# Patient Record
Sex: Male | Born: 1945
Health system: Southern US, Community
[De-identification: ages and names within clinical notes are randomized; demographics above are authoritative.]

## PROBLEM LIST (undated history)

## (undated) DIAGNOSIS — F329 Major depressive disorder, single episode, unspecified: Secondary | ICD-10-CM

## (undated) DIAGNOSIS — K635 Polyp of colon: Secondary | ICD-10-CM

## (undated) DIAGNOSIS — G4730 Sleep apnea, unspecified: Secondary | ICD-10-CM

## (undated) DIAGNOSIS — K5790 Diverticulosis of intestine, part unspecified, without perforation or abscess without bleeding: Secondary | ICD-10-CM

## (undated) DIAGNOSIS — I872 Venous insufficiency (chronic) (peripheral): Secondary | ICD-10-CM

## (undated) DIAGNOSIS — F32A Depression, unspecified: Secondary | ICD-10-CM

## (undated) DIAGNOSIS — I10 Essential (primary) hypertension: Secondary | ICD-10-CM

## (undated) DIAGNOSIS — I890 Lymphedema, not elsewhere classified: Secondary | ICD-10-CM

## (undated) DIAGNOSIS — I8390 Asymptomatic varicose veins of unspecified lower extremity: Secondary | ICD-10-CM

## (undated) DIAGNOSIS — D649 Anemia, unspecified: Secondary | ICD-10-CM

## (undated) DIAGNOSIS — K529 Noninfective gastroenteritis and colitis, unspecified: Secondary | ICD-10-CM

## (undated) DIAGNOSIS — K2970 Gastritis, unspecified, without bleeding: Secondary | ICD-10-CM

## (undated) DIAGNOSIS — K219 Gastro-esophageal reflux disease without esophagitis: Secondary | ICD-10-CM

## (undated) DIAGNOSIS — M1990 Unspecified osteoarthritis, unspecified site: Secondary | ICD-10-CM

## (undated) DIAGNOSIS — F250 Schizoaffective disorder, bipolar type: Secondary | ICD-10-CM

## (undated) DIAGNOSIS — M879 Osteonecrosis, unspecified: Secondary | ICD-10-CM

## (undated) DIAGNOSIS — M545 Low back pain, unspecified: Secondary | ICD-10-CM

## (undated) DIAGNOSIS — Z8719 Personal history of other diseases of the digestive system: Secondary | ICD-10-CM

## (undated) DIAGNOSIS — E611 Iron deficiency: Secondary | ICD-10-CM

## (undated) DIAGNOSIS — I82419 Acute embolism and thrombosis of unspecified femoral vein: Secondary | ICD-10-CM

## (undated) DIAGNOSIS — C801 Malignant (primary) neoplasm, unspecified: Secondary | ICD-10-CM

## (undated) DIAGNOSIS — E785 Hyperlipidemia, unspecified: Secondary | ICD-10-CM

## (undated) HISTORY — DX: Noninfective gastroenteritis and colitis, unspecified: K52.9

## (undated) HISTORY — DX: Personal history of other diseases of the digestive system: Z87.19

## (undated) HISTORY — DX: Sleep apnea, unspecified: G47.30

## (undated) HISTORY — DX: Major depressive disorder, single episode, unspecified: F32.9

## (undated) HISTORY — PX: TOTAL KNEE ARTHROPLASTY: SHX125

## (undated) HISTORY — DX: Low back pain, unspecified: M54.50

## (undated) HISTORY — DX: Gastro-esophageal reflux disease without esophagitis: K21.9

## (undated) HISTORY — DX: Depression, unspecified: F32.A

## (undated) HISTORY — DX: Unspecified osteoarthritis, unspecified site: M19.90

## (undated) HISTORY — DX: Anemia, unspecified: D64.9

## (undated) HISTORY — DX: Diverticulosis of intestine, part unspecified, without perforation or abscess without bleeding: K57.90

## (undated) HISTORY — DX: Osteonecrosis, unspecified: M87.9

## (undated) HISTORY — DX: Iron deficiency: E61.1

## (undated) HISTORY — PX: BLADDER SURGERY: SHX569

## (undated) HISTORY — DX: Acute embolism and thrombosis of unspecified femoral vein: I82.419

## (undated) HISTORY — DX: Polyp of colon: K63.5

## (undated) HISTORY — DX: Low back pain: M54.5

## (undated) HISTORY — DX: Schizoaffective disorder, bipolar type: F25.0

## (undated) HISTORY — DX: Gastritis, unspecified, without bleeding: K29.70

## (undated) HISTORY — DX: Hyperlipidemia, unspecified: E78.5

## (undated) HISTORY — DX: Essential (primary) hypertension: I10

## (undated) HISTORY — PX: VARICOSE VEIN SURGERY: SHX832

---

## 1998-06-02 ENCOUNTER — Encounter: Payer: Self-pay | Admitting: Urology

## 1998-06-05 ENCOUNTER — Ambulatory Visit (HOSPITAL_COMMUNITY): Admission: RE | Admit: 1998-06-05 | Discharge: 1998-06-05 | Payer: Self-pay | Admitting: Urology

## 1999-02-02 ENCOUNTER — Ambulatory Visit (HOSPITAL_COMMUNITY): Admission: RE | Admit: 1999-02-02 | Discharge: 1999-02-02 | Payer: Self-pay | Admitting: Urology

## 2000-05-08 ENCOUNTER — Ambulatory Visit (HOSPITAL_BASED_OUTPATIENT_CLINIC_OR_DEPARTMENT_OTHER): Admission: RE | Admit: 2000-05-08 | Discharge: 2000-05-08 | Payer: Self-pay | Admitting: Family Medicine

## 2000-08-22 ENCOUNTER — Encounter: Payer: Self-pay | Admitting: Urology

## 2000-08-25 ENCOUNTER — Ambulatory Visit (HOSPITAL_COMMUNITY): Admission: RE | Admit: 2000-08-25 | Discharge: 2000-08-25 | Payer: Self-pay | Admitting: Urology

## 2000-08-25 ENCOUNTER — Encounter (INDEPENDENT_AMBULATORY_CARE_PROVIDER_SITE_OTHER): Payer: Self-pay

## 2001-11-25 ENCOUNTER — Ambulatory Visit (HOSPITAL_BASED_OUTPATIENT_CLINIC_OR_DEPARTMENT_OTHER): Admission: RE | Admit: 2001-11-25 | Discharge: 2001-11-25 | Payer: Self-pay | Admitting: Family Medicine

## 2002-01-04 ENCOUNTER — Encounter: Payer: Self-pay | Admitting: Urology

## 2002-01-08 ENCOUNTER — Observation Stay (HOSPITAL_COMMUNITY): Admission: RE | Admit: 2002-01-08 | Discharge: 2002-01-09 | Payer: Self-pay | Admitting: Urology

## 2002-01-08 ENCOUNTER — Encounter (INDEPENDENT_AMBULATORY_CARE_PROVIDER_SITE_OTHER): Payer: Self-pay | Admitting: *Deleted

## 2002-02-12 ENCOUNTER — Ambulatory Visit (HOSPITAL_COMMUNITY): Admission: RE | Admit: 2002-02-12 | Discharge: 2002-02-12 | Payer: Self-pay | Admitting: *Deleted

## 2002-05-19 ENCOUNTER — Emergency Department (HOSPITAL_COMMUNITY): Admission: EM | Admit: 2002-05-19 | Discharge: 2002-05-19 | Payer: Self-pay | Admitting: *Deleted

## 2002-05-19 ENCOUNTER — Ambulatory Visit (HOSPITAL_COMMUNITY): Admission: RE | Admit: 2002-05-19 | Discharge: 2002-05-19 | Payer: Self-pay | Admitting: *Deleted

## 2002-05-20 ENCOUNTER — Ambulatory Visit (HOSPITAL_COMMUNITY): Admission: RE | Admit: 2002-05-20 | Discharge: 2002-05-20 | Payer: Self-pay | Admitting: Family Medicine

## 2002-05-28 ENCOUNTER — Ambulatory Visit (HOSPITAL_COMMUNITY): Admission: RE | Admit: 2002-05-28 | Discharge: 2002-05-28 | Payer: Self-pay | Admitting: Cardiology

## 2002-06-08 ENCOUNTER — Ambulatory Visit (HOSPITAL_COMMUNITY): Admission: RE | Admit: 2002-06-08 | Discharge: 2002-06-08 | Payer: Self-pay | Admitting: Cardiology

## 2002-10-04 ENCOUNTER — Inpatient Hospital Stay (HOSPITAL_COMMUNITY): Admission: RE | Admit: 2002-10-04 | Discharge: 2002-10-11 | Payer: Self-pay | Admitting: Orthopedic Surgery

## 2002-11-04 ENCOUNTER — Encounter: Payer: Self-pay | Admitting: Urology

## 2002-11-04 ENCOUNTER — Encounter: Admission: RE | Admit: 2002-11-04 | Discharge: 2002-11-04 | Payer: Self-pay | Admitting: Urology

## 2002-11-16 ENCOUNTER — Ambulatory Visit (HOSPITAL_COMMUNITY): Admission: RE | Admit: 2002-11-16 | Discharge: 2002-11-16 | Payer: Self-pay | Admitting: Gastroenterology

## 2002-11-16 ENCOUNTER — Encounter (INDEPENDENT_AMBULATORY_CARE_PROVIDER_SITE_OTHER): Payer: Self-pay | Admitting: Specialist

## 2002-12-03 ENCOUNTER — Ambulatory Visit (HOSPITAL_COMMUNITY): Admission: RE | Admit: 2002-12-03 | Discharge: 2002-12-03 | Payer: Self-pay | Admitting: Urology

## 2002-12-03 ENCOUNTER — Encounter: Payer: Self-pay | Admitting: Urology

## 2002-12-21 ENCOUNTER — Encounter: Admission: RE | Admit: 2002-12-21 | Discharge: 2002-12-21 | Payer: Self-pay | Admitting: Infectious Diseases

## 2003-01-04 ENCOUNTER — Encounter: Payer: Self-pay | Admitting: Infectious Diseases

## 2003-01-04 ENCOUNTER — Ambulatory Visit (HOSPITAL_COMMUNITY): Admission: RE | Admit: 2003-01-04 | Discharge: 2003-01-04 | Payer: Self-pay | Admitting: Infectious Diseases

## 2003-01-11 ENCOUNTER — Encounter: Admission: RE | Admit: 2003-01-11 | Discharge: 2003-01-11 | Payer: Self-pay | Admitting: Infectious Diseases

## 2003-03-14 ENCOUNTER — Encounter: Admission: RE | Admit: 2003-03-14 | Discharge: 2003-03-14 | Payer: Self-pay | Admitting: Infectious Diseases

## 2003-04-05 ENCOUNTER — Ambulatory Visit (HOSPITAL_COMMUNITY): Admission: RE | Admit: 2003-04-05 | Discharge: 2003-04-05 | Payer: Self-pay | Admitting: Infectious Diseases

## 2003-04-05 ENCOUNTER — Encounter: Payer: Self-pay | Admitting: Infectious Diseases

## 2003-04-20 ENCOUNTER — Encounter: Admission: RE | Admit: 2003-04-20 | Discharge: 2003-04-20 | Payer: Self-pay | Admitting: Infectious Diseases

## 2003-05-16 ENCOUNTER — Encounter: Admission: RE | Admit: 2003-05-16 | Discharge: 2003-05-16 | Payer: Self-pay | Admitting: Infectious Diseases

## 2003-06-06 ENCOUNTER — Ambulatory Visit (HOSPITAL_COMMUNITY): Admission: RE | Admit: 2003-06-06 | Discharge: 2003-06-06 | Payer: Self-pay | Admitting: Neurosurgery

## 2003-06-20 ENCOUNTER — Encounter: Admission: RE | Admit: 2003-06-20 | Discharge: 2003-06-20 | Payer: Self-pay | Admitting: Infectious Diseases

## 2003-08-03 ENCOUNTER — Ambulatory Visit (HOSPITAL_COMMUNITY): Admission: RE | Admit: 2003-08-03 | Discharge: 2003-08-03 | Payer: Self-pay | Admitting: Neurosurgery

## 2003-09-26 ENCOUNTER — Encounter: Admission: RE | Admit: 2003-09-26 | Discharge: 2003-09-26 | Payer: Self-pay | Admitting: Infectious Diseases

## 2003-12-05 ENCOUNTER — Ambulatory Visit (HOSPITAL_COMMUNITY): Admission: RE | Admit: 2003-12-05 | Discharge: 2003-12-05 | Payer: Self-pay | Admitting: Neurosurgery

## 2003-12-12 ENCOUNTER — Encounter (INDEPENDENT_AMBULATORY_CARE_PROVIDER_SITE_OTHER): Payer: Self-pay | Admitting: Specialist

## 2003-12-12 ENCOUNTER — Ambulatory Visit (HOSPITAL_COMMUNITY): Admission: RE | Admit: 2003-12-12 | Discharge: 2003-12-12 | Payer: Self-pay | Admitting: Urology

## 2003-12-26 ENCOUNTER — Encounter: Admission: RE | Admit: 2003-12-26 | Discharge: 2003-12-26 | Payer: Self-pay | Admitting: Infectious Diseases

## 2004-05-31 ENCOUNTER — Ambulatory Visit (HOSPITAL_COMMUNITY): Admission: RE | Admit: 2004-05-31 | Discharge: 2004-05-31 | Payer: Self-pay | Admitting: Neurosurgery

## 2006-11-27 ENCOUNTER — Emergency Department (HOSPITAL_COMMUNITY): Admission: EM | Admit: 2006-11-27 | Discharge: 2006-11-27 | Payer: Self-pay | Admitting: Emergency Medicine

## 2006-12-24 ENCOUNTER — Ambulatory Visit (HOSPITAL_COMMUNITY): Admission: RE | Admit: 2006-12-24 | Discharge: 2006-12-24 | Payer: Self-pay | Admitting: Gastroenterology

## 2009-03-24 ENCOUNTER — Encounter: Payer: Self-pay | Admitting: Urology

## 2009-03-24 ENCOUNTER — Ambulatory Visit (HOSPITAL_COMMUNITY): Admission: RE | Admit: 2009-03-24 | Discharge: 2009-03-24 | Payer: Self-pay | Admitting: Urology

## 2009-03-29 ENCOUNTER — Emergency Department (HOSPITAL_COMMUNITY): Admission: EM | Admit: 2009-03-29 | Discharge: 2009-03-29 | Payer: Self-pay | Admitting: Emergency Medicine

## 2009-03-30 ENCOUNTER — Inpatient Hospital Stay (HOSPITAL_COMMUNITY): Admission: AD | Admit: 2009-03-30 | Discharge: 2009-04-02 | Payer: Self-pay | Admitting: Urology

## 2009-07-05 ENCOUNTER — Ambulatory Visit (HOSPITAL_COMMUNITY): Admission: RE | Admit: 2009-07-05 | Discharge: 2009-07-05 | Payer: Self-pay | Admitting: Urology

## 2009-11-14 ENCOUNTER — Encounter: Admission: RE | Admit: 2009-11-14 | Discharge: 2010-02-12 | Payer: Self-pay | Admitting: Internal Medicine

## 2010-03-21 ENCOUNTER — Encounter: Admission: RE | Admit: 2010-03-21 | Discharge: 2010-06-04 | Payer: Self-pay | Admitting: Internal Medicine

## 2010-10-23 LAB — CBC
MCHC: 32.4 g/dL (ref 30.0–36.0)
MCV: 84.4 fL (ref 78.0–100.0)
Platelets: 216 10*3/uL (ref 150–400)
WBC: 6.7 10*3/uL (ref 4.0–10.5)

## 2010-10-26 LAB — BASIC METABOLIC PANEL
Chloride: 99 mEq/L (ref 96–112)
Creatinine, Ser: 0.72 mg/dL (ref 0.4–1.5)
GFR calc Af Amer: >60 mL/min (ref >60)
Potassium: 4 mEq/L (ref 3.5–5.1)
Sodium: 132 mEq/L — ABNORMAL LOW (ref 135–145)

## 2010-10-26 LAB — URINE MICROSCOPIC-ADD ON

## 2010-10-26 LAB — CBC
HCT: 23 % — ABNORMAL LOW (ref 39.0–52.0)
HCT: 26.3 % — ABNORMAL LOW (ref 39.0–52.0)
HCT: 28.8 % — ABNORMAL LOW (ref 39.0–52.0)
Hemoglobin: 8.8 g/dL — ABNORMAL LOW (ref 13.0–17.0)
MCHC: 33.2 g/dL (ref 30.0–36.0)
MCV: 85.1 fL (ref 78.0–100.0)
MCV: 85.9 fL (ref 78.0–100.0)
MCV: 86.8 fL (ref 78.0–100.0)
Platelets: 198 10*3/uL (ref 150–400)
Platelets: 220 10*3/uL (ref 150–400)
RBC: 3.09 MIL/uL — ABNORMAL LOW (ref 4.22–5.81)
RBC: 3.31 MIL/uL — ABNORMAL LOW (ref 4.22–5.81)
WBC: 6.1 10*3/uL (ref 4.0–10.5)
WBC: 7 10*3/uL (ref 4.0–10.5)
WBC: 9.3 10*3/uL (ref 4.0–10.5)

## 2010-10-26 LAB — URINALYSIS, ROUTINE W REFLEX MICROSCOPIC
Ketones, ur: 40 mg/dL — AB
Nitrite: POSITIVE — AB
Protein, ur: >300 mg/dL — AB

## 2010-10-26 LAB — URINE CULTURE

## 2010-10-26 LAB — CROSSMATCH

## 2010-10-27 LAB — BASIC METABOLIC PANEL
BUN: 23 mg/dL (ref 6–23)
Chloride: 104 mEq/L (ref 96–112)
Glucose, Bld: 84 mg/dL (ref 70–99)
Potassium: 4 mEq/L (ref 3.5–5.1)

## 2010-12-07 NOTE — Discharge Summary (Signed)
NAME:  Shaun Yoder, Shaun Yoder NO.:  [id number]   MEDICAL RECORD NO.:  [id number]                   PATIENT TYPE:  INP   LOCATION:  5029                                 FACILITY:  [facility]   PHYSICIAN:  Mila Homer. Sherlean Foot, M.D.              DATE OF BIRTH:  Jun 30, 1946   DATE OF ADMISSION:  10/04/2002  DATE OF DISCHARGE:  10/11/2002                                 DISCHARGE SUMMARY   ADMISSION DIAGNOSES:  1. End-stage osteoarthritis, medial compartment of left knee.  2. Hypertension.  3. Paranoid schizophrenia, under good control.  4. Sleep apnea and treated with continuous positive airway pressure.  5. Chronic peripheral edema.  6. Urinary frequency and urgency.  7. Obesity.  8. History of tobacco abuse.   DISCHARGE DIAGNOSES:  1. End-stage osteoarthritis, medial compartment, left knee, status post left     total knee arthroplasty.  2. Acute blood loss anemia secondary to surgery.  3. Postoperative anemia due to medications, now resolved.  4. Mild leukocytosis.  5. Hypertension.  6. Paranoid schizophrenia, under good control.  7. Sleep apnea, treated with continuous positive airway pressure.  8. Chronic peripheral vascular disease with chronic edema.  9. Urinary frequency and urgency.  10.      Obesity.  11.      History of tobacco abuse.   SURGICAL PROCEDURE:  On 3/115/2004, the patient underwent a left total knee  arthroplasty by Dr. Mila Homer. Lucey, assisted by Legrand Pitts Duffy, PA-C.  He  had a Nex Gen complete knee solution, tibial half-wedge Precoat size 5, 26  degrees and 17-mm height placed with a Nex Gen complete knee solution stem  extension, straight, 15-mm diameter, 30-mm length, then a Nex Gen all-  polyethylene patella, standard size, 35-mm diameter, 9-mm thickness, with a  Nex Gen complete knee solution stem tibial component, Precoat, size 5, and  then a Legacy knee posterior stabilized femoral component, size F-left, and  a Legacy knee  posterior stabilized articular surface site, green EF, 10-mm  height.   COMPLICATIONS:  None.   CONSULTS:  1. Case Management, Physical Therapy, and Rehab Medicine consults,     10/05/2002.  2. Occupational Therapy consult, 10/06/2002.   HISTORY OF PRESENT ILLNESS:  This 65 year old white male patient presented  to Dr. Sherlean Foot with a one-year history of gradual-onset, progressive left knee  pain.  No known injury to the knee.  The knee pain is constant, stabbing or  throbbing over the joint line without radiation.  It increased with extremes  of range of motion and if he stands for a prolonged period of time.  Medications provide minimal relief of his pain.  He complains of stiffness,  popping and swelling.  His ambulation has been markedly limited due to the  pain in his knee and because of failure of conservative treatment, he is  presenting for a left total knee arthroplasty.   HOSPITAL COURSE:  The  patient tolerated his surgical procedure well without  immediate postoperative complications.  He was transferred to 5000.  On  postop day 1, T-max was 100.7 and vitals were stable.  He had some  difficulty with nausea and medications were adjusted.  He was started on  therapy per protocol.   Postop day 2, T-max 100, vitals were stable.  The leg was neurovascularly  intact.  Hemoglobin was 9.1, hematocrit 26.  He was continued with the Jobst  stockings for his edema and started on oral medications for pain.   On postop day 3, hemoglobin dropped to 7.7 with an hematocrit of 22.1.  T-  max was 97.3 and BP was 109/20.  He was transfused with two units of packed  red blood cells.  He tolerated that well.  He was continued on therapy.   Over the next several days, he continued to do well.  His hemoglobin  remained low, but he was basically asymptomatic with that.  It was felt on  10/11/2002, that he was ready for discharge home.  Pain was well-controlled  at that time, he was ambulating  well, hemoglobin was 8.8, hematocrit 25.2  and white count 11.1.  He was discharged home later that day.   DISCHARGE INSTRUCTIONS:   DIET:  He can resume his regular pre-hospitalization diet.   MEDICATIONS:  He may resume his pre-hospitalization medications except for  the aspirin; these medications include:  1. Norvasc 10 mg p.o. daily.  2. Accupril 10 mg p.o. daily.  3. Risperdal 3 mg -- two tablets p.o. q.h.s.  4. Depakote-NA 500 mg two tabs p.o. q.h.s.  5. Eskalith-CR 450 mg one tab p.o. q.a.m. and one and a half tabs p.o.     q.h.s.  6. Lorazepam 1 mg p.o. q.a.m.  7. Wellbutrin-SR 100 mcg two tablets p.o. q.a.m. and one tab p.o. q.h.s.  8. Oxytrol transdermal patch - one patch every four days, on Tuesdays and     Saturdays.  9. Flomax 0.4 mg p.o. q.p.m.  10.      Percocet 7.5/325 mg one tablet p.o. q.4-6h. p.r.n. for pain.   Additional medications include:  1. Lovenox 40 mg subcu daily, with the last dose to be 10/18/2002.  2. Percocet 5/325 mg one to two p.o. q.4h. p.r.n. for pain, 50 with no     refill, needs to take either these or his normal Percocet for pain but     not both at the same time.   ACTIVITY:  He is to be out of bed weightbearing as tolerated on the left leg  with the use of a walker.  He is to arrange for home health PT per [place].  He is also arranged for home health R.N. to draw blood work - a CBC - on  Wednesday and the results are to be faxed to our office.   WOUND CARE:  All wound care, activity and special instructions are provided  to the patient via the preprinted blue total knee discharge sheet.  He is to  notify Dr. Sherlean Foot of temperature greater than 101.5, chills, pain unrelieved  by pain medications or foul-smelling drainage from the wound.  He can shower  after no drainage from the wound for two days.   FOLLOWUP:  He is to follow up with Dr. Sherlean Foot in our office in approximately seven days and is to call [phone] for that appointment.    LABORATORY DATA:  On 10/05/2002, hemoglobin 11.3, hematocrit 32.2.  On the  17th, hemoglobin 9.1, hematocrit 26.  On the 18th, hemoglobin 7.5,  hematocrit 21.5.  On 10/08/2002, hemoglobin 9.2, hematocrit 26.5.  On the  21st, hemoglobin 8.2, hematocrit 23.4 and on 10/11/2002, white count 11.1,  hemoglobin 8.8, hematocrit 25.2 and platelets 317,000.   On 10/05/2002, glucose 151 and on the 17th, it was 126.  All other laboratory  studies were within normal limits.     Legrand Pitts Duffy, P.A.                      Mila Homer. Sherlean Foot, M.D.    KED/MEDQ  D:  11/25/2002  T:  11/26/2002  Job:  [id number]

## 2010-12-07 NOTE — Op Note (Signed)
[facility]  Patient:    Shaun Yoder, Shaun Yoder Visit Number: [id number] MRN: [id number]          Service Type: SUR Location: 3W 0343 02 Attending Physician:  Liborio Nixon Dictated by:   Bertram Millard. Dahlstedt, M.D. Proc. Date: 01/08/02 Admit Date:  01/08/2002 Discharge Date: 01/09/2002   CC:         Caryn Bee L. Little, M.D.   Operative Report  PREOPERATIVE DIAGNOSIS:  1 cm bladder tumor on dome of bladder.  POSTOPERATIVE DIAGNOSIS:  1 cm bladder tumor on dome of bladder.  PRINCIPAL PROCEDURE:  Transurethral resection of 10 mm bladder tumor.  SURGEON:  Bertram Millard. Dahlstedt, M.D.  ANESTHESIA:  General endotracheal.  COMPLICATIONS:  None.  BRIEF HISTORY:  This 65 year old male has a history of recurrent bladder tumors. Despite his recurrences and the need for repeat TURBTs, he has not stopped smoking at my request. He has recently been diagnosed with a recurrent tumor. He presents at this time for TURBT.  DESCRIPTION OF PROCEDURE:  The patient was administered a general endotracheal anesthetic after unsuccessful attempts at a spinal. He was placed in the dorsal lithotomy position. Genitalia and perineum were prepped and draped. His urethral meatus was calibrated at a 22 [place] with [employer]. The 25 French panendoscope was advanced into his bladder. His bladder was normal except for a 10 mm tumor at the right dome. This was grasped three separate times with cold cut biopsy forceps and removed totally. Tissue underlying the tumor area was also biopsied. In addition, a separate biopsy labeled "random bladder biopsy" from the posterior trigone area was also sent. These biopsy sites were cauterized. There was no bleeding after this. I did not leave the catheter in. The scope was removed after the bladder was drained.  The patient tolerated the procedure well. He was awakened, extubated, and taken the PACU in stable condition. He will  be monitored in the hospital overnight due to his sleep apnea. Dictated by:   Bertram Millard. Dahlstedt, M.D. Attending Physician:  Liborio Nixon DD:  01/08/02 TD:  01/10/02 Job: [id number]

## 2010-12-07 NOTE — Op Note (Signed)
NAME:  Shaun Yoder, Shaun Yoder NO.:  [id number]   MEDICAL RECORD NO.:  [id number]                   PATIENT TYPE:  AMB   LOCATION:  DAY                                  FACILITY:  [facility]   PHYSICIAN:  Bertram Millard. Dahlstedt, M.D.          DATE OF BIRTH:  05/30/1946   DATE OF PROCEDURE:  12/12/2003  DATE OF DISCHARGE:                                 OPERATIVE REPORT   PREOPERATIVE DIAGNOSIS:  History of recurrent transitional cell carcinoma of  the bladder, with recent positive cytology.   POSTOPERATIVE DIAGNOSIS:  History of recurrent transitional cell carcinoma  of the bladder, with recent positive cytology; without evident recurrence.   SURGEON:  Bertram Millard. Dahlstedt, M.D.   OPERATIVE PROCEDURE:  1. Cystoscopy.  2. Bladdery biopsy.  3. Bilateral retrograde ureteral pyelogram.  4. Interpretive fluoroscopy.   ANESTHESIA:  General.   COMPLICATIONS:  None.   BRIEF HISTORY:  This 65 year old male has a history of transitional cell  carcinoma of the bladder.  His last resection was approximately two years  ago.  He was recently seen in the office, where he had two minimally  erythematous lesions at his bladder neck on the right.  NMP-22 test returned  positive, although he did not have any discrete papillary lesions.   At this point, I recommended cystoscopy, bladder biopsy and bilateral  retrograde pyelograms with possible ureteroscopy.  The patient has been  counseled on the risks and complications of the procedure.  He understands  these and desires to proceed.   DESCRIPTION OF PROCEDURE:  The patient was administered a general  anesthetic.  He was placed in the dorsal lithotomy position.  Genitalia and  perineum were prepped and draped.  A 22-French panendoscope was passed  through his urethra, which was normal.  Prostate was minimally obstructed.  There were no lesions within the urethra.  The bladder was inspected  circumferentially.  There were two  small erythematous areas at the bladder  neck on the right, which actually did not look that foreboding.  These were  biopsied.  Additionally, one other biopsy was taken in the mid bladder  posteriorly, where some telangiectatic vessels appeared.  These biopsy sites  were coagulated with the Bugbee electrode.   Bilateral retrograde ureteral pyelograms were performed here.  This revealed  normal findings of the ureters and the renal calyces, and the pelves.  I did  not sent cytologies from the ureters.  At this point, the bladder was  drained and the procedure terminated.   The patient tolerated the procedure well.  He was awakened and taken to the  PACU in stable condition.                                               Bertram Millard. Dahlstedt, M.D.  SMD/MEDQ  D:  12/12/2003  T:  12/12/2003  Job:  [id number]

## 2010-12-07 NOTE — Op Note (Signed)
   NAME:  Shaun Yoder, Shaun Yoder                      ACCOUNT NO.:  [id number]   MEDICAL RECORD NO.:  [id number]                   PATIENT TYPE:  AMB   LOCATION:  ENDO                                 FACILITY:  [facility]   PHYSICIAN:  John C. Madilyn Fireman, M.D.                 DATE OF BIRTH:  [birth year]   DATE OF PROCEDURE:  11/16/2002  DATE OF DISCHARGE:                                 OPERATIVE REPORT   PROCEDURE:  Esophagogastroduodenoscopy with biopsy.   INDICATION FOR PROCEDURE:  Abdominal pain, diarrhea, and heme-positive  stool.   DESCRIPTION OF PROCEDURE:  The patient was placed in the left lateral  decubitus position and placed on the pulse monitor with continuous low-flow  oxygen delivered by nasal cannula.  He was sedated with 2 mg IV Versed in  addition to medicines given for the previous EGD.  The Olympus video  endoscope was advanced under direct vision into the oropharynx and  esophagus.  The esophagus was straight and of normal caliber with the  squamocolumnar line at 38 cm.  There was no visible hiatal hernia, ring,  stricture, or other abnormality of the GE junction.  The stomach was entered  and a small amount of liquid secretions was suctioned from the fundus.  Retroflexed view of the cardia was unremarkable.  The fundus, body, antrum,  and pylorus all appeared normal.  The duodenum was entered and both the bulb  and second portion were well-inspected and appeared to be within normal  limits.  The scope was advanced as far as possible down the duodenum and  biopsies were obtained to rule out celiac disease.  The scope was then  withdrawn and the patient returned to the recovery room in stable condition.  He tolerated the procedure well, and there were no immediate complications.   IMPRESSION:  Normal endoscopy.   PLAN:  Await biopsies from duodenum and from colon as well.                                               John C. Madilyn Fireman, M.D.    JCH/MEDQ  D:  11/16/2002  T:   11/17/2002  Job:  [id number]   cc:   Caryn Bee L. Little, M.D.  [address]  [place]  [place] [address]  Fax: [phone]

## 2010-12-07 NOTE — Op Note (Signed)
NAME:  Shaun Yoder, Shaun Yoder                      ACCOUNT NO.:  [id number]   MEDICAL RECORD NO.:  [id number]                   PATIENT TYPE:  INP   LOCATION:  2550                                 FACILITY:  [facility]   PHYSICIAN:  Mila Homer. Sherlean Foot, M.D.              DATE OF BIRTH:  Shaun Yoder, Shaun Yoder   DATE OF PROCEDURE:  10/04/2002  DATE OF DISCHARGE:                                 OPERATIVE REPORT   PREOPERATIVE DIAGNOSIS:  Severe left osteoarthritis of the knee.   POSTOPERATIVE DIAGNOSIS:  Severe left osteoarthritis of the knee.   PROCEDURE:  Left total knee arthroplasty (complicated).   SURGEON:  Mila Homer. Sherlean Foot, M.D.   ASSISTANT:  Legrand Pitts. Duffy, P.A.   ANESTHESIA:  General.   INDICATION FOR PROCEDURE:  The patient is an elderly white male with failure  of conservative measures for osteoarthritis of the knee.  Preoperative  medical clearance was obtained as well as informed consent.   DESCRIPTION OF PROCEDURE:  The patient was laid supine and administered  general anesthesia.  The left lower extremity was prepped and draped in the  usual sterile fashion.  A #10 blade was used to make a median skin of  midline incision approximately 10 inches in length.  A fresh blade was used  to make a median parapatellar arthrotomy and perform a synovectomy.  I then  everted the patella, measured it at 25 mm, and then used the 32 mm  reamer  to ream down to 16 mm.  I removed all excess bone and drilled three lug  holes.  With the prosthetic trial in place, it also measured 25 mm.  I then  developed a subperiosteal sleeve of tissue off the medial crest of the  tibia.  There was extraordinary bone loss on the tibial side with at least a  25 degree varus deformity, so I performed an extensive varus release on the  medial side on the tibia.  I then went into flexion and made an  intramedullary hole into the tibial canal, used a canal finder and a size 10  reamer.  I then set up the intramedullary  guide to remove a 26 degree angled  wedge to accommodate for the bone loss and then a perpendicular portion,  removing approximately 2 mm of bone off the lateral tibial plateau.  Once  this was done, I then removed the guides and turned attention to the femur.  I made an intramedullary hole in the femur in the femoral canal.  I then  placed an intramedullary guide set on 6 degree valgus cut, at this point  pinned our distal femoral cutting block in place, and made our distal  femoral cut.  I sized to a size F, placed the F four-in-one cutting block in  place set at 3 degrees of external rotation, and I then made the anterior,  posterior, and chamfer cuts.  I then placed a laminar spreader  in the knee  and then removed the medial and lateral menisci, posterior condylar  osteophytes, ACL, and PCL.  With a 10 mm spacer block in place and  accommodating for the wedge, we had excellent flexion-extension gap balance.  I therefore finished the tibia with a size 5 tibial tray, finished the femur  with a size F femoral guide, cutting the notch and drilling the lug holes.  I then used a size 10 poly insert and had excellent flexion-extension gap  balance, excellent flexion and extension, and at this point I removed all  the components, copiously irrigated, and then cemented in a size 5 tibia  with a 26 mm wedge and a stubby stem attachment.  I also cemented in a  size D femoral component.  I put in the real 10 mm polyethylene insert,  located the knee, put it in extension, and then placed a 32 mm patella and  cemented it on with the patella clamp.  Once the cement was hard, I then let  the tourniquet down, cauterized all bleeding vessels, and  closed wit interrupted #1 Vicryl, interrupted 0 Vicryl, running 2-0 Vicryl,  and skin staples.  I dressed with Adaptic, 4 x 4's, sterile Webril, and an  Ace wrap.  Complications:  None.  EBL:  500 mL.  Drains:  One Hemovac.  Tourniquet time:  One hour 13  minutes.                                               Mila Homer. Sherlean Foot, M.D.    SDL/MEDQ  D:  10/04/2002  T:  10/04/2002  Job:  [id number]

## 2010-12-07 NOTE — Op Note (Signed)
   NAME:  Shaun Yoder, Shaun Yoder                      ACCOUNT NO.:  [id number]   MEDICAL RECORD NO.:  [id number]                   PATIENT TYPE:  AMB   LOCATION:  ENDO                                 FACILITY:  [facility]   PHYSICIAN:  John C. Madilyn Fireman, M.D.                 DATE OF BIRTH:  11/26/1945   DATE OF PROCEDURE:  11/16/2002  DATE OF DISCHARGE:                                 OPERATIVE REPORT   PROCEDURE:  Colonoscopy.   INDICATION FOR PROCEDURE:  Abdominal pain, diarrhea, and anemia in a 65-year-  old patient with no recent colon screening.   DESCRIPTION OF PROCEDURE:  The patient was placed in the left lateral  decubitus position and placed on the pulse monitor with continuous low-flow  oxygen delivered by nasal cannula.  He was sedated with 75 mcg IV fentanyl  and 7 mg IV Versed.  The Olympus video colonoscope was inserted into the  rectum and advanced to the cecum, confirmed by transillumination at  McBurney's point and visualization of the ileocecal valve and appendiceal  orifice.  The prep was excellent.  The cecum, ascending, and transverse  colon all appeared normal with no masses, polyps, diverticula, or other  mucosal abnormalities.  Within the descending and sigmoid colon there were  seen several diverticula and no other abnormalities.  The rectum appeared  normal, and retroflexed view of the anus revealed no other abnormalities.  The scope was then withdrawn and the patient returned to the recovery room  in stable condition.  He tolerated the procedure well, and there were no  immediate complications.   IMPRESSION:  Diverticulosis, otherwise normal study.   PLAN:  Await biopsies to rule out collagenous colitis, and will proceed with  EGD for further evaluation of his abdominal pain and diarrhea and anemia.                                               John C. Madilyn Fireman, M.D.    JCH/MEDQ  D:  11/16/2002  T:  11/17/2002  Job:  [id number]   cc:   Caryn Bee L. Little, M.D.  [address]  [place]  [place] [address]  Fax: [phone]

## 2010-12-07 NOTE — H&P (Signed)
NAME:  Shaun Yoder, Shaun Yoder                      ACCOUNT NO.:  [id number]   MEDICAL RECORD NO.:  [id number]                   PATIENT TYPE:  INP   LOCATION:  NA                                   FACILITY:  [facility]   PHYSICIAN:  Mila Homer. Sherlean Foot, M.D.              DATE OF BIRTH:  11-10-1945   DATE OF ADMISSION:  10/04/2002  DATE OF DISCHARGE:                                HISTORY & PHYSICAL   CHIEF COMPLAINT:  Left knee pain for the last year.   HISTORY OF PRESENT ILLNESS:  This 65 year old white male patient presented  to Dr. Sherlean Foot as a referral from Dr. Wyline Mood with a one year history of  gradual onset of progressively worsening left knee pain.  He has had no  known injury or prior surgery to his knee.  At this point, the pain in his  knee is a constant stabbing or throbbing sensation located along the joint  line without radiation up or down his leg.  The pain increases with extremes  of extension, flexion, and if he stands for a prolonged period of time, and  it does decrease if he sits and elevates his leg.  He is also taking half of  a Percocet 7.5 mg tablet three times a day, and that provides some relief of  his pain.  He complains of stiffness in his knee and occasional popping and  some swelling in the knee, but no catching, grinding, locking, giving way,  or night pain.  Over the last three weeks, his ambulation has become quite  minimal, and he ambulates only a few feet with the use of a walker and  otherwise is pretty much wheelchair bound at this time.  He does have  problems with swelling in that left leg due to peripheral vascular disease,  and that is being treated right now with compressive stockings.  He does  have problems with swelling in his knee and a Baker's cyst.  He has had  problems with falling recently due to his knee pain and lack of balance.  He  has tried Synvisc injections and cortisone injections in the left knee in  the past, and that provided minimal  relief.   ALLERGIES:  No known drug allergies.   CURRENT MEDICATIONS:  1. Norvasc 10 mg one tablet p.o. daily.  2. Accupril 10 mg one tablet p.o. daily.  3. Risperdal 3 mg two tablets p.o. q.h.s.  4. Depakote NA 500 mg two tablets p.o. q.h.s.  5. Eskalith CR 450 mg one tablet p.o. q.a.m. and one-and-a-half tablets p.o.     q.h.s.  6. Lorazepam 1 mg one tablet p.o. q.a.m.  7. Wellbutrin SR 100 mg two tablets p.o. q.a.m. and one tablet p.o. q.h.s.  8. Aspirin 325 mg one tablet p.o. daily with the last dose 09/27/02.  9. Oxytrol transdermal patch, apply one patch every four days, and this is  applied on Tuesdays and Saturdays.  10.      Flomax 0.4 mg one tablet p.o. q.p.m.  11.      Percocet 7.5/325 mg one tablet p.o. q.4-6h. p.r.n. pain.   PAST MEDICAL HISTORY:  He has had hypertension for the last 20 years.  He  does have paranoid schizophrenia, and he has had that since the [place] War,  but that has been well under control.  His psychiatrist is Dr. Milagros Evener, and her phone number is [phone].  He does have bad peripheral  vascular disease which has been treated by Dr. Cari Caraway, and he has  also been evaluated from a cardiac standpoint by Dr. Armanda Magic.   He denies any history of diabetes mellitus, thyroid disease, hiatal hernia,  peptic ulcer disease, heart disease, asthma, or any other chronic medical  condition, other than noted previously.   PAST SURGICAL HISTORY:  1. Removal of bladder cyst, Dr. Retta Diones, 5/03.  2. Removal of bladder cyst, Dr. Retta Diones, 8/02.  3. Removal of bladder cyst, Dr. Retta Diones, 2001.  4. Excision of bladder cyst, Dr. Retta Diones, twice in the year 2000.  5. Excision of bladder cyst, Dr. Retta Diones, 1999.  6. Varicose vein stripping, 1972, in [place], [place].  (He denies complications from the above procedures.   SOCIAL HISTORY:  He does have a probably 80-90 pack year history of  cigarette smoking, which he quit a week ago.   For the last two years, he has  probably smoked 1-1/2 packs a day.  Prior to that, he did smoke 2-1/2 packs  a day.  He does not drink any alcohol nor use any drugs.  He is married and  has two children.  He, his wife, and his daughter, live in a 2-story house  with four steps into the main entrance.  The bedroom is on the second floor.  He has been retired the last two years from the [employer].  His  medical doctor is Dr. Catha Gosselin at [phone].  His cardiologist is Dr.  Armanda Magic.  His cardiovascular surgeon is Dr. Cari Caraway.  His  psychiatrist is Dr. Milagros Evener.  Her phone number is [phone].   FAMILY HISTORY:  His mother died at the age of 15 with peripheral vascular  disease and hypertension.  Father died at the age of 64 with a heart attack,  history of stroke, and hypertension.  He has one brother alive at age 74  with no major medical problems.   His children are 21 and 18, and they are adopted.   REVIEW OF SYSTEMS:  He does have problems with nasal polyps.  He wears  dentures on both the upper and lower jaw line.  He does use reading glasses.  He complains of an occasional green halo around an image when he is tired,  and also occasional diplopia.  He has dyspnea on exertion which is mild at  this time.  He does complain of occasional diarrhea.  He has sleep apnea,  and treats that with a CPAP machine.  He has multiple problems with his  urinary tract due to the multiple surgeries, and he does have urinary  frequency, urgency, nocturia, and he is thirsty quite a bit.  He has lost  about 10 pounds in the last month and a half.  He has a Living Will, and his  Power of Gerrit Friends is Williom Cedar, his wife.  All other systems are  negative and noncontributory.  PHYSICAL EXAMINATION:  GENERAL:  Well-developed, well-nourished, overweight  white male in no acute distress.  He comes into the exam room in a wheelchair accompanied by his wife.  Mood and affect  are appropriate.  He  talks easily with the examiner.  Height 5'8, weight 244 pounds.  EMI is 36.  VITAL SIGNS:  Temperature 97 degrees Fahrenheit, pulse 72, respirations 12,  and blood pressure 112/56.  HEENT:  Normocephalic and atraumatic without frontal or maxillary sinus  tenderness to palpation.  Conjunctivae are pink.  Sclerae are anicteric.  Pupils equal, round and reactive to light and accommodation.  Extraocular  muscles intact.  No visible external ear deformities.  Moderate cerumen in  the ear canals.  Tympanic membranes pearly gray bilaterally with good light  reflex.  Nose and nasal septum midline.  Nasal mucosa pink and moist without  exudates or polyps noted.  Buccal mucosa pink and moist.  Good dentition.  Pharynx without erythema or exudate.  Tongue and uvula midline.  Tongue  without fasciculations, and uvula rises equally with phonation.  NECK:  No visible masses or lesions noted.  Trachea midline.  No palpable  lymphadenopathy.  No thyromegaly.  Carotid pulses are +1 bilaterally.  Unable to auscultate if there are any bruits.  Full range of motion of the  cervical spine, and nontender to palpation along the cervical spine.  CARDIOVASCULAR:  Heart rate and rhythm regular.  S1 and S2 present without  rubs, clicks, or murmurs noted.  RESPIRATORY:  Respirations even and unlabored.  Breath sounds clear to  auscultation bilaterally without rales or wheezes noted.  ABDOMEN:  Rounded abdominal contour.  Bowel sounds present x4 quadrants.  Soft.  Nontender to palpation without hepatosplenomegaly.  There is CVA  tenderness.  Nontender to palpation along the entire length of the vertebral  column.  BREASTS/GU/RECTAL:  These exams deferred at this time.  MUSCULOSKELETAL:  He does have a tattoo noted on his right biceps.  No other  deformity bilateral upper extremities, other than severe dry skin on the  fingertips of both hands, left worse than right.  His left thumbnail is very   thickened and slightly malformed.  Again, extremely thick skin on the  fingers of his left hand more than his right.  Otherwise full range of  motion of bilateral upper extremities.  Normal muscle tone and bulk.  Radial  pulses +2 bilaterally.  Good range of motion of his hips, ankles, and toes  bilaterally.  DP pulses are +1 bilaterally.  Unable to palpate PT pulses.  He does have significant peripheral vascular discoloration noted on his  right lower extremity with an old healed area on the medial distal tibia.  He has full extension of his right knee with flexion to 120 degrees.  Minimal crepitus with range of motion of the right knee.  No pain with  palpation along the joint line.  No effusion.  Stable to varus and valgus  stress.  Significant brownish discoloration again on that right lower leg.  Leg leg is in a mid-thigh brown Jobst stocking.  Unable to visualize the skin at this time.  He has full extension and flexion only to 100 degrees  with a moderate amount of crepitus.  He has painless palpation on both the  medial and lateral joint line.  The knee does appear to be in about a 10-15  degree varus position.  Minimal effusion in the knee.  There was +1 to 2  pitting edema  bilateral lower extremities.  Again, DP pulse +1, and PT  unable to be palpated.  NEUROLOGIC:  Alert and oriented x3.  Cranial nerves II-XII are grossly  intact.  Strength 5/5 bilateral upper and lower extremities.  Deep tendon  reflexes 2+ bilateral upper extremities.  Lower extremities not assessed at  this time.   RADIOLOGIST FINDINGS:  X-rays brought with him from Dr. Lily Lovings office  in 12/03 show an extreme deformity of the left knee with degeneration of the  medial tibial plateau and approximately 15 degrees of varus.   IMPRESSION:  1. End-stage osteoarthritis medial compartment, left knee.  2. Hypertension.  3. Paranoid schizophrenia under good control with medications.  4. Sleep apnea treated  with CPAP machine.  5. Urinary frequency and urgency due to multiple surgeries due to bladder     cyst.  6. History of tobacco abuse, now quit.  7. Obesity.   PLAN:  Mr. Engram will be admitted to [facility] on 10/04/02 where  he will undergo a left total knee arthroplasty by Dr. Mila Homer. Lucey.  He  will undergo all the routine preoperative laboratory tests and studies prior  to this procedure.  If we have any medical issues while he is hospitalized,  we will consult Dr. Fredirick Maudlin group, or Dr. Gloris Manchester Turner's group.     Legrand Pitts Duffy, P.A.                      Mila Homer. Sherlean Foot, M.D.    KED/MEDQ  D:  09/27/2002  T:  09/27/2002  Job:  [id number]

## 2010-12-07 NOTE — Cardiovascular Report (Signed)
   NAME:  Shaun Yoder, Shaun Yoder NO.:  [id number]   MEDICAL RECORD NO.:  [id number]                   PATIENT TYPE:  OIB   LOCATION:  2899                                 FACILITY:  [facility]   PHYSICIAN:  Lesleigh Noe, M.D.            DATE OF BIRTH:  Jan 19, 1946   DATE OF PROCEDURE:  DATE OF DISCHARGE:                              CARDIAC CATHETERIZATION   INDICATIONS FOR PROCEDURE:  Abnormal Cardiolite suggesting inferobasal and  anteroapical fixed defect with peridefect ischemia.   PROCEDURE PERFORMED:  1. Left heart catheterization.  2. Coronary angiogram.  3. Left ventriculography.   DESCRIPTION OF PROCEDURE:  After informed consent, a 6 French sheath was  placed in the right femoral artery using a modified Seldinger technique. A 6  French A2 multipurpose catheter was used for hemodynamic recordings, left  ventriculography, and selective left and right coronary angiography.  The  patient tolerated the procedure without complications.   RESULTS:  I:  HEMODYNAMIC DATA:  a.  Aortic pressure 114/62.  b.  Left ventricular pressure 123/12 mmHg.   II:  LEFT VENTRICULOGRAPHY:  The left ventricle is normal in size and  demonstrates normal overall contractility, EF approximately 55%.   III:  CORONARY ANGIOGRAPHY:  a.  Left main coronary artery:  Normal.  b.  Left anterior descending coronary:  The LAD is large, gives origin to  four diagonal branches.  No significant obstruction was noted.  Distal  luminal irregularities are seen.  c.  Circumflex artery:  Circumflex artery is large.  It gives origin to  three obtuse marginal branches and the PDA.  The circumflex system is  normal.  d.  Right coronary:  The right coronary artery is nondominant.  It arises  anteriorly from the right sinus of Valsalva.  No obstruction was noted.   CONCLUSION:  1. Normal coronary arteries.  2. 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  3.     Normal left ventricular function.  4. False-positive Cardiolite.   RECOMMENDATIONS:  No further cardiac evaluation.                                                  Lesleigh Noe, M.D.    HWS/MEDQ  D:  06/08/2002  T:  06/08/2002  Job:  [id number]   cc:   Armanda Magic, M.D.  [address]  [place], [place] [address]  Fax: [phone]   Anna Genre. Little, M.D.  [address]  [place]  [place] [address]  Fax: [phone]

## 2010-12-07 NOTE — Op Note (Signed)
[facility]  Patient:    Shaun Yoder, Shaun Yoder                   MRN: [id number] Proc. Date: 08/25/00 Adm. Date:  [id number] Attending:  Liborio Nixon                           Operative Report  PREOPERATIVE DIAGNOSIS:  History of transitional cell carcinoma of the bladder with recurrent bladder tumors.  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  OPERATION:  Transurethral resection of bladder tumor, random bladder biopsies.  SURGEON:  Bertram Millard. Dahlstedt, M.D.  ANESTHESIA:  Subarachnoid block.  COMPLICATIONS:  None.  BRIEF HISTORY:  A 65 year old male with multiple recurrent bladder tumors.  He continues to smoke despite his recurrences and instructions on ceasing his smoking.  He was recently found, on followup, to have recurrent bladder tumor.  He is scheduled at this point for TURBT.  Risks and complications of the procedure are discussed with the patient and are well understood.  DESCRIPTION OF PROCEDURE:  The patient was administered a subarachnoid block and placed in the dorsolithotomy position.  Genitalia and perineum were prepped and draped.  Cystoscope was directed into the bladder.  There was one 1 cm recurrent tumor on the left bladder wall towards the dome.  No other bladder tumors were seen.  There were multiple telangiectatic vessels seen but no evidence of patches consistent with CIS.  The cold cup biopsy forceps were used to remove the bladder tumor on the sidewall.  Two to three bites were needed to remove this tumor.  It was taken down to the muscular layer. Several other areas of the bladder were biopsied in random fashion; specifically, one area of erythematous mucosa was present near the right ureteral orifice which was biopsied.  All other biopsies were sent as random bladder biopsies.  Bugbee electrode was used to carefully cauterize all  biopsy sites, using special care to cauterize around the right ureteral orifice.  No bleeding was seen at this point.  The two separate specimens were sent as bladder tumor and random bladder biopsies.  The bladder was drained and cystoscope removed.  The patient tolerated the procedure well and was returned to the PACU in stable condition. DD:  08/25/00 TD:  08/26/00 Job: [id number]

## 2011-06-09 ENCOUNTER — Encounter: Payer: Self-pay | Admitting: *Deleted

## 2011-06-09 ENCOUNTER — Emergency Department (HOSPITAL_BASED_OUTPATIENT_CLINIC_OR_DEPARTMENT_OTHER)
Admission: EM | Admit: 2011-06-09 | Discharge: 2011-06-09 | Disposition: A | Discharge status: Home / Self Care | Payer: Medicare Other | Attending: Emergency Medicine | Admitting: Emergency Medicine

## 2011-06-09 DIAGNOSIS — Z8739 Personal history of other diseases of the musculoskeletal system and connective tissue: Secondary | ICD-10-CM | POA: Insufficient documentation

## 2011-06-09 DIAGNOSIS — I83899 Varicose veins of unspecified lower extremities with other complications: Secondary | ICD-10-CM

## 2011-06-09 DIAGNOSIS — Z79899 Other long term (current) drug therapy: Secondary | ICD-10-CM | POA: Insufficient documentation

## 2011-06-09 DIAGNOSIS — I83893 Varicose veins of bilateral lower extremities with other complications: Secondary | ICD-10-CM | POA: Insufficient documentation

## 2011-06-09 DIAGNOSIS — M79609 Pain in unspecified limb: Secondary | ICD-10-CM | POA: Insufficient documentation

## 2011-06-09 HISTORY — DX: Malignant (primary) neoplasm, unspecified: C80.1

## 2011-06-09 HISTORY — DX: Asymptomatic varicose veins of unspecified lower extremity: I83.90

## 2011-06-09 NOTE — ED Notes (Signed)
Pt states that he had a scab that ?got knocked off somehow vs a bleeding varicose vein. Family put pressure dressing on right ankle "spurting". Bleeding controlled at present.

## 2011-06-09 NOTE — ED Provider Notes (Signed)
History     CSN: [id number] Arrival date & time: 06/09/2011  7:19 PM   First MD Initiated Contact with Patient 06/09/11 1933      Chief Complaint  Patient presents with  . Leg Injury    (Consider location/radiation/quality/duration/timing/severity/associated sxs/prior treatment) HPI Comments: Pt state that in the last hour he was in the shower and he noticed that he was having bleeding spurting from his right outer ankle:pt denies any injury:pt states that he couldn't get the bleeding to stop:pt states that he thinks that a scab came off  The history is provided by the patient. No language interpreter was used.    Past Medical History  Diagnosis Date  . Arthritis   . Cancer   . Nerves   . Varicose vein     Past Surgical History  Procedure Date  . Total knee arthroplasty   . Bladder surgery   . Varicose vein surgery     History reviewed. No pertinent family history.  History  Substance Use Topics  . Smoking status: Never Smoker   . Smokeless tobacco: Not on file  . Alcohol Use: No      Review of Systems  HENT: Negative.   Eyes: Negative.   Respiratory: Negative.   Cardiovascular: Negative.   Skin:       Bleeding from the ankle that pt was unable to stop    Allergies  Morphine and related  Home Medications   Current Outpatient Rx  Name Route Sig Dispense Refill  . ARIPIPRAZOLE 30 MG PO TABS Oral Take 30 mg by mouth at bedtime.      . ASPIRIN 81 MG PO TABS Oral Take 81 mg by mouth daily.      [name] BACITRACIN ZINC 500 UNIT/GM EX OINT Topical Apply 1 application topically 2 (two) times daily. For leg wound     . BUPROPION HCL ER (SR) 100 MG PO TB12 Oral Take 100 mg by mouth 2 (two) times daily.      [name] CALCIUM CARBONATE 600 MG PO TABS Oral Take 1,200 mg by mouth daily.      [name] VITAMIN D 1000 UNITS PO TABS Oral Take 1,000 Units by mouth daily.      [name] DIVALPROEX SODIUM 500 MG PO TB24 Oral Take 200 mg by mouth at bedtime.      [name] FOLIC ACID 1 MG PO TABS Oral  Take 1 mg by mouth daily.      [name] LORAZEPAM 0.5 MG PO TABS Oral Take 0.5 mg by mouth 3 (three) times daily.      [name] METHOTREXATE 2.5 MG PO TABS Oral Take 25 mg by mouth once a week. Caution:Chemotherapy. Protect from light.     Carma Leaven M PLUS PO TABS Oral Take 1 tablet by mouth daily.      [name] OMEPRAZOLE 20 MG PO CPDR Oral Take 40 mg by mouth every morning.      . OXYBUTYNIN CHLORIDE ER 15 MG PO TB24 Oral Take 15 mg by mouth 2 (two) times daily.      [name] PREDNISONE 5 MG PO TABS Oral Take 10 mg by mouth daily.      [name] PRESCRIPTION MEDICATION Ophthalmic Apply 1 drop to eye 4 (four) times daily. Eye drop to help with watery eyes     . ZIPRASIDONE HCL 60 MG PO CAPS Oral Take 120 mg by mouth at bedtime.        BP 112/66  Pulse 60  Temp(Src) 98.2 F (36.8  C) (Oral)  Resp 20  Ht 5\' 3"  (1.6 m)  Wt 183 lb (83.008 kg)  BMI 32.42 kg/m2  SpO2 97%  Physical Exam  Nursing note and vitals reviewed. Constitutional: He appears well-developed and well-nourished.  Cardiovascular: Normal rate and regular rhythm.   Pulmonary/Chest: Effort normal and breath sounds normal.  Skin:       Pt has a bleeding varicose vein to the right lateral ankle    ED Course  Procedures (including critical care time)     1. Bleeding from varicose veins of lower extremity       MDM  Unna boot placed to the area and bleeding appears to have stopped:pt told to have removed in 1 week    Medical screening examination/treatment/procedure(s) were performed by non-physician practitioner and as supervising physician I was immediately available for consultation/collaboration. Osvaldo Human, M.D.    Teressa Lower, NP 06/09/11 2011  Carleene Cooper III, MD 06/10/11 1340

## 2011-08-19 DIAGNOSIS — L608 Other nail disorders: Secondary | ICD-10-CM | POA: Diagnosis not present

## 2011-08-19 DIAGNOSIS — I739 Peripheral vascular disease, unspecified: Secondary | ICD-10-CM | POA: Diagnosis not present

## 2011-08-30 DIAGNOSIS — R233 Spontaneous ecchymoses: Secondary | ICD-10-CM | POA: Diagnosis not present

## 2011-09-02 DIAGNOSIS — Z79899 Other long term (current) drug therapy: Secondary | ICD-10-CM | POA: Diagnosis not present

## 2011-09-02 DIAGNOSIS — I998 Other disorder of circulatory system: Secondary | ICD-10-CM | POA: Diagnosis not present

## 2011-09-02 DIAGNOSIS — I10 Essential (primary) hypertension: Secondary | ICD-10-CM | POA: Diagnosis not present

## 2011-09-06 DIAGNOSIS — M25519 Pain in unspecified shoulder: Secondary | ICD-10-CM | POA: Diagnosis not present

## 2011-09-23 DIAGNOSIS — M659 Synovitis and tenosynovitis, unspecified: Secondary | ICD-10-CM | POA: Diagnosis not present

## 2011-09-23 DIAGNOSIS — M65979 Unspecified synovitis and tenosynovitis, unspecified ankle and foot: Secondary | ICD-10-CM | POA: Diagnosis not present

## 2011-09-23 DIAGNOSIS — M12279 Villonodular synovitis (pigmented), unspecified ankle and foot: Secondary | ICD-10-CM | POA: Diagnosis not present

## 2011-09-23 DIAGNOSIS — M79609 Pain in unspecified limb: Secondary | ICD-10-CM | POA: Diagnosis not present

## 2011-09-23 DIAGNOSIS — M7150 Other bursitis, not elsewhere classified, unspecified site: Secondary | ICD-10-CM | POA: Diagnosis not present

## 2011-09-27 DIAGNOSIS — M19019 Primary osteoarthritis, unspecified shoulder: Secondary | ICD-10-CM | POA: Diagnosis not present

## 2011-10-02 DIAGNOSIS — M5030 Other cervical disc degeneration, unspecified cervical region: Secondary | ICD-10-CM | POA: Diagnosis not present

## 2011-10-07 DIAGNOSIS — M19019 Primary osteoarthritis, unspecified shoulder: Secondary | ICD-10-CM | POA: Diagnosis not present

## 2011-10-28 DIAGNOSIS — I739 Peripheral vascular disease, unspecified: Secondary | ICD-10-CM | POA: Diagnosis not present

## 2011-10-28 DIAGNOSIS — L608 Other nail disorders: Secondary | ICD-10-CM | POA: Diagnosis not present

## 2011-10-28 DIAGNOSIS — L84 Corns and callosities: Secondary | ICD-10-CM | POA: Diagnosis not present

## 2011-11-06 DIAGNOSIS — M19019 Primary osteoarthritis, unspecified shoulder: Secondary | ICD-10-CM | POA: Diagnosis not present

## 2011-11-27 DIAGNOSIS — R3915 Urgency of urination: Secondary | ICD-10-CM | POA: Diagnosis not present

## 2011-11-27 DIAGNOSIS — C679 Malignant neoplasm of bladder, unspecified: Secondary | ICD-10-CM | POA: Diagnosis not present

## 2012-01-06 DIAGNOSIS — L608 Other nail disorders: Secondary | ICD-10-CM | POA: Diagnosis not present

## 2012-01-06 DIAGNOSIS — L84 Corns and callosities: Secondary | ICD-10-CM | POA: Diagnosis not present

## 2012-01-06 DIAGNOSIS — I739 Peripheral vascular disease, unspecified: Secondary | ICD-10-CM | POA: Diagnosis not present

## 2012-03-16 DIAGNOSIS — L608 Other nail disorders: Secondary | ICD-10-CM | POA: Diagnosis not present

## 2012-03-16 DIAGNOSIS — I739 Peripheral vascular disease, unspecified: Secondary | ICD-10-CM | POA: Diagnosis not present

## 2012-03-16 DIAGNOSIS — L84 Corns and callosities: Secondary | ICD-10-CM | POA: Diagnosis not present

## 2012-03-17 DIAGNOSIS — L97909 Non-pressure chronic ulcer of unspecified part of unspecified lower leg with unspecified severity: Secondary | ICD-10-CM | POA: Diagnosis not present

## 2012-03-17 DIAGNOSIS — L02419 Cutaneous abscess of limb, unspecified: Secondary | ICD-10-CM | POA: Diagnosis not present

## 2012-03-24 DIAGNOSIS — L03119 Cellulitis of unspecified part of limb: Secondary | ICD-10-CM | POA: Diagnosis not present

## 2012-03-24 DIAGNOSIS — L97509 Non-pressure chronic ulcer of other part of unspecified foot with unspecified severity: Secondary | ICD-10-CM | POA: Diagnosis not present

## 2012-03-24 DIAGNOSIS — L02419 Cutaneous abscess of limb, unspecified: Secondary | ICD-10-CM | POA: Diagnosis not present

## 2012-03-31 DIAGNOSIS — I890 Lymphedema, not elsewhere classified: Secondary | ICD-10-CM | POA: Diagnosis not present

## 2012-04-07 DIAGNOSIS — L97909 Non-pressure chronic ulcer of unspecified part of unspecified lower leg with unspecified severity: Secondary | ICD-10-CM | POA: Diagnosis not present

## 2012-04-13 DIAGNOSIS — I83219 Varicose veins of right lower extremity with both ulcer of unspecified site and inflammation: Secondary | ICD-10-CM | POA: Diagnosis not present

## 2012-04-20 DIAGNOSIS — L97929 Non-pressure chronic ulcer of unspecified part of left lower leg with unspecified severity: Secondary | ICD-10-CM | POA: Diagnosis not present

## 2012-04-20 DIAGNOSIS — I83219 Varicose veins of right lower extremity with both ulcer of unspecified site and inflammation: Secondary | ICD-10-CM | POA: Diagnosis not present

## 2012-04-28 DIAGNOSIS — L97929 Non-pressure chronic ulcer of unspecified part of left lower leg with unspecified severity: Secondary | ICD-10-CM | POA: Diagnosis not present

## 2012-04-28 DIAGNOSIS — I83219 Varicose veins of right lower extremity with both ulcer of unspecified site and inflammation: Secondary | ICD-10-CM | POA: Diagnosis not present

## 2012-04-28 DIAGNOSIS — M79609 Pain in unspecified limb: Secondary | ICD-10-CM | POA: Diagnosis not present

## 2012-05-04 ENCOUNTER — Other Ambulatory Visit: Payer: Self-pay | Admitting: Family Medicine

## 2012-05-04 DIAGNOSIS — Z1331 Encounter for screening for depression: Secondary | ICD-10-CM | POA: Diagnosis not present

## 2012-05-04 DIAGNOSIS — Z23 Encounter for immunization: Secondary | ICD-10-CM | POA: Diagnosis not present

## 2012-05-04 DIAGNOSIS — Z136 Encounter for screening for cardiovascular disorders: Secondary | ICD-10-CM | POA: Diagnosis not present

## 2012-05-04 DIAGNOSIS — R030 Elevated blood-pressure reading, without diagnosis of hypertension: Secondary | ICD-10-CM | POA: Diagnosis not present

## 2012-05-12 ENCOUNTER — Ambulatory Visit
Admission: RE | Admit: 2012-05-12 | Discharge: 2012-05-12 | Disposition: A | Discharge status: Home / Self Care | Payer: Medicare Other | Source: Ambulatory Visit | Attending: Family Medicine | Admitting: Family Medicine

## 2012-05-12 DIAGNOSIS — Z136 Encounter for screening for cardiovascular disorders: Secondary | ICD-10-CM | POA: Diagnosis not present

## 2012-06-01 DIAGNOSIS — C679 Malignant neoplasm of bladder, unspecified: Secondary | ICD-10-CM | POA: Diagnosis not present

## 2012-06-01 DIAGNOSIS — Z8551 Personal history of malignant neoplasm of bladder: Secondary | ICD-10-CM | POA: Diagnosis not present

## 2012-06-01 DIAGNOSIS — R3915 Urgency of urination: Secondary | ICD-10-CM | POA: Diagnosis not present

## 2012-08-26 DIAGNOSIS — L02419 Cutaneous abscess of limb, unspecified: Secondary | ICD-10-CM | POA: Diagnosis not present

## 2012-08-26 DIAGNOSIS — R609 Edema, unspecified: Secondary | ICD-10-CM | POA: Diagnosis not present

## 2012-08-26 DIAGNOSIS — L03119 Cellulitis of unspecified part of limb: Secondary | ICD-10-CM | POA: Diagnosis not present

## 2012-09-01 ENCOUNTER — Other Ambulatory Visit: Payer: Self-pay | Admitting: Family Medicine

## 2012-09-01 ENCOUNTER — Ambulatory Visit
Admission: RE | Admit: 2012-09-01 | Discharge: 2012-09-01 | Disposition: A | Discharge status: Home / Self Care | Payer: Medicare Other | Source: Ambulatory Visit | Attending: Family Medicine | Admitting: Family Medicine

## 2012-09-01 DIAGNOSIS — R609 Edema, unspecified: Secondary | ICD-10-CM

## 2012-09-01 DIAGNOSIS — M79609 Pain in unspecified limb: Secondary | ICD-10-CM | POA: Diagnosis not present

## 2012-09-01 DIAGNOSIS — R52 Pain, unspecified: Secondary | ICD-10-CM

## 2012-09-01 DIAGNOSIS — M7989 Other specified soft tissue disorders: Secondary | ICD-10-CM | POA: Diagnosis not present

## 2012-09-29 DIAGNOSIS — I10 Essential (primary) hypertension: Secondary | ICD-10-CM | POA: Diagnosis not present

## 2012-09-29 DIAGNOSIS — Z9181 History of falling: Secondary | ICD-10-CM | POA: Diagnosis not present

## 2012-09-29 DIAGNOSIS — R262 Difficulty in walking, not elsewhere classified: Secondary | ICD-10-CM | POA: Diagnosis not present

## 2012-10-02 DIAGNOSIS — F259 Schizoaffective disorder, unspecified: Secondary | ICD-10-CM | POA: Diagnosis not present

## 2012-10-02 DIAGNOSIS — C679 Malignant neoplasm of bladder, unspecified: Secondary | ICD-10-CM | POA: Diagnosis not present

## 2012-10-06 DIAGNOSIS — R262 Difficulty in walking, not elsewhere classified: Secondary | ICD-10-CM | POA: Diagnosis not present

## 2012-10-06 DIAGNOSIS — M1990 Unspecified osteoarthritis, unspecified site: Secondary | ICD-10-CM | POA: Diagnosis not present

## 2012-10-09 DIAGNOSIS — C679 Malignant neoplasm of bladder, unspecified: Secondary | ICD-10-CM | POA: Diagnosis not present

## 2012-10-09 DIAGNOSIS — M1990 Unspecified osteoarthritis, unspecified site: Secondary | ICD-10-CM | POA: Diagnosis not present

## 2012-10-09 DIAGNOSIS — IMO0001 Reserved for inherently not codable concepts without codable children: Secondary | ICD-10-CM | POA: Diagnosis not present

## 2012-10-09 DIAGNOSIS — F259 Schizoaffective disorder, unspecified: Secondary | ICD-10-CM | POA: Diagnosis not present

## 2012-10-09 DIAGNOSIS — I10 Essential (primary) hypertension: Secondary | ICD-10-CM | POA: Diagnosis not present

## 2012-10-09 DIAGNOSIS — R262 Difficulty in walking, not elsewhere classified: Secondary | ICD-10-CM | POA: Diagnosis not present

## 2012-10-11 DIAGNOSIS — L02419 Cutaneous abscess of limb, unspecified: Secondary | ICD-10-CM | POA: Diagnosis not present

## 2012-10-11 DIAGNOSIS — L03119 Cellulitis of unspecified part of limb: Secondary | ICD-10-CM | POA: Diagnosis not present

## 2012-10-12 DIAGNOSIS — G5600 Carpal tunnel syndrome, unspecified upper limb: Secondary | ICD-10-CM | POA: Diagnosis not present

## 2012-10-12 DIAGNOSIS — G603 Idiopathic progressive neuropathy: Secondary | ICD-10-CM | POA: Diagnosis not present

## 2012-10-12 DIAGNOSIS — R262 Difficulty in walking, not elsewhere classified: Secondary | ICD-10-CM | POA: Diagnosis not present

## 2012-10-12 DIAGNOSIS — M79609 Pain in unspecified limb: Secondary | ICD-10-CM | POA: Diagnosis not present

## 2012-10-12 DIAGNOSIS — R5381 Other malaise: Secondary | ICD-10-CM | POA: Diagnosis not present

## 2012-10-14 ENCOUNTER — Encounter (HOSPITAL_BASED_OUTPATIENT_CLINIC_OR_DEPARTMENT_OTHER): Payer: Medicare Other | Attending: General Surgery

## 2012-10-14 DIAGNOSIS — Z8551 Personal history of malignant neoplasm of bladder: Secondary | ICD-10-CM | POA: Insufficient documentation

## 2012-10-14 DIAGNOSIS — L03119 Cellulitis of unspecified part of limb: Secondary | ICD-10-CM | POA: Insufficient documentation

## 2012-10-14 DIAGNOSIS — I872 Venous insufficiency (chronic) (peripheral): Secondary | ICD-10-CM | POA: Insufficient documentation

## 2012-10-14 DIAGNOSIS — F209 Schizophrenia, unspecified: Secondary | ICD-10-CM | POA: Diagnosis not present

## 2012-10-14 DIAGNOSIS — M069 Rheumatoid arthritis, unspecified: Secondary | ICD-10-CM | POA: Insufficient documentation

## 2012-10-14 DIAGNOSIS — L02419 Cutaneous abscess of limb, unspecified: Secondary | ICD-10-CM | POA: Insufficient documentation

## 2012-10-14 DIAGNOSIS — L97809 Non-pressure chronic ulcer of other part of unspecified lower leg with unspecified severity: Secondary | ICD-10-CM | POA: Insufficient documentation

## 2012-10-14 DIAGNOSIS — Z96659 Presence of unspecified artificial knee joint: Secondary | ICD-10-CM | POA: Insufficient documentation

## 2012-10-14 DIAGNOSIS — Z79899 Other long term (current) drug therapy: Secondary | ICD-10-CM | POA: Diagnosis not present

## 2012-10-15 NOTE — Progress Notes (Signed)
[facility]  NAME:  [name], [name] NO.:  [id number]  MEDICAL RECORD NO.:  [id number]      DATE OF BIRTH:  Jul 03, 1946  PHYSICIAN:  Ardath Sax, M.D.           VISIT DATE:                                  OFFICE VISIT   Mr. Shaun Yoder is a 67 year old man who was sent to [place] because of venous stasis ulcers on his left leg.  These are multiple small ulcers with obvious surrounding cellulitis.  He has a history of many problems. The most problematic is that he is a schizophrenic and he takes many medicines for his schizophrenia.  He also has a history of bladder cancer and rheumatoid arthritis.  He has had bilateral knee replacements, and on his left leg it is very red with obvious cellulitis with some venous stasis ulcers.  I am going to treat him with Santyl and an Unna boot, since his ABI is adequate, and we will see how this does. He is taking many medicines which do not help wound healing such as methotrexate, prednisone, Enbrel.  He is also on amitriptyline and he is on Bactrim already which should help with his cellulitis.  His vital signs were relatively normal.  Blood pressure 131/72, respirations 16, temperature 97.5, pulse 64, he weighs 184 pounds, and I will see him back in a week.     Ardath Sax, M.D.     PP/MEDQ  D:  10/14/2012  T:  10/15/2012  Job:  [id number]

## 2012-10-16 DIAGNOSIS — M1990 Unspecified osteoarthritis, unspecified site: Secondary | ICD-10-CM | POA: Diagnosis not present

## 2012-10-16 DIAGNOSIS — F259 Schizoaffective disorder, unspecified: Secondary | ICD-10-CM | POA: Diagnosis not present

## 2012-10-16 DIAGNOSIS — R262 Difficulty in walking, not elsewhere classified: Secondary | ICD-10-CM | POA: Diagnosis not present

## 2012-10-16 DIAGNOSIS — C679 Malignant neoplasm of bladder, unspecified: Secondary | ICD-10-CM | POA: Diagnosis not present

## 2012-10-16 DIAGNOSIS — I10 Essential (primary) hypertension: Secondary | ICD-10-CM | POA: Diagnosis not present

## 2012-10-16 DIAGNOSIS — IMO0001 Reserved for inherently not codable concepts without codable children: Secondary | ICD-10-CM | POA: Diagnosis not present

## 2012-10-20 DIAGNOSIS — IMO0001 Reserved for inherently not codable concepts without codable children: Secondary | ICD-10-CM | POA: Diagnosis not present

## 2012-10-20 DIAGNOSIS — M1990 Unspecified osteoarthritis, unspecified site: Secondary | ICD-10-CM | POA: Diagnosis not present

## 2012-10-20 DIAGNOSIS — C679 Malignant neoplasm of bladder, unspecified: Secondary | ICD-10-CM | POA: Diagnosis not present

## 2012-10-20 DIAGNOSIS — F259 Schizoaffective disorder, unspecified: Secondary | ICD-10-CM | POA: Diagnosis not present

## 2012-10-20 DIAGNOSIS — R262 Difficulty in walking, not elsewhere classified: Secondary | ICD-10-CM | POA: Diagnosis not present

## 2012-10-20 DIAGNOSIS — I10 Essential (primary) hypertension: Secondary | ICD-10-CM | POA: Diagnosis not present

## 2012-10-21 ENCOUNTER — Encounter (HOSPITAL_BASED_OUTPATIENT_CLINIC_OR_DEPARTMENT_OTHER): Payer: Medicare Other | Attending: General Surgery

## 2012-10-21 DIAGNOSIS — I87319 Chronic venous hypertension (idiopathic) with ulcer of unspecified lower extremity: Secondary | ICD-10-CM | POA: Insufficient documentation

## 2012-10-21 DIAGNOSIS — L97909 Non-pressure chronic ulcer of unspecified part of unspecified lower leg with unspecified severity: Secondary | ICD-10-CM | POA: Insufficient documentation

## 2012-10-23 DIAGNOSIS — R262 Difficulty in walking, not elsewhere classified: Secondary | ICD-10-CM | POA: Diagnosis not present

## 2012-10-23 DIAGNOSIS — IMO0001 Reserved for inherently not codable concepts without codable children: Secondary | ICD-10-CM | POA: Diagnosis not present

## 2012-10-23 DIAGNOSIS — I10 Essential (primary) hypertension: Secondary | ICD-10-CM | POA: Diagnosis not present

## 2012-10-23 DIAGNOSIS — M1990 Unspecified osteoarthritis, unspecified site: Secondary | ICD-10-CM | POA: Diagnosis not present

## 2012-10-23 DIAGNOSIS — C679 Malignant neoplasm of bladder, unspecified: Secondary | ICD-10-CM | POA: Diagnosis not present

## 2012-10-23 DIAGNOSIS — F259 Schizoaffective disorder, unspecified: Secondary | ICD-10-CM | POA: Diagnosis not present

## 2012-10-27 DIAGNOSIS — I10 Essential (primary) hypertension: Secondary | ICD-10-CM | POA: Diagnosis not present

## 2012-10-27 DIAGNOSIS — IMO0001 Reserved for inherently not codable concepts without codable children: Secondary | ICD-10-CM | POA: Diagnosis not present

## 2012-10-27 DIAGNOSIS — R262 Difficulty in walking, not elsewhere classified: Secondary | ICD-10-CM | POA: Diagnosis not present

## 2012-10-27 DIAGNOSIS — M1990 Unspecified osteoarthritis, unspecified site: Secondary | ICD-10-CM | POA: Diagnosis not present

## 2012-10-27 DIAGNOSIS — F259 Schizoaffective disorder, unspecified: Secondary | ICD-10-CM | POA: Diagnosis not present

## 2012-10-27 DIAGNOSIS — C679 Malignant neoplasm of bladder, unspecified: Secondary | ICD-10-CM | POA: Diagnosis not present

## 2012-10-28 DIAGNOSIS — I87319 Chronic venous hypertension (idiopathic) with ulcer of unspecified lower extremity: Secondary | ICD-10-CM | POA: Diagnosis not present

## 2012-10-30 DIAGNOSIS — M1990 Unspecified osteoarthritis, unspecified site: Secondary | ICD-10-CM | POA: Diagnosis not present

## 2012-10-30 DIAGNOSIS — IMO0001 Reserved for inherently not codable concepts without codable children: Secondary | ICD-10-CM | POA: Diagnosis not present

## 2012-10-30 DIAGNOSIS — I10 Essential (primary) hypertension: Secondary | ICD-10-CM | POA: Diagnosis not present

## 2012-10-30 DIAGNOSIS — C679 Malignant neoplasm of bladder, unspecified: Secondary | ICD-10-CM | POA: Diagnosis not present

## 2012-10-30 DIAGNOSIS — R262 Difficulty in walking, not elsewhere classified: Secondary | ICD-10-CM | POA: Diagnosis not present

## 2012-10-30 DIAGNOSIS — F259 Schizoaffective disorder, unspecified: Secondary | ICD-10-CM | POA: Diagnosis not present

## 2012-11-04 DIAGNOSIS — L97909 Non-pressure chronic ulcer of unspecified part of unspecified lower leg with unspecified severity: Secondary | ICD-10-CM | POA: Diagnosis not present

## 2012-11-10 DIAGNOSIS — M1990 Unspecified osteoarthritis, unspecified site: Secondary | ICD-10-CM | POA: Diagnosis not present

## 2012-11-10 DIAGNOSIS — M069 Rheumatoid arthritis, unspecified: Secondary | ICD-10-CM | POA: Diagnosis not present

## 2012-11-10 DIAGNOSIS — M2550 Pain in unspecified joint: Secondary | ICD-10-CM | POA: Diagnosis not present

## 2012-11-10 DIAGNOSIS — L02419 Cutaneous abscess of limb, unspecified: Secondary | ICD-10-CM | POA: Diagnosis not present

## 2012-11-10 DIAGNOSIS — G5620 Lesion of ulnar nerve, unspecified upper limb: Secondary | ICD-10-CM | POA: Diagnosis not present

## 2012-11-10 DIAGNOSIS — L03119 Cellulitis of unspecified part of limb: Secondary | ICD-10-CM | POA: Diagnosis not present

## 2012-11-10 DIAGNOSIS — Z79899 Other long term (current) drug therapy: Secondary | ICD-10-CM | POA: Diagnosis not present

## 2012-11-10 LAB — BASIC METABOLIC PANEL
BUN: 21 mg/dL (ref 4–21)
CREATININE: 0.7 mg/dL (ref <=1.3)
Glucose: 72 mg/dL
Potassium: 4.7 mmol/L (ref 3.4–5.3)
SODIUM: 138 mmol/L (ref 137–147)

## 2012-11-10 LAB — HEPATIC FUNCTION PANEL
ALK PHOS: 67 U/L (ref 25–125)
ALT: 17 U/L (ref 10–40)
AST: 24 U/L (ref 14–40)
Bilirubin, Total: 0.5 mg/dL

## 2012-11-11 DIAGNOSIS — L97909 Non-pressure chronic ulcer of unspecified part of unspecified lower leg with unspecified severity: Secondary | ICD-10-CM | POA: Diagnosis not present

## 2012-11-12 DIAGNOSIS — R262 Difficulty in walking, not elsewhere classified: Secondary | ICD-10-CM | POA: Diagnosis not present

## 2012-11-12 DIAGNOSIS — G608 Other hereditary and idiopathic neuropathies: Secondary | ICD-10-CM | POA: Diagnosis not present

## 2012-11-12 DIAGNOSIS — R5381 Other malaise: Secondary | ICD-10-CM | POA: Diagnosis not present

## 2012-11-16 DIAGNOSIS — R262 Difficulty in walking, not elsewhere classified: Secondary | ICD-10-CM | POA: Diagnosis not present

## 2012-11-16 DIAGNOSIS — G603 Idiopathic progressive neuropathy: Secondary | ICD-10-CM | POA: Diagnosis not present

## 2012-11-16 DIAGNOSIS — G5690 Unspecified mononeuropathy of unspecified upper limb: Secondary | ICD-10-CM | POA: Diagnosis not present

## 2012-11-16 DIAGNOSIS — M79609 Pain in unspecified limb: Secondary | ICD-10-CM | POA: Diagnosis not present

## 2012-11-23 DIAGNOSIS — G5600 Carpal tunnel syndrome, unspecified upper limb: Secondary | ICD-10-CM | POA: Diagnosis not present

## 2012-11-30 DIAGNOSIS — R3915 Urgency of urination: Secondary | ICD-10-CM | POA: Diagnosis not present

## 2012-11-30 DIAGNOSIS — D6949 Other primary thrombocytopenia: Secondary | ICD-10-CM | POA: Diagnosis not present

## 2012-11-30 DIAGNOSIS — N3941 Urge incontinence: Secondary | ICD-10-CM | POA: Diagnosis not present

## 2012-11-30 DIAGNOSIS — C679 Malignant neoplasm of bladder, unspecified: Secondary | ICD-10-CM | POA: Diagnosis not present

## 2012-11-30 LAB — CBC AND DIFFERENTIAL
HEMATOCRIT: 41 % (ref 41–53)
Hemoglobin: 13.8 g/dL (ref 13.5–17.5)
PLATELETS: 175 10*3/uL (ref 150–399)
WBC: 11.2 10^3/mL

## 2012-12-09 ENCOUNTER — Encounter (HOSPITAL_BASED_OUTPATIENT_CLINIC_OR_DEPARTMENT_OTHER): Payer: Medicare Other | Attending: General Surgery

## 2012-12-09 DIAGNOSIS — I87319 Chronic venous hypertension (idiopathic) with ulcer of unspecified lower extremity: Secondary | ICD-10-CM | POA: Diagnosis not present

## 2012-12-09 DIAGNOSIS — L97909 Non-pressure chronic ulcer of unspecified part of unspecified lower leg with unspecified severity: Secondary | ICD-10-CM | POA: Diagnosis not present

## 2012-12-11 DIAGNOSIS — R262 Difficulty in walking, not elsewhere classified: Secondary | ICD-10-CM | POA: Diagnosis not present

## 2012-12-11 DIAGNOSIS — G5600 Carpal tunnel syndrome, unspecified upper limb: Secondary | ICD-10-CM | POA: Diagnosis not present

## 2012-12-11 DIAGNOSIS — G603 Idiopathic progressive neuropathy: Secondary | ICD-10-CM | POA: Diagnosis not present

## 2012-12-16 DIAGNOSIS — L97909 Non-pressure chronic ulcer of unspecified part of unspecified lower leg with unspecified severity: Secondary | ICD-10-CM | POA: Diagnosis not present

## 2012-12-23 ENCOUNTER — Encounter (HOSPITAL_BASED_OUTPATIENT_CLINIC_OR_DEPARTMENT_OTHER): Payer: Medicare Other | Attending: General Surgery

## 2012-12-23 DIAGNOSIS — I87319 Chronic venous hypertension (idiopathic) with ulcer of unspecified lower extremity: Secondary | ICD-10-CM | POA: Diagnosis not present

## 2012-12-23 DIAGNOSIS — L97909 Non-pressure chronic ulcer of unspecified part of unspecified lower leg with unspecified severity: Secondary | ICD-10-CM | POA: Insufficient documentation

## 2012-12-30 DIAGNOSIS — I87319 Chronic venous hypertension (idiopathic) with ulcer of unspecified lower extremity: Secondary | ICD-10-CM | POA: Diagnosis not present

## 2012-12-31 DIAGNOSIS — G5600 Carpal tunnel syndrome, unspecified upper limb: Secondary | ICD-10-CM | POA: Diagnosis not present

## 2013-01-13 DIAGNOSIS — F329 Major depressive disorder, single episode, unspecified: Secondary | ICD-10-CM | POA: Insufficient documentation

## 2013-01-13 DIAGNOSIS — D099 Carcinoma in situ, unspecified: Secondary | ICD-10-CM | POA: Insufficient documentation

## 2013-01-13 DIAGNOSIS — G5600 Carpal tunnel syndrome, unspecified upper limb: Secondary | ICD-10-CM | POA: Diagnosis not present

## 2013-01-13 DIAGNOSIS — G603 Idiopathic progressive neuropathy: Secondary | ICD-10-CM | POA: Diagnosis not present

## 2013-01-13 DIAGNOSIS — R413 Other amnesia: Secondary | ICD-10-CM | POA: Insufficient documentation

## 2013-01-13 DIAGNOSIS — M19019 Primary osteoarthritis, unspecified shoulder: Secondary | ICD-10-CM | POA: Diagnosis not present

## 2013-01-13 DIAGNOSIS — R262 Difficulty in walking, not elsewhere classified: Secondary | ICD-10-CM | POA: Diagnosis not present

## 2013-02-11 DIAGNOSIS — M069 Rheumatoid arthritis, unspecified: Secondary | ICD-10-CM | POA: Diagnosis not present

## 2013-02-11 DIAGNOSIS — M2550 Pain in unspecified joint: Secondary | ICD-10-CM | POA: Diagnosis not present

## 2013-02-11 DIAGNOSIS — Z79899 Other long term (current) drug therapy: Secondary | ICD-10-CM | POA: Diagnosis not present

## 2013-04-14 DIAGNOSIS — M2550 Pain in unspecified joint: Secondary | ICD-10-CM | POA: Diagnosis not present

## 2013-04-14 DIAGNOSIS — Z79899 Other long term (current) drug therapy: Secondary | ICD-10-CM | POA: Diagnosis not present

## 2013-04-14 DIAGNOSIS — M069 Rheumatoid arthritis, unspecified: Secondary | ICD-10-CM | POA: Diagnosis not present

## 2013-05-20 DIAGNOSIS — G5600 Carpal tunnel syndrome, unspecified upper limb: Secondary | ICD-10-CM | POA: Diagnosis not present

## 2013-05-20 DIAGNOSIS — G5690 Unspecified mononeuropathy of unspecified upper limb: Secondary | ICD-10-CM | POA: Insufficient documentation

## 2013-05-20 DIAGNOSIS — M79609 Pain in unspecified limb: Secondary | ICD-10-CM | POA: Insufficient documentation

## 2013-05-24 DIAGNOSIS — Z79899 Other long term (current) drug therapy: Secondary | ICD-10-CM | POA: Diagnosis not present

## 2013-05-26 DIAGNOSIS — R109 Unspecified abdominal pain: Secondary | ICD-10-CM | POA: Diagnosis not present

## 2013-07-13 DIAGNOSIS — M069 Rheumatoid arthritis, unspecified: Secondary | ICD-10-CM | POA: Diagnosis not present

## 2013-07-13 DIAGNOSIS — Z79899 Other long term (current) drug therapy: Secondary | ICD-10-CM | POA: Diagnosis not present

## 2013-07-13 DIAGNOSIS — M2550 Pain in unspecified joint: Secondary | ICD-10-CM | POA: Diagnosis not present

## 2013-08-12 DIAGNOSIS — H6120 Impacted cerumen, unspecified ear: Secondary | ICD-10-CM | POA: Diagnosis not present

## 2013-08-12 DIAGNOSIS — R030 Elevated blood-pressure reading, without diagnosis of hypertension: Secondary | ICD-10-CM | POA: Diagnosis not present

## 2013-08-26 DIAGNOSIS — Z79899 Other long term (current) drug therapy: Secondary | ICD-10-CM | POA: Diagnosis not present

## 2013-10-13 DIAGNOSIS — Z79899 Other long term (current) drug therapy: Secondary | ICD-10-CM | POA: Diagnosis not present

## 2013-10-13 DIAGNOSIS — R011 Cardiac murmur, unspecified: Secondary | ICD-10-CM | POA: Diagnosis not present

## 2013-10-13 DIAGNOSIS — M2550 Pain in unspecified joint: Secondary | ICD-10-CM | POA: Diagnosis not present

## 2013-10-13 DIAGNOSIS — M069 Rheumatoid arthritis, unspecified: Secondary | ICD-10-CM | POA: Diagnosis not present

## 2013-11-05 ENCOUNTER — Encounter (HOSPITAL_BASED_OUTPATIENT_CLINIC_OR_DEPARTMENT_OTHER): Payer: Medicare Other | Attending: General Surgery

## 2014-01-14 DIAGNOSIS — N138 Other obstructive and reflux uropathy: Secondary | ICD-10-CM | POA: Diagnosis not present

## 2014-01-14 DIAGNOSIS — N3941 Urge incontinence: Secondary | ICD-10-CM | POA: Diagnosis not present

## 2014-01-14 DIAGNOSIS — N401 Enlarged prostate with lower urinary tract symptoms: Secondary | ICD-10-CM | POA: Diagnosis not present

## 2014-01-14 DIAGNOSIS — C679 Malignant neoplasm of bladder, unspecified: Secondary | ICD-10-CM | POA: Diagnosis not present

## 2014-02-16 DIAGNOSIS — R5383 Other fatigue: Secondary | ICD-10-CM | POA: Diagnosis not present

## 2014-02-16 DIAGNOSIS — Z79899 Other long term (current) drug therapy: Secondary | ICD-10-CM | POA: Diagnosis not present

## 2014-02-16 DIAGNOSIS — R5381 Other malaise: Secondary | ICD-10-CM | POA: Diagnosis not present

## 2014-02-16 DIAGNOSIS — R011 Cardiac murmur, unspecified: Secondary | ICD-10-CM | POA: Diagnosis not present

## 2014-02-16 DIAGNOSIS — M2550 Pain in unspecified joint: Secondary | ICD-10-CM | POA: Diagnosis not present

## 2014-02-16 DIAGNOSIS — M069 Rheumatoid arthritis, unspecified: Secondary | ICD-10-CM | POA: Diagnosis not present

## 2014-02-18 DIAGNOSIS — R0602 Shortness of breath: Secondary | ICD-10-CM | POA: Diagnosis not present

## 2014-02-18 LAB — TSH: TSH: 2.38 u[IU]/mL (ref <=5.90)

## 2014-03-08 ENCOUNTER — Encounter: Payer: Self-pay | Admitting: Cardiology

## 2014-03-08 DIAGNOSIS — I452 Bifascicular block: Secondary | ICD-10-CM | POA: Diagnosis not present

## 2014-03-08 DIAGNOSIS — R0602 Shortness of breath: Secondary | ICD-10-CM | POA: Diagnosis not present

## 2014-03-08 DIAGNOSIS — R011 Cardiac murmur, unspecified: Secondary | ICD-10-CM | POA: Diagnosis not present

## 2014-03-08 DIAGNOSIS — I359 Nonrheumatic aortic valve disorder, unspecified: Secondary | ICD-10-CM | POA: Diagnosis not present

## 2014-03-08 DIAGNOSIS — E785 Hyperlipidemia, unspecified: Secondary | ICD-10-CM | POA: Diagnosis not present

## 2014-03-08 DIAGNOSIS — G4733 Obstructive sleep apnea (adult) (pediatric): Secondary | ICD-10-CM | POA: Diagnosis not present

## 2014-03-08 DIAGNOSIS — M4000 Postural kyphosis, site unspecified: Secondary | ICD-10-CM | POA: Diagnosis not present

## 2014-03-08 DIAGNOSIS — I10 Essential (primary) hypertension: Secondary | ICD-10-CM | POA: Diagnosis not present

## 2014-03-08 DIAGNOSIS — M4120 Other idiopathic scoliosis, site unspecified: Secondary | ICD-10-CM | POA: Diagnosis not present

## 2014-03-08 DIAGNOSIS — M4716 Other spondylosis with myelopathy, lumbar region: Secondary | ICD-10-CM | POA: Diagnosis not present

## 2014-04-19 DIAGNOSIS — M069 Rheumatoid arthritis, unspecified: Secondary | ICD-10-CM | POA: Diagnosis not present

## 2014-04-19 DIAGNOSIS — G4733 Obstructive sleep apnea (adult) (pediatric): Secondary | ICD-10-CM | POA: Diagnosis not present

## 2014-04-19 DIAGNOSIS — I10 Essential (primary) hypertension: Secondary | ICD-10-CM | POA: Diagnosis not present

## 2014-04-19 DIAGNOSIS — I359 Nonrheumatic aortic valve disorder, unspecified: Secondary | ICD-10-CM | POA: Diagnosis not present

## 2014-04-19 DIAGNOSIS — E785 Hyperlipidemia, unspecified: Secondary | ICD-10-CM | POA: Diagnosis not present

## 2014-04-19 DIAGNOSIS — I452 Bifascicular block: Secondary | ICD-10-CM | POA: Diagnosis not present

## 2014-04-19 DIAGNOSIS — R0602 Shortness of breath: Secondary | ICD-10-CM | POA: Diagnosis not present

## 2014-05-09 DIAGNOSIS — R0602 Shortness of breath: Secondary | ICD-10-CM | POA: Diagnosis not present

## 2014-05-24 DIAGNOSIS — Z23 Encounter for immunization: Secondary | ICD-10-CM | POA: Diagnosis not present

## 2014-05-24 DIAGNOSIS — M0609 Rheumatoid arthritis without rheumatoid factor, multiple sites: Secondary | ICD-10-CM | POA: Diagnosis not present

## 2014-05-24 DIAGNOSIS — M2550 Pain in unspecified joint: Secondary | ICD-10-CM | POA: Diagnosis not present

## 2014-05-24 DIAGNOSIS — Z79899 Other long term (current) drug therapy: Secondary | ICD-10-CM | POA: Diagnosis not present

## 2014-07-12 ENCOUNTER — Encounter: Payer: Self-pay | Admitting: Cardiology

## 2014-07-12 DIAGNOSIS — I350 Nonrheumatic aortic (valve) stenosis: Secondary | ICD-10-CM

## 2014-07-12 DIAGNOSIS — K219 Gastro-esophageal reflux disease without esophagitis: Secondary | ICD-10-CM

## 2014-07-12 DIAGNOSIS — M069 Rheumatoid arthritis, unspecified: Secondary | ICD-10-CM

## 2014-07-12 DIAGNOSIS — F250 Schizoaffective disorder, bipolar type: Secondary | ICD-10-CM

## 2014-07-12 DIAGNOSIS — I452 Bifascicular block: Secondary | ICD-10-CM

## 2014-07-12 DIAGNOSIS — R0602 Shortness of breath: Secondary | ICD-10-CM | POA: Diagnosis not present

## 2014-07-12 DIAGNOSIS — G4730 Sleep apnea, unspecified: Secondary | ICD-10-CM

## 2014-07-12 DIAGNOSIS — E785 Hyperlipidemia, unspecified: Secondary | ICD-10-CM

## 2014-08-01 DIAGNOSIS — N3941 Urge incontinence: Secondary | ICD-10-CM | POA: Diagnosis not present

## 2014-08-01 DIAGNOSIS — C679 Malignant neoplasm of bladder, unspecified: Secondary | ICD-10-CM | POA: Diagnosis not present

## 2014-08-01 DIAGNOSIS — N5201 Erectile dysfunction due to arterial insufficiency: Secondary | ICD-10-CM | POA: Diagnosis not present

## 2014-08-01 DIAGNOSIS — N401 Enlarged prostate with lower urinary tract symptoms: Secondary | ICD-10-CM | POA: Diagnosis not present

## 2014-08-24 DIAGNOSIS — M0609 Rheumatoid arthritis without rheumatoid factor, multiple sites: Secondary | ICD-10-CM | POA: Diagnosis not present

## 2014-08-24 DIAGNOSIS — Z23 Encounter for immunization: Secondary | ICD-10-CM | POA: Diagnosis not present

## 2014-08-24 DIAGNOSIS — M2550 Pain in unspecified joint: Secondary | ICD-10-CM | POA: Diagnosis not present

## 2014-08-24 DIAGNOSIS — Z79899 Other long term (current) drug therapy: Secondary | ICD-10-CM | POA: Diagnosis not present

## 2014-11-01 DIAGNOSIS — R195 Other fecal abnormalities: Secondary | ICD-10-CM | POA: Diagnosis not present

## 2014-11-01 DIAGNOSIS — I350 Nonrheumatic aortic (valve) stenosis: Secondary | ICD-10-CM | POA: Diagnosis not present

## 2014-11-01 DIAGNOSIS — K5790 Diverticulosis of intestine, part unspecified, without perforation or abscess without bleeding: Secondary | ICD-10-CM | POA: Diagnosis not present

## 2014-11-07 DIAGNOSIS — C679 Malignant neoplasm of bladder, unspecified: Secondary | ICD-10-CM | POA: Diagnosis not present

## 2014-11-10 DIAGNOSIS — K5790 Diverticulosis of intestine, part unspecified, without perforation or abscess without bleeding: Secondary | ICD-10-CM | POA: Diagnosis not present

## 2014-11-10 DIAGNOSIS — D123 Benign neoplasm of transverse colon: Secondary | ICD-10-CM | POA: Diagnosis not present

## 2014-11-10 DIAGNOSIS — R195 Other fecal abnormalities: Secondary | ICD-10-CM | POA: Diagnosis not present

## 2014-11-10 DIAGNOSIS — R918 Other nonspecific abnormal finding of lung field: Secondary | ICD-10-CM | POA: Diagnosis not present

## 2014-11-10 LAB — HM COLONOSCOPY

## 2014-11-14 ENCOUNTER — Encounter: Payer: Self-pay | Admitting: Cardiology

## 2014-11-14 DIAGNOSIS — K219 Gastro-esophageal reflux disease without esophagitis: Secondary | ICD-10-CM | POA: Insufficient documentation

## 2014-11-14 DIAGNOSIS — G4730 Sleep apnea, unspecified: Secondary | ICD-10-CM | POA: Insufficient documentation

## 2014-11-14 DIAGNOSIS — E785 Hyperlipidemia, unspecified: Secondary | ICD-10-CM | POA: Insufficient documentation

## 2014-11-14 DIAGNOSIS — M069 Rheumatoid arthritis, unspecified: Secondary | ICD-10-CM | POA: Insufficient documentation

## 2014-11-14 DIAGNOSIS — I452 Bifascicular block: Secondary | ICD-10-CM | POA: Insufficient documentation

## 2014-11-14 DIAGNOSIS — I350 Nonrheumatic aortic (valve) stenosis: Secondary | ICD-10-CM | POA: Insufficient documentation

## 2014-11-14 DIAGNOSIS — I10 Essential (primary) hypertension: Secondary | ICD-10-CM | POA: Diagnosis not present

## 2014-11-14 DIAGNOSIS — R0602 Shortness of breath: Secondary | ICD-10-CM | POA: Diagnosis not present

## 2014-11-14 DIAGNOSIS — F250 Schizoaffective disorder, bipolar type: Secondary | ICD-10-CM | POA: Insufficient documentation

## 2014-11-14 NOTE — Progress Notes (Unsigned)
Patient ID: Shaun Yoder, male   DOB: [birth year], 69 y.o.   MRN: [id number]   Jamire, Shabazz  Date of visit:  07/12/2014 DOB:  26-Jan-1946    Age:  69 yrs. Medical record number:  [id number]     Account number:  [id number] Primary Care Provider: Hulan Fess ____________________________ CURRENT DIAGNOSES  1. Nonrheumatic aortic (valve) stenosis  2. Shortness of breath  3. Essential (primary) hypertension  4. Hyperlipidemia, unspecified  5. Obstructive sleep apnea (adult) (pediatric)  6. Morbid (severe) obesity due to excess calories  7. Bifascicular block  8. Rheumatoid arthritis with rheumatoid factor of multiple sites without organ or systems involvement  9. Gastro-esophageal reflux disease without esophagitis  10. Other specified anemias ____________________________ ALLERGIES  Leflunomide, Intolerance-diarrhea  Morphine, Nausea ____________________________ MEDICATIONS  1. divalproex 500 mg tablet,delayed release, 4 tabs qhs  2. Abilify 30 mg tablet, QHS  3. omeprazole 20 mg tablet,delayed release, 2 p.o. daily  4. Calcarb 600 With Vitamin D 600 mg (1,500)-200 unit tablet, 1 p.o. daily  5. aspirin 81 mg chewable tablet, 1 p.o. daily  6. folic acid 1 mg tablet, 1 p.o. daily  7. multivitamin tablet, 1 p.o. daily  8. Fish Oil 300 mg-1,000 mg capsule, 1 p.o. daily  9. Myrbetriq 50 mg tablet,extended release, 1 p.o. daily  10. gabapentin 300 mg capsule, BID  11. meloxicam 15 mg tablet, 1 p.o. daily  12. ziprasidone 40 mg capsule, 1qam  1qpm  2 qhs  13. metoprolol tartrate 25 mg tablet, 1/2 tab b.i.d.  14. sulfasalazine 500 mg tablet, 2 bid  15. furosemide 20 mg tablet, 1 p.o. daily  16. Orencia 125 mg/mL subcutaneous syringe, weekly  17. prednisone 5 mg tablet, Take as directed  18. methotrexate sodium 2.5 mg tablet, 8 qd ____________________________ CHIEF COMPLAINTS  Followup of Nonrheumatic aortic (valve) stenosis ____________________________ HISTORY OF PRESENT  ILLNESS Patient seen for cardiac followup. Since he was previously here he has been doing relatively well. He continues to have a marked difficulty with his gait and has to walk with a walker. He does have some dyspnea with overactivity but has no angina. He has cut back his fluid consumption and his edema has improved. He is only taking furosemide sporadically. He has had no syncope or angina. He denies PND orthopnea. Continues to be majorly limited with arthritis and low back pain. ____________________________ PAST HISTORY  Past Medical Illnesses:  hypertension, hyperlipidemia, rheumatoid arthritis, bladder cancer treated with surgery/BCG, schizoaffective disorder, peripheral neuropathy, morbid obesity, anemia, lumbar disc disease;  Cardiovascular Illnesses:  aortic stenosis;  Surgical Procedures:  knee replacement-bil, TUR Bladder;  Cardiology Procedures-Invasive:  no history of prior cardiac procedures;  Cardiology Procedures-Noninvasive:  echocardiogram May 2015, echocardiogram October 2015;  LVEF of 50% documented via echocardiogram on 05/09/2014,   ____________________________ CARDIO-PULMONARY TEST DATES EKG Date:  03/08/2014;  Echocardiography Date: 05/09/2014;   ____________________________ SOCIAL HISTORY Alcohol Use:  beer and 1 month;  Smoking:  used to smoke but quit 2001;  Diet:  regular diet;  Lifestyle:  married;  Exercise:  exercise is limited due to physical disability;  Occupation:  retired and Tour manager;  Residence:  lives with wife;   ____________________________ REVIEW OF SYSTEMS General:  no change in weight  Integumentary:no rashes or new skin lesions. Respiratory: dyspnea with exertion Cardiovascular:  please review HPI  Genitourinary-Male: frequency, urgency  Musculoskeletal:  chronic low back pain, generalized arthritis Neurological:  denies headaches, stroke, or TIA Psychiatric:  schizo affective disroder  ____________________________ PHYSICAL EXAMINATION  VITAL SIGNS   Blood Pressure:  138/70 Sitting, Right arm, regular cuff  , 130/72 Standing, Right arm and regular cuff   Pulse:  60/min. Weight:  214.00 lbs. Height:  65"BMI: 35  Constitutional:  pleasant white male in no acute distress, mildly obese Skin:  changes of chronic venous insufficency, darkened forearms Head:  normocephalic, balding male hair pattern Eyes:  EOMS Intact, PERRLA, C and S clear, Funduscopic exam not done. Chest:  normal symmetry, clear to auscultation. Cardiac:  regular rhythm, normal S1 and S2, no murmur, grade 3/6 systolic murmur at aortic area radiating to neck Abdomen:  abdomen soft, no hepatosplenomegaly, severely obese Peripheral Pulses:  the femoral,dorsalis pedis, and posterior tibial pulses are full and equal bilaterally with no bruits auscultated. Extremities & Back:  1+ edema, bilateral venous insufficiency changes present, scar from knee surgery bilaterally Neurological:  gait slow, unsteady gait ____________________________ IMPRESSIONS/PLAN  1. Moderate to severe stenosis on echo in October but with minimal symptoms 2. Peripheral edema-multifactorial in origin 3. Morbid obesity 4. Hypertension  Recommendations:  Still difficult to assess but is not a good surgical candidate for repair of aortic stenosis. His symptoms appear to be under reasonable control. I encouraged him to use diuretics for edema and I will see him in 6 months. He is to call if he develops worsening clinical symptoms. May consider another echo again in the fall. ____________________________ TODAYS ORDERS  1. Return Visit: 6 months                       ____________________________ Cardiology Physician:  Kerry Hough MD [facility]

## 2014-11-14 NOTE — Progress Notes (Unsigned)
Patient ID: Shaun Yoder, male   DOB: 26-Feb-1946, 69 y.o.   MRN: [id number]   Amron, Guerrette  Date of visit:  11/14/2014 DOB:  January 25, 1946    Age:  37 yrs. Medical record number:  [id number]     Account number:  [id number] Primary Care Provider: Hulan Fess ____________________________ CURRENT DIAGNOSES  1. Nonrheumatic aortic (valve) stenosis  2. Shortness of breath  3. Essential (primary) hypertension  4. Hyperlipidemia, unspecified  5. Obstructive sleep apnea (adult) (pediatric)  6. Morbid (severe) obesity due to excess calories  7. Bifascicular block  8. Rheumatoid arthritis with rheumatoid factor of multiple sites without organ or systems involvement  9. Gastro-esophageal reflux disease without esophagitis  10. Other specified anemias  11. Dyspnea ____________________________ ALLERGIES  Leflunomide, Intolerance-diarrhea  Morphine, Nausea ____________________________ MEDICATIONS  1. divalproex 500 mg tablet,delayed release, 4 tabs qhs  2. Abilify 30 mg tablet, QHS  3. omeprazole 20 mg tablet,delayed release, 2 p.o. daily  4. Calcarb 600 With Vitamin D 600 mg (1,500)-200 unit tablet, 1 p.o. daily  5. aspirin 81 mg chewable tablet, 1 p.o. daily  6. folic acid 1 mg tablet, 1 p.o. daily  7. multivitamin tablet, 1 p.o. daily  8. Fish Oil 300 mg-1,000 mg capsule, 1 p.o. daily  9. Myrbetriq 50 mg tablet,extended release, 1 p.o. daily  10. gabapentin 300 mg capsule, BID  11. meloxicam 15 mg tablet, 1 p.o. daily  12. ziprasidone 40 mg capsule, 1qam  1qpm  2 qhs  13. metoprolol tartrate 25 mg tablet, 1/2 tab b.i.d.  14. sulfasalazine 500 mg tablet, 2 bid  15. Orencia 125 mg/mL subcutaneous syringe, weekly  16. methotrexate sodium 2.5 mg tablet, 8 qd ____________________________ CHIEF COMPLAINTS  Followup of Essential (primary) hypertension  Followup of Nonrheumatic aortic (valve) stenosis ____________________________ HISTORY OF PRESENT ILLNESS The patient returns early  for cardiac evaluation. He comes in today because of hypertension that has been elevated when he has been to different physicians offices he however when he checks his blood pressure at home and notes that he has been normotensive. He continues to have severe arthritis. He has a moderate amount of dyspnea with exertion but states he has had chronic dyspnea ever since he has stopped smoking a few years ago. He walks with a walker and is moderately limited. He quit taking his diuretics because it made him urinate a lot and he was not happy about that. He does not have PND orthopnea and does not have claudication. ____________________________ PAST HISTORY  Past Medical Illnesses:  hypertension, hyperlipidemia, rheumatoid arthritis, bladder cancer treated with surgery/BCG, schizoaffective disorder, peripheral neuropathy, morbid obesity, anemia, lumbar disc disease;  Cardiovascular Illnesses:  aortic stenosis;  Surgical Procedures:  knee replacement-bil, TUR Bladder;  NYHA Classification:  II;  Canadian Angina Classification:  Class 0: Asymptomatic;  Cardiology Procedures-Invasive:  no history of prior cardiac procedures;  Cardiology Procedures-Noninvasive:  echocardiogram May 2015, echocardiogram October 2015;  LVEF of 50% documented via echocardiogram on 05/09/2014,   ____________________________ CARDIO-PULMONARY TEST DATES EKG Date:  03/08/2014;  Echocardiography Date: 05/09/2014;   ____________________________ FAMILY HISTORY Brother -- Brother alive and well Father -- Father dead, Heart Attack, CVA Mother -- Mother dead, Death of unknown cause ____________________________ SOCIAL HISTORY Alcohol Use:  beer and 1 month;  Smoking:  used to smoke but quit 2001;  Diet:  regular diet;  Lifestyle:  married;  Exercise:  exercise is limited due to physical disability;  Occupation:  retired and Tour manager;  Residence:  lives with  wife;   ____________________________ REVIEW OF SYSTEMS General:  no change in  weight  Integumentary:no rashes or new skin lesions. Respiratory: dyspnea with exertion Cardiovascular:  please review HPI  Genitourinary-Male: frequency, urgency  Musculoskeletal:  chronic low back pain, generalized arthritis Neurological:  denies headaches, stroke, or TIA Psychiatric:  schizo affective disroder  ____________________________ PHYSICAL EXAMINATION VITAL SIGNS  Blood Pressure:  126/70 Sitting, Left arm, regular cuff  , 128/74 Standing, Left arm and regular cuff   Pulse:  78/min. Weight:  216.00 lbs. Height:  65"BMI: 36  Constitutional:  pleasant white male in no acute distress, mildly obese Skin:  changes of chronic venous insufficency, darkened forearms Head:  normocephalic, balding male hair pattern Eyes:  EOMS Intact, PERRLA, C and S clear, Funduscopic exam not done. Chest:  normal symmetry, clear to auscultation. Cardiac:  regular rhythm, normal S1 and S2, no murmur, grade 3/6 systolic murmur at aortic area radiating to neck Peripheral Pulses:  the femoral,dorsalis pedis, and posterior tibial pulses are full and equal bilaterally with no bruits auscultated. Extremities & Back:  1+ edema, bilateral venous insufficiency changes present, scar from knee surgery bilaterally Neurological:  gait slow, unsteady gait ____________________________ IMPRESSIONS/PLAN  1. Moderate severe aortic stenosis with peak gradient of 60 mm and mean gradient of 33 mm 2. Dyspnea with exertion multifactorial difficult to tell the etiology of this 3. Severe rheumatoid arthritis 4. Hypertensive heart disease-normotensive today and brings in a record of blood pressures showing him be normotensive at home  Recommendations:  I would like him to go back to taking furosemide 20 mg daily to see if he will tolerate this. He continues to have dyspnea with exertion and has moderately severe aortic stenosis. The peak gradient was 60 mm in my office recently. He is limited because of severe back pain as well as  severe arthritis of the think would be a poor surgical candidate. He remains difficult to assess and I would suggest that he talk to Dr. Sherren Mocha about the potential for TAVR although the gradient is moderately severe he continues to have dyspnea with exertion and is difficult to assess. Followup in June for his regular appointment and will await Dr. Antionette Char opinion. ____________________________ TODAYS ORDERS  1. Cardiology Consult: Dr. Burt Knack aortic stenosis  2. Return Visit: keep appt                       ____________________________ Cardiology Physician:  Kerry Hough MD [facility]

## 2014-11-14 NOTE — Progress Notes (Signed)
Patient ID: Shaun Yoder, male   DOB: [birth year], 69 y.o.   MRN: [id number]   Shaun Yoder  Date of visit:  03/08/2014 DOB:  16-Jun-1946    Age:  69 yrs. Medical record number:  [id number]     Account number:  [id number] Primary Care Provider: Hulan Fess ____________________________ CURRENT DIAGNOSES  1. Aortic Valve-Stenosis  2. Murmur  3. Dyspnea  4. Hyperlipidemia  5. Hypertension,Essential (Benign)  6. Sleep Apnea  7. Obesity, morbid (BMI>40)  8. Conduction Disorder-RBBB w/LAFB  9. Chronic Rheumatic Arthritis  10. GERD  11. Anemia,other specified  12. Dyspnea ____________________________ ALLERGIES  Leflunomide, Intolerance-diarrhea  Morphine, Nausea ____________________________ MEDICATIONS  1. divalproex 500 mg tablet,delayed release, 4 tabs qhs  2. Abilify 30 mg tablet, QHS  3. omeprazole 20 mg tablet,delayed release, 2 p.o. daily  4. Calcarb 600 With Vitamin D 600 mg (1,500)-200 unit tablet, 1 p.o. daily  5. aspirin 81 mg chewable tablet, 1 p.o. daily  6. methotrexate sodium 2.5 mg tablet, 9 tabs q week  7. folic acid 1 mg tablet, 1 p.o. daily  8. multivitamin tablet, 1 p.o. daily  9. Enbrel 50 mg/mL (0.98 mL) subcutaneous syringe, Take as directed  10. Fish Oil 300 mg-1,000 mg capsule, 1 p.o. daily  11. Myrbetriq 50 mg tablet,extended release, 1 p.o. daily  12. gabapentin 300 mg capsule, BID  13. meloxicam 15 mg tablet, 1 p.o. daily  14. ziprasidone 40 mg capsule, 1qam  1qpm  2 qhs  15. metoprolol tartrate 25 mg tablet, 1/2 tab b.i.d.  16. sulfasalazine 500 mg tablet, 2 bid  17. furosemide 20 mg tablet, 1 p.o. daily ____________________________ CHIEF COMPLAINTS  Murmur ____________________________ HISTORY OF PRESENT ILLNESS This 69 year old male is seen at the request of Dr. Rex Kras for evaluation of aortic stenosis and dyspnea. The patient has a prior history of rheumatoid arthritis that is severe as well as significant morbid obesity. He has a history  of hypertension and hyperlipidemia and also significant problems with peripheral neuropathy. He has a significant schizoaffective disorder following a tour in [place] and is on complete disability from the [place] and is followed there periodically. He has severe rheumatoid arthritis and is currently taking Embrel as well as methotrexate as well as periodic pulse prednisone. In May he was seen in the [place] with some chest pain and placed on metoprolol and had an echocardiogram that showed moderate aortic valve stenosis with a peak gradient of 47 mm and a mean gradient of 27 mm. He has gained 16 pounds over the past2 months weeks and was noted to have worsening shortness of breath. He was seen at his primary physician's office and placed on furosemide 20 mg twice a day and notes that his dyspnea has improved but he quit taking it after about 4 days because of severe urinary frequency. He thinks that his breathing has improved although his wife says that he still becomes dyspneic with most any level of activity. He has severe lumbar disc disease and finds it very difficult to walk because of severe back pain as well as dyspnea. He is not currently having any chest discomfort suggestive of angina. He denies PND, orthopnea. He has significant chronic venous disease and has had wounds on his lower legs and has to wear support wraps. He has no claudication although he has severe peripheral neuropathy. ____________________________ PAST HISTORY  Past Medical Illnesses:  hypertension, hyperlipidemia, rheumatoid arthritis, bladder cancer treated with surgery/BCG, schizoaffective disorder, peripheral neuropathy, morbid obesity, anemia, lumbar disc  disease;  Cardiovascular Illnesses:  aortic stenosis;  Surgical Procedures:  knee replacement-bil, TUR Bladder;  Cardiology Procedures-Invasive:  no history of prior cardiac procedures;  Cardiology Procedures-Noninvasive:  echocardiogram May 2015;  LVEF of 55% documented via  echocardiogram on 12/03/2013,   ____________________________ CARDIO-PULMONARY TEST DATES EKG Date:  03/08/2014;   ____________________________ FAMILY HISTORY Brother -- Brother alive and well Father -- Father dead, Heart Attack, CVA Mother -- Mother dead, Death of unknown cause ____________________________ SOCIAL HISTORY Alcohol Use:  beer and 1 month;  Smoking:  used to smoke but quit 2001;  Diet:  regular diet;  Lifestyle:  married;  Exercise:  no regular exercise;  Occupation:  retired and Tour manager;  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 Eyes: denies diplopia, history of glaucoma or visual problems. Ears, Nose, Throat, Mouth:  denies any hearing loss, epistaxis, hoarseness or difficulty speaking. Respiratory: dyspnea with exertion Cardiovascular:  please review HPI Abdominal: denies dyspepsia, GI bleeding, constipation, or diarrhea Genitourinary-Male: frequency, urgency  Musculoskeletal:  chronic low back pain, generalized arthritis Neurological:  denies headaches, stroke, or TIA Psychiatric:  schizo affective disroder  ____________________________ PHYSICAL EXAMINATION VITAL SIGNS  Blood Pressure:  140/80 Sitting, Left arm, large cuff  , 146/72 Standing, Left arm and large cuff   Pulse:  82/min. Weight:  217.00 lbs. Height:  61"BMI: 41  Constitutional:  pleasant white male in no acute distress, severely obese walks with walker Skin:  changes of chronic venous insufficency, darkened forearms Head:  normocephalic, balding male hair pattern Eyes:  EOMS Intact, PERRLA, C and S clear, Funduscopic exam not done. ENT:  ears, nose and throat reveal no gross abnormalities.  Dentition good. Neck:  supple, without massess. No JVD, thyromegaly or carotid bruits. Carotid upstroke normal. Chest:  normal symmetry, clear to auscultation. Cardiac:  regular rhythm,  normal S1 and S2, no murmur, grade 3/6 systolic murmur at aortic area radiating to neck Abdomen:  abdomen soft, no hepatosplenomegaly, severely obese Peripheral Pulses:  the femoral,dorsalis pedis, and posterior tibial pulses are full and equal bilaterally with no bruits auscultated. Extremities & Back:  1+ edema, bilateral venous insufficiency changes present, scar from knee surgery bilaterally Neurological:  gait slow, unsteady gait ____________________________ IMPRESSIONS/PLAN  1. Significant aortic stenosis murmur on examination with moderate stenosis on echo in May 2. Dyspnea on exertion likely multifactorial there may be an element of diastolic dysfunction, worsening aortic stenosis that could have been underappreciated 3. probable diastolic dysfunction with diastolic heart failure 4. Morbid obesity 5. Severe rheumatoid arthritis 6. Severe chronic back pain  7. Hyperlipidemia  Recommendations:  He is quite difficult to assess. The aortic stenosis murmur sounds significant to me but he had a recent echo showing moderate stenosis. I would recommend that he go back on furosemide 20 mg daily and have a followup BNP level as well as chemistry panel. I would like him to have a repeat echo in a couple of months to reassess the aortic valve and look carefully at the gradient on it. Followup here in 6 weeks. EKG shows sinus rhythm with bifasciular block. ____________________________ TODAYS ORDERS  1. 12 Lead EKG: Today  2. Comprehensive Metabolic Panel: Today  3. BNP: Today  4. 2D, color flow, doppler: 2 months  5. Return Visit: 6 weeks  ____________________________ Cardiology Physician:  Kerry Hough MD [facility]

## 2014-11-22 DIAGNOSIS — M2550 Pain in unspecified joint: Secondary | ICD-10-CM | POA: Diagnosis not present

## 2014-11-22 DIAGNOSIS — M0609 Rheumatoid arthritis without rheumatoid factor, multiple sites: Secondary | ICD-10-CM | POA: Diagnosis not present

## 2014-11-22 DIAGNOSIS — Z79899 Other long term (current) drug therapy: Secondary | ICD-10-CM | POA: Diagnosis not present

## 2014-12-01 DIAGNOSIS — D692 Other nonthrombocytopenic purpura: Secondary | ICD-10-CM | POA: Diagnosis not present

## 2014-12-01 DIAGNOSIS — L249 Irritant contact dermatitis, unspecified cause: Secondary | ICD-10-CM | POA: Diagnosis not present

## 2014-12-09 ENCOUNTER — Encounter (INDEPENDENT_AMBULATORY_CARE_PROVIDER_SITE_OTHER): Payer: Self-pay

## 2014-12-09 ENCOUNTER — Ambulatory Visit (INDEPENDENT_AMBULATORY_CARE_PROVIDER_SITE_OTHER): Payer: Medicare Other | Admitting: Pulmonary Disease

## 2014-12-09 ENCOUNTER — Encounter: Payer: Self-pay | Admitting: Pulmonary Disease

## 2014-12-09 VITALS — BP 146/74 | HR 48 | Temp 97.8°F | Ht 61.0 in | Wt 215.6 lb

## 2014-12-09 DIAGNOSIS — R918 Other nonspecific abnormal finding of lung field: Secondary | ICD-10-CM | POA: Insufficient documentation

## 2014-12-09 NOTE — Progress Notes (Signed)
   Subjective:    Patient ID: Shaun Yoder, male    DOB: 12/28/1945, 69 y.o.   MRN: [id number]  HPI The patient is a 69 year old male who I've been asked to see for an abnormal imaging of the chest. He recently underwent virtual colonoscopy, and was found on his lower lung cuts to have an 8 mm subpleural nodule in the left lower lobe, and a 5 mm right lower lobe nodule as well. He was also felt to have nodular opacities in the left base that may be inflammatory. The patient has not had a full CT chest. He has a very extensive history of smoking 2-1/2 packs per day for 43 years, and also has a history of bladder cancer that is felt to be low-grade and controlled per the patient. The patient also has a history of rheumatoid arthritis for which she is on methotrexate. He denies ever living in the [place] or [place], and has no history of tuberculosis or TB exposure. He has had a negative PPD in the past. He feels that he is eating well, and denies any weight loss. He has not had any hemoptysis. The patient does have very significant aortic stenosis, and is scheduled to see cardiology next week regarding nonsurgical management.   Review of Systems  Constitutional: Negative for fever and unexpected weight change.  HENT: Negative for congestion, dental problem, ear pain, nosebleeds, postnasal drip, rhinorrhea, sinus pressure, sneezing, sore throat and trouble swallowing.   Eyes: Negative for redness and itching.  Respiratory: Positive for shortness of breath. Negative for cough, chest tightness and wheezing.   Cardiovascular: Positive for leg swelling. Negative for palpitations.  Gastrointestinal: Negative for nausea and vomiting.  Genitourinary: Negative for dysuria.  Musculoskeletal: Negative for joint swelling.  Skin: Negative for rash.  Neurological: Negative for headaches.  Hematological: Does not bruise/bleed easily.  Psychiatric/Behavioral: Negative for dysphoric mood. The patient  is not nervous/anxious.        Objective:   Physical Exam Constitutional:  Obese male, no acute distress.  Very debilitated.   HENT:  Nares patent without discharge  Oropharynx without exudate, palate and uvula are normal  Eyes:  Perrla, eomi, no scleral icterus  Neck:  No JVD, no TMG  Cardiovascular:  Normal rate, regular rhythm, no rubs or gallops.  4/6 sem        Decreased pulses.   Pulmonary :  Normal breath sounds, no stridor or respiratory distress   No rales, rhonchi, or wheezing  Abdominal:  Soft, nondistended, bowel sounds present.  No tenderness noted.   Musculoskeletal:  2+ lower extremity edema noted, +venous stasis changes.   Lymph Nodes:  No cervical lymphadenopathy noted  Skin:  No cyanosis noted  Neurologic:  Alert, appropriate, moves all 4 extremities without obvious deficit.         Assessment & Plan:

## 2014-12-09 NOTE — Assessment & Plan Note (Signed)
The patient has very small pulmonary nodules noted on his recent virtual colonoscopy in both bases of the lungs, but has not had a full CT chest. He is considered to be in a high risk Fleischner group, and will need a full CT scan of his chest given his extensive smoking history and history of bladder cancer. Based on these results, we can then arrange further surveillance based on Fleischner guidelines. I have explained to him that these are likely either inflammatory or simply intrapulmonary lymph nodes. Given his history of rheumatoid arthritis, they may simply represent rheumatoid nodules. The patient is agreeable to this approach.

## 2014-12-09 NOTE — Patient Instructions (Signed)
Will schedule for a full scan of your chest next Tues at Dr. Antionette Char office just before his apptm. Will call once results are available, and we can discuss plan going forward.

## 2014-12-13 ENCOUNTER — Encounter: Payer: Self-pay | Admitting: Cardiovascular Disease

## 2014-12-13 ENCOUNTER — Ambulatory Visit (INDEPENDENT_AMBULATORY_CARE_PROVIDER_SITE_OTHER): Payer: Medicare Other | Admitting: Cardiovascular Disease

## 2014-12-13 ENCOUNTER — Ambulatory Visit (INDEPENDENT_AMBULATORY_CARE_PROVIDER_SITE_OTHER)
Admission: RE | Admit: 2014-12-13 | Discharge: 2014-12-13 | Disposition: A | Discharge status: Home / Self Care | Payer: Medicare Other | Source: Ambulatory Visit | Attending: Pulmonary Disease | Admitting: Pulmonary Disease

## 2014-12-13 VITALS — BP 132/84 | HR 49 | Ht 61.0 in | Wt 212.1 lb

## 2014-12-13 DIAGNOSIS — R918 Other nonspecific abnormal finding of lung field: Secondary | ICD-10-CM | POA: Diagnosis not present

## 2014-12-13 DIAGNOSIS — I350 Nonrheumatic aortic (valve) stenosis: Secondary | ICD-10-CM | POA: Diagnosis not present

## 2014-12-13 DIAGNOSIS — I2510 Atherosclerotic heart disease of native coronary artery without angina pectoris: Secondary | ICD-10-CM | POA: Diagnosis not present

## 2014-12-13 DIAGNOSIS — Z8551 Personal history of malignant neoplasm of bladder: Secondary | ICD-10-CM | POA: Diagnosis not present

## 2014-12-13 NOTE — Patient Instructions (Signed)
Medication Instructions:  Your physician recommends that you continue on your current medications as directed. Please refer to the Current Medication list given to you today.  Labwork: No new orders.  Testing/Procedures: Your physician has requested that you have an echocardiogram. Echocardiography is a painless test that uses sound waves to create images of your heart. It provides your doctor with information about the size and shape of your heart and how well your heart's chambers and valves are working. This procedure takes approximately one hour. There are no restrictions for this procedure.  Follow-Up: We will arrange further follow-up after your echocardiogram.   Any Other Special Instructions Will Be Listed Below (If Applicable).

## 2014-12-13 NOTE — Progress Notes (Signed)
Cardiology Office Note   Date:  12/15/2014   ID:  Shaun Yoder, DOB 03-01-1946, MRN [id number]  PCP:  Gennette Pac, MD  Cardiologist:  Sherren Mocha, MD    Chief Complaint  Patient presents with  . Shortness of Breath    History of Present Illness: Shaun Yoder is a 69 y.o. male who presents for evaluation of aortic stenosis. He's been followed by [facility] and was initially referred to Dr Wynonia Lawman.   The patient smoked for over 40 years - he quit 15 years ago. He states 'I've been short of breath my entire life.' However, his exertional dyspnea has progressed over the past year. No orthopnea or PND. Denies chest pain or pressure. No lightheadedness or syncope. He is short of breath with low-level activities and describes NYHA functional class 3 symptoms.   The patient is married and is here with his wife today. 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 The patient has had multiple medical problems including bladder cancer, rheumatoid arthritis, peripheral neuropathy, and schizoaffective disorder. He ambulates with a walker, even inside his home. He has a lot of problems with his balance. Also has had bilateral knee replacements.   Past Medical History  Diagnosis Date  . Arthritis   . Cancer     bladder  . Nerves   . Varicose vein   . OA (osteoarthritis)   . Hyperlipidemia   . GERD (gastroesophageal reflux disease)   . Schizoaffective disorder, bipolar type   . Anemia   . Joint pain   . Iron deficiency   . Hypertension   . Low back pain   . Colitis   . Gastritis   . Depression   . Sleep apnea   . Colon polyps   . Diverticulosis   . History of rectal polyps     Past Surgical History  Procedure Laterality Date  . Total knee arthroplasty    . Bladder surgery    . Varicose vein surgery      Current Outpatient Prescriptions  Medication Sig Dispense Refill  . Abatacept 125 MG/ML  SOSY Inject into the skin once a week.    . ARIPiprazole (ABILIFY) 30 MG tablet Take 30 mg by mouth at bedtime.      [name] aspirin 81 MG tablet Take 81 mg by mouth daily.      . calcium carbonate (OS-CAL) 600 MG TABS Take 1,200 mg by mouth daily.      . divalproex (DEPAKOTE ER) 500 MG 24 hr tablet 4 tabs at bedtime    . Fish Oil OIL Take 1 capsule by mouth daily.     . folic acid (FOLVITE) 1 MG tablet Take 1 mg by mouth daily.      [name] gabapentin (NEURONTIN) 300 MG capsule Take 300 mg by mouth 2 (two) times daily.     . meloxicam (MOBIC) 15 MG tablet Take 15 mg by mouth daily.    . methotrexate (RHEUMATREX) 2.5 MG tablet Take 25 mg by mouth once a week. Caution:Chemotherapy. Protect from light.     . metoprolol succinate (TOPROL-XL) 25 MG 24 hr tablet Take 12.5 mg by mouth daily.    . mirabegron ER (MYRBETRIQ) 50 MG TB24 tablet Take 50 mg by mouth daily.    . Multiple Vitamins-Minerals (MULTIVITAMINS THER. W/MINERALS) TABS Take 1 tablet by mouth daily.      [name] omeprazole (PRILOSEC) 20 MG capsule Take  40 mg by mouth every morning.      . ziprasidone (GEODON) 60 MG capsule 1 tablet every AM and PM and 2 at bedtime     No current facility-administered medications for this visit.    Allergies:   Leflunomide and Morphine and related   Social History:  The patient  reports that he quit smoking about 16 years ago. His smoking use included Cigarettes. He has a 107.5 pack-year smoking history. He does not have any smokeless tobacco history on file. He reports that he does not drink alcohol or use illicit drugs.   Family History:  The patient's family history includes Heart attack in his father and mother.    ROS:  Please see the history of present illness.  Otherwise, review of systems is positive for leg swelling, DOE, easy bruising, balance problems.  All other systems are reviewed and negative.    PHYSICAL EXAM: VS:  BP 132/84 mmHg  Pulse 49  Ht 5\' 1"  (1.549 m)  Wt 212 lb 1.9 oz (96.217 kg)  BMI  40.10 kg/m2  SpO2 89% , BMI Body mass index is 40.1 kg/(m^2). GEN: Pleasant obese male, in no acute distress HEENT: normal Neck: no JVD, no masses. Bilateral carotid bruits Cardiac: RRR with grade 3/6 harsh late peaking systolic murmur at the RUSB, diminished A2              Respiratory:  clear to auscultation bilaterally, normal work of breathing GI: soft, nontender, nondistended, + BS MS: rheumatoid changes noted Ext: 1+ pretibial edema Skin: warm and dry, severe venous stasis dermatitis bilaterally Neuro:  Strength and sensation are intact, gait slow/unsteady Psych: euthymic mood, full affect  EKG:  EKG is not ordered today.  Recent Labs: No results found for requested labs within last 365 days.   Lipid Panel  No results found for: CHOL, TRIG, HDL, CHOLHDL, VLDL, LDLCALC, LDLDIRECT    Wt Readings from Last 3 Encounters:  12/13/14 212 lb 1.9 oz (96.217 kg)  12/09/14 215 lb 9.6 oz (97.796 kg)  06/09/11 183 lb (83.008 kg)     Cardiac Studies Reviewed: 2D Echo personally reviewed  RISK SCORES About the STS Risk Calculator Procedure: AV Replacement Risk of Mortality: 2.204% Morbidity or Mortality: 14.566% Long Length of Stay: 6.059% Short Length of Stay: 48.409% Permanent Stroke: 0.815% Prolonged Ventilation: 7.345% DSW Infection: 0.294% Renal Failure: 3.428% Reoperation: 6.441%  ASSESSMENT AND PLAN: 68 year-old gentleman with multiple medical problems presents for evaluation of moderately severe aortic stenosis. His clinical exam and progressive dyspnea suggest progressive and now symptomatic aortic stenosis. The patient is here with his wife today. I have reviewed the natural history of aortic stenosis with them. We have discussed the limitations of medical therapy and the poor prognosis associated with symptomatic aortic stenosis. We have also reviewed potential treatment options, including palliative medical therapy, conventional surgical aortic valve replacement, and  transcatheter aortic valve replacement. We discussed treatment options in the context of this patient's specific comorbid medical conditions. The STS Risk Calculator does not capture his risk of conventional AVR because his primary risk of morbidity relates to his markedly diminished functional capacity and severe rheumatoid arthritis.  I personally reviewed his echo images from 04/2014 and this shows normal LV systolic function with moderate calcification and restricted leaflet motion of the aortic valve. I do not appreciate any other significant valvular disease. As his symptoms have progressed I recommend that we repeat a 2D Echo to reassess. I've also recommended right and left  heart catheterization to better characterize the patient's aortic stenosis and to evaluate for the presence of concomitant CAD. I have reviewed the risks, indications, and alternatives to cardiac catheterization with the patient. Risks include but are not limited to bleeding, infection, vascular injury, stroke, myocardial infection, arrhythmia, kidney injury, radiation-related injury in the case of prolonged fluoroscopy use, emergency cardiac surgery, and death. The patient understands the risks of serious complication is low (<3%).   The patient prefers to wait on scheduling cath and would like to see echo results first. I will contact him once his echo is completed and will discuss further evaluation at that time. I suspect he will require aortic valve replacement within the next year if not in the near future. I do not think he will be a candidate for conventional surgical AVR based on severe arthritis on chronic immunosuppressive therapy. However, he will require formal cardiac surgical evaluation as we move forward with his workup.  Time spent conducting this evaluation exceeds 75 minutes, greater than half of that time spent in discussion with the patient and his wife as outlined above.  Current medicines are reviewed with  the patient today.  The patient does not have concerns regarding medicines.  Labs/ tests ordered today include:   Orders Placed This Encounter  Procedures  . Echocardiogram   Disposition:   FU pending echo results  Signed, Sherren Mocha, MD  12/15/2014 11:30 PM    [address], [place], [place]  [address] Phone: [phone]; Fax: [phone]

## 2014-12-20 ENCOUNTER — Telehealth: Payer: Self-pay | Admitting: Pulmonary Disease

## 2014-12-20 DIAGNOSIS — H9390 Unspecified disorder of ear, unspecified ear: Secondary | ICD-10-CM | POA: Diagnosis not present

## 2014-12-20 DIAGNOSIS — H6121 Impacted cerumen, right ear: Secondary | ICD-10-CM | POA: Diagnosis not present

## 2014-12-21 ENCOUNTER — Other Ambulatory Visit: Payer: Self-pay

## 2014-12-21 ENCOUNTER — Ambulatory Visit (HOSPITAL_COMMUNITY): Payer: Medicare Other | Attending: Cardiovascular Disease

## 2014-12-21 DIAGNOSIS — I059 Rheumatic mitral valve disease, unspecified: Secondary | ICD-10-CM | POA: Diagnosis not present

## 2014-12-21 DIAGNOSIS — I517 Cardiomegaly: Secondary | ICD-10-CM | POA: Diagnosis not present

## 2014-12-21 DIAGNOSIS — I350 Nonrheumatic aortic (valve) stenosis: Secondary | ICD-10-CM

## 2014-12-21 NOTE — Telephone Encounter (Signed)
Spoke with pt's wife.  Pt had CT Chest done last wk.  Requesting results and would like to know how to move forward based upon these results.  [place], please advise.  Thank you.

## 2014-12-22 ENCOUNTER — Other Ambulatory Visit: Payer: Self-pay | Admitting: Pulmonary Disease

## 2014-12-22 DIAGNOSIS — Z79899 Other long term (current) drug therapy: Secondary | ICD-10-CM | POA: Diagnosis not present

## 2014-12-22 DIAGNOSIS — H6122 Impacted cerumen, left ear: Secondary | ICD-10-CM | POA: Diagnosis not present

## 2014-12-22 DIAGNOSIS — H6503 Acute serous otitis media, bilateral: Secondary | ICD-10-CM | POA: Diagnosis not present

## 2014-12-22 DIAGNOSIS — M2550 Pain in unspecified joint: Secondary | ICD-10-CM | POA: Diagnosis not present

## 2014-12-22 DIAGNOSIS — H6063 Unspecified chronic otitis externa, bilateral: Secondary | ICD-10-CM | POA: Diagnosis not present

## 2014-12-22 DIAGNOSIS — J040 Acute laryngitis: Secondary | ICD-10-CM | POA: Diagnosis not present

## 2014-12-22 DIAGNOSIS — R918 Other nonspecific abnormal finding of lung field: Secondary | ICD-10-CM

## 2014-12-22 DIAGNOSIS — J320 Chronic maxillary sinusitis: Secondary | ICD-10-CM | POA: Diagnosis not present

## 2014-12-22 DIAGNOSIS — J322 Chronic ethmoidal sinusitis: Secondary | ICD-10-CM | POA: Diagnosis not present

## 2014-12-22 MED ORDER — LEVOFLOXACIN 750 MG PO TABS: 750.0000 mg | ORAL_TABLET | Freq: Every day | ORAL | Status: DC

## 2014-12-22 NOTE — Telephone Encounter (Signed)
I have already spoken with pt regarding results.

## 2014-12-23 DIAGNOSIS — M19212 Secondary osteoarthritis, left shoulder: Secondary | ICD-10-CM | POA: Diagnosis not present

## 2014-12-23 DIAGNOSIS — M19211 Secondary osteoarthritis, right shoulder: Secondary | ICD-10-CM | POA: Diagnosis not present

## 2014-12-27 ENCOUNTER — Telehealth: Payer: Self-pay | Admitting: Cardiovascular Disease

## 2014-12-27 NOTE — Telephone Encounter (Signed)
Spoke with pt and informed him that his echo had not been resulted yet. Pt states that is fine, he was just calling to check. Informed him that I would route this information to Dr. Antionette Char nurse and pt was agreeable. Pt states that he is not in a hurry, just call when ready.

## 2014-12-27 NOTE — Telephone Encounter (Signed)
New message ° ° ° °Patient calling for echo test results °

## 2014-12-28 NOTE — Telephone Encounter (Signed)
Left message on machine for pt to contact the office for echocardiogram results.

## 2014-12-28 NOTE — Telephone Encounter (Signed)
Pt and pt's wife aware of results by phone. At this time the pt is adamant that he does not want to proceed with any further evaluation of aortic stenosis. I spoke with the pt about worsening SOB, developing chest pain and syncope related to aortic stenosis. Again the pt said he was not ready to have anything done further but would continue to think about his options. The pt asked that a copy of his Echo be mailed to his home so that he can take this report to the [place]. Echo report mailed.

## 2015-01-12 DIAGNOSIS — J410 Simple chronic bronchitis: Secondary | ICD-10-CM | POA: Diagnosis not present

## 2015-01-12 DIAGNOSIS — H6063 Unspecified chronic otitis externa, bilateral: Secondary | ICD-10-CM | POA: Diagnosis not present

## 2015-01-12 DIAGNOSIS — H903 Sensorineural hearing loss, bilateral: Secondary | ICD-10-CM | POA: Diagnosis not present

## 2015-01-12 DIAGNOSIS — J322 Chronic ethmoidal sinusitis: Secondary | ICD-10-CM | POA: Diagnosis not present

## 2015-01-12 DIAGNOSIS — J320 Chronic maxillary sinusitis: Secondary | ICD-10-CM | POA: Diagnosis not present

## 2015-01-12 DIAGNOSIS — H6122 Impacted cerumen, left ear: Secondary | ICD-10-CM | POA: Diagnosis not present

## 2015-01-12 DIAGNOSIS — R05 Cough: Secondary | ICD-10-CM | POA: Diagnosis not present

## 2015-01-12 DIAGNOSIS — J040 Acute laryngitis: Secondary | ICD-10-CM | POA: Diagnosis not present

## 2015-01-26 ENCOUNTER — Telehealth: Payer: Self-pay | Admitting: Internal Medicine

## 2015-01-26 NOTE — Telephone Encounter (Signed)
Pt in office with his wife who is an existing pt of Dr. Birdie Riddle. He is asking if possible for you to accept him as a new pt. They live in [place] and we are closer than [facility] Dr. Rex Kras, current PCP. Would you be willing to accept him as a new pt?

## 2015-01-27 NOTE — Telephone Encounter (Signed)
Pt scheduled for 03/22/15

## 2015-01-27 NOTE — Telephone Encounter (Signed)
Please advise 

## 2015-01-27 NOTE — Telephone Encounter (Signed)
That is ok, thx  

## 2015-01-27 NOTE — Telephone Encounter (Signed)
Okay to schedule new Pt appt at [facility]. Please inform Pt to bring with him to appt any pertinent medical records or he can have [facility] release the records and sent to [place] before hand. Thank you.

## 2015-02-10 ENCOUNTER — Other Ambulatory Visit: Payer: Self-pay | Admitting: Pulmonary Disease

## 2015-02-10 DIAGNOSIS — R918 Other nonspecific abnormal finding of lung field: Secondary | ICD-10-CM

## 2015-02-23 DIAGNOSIS — M2550 Pain in unspecified joint: Secondary | ICD-10-CM | POA: Diagnosis not present

## 2015-02-23 DIAGNOSIS — M0589 Other rheumatoid arthritis with rheumatoid factor of multiple sites: Secondary | ICD-10-CM | POA: Diagnosis not present

## 2015-02-23 DIAGNOSIS — Z79899 Other long term (current) drug therapy: Secondary | ICD-10-CM | POA: Diagnosis not present

## 2015-02-23 DIAGNOSIS — M150 Primary generalized (osteo)arthritis: Secondary | ICD-10-CM | POA: Diagnosis not present

## 2015-02-23 DIAGNOSIS — D696 Thrombocytopenia, unspecified: Secondary | ICD-10-CM | POA: Diagnosis not present

## 2015-03-16 ENCOUNTER — Ambulatory Visit (INDEPENDENT_AMBULATORY_CARE_PROVIDER_SITE_OTHER)
Admission: RE | Admit: 2015-03-16 | Discharge: 2015-03-16 | Disposition: A | Discharge status: Home / Self Care | Payer: Medicare Other | Source: Ambulatory Visit | Attending: Pulmonary Disease | Admitting: Pulmonary Disease

## 2015-03-16 ENCOUNTER — Telehealth: Payer: Self-pay | Admitting: Pulmonary Disease

## 2015-03-16 ENCOUNTER — Other Ambulatory Visit: Payer: Medicare Other

## 2015-03-16 DIAGNOSIS — R918 Other nonspecific abnormal finding of lung field: Secondary | ICD-10-CM | POA: Diagnosis not present

## 2015-03-16 NOTE — Telephone Encounter (Signed)
Result Note     Please contact the patient and set up an appointment in the office to discuss his CT scan results. Thanks  ---  Called pt and appt scheduled for Monday at 2pm. Nothing further needed

## 2015-03-20 ENCOUNTER — Ambulatory Visit (INDEPENDENT_AMBULATORY_CARE_PROVIDER_SITE_OTHER): Payer: Medicare Other | Admitting: Pulmonary Disease

## 2015-03-20 ENCOUNTER — Telehealth: Payer: Self-pay | Admitting: Pulmonary Disease

## 2015-03-20 ENCOUNTER — Encounter: Payer: Self-pay | Admitting: Pulmonary Disease

## 2015-03-20 VITALS — BP 126/84 | HR 59 | Ht 61.0 in | Wt 208.6 lb

## 2015-03-20 DIAGNOSIS — G4733 Obstructive sleep apnea (adult) (pediatric): Secondary | ICD-10-CM | POA: Diagnosis not present

## 2015-03-20 DIAGNOSIS — R05 Cough: Secondary | ICD-10-CM

## 2015-03-20 DIAGNOSIS — K219 Gastro-esophageal reflux disease without esophagitis: Secondary | ICD-10-CM

## 2015-03-20 DIAGNOSIS — R059 Cough, unspecified: Secondary | ICD-10-CM

## 2015-03-20 DIAGNOSIS — M069 Rheumatoid arthritis, unspecified: Secondary | ICD-10-CM

## 2015-03-20 DIAGNOSIS — R918 Other nonspecific abnormal finding of lung field: Secondary | ICD-10-CM

## 2015-03-20 MED ORDER — RANITIDINE HCL 150 MG PO TABS: 150.0000 mg | ORAL_TABLET | Freq: Every day | ORAL | Status: DC

## 2015-03-20 NOTE — Telephone Encounter (Signed)
IMAGING CT CHEST W/O 03/16/15 (personally reviewed by me): Patchy groundglass nodules within the left upper lobe. Groundglass nodularity also noted within right middle lobe & lingula. There is some consolidation within the opacification of the medial segment of the left lower lobe. No pleural effusion or thickening. Patent esophagus with dilation to the level of the cervical spine. No pathologic mediastinal adenopathy. No pericardial effusion. Radiology noted an 8 mm nodule within the right middle lobe as well. Compared with prior imaging there is no clear improvement or worsening.  CARDIAC TTE (12/21/14): LV normal in size with moderate concentric LVH. EF 65-70%. No regional wall motion abnormalities. Grade 1 diastolic dysfunction. RV poorly visualized but cavity size appeared normal with normal systolic function. Pulmonary artery systolic pressure 52 mmHg. LA mildly dilated. RA normal in size. Severe aortic stenosis. No mitral stenosis or regurgitation. Pulmonic valve poorly visualized. Mild tricuspid regurgitation.

## 2015-03-20 NOTE — Progress Notes (Signed)
Subjective:    Patient ID: Shaun Yoder, male    DOB: 05-Jul-1946, 69 y.o.   MRN: [id number]  [facility]  Follow-up for lung nodules. OSA, GERD, & rheumatoid arthritis.  HPI Lung Nodules:  He reports dyspnea on exertion. He does have an intermittent cough. He reports he produces a "yellow" phlegm, mostly in the morning. He does feel his cough is becoming more frequent. Previously was treated with a course of antibiotics without a significant improvement. He produces 1/4 cup daily. Denies any hemoptysis.   OSA:  He reports he tried to use CPAP remotely but had problems tolerate due to claustrophobia. He was diagnosed remotely. He sleeps 5-6 hours a night. He sleeps on his back due to shoulder pain. Wife denies any witnessed apneas. She reports heavy breathing. He denies any morning headaches. He naps on a daily basis. He does doze off easily.  GERD:  Reports he is compliant with Prilosec. He denies any dysphagia or odynophagia. He denies any reflux or dyspepsia recently. Denies any morning brash water taste.   Rheumatoid Arthritis:  Follows with Dr. Trudie Reed of rheumatology. He is currently on Mtx for treatment. He continues to have pain in his shoulders and wrists bilaterally. Continues to have stiffness in his hands lasting for hours in the morning. He has swelling in his wrists as well as PIP joints bilaterally but denies any erythema. Denies any rheumatoid nodules.   Review of Systems He denies any chest pain or pressure. No fever or chills. He reports rare sweats. No nausea or emesis. Occasional diarrhea.   Allergies  Allergen Reactions  . Leflunomide Other (See Comments)    diarrhea  . Morphine And Related Nausea And Vomiting   Current Outpatient Prescriptions on File Prior to Visit  Medication Sig Dispense Refill  . Abatacept 125 MG/ML SOSY Inject into the skin once a week.    . ARIPiprazole (ABILIFY) 30 MG tablet Take 30 mg by mouth at bedtime.      [name] aspirin 81 MG tablet Take 81  mg by mouth daily.      . calcium carbonate (OS-CAL) 600 MG TABS Take 1,200 mg by mouth daily.      . divalproex (DEPAKOTE ER) 500 MG 24 hr tablet 4 tabs at bedtime    . Fish Oil OIL Take 1 capsule by mouth daily.     . folic acid (FOLVITE) 1 MG tablet Take 1 mg by mouth daily.      [name] gabapentin (NEURONTIN) 300 MG capsule Take 300 mg by mouth 2 (two) times daily.     . meloxicam (MOBIC) 15 MG tablet Take 15 mg by mouth daily.    . methotrexate (RHEUMATREX) 2.5 MG tablet Take 25 mg by mouth once a week. Caution:Chemotherapy. Protect from light.     . metoprolol succinate (TOPROL-XL) 25 MG 24 hr tablet Take 12.5 mg by mouth daily.    . mirabegron ER (MYRBETRIQ) 50 MG TB24 tablet Take 50 mg by mouth daily.    . Multiple Vitamins-Minerals (MULTIVITAMINS THER. W/MINERALS) TABS Take 1 tablet by mouth daily.      [name] omeprazole (PRILOSEC) 20 MG capsule Take 40 mg by mouth every morning.      . ziprasidone (GEODON) 60 MG capsule 1 tablet every AM and PM and 2 at bedtime    . levofloxacin (LEVAQUIN) 750 MG tablet Take 1 tablet (750 mg total) by mouth daily. (Patient not taking: Reported on 03/20/2015) 7 tablet 0   No current facility-administered  medications on file prior to visit.   Past Medical History  Diagnosis Date  . Arthritis   . Cancer     bladder  . Nerves   . Varicose vein   . OA (osteoarthritis)   . Hyperlipidemia   . GERD (gastroesophageal reflux disease)   . Schizoaffective disorder, bipolar type   . Anemia   . Joint pain   . Iron deficiency   . Hypertension   . Low back pain   . Colitis   . Gastritis   . Depression   . Sleep apnea   . Colon polyps   . Diverticulosis   . History of rectal polyps    Past Surgical History  Procedure Laterality Date  . Total knee arthroplasty    . Bladder surgery    . Varicose vein surgery     Family History  Problem Relation Age of Onset  . Heart attack Mother   . Heart attack Father   . Stroke Father   . Rheumatologic disease Neg  Hx   . Cancer Neg Hx   . Lung disease Neg Hx    Social History   Social History  . Marital Status: Married    Spouse Name: N/A  . Number of Children: 2  . Years of Education: N/A   Occupational History  . retired    Social History Main Topics  . Smoking status: Former Smoker -- 2.50 packs/day for 43 years    Types: Cigarettes    Quit date: 07/22/1998  . Smokeless tobacco: None  . Alcohol Use: No  . Drug Use: No  . Sexual Activity: Not Asked   Other Topics Concern  . None   Social History Narrative   Originally from [place]. Moved to [facility] in July 22, 1985. 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 No known asbestos exposure. 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 No international travel. Has a dog currently. Remote exposure to Cockatiel in a different home. No mold exposure. No hot tub exposure.       Objective:   Physical Exam Blood pressure 126/84, pulse 59, height 5\' 1"  (1.549 m), weight 208 lb 9.6 oz (94.62 kg), SpO2 93 %. General:  Awake. Alert. No acute distress. Obese Caucasian male.  Integument:  Warm & dry. No rash on exposed skin. No bruising. Venous stasis changes bilateral lower extremities. Lymphatics:  No appreciated cervical or supraclavicular lymphadenoapthy. HEENT:  Tacky mucus membranes. No oral ulcers. No scleral injection or icterus. Mild bilateral nasal turbinate swelling. PERRL. Cardiovascular:  Regular rate. Bilateral lower extremity edema. No appreciable JVD given positioning. Varicose veins noted bilateral lower extremities. Pulmonary:  Good aeration & clear to auscultation bilaterally. Symmetric chest wall expansion. No accessory muscle use on room air. Abdomen: Soft. Normal bowel sounds. Protuberant. Grossly nontender. Musculoskeletal:  Normal bulk and tone. Synovial thickening bilateral PIP  joints. No joint deformity or effusion appreciated. Very small rheumatoid nodule noted adjacent to right elbow. Neurological:  CN 2-12 grossly in tact. No meningismus. Moving all 4 extremities equally. Symmetric patellar deep tendon reflexes. Psychiatric:  Mood and affect congruent. Speech normal rhythm, rate & tone.   IMAGING CT CHEST W/O 03/16/15 (personally reviewed by me): Patchy groundglass nodules within the left upper lobe. Groundglass nodularity also noted within right middle lobe & lingula. There is some consolidation within the opacification  of the medial segment of the left lower lobe. No pleural effusion or thickening. Patent esophagus with dilation to the level of the cervical spine. No pathologic mediastinal adenopathy. No pericardial effusion. Radiology noted an 8 mm nodule within the right middle lobe as well. Compared with prior imaging there is no clear improvement or worsening.  CARDIAC TTE (12/21/14): LV normal in size with moderate concentric LVH. EF 65-70%. No regional wall motion abnormalities. Grade 1 diastolic dysfunction. RV poorly visualized but cavity size appeared normal with normal systolic function. Pulmonary artery systolic pressure 52 mmHg. LA mildly dilated. RA normal in size. Severe aortic stenosis. No mitral stenosis or regurgitation. Pulmonic valve poorly visualized. Mild tricuspid regurgitation.    Assessment & Plan:  Patient is a 69 year old male with a long history of rheumatoid arthritis followed by Dr. Trudie Reed. Currently he is on methotrexate. He reports his joint pain does not seem to be severe or out of the normal. I reviewed his chest CT scan with both he and his wife who was present today. There certainly appears to be some element of consolidation with air bronchograms in the medial aspect of the left lower lobe. These changes could represent lung nodules and possibly early interstitial lung disease being caused by the patient's underlying rheumatoid arthritis.  Given the potential for infectious symptoms I do not feel that his immunosuppression needs to be increased at this time. Given the increased sputum production and character changes and atypical infectious process is certainly possible. Given the changes in his esophagus on CT imaging I do question whether or not he may be experiencing some silent laryngo-esophageal reflux that could possibly be contributing to an atypical infection. It does seem as though he is having some symptoms from his underlying OSA but given the remote testing that was done and prior problems with claustrophobia I'm going to hold off on repeat testing at this time. I instructed the patient and his wife to contact me if they had any further questions or concerns before his next appointment.  1. Cough: Checking sputum culture for AFB, fungus, and bacteria. 2. Multiple lung nodules on CT scan: Secondary to RA versus atypical infection. Checking sputum culture. Consider repeat CT imaging in 6 months. Patient continuing on methotrexate for treatment of rheumatoid arthritis. 3. GERD with esophageal dilation: Checking barium swallow. Patient counseled to avoid eating within 3 hours of bedtime. Starting Zantac 150 milligrams by mouth daily at bedtime in addition to daily Prilosec. 4. OSA: Patient is having some symptoms of daytime drowsiness. Consider repeat polysomnogram after next appointment. 5. Rheumatoid arthritis: Follows with Dr. Trudie Reed. Currently on methotrexate. Continues to have joint pain and stiffness but per patient's report appears stable. 6. Follow-up: Patient to return to clinic in 4-6 weeks or sooner if needed.

## 2015-03-20 NOTE — Patient Instructions (Signed)
1. Avoid eating or drinking within 3 hours of bedtime. 2. Continue taking your omeprazole. We are starting you on Zantac before bedtime to help with your cough. 3. I'm checking a swallowing study to determine if you are having any reflux or problem with your esophagus. 4. We are checking a culture of her sputum to see if there is an organism that could be contributing to your cough. Please remember to drop off the specimen within 4 hours of bringing it up and do not refrigerate. 5. Please contact my office if her cough gets worse; he develop any fever, chills, or sweats; or you develop any bloody mucus. 6. I will see you back in 4-6 weeks but please call if you have any further questions or concerns.

## 2015-03-21 ENCOUNTER — Telehealth: Payer: Self-pay | Admitting: Pulmonary Disease

## 2015-03-21 ENCOUNTER — Other Ambulatory Visit: Payer: Medicare Other

## 2015-03-21 DIAGNOSIS — R05 Cough: Secondary | ICD-10-CM

## 2015-03-21 DIAGNOSIS — R059 Cough, unspecified: Secondary | ICD-10-CM

## 2015-03-21 DIAGNOSIS — R918 Other nonspecific abnormal finding of lung field: Secondary | ICD-10-CM

## 2015-03-21 NOTE — Telephone Encounter (Signed)
This has been taken care of.

## 2015-03-21 NOTE — Addendum Note (Signed)
Addended by: Inge Rise on: 03/21/2015 02:06 PM   Modules accepted: Orders

## 2015-03-22 ENCOUNTER — Encounter: Payer: Self-pay | Admitting: Internal Medicine

## 2015-03-22 ENCOUNTER — Ambulatory Visit (INDEPENDENT_AMBULATORY_CARE_PROVIDER_SITE_OTHER): Payer: Medicare Other | Admitting: Internal Medicine

## 2015-03-22 VITALS — BP 122/74 | HR 50 | Temp 97.6°F | Ht 63.0 in | Wt 208.0 lb

## 2015-03-22 DIAGNOSIS — I350 Nonrheumatic aortic (valve) stenosis: Secondary | ICD-10-CM

## 2015-03-22 DIAGNOSIS — Z09 Encounter for follow-up examination after completed treatment for conditions other than malignant neoplasm: Secondary | ICD-10-CM

## 2015-03-22 DIAGNOSIS — H6121 Impacted cerumen, right ear: Secondary | ICD-10-CM

## 2015-03-22 DIAGNOSIS — I83009 Varicose veins of unspecified lower extremity with ulcer of unspecified site: Secondary | ICD-10-CM | POA: Diagnosis not present

## 2015-03-22 DIAGNOSIS — I10 Essential (primary) hypertension: Secondary | ICD-10-CM | POA: Diagnosis not present

## 2015-03-22 DIAGNOSIS — F250 Schizoaffective disorder, bipolar type: Secondary | ICD-10-CM

## 2015-03-22 DIAGNOSIS — L97909 Non-pressure chronic ulcer of unspecified part of unspecified lower leg with unspecified severity: Principal | ICD-10-CM

## 2015-03-22 MED ORDER — COLLAGENASE 250 UNIT/GM EX OINT: 1.0000 "application " | TOPICAL_OINTMENT | Freq: Every day | CUTANEOUS | Status: DC

## 2015-03-22 MED ORDER — CEPHALEXIN 500 MG PO CAPS: 500.0000 mg | ORAL_CAPSULE | Freq: Four times a day (QID) | ORAL | Status: DC

## 2015-03-22 NOTE — Progress Notes (Signed)
Subjective:    Patient ID: Shaun Yoder, male    DOB: 01-30-1946, 69 y.o.   MRN: [id number]  DOS:  03/22/2015 Type of visit - description : New patient, here to establish,transferring from [place] here with his wife. Interval history: Hypertension: Good compliance of medication, BP today is very good Bipolar disorder: Good compliance of medication, follow-up at the [place] History of varicose veins, has a history of lower extremity ulcers, having a ulcer  on the right leg for the last 3 weeks. Edema is about at baseline. Likes his ear checked, he has frequent wax accumulation, denies any pain or ear discharge History of aortic stenosis, saw Dr. Burt Knack 11-2014. Note reviewed   Review of Systems More than 50 pages of  records from previous PCP reviewed, only the most relevant ones will be scan.  Past Medical History  Diagnosis Date  . Cancer     bladder  . Varicose vein   . OA (osteoarthritis)   . Hyperlipidemia   . GERD (gastroesophageal reflux disease)   . Schizoaffective disorder, bipolar type   . Anemia   . Iron deficiency   . Hypertension   . Low back pain   . Colitis   . Gastritis   . Depression   . Sleep apnea   . Colon polyps   . Diverticulosis   . History of rectal polyps     Past Surgical History  Procedure Laterality Date  . Total knee arthroplasty Bilateral   . Bladder surgery    . Varicose vein surgery      Social History   Social History  . Marital Status: Married    Spouse Name: N/A  . Number of Children: 2  . Years of Education: N/A   Occupational History  . retired    Social History Main Topics  . Smoking status: Former Smoker -- 2.50 packs/day for 43 years    Types: Cigarettes    Quit date: 07/22/1998  . Smokeless tobacco: Not on file  . Alcohol Use: No  . Drug Use: No  . Sexual Activity: Not on file   Other Topics Concern  . Not on file   Social History Narrative   Originally from [place]. Moved to [name] in July 22, 1985. 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 No known asbestos exposure. 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 No international travel. Has a dog currently. Remote exposure to Cockatiel in a different home. No mold exposure. No hot tub exposure.       Two adopted children     Family History  Problem Relation Age of Onset  . Heart attack Mother   . Heart attack Father   . Stroke Father   . Rheumatologic disease Neg Hx   . Colon cancer Neg Hx   . Lung disease Neg Hx   . Prostate cancer Neg Hx        Medication List       This list is accurate as of: 03/22/15  4:09 PM.  Always use your most recent med list.               Abatacept 125 MG/ML Sosy  Inject into the skin once a week.     ARIPiprazole 30 MG tablet  Commonly known as:  ABILIFY  Take 30 mg by mouth at  bedtime.     aspirin 81 MG tablet  Take 81 mg by mouth daily.     calcium carbonate 600 MG Tabs tablet  Commonly known as:  OS-CAL  Take 1,200 mg by mouth daily.     divalproex 500 MG 24 hr tablet  Commonly known as:  DEPAKOTE ER  4 tabs at bedtime     Fish Oil Oil  Take 1 capsule by mouth daily.     folic acid 1 MG tablet  Commonly known as:  FOLVITE  Take 1 mg by mouth daily.     gabapentin 300 MG capsule  Commonly known as:  NEURONTIN  Take 300 mg by mouth 2 (two) times daily.     meloxicam 15 MG tablet  Commonly known as:  MOBIC  Take 15 mg by mouth daily.     methotrexate 2.5 MG tablet  Commonly known as:  RHEUMATREX  Take 25 mg by mouth once a week. Caution:Chemotherapy. Protect from light.     metoprolol succinate 25 MG 24 hr tablet  Commonly known as:  TOPROL-XL  Take 12.5 mg by mouth daily.     mirabegron ER 50 MG Tb24 tablet  Commonly known as:  MYRBETRIQ  Take 50 mg by mouth daily.     multivitamins ther. w/minerals Tabs tablet  Take  1 tablet by mouth daily.     omeprazole 20 MG capsule  Commonly known as:  PRILOSEC  Take 40 mg by mouth every morning.     ranitidine 150 MG tablet  Commonly known as:  ZANTAC  Take 1 tablet (150 mg total) by mouth at bedtime.     ziprasidone 60 MG capsule  Commonly known as:  GEODON  1 tablet every AM and PM and 2 at bedtime           Objective:   Physical Exam BP 122/74 mmHg  Pulse 50  Temp(Src) 97.6 F (36.4 C) (Oral)  Ht 5\' 3"  (1.6 m)  Wt 208 lb (94.348 kg)  BMI 36.85 kg/m2  SpO2 95% General:   Well developed, well nourished . NAD.  HEENT:  Normocephalic . Face symmetric, atraumatic Lungs:  CTA B Normal respiratory effort, no intercostal retractions, no accessory muscle use. Heart: Regular, significant systolic murmur.  Lower extremities: Has significant varicose veins, no phlebitis Skin from the knees down is thin and  hyperpigmented. At the external aspect  of the right ankle has a 1.5 cm round superficial ulcer with a wet scab. Minimal redness around it. No purulent d/c +/+++ Pretibial pitting edema Neurologic:  alert & oriented X3.  Speech normal, gait quite difficult, assisted with a cane  Psych--  Cognition and judgment appear intact.  Cooperative with normal attention span and concentration.  Behavior appropriate. No anxious or depressed appearing.      Assessment & Plan:   Problem list >  Hypertension Hyperlipidemia Ao stenosis, severe: Dr Burt Knack MS[facility]: Seronegative Rheumatoid arthritis DJD low back pain d/t DJD Sees Dr. [name], Dr Tamera Punt (ortho) ---- Schizophrenia, bipolar, depression: Follow-up at the [facility] in [facility] Bladder cancer-transitional cell, Dr. Shona Needles History of iron deficiency anemia GI:   GERD, history of colitis, history of gastritis, colon polyps, diverticulosis Pulmomany: Sleep apnea , CT nodules : Dr Ashok Cordia, intolerant to CPAP Varicose veins: Status post remote surgery   Assessment and plan  Hypertension:  Well controlled with metoprolol Aortic stenosis: Chart reviewed, saw Dr. Burt Knack 11/2014, declined a cardiac catheterization or further evaluation. Schizophrenia: Seems to be stable Varicose  veins, lower extremity ulcer: Suspect ulcer related to varicose veins, given aortic stenosis  we need to be very cautious with diuretics. Recommend leg elevation, low-salt diet, antibiotics x few days, if not improving soon will need to see the [facility]. Cerumen impaction, partially  clean the R ear with a spoon by me Primary Care: had a Tdap Boostrix 10/23/2010 More than 35 minutes with the patient, get records Return to clinic in 3 months

## 2015-03-22 NOTE — Patient Instructions (Signed)
Leg elevation Low-salt diet Santyl  daily Use the compression stockings daily Keflex for one week If not improving in the next 2 weeks, if the area gets worse: Please call the office, will need a wound care center referral    Please sign a release of information and get the records from Dr. Rex Kras   Next visit  for a   routine checkup in 2 months.    Please schedule an appointment at the front desk   No need to come back fasting

## 2015-03-22 NOTE — Progress Notes (Signed)
Pre visit review using our clinic review tool, if applicable. No additional management support is needed unless otherwise documented below in the visit note. 

## 2015-03-23 ENCOUNTER — Telehealth: Payer: Self-pay | Admitting: Internal Medicine

## 2015-03-23 NOTE — Telephone Encounter (Signed)
Spoke with Pt's wife, Janae Bridgeman, informed her that Dr. Larose Kells doesn't know of any other generic prescription ointment that would help, but can recommend OTC antibiotic ointment such as Neosporin, and I also informed we could do a Wound Clinic referral for treat of ulcers. Arlene declined referral at this time but stated that they will try the Neosporin, compression stockings, antibiotics and elevating his legs over the weekend. Arlene stated if she doesn't see improvement by Tuesday, September 6, she would call for a referral.

## 2015-03-23 NOTE — Telephone Encounter (Signed)
Please advise 

## 2015-03-23 NOTE — Telephone Encounter (Signed)
I don't know of a generic similar medication.

## 2015-03-23 NOTE — Telephone Encounter (Signed)
Pt went to pick up collagenase (SANTYL) ointment at [facility] on [address] and it was $93. They did not take it. Is there something similar that would be less expensive? Please notify pt

## 2015-03-24 ENCOUNTER — Ambulatory Visit (HOSPITAL_COMMUNITY)
Admission: RE | Admit: 2015-03-24 | Discharge: 2015-03-24 | Disposition: A | Discharge status: Home / Self Care | Payer: Medicare Other | Source: Ambulatory Visit | Attending: Pulmonary Disease | Admitting: Pulmonary Disease

## 2015-03-24 DIAGNOSIS — K219 Gastro-esophageal reflux disease without esophagitis: Secondary | ICD-10-CM | POA: Insufficient documentation

## 2015-03-24 DIAGNOSIS — K224 Dyskinesia of esophagus: Secondary | ICD-10-CM | POA: Diagnosis not present

## 2015-03-24 DIAGNOSIS — M069 Rheumatoid arthritis, unspecified: Secondary | ICD-10-CM | POA: Diagnosis not present

## 2015-03-24 LAB — RESPIRATORY CULTURE OR RESPIRATORY AND SPUTUM CULTURE
Culture: NORMAL
Organism ID, Bacteria: NORMAL

## 2015-03-28 DIAGNOSIS — I10 Essential (primary) hypertension: Secondary | ICD-10-CM | POA: Insufficient documentation

## 2015-03-28 DIAGNOSIS — Z09 Encounter for follow-up examination after completed treatment for conditions other than malignant neoplasm: Secondary | ICD-10-CM | POA: Insufficient documentation

## 2015-03-28 NOTE — Assessment & Plan Note (Signed)
Hypertension: Well controlled with metoprolol Aortic stenosis: Chart reviewed, saw Dr. Burt Knack 11/2014, declined a cardiac catheterization or further evaluation. Schizophrenia: Seems to be stable Varicose veins, lower extremity ulcer: Suspect ulcer related to varicose veins, given aortic stenosis  we need to be very cautious with diuretics. Recommend leg elevation, low-salt diet, antibiotics x few days, if not improving soon will need to see the [facility]. Cerumen impaction, partially  clean the R ear with a spoon by me Primary Care: had a Tdap Boostrix 10/23/2010 More than 35 minutes with the patient, get records Return to clinic in 3 months

## 2015-03-31 DIAGNOSIS — D224 Melanocytic nevi of scalp and neck: Secondary | ICD-10-CM | POA: Diagnosis not present

## 2015-04-03 ENCOUNTER — Telehealth: Payer: Self-pay | Admitting: Pulmonary Disease

## 2015-04-03 NOTE — Telephone Encounter (Signed)
Go ahead and relay the results of his swallowing evaluation. Simply put it is consistent with chronic esophageal reflux. His sputum cultures haven't shown any growth of an organism that would be causing his symptoms yet but they are still being incubated. Thanks.

## 2015-04-03 NOTE — Telephone Encounter (Signed)
Called pt. He is requesting his sputum results and his swallowing test results. Please advise Dr. Ashok Cordia thanks

## 2015-04-04 NOTE — Telephone Encounter (Signed)
I spoke with patient about results and he verbalized understanding and had no questions 

## 2015-04-06 ENCOUNTER — Telehealth: Payer: Self-pay | Admitting: Internal Medicine

## 2015-04-06 DIAGNOSIS — I83019 Varicose veins of right lower extremity with ulcer of unspecified site: Secondary | ICD-10-CM

## 2015-04-06 DIAGNOSIS — L97919 Non-pressure chronic ulcer of unspecified part of right lower leg with unspecified severity: Principal | ICD-10-CM

## 2015-04-06 NOTE — Telephone Encounter (Signed)
Shaun Yoder, wife Ph# [phone]  Wounds on pt legs are not improving. He has completed course of abx and has been using neosporin as recommended by Shaun Yoder. Shaun Yoder states that Shaun Yoder told them if it was not improving he would refer to wound center. Pt has been to [place] before to Dr. Jerline Yoder (if he is still there).

## 2015-04-07 NOTE — Telephone Encounter (Signed)
Referral placed.

## 2015-04-07 NOTE — Telephone Encounter (Signed)
Yes, dx varicose ulcer at leg

## 2015-04-07 NOTE — Telephone Encounter (Signed)
Okay to place referral to Wound Clinic?

## 2015-04-21 LAB — FUNGUS CULTURE W SMEAR: SMEAR RESULT: NONE SEEN

## 2015-04-24 ENCOUNTER — Encounter (HOSPITAL_BASED_OUTPATIENT_CLINIC_OR_DEPARTMENT_OTHER): Payer: Medicare Other | Attending: Plastic Surgery

## 2015-04-24 DIAGNOSIS — G629 Polyneuropathy, unspecified: Secondary | ICD-10-CM | POA: Insufficient documentation

## 2015-04-24 DIAGNOSIS — I83013 Varicose veins of right lower extremity with ulcer of ankle: Secondary | ICD-10-CM | POA: Insufficient documentation

## 2015-04-24 DIAGNOSIS — L97811 Non-pressure chronic ulcer of other part of right lower leg limited to breakdown of skin: Secondary | ICD-10-CM | POA: Diagnosis not present

## 2015-04-24 DIAGNOSIS — I739 Peripheral vascular disease, unspecified: Secondary | ICD-10-CM | POA: Insufficient documentation

## 2015-04-24 DIAGNOSIS — L97311 Non-pressure chronic ulcer of right ankle limited to breakdown of skin: Secondary | ICD-10-CM | POA: Insufficient documentation

## 2015-04-24 DIAGNOSIS — Z87891 Personal history of nicotine dependence: Secondary | ICD-10-CM | POA: Diagnosis not present

## 2015-04-24 DIAGNOSIS — M069 Rheumatoid arthritis, unspecified: Secondary | ICD-10-CM | POA: Insufficient documentation

## 2015-04-24 DIAGNOSIS — I83023 Varicose veins of left lower extremity with ulcer of ankle: Secondary | ICD-10-CM | POA: Diagnosis present

## 2015-04-24 DIAGNOSIS — I10 Essential (primary) hypertension: Secondary | ICD-10-CM | POA: Diagnosis not present

## 2015-04-24 DIAGNOSIS — I83018 Varicose veins of right lower extremity with ulcer other part of lower leg: Secondary | ICD-10-CM | POA: Diagnosis not present

## 2015-04-24 DIAGNOSIS — G4730 Sleep apnea, unspecified: Secondary | ICD-10-CM | POA: Insufficient documentation

## 2015-04-24 DIAGNOSIS — I350 Nonrheumatic aortic (valve) stenosis: Secondary | ICD-10-CM | POA: Diagnosis not present

## 2015-04-26 ENCOUNTER — Ambulatory Visit: Payer: Federal, State, Local not specified - PPO | Admitting: Pulmonary Disease

## 2015-04-27 DIAGNOSIS — L97111 Non-pressure chronic ulcer of right thigh limited to breakdown of skin: Secondary | ICD-10-CM | POA: Diagnosis not present

## 2015-04-27 DIAGNOSIS — M0569 Rheumatoid arthritis of multiple sites with involvement of other organs and systems: Secondary | ICD-10-CM | POA: Diagnosis not present

## 2015-04-27 DIAGNOSIS — I83013 Varicose veins of right lower extremity with ulcer of ankle: Secondary | ICD-10-CM | POA: Diagnosis not present

## 2015-04-28 ENCOUNTER — Ambulatory Visit (HOSPITAL_COMMUNITY)
Admission: RE | Admit: 2015-04-28 | Discharge: 2015-04-28 | Disposition: A | Discharge status: Home / Self Care | Payer: Medicare Other | Source: Ambulatory Visit | Attending: Vascular Surgery | Admitting: Vascular Surgery

## 2015-04-28 ENCOUNTER — Other Ambulatory Visit (HOSPITAL_COMMUNITY): Payer: Self-pay | Admitting: Plastic Surgery

## 2015-04-28 ENCOUNTER — Ambulatory Visit (INDEPENDENT_AMBULATORY_CARE_PROVIDER_SITE_OTHER)
Admission: RE | Admit: 2015-04-28 | Discharge: 2015-04-28 | Disposition: A | Discharge status: Home / Self Care | Payer: Medicare Other | Source: Ambulatory Visit | Attending: Vascular Surgery | Admitting: Vascular Surgery

## 2015-04-28 DIAGNOSIS — L97319 Non-pressure chronic ulcer of right ankle with unspecified severity: Secondary | ICD-10-CM

## 2015-04-28 DIAGNOSIS — I10 Essential (primary) hypertension: Secondary | ICD-10-CM | POA: Insufficient documentation

## 2015-04-28 DIAGNOSIS — I8001 Phlebitis and thrombophlebitis of superficial vessels of right lower extremity: Secondary | ICD-10-CM | POA: Diagnosis not present

## 2015-04-28 DIAGNOSIS — I83891 Varicose veins of right lower extremities with other complications: Secondary | ICD-10-CM | POA: Insufficient documentation

## 2015-04-28 DIAGNOSIS — Z87891 Personal history of nicotine dependence: Secondary | ICD-10-CM | POA: Insufficient documentation

## 2015-05-01 DIAGNOSIS — I83018 Varicose veins of right lower extremity with ulcer other part of lower leg: Secondary | ICD-10-CM | POA: Diagnosis not present

## 2015-05-01 DIAGNOSIS — Z87891 Personal history of nicotine dependence: Secondary | ICD-10-CM | POA: Diagnosis not present

## 2015-05-01 DIAGNOSIS — G4730 Sleep apnea, unspecified: Secondary | ICD-10-CM | POA: Diagnosis not present

## 2015-05-01 DIAGNOSIS — L97311 Non-pressure chronic ulcer of right ankle limited to breakdown of skin: Secondary | ICD-10-CM | POA: Diagnosis not present

## 2015-05-01 DIAGNOSIS — L97811 Non-pressure chronic ulcer of other part of right lower leg limited to breakdown of skin: Secondary | ICD-10-CM | POA: Diagnosis not present

## 2015-05-01 DIAGNOSIS — I83013 Varicose veins of right lower extremity with ulcer of ankle: Secondary | ICD-10-CM | POA: Diagnosis not present

## 2015-05-03 LAB — AFB CULTURE WITH SMEAR (NOT AT ARMC): ACID FAST SMEAR: NONE SEEN

## 2015-05-08 DIAGNOSIS — L97811 Non-pressure chronic ulcer of other part of right lower leg limited to breakdown of skin: Secondary | ICD-10-CM | POA: Diagnosis not present

## 2015-05-08 DIAGNOSIS — G4730 Sleep apnea, unspecified: Secondary | ICD-10-CM | POA: Diagnosis not present

## 2015-05-08 DIAGNOSIS — L97311 Non-pressure chronic ulcer of right ankle limited to breakdown of skin: Secondary | ICD-10-CM | POA: Diagnosis not present

## 2015-05-08 DIAGNOSIS — I83018 Varicose veins of right lower extremity with ulcer other part of lower leg: Secondary | ICD-10-CM | POA: Diagnosis not present

## 2015-05-08 DIAGNOSIS — I83013 Varicose veins of right lower extremity with ulcer of ankle: Secondary | ICD-10-CM | POA: Diagnosis not present

## 2015-05-08 DIAGNOSIS — Z87891 Personal history of nicotine dependence: Secondary | ICD-10-CM | POA: Diagnosis not present

## 2015-05-11 ENCOUNTER — Encounter: Payer: Self-pay | Admitting: Pulmonary Disease

## 2015-05-11 ENCOUNTER — Ambulatory Visit (INDEPENDENT_AMBULATORY_CARE_PROVIDER_SITE_OTHER)
Admission: RE | Admit: 2015-05-11 | Discharge: 2015-05-11 | Disposition: A | Discharge status: Home / Self Care | Payer: Medicare Other | Source: Ambulatory Visit | Attending: Pulmonary Disease | Admitting: Pulmonary Disease

## 2015-05-11 ENCOUNTER — Ambulatory Visit (INDEPENDENT_AMBULATORY_CARE_PROVIDER_SITE_OTHER): Payer: Medicare Other | Admitting: Pulmonary Disease

## 2015-05-11 VITALS — BP 118/70 | HR 53 | Wt 210.4 lb

## 2015-05-11 DIAGNOSIS — K219 Gastro-esophageal reflux disease without esophagitis: Secondary | ICD-10-CM

## 2015-05-11 DIAGNOSIS — R059 Cough, unspecified: Secondary | ICD-10-CM

## 2015-05-11 DIAGNOSIS — R918 Other nonspecific abnormal finding of lung field: Secondary | ICD-10-CM

## 2015-05-11 DIAGNOSIS — G4733 Obstructive sleep apnea (adult) (pediatric): Secondary | ICD-10-CM

## 2015-05-11 DIAGNOSIS — R05 Cough: Secondary | ICD-10-CM

## 2015-05-11 MED ORDER — RANITIDINE HCL 150 MG PO TABS: 150.0000 mg | ORAL_TABLET | Freq: Every day | ORAL | Status: DC

## 2015-05-11 NOTE — Patient Instructions (Signed)
1. I will contact you with your new sputum culture grows out any bugs that we need to treat with antibiotics. 2. We will check breathing test at her next appointment. 3. Continue taking the omeprazole & Zantac as prescribed. 4. Please call my office if your cough or breathing get worse. 5. I will see back in 1-2 months.

## 2015-05-11 NOTE — Addendum Note (Signed)
Addended by: Inge Rise on: 05/11/2015 03:45 PM   Modules accepted: Orders

## 2015-05-11 NOTE — Progress Notes (Signed)
Subjective:    Patient ID: Shaun Yoder, male    DOB: July 05, 1946, 69 y.o.   MRN: [id number]  [facility].:  Follow-up for Cough, Lung Nodules. OSA, GERD, & Rheumatoid Arthritis.  HPI  Cough: Sputum culture showed no evidence for fungal or AFB infection. He feels his cough is worsening over the last 2-3 weeks. His cough produces a "yellow" mucus all day long. He reports some mild wheezing. He reports his baseline dyspnea. Denies any hemoptysis. Denies any sore throat. He reports producing 1-2 tablespoons daily.  Lung Nodules:  Likely secondary to underlying rheumatoid arthritis. Sputum culture had no organisms cultured.  OSA:  Not currently on CPAP therapy. Diagnosed remotely. Unable to use CPAP due to claustrophobia.  GERD:  Barium swallow limited by mobility. Esophageal dysmotility noted consistent with chronic reflux. Started on Zantac at last appointment. Reports he is compliant with Zantac. He is still taking Omeprazole. No reflux, dyspepsia, or morning brash water taste.  Rheumatoid Arthritis:  Follows with Dr. Trudie Reed of rheumatology. He is currently on methotrexate for treatment.   Review of Systems No fever, chills, or sweats. No chest pain or pressure. He denies any sinus pressure or pain. He does have chronic clear sinus drainage that seems to be more.   Allergies  Allergen Reactions  . Leflunomide Other (See Comments)    diarrhea  . Morphine And Related Nausea And Vomiting   Current Outpatient Prescriptions on File Prior to Visit  Medication Sig Dispense Refill  . Abatacept 125 MG/ML SOSY Inject into the skin once a week.    . ARIPiprazole (ABILIFY) 30 MG tablet Take 30 mg by mouth at bedtime.      [name] aspirin 81 MG tablet Take 81 mg by mouth daily.      . calcium carbonate (OS-CAL) 600 MG TABS Take 1,200 mg by mouth daily.      . collagenase (SANTYL) ointment Apply 1 application topically daily. 30 g 0  . divalproex (DEPAKOTE ER) 500 MG 24 hr tablet 4 tabs at bedtime    .  Fish Oil OIL Take 1 capsule by mouth daily.     . folic acid (FOLVITE) 1 MG tablet Take 1 mg by mouth daily.      [name] gabapentin (NEURONTIN) 300 MG capsule Take 300 mg by mouth 2 (two) times daily.     . meloxicam (MOBIC) 15 MG tablet Take 15 mg by mouth daily.    . methotrexate (RHEUMATREX) 2.5 MG tablet Take 25 mg by mouth once a week. Caution:Chemotherapy. Protect from light.     . metoprolol succinate (TOPROL-XL) 25 MG 24 hr tablet Take 12.5 mg by mouth daily.    . mirabegron ER (MYRBETRIQ) 50 MG TB24 tablet Take 50 mg by mouth daily.    . Multiple Vitamins-Minerals (MULTIVITAMINS THER. W/MINERALS) TABS Take 1 tablet by mouth daily.      [name] omeprazole (PRILOSEC) 20 MG capsule Take 40 mg by mouth every morning.      . ranitidine (ZANTAC) 150 MG tablet Take 1 tablet (150 mg total) by mouth at bedtime. 30 tablet 3  . ziprasidone (GEODON) 60 MG capsule 1 tablet every AM and PM and 2 at bedtime     No current facility-administered medications on file prior to visit.   Past Medical History  Diagnosis Date  . Cancer ([place])     bladder  . Varicose vein   . OA (osteoarthritis)   . Hyperlipidemia   . GERD (gastroesophageal reflux disease)   .  Schizoaffective disorder, bipolar type ([place])   . Anemia   . Iron deficiency   . Hypertension   . Low back pain   . Colitis   . Gastritis   . Depression   . Sleep apnea   . Colon polyps   . Diverticulosis   . History of rectal polyps    Past Surgical History  Procedure Laterality Date  . Total knee arthroplasty Bilateral   . Bladder surgery    . Varicose vein surgery     Family History  Problem Relation Age of Onset  . Heart attack Mother   . Heart attack Father   . Stroke Father   . Rheumatologic disease Neg Hx   . Colon cancer Neg Hx   . Lung disease Neg Hx   . Prostate cancer Neg Hx    Social History   Social History  . Marital Status: Married    Spouse Name: N/A  . Number of Children: 2  . Years of Education: N/A    Occupational History  . retired    Social History Main Topics  . Smoking status: Former Smoker -- 2.50 packs/day for 43 years    Types: Cigarettes    Quit date: 07/22/1998  . Smokeless tobacco: None  . Alcohol Use: No  . Drug Use: No  . Sexual Activity: Not Asked   Other Topics Concern  . None   Social History Narrative   Originally from [place]. Moved to [facility] in July 22, 1985. 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 No known asbestos exposure. 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 No international travel. Has a dog currently. Remote exposure to Cockatiel in a different home. No mold exposure. No hot tub exposure.       Two adopted children      Objective:   Physical Exam Blood pressure 118/70, pulse 53, weight 210 lb 6.4 oz (95.437 kg), SpO2 95 %. General:  Awake. Alert. No acute distress. Obese Caucasian male.  Integument:  Warm & dry. No rash on exposed skin. No bruising. Venous stasis changes bilateral lower extremities. HEENT:  Tacky mucus membranes. No oral ulcers. No scleral injection or icterus. Mild bilateral nasal turbinate swelling. PERRL. Cardiovascular:  Regular rate. Bilateral lower extremity edema. No appreciable JVD given positioning. Varicose veins noted bilateral lower extremities. Pulmonary:  Good aeration & clear to auscultation bilaterally. Symmetric chest wall expansion. No accessory muscle use on room air. Abdomen: Soft. Normal bowel sounds. Protuberant. Grossly nontender. Musculoskeletal:  Normal bulk and tone. Synovial thickening bilateral PIP joints. No joint deformity or effusion appreciated. Very small rheumatoid nodule noted adjacent to right elbow.  IMAGING BARIUM SWALLOW (03/24/15): No aspiration or laryngeal penetration. Moderate esophageal dysmotility suggestive of chronic reflux. No hiatal  hernia. No reflux elicited. No mass or stricture detected. Reflux evaluation was somewhat limited due to patient's immobility. Barium tablet became lodged in the vallecula.  CT CHEST W/O 03/16/15 (previously reviewed by me): Patchy groundglass nodules within the left upper lobe. Groundglass nodularity also noted within right middle lobe & lingula. There is some consolidation within the opacification of the medial segment of the left lower lobe. No pleural effusion or thickening. Patent esophagus with dilation to the level of the cervical spine. No pathologic mediastinal adenopathy. No pericardial effusion. Radiology noted an 8 mm nodule within the right middle lobe  as well. Compared with prior imaging there is no clear improvement or worsening.  CARDIAC TTE (12/21/14): LV normal in size with moderate concentric LVH. EF 65-70%. No regional wall motion abnormalities. Grade 1 diastolic dysfunction. RV poorly visualized but cavity size appeared normal with normal systolic function. Pulmonary artery systolic pressure 52 mmHg. LA mildly dilated. RA normal in size. Severe aortic stenosis. No mitral stenosis or regurgitation. Pulmonic valve poorly visualized. Mild tricuspid regurgitation.  MICROBIOLOGY SPUTUM (03/21/15):  Oral Flora / AFB negative / Fungus negative    Assessment & Plan:  Patient is a 69 year old male with a long history of rheumatoid arthritis followed by Dr. Trudie Reed currently treated with methotrexate. Cough seems to worsen mildly over the last couple weeks. Patient has no other infectious symptoms at this time. I do question whether or not he may have some underlying COPD given his prior history of tobacco use. Symptomatically his reflux seems to be well-controlled and given the findings on his grams swallow I would favor continuing dual agent therapy. I'm holding off on initiating any antibiotic or inhaler therapies at this time pending further test results. I instructed the patient contact my  office if he had any clinical worsening before his next appointment.  1. Cough: Checking sputum culture for AFB, fungus, and bacteria. Also checking chest x-ray PA/LAT today. Checking full pulmonary function testing at her next appointment. 2. Bilateral lung nodules: Suspect secondary to underlying rheumatoid arthritis. Checking sputum culture. Checking chest x-ray PA/LAT. Last CT scan August 2016. 3. OSA: Patient previously intolerant of CPAP therapy due to claustrophobia. Defer repeat testing at this time. 4. GERD with esophageal dilation: Continuing patient on omeprazole & Zantac. No changes. 5. Health maintenance: Patient plans to get the influenza vaccine at his follow-up appointment with Dr. Trudie Reed. 6. Follow-up: Patient to return to clinic in 1-2 months or sooner if needed.

## 2015-05-15 DIAGNOSIS — G4730 Sleep apnea, unspecified: Secondary | ICD-10-CM | POA: Diagnosis not present

## 2015-05-15 DIAGNOSIS — I83018 Varicose veins of right lower extremity with ulcer other part of lower leg: Secondary | ICD-10-CM | POA: Diagnosis not present

## 2015-05-15 DIAGNOSIS — Z87891 Personal history of nicotine dependence: Secondary | ICD-10-CM | POA: Diagnosis not present

## 2015-05-15 DIAGNOSIS — I83013 Varicose veins of right lower extremity with ulcer of ankle: Secondary | ICD-10-CM | POA: Diagnosis not present

## 2015-05-15 DIAGNOSIS — L97811 Non-pressure chronic ulcer of other part of right lower leg limited to breakdown of skin: Secondary | ICD-10-CM | POA: Diagnosis not present

## 2015-05-15 DIAGNOSIS — L97311 Non-pressure chronic ulcer of right ankle limited to breakdown of skin: Secondary | ICD-10-CM | POA: Diagnosis not present

## 2015-05-15 NOTE — Progress Notes (Signed)
Quick Note:  lmtcb for pt. ______ 

## 2015-05-22 DIAGNOSIS — I83013 Varicose veins of right lower extremity with ulcer of ankle: Secondary | ICD-10-CM | POA: Diagnosis not present

## 2015-05-22 DIAGNOSIS — L97311 Non-pressure chronic ulcer of right ankle limited to breakdown of skin: Secondary | ICD-10-CM | POA: Diagnosis not present

## 2015-05-22 DIAGNOSIS — I83018 Varicose veins of right lower extremity with ulcer other part of lower leg: Secondary | ICD-10-CM | POA: Diagnosis not present

## 2015-05-22 DIAGNOSIS — Z87891 Personal history of nicotine dependence: Secondary | ICD-10-CM | POA: Diagnosis not present

## 2015-05-22 DIAGNOSIS — G4730 Sleep apnea, unspecified: Secondary | ICD-10-CM | POA: Diagnosis not present

## 2015-05-22 DIAGNOSIS — L97811 Non-pressure chronic ulcer of other part of right lower leg limited to breakdown of skin: Secondary | ICD-10-CM | POA: Diagnosis not present

## 2015-05-23 ENCOUNTER — Encounter: Payer: Self-pay | Admitting: Internal Medicine

## 2015-05-23 ENCOUNTER — Ambulatory Visit (INDEPENDENT_AMBULATORY_CARE_PROVIDER_SITE_OTHER): Payer: Medicare Other | Admitting: Internal Medicine

## 2015-05-23 VITALS — BP 126/82 | HR 51 | Temp 97.8°F | Ht 63.0 in | Wt 211.5 lb

## 2015-05-23 DIAGNOSIS — I83019 Varicose veins of right lower extremity with ulcer of unspecified site: Secondary | ICD-10-CM | POA: Diagnosis not present

## 2015-05-23 DIAGNOSIS — E785 Hyperlipidemia, unspecified: Secondary | ICD-10-CM | POA: Diagnosis not present

## 2015-05-23 DIAGNOSIS — Z0000 Encounter for general adult medical examination without abnormal findings: Secondary | ICD-10-CM

## 2015-05-23 DIAGNOSIS — L97919 Non-pressure chronic ulcer of unspecified part of right lower leg with unspecified severity: Secondary | ICD-10-CM

## 2015-05-23 DIAGNOSIS — Z1159 Encounter for screening for other viral diseases: Secondary | ICD-10-CM

## 2015-05-23 DIAGNOSIS — Z09 Encounter for follow-up examination after completed treatment for conditions other than malignant neoplasm: Secondary | ICD-10-CM

## 2015-05-23 DIAGNOSIS — Z23 Encounter for immunization: Secondary | ICD-10-CM

## 2015-05-23 NOTE — Patient Instructions (Signed)
Get your blood work before you leave      Next visit  for a routine checkup in 4-5 months     (30  minutes) Please schedule an appointment at the front desk Please come back fasting

## 2015-05-23 NOTE — Progress Notes (Signed)
Subjective:    Patient ID: Shaun Yoder, male    DOB: 1946/02/15, 69 y.o.   MRN: [id number]  DOS:  05/23/2015 Type of visit - description : Follow-up Interval history:  Lower extremity ulcers, now under the care of the [facility]., has  weekly visit 4. Improving. Medication list reviewed, most days is prescribed by other practitioners.    Review of Systems Denies fever chills History of lower extremity edema, improving after he started using compression stockings. No nausea, vomiting, diarrhea.   Past Medical History  Diagnosis Date  . Cancer ([place])     bladder  . Varicose vein   . OA (osteoarthritis)   . Hyperlipidemia   . GERD (gastroesophageal reflux disease)   . Schizoaffective disorder, bipolar type ([place])   . Anemia   . Iron deficiency   . Hypertension   . Low back pain   . Colitis   . Gastritis   . Depression   . Sleep apnea   . Colon polyps   . Diverticulosis   . History of rectal polyps     Past Surgical History  Procedure Laterality Date  . Total knee arthroplasty Bilateral   . Bladder surgery    . Varicose vein surgery      Social History   Social History  . Marital Status: Married    Spouse Name: N/A  . Number of Children: 2  . Years of Education: N/A   Occupational History  . retired    Social History Main Topics  . Smoking status: Former Smoker -- 2.50 packs/day for 43 years    Types: Cigarettes    Quit date: 07/22/1998  . Smokeless tobacco: Not on file  . Alcohol Use: No  . Drug Use: No  . Sexual Activity: Not on file   Other Topics Concern  . Not on file   Social History Narrative   Originally from [place]. Moved to [facility] in July 22, 1985. 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 No known asbestos exposure. 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 No international  travel. Has a dog currently. Remote exposure to Cockatiel in a different home. No mold exposure. No hot tub exposure.       Two adopted children        Medication List       This list is accurate as of: 05/23/15 11:59 PM.  Always use your most recent med list.               Abatacept 125 MG/ML Sosy  Inject into the skin once a week.     ARIPiprazole 30 MG tablet  Commonly known as:  ABILIFY  Take 30 mg by mouth at bedtime.     aspirin 81 MG tablet  Take 81 mg by mouth daily.     calcium carbonate 600 MG Tabs tablet  Commonly known as:  OS-CAL  Take 1,200 mg by mouth daily.     collagenase ointment  Commonly known as:  SANTYL  Apply 1 application topically daily.     divalproex 500 MG 24 hr tablet  Commonly known as:  DEPAKOTE ER  4 tabs at bedtime     Fish Oil Oil  Take 1 capsule by mouth daily.     folic acid 1 MG tablet  Commonly known as:  [employer]  Take 1 mg by mouth daily.     gabapentin 300 MG capsule  Commonly known as:  NEURONTIN  Take 300 mg by mouth 2 (two) times daily.     meloxicam 15 MG tablet  Commonly known as:  MOBIC  Take 15 mg by mouth daily.     methotrexate 2.5 MG tablet  Commonly known as:  RHEUMATREX  Take 25 mg by mouth once a week. Caution:Chemotherapy. Protect from light.     metoprolol succinate 25 MG 24 hr tablet  Commonly known as:  TOPROL-XL  Take 12.5 mg by mouth daily.     mirabegron ER 50 MG Tb24 tablet  Commonly known as:  MYRBETRIQ  Take 50 mg by mouth daily.     multivitamins ther. w/minerals Tabs tablet  Take 1 tablet by mouth daily.     omeprazole 20 MG capsule  Commonly known as:  PRILOSEC  Take 40 mg by mouth every morning.     ranitidine 150 MG tablet  Commonly known as:  ZANTAC  Take 1 tablet (150 mg total) by mouth at bedtime.     ziprasidone 60 MG capsule  Commonly known as:  GEODON  1 tablet every AM and PM and 2 at bedtime           Objective:   Physical Exam BP 126/82 mmHg  Pulse 51   Temp(Src) 97.8 F (36.6 C) (Oral)  Ht 5\' 3"  (1.6 m)  Wt 211 lb 8 oz (95.936 kg)  BMI 37.48 kg/m2  SpO2 99% General:   Well developed, well nourished . NAD .  HEENT:  Normocephalic . Face symmetric, atraumatic Lungs:  CTA B Normal respiratory effort, no intercostal retractions, no accessory muscle use. Heart: RRR, significant systolic murmur.  Extremities: They are covered, right one with a wrap placed  by the [place] [place]. I did not uncovered them (goes to the [facility] weekly)   Neurologic:  alert & oriented X3.  Speech normal, gait limited by DJD. Psych--  Cognition and judgment appear intact.  Cooperative with normal attention span and concentration.  Behavior appropriate. No anxious or depressed appearing.      Assessment & Plan:   Problem list > Hypertension Hyperlipidemia Ao stenosis, severe: Dr Burt Knack [facility]: --Seronegative Rheumatoid arthritis --DJD --low back pain d/t DJD --Sees Dr. [name], Dr Tamera Punt (ortho) Schizophrenia, bipolar, depression: Follow-up at the [facility] in [facility] Bladder cancer-transitional cell, Dr. Shona Needles History of iron deficiency anemia GI:   GERD, history of colitis, history of gastritis, colon polyps, diverticulosis Pulmomany: Dr Ashok Cordia --Sleep apnea-- Cpap intolerant --CT nodules f/u 2 pulmonary Varicose veins: Status post remote surgery   Assessment and plan Hyperlipidemia: Recommend to come back fasting, will check labs. Varicose veins, lower extremity ulcers: Under the care off the [facility], reportedly improving, I did not unwrap his legs. Primary care: Request a hepatitis C screening. Will do. He gets some primary care at the [place] but these unclear what they do. Does not recall having recent immunizations. Will provide flu shot and pneumonia shot. Also, he gets labs at rheumatology and the [place]. RTC 4-5 months, fasting for a checkup.

## 2015-05-23 NOTE — Progress Notes (Signed)
Pre visit review using our clinic review tool, if applicable. No additional management support is needed unless otherwise documented below in the visit note. 

## 2015-05-24 LAB — HEPATITIS C ANTIBODY: HCV AB: NEGATIVE

## 2015-05-24 NOTE — Assessment & Plan Note (Signed)
Hyperlipidemia: Recommend to come back fasting, will check labs. Varicose veins, lower extremity ulcers: Under the care off the [facility], reportedly improving, I did not unwrap his legs. Primary care: Request a hepatitis C screening. Will do. He gets some primary care at the [place] but these unclear what they do. Does not recall having recent immunizations. Will provide flu shot and pneumonia shot. Also, he gets labs at rheumatology and the [place]. RTC 4-5 months, fasting for a checkup.

## 2015-05-25 DIAGNOSIS — M150 Primary generalized (osteo)arthritis: Secondary | ICD-10-CM | POA: Diagnosis not present

## 2015-05-25 DIAGNOSIS — M0589 Other rheumatoid arthritis with rheumatoid factor of multiple sites: Secondary | ICD-10-CM | POA: Diagnosis not present

## 2015-05-25 DIAGNOSIS — Z79899 Other long term (current) drug therapy: Secondary | ICD-10-CM | POA: Diagnosis not present

## 2015-05-25 DIAGNOSIS — M2550 Pain in unspecified joint: Secondary | ICD-10-CM | POA: Diagnosis not present

## 2015-05-25 DIAGNOSIS — I878 Other specified disorders of veins: Secondary | ICD-10-CM | POA: Diagnosis not present

## 2015-05-25 DIAGNOSIS — R609 Edema, unspecified: Secondary | ICD-10-CM | POA: Diagnosis not present

## 2015-05-25 DIAGNOSIS — I83009 Varicose veins of unspecified lower extremity with ulcer of unspecified site: Secondary | ICD-10-CM | POA: Diagnosis not present

## 2015-05-25 DIAGNOSIS — D696 Thrombocytopenia, unspecified: Secondary | ICD-10-CM | POA: Diagnosis not present

## 2015-05-29 ENCOUNTER — Encounter (HOSPITAL_BASED_OUTPATIENT_CLINIC_OR_DEPARTMENT_OTHER): Payer: Medicare Other | Attending: Plastic Surgery

## 2015-05-29 DIAGNOSIS — I83013 Varicose veins of right lower extremity with ulcer of ankle: Secondary | ICD-10-CM | POA: Insufficient documentation

## 2015-05-29 DIAGNOSIS — M069 Rheumatoid arthritis, unspecified: Secondary | ICD-10-CM | POA: Diagnosis not present

## 2015-05-29 DIAGNOSIS — I872 Venous insufficiency (chronic) (peripheral): Secondary | ICD-10-CM | POA: Insufficient documentation

## 2015-05-29 DIAGNOSIS — L97311 Non-pressure chronic ulcer of right ankle limited to breakdown of skin: Secondary | ICD-10-CM | POA: Insufficient documentation

## 2015-06-05 ENCOUNTER — Encounter: Payer: Self-pay | Admitting: Vascular Surgery

## 2015-06-05 DIAGNOSIS — I872 Venous insufficiency (chronic) (peripheral): Secondary | ICD-10-CM | POA: Diagnosis not present

## 2015-06-05 DIAGNOSIS — I83013 Varicose veins of right lower extremity with ulcer of ankle: Secondary | ICD-10-CM | POA: Diagnosis not present

## 2015-06-05 DIAGNOSIS — M069 Rheumatoid arthritis, unspecified: Secondary | ICD-10-CM | POA: Diagnosis not present

## 2015-06-05 DIAGNOSIS — L97311 Non-pressure chronic ulcer of right ankle limited to breakdown of skin: Secondary | ICD-10-CM | POA: Diagnosis not present

## 2015-06-07 ENCOUNTER — Encounter: Payer: Self-pay | Admitting: Vascular Surgery

## 2015-06-07 ENCOUNTER — Ambulatory Visit (INDEPENDENT_AMBULATORY_CARE_PROVIDER_SITE_OTHER): Payer: Medicare Other | Admitting: Vascular Surgery

## 2015-06-07 VITALS — BP 127/66 | HR 45 | Ht 63.0 in | Wt 213.0 lb

## 2015-06-07 DIAGNOSIS — I872 Venous insufficiency (chronic) (peripheral): Secondary | ICD-10-CM

## 2015-06-07 NOTE — Progress Notes (Signed)
[facility]  Patient name: Shaun Yoder MRN: [id number] DOB: [birth year] Sex: male  REASON FOR CONSULT: Chronic venous insufficiency. Referred from the wound care center.  HPI: Shaun Yoder is a 69 y.o. male, who is referred from the wound care center with venous ulcers of the right lower extremity. Previous study showed evidence of infrainguinal arterial occlusive disease on the right and also deep and superficial vein reflux in the right lower extremity. He states that he has had the wounds on his right leg for 5 weeks and these are being treated with compression dressings. He is unaware of any previous history of DVT. He does describe aching pain associated with standing and relieved somewhat with elevation.  He reports a history of aortic stenosis and was being considered for heart cath and possible intervention however he decided he did not want to do this. His cardiologist is Dr. Sherren Mocha.  Past Medical History  Diagnosis Date  . Cancer ([place])     bladder  . Varicose vein   . OA (osteoarthritis)   . Hyperlipidemia   . GERD (gastroesophageal reflux disease)   . Schizoaffective disorder, bipolar type ([place])   . Anemia   . Iron deficiency   . Hypertension   . Low back pain   . Colitis   . Gastritis   . Depression   . Sleep apnea   . Colon polyps   . Diverticulosis   . History of rectal polyps   He notes a history of aortic stenosis.  Family History  Problem Relation Age of Onset  . Heart attack Mother   . Heart attack Father   . Stroke Father   . Rheumatologic disease Neg Hx   . Colon cancer Neg Hx   . Lung disease Neg Hx   . Prostate cancer Neg Hx     SOCIAL HISTORY: Social History   Social History  . Marital Status: Married    Spouse Name: N/A  . Number of Children: 2  . Years of Education: N/A   Occupational History  . retired    Social History Main Topics  . Smoking status: Former Smoker -- 2.50 packs/day  for 43 years    Types: Cigarettes    Quit date: 07/22/1998  . Smokeless tobacco: Not on file  . Alcohol Use: No  . Drug Use: No  . Sexual Activity: Not on file   Other Topics Concern  . Not on file   Social History Narrative   Originally from [place]. Moved to [facility] in July 22, 1985. 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 No known asbestos exposure. 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 No international travel. Has a dog currently. Remote exposure to Cockatiel in a different home. No mold exposure. No hot tub exposure.       Two adopted children    Allergies  Allergen Reactions  . Leflunomide Other (See Comments)    diarrhea  . Morphine And Related Nausea And Vomiting    Current Outpatient Prescriptions  Medication Sig Dispense Refill  . ARIPiprazole (ABILIFY) 30 MG tablet Take 30 mg by mouth at bedtime.      [name] aspirin 81 MG tablet Take 81 mg by mouth daily.      . calcium carbonate (OS-CAL) 600 MG TABS Take 1,200 mg by  mouth daily.      . divalproex (DEPAKOTE ER) 500 MG 24 hr tablet 4 tabs at bedtime    . Fish Oil OIL Take 1 capsule by mouth daily.     . folic acid (FOLVITE) 1 MG tablet Take 1 mg by mouth daily.      [name] gabapentin (NEURONTIN) 300 MG capsule Take 300 mg by mouth 2 (two) times daily.     . meloxicam (MOBIC) 15 MG tablet Take 15 mg by mouth daily.    . methotrexate (RHEUMATREX) 2.5 MG tablet Take 25 mg by mouth once a week. Caution:Chemotherapy. Protect from light.     . metoprolol succinate (TOPROL-XL) 25 MG 24 hr tablet Take 12.5 mg by mouth daily.    . mirabegron ER (MYRBETRIQ) 50 MG TB24 tablet Take 50 mg by mouth daily.    . Multiple Vitamins-Minerals (MULTIVITAMINS THER. W/MINERALS) TABS Take 1 tablet by mouth daily.      [name] omeprazole (PRILOSEC) 20 MG capsule Take 40 mg by mouth every morning.      .  ranitidine (ZANTAC) 150 MG tablet Take 1 tablet (150 mg total) by mouth at bedtime. 90 tablet 1  . ziprasidone (GEODON) 60 MG capsule 1 tablet every AM and PM and 2 at bedtime    . Abatacept 125 MG/ML SOSY Inject into the skin once a week.    . collagenase (SANTYL) ointment Apply 1 application topically daily. (Patient not taking: Reported on 05/23/2015) 30 g 0   No current facility-administered medications for this visit.    REVIEW OF SYSTEMS:  [X]  denotes positive finding, [ ]  denotes negative finding Cardiac  Comments:  Chest pain or chest pressure:    Shortness of breath upon exertion: X   Short of breath when lying flat:    Irregular heart rhythm:        Vascular    Pain in calf, thigh, or hip brought on by ambulation:    Pain in feet at night that wakes you up from your sleep:     Blood clot in your veins:    Leg swelling:  X       Pulmonary    Oxygen at home:    Productive cough:     Wheezing:         Neurologic    Sudden weakness in arms or legs:     Sudden numbness in arms or legs:   History of neuropathy.  Sudden onset of difficulty speaking or slurred speech:    Temporary loss of vision in one eye:     Problems with dizziness:         Gastrointestinal    Blood in stool:     Vomited blood:         Genitourinary    Burning when urinating:     Blood in urine:        Psychiatric    Major depression:         Hematologic    Bleeding problems:    Problems with blood clotting too easily:        Skin    Rashes or ulcers:        Constitutional    Fever or chills:      PHYSICAL EXAM: Filed Vitals:   06/07/15 1356  BP: 127/66  Pulse: 45  Height: 5\' 3"  (1.6 m)  Weight: 213 lb (96.616 kg)  SpO2: 95%    GENERAL: The patient is a well-nourished male, in no acute  distress. The vital signs are documented above. CARDIAC: There is a regular rate and rhythm. He has a significant systolic ejection murmur. VASCULAR: The patient has a right carotid bruit versus  transmitted murmur from his aortic stenosis. He has palpable femoral pulses. I cannot palpate popliteal or pedal pulses. By my exam he has monophasic Doppler signals in the dorsalis pedis and posterior tibial positions bilaterally. PULMONARY: There is good air exchange bilaterally without wheezing or rales. ABDOMEN: Soft and non-tender with normal pitched bowel sounds.  MUSCULOSKELETAL: There are no major deformities or cyanosis. NEUROLOGIC: No focal weakness or paresthesias are detected. SKIN: He has hyperpigmentation bilaterally consistent with chronic venous insufficiency. He has an open ulcer on the distal lateral aspect of his right leg which is 2 cm in diameter. He has a larger blister which is ruptured on the anterior lateral aspect of his right leg. PSYCHIATRIC: The patient has a normal affect.  DATA:   LOWER EXTREMITY VENOUS DUPLEX: I have reviewed the lower extremity venous duplex scan which was done on 04/28/2015.  On the right side, there is no evidence of DVT. There is clot in the right great saphenous vein. This is noted in the calf and appears to be chronic nonocclusive clot. There is also noted to be deep vein reflux in the right common femoral vein and popliteal vein. In addition, there is reflux in the right great saphenous vein including the saphenofemoral junction. Diameters in the great saphenous vein range from 0.63-0.70 cm.  ARTERIAL DOPPLER STUDY: I reviewed the arterial Doppler study dated 04/28/2015. ABIs could not be obtained as the patient had noncompressible vessels. The right great toe pressure was 74 mmHg. The left great toe pressure was normal.  MEDICAL ISSUES:  COMBINED CHRONIC VENOUS INSUFFICIENCY AND INFRAINGUINAL ARTERIAL OCCLUSIVE DISEASE: Given that the treatment for his venous ulcer would be elevation and compression, I think it would be helpful to know the severity of his infrainguinal arterial occlusive disease. For this reason I would recommend obtaining  an arteriogram. However he does have significant aortic stenosis and I have explained that I would want to have Dr. Burt Knack see him to evaluate his risk from a cardiac standpoint before proceeding with an aggressive approach for his venous ulcers. Currently the patient does not wish to proceed with an arteriogram. I have explained that the standard treatment for his venous ulcer is elevation and compression, although given his infrainguinal arterial occlusive disease he may not be able to elevate his leg too high because he could develop rest pain. In addition his compression dressing should not be too tight because of his arterial disease. In addition, he could ultimately be considered for laser ablation of the right great saphenous vein as this could be contributing to his venous ulceration. If he changes his mind about proceeding with the arteriogram certainly we can arrange this. He does appear to have normal renal function based on his most recent labs.   Deitra Mayo [facility] [place]: [phone]

## 2015-06-12 DIAGNOSIS — M069 Rheumatoid arthritis, unspecified: Secondary | ICD-10-CM | POA: Diagnosis not present

## 2015-06-12 DIAGNOSIS — I872 Venous insufficiency (chronic) (peripheral): Secondary | ICD-10-CM | POA: Diagnosis not present

## 2015-06-12 DIAGNOSIS — I83013 Varicose veins of right lower extremity with ulcer of ankle: Secondary | ICD-10-CM | POA: Diagnosis not present

## 2015-06-12 DIAGNOSIS — L97311 Non-pressure chronic ulcer of right ankle limited to breakdown of skin: Secondary | ICD-10-CM | POA: Diagnosis not present

## 2015-06-20 DIAGNOSIS — I83013 Varicose veins of right lower extremity with ulcer of ankle: Secondary | ICD-10-CM | POA: Diagnosis not present

## 2015-06-20 DIAGNOSIS — I872 Venous insufficiency (chronic) (peripheral): Secondary | ICD-10-CM | POA: Diagnosis not present

## 2015-06-20 DIAGNOSIS — L97311 Non-pressure chronic ulcer of right ankle limited to breakdown of skin: Secondary | ICD-10-CM | POA: Diagnosis not present

## 2015-06-20 DIAGNOSIS — M069 Rheumatoid arthritis, unspecified: Secondary | ICD-10-CM | POA: Diagnosis not present

## 2015-06-26 ENCOUNTER — Encounter (HOSPITAL_BASED_OUTPATIENT_CLINIC_OR_DEPARTMENT_OTHER): Payer: Medicare Other | Attending: Plastic Surgery

## 2015-06-26 DIAGNOSIS — G4730 Sleep apnea, unspecified: Secondary | ICD-10-CM | POA: Diagnosis not present

## 2015-06-26 DIAGNOSIS — M069 Rheumatoid arthritis, unspecified: Secondary | ICD-10-CM | POA: Insufficient documentation

## 2015-06-26 DIAGNOSIS — I872 Venous insufficiency (chronic) (peripheral): Secondary | ICD-10-CM | POA: Diagnosis not present

## 2015-06-26 DIAGNOSIS — L97311 Non-pressure chronic ulcer of right ankle limited to breakdown of skin: Secondary | ICD-10-CM | POA: Diagnosis present

## 2015-06-26 DIAGNOSIS — G629 Polyneuropathy, unspecified: Secondary | ICD-10-CM | POA: Diagnosis not present

## 2015-06-26 DIAGNOSIS — I83013 Varicose veins of right lower extremity with ulcer of ankle: Secondary | ICD-10-CM | POA: Insufficient documentation

## 2015-06-26 DIAGNOSIS — L97811 Non-pressure chronic ulcer of other part of right lower leg limited to breakdown of skin: Secondary | ICD-10-CM | POA: Insufficient documentation

## 2015-06-26 DIAGNOSIS — I10 Essential (primary) hypertension: Secondary | ICD-10-CM | POA: Insufficient documentation

## 2015-07-03 DIAGNOSIS — M069 Rheumatoid arthritis, unspecified: Secondary | ICD-10-CM | POA: Diagnosis not present

## 2015-07-03 DIAGNOSIS — I10 Essential (primary) hypertension: Secondary | ICD-10-CM | POA: Diagnosis not present

## 2015-07-03 DIAGNOSIS — G4730 Sleep apnea, unspecified: Secondary | ICD-10-CM | POA: Diagnosis not present

## 2015-07-03 DIAGNOSIS — I872 Venous insufficiency (chronic) (peripheral): Secondary | ICD-10-CM | POA: Diagnosis not present

## 2015-07-03 DIAGNOSIS — L97811 Non-pressure chronic ulcer of other part of right lower leg limited to breakdown of skin: Secondary | ICD-10-CM | POA: Diagnosis not present

## 2015-07-03 DIAGNOSIS — L97311 Non-pressure chronic ulcer of right ankle limited to breakdown of skin: Secondary | ICD-10-CM | POA: Diagnosis not present

## 2015-07-05 ENCOUNTER — Ambulatory Visit: Payer: Medicare Other | Admitting: Pulmonary Disease

## 2015-07-10 DIAGNOSIS — G4730 Sleep apnea, unspecified: Secondary | ICD-10-CM | POA: Diagnosis not present

## 2015-07-10 DIAGNOSIS — L97811 Non-pressure chronic ulcer of other part of right lower leg limited to breakdown of skin: Secondary | ICD-10-CM | POA: Diagnosis not present

## 2015-07-10 DIAGNOSIS — L97311 Non-pressure chronic ulcer of right ankle limited to breakdown of skin: Secondary | ICD-10-CM | POA: Diagnosis not present

## 2015-07-10 DIAGNOSIS — I10 Essential (primary) hypertension: Secondary | ICD-10-CM | POA: Diagnosis not present

## 2015-07-10 DIAGNOSIS — I872 Venous insufficiency (chronic) (peripheral): Secondary | ICD-10-CM | POA: Diagnosis not present

## 2015-07-10 DIAGNOSIS — M069 Rheumatoid arthritis, unspecified: Secondary | ICD-10-CM | POA: Diagnosis not present

## 2015-07-26 ENCOUNTER — Encounter (HOSPITAL_BASED_OUTPATIENT_CLINIC_OR_DEPARTMENT_OTHER): Payer: Medicare Other | Attending: Surgery

## 2015-07-26 DIAGNOSIS — G629 Polyneuropathy, unspecified: Secondary | ICD-10-CM | POA: Insufficient documentation

## 2015-07-26 DIAGNOSIS — I872 Venous insufficiency (chronic) (peripheral): Secondary | ICD-10-CM | POA: Insufficient documentation

## 2015-07-26 DIAGNOSIS — M069 Rheumatoid arthritis, unspecified: Secondary | ICD-10-CM | POA: Insufficient documentation

## 2015-07-26 DIAGNOSIS — I83013 Varicose veins of right lower extremity with ulcer of ankle: Secondary | ICD-10-CM | POA: Insufficient documentation

## 2015-07-26 DIAGNOSIS — G4730 Sleep apnea, unspecified: Secondary | ICD-10-CM | POA: Insufficient documentation

## 2015-07-26 DIAGNOSIS — L97311 Non-pressure chronic ulcer of right ankle limited to breakdown of skin: Secondary | ICD-10-CM | POA: Insufficient documentation

## 2015-07-26 DIAGNOSIS — I10 Essential (primary) hypertension: Secondary | ICD-10-CM | POA: Insufficient documentation

## 2015-08-01 ENCOUNTER — Other Ambulatory Visit: Payer: Medicare Other

## 2015-08-01 DIAGNOSIS — R05 Cough: Secondary | ICD-10-CM

## 2015-08-01 DIAGNOSIS — I10 Essential (primary) hypertension: Secondary | ICD-10-CM | POA: Diagnosis not present

## 2015-08-01 DIAGNOSIS — G4730 Sleep apnea, unspecified: Secondary | ICD-10-CM | POA: Diagnosis not present

## 2015-08-01 DIAGNOSIS — M069 Rheumatoid arthritis, unspecified: Secondary | ICD-10-CM | POA: Diagnosis not present

## 2015-08-01 DIAGNOSIS — R059 Cough, unspecified: Secondary | ICD-10-CM

## 2015-08-01 DIAGNOSIS — L97311 Non-pressure chronic ulcer of right ankle limited to breakdown of skin: Secondary | ICD-10-CM | POA: Diagnosis not present

## 2015-08-01 DIAGNOSIS — G629 Polyneuropathy, unspecified: Secondary | ICD-10-CM | POA: Diagnosis not present

## 2015-08-01 DIAGNOSIS — I872 Venous insufficiency (chronic) (peripheral): Secondary | ICD-10-CM | POA: Diagnosis not present

## 2015-08-01 DIAGNOSIS — I83013 Varicose veins of right lower extremity with ulcer of ankle: Secondary | ICD-10-CM | POA: Diagnosis not present

## 2015-08-04 ENCOUNTER — Ambulatory Visit (INDEPENDENT_AMBULATORY_CARE_PROVIDER_SITE_OTHER): Payer: Medicare Other | Admitting: Pulmonary Disease

## 2015-08-04 ENCOUNTER — Encounter: Payer: Self-pay | Admitting: Pulmonary Disease

## 2015-08-04 VITALS — BP 142/92 | HR 51 | Temp 97.7°F | Ht 61.0 in | Wt 208.0 lb

## 2015-08-04 DIAGNOSIS — K219 Gastro-esophageal reflux disease without esophagitis: Secondary | ICD-10-CM | POA: Diagnosis not present

## 2015-08-04 DIAGNOSIS — R05 Cough: Secondary | ICD-10-CM | POA: Diagnosis not present

## 2015-08-04 DIAGNOSIS — R059 Cough, unspecified: Secondary | ICD-10-CM

## 2015-08-04 DIAGNOSIS — G4733 Obstructive sleep apnea (adult) (pediatric): Secondary | ICD-10-CM | POA: Diagnosis not present

## 2015-08-04 DIAGNOSIS — R918 Other nonspecific abnormal finding of lung field: Secondary | ICD-10-CM

## 2015-08-04 LAB — PULMONARY FUNCTION TEST
DL/VA % PRED: 103 %
DL/VA: 4.03 ml/min/mmHg/L
DLCO UNC % PRED: 71 %
DLCO unc: 14.36 ml/min/mmHg
FEF 25-75 POST: 0.67 L/s
FEF 25-75 PRE: 1.44 L/s
FEF2575-%Change-Post: -53 %
FEF2575-%PRED-POST: 38 %
FEF2575-%Pred-Pre: 83 %
FEV1-%Change-Post: -12 %
FEV1-%PRED-PRE: 70 %
FEV1-%Pred-Post: 62 %
FEV1-POST: 1.38 L
FEV1-Pre: 1.57 L
FEV1FVC-%Change-Post: 7 %
FEV1FVC-%PRED-PRE: 106 %
FEV6-%Change-Post: -17 %
FEV6-%PRED-POST: 57 %
FEV6-%PRED-PRE: 70 %
FEV6-POST: 1.63 L
FEV6-Pre: 1.99 L
FEV6FVC-%PRED-POST: 107 %
FEV6FVC-%Pred-Pre: 107 %
FVC-%CHANGE-POST: -17 %
FVC-%PRED-POST: 53 %
FVC-%Pred-Pre: 65 %
FVC-POST: 1.63 L
FVC-Pre: 1.99 L
POST FEV6/FVC RATIO: 100 %
PRE FEV1/FVC RATIO: 79 %
Post FEV1/FVC ratio: 84 %
Pre FEV6/FVC Ratio: 100 %

## 2015-08-04 LAB — RESPIRATORY CULTURE OR RESPIRATORY AND SPUTUM CULTURE
Culture: NORMAL
Organism ID, Bacteria: NORMAL

## 2015-08-04 MED ORDER — RANITIDINE HCL 150 MG PO TABS: 150.0000 mg | ORAL_TABLET | Freq: Every day | ORAL | Status: DC

## 2015-08-04 NOTE — Progress Notes (Signed)
PFT done today. 

## 2015-08-04 NOTE — Progress Notes (Signed)
Subjective:    Patient ID: Shaun Yoder, male    DOB: 11-17-1945, 70 y.o.   MRN: [id number]  [facility].:  Follow-up for Cough, Lung Nodules. OSA, GERD, & Rheumatoid Arthritis.  HPI  Cough: Patient able to produce another sputum specimen on 1/10. Culture negative to date. Reports continued & unchanged cough minimally productive. He reports he coughs with certain types of food.  No nocturnal awakening with coughing.   Lung Nodules:  Likely secondary to underlying rheumatoid arthritis. Cultures have remained negative to date. No adenopathy in his neck, groin, or axilla. Nodules not obvious on last CXR in October 2016. Seen on CT scan August 2016 initially.  OSA:  Not currently on CPAP therapy. Diagnosed remotely. Unable to use CPAP due to claustrophobia.  GERD:  Barium swallow limited by mobility. Previously prescribed Zantac in addition to Prilosec.  He denies any significant reflux or dyspepsia. He has noticed more eructation lately. Reports compliance with his medication. No morning brash water taste.   Rheumatoid Arthritis:  Follows with Dr. Trudie Reed of rheumatology. Methotrexate & medication currently on hold.  Reports he is having more joint pain from his RA. He just started taking Orencia today.   Review of Systems No fever, chills or sweats. No chest pain or pressure. No new rashes or bruising.  Allergies  Allergen Reactions  . Leflunomide Other (See Comments)    diarrhea  . Morphine And Related Nausea And Vomiting   Current Outpatient Prescriptions on File Prior to Visit  Medication Sig Dispense Refill  . Abatacept 125 MG/ML SOSY Inject into the skin once a week.    . ARIPiprazole (ABILIFY) 30 MG tablet Take 30 mg by mouth at bedtime.      [name] aspirin 81 MG tablet Take 81 mg by mouth daily.      . calcium carbonate (OS-CAL) 600 MG TABS Take 1,200 mg by mouth daily.      . collagenase (SANTYL) ointment Apply 1 application topically daily. 30 g 0  . divalproex (DEPAKOTE ER) 500 MG  24 hr tablet 4 tabs at bedtime    . Fish Oil OIL Take 1 capsule by mouth daily.     . folic acid (FOLVITE) 1 MG tablet Take 1 mg by mouth daily.      [name] gabapentin (NEURONTIN) 300 MG capsule Take 300 mg by mouth 2 (two) times daily.     . meloxicam (MOBIC) 15 MG tablet Take 15 mg by mouth daily.    . metoprolol succinate (TOPROL-XL) 25 MG 24 hr tablet Take 12.5 mg by mouth daily.    . mirabegron ER (MYRBETRIQ) 50 MG TB24 tablet Take 50 mg by mouth daily.    . Multiple Vitamins-Minerals (MULTIVITAMINS THER. W/MINERALS) TABS Take 1 tablet by mouth daily.      [name] omeprazole (PRILOSEC) 20 MG capsule Take 40 mg by mouth every morning.      . ranitidine (ZANTAC) 150 MG tablet Take 1 tablet (150 mg total) by mouth at bedtime. 90 tablet 1  . ziprasidone (GEODON) 60 MG capsule 1 tablet every AM and PM and 2 at bedtime    . methotrexate (RHEUMATREX) 2.5 MG tablet Take 25 mg by mouth once a week. Reported on 08/04/2015     No current facility-administered medications on file prior to visit.   Past Medical History  Diagnosis Date  . Cancer ([place])     bladder  . Varicose vein   . OA (osteoarthritis)   . Hyperlipidemia   .  GERD (gastroesophageal reflux disease)   . Schizoaffective disorder, bipolar type ([place])   . Anemia   . Iron deficiency   . Hypertension   . Low back pain   . Colitis   . Gastritis   . Depression   . Sleep apnea   . Colon polyps   . Diverticulosis   . History of rectal polyps    Past Surgical History  Procedure Laterality Date  . Total knee arthroplasty Bilateral   . Bladder surgery    . Varicose vein surgery     Family History  Problem Relation Age of Onset  . Heart attack Mother   . Heart attack Father   . Stroke Father   . Rheumatologic disease Neg Hx   . Colon cancer Neg Hx   . Lung disease Neg Hx   . Prostate cancer Neg Hx    Social History   Social History  . Marital Status: Married    Spouse Name: N/A  . Number of Children: 2  . Years of Education: N/A    Occupational History  . retired    Social History Main Topics  . Smoking status: Former Smoker -- 2.50 packs/day for 43 years    Types: Cigarettes    Quit date: 07/22/1998  . Smokeless tobacco: None  . Alcohol Use: No  . Drug Use: No  . Sexual Activity: Not Asked   Other Topics Concern  . None   Social History Narrative   Originally from [place]. Moved to [facility] in July 22, 1985. 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 No known asbestos exposure. 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 No international travel. Has a dog currently. Remote exposure to Cockatiel in a different home. No mold exposure. No hot tub exposure.       Two adopted children      Objective:   Physical Exam BP 142/92 mmHg  Pulse 51  Temp(Src) 97.7 F (36.5 C) (Oral)  Ht 5\' 1"  (1.549 m)  Wt 208 lb (94.348 kg)  BMI 39.32 kg/m2  SpO2 93% General:  Awake. Alert. Obese Caucasian male.  Integument:  Warm & dry. No rash on exposed skin.  HEENT:  Tacky mucus membranes. No oral ulcers. No scleral injection. No nasal turbinate swelling.  Cardiovascular:  Regular rate. Bilateral lower extremity edema unchanged. No appreciable JVD given positioning.  Pulmonary:  Clear bilaterally to auscultation. Symmetric chest wall expansion. Normal work of breathing on room air. Abdomen: Soft. Normal bowel sounds. Protuberant. Grossly nontender. Lymphatics: No appreciated cervical or supraclavicular lymphadenopathy.  PFT 08/04/15: FVC 1.99 L (65%) FEV1 1.57 L (70%) FEV1/FVC 0.79 FEF 25-75 1.44 L (83%) no bronchodilator response DLCO uncorrected 71%  IMAGING  CXR PA/LAT 05/11/15 (personally reviewed by me):  No opacity appreciated. Low lung volumes. I don't appreciate any nodules or opacities. Heart normal in size. No pleural effusion.  BARIUM SWALLOW (03/24/15): No aspiration  or laryngeal penetration. Moderate esophageal dysmotility suggestive of chronic reflux. No hiatal hernia. No reflux elicited. No mass or stricture detected. Reflux evaluation was somewhat limited due to patient's immobility. Barium tablet became lodged in the vallecula.  CT CHEST W/O 03/16/15 (previously reviewed by me): Patchy groundglass nodules within the left upper lobe. Groundglass nodularity also noted within right middle lobe & lingula. There is some consolidation within the opacification of the medial segment  of the left lower lobe. No pleural effusion or thickening. Patent esophagus with dilation to the level of the cervical spine. No pathologic mediastinal adenopathy. No pericardial effusion. Radiology noted an 8 mm nodule within the right middle lobe as well. Compared with prior imaging there is no clear improvement or worsening.  CARDIAC TTE (12/21/14): LV normal in size with moderate concentric LVH. EF 65-70%. No regional wall motion abnormalities. Grade 1 diastolic dysfunction. RV poorly visualized but cavity size appeared normal with normal systolic function. Pulmonary artery systolic pressure 52 mmHg. LA mildly dilated. RA normal in size. Severe aortic stenosis. No mitral stenosis or regurgitation. Pulmonic valve poorly visualized. Mild tricuspid regurgitation.  MICROBIOLOGY SPUTUM (08/01/15):  Oral Flora / AFB Pending / Fungus negative SPUTUM (03/21/15):  Oral Flora / AFB negative / Fungus negative    Assessment & Plan:  Patient is a 70 year old male with a long history of rheumatoid arthritis followed by Dr. Trudie Reed currently off treatment for a healing ulcer. Cough seems to be stable. I reviewed the patient's prior chest x-ray with him today which shows no evidence of developing mass or effusion. I remain highly suspicious that his lung nodules are secondary to his underlying rheumatoid arthritis. I will contact the patient if his sputum culture grows out any atypical mycobacterial or fungal  infection. We again discussed the need for dual agent therapy for his underlying GERD with esophageal dysmotility. Reviewed the patient's spirometry today which suggests restriction without concomitant obstruction with a normal carbon monoxide diffusion capacity. I instructed the patient contact my office if he had any new breathing problems before his next appointment.  1. Cough: Continuing to follow cultures to completion. Possibly secondary to underlying rheumatoid arthritis versus reflux. 2. Bilateral lung nodules: Suspect secondary to underlying rheumatoid arthritis. Repeat CT scan of the chest without contrast July 2017. Checking full pulmonary function testing at next appointment. 3. OSA: Patient previously intolerant of CPAP therapy due to claustrophobia. Defer repeat testing at this time. 4. GERD with esophageal dysmotility: Continuing Prilosec & Zantac. No changes. 5. Health maintenance: Managed by rheumatology. 6. Follow-up: Return to clinic in 6 months or sooner if needed.

## 2015-08-04 NOTE — Patient Instructions (Signed)
1. We will repeat your CT scan in July 20 17th to follow your lung nodules. 2. He will have pulmonary function testing at her next appointment to monitor your lung function. 3. Please call my office if you have any new breathing problems before your next appointment. 4. I will see you back in 6 months or sooner if needed.  TESTS ORDERED: 1. Full Pulmonary Function Testing at follow-up appointment 2. Chest CT w/o contrast in July 2017

## 2015-08-07 DIAGNOSIS — G629 Polyneuropathy, unspecified: Secondary | ICD-10-CM | POA: Diagnosis not present

## 2015-08-07 DIAGNOSIS — M069 Rheumatoid arthritis, unspecified: Secondary | ICD-10-CM | POA: Diagnosis not present

## 2015-08-07 DIAGNOSIS — I10 Essential (primary) hypertension: Secondary | ICD-10-CM | POA: Diagnosis not present

## 2015-08-07 DIAGNOSIS — L97311 Non-pressure chronic ulcer of right ankle limited to breakdown of skin: Secondary | ICD-10-CM | POA: Diagnosis not present

## 2015-08-07 DIAGNOSIS — G4730 Sleep apnea, unspecified: Secondary | ICD-10-CM | POA: Diagnosis not present

## 2015-08-07 DIAGNOSIS — I83013 Varicose veins of right lower extremity with ulcer of ankle: Secondary | ICD-10-CM | POA: Diagnosis not present

## 2015-08-14 DIAGNOSIS — G4730 Sleep apnea, unspecified: Secondary | ICD-10-CM | POA: Diagnosis not present

## 2015-08-14 DIAGNOSIS — I83013 Varicose veins of right lower extremity with ulcer of ankle: Secondary | ICD-10-CM | POA: Diagnosis not present

## 2015-08-14 DIAGNOSIS — L97311 Non-pressure chronic ulcer of right ankle limited to breakdown of skin: Secondary | ICD-10-CM | POA: Diagnosis not present

## 2015-08-14 DIAGNOSIS — I10 Essential (primary) hypertension: Secondary | ICD-10-CM | POA: Diagnosis not present

## 2015-08-14 DIAGNOSIS — M069 Rheumatoid arthritis, unspecified: Secondary | ICD-10-CM | POA: Diagnosis not present

## 2015-08-14 DIAGNOSIS — G629 Polyneuropathy, unspecified: Secondary | ICD-10-CM | POA: Diagnosis not present

## 2015-08-21 DIAGNOSIS — G4730 Sleep apnea, unspecified: Secondary | ICD-10-CM | POA: Diagnosis not present

## 2015-08-21 DIAGNOSIS — G629 Polyneuropathy, unspecified: Secondary | ICD-10-CM | POA: Diagnosis not present

## 2015-08-21 DIAGNOSIS — I10 Essential (primary) hypertension: Secondary | ICD-10-CM | POA: Diagnosis not present

## 2015-08-21 DIAGNOSIS — M069 Rheumatoid arthritis, unspecified: Secondary | ICD-10-CM | POA: Diagnosis not present

## 2015-08-21 DIAGNOSIS — L97311 Non-pressure chronic ulcer of right ankle limited to breakdown of skin: Secondary | ICD-10-CM | POA: Diagnosis not present

## 2015-08-21 DIAGNOSIS — I83013 Varicose veins of right lower extremity with ulcer of ankle: Secondary | ICD-10-CM | POA: Diagnosis not present

## 2015-08-25 ENCOUNTER — Telehealth: Payer: Self-pay | Admitting: Cardiovascular Disease

## 2015-08-25 NOTE — Telephone Encounter (Signed)
I spoke with the pt's wife and the pt would like to discuss TAVR evaluation with Dr Burt Knack.  Dr Burt Knack will see the pt on 08/30/15 at 3:00.

## 2015-08-25 NOTE — Telephone Encounter (Signed)
New problem   Pt's wife need to speak to nurse concerning pt having a procedure done per their conversation at the last visit with Dr Burt Knack. Please call pt's wife

## 2015-08-28 ENCOUNTER — Encounter (HOSPITAL_BASED_OUTPATIENT_CLINIC_OR_DEPARTMENT_OTHER): Payer: Medicare Other | Attending: Internal Medicine

## 2015-08-28 DIAGNOSIS — G4730 Sleep apnea, unspecified: Secondary | ICD-10-CM | POA: Diagnosis not present

## 2015-08-28 DIAGNOSIS — I872 Venous insufficiency (chronic) (peripheral): Secondary | ICD-10-CM | POA: Insufficient documentation

## 2015-08-28 DIAGNOSIS — G629 Polyneuropathy, unspecified: Secondary | ICD-10-CM | POA: Diagnosis not present

## 2015-08-28 DIAGNOSIS — L97311 Non-pressure chronic ulcer of right ankle limited to breakdown of skin: Secondary | ICD-10-CM | POA: Diagnosis not present

## 2015-08-28 DIAGNOSIS — I878 Other specified disorders of veins: Secondary | ICD-10-CM | POA: Diagnosis not present

## 2015-08-28 DIAGNOSIS — I10 Essential (primary) hypertension: Secondary | ICD-10-CM | POA: Insufficient documentation

## 2015-08-28 DIAGNOSIS — M069 Rheumatoid arthritis, unspecified: Secondary | ICD-10-CM | POA: Diagnosis not present

## 2015-08-28 DIAGNOSIS — I83013 Varicose veins of right lower extremity with ulcer of ankle: Secondary | ICD-10-CM | POA: Diagnosis not present

## 2015-08-28 DIAGNOSIS — M0589 Other rheumatoid arthritis with rheumatoid factor of multiple sites: Secondary | ICD-10-CM | POA: Diagnosis not present

## 2015-08-28 DIAGNOSIS — Z79899 Other long term (current) drug therapy: Secondary | ICD-10-CM | POA: Diagnosis not present

## 2015-08-28 DIAGNOSIS — M2550 Pain in unspecified joint: Secondary | ICD-10-CM | POA: Diagnosis not present

## 2015-08-28 DIAGNOSIS — M150 Primary generalized (osteo)arthritis: Secondary | ICD-10-CM | POA: Diagnosis not present

## 2015-08-28 LAB — FUNGUS CULTURE W SMEAR: Smear Result: NONE SEEN

## 2015-08-30 ENCOUNTER — Ambulatory Visit: Payer: Medicare Other | Admitting: Internal Medicine

## 2015-08-30 ENCOUNTER — Ambulatory Visit (INDEPENDENT_AMBULATORY_CARE_PROVIDER_SITE_OTHER): Payer: Medicare Other | Admitting: Cardiovascular Disease

## 2015-08-30 VITALS — BP 124/60 | HR 54 | Ht 61.0 in | Wt 210.0 lb

## 2015-08-30 DIAGNOSIS — I359 Nonrheumatic aortic valve disorder, unspecified: Secondary | ICD-10-CM | POA: Diagnosis not present

## 2015-08-30 DIAGNOSIS — I350 Nonrheumatic aortic (valve) stenosis: Secondary | ICD-10-CM

## 2015-08-30 LAB — BASIC METABOLIC PANEL
BUN: 30 mg/dL — ABNORMAL HIGH (ref 7–25)
CALCIUM: 9.1 mg/dL (ref 8.6–10.3)
CHLORIDE: 105 mmol/L (ref 98–110)
CO2: 25 mmol/L (ref 20–31)
Creat: 0.97 mg/dL (ref 0.70–1.25)
GLUCOSE: 90 mg/dL (ref 65–99)
POTASSIUM: 4.3 mmol/L (ref 3.5–5.3)
SODIUM: 141 mmol/L (ref 135–146)

## 2015-08-30 LAB — CBC
HEMATOCRIT: 44.4 % (ref 39.0–52.0)
HEMOGLOBIN: 14.8 g/dL (ref 13.0–17.0)
MCH: 31.4 pg (ref 26.0–34.0)
MCHC: 33.3 g/dL (ref 30.0–36.0)
MCV: 94.1 fL (ref 78.0–100.0)
MPV: 9.2 fL (ref 8.6–12.4)
Platelets: 116 10*3/uL — ABNORMAL LOW (ref 150–400)
RBC: 4.72 MIL/uL (ref 4.22–5.81)
RDW: 13.7 % (ref 11.5–15.5)
WBC: 6.1 10*3/uL (ref 4.0–10.5)

## 2015-08-30 NOTE — Patient Instructions (Signed)
Medication Instructions:  Your physician recommends that you continue on your current medications as directed. Please refer to the Current Medication list given to you today.  Labwork: Your physician recommends that you have lab work today: BMP, CBC and PT/INR  Testing/Procedures: Your physician has requested that you have a cardiac catheterization. Cardiac catheterization is used to diagnose and/or treat various heart conditions. Doctors may recommend this procedure for a number of different reasons. The most common reason is to evaluate chest pain. Chest pain can be a symptom of coronary artery disease (CAD), and cardiac catheterization can show whether plaque is narrowing or blocking your heart's arteries. This procedure is also used to evaluate the valves, as well as measure the blood flow and oxygen levels in different parts of your heart. For further information please visit [facility]ugeFiesta[facility][email or web address]. Please follow instruction sheet, as given.  Non-Cardiac CT Angiography (CTA), is a special type of CT scan that uses a computer to produce multi-dimensional views of major blood vessels throughout the body. In CT angiography, a contrast material is injected through an IV to help visualize the blood vessels  Your physician has requested that you have cardiac CT. Cardiac computed tomography (CT) is a painless test that uses an x-ray machine to take clear, detailed pictures of your heart. For further information please visit HugeFiesta[facility][email or web address]. Please follow instruction sheet as given.   Follow-Up: We will arrange further TAVR evaluation after testing.  Levonne Spiller RN will contact you with appointment to meet with the cardiac surgeons.   Any Other Special Instructions Will Be Listed Below (If Applicable).     If you need a refill on your cardiac medications before your next appointment, please call your pharmacy.

## 2015-08-30 NOTE — Progress Notes (Signed)
Cardiology Office Note Date:  09/01/2015   ID:  Shaun Yoder, DOB 1946/05/11, MRN [id number]  PCP:  Kathlene November, MD  Cardiologist:  Tollie Eth, MD  Chief Complaint  Patient presents with  . Shortness of Breath     History of Present Illness: Shaun Yoder is a 70 y.o. male who presents for follow-up evaluation of severe aortic stenosis. He was initially seen in May 2016 when referred by Dr Wynonia Lawman. He underwent echo confirming severe aortic stenosis, but he did not want to go through further testing for TAVR at that time. He has multiple comorbid conditions with longstanding rheumatoid arthritis, peripheral neuropathy, bladder cancer, and schizoaffective disorder. He's been on long-term immunosuppressive therapy for rheumatoid arthritis with methotrexate. This is currently on hold because of nonhealing venous stasis ulcerations.   He reports progressive dyspnea with exertion since I've seen him last. Now with exertional dyspnea even with walking around his house. He ambulates with a walker. He admits to poor balance, spine problems, and severe shoulder arthritis. He denies chest pain, chest pressure, or syncope. He does have occasional lightheadedness. The patient is here with his wife today and he now would like to move forward with evaluation for definitive treatment of his aortic stenosis.   Past Medical History  Diagnosis Date  . Cancer ([place])     bladder  . Varicose vein   . OA (osteoarthritis)   . Hyperlipidemia   . GERD (gastroesophageal reflux disease)   . Schizoaffective disorder, bipolar type ([place])   . Anemia   . Iron deficiency   . Hypertension   . Low back pain   . Colitis   . Gastritis   . Depression   . Sleep apnea   . Colon polyps   . Diverticulosis   . History of rectal polyps     Past Surgical History  Procedure Laterality Date  . Total knee arthroplasty Bilateral   . Bladder surgery    . Varicose vein surgery      Current Outpatient  Prescriptions  Medication Sig Dispense Refill  . ARIPiprazole (ABILIFY) 30 MG tablet Take 30 mg by mouth at bedtime.      [name] aspirin 81 MG tablet Take 81 mg by mouth daily.      . calcium carbonate (OS-CAL) 600 MG TABS Take 1,200 mg by mouth daily.      . collagenase (SANTYL) ointment Apply 1 application topically daily. 30 g 0  . divalproex (DEPAKOTE ER) 500 MG 24 hr tablet 4 tabs at bedtime    . Fish Oil OIL Take 1 capsule by mouth daily.     . folic acid (FOLVITE) 1 MG tablet Take 1 mg by mouth daily.      [name] gabapentin (NEURONTIN) 300 MG capsule Take 300 mg by mouth 2 (two) times daily.     . hydroxychloroquine (PLAQUENIL) 200 MG tablet Take 200 mg by mouth 2 (two) times daily.     . meloxicam (MOBIC) 15 MG tablet Take 15 mg by mouth daily.    . metoprolol succinate (TOPROL-XL) 25 MG 24 hr tablet Take 12.5 mg by mouth daily.    . mirabegron ER (MYRBETRIQ) 50 MG TB24 tablet Take 50 mg by mouth daily.    . Multiple Vitamins-Minerals (MULTIVITAMINS THER. W/MINERALS) TABS Take 1 tablet by mouth daily.      . Multiple Vitamins-Minerals (ZINC PO) Take by mouth daily.    [name] omeprazole (PRILOSEC) 20 MG capsule Take 40 mg by mouth every  morning.      . ranitidine (ZANTAC) 150 MG tablet Take 1 tablet (150 mg total) by mouth at bedtime. 90 tablet 3  . vitamin C (ASCORBIC ACID) 500 MG tablet Take 500 mg by mouth daily.    . ziprasidone (GEODON) 60 MG capsule 1 tablet every AM and PM and 2 at bedtime    . Abatacept 125 MG/ML SOSY Inject into the skin once a week. Reported on 08/30/2015    . methotrexate (RHEUMATREX) 2.5 MG tablet Take 25 mg by mouth once a week. Reported on 08/30/2015     No current facility-administered medications for this visit.    Allergies:   Leflunomide and Morphine and related   Social History:  The patient  reports that he quit smoking about 17 years ago. His smoking use included Cigarettes. He has a 107.5 pack-year smoking history. He does not have any smokeless tobacco history  on file. He reports that he does not drink alcohol or use illicit drugs.   Family History:  The patient's  family history includes Heart attack in his father and mother; Stroke in his father. There is no history of Rheumatologic disease, Colon cancer, Lung disease, or Prostate cancer.    ROS:  Please see the history of present illness.  Otherwise, review of systems is positive for back pain, gait difficulty.  All other systems are reviewed and negative.    PHYSICAL EXAM: VS:  BP 124/60 mmHg  Pulse 54  Ht 5\' 1"  (1.549 m)  Wt 95.255 kg (210 lb)  BMI 39.70 kg/m2 , BMI Body mass index is 39.7 kg/(m^2). GEN: Pleasant male with severe kyphoscoliosis, in no acute distress HEENT: normal Neck: no JVD, no masses. No carotid bruits Cardiac: RRR with 3/6 harsh late peaking systolic murmur at the right upper sternal border with diminished aortic closure             Respiratory:  clear to auscultation bilaterally, normal work of breathing GI: soft, nontender, nondistended, + BS MS: no deformity or atrophy Ext: 1+ bilateral pretibial edema, pedal pulses 2+= bilaterally Skin: Chronic stasis dermatitis Neuro:  Strength and sensation are intact Psych: euthymic mood, full affect  EKG:  EKG is ordered today. The ekg ordered today shows sinus bradycardia 54 bpm, right bundle branch block, LVH with QRS widening  Recent Labs: 08/30/2015: BUN 30*; Creat 0.97; Hemoglobin 14.8; Platelets 116*; Potassium 4.3; Sodium 141   Lipid Panel  No results found for: CHOL, TRIG, HDL, CHOLHDL, VLDL, LDLCALC, LDLDIRECT    Wt Readings from Last 3 Encounters:  08/30/15 95.255 kg (210 lb)  08/04/15 94.348 kg (208 lb)  06/07/15 96.616 kg (213 lb)     Cardiac Studies Reviewed: 2D Echo 12/21/2014: Left ventricle: The cavity size was normal. There was moderate concentric hypertrophy. Systolic function was vigorous. The estimated ejection fraction was in the range of 65% to 70%. Wall motion was normal; there were no  regional wall motion abnormalities. Doppler parameters are consistent with abnormal left ventricular relaxation (grade 1 diastolic dysfunction). Doppler parameters are consistent with elevated mean left atrial filling pressure.  ------------------------------------------------------------------- Aortic valve:  Trileaflet; severely thickened, moderately calcified leaflets. Doppler:  There was severe stenosis.  There was no significant regurgitation.  VTI ratio of LVOT to aortic valve: 0.24. Valve area (VTI): 0.99 cm^2. Indexed valve area (VTI): 0.48 cm^2/m^2. Peak velocity ratio of LVOT to aortic valve: 0.24. Valve area (Vmax): 1.01 cm^2. Indexed valve area (Vmax): 0.49 cm^2/m^2. Mean velocity ratio of LVOT to aortic  valve: 0.22. Valve area (Vmean): 0.91 cm^2. Indexed valve area (Vmean): 0.43 cm^2/m^2.  Mean gradient (S): 44 mm Hg. Peak gradient (S): 80 mm Hg.  ------------------------------------------------------------------- Aorta: Aortic root: The aortic root was normal in size. Ascending aorta: The ascending aorta was normal in size.  ------------------------------------------------------------------- Mitral valve:  Calcified annulus. Leaflet separation was normal. Doppler: Transvalvular velocity was within the normal range. There was no evidence for stenosis. There was no regurgitation.  Peak gradient (D): 3 mm Hg.  ------------------------------------------------------------------- Left atrium: The atrium was mildly dilated.  ------------------------------------------------------------------- Right ventricle: Poorly visualized. The cavity size was normal. Wall thickness was normal. Systolic function was normal.  ------------------------------------------------------------------- Pulmonic valve:  Poorly visualized.  ------------------------------------------------------------------- Tricuspid valve: Poorly visualized. Doppler: There was  mild regurgitation.  ------------------------------------------------------------------- Pulmonary artery:  Systolic pressure was moderately increased.  ------------------------------------------------------------------- Right atrium: The atrium was normal in size.                          STS Risk Calculator Procedure: AV Replacement  Risk of Mortality: 2.029%  Morbidity or Mortality: 14.477%  Long Length of Stay: 5.5%  Short Length of Stay: 49.973%  Permanent Stroke: 0.916%  Prolonged Ventilation: 6.831%  DSW Infection: 0.242%  Renal Failure: 3.628%  Reoperation: 6.409%    ASSESSMENT AND PLAN: 1.  Stage D, Severe symptomatic aortic stenosis: The patient has progressive symptoms since I have last seen him. He now wishes to move forward with definitive therapy which is certainly indicated. I have reviewed the natural history of aortic stenosis with the patient and his wife who is present today. We have discussed the limitations of medical therapy and the poor prognosis associated with symptomatic aortic stenosis. We have reviewed potential treatment options, including palliative medical therapy, conventional surgical aortic valve replacement, and transcatheter aortic valve replacement. We discussed treatment options in the context of this patient's specific comorbid medical conditions. I have reviewed his echo images which show severe aortic valve leaflet calcification and restriction and hemodynamics consistent with severe aortic stenosis.   The patient has advanced rheumatoid arthritis on long-term immunosuppressant therapy. He has marked physical limitation from severe kyphoscoliosis. I think he would have a very difficult time recovering from conventional aortic valve surgery. TAVR may be a reasonable treatment alternative. We discussed next steps with evaluation. This includes cardiac catheterization and CT angiography of the heart and pelvic vasculature. The studies will be arranged.  I have reviewed the risks, indications, and alternatives to cardiac catheterization with the patient. Risks include but are not limited to bleeding, infection, vascular injury, stroke, myocardial infection, arrhythmia, kidney injury, radiation-related injury in the case of prolonged fluoroscopy use, emergency cardiac surgery, and death. The patient understands the risks of serious complication is 1-2 in [id number] with diagnostic cardiac cath and 1-2% or less with angioplasty/stenting.   Anticipate sending him for formal cardiac surgical evaluation after cardiac catheterization for continued multidisciplinary evaluation of his aortic valve disease.   Current medicines are reviewed with the patient today.  The patient does not have concerns regarding medicines.  Labs/ tests ordered today include:   Orders Placed This Encounter  Procedures  . CT Angio Chest W/Cm &/Or Wo Cm  . CT Angio Abd/Pel w/ and/or w/o  . CT Heart Morp W/Cta Cor W/Score W/Ca W/Cm &/Or Wo/Cm  . Basic Metabolic Panel (BMET)  . CBC  . INR/PT  . EKG 12-Lead    Disposition:   As above  Signed, Sherren Mocha, MD  09/01/2015 6:24 PM    [address], [place], [place]  [address] Phone: [phone]; Fax: [phone]

## 2015-09-01 ENCOUNTER — Encounter: Payer: Self-pay | Admitting: Cardiovascular Disease

## 2015-09-01 LAB — PROTIME-INR
INR: 1.13 (ref <1.50)
Prothrombin Time: 14.6 seconds (ref 11.6–15.2)

## 2015-09-04 ENCOUNTER — Telehealth: Payer: Self-pay | Admitting: Cardiovascular Disease

## 2015-09-04 NOTE — Telephone Encounter (Signed)
I spoke with the pt and he has decided that he does not want to proceed with cardiac catheterization or further TAVR evaluation. The pt requested that I cancel his cardiac catheterization.  I spoke with [name] in the cath lab and cancelled procedure.

## 2015-09-04 NOTE — Telephone Encounter (Signed)
New message      Pt has questions about an upcoming procedure.

## 2015-09-06 ENCOUNTER — Ambulatory Visit (INDEPENDENT_AMBULATORY_CARE_PROVIDER_SITE_OTHER): Payer: Medicare Other | Admitting: Internal Medicine

## 2015-09-06 ENCOUNTER — Encounter: Payer: Self-pay | Admitting: Internal Medicine

## 2015-09-06 VITALS — BP 116/74 | HR 52 | Temp 97.8°F | Ht 61.0 in | Wt 213.0 lb

## 2015-09-06 DIAGNOSIS — R05 Cough: Secondary | ICD-10-CM | POA: Diagnosis not present

## 2015-09-06 DIAGNOSIS — Z09 Encounter for follow-up examination after completed treatment for conditions other than malignant neoplasm: Secondary | ICD-10-CM

## 2015-09-06 DIAGNOSIS — I83009 Varicose veins of unspecified lower extremity with ulcer of unspecified site: Secondary | ICD-10-CM | POA: Diagnosis not present

## 2015-09-06 DIAGNOSIS — E785 Hyperlipidemia, unspecified: Secondary | ICD-10-CM

## 2015-09-06 DIAGNOSIS — L97909 Non-pressure chronic ulcer of unspecified part of unspecified lower leg with unspecified severity: Secondary | ICD-10-CM

## 2015-09-06 DIAGNOSIS — I350 Nonrheumatic aortic (valve) stenosis: Secondary | ICD-10-CM | POA: Diagnosis not present

## 2015-09-06 DIAGNOSIS — R059 Cough, unspecified: Secondary | ICD-10-CM

## 2015-09-06 NOTE — Progress Notes (Signed)
Pre visit review using our clinic review tool, if applicable. No additional management support is needed unless otherwise documented below in the visit note. 

## 2015-09-06 NOTE — Progress Notes (Signed)
Subjective:    Patient ID: Shaun Yoder, male    DOB: Aug 03, 1945, 70 y.o.   MRN: [id number]  DOS:  09/06/2015 Type of visit - description : Routine visit Interval history: He updated me on his chronic medical illnesses. Complains of mild cold for the last 10 days with some sputum production    Review of Systems  Denies fever or chills. Some runny nose and sore throat. No chest pain. Dyspnea on exertion baseline. No nausea, vomiting, diarrhea    Past Medical History  Diagnosis Date  . Cancer ([place])     bladder  . Varicose vein   . OA (osteoarthritis)   . Hyperlipidemia   . GERD (gastroesophageal reflux disease)   . Schizoaffective disorder, bipolar type ([place])   . Anemia   . Iron deficiency   . Hypertension   . Low back pain   . Colitis   . Gastritis   . Depression   . Sleep apnea   . Colon polyps   . Diverticulosis   . History of rectal polyps     Past Surgical History  Procedure Laterality Date  . Total knee arthroplasty Bilateral   . Bladder surgery    . Varicose vein surgery      Social History   Social History  . Marital Status: Married    Spouse Name: N/A  . Number of Children: 2  . Years of Education: N/A   Occupational History  . retired    Social History Main Topics  . Smoking status: Former Smoker -- 2.50 packs/day for 43 years    Types: Cigarettes    Quit date: 07/22/1998  . Smokeless tobacco: Not on file  . Alcohol Use: No  . Drug Use: No  . Sexual Activity: Not on file   Other Topics Concern  . Not on file   Social History Narrative   Originally from [place]. Moved to [facility] in July 22, 1985. 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 No known asbestos exposure. 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 No international travel. Has a dog currently. Remote exposure to Cockatiel in a  different home. No mold exposure. No hot tub exposure.       Two adopted children        Medication List       This list is accurate as of: 09/06/15 11:59 PM.  Always use your most recent med list.               Abatacept 125 MG/ML Sosy  Inject into the skin once a week. Reported on 09/06/2015     ARIPiprazole 30 MG tablet  Commonly known as:  ABILIFY  Take 30 mg by mouth at bedtime.     aspirin 81 MG tablet  Take 81 mg by mouth daily.     calcium carbonate 600 MG Tabs tablet  Commonly known as:  OS-CAL  Take 1,200 mg by mouth daily.     collagenase ointment  Commonly known as:  SANTYL  Apply 1 application topically daily.     divalproex 500 MG 24 hr tablet  Commonly known as:  DEPAKOTE ER  4 tabs at bedtime     Fish Oil Oil  Take 1 capsule by mouth daily.     folic acid 1 MG tablet  Commonly known as:  [employer]  Take 1 mg by mouth daily.     gabapentin 300 MG capsule  Commonly known as:  NEURONTIN  Take 300 mg by mouth 2 (two) times daily.     meloxicam 15 MG tablet  Commonly known as:  MOBIC  Take 15 mg by mouth daily.     methotrexate 2.5 MG tablet  Commonly known as:  RHEUMATREX  Take 25 mg by mouth once a week. Reported on 09/06/2015     metoprolol succinate 25 MG 24 hr tablet  Commonly known as:  TOPROL-XL  Take 12.5 mg by mouth daily.     mirabegron ER 50 MG Tb24 tablet  Commonly known as:  MYRBETRIQ  Take 50 mg by mouth daily.     multivitamins ther. w/minerals Tabs tablet  Take 1 tablet by mouth daily.     omeprazole 20 MG capsule  Commonly known as:  PRILOSEC  Take 40 mg by mouth every morning.     ranitidine 150 MG tablet  Commonly known as:  ZANTAC  Take 1 tablet (150 mg total) by mouth at bedtime.     vitamin C 500 MG tablet  Commonly known as:  ASCORBIC ACID  Take 500 mg by mouth daily.     ZINC PO  Take by mouth daily.     ziprasidone 60 MG capsule  Commonly known as:  GEODON  1 tablet every AM and PM and 2 at bedtime             Objective:   Physical Exam BP 116/74 mmHg  Pulse 52  Temp(Src) 97.8 F (36.6 C) (Oral)  Ht 5\' 1"  (1.549 m)  Wt 213 lb (96.616 kg)  BMI 40.27 kg/m2  SpO2 97% General:   Well developed, well nourished . NAD.  HEENT:  Normocephalic . Face symmetric, atraumatic Lungs:  CTA B Normal respiratory effort, no intercostal retractions, no accessory muscle use. Heart: + + syst  murmur.  Skin: Face with use erythema, lips normal. He has also a rash on the upper chest and back. Neurologic:  alert & oriented X3.  Speech normal, gait  assisted by a walker and limited by DJD and kyphosis Psych--  Cognition and judgment appear intact.  Cooperative with normal attention span and concentration.  Behavior appropriate. No anxious or depressed appearing.      Assessment & Plan:   Assessment > Hypertension Hyperlipidemia Ao stenosis, severe: Dr Burt Knack [facility]: --Seronegative Rheumatoid arthritis --DJD --low back pain d/t DJD --Sees Dr. [name], Dr Tamera Punt (ortho) Schizophrenia, bipolar, depression: Follow-up at the [facility] in [facility] Bladder cancer-transitional cell, Dr. Shona Needles GI:   GERD, history of colitis, history of gastritis, colon polyps, diverticulosis Pulmomany: Dr Ashok Cordia --Sleep apnea-- Cpap intolerant --CT nodules f/u 2 pulmonary Varicose veins: s/p remote surgery, has LE wounds  H/o  iron deficiency anemia  Plan Ao stenosis: Was recommended a cardiac catheterization and possibly further intervention, patient declined. I told the patient what I know about the procedure, pros and cons, eventually advised to contact cardiology if he changes his mind. Hyperlipidemia:  Not on  medication, will check FLP when he comes back RA: Recently started Plaquenil, developed a rash yesterday, already contacted his rheumatologist, they stop Plaquenil and he will start Benadryl.  Varicose veins with lower extremity wounds: Has seen at the [facility] 18 times per patient, still not  completely well, has decided to take care of the wounds himself. We talk about possibly referral to [facility], he declined but will let me  know if he changes his mind  Cough: Exam is benign, mild bronchitis? See instructions. RTC 8 months   Today, I spent more than  25  min with the patient: >50% of the time counseling regards  Aortic stenosis,a possible wound care  referral, listening to all his concerns and  giving him my opinions.

## 2015-09-06 NOTE — Patient Instructions (Signed)
For cough:  Take Mucinex DM twice a day as needed until better  If  nasal congestion: Use OTC Nasocort or Flonase : 2 nasal sprays on each side of the nose in the morning until you feel better    Call if not gradually better over the next  10 days  Call anytime if the symptoms are severe   Next visit 6-8 months

## 2015-09-07 DIAGNOSIS — L97909 Non-pressure chronic ulcer of unspecified part of unspecified lower leg with unspecified severity: Secondary | ICD-10-CM

## 2015-09-07 DIAGNOSIS — I83009 Varicose veins of unspecified lower extremity with ulcer of unspecified site: Secondary | ICD-10-CM | POA: Insufficient documentation

## 2015-09-07 NOTE — Assessment & Plan Note (Signed)
Ao stenosis: Was recommended a cardiac catheterization and possibly further intervention, patient declined. I told the patient what I know about the procedure, pros and cons, eventually advised to contact cardiology if he changes his mind. Hyperlipidemia:  Not on  medication, will check FLP when he comes back RA: Recently started Plaquenil, developed a rash yesterday, already contacted his rheumatologist, they stop Plaquenil and he will start Benadryl.  Varicose veins with lower extremity wounds: Has seen at the [facility] 18 times per patient, still not completely well, has decided to take care of the wounds himself. We talk about possibly referral to [facility], he declined but will let me know if he changes his mind  Cough: Exam is benign, mild bronchitis? See instructions. RTC 8 months

## 2015-09-08 ENCOUNTER — Encounter (HOSPITAL_COMMUNITY): Admission: RE | Payer: Self-pay | Source: Ambulatory Visit

## 2015-09-08 ENCOUNTER — Ambulatory Visit (HOSPITAL_COMMUNITY): Admission: RE | Admit: 2015-09-08 | Payer: Medicare Other | Source: Ambulatory Visit | Admitting: Cardiovascular Disease

## 2015-09-08 SURGERY — RIGHT/LEFT HEART CATH AND CORONARY ANGIOGRAPHY
Anesthesia: LOCAL

## 2015-09-13 DIAGNOSIS — D485 Neoplasm of uncertain behavior of skin: Secondary | ICD-10-CM | POA: Diagnosis not present

## 2015-09-13 DIAGNOSIS — L2084 Intrinsic (allergic) eczema: Secondary | ICD-10-CM | POA: Diagnosis not present

## 2015-09-13 DIAGNOSIS — I8311 Varicose veins of right lower extremity with inflammation: Secondary | ICD-10-CM | POA: Diagnosis not present

## 2015-09-13 LAB — AFB CULTURE WITH SMEAR (NOT AT ARMC): ACID FAST SMEAR: NONE SEEN

## 2015-09-19 DIAGNOSIS — L97312 Non-pressure chronic ulcer of right ankle with fat layer exposed: Secondary | ICD-10-CM | POA: Diagnosis not present

## 2015-09-19 DIAGNOSIS — I872 Venous insufficiency (chronic) (peripheral): Secondary | ICD-10-CM | POA: Diagnosis not present

## 2015-09-19 DIAGNOSIS — L089 Local infection of the skin and subcutaneous tissue, unspecified: Secondary | ICD-10-CM | POA: Diagnosis not present

## 2015-09-19 DIAGNOSIS — Z87891 Personal history of nicotine dependence: Secondary | ICD-10-CM | POA: Diagnosis not present

## 2015-09-26 DIAGNOSIS — I872 Venous insufficiency (chronic) (peripheral): Secondary | ICD-10-CM | POA: Diagnosis not present

## 2015-09-26 DIAGNOSIS — G629 Polyneuropathy, unspecified: Secondary | ICD-10-CM | POA: Diagnosis not present

## 2015-09-26 DIAGNOSIS — Z87891 Personal history of nicotine dependence: Secondary | ICD-10-CM | POA: Diagnosis not present

## 2015-09-26 DIAGNOSIS — L97312 Non-pressure chronic ulcer of right ankle with fat layer exposed: Secondary | ICD-10-CM | POA: Diagnosis not present

## 2015-09-28 DIAGNOSIS — D224 Melanocytic nevi of scalp and neck: Secondary | ICD-10-CM | POA: Diagnosis not present

## 2015-10-04 DIAGNOSIS — L97312 Non-pressure chronic ulcer of right ankle with fat layer exposed: Secondary | ICD-10-CM | POA: Diagnosis not present

## 2015-10-04 DIAGNOSIS — I872 Venous insufficiency (chronic) (peripheral): Secondary | ICD-10-CM | POA: Diagnosis not present

## 2015-10-04 DIAGNOSIS — G629 Polyneuropathy, unspecified: Secondary | ICD-10-CM | POA: Diagnosis not present

## 2015-10-04 DIAGNOSIS — Z87891 Personal history of nicotine dependence: Secondary | ICD-10-CM | POA: Diagnosis not present

## 2015-10-04 DIAGNOSIS — R413 Other amnesia: Secondary | ICD-10-CM | POA: Diagnosis not present

## 2015-10-10 DIAGNOSIS — I872 Venous insufficiency (chronic) (peripheral): Secondary | ICD-10-CM | POA: Diagnosis not present

## 2015-10-10 DIAGNOSIS — Z87891 Personal history of nicotine dependence: Secondary | ICD-10-CM | POA: Diagnosis not present

## 2015-10-10 DIAGNOSIS — L97312 Non-pressure chronic ulcer of right ankle with fat layer exposed: Secondary | ICD-10-CM | POA: Diagnosis not present

## 2015-10-10 DIAGNOSIS — G629 Polyneuropathy, unspecified: Secondary | ICD-10-CM | POA: Diagnosis not present

## 2015-10-17 DIAGNOSIS — I872 Venous insufficiency (chronic) (peripheral): Secondary | ICD-10-CM | POA: Diagnosis not present

## 2015-10-17 DIAGNOSIS — Z87891 Personal history of nicotine dependence: Secondary | ICD-10-CM | POA: Diagnosis not present

## 2015-10-17 DIAGNOSIS — G629 Polyneuropathy, unspecified: Secondary | ICD-10-CM | POA: Diagnosis not present

## 2015-10-17 DIAGNOSIS — L97312 Non-pressure chronic ulcer of right ankle with fat layer exposed: Secondary | ICD-10-CM | POA: Diagnosis not present

## 2015-10-17 DIAGNOSIS — D090 Carcinoma in situ of bladder: Secondary | ICD-10-CM | POA: Diagnosis not present

## 2015-10-17 DIAGNOSIS — F319 Bipolar disorder, unspecified: Secondary | ICD-10-CM | POA: Diagnosis not present

## 2015-10-19 LAB — TSH: TSH: 1.19 u[IU]/mL (ref <=5.90)

## 2015-10-19 LAB — LIPID PANEL
Cholesterol: 143 mg/dL (ref 0–200)
HDL: 38 mg/dL (ref 35–70)
LDL CALC: 77 mg/dL
Triglycerides: 138 mg/dL (ref 40–160)

## 2015-10-19 LAB — HEPATIC FUNCTION PANEL
ALT: 28 U/L (ref 10–40)
AST: 32 U/L (ref 14–40)
Alkaline Phosphatase: 84 U/L (ref 25–125)
BILIRUBIN DIRECT: 0.2 mg/dL
BILIRUBIN, TOTAL: 0.5 mg/dL

## 2015-10-19 LAB — PSA: PSA: 0.505

## 2015-10-19 LAB — CBC AND DIFFERENTIAL
HEMATOCRIT: 45 % (ref 41–53)
HEMOGLOBIN: 15.3 g/dL (ref 13.5–17.5)
Neutrophils Absolute: 3 /uL
Platelets: 121 10*3/uL — AB (ref 150–399)
WBC: 5.9 10*3/mL

## 2015-10-19 LAB — HEMOGLOBIN A1C: HEMOGLOBIN A1C: 5.3

## 2015-10-24 ENCOUNTER — Ambulatory Visit (INDEPENDENT_AMBULATORY_CARE_PROVIDER_SITE_OTHER): Payer: Medicare Other

## 2015-10-24 VITALS — BP 130/76 | HR 51 | Ht 61.0 in | Wt 205.2 lb

## 2015-10-24 DIAGNOSIS — D090 Carcinoma in situ of bladder: Secondary | ICD-10-CM | POA: Diagnosis not present

## 2015-10-24 DIAGNOSIS — Z0000 Encounter for general adult medical examination without abnormal findings: Secondary | ICD-10-CM | POA: Diagnosis not present

## 2015-10-24 DIAGNOSIS — F319 Bipolar disorder, unspecified: Secondary | ICD-10-CM | POA: Diagnosis not present

## 2015-10-24 DIAGNOSIS — L97312 Non-pressure chronic ulcer of right ankle with fat layer exposed: Secondary | ICD-10-CM | POA: Diagnosis not present

## 2015-10-24 DIAGNOSIS — I872 Venous insufficiency (chronic) (peripheral): Secondary | ICD-10-CM | POA: Diagnosis not present

## 2015-10-24 DIAGNOSIS — G629 Polyneuropathy, unspecified: Secondary | ICD-10-CM | POA: Diagnosis not present

## 2015-10-24 DIAGNOSIS — Z87891 Personal history of nicotine dependence: Secondary | ICD-10-CM | POA: Diagnosis not present

## 2015-10-24 NOTE — Progress Notes (Signed)
Pre visit review using our clinic review tool, if applicable. No additional management support is needed unless otherwise documented below in the visit note. 

## 2015-10-24 NOTE — Patient Instructions (Addendum)
Continue doing brain stimulating activities (puzzles, reading, adult coloring books, staying active) to keep memory sharp.   Eat heart healthy diet (full of fruits, vegetables, whole grains, lean protein, water--limit salt, fat, and sugar intake) and stay active as possible.    Follow up with Dr. Larose Kells as scheduled.  Bring in copy of Advanced Directive at next office visit.    Bring copy of lab results from [place] to next visit so we can put them in your chart.      Fall Prevention in the Home  Falls can cause injuries. They can happen to people of all ages. There are many things you can do to make your home safe and to help prevent falls.  WHAT CAN I DO ON THE OUTSIDE OF MY HOME?  Regularly fix the edges of walkways and driveways and fix any cracks.  Remove anything that might make you trip as you walk through a door, such as a raised step or threshold.  Trim any bushes or trees on the path to your home.  Use bright outdoor lighting.  Clear any walking paths of anything that might make someone trip, such as rocks or tools.  Regularly check to see if handrails are loose or broken. Make sure that both sides of any steps have handrails.  Any raised decks and porches should have guardrails on the edges.  Have any leaves, snow, or ice cleared regularly.  Use sand or salt on walking paths during winter.  Clean up any spills in your garage right away. This includes oil or grease spills. WHAT CAN I DO IN THE BATHROOM?   Use night lights.  Install grab bars by the toilet and in the tub and shower. Do not use towel bars as grab bars.  Use non-skid mats or decals in the tub or shower.  If you need to sit down in the shower, use a plastic, non-slip stool.  Keep the floor dry. Clean up any water that spills on the floor as soon as it happens.  Remove soap buildup in the tub or shower regularly.  Attach bath mats securely with double-sided non-slip rug tape.  Do not have throw rugs and  other things on the floor that can make you trip. WHAT CAN I DO IN THE BEDROOM?  Use night lights.  Make sure that you have a light by your bed that is easy to reach.  Do not use any sheets or blankets that are too big for your bed. They should not hang down onto the floor.  Have a firm chair that has side arms. You can use this for support while you get dressed.  Do not have throw rugs and other things on the floor that can make you trip. WHAT CAN I DO IN THE KITCHEN?  Clean up any spills right away.  Avoid walking on wet floors.  Keep items that you use a lot in easy-to-reach places.  If you need to reach something above you, use a strong step stool that has a grab bar.  Keep electrical cords out of the way.  Do not use floor polish or wax that makes floors slippery. If you must use wax, use non-skid floor wax.  Do not have throw rugs and other things on the floor that can make you trip. WHAT CAN I DO WITH MY STAIRS?  Do not leave any items on the stairs.  Make sure that there are handrails on both sides of the stairs and use them.  Fix handrails that are broken or loose. Make sure that handrails are as long as the stairways.  Check any carpeting to make sure that it is firmly attached to the stairs. Fix any carpet that is loose or worn.  Avoid having throw rugs at the top or bottom of the stairs. If you do have throw rugs, attach them to the floor with carpet tape.  Make sure that you have a light switch at the top of the stairs and the bottom of the stairs. If you do not have them, ask someone to add them for you. WHAT ELSE CAN I DO TO HELP PREVENT FALLS?  Wear shoes that:  Do not have high heels.  Have rubber bottoms.  Are comfortable and fit you well.  Are closed at the toe. Do not wear sandals.  If you use a stepladder:  Make sure that it is fully opened. Do not climb a closed stepladder.  Make sure that both sides of the stepladder are locked into  place.  Ask someone to hold it for you, if possible.  Clearly mark and make sure that you can see:  Any grab bars or handrails.  First and last steps.  Where the edge of each step is.  Use tools that help you move around (mobility aids) if they are needed. These include:  Canes.  Walkers.  Scooters.  Crutches.  Turn on the lights when you go into a dark area. Replace any light bulbs as soon as they burn out.  Set up your furniture so you have a clear path. Avoid moving your furniture around.  If any of your floors are uneven, fix them.  If there are any pets around you, be aware of where they are.  Review your medicines with your doctor. Some medicines can make you feel dizzy. This can increase your chance of falling. Ask your doctor what other things that you can do to help prevent falls.   This information is not intended to replace advice given to you by your health care provider. Make sure you discuss any questions you have with your health care provider.   Document Released: 05/04/2009 Document Revised: 11/22/2014 Document Reviewed: 08/12/2014 Elsevier Interactive Patient Education 2016 [place] Maintenance, Male A healthy lifestyle and preventative care can promote health and wellness.  Maintain regular health, dental, and eye exams.  Eat a healthy diet. Foods like vegetables, fruits, whole grains, low-fat dairy products, and lean protein foods contain the nutrients you need and are low in calories. Decrease your intake of foods high in solid fats, added sugars, and salt. Get information about a proper diet from your health care provider, if necessary.  Regular physical exercise is one of the most important things you can do for your health. Most adults should get at least 150 minutes of moderate-intensity exercise (any activity that increases your heart rate and causes you to sweat) each week. In addition, most adults need muscle-strengthening  exercises on 2 or more days a week.   Maintain a healthy weight. The body mass index (BMI) is a screening tool to identify possible weight problems. It provides an estimate of body fat based on height and weight. Your health care provider can find your BMI and can help you achieve or maintain a healthy weight. For males 20 years and older:  A BMI below 18.5 is considered underweight.  A BMI of 18.5 to 24.9 is normal.  A BMI of 25 to 29.9 is considered  overweight.  A BMI of 30 and above is considered obese.  Maintain normal blood lipids and cholesterol by exercising and minimizing your intake of saturated fat. Eat a balanced diet with plenty of fruits and vegetables. Blood tests for lipids and cholesterol should begin at age 32 and be repeated every 5 years. If your lipid or cholesterol levels are high, you are over age 66, or you are at high risk for heart disease, you may need your cholesterol levels checked more frequently.Ongoing high lipid and cholesterol levels should be treated with medicines if diet and exercise are not working.  If you smoke, find out from your health care provider how to quit. If you do not use tobacco, do not start.  Lung cancer screening is recommended for adults aged 55-80 years who are at high risk for developing lung cancer because of a history of smoking. A yearly low-dose CT scan of the lungs is recommended for people who have at least a 30-pack-year history of smoking and are current smokers or have quit within the past 15 years. A pack year of smoking is smoking an average of 1 pack of cigarettes a day for 1 year (for example, a 30-pack-year history of smoking could mean smoking 1 pack a day for 30 years or 2 packs a day for 15 years). Yearly screening should continue until the smoker has stopped smoking for at least 15 years. Yearly screening should be stopped for people who develop a health problem that would prevent them from having lung cancer treatment.  If  you choose to drink alcohol, do not have more than 2 drinks per day. One drink is considered to be 12 oz (360 mL) of beer, 5 oz (150 mL) of wine, or 1.5 oz (45 mL) of liquor.  Avoid the use of street drugs. Do not share needles with anyone. Ask for help if you need support or instructions about stopping the use of drugs.  High blood pressure causes heart disease and increases the risk of stroke. High blood pressure is more likely to develop in:  People who have blood pressure in the end of the normal range (100-139/85-89 mm Hg).  People who are overweight or obese.  People who are African American.  If you are 49-39 years of age, have your blood pressure checked every 3-5 years. If you are 80 years of age or older, have your blood pressure checked every year. You should have your blood pressure measured twice--once when you are at a hospital or clinic, and once when you are not at a hospital or clinic. Record the average of the two measurements. To check your blood pressure when you are not at a hospital or clinic, you can use:  An automated blood pressure machine at a pharmacy.  A home blood pressure monitor.  If you are 76-46 years old, ask your health care provider if you should take aspirin to prevent heart disease.  Diabetes screening involves taking a blood sample to check your fasting blood sugar level. This should be done once every 3 years after age 69 if you are at a normal weight and without risk factors for diabetes. Testing should be considered at a younger age or be carried out more frequently if you are overweight and have at least 1 risk factor for diabetes.  Colorectal cancer can be detected and often prevented. Most routine colorectal cancer screening begins at the age of 61 and continues through age 67. However, your health care provider  may recommend screening at an earlier age if you have risk factors for colon cancer. On a yearly basis, your health care provider may  provide home test kits to check for hidden blood in the stool. A small camera at the end of a tube may be used to directly examine the colon (sigmoidoscopy or colonoscopy) to detect the earliest forms of colorectal cancer. Talk to your health care provider about this at age 64 when routine screening begins. A direct exam of the colon should be repeated every 5-10 years through age 57, unless early forms of precancerous polyps or small growths are found.  People who are at an increased risk for hepatitis B should be screened for this virus. You are considered at high risk for hepatitis B if:  You were born in a country where hepatitis B occurs often. Talk with your health care provider about which countries are considered high risk.  Your parents were born in a high-risk country and you have not received a shot to protect against hepatitis B (hepatitis B vaccine).  You have HIV or AIDS.  You use needles to inject street drugs.  You live with, or have sex with, someone who has hepatitis B.  You are a man who has sex with other men (MSM).  You get hemodialysis treatment.  You take certain medicines for conditions like cancer, organ transplantation, and autoimmune conditions.  Hepatitis C blood testing is recommended for all people born from [birth year] through 1965 and any individual with known risk factors for hepatitis C.  Healthy men should no longer receive prostate-specific antigen (PSA) blood tests as part of routine cancer screening. Talk to your health care provider about prostate cancer screening.  Testicular cancer screening is not recommended for adolescents or adult males who have no symptoms. Screening includes self-exam, a health care provider exam, and other screening tests. Consult with your health care provider about any symptoms you have or any concerns you have about testicular cancer.  Practice safe sex. Use condoms and avoid high-risk sexual practices to reduce the spread of  sexually transmitted infections (STIs).  You should be screened for STIs, including gonorrhea and chlamydia if:  You are sexually active and are younger than 24 years.  You are older than 24 years, and your health care provider tells you that you are at risk for this type of infection.  Your sexual activity has changed since you were last screened, and you are at an increased risk for chlamydia or gonorrhea. Ask your health care provider if you are at risk.  If you are at risk of being infected with HIV, it is recommended that you take a prescription medicine daily to prevent HIV infection. This is called pre-exposure prophylaxis (PrEP). You are considered at risk if:  You are a man who has sex with other men (MSM).  You are a heterosexual man who is sexually active with multiple partners.  You take drugs by injection.  You are sexually active with a partner who has HIV.  Talk with your health care provider about whether you are at high risk of being infected with HIV. If you choose to begin PrEP, you should first be tested for HIV. You should then be tested every 3 months for as long as you are taking PrEP.  Use sunscreen. Apply sunscreen liberally and repeatedly throughout the day. You should seek shade when your shadow is shorter than you. Protect yourself by wearing long sleeves, pants, a wide-brimmed hat, and  sunglasses year round whenever you are outdoors.  Tell your health care provider of new moles or changes in moles, especially if there is a change in shape or color. Also, tell your health care provider if a mole is larger than the size of a pencil eraser.  A one-time screening for abdominal aortic aneurysm (AAA) and surgical repair of large AAAs by ultrasound is recommended for men aged 27-75 years who are current or former smokers.  Stay current with your vaccines (immunizations).   This information is not intended to replace advice given to you by your health care provider.  Make sure you discuss any questions you have with your health care provider.   Document Released: 01/04/2008 Document Revised: 07/29/2014 Document Reviewed: 12/03/2010 Elsevier Interactive Patient Education [employer].

## 2015-10-24 NOTE — Progress Notes (Addendum)
Subjective:   Shaun Yoder is a 70 y.o. male who presents for an Initial Medicare Annual Wellness Visit.  He is accompanied by his wife today.    Review of Systems: No ROS Cardiac Risk Factors include: advanced age (>37men, >13 women);male gender;obesity (BMI >30kg/m2);sedentary lifestyle; Hyperlipidemia Sleep patterns:  Sleeps at least 3-4 hours per night/naps some during the day Home Safety/Smoke Alarms:  Feels safe at home.  Lives with wife in one level home.  Smoke alarms present.  Alarm system and dog present.   Firearm Safety:  No firearms.   Seat Belt Safety/Bike Helmet:  Always wears seat belt.     Counseling:   Eye Exam- Last exam: 10/10/15 Dental-  Upper and lower dentures.  Next appt next month.   Male:  CCS- completed CT colonoscopy - Normal per pt---[facility]--called office to request a copy of report; message left for call back----report received       CT Colonoscopy Impression:  6 mm polyp, mild diverticulosis, left pulmonary infiltrate, bilateral pulmonary nodules  PSA- 03/17/09- 1.10 normal    Objective:    Today's Vitals   10/24/15 1025  BP: 130/76  Pulse: 51  Height: 5\' 1"  (1.549 m)  Weight: 205 lb 3.2 oz (93.078 kg)  SpO2: 93%  PainSc: 0-No pain   Body mass index is 38.79 kg/(m^2).  Current Medications (verified) Outpatient Encounter Prescriptions as of 10/24/2015  Medication Sig  . ARIPiprazole (ABILIFY) 30 MG tablet Take 30 mg by mouth at bedtime.    [name] aspirin 81 MG tablet Take 81 mg by mouth daily.    . calcium carbonate (OS-CAL) 600 MG TABS Take 1,200 mg by mouth daily.    . calcium-vitamin D (CALCIUM 500/D) 500-200 MG-UNIT tablet Take by mouth.  . collagenase (SANTYL) ointment Apply 1 application topically daily.  . divalproex (DEPAKOTE ER) 500 MG 24 hr tablet 4 tabs at bedtime  . folic acid (FOLVITE) 1 MG tablet Take 1 mg by mouth daily.    [name] gabapentin (NEURONTIN) 300 MG capsule Take 300 mg by mouth 2 (two) times daily.   .  meloxicam (MOBIC) 15 MG tablet Take 15 mg by mouth daily.  . metoprolol succinate (TOPROL-XL) 25 MG 24 hr tablet Take 12.5 mg by mouth daily.  . mirabegron ER (MYRBETRIQ) 50 MG TB24 tablet Take 50 mg by mouth daily.  . Multiple Vitamins-Minerals (MULTIVITAMINS THER. W/MINERALS) TABS Take 1 tablet by mouth daily.    . Multiple Vitamins-Minerals (ZINC PO) Take by mouth daily.  . Omega-3 1000 MG CAPS Take by mouth.  [name] omeprazole (PRILOSEC) 20 MG capsule Take 40 mg by mouth every morning.    . ranitidine (ZANTAC) 150 MG tablet Take 1 tablet (150 mg total) by mouth at bedtime.  . vitamin C (ASCORBIC ACID) 500 MG tablet Take 500 mg by mouth daily.  . ziprasidone (GEODON) 60 MG capsule 1 tablet every AM and PM and 2 at bedtime  . Abatacept 125 MG/ML SOSY Inject into the skin once a week. Reported on 10/24/2015  . methotrexate (RHEUMATREX) 2.5 MG tablet Take 25 mg by mouth once a week. Reported on 10/24/2015  . [DISCONTINUED] Fish Oil OIL Take 1 capsule by mouth daily. Reported on 10/24/2015  . [DISCONTINUED] Multiple Vitamin (MULTIVITAMIN) capsule Take by mouth. Reported on 10/24/2015   No facility-administered encounter medications on file as of 10/24/2015.    Allergies (verified) Leflunomide; Morphine and related; and Plaquenil   History: Past Medical History  Diagnosis Date  .  Cancer ([place])     bladder  . Varicose vein   . OA (osteoarthritis)   . Hyperlipidemia   . GERD (gastroesophageal reflux disease)   . Schizoaffective disorder, bipolar type ([place])   . Anemia   . Iron deficiency   . Hypertension   . Low back pain   . Colitis   . Gastritis   . Depression   . Sleep apnea   . Colon polyps   . Diverticulosis   . History of rectal polyps    Past Surgical History  Procedure Laterality Date  . Total knee arthroplasty Bilateral   . Bladder surgery    . Varicose vein surgery     Family History  Problem Relation Age of Onset  . Heart attack Mother   . Heart attack Father   . Stroke  Father   . Rheumatologic disease Neg Hx   . Colon cancer Neg Hx   . Lung disease Neg Hx   . Prostate cancer Neg Hx    Social History   Occupational History  . retired    Social History Main Topics  . Smoking status: Former Smoker -- 2.50 packs/day for 43 years    Types: Cigarettes    Quit date: 07/22/1998  . Smokeless tobacco: Not on file  . Alcohol Use: 0.0 oz/week    0 Standard drinks or equivalent per week     Comment: Occasional glass of wine  . Drug Use: No  . Sexual Activity: Not on file   Tobacco Counseling Counseling given: No   Activities of Daily Living In your present state of health, do you have any difficulty performing the following activities: 10/24/2015 03/22/2015  Hearing? N N  Vision? N N  Difficulty concentrating or making decisions? Y N  Walking or climbing stairs? Y Y  Dressing or bathing? N N  Doing errands, shopping? Shaun Yoder  Preparing Food and eating ? N -  Using the Toilet? N -  In the past six months, have you accidently leaked urine? Y -  Do you have problems with loss of bowel control? N -  Managing your Medications? Y -  Managing your Finances? Y -  Housekeeping or managing your Housekeeping? N -    Immunizations and Health Maintenance Immunization History  Administered Date(s) Administered  . Influenza, High Dose Seasonal PF 05/23/2015  . Influenza-Unspecified 04/21/2014  . Pneumococcal Conjugate-13 04/21/2014  . Pneumococcal Polysaccharide-23 05/23/2015  . Tdap 11/19/2010   There are no preventive care reminders to display for this patient.  Patient Care Team: Colon Branch, MD as PCP - General (Internal Medicine) [name], MD as Consulting Physician (Rheumatology) Sherren Mocha, MD as Consulting Physician (Cardiology) Javier Glazier, MD as Consulting Physician (Pulmonary Disease) Rolley Sims, OD as Consulting Physician (Orthopedic Surgery)  Eulogio Bear, [place] [place]  Dr. Denyse Amass [place]-orthopedic Dr.  D[facility] Salvia- [facility] doctor at [facility] any recent Medical Services you may have received from other than [facility] providers in the past year (date may be approximate).    Assessment:   This is a routine wellness examination for Shaun Yoder.   Hearing/Vision screen Hearing Screening Comments: No problems with hearing.  Able to hear conversational tones.    Vision Screening Comments: [facility]  [facility]Dr. Julien [facility]irt  Dietary issues and exercise activities discussed: Current Exercise Habits: The patient does not participate in regular exercise at present, Exercise limited by: orthopedic condition(s)---has RA and OA.    Diet:  Regular diet. Eats 2-3 meals per day.  Mostly home cooked meals.     Goals    None    --no goal set during this visit.    Depression Screen PHQ 2/9 Scores 10/24/2015 03/22/2015  PHQ - 2 Score 0 0    Fall Risk Fall Risk  10/24/2015 03/22/2015  Falls in the past year? No No  Risk for fall due to : Impaired mobility Impaired mobility;Impaired balance/gait  Risk for fall due to (comments): Walks with walker; electric wheelchair for long distance; ramps present.  -    Cognitive Function: MMSE - Mini Mental State Exam 10/24/2015  Orientation to time 5  Orientation to Place 5  Registration 3  Attention/ Calculation 5  Recall 2  Language- name 2 objects 2  Language- repeat 1  Language- follow 3 step command 3  Language- read & follow direction 1  Write a sentence 1  Copy design 1  Total score 29    Screening Tests Health Maintenance  Topic Date Due  . ZOSTAVAX  11/06/2015 (Originally 06/04/2006)  . INFLUENZA VACCINE  02/20/2016  . TETANUS/TDAP  11/18/2020  . COLONOSCOPY  11/09/2024  . Hepatitis C Screening  Completed  . PNA vac Low Risk Adult  Completed        Plan:  Continue doing brain stimulating activities (puzzles, reading, adult coloring books, staying active) to keep memory sharp.   Eat heart healthy diet (full of  fruits, vegetables, whole grains, lean protein, water--limit salt, fat, and sugar intake) and stay active as possible.    Follow up with Dr. Larose Kells as scheduled.  Bring in copy of Advanced Directive at next office visit.    Bring copy of lab results from [place] to next visit so we can put them in your chart.        During the course of the visit [place] was educated and counseled about the following appropriate screening and preventive services:   Vaccines to include Pneumoccal, Influenza, Hepatitis B, Td, Zostavax, HCV  Electrocardiogram  Colorectal cancer screening  Cardiovascular disease screening  Diabetes screening  Glaucoma screening  Nutrition counseling  Prostate cancer screening  Smoking cessation counseling  Patient Instructions (the written plan) were given to the patient.   Rudene Anda, RN   10/25/2015   Agree, French Ana MD

## 2015-10-25 DIAGNOSIS — L97312 Non-pressure chronic ulcer of right ankle with fat layer exposed: Secondary | ICD-10-CM | POA: Insufficient documentation

## 2015-10-31 DIAGNOSIS — Z87891 Personal history of nicotine dependence: Secondary | ICD-10-CM | POA: Diagnosis not present

## 2015-10-31 DIAGNOSIS — F259 Schizoaffective disorder, unspecified: Secondary | ICD-10-CM | POA: Diagnosis not present

## 2015-10-31 DIAGNOSIS — D090 Carcinoma in situ of bladder: Secondary | ICD-10-CM | POA: Diagnosis not present

## 2015-10-31 DIAGNOSIS — G629 Polyneuropathy, unspecified: Secondary | ICD-10-CM | POA: Diagnosis not present

## 2015-10-31 DIAGNOSIS — F319 Bipolar disorder, unspecified: Secondary | ICD-10-CM | POA: Diagnosis not present

## 2015-10-31 DIAGNOSIS — L97312 Non-pressure chronic ulcer of right ankle with fat layer exposed: Secondary | ICD-10-CM | POA: Diagnosis not present

## 2015-10-31 DIAGNOSIS — M1990 Unspecified osteoarthritis, unspecified site: Secondary | ICD-10-CM | POA: Diagnosis not present

## 2015-10-31 DIAGNOSIS — R413 Other amnesia: Secondary | ICD-10-CM | POA: Diagnosis not present

## 2015-11-01 ENCOUNTER — Telehealth: Payer: Self-pay | Admitting: Internal Medicine

## 2015-11-01 NOTE — Telephone Encounter (Signed)
Receive results from a virtual colonoscopy 11/10/2014: 6 mm transverse colon polyp,  pulmonary infiltrates  (follow-up by pulmonary) Please ask patient if he was ever seen by GI regards the colon polyps. If he has not, we can discuss that when he returns to the office.

## 2015-11-02 NOTE — Telephone Encounter (Signed)
MyChart message sent. Results abstracted and sent for scanning.

## 2015-11-06 NOTE — Telephone Encounter (Signed)
Letter printed and mailed to Pt.  

## 2015-11-07 DIAGNOSIS — F319 Bipolar disorder, unspecified: Secondary | ICD-10-CM | POA: Diagnosis not present

## 2015-11-07 DIAGNOSIS — I890 Lymphedema, not elsewhere classified: Secondary | ICD-10-CM | POA: Diagnosis not present

## 2015-11-07 DIAGNOSIS — R413 Other amnesia: Secondary | ICD-10-CM | POA: Diagnosis not present

## 2015-11-07 DIAGNOSIS — I872 Venous insufficiency (chronic) (peripheral): Secondary | ICD-10-CM | POA: Diagnosis not present

## 2015-11-07 DIAGNOSIS — M1990 Unspecified osteoarthritis, unspecified site: Secondary | ICD-10-CM | POA: Diagnosis not present

## 2015-11-07 DIAGNOSIS — F209 Schizophrenia, unspecified: Secondary | ICD-10-CM | POA: Diagnosis not present

## 2015-11-07 DIAGNOSIS — L97312 Non-pressure chronic ulcer of right ankle with fat layer exposed: Secondary | ICD-10-CM | POA: Diagnosis not present

## 2015-11-07 DIAGNOSIS — G629 Polyneuropathy, unspecified: Secondary | ICD-10-CM | POA: Diagnosis not present

## 2015-11-07 DIAGNOSIS — Z87891 Personal history of nicotine dependence: Secondary | ICD-10-CM | POA: Diagnosis not present

## 2015-11-14 DIAGNOSIS — I890 Lymphedema, not elsewhere classified: Secondary | ICD-10-CM | POA: Diagnosis not present

## 2015-11-14 DIAGNOSIS — L97312 Non-pressure chronic ulcer of right ankle with fat layer exposed: Secondary | ICD-10-CM | POA: Diagnosis not present

## 2015-11-14 DIAGNOSIS — Z87891 Personal history of nicotine dependence: Secondary | ICD-10-CM | POA: Diagnosis not present

## 2015-11-14 DIAGNOSIS — D090 Carcinoma in situ of bladder: Secondary | ICD-10-CM | POA: Diagnosis not present

## 2015-11-14 DIAGNOSIS — I872 Venous insufficiency (chronic) (peripheral): Secondary | ICD-10-CM | POA: Diagnosis not present

## 2015-11-14 DIAGNOSIS — F209 Schizophrenia, unspecified: Secondary | ICD-10-CM | POA: Diagnosis not present

## 2015-11-14 DIAGNOSIS — F319 Bipolar disorder, unspecified: Secondary | ICD-10-CM | POA: Diagnosis not present

## 2015-11-22 DIAGNOSIS — D090 Carcinoma in situ of bladder: Secondary | ICD-10-CM | POA: Diagnosis not present

## 2015-11-22 DIAGNOSIS — L97312 Non-pressure chronic ulcer of right ankle with fat layer exposed: Secondary | ICD-10-CM | POA: Diagnosis not present

## 2015-11-22 DIAGNOSIS — F209 Schizophrenia, unspecified: Secondary | ICD-10-CM | POA: Diagnosis not present

## 2015-11-22 DIAGNOSIS — I890 Lymphedema, not elsewhere classified: Secondary | ICD-10-CM | POA: Diagnosis not present

## 2015-11-22 DIAGNOSIS — G629 Polyneuropathy, unspecified: Secondary | ICD-10-CM | POA: Diagnosis not present

## 2015-11-22 DIAGNOSIS — I872 Venous insufficiency (chronic) (peripheral): Secondary | ICD-10-CM | POA: Diagnosis not present

## 2015-11-22 DIAGNOSIS — M1990 Unspecified osteoarthritis, unspecified site: Secondary | ICD-10-CM | POA: Diagnosis not present

## 2015-11-22 DIAGNOSIS — Z87891 Personal history of nicotine dependence: Secondary | ICD-10-CM | POA: Diagnosis not present

## 2015-11-22 DIAGNOSIS — F319 Bipolar disorder, unspecified: Secondary | ICD-10-CM | POA: Diagnosis not present

## 2015-11-28 DIAGNOSIS — F209 Schizophrenia, unspecified: Secondary | ICD-10-CM | POA: Diagnosis not present

## 2015-11-28 DIAGNOSIS — M1990 Unspecified osteoarthritis, unspecified site: Secondary | ICD-10-CM | POA: Diagnosis not present

## 2015-11-28 DIAGNOSIS — Z87891 Personal history of nicotine dependence: Secondary | ICD-10-CM | POA: Diagnosis not present

## 2015-11-28 DIAGNOSIS — G629 Polyneuropathy, unspecified: Secondary | ICD-10-CM | POA: Diagnosis not present

## 2015-11-28 DIAGNOSIS — L97312 Non-pressure chronic ulcer of right ankle with fat layer exposed: Secondary | ICD-10-CM | POA: Diagnosis not present

## 2015-11-28 DIAGNOSIS — I890 Lymphedema, not elsewhere classified: Secondary | ICD-10-CM | POA: Diagnosis not present

## 2015-11-28 DIAGNOSIS — D090 Carcinoma in situ of bladder: Secondary | ICD-10-CM | POA: Diagnosis not present

## 2015-11-28 DIAGNOSIS — I872 Venous insufficiency (chronic) (peripheral): Secondary | ICD-10-CM | POA: Diagnosis not present

## 2015-11-28 DIAGNOSIS — F319 Bipolar disorder, unspecified: Secondary | ICD-10-CM | POA: Diagnosis not present

## 2015-11-30 LAB — FECAL OCCULT BLOOD, GUAIAC: FECAL OCCULT BLD: NEGATIVE

## 2015-12-05 DIAGNOSIS — L97312 Non-pressure chronic ulcer of right ankle with fat layer exposed: Secondary | ICD-10-CM | POA: Diagnosis not present

## 2015-12-05 DIAGNOSIS — G629 Polyneuropathy, unspecified: Secondary | ICD-10-CM | POA: Diagnosis not present

## 2015-12-05 DIAGNOSIS — I872 Venous insufficiency (chronic) (peripheral): Secondary | ICD-10-CM | POA: Diagnosis not present

## 2015-12-05 DIAGNOSIS — D090 Carcinoma in situ of bladder: Secondary | ICD-10-CM | POA: Diagnosis not present

## 2015-12-05 DIAGNOSIS — Z87891 Personal history of nicotine dependence: Secondary | ICD-10-CM | POA: Diagnosis not present

## 2015-12-05 DIAGNOSIS — F209 Schizophrenia, unspecified: Secondary | ICD-10-CM | POA: Diagnosis not present

## 2015-12-05 DIAGNOSIS — F319 Bipolar disorder, unspecified: Secondary | ICD-10-CM | POA: Diagnosis not present

## 2015-12-05 DIAGNOSIS — I890 Lymphedema, not elsewhere classified: Secondary | ICD-10-CM | POA: Diagnosis not present

## 2015-12-05 DIAGNOSIS — M1990 Unspecified osteoarthritis, unspecified site: Secondary | ICD-10-CM | POA: Diagnosis not present

## 2015-12-06 ENCOUNTER — Encounter: Payer: Self-pay | Admitting: Internal Medicine

## 2015-12-06 ENCOUNTER — Ambulatory Visit (HOSPITAL_BASED_OUTPATIENT_CLINIC_OR_DEPARTMENT_OTHER)
Admission: RE | Admit: 2015-12-06 | Discharge: 2015-12-06 | Disposition: A | Discharge status: Home / Self Care | Payer: Medicare Other | Source: Ambulatory Visit | Attending: Internal Medicine | Admitting: Internal Medicine

## 2015-12-06 ENCOUNTER — Encounter (HOSPITAL_BASED_OUTPATIENT_CLINIC_OR_DEPARTMENT_OTHER): Payer: Self-pay

## 2015-12-06 ENCOUNTER — Ambulatory Visit (INDEPENDENT_AMBULATORY_CARE_PROVIDER_SITE_OTHER): Payer: Medicare Other | Admitting: Internal Medicine

## 2015-12-06 VITALS — BP 124/72 | HR 79 | Temp 98.4°F | Ht 61.0 in | Wt 208.4 lb

## 2015-12-06 DIAGNOSIS — I83009 Varicose veins of unspecified lower extremity with ulcer of unspecified site: Secondary | ICD-10-CM

## 2015-12-06 DIAGNOSIS — R059 Cough, unspecified: Secondary | ICD-10-CM

## 2015-12-06 DIAGNOSIS — I517 Cardiomegaly: Secondary | ICD-10-CM | POA: Insufficient documentation

## 2015-12-06 DIAGNOSIS — R918 Other nonspecific abnormal finding of lung field: Secondary | ICD-10-CM | POA: Insufficient documentation

## 2015-12-06 DIAGNOSIS — R05 Cough: Secondary | ICD-10-CM | POA: Diagnosis not present

## 2015-12-06 DIAGNOSIS — L97909 Non-pressure chronic ulcer of unspecified part of unspecified lower leg with unspecified severity: Secondary | ICD-10-CM

## 2015-12-06 MED ORDER — DOXYCYCLINE HYCLATE 100 MG PO TABS: 100.0000 mg | ORAL_TABLET | Freq: Two times a day (BID) | ORAL | Status: DC

## 2015-12-06 NOTE — Patient Instructions (Signed)
Get your chest x-ray downstairs  Rest, fluids , tylenol  For cough:  Take Mucinex DM twice a day as needed until better  For nasal congestion: Use OTC Nasocort or Flonase : 2 nasal sprays on each side of the nose in the morning until you feel better   Avoid decongestants such as  Pseudoephedrine or phenylephrine     Take the antibiotic as prescribed  (doxycycline)  Call if not gradually better over the next  10 days  Call anytime if the symptoms are severe, you have high fever, more short of breath, chest pain

## 2015-12-06 NOTE — Progress Notes (Signed)
Pre visit review using our clinic review tool, if applicable. No additional management support is needed unless otherwise documented below in the visit note. 

## 2015-12-06 NOTE — Assessment & Plan Note (Signed)
Cough: Likely bronchitis however the patient is at risk for complications thus I like to get a chest x-ray to be sure nothing else is going on. Otherwise treat with doxycycline. See instructions. R.A.-- current currently holding MTX and injectables due to varicose vein ulcers Varicose veins wounds: Currently f/u by [facility] to start prednisone pack today which is okay with me.

## 2015-12-06 NOTE — Progress Notes (Signed)
Subjective:    Patient ID: Shaun Yoder, male    DOB: 09/19/1945, 70 y.o.   MRN: [id number]  DOS:  12/06/2015 Type of visit - description : Acute, here with his wife Interval history:  2 weeks history of chest congestion, cough which is worse at night. No other family members affected, not taking any specific medication for the sx We also talk about his lower extremity ulcers.  Review of Systems  Denies fever chills + Runny nose Shortness of breath is slightly above baseline. No nausea, vomiting, diarrhea No hemoptysis.  Past Medical History  Diagnosis Date  . Cancer ([place])     bladder  . Varicose vein   . OA (osteoarthritis)   . Hyperlipidemia   . GERD (gastroesophageal reflux disease)   . Schizoaffective disorder, bipolar type ([place])   . Anemia   . Iron deficiency   . Hypertension   . Low back pain   . Colitis   . Gastritis   . Depression   . Sleep apnea   . Colon polyps   . Diverticulosis   . History of rectal polyps     Past Surgical History  Procedure Laterality Date  . Total knee arthroplasty Bilateral   . Bladder surgery    . Varicose vein surgery      Social History   Social History  . Marital Status: Married    Spouse Name: N/A  . Number of Children: 2  . Years of Education: N/A   Occupational History  . retired    Social History Main Topics  . Smoking status: Former Smoker -- 2.50 packs/day for 43 years    Types: Cigarettes    Quit date: 07/22/1998  . Smokeless tobacco: Not on file  . Alcohol Use: 0.0 oz/week    0 Standard drinks or equivalent per week     Comment: Occasional glass of wine  . Drug Use: No  . Sexual Activity: Not on file   Other Topics Concern  . Not on file   Social History Narrative   Originally from [place]. Moved to [facility] in July 22, 1985. 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 No known asbestos exposure. 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 No international travel. Has a dog currently. Remote exposure to Cockatiel in a different home. No mold exposure. No hot tub exposure.       Two adopted children        Medication List       This list is accurate as of: 12/06/15  2:46 PM.  Always use your most recent med list.               Abatacept 125 MG/ML Sosy  Inject into the skin once a week. Reported on 10/24/2015     ARIPiprazole 30 MG tablet  Commonly known as:  ABILIFY  Take 30 mg by mouth at bedtime.     aspirin 81 MG tablet  Take 81 mg by mouth daily.     CALCIUM 500/D 500-200 MG-UNIT tablet  Generic drug:  calcium-vitamin D  Take by mouth.     calcium carbonate 600 MG Tabs tablet  Commonly known as:  OS-CAL  Take 1,200 mg by mouth daily.     collagenase ointment  Commonly known as:  SANTYL  Apply 1 application topically daily.  divalproex 500 MG 24 hr tablet  Commonly known as:  DEPAKOTE ER  4 tabs at bedtime     doxycycline 100 MG tablet  Commonly known as:  VIBRA-TABS  Take 1 tablet (100 mg total) by mouth 2 (two) times daily.     folic acid 1 MG tablet  Commonly known as:  FOLVITE  Take 1 mg by mouth daily.     gabapentin 300 MG capsule  Commonly known as:  NEURONTIN  Take 300 mg by mouth 2 (two) times daily.     meloxicam 15 MG tablet  Commonly known as:  MOBIC  Take 15 mg by mouth daily.     methotrexate 2.5 MG tablet  Commonly known as:  RHEUMATREX  Take 25 mg by mouth once a week. Reported on 12/06/2015     methylPREDNISolone 4 MG Tbpk tablet  Commonly known as:  MEDROL DOSEPAK  Tapering     metoprolol succinate 25 MG 24 hr tablet  Commonly known as:  TOPROL-XL  Take 12.5 mg by mouth daily.     mirabegron ER 50 MG Tb24 tablet  Commonly known as:  MYRBETRIQ  Take 50 mg by mouth daily.     multivitamins ther. w/minerals Tabs tablet  Take 1 tablet by mouth daily.     Omega-3 1000  MG Caps  Take by mouth.     omeprazole 20 MG capsule  Commonly known as:  PRILOSEC  Take 40 mg by mouth every morning.     ranitidine 150 MG tablet  Commonly known as:  ZANTAC  Take 1 tablet (150 mg total) by mouth at bedtime.     vitamin C 500 MG tablet  Commonly known as:  ASCORBIC ACID  Take 500 mg by mouth daily.     ZINC PO  Take by mouth daily.     ziprasidone 60 MG capsule  Commonly known as:  GEODON  1 tablet every AM and PM and 2 at bedtime           Objective:   Physical Exam BP 124/72 mmHg  Pulse 79  Temp(Src) 98.4 F (36.9 C) (Oral)  Ht 5\' 1"  (1.549 m)  Wt 208 lb 6 oz (94.518 kg)  BMI 39.39 kg/m2  SpO2 92% General:   Well developed, well nourished . NAD.  HEENT:  Normocephalic . Face symmetric, atraumatic. TMs normal, nose with minimal congestion, sinuses no TTP. Throat symmetric, no red. Lungs:  Mild large airway congestion. No crackles Normal respiratory effort, no intercostal retractions, no accessory muscle use. Heart: RRR,  + noticeable systolic murmur.  Skin: Not pale. Not jaundice Neurologic:  alert & oriented X3.  Speech normal, gait  assisted Psych--  Cognition and judgment appear intact.  Cooperative with normal attention span and concentration.  Behavior appropriate. No anxious or depressed appearing.      Assessment & Plan:   Assessment > Hypertension Hyperlipidemia Ao stenosis, severe: Dr Burt Knack MS[facility]: --Seronegative Rheumatoid arthritis --DJD --low back pain d/t DJD --Sees Dr. [name], Dr Tamera Punt (ortho) Schizophrenia, bipolar, depression: Follow-up at the [facility] in [facility] Bladder cancer-transitional cell, Dr. Shona Needles GI:   GERD, history of colitis, history of gastritis, colon polyps, diverticulosis Pulmomany: Dr Ashok Cordia --Sleep apnea-- Cpap intolerant --CT nodules f/u 2 pulmonary Varicose veins: s/p remote surgery, has LE wounds  H/o  iron deficiency anemia  Plan Cough: Likely bronchitis however the patient is at  risk for complications thus I like to get a chest x-ray to be sure nothing else is  going on. Otherwise treat with doxycycline. See instructions. R.A.-- current currently holding MTX and injectables due to varicose vein ulcers Varicose veins wounds: Currently f/u by [facility] to start prednisone pack today which is okay with me.

## 2015-12-12 DIAGNOSIS — M1990 Unspecified osteoarthritis, unspecified site: Secondary | ICD-10-CM | POA: Diagnosis not present

## 2015-12-12 DIAGNOSIS — I890 Lymphedema, not elsewhere classified: Secondary | ICD-10-CM | POA: Diagnosis not present

## 2015-12-12 DIAGNOSIS — Z87891 Personal history of nicotine dependence: Secondary | ICD-10-CM | POA: Diagnosis not present

## 2015-12-12 DIAGNOSIS — L97312 Non-pressure chronic ulcer of right ankle with fat layer exposed: Secondary | ICD-10-CM | POA: Diagnosis not present

## 2015-12-12 DIAGNOSIS — F319 Bipolar disorder, unspecified: Secondary | ICD-10-CM | POA: Diagnosis not present

## 2015-12-12 DIAGNOSIS — D090 Carcinoma in situ of bladder: Secondary | ICD-10-CM | POA: Diagnosis not present

## 2015-12-12 DIAGNOSIS — G629 Polyneuropathy, unspecified: Secondary | ICD-10-CM | POA: Diagnosis not present

## 2015-12-12 DIAGNOSIS — I872 Venous insufficiency (chronic) (peripheral): Secondary | ICD-10-CM | POA: Diagnosis not present

## 2015-12-12 DIAGNOSIS — R413 Other amnesia: Secondary | ICD-10-CM | POA: Diagnosis not present

## 2015-12-12 DIAGNOSIS — F209 Schizophrenia, unspecified: Secondary | ICD-10-CM | POA: Diagnosis not present

## 2015-12-19 DIAGNOSIS — G629 Polyneuropathy, unspecified: Secondary | ICD-10-CM | POA: Diagnosis not present

## 2015-12-19 DIAGNOSIS — F209 Schizophrenia, unspecified: Secondary | ICD-10-CM | POA: Diagnosis not present

## 2015-12-19 DIAGNOSIS — M1990 Unspecified osteoarthritis, unspecified site: Secondary | ICD-10-CM | POA: Diagnosis not present

## 2015-12-19 DIAGNOSIS — I890 Lymphedema, not elsewhere classified: Secondary | ICD-10-CM | POA: Diagnosis not present

## 2015-12-19 DIAGNOSIS — R413 Other amnesia: Secondary | ICD-10-CM | POA: Diagnosis not present

## 2015-12-19 DIAGNOSIS — F319 Bipolar disorder, unspecified: Secondary | ICD-10-CM | POA: Diagnosis not present

## 2015-12-19 DIAGNOSIS — Z87891 Personal history of nicotine dependence: Secondary | ICD-10-CM | POA: Diagnosis not present

## 2015-12-19 DIAGNOSIS — I872 Venous insufficiency (chronic) (peripheral): Secondary | ICD-10-CM | POA: Diagnosis not present

## 2015-12-19 DIAGNOSIS — L97312 Non-pressure chronic ulcer of right ankle with fat layer exposed: Secondary | ICD-10-CM | POA: Diagnosis not present

## 2015-12-19 DIAGNOSIS — D090 Carcinoma in situ of bladder: Secondary | ICD-10-CM | POA: Diagnosis not present

## 2015-12-21 ENCOUNTER — Telehealth: Payer: Self-pay | Admitting: Internal Medicine

## 2015-12-21 DIAGNOSIS — H6120 Impacted cerumen, unspecified ear: Secondary | ICD-10-CM

## 2015-12-21 NOTE — Telephone Encounter (Signed)
Please advise 

## 2015-12-21 NOTE — Telephone Encounter (Signed)
If so desired, schedule a nurse visit for a ear lavage. Otherwise okay to refer to ENT for cerumen impaction

## 2015-12-21 NOTE — Telephone Encounter (Signed)
Caller name: Arlene Relation to pt: spouse Call back number: [phone] or cell [phone] Pharmacy:  Reason for call: Spouse came in office stating pt was seen for ear clogged up (12-06-15) and was recommended to use peroxide for his ears and it did not help, pt would like to have a referral for an ear doctor in [facility] if possible. Spouse also left at the front desk documents of his last visit at [facility] to have scanned in his chart with information of his visit. Please advise.

## 2015-12-21 NOTE — Telephone Encounter (Signed)
Referral placed to [facility]NT

## 2015-12-22 ENCOUNTER — Encounter: Payer: Self-pay | Admitting: Internal Medicine

## 2015-12-22 NOTE — Telephone Encounter (Signed)
Labs from the [facility] 10/19/2015 Cholesterol 143, triglycerides 1:30, HDL 38, LDL 77 PSA 0.5 A1c 5.3 CBC unremarkable except for platelets of 121 LFTs normal TSH 1.1

## 2015-12-26 DIAGNOSIS — Z87891 Personal history of nicotine dependence: Secondary | ICD-10-CM | POA: Diagnosis not present

## 2015-12-26 DIAGNOSIS — G629 Polyneuropathy, unspecified: Secondary | ICD-10-CM | POA: Diagnosis not present

## 2015-12-26 DIAGNOSIS — D090 Carcinoma in situ of bladder: Secondary | ICD-10-CM | POA: Diagnosis not present

## 2015-12-26 DIAGNOSIS — L97312 Non-pressure chronic ulcer of right ankle with fat layer exposed: Secondary | ICD-10-CM | POA: Diagnosis not present

## 2015-12-26 DIAGNOSIS — I890 Lymphedema, not elsewhere classified: Secondary | ICD-10-CM | POA: Diagnosis not present

## 2015-12-26 DIAGNOSIS — R413 Other amnesia: Secondary | ICD-10-CM | POA: Diagnosis not present

## 2015-12-26 DIAGNOSIS — F209 Schizophrenia, unspecified: Secondary | ICD-10-CM | POA: Diagnosis not present

## 2015-12-26 DIAGNOSIS — F319 Bipolar disorder, unspecified: Secondary | ICD-10-CM | POA: Diagnosis not present

## 2015-12-26 DIAGNOSIS — I872 Venous insufficiency (chronic) (peripheral): Secondary | ICD-10-CM | POA: Diagnosis not present

## 2015-12-29 DIAGNOSIS — H6120 Impacted cerumen, unspecified ear: Secondary | ICD-10-CM | POA: Diagnosis not present

## 2015-12-29 DIAGNOSIS — H6982 Other specified disorders of Eustachian tube, left ear: Secondary | ICD-10-CM | POA: Diagnosis not present

## 2016-01-02 DIAGNOSIS — I890 Lymphedema, not elsewhere classified: Secondary | ICD-10-CM | POA: Diagnosis not present

## 2016-01-02 DIAGNOSIS — D090 Carcinoma in situ of bladder: Secondary | ICD-10-CM | POA: Diagnosis not present

## 2016-01-02 DIAGNOSIS — R413 Other amnesia: Secondary | ICD-10-CM | POA: Diagnosis not present

## 2016-01-02 DIAGNOSIS — G629 Polyneuropathy, unspecified: Secondary | ICD-10-CM | POA: Diagnosis not present

## 2016-01-02 DIAGNOSIS — F209 Schizophrenia, unspecified: Secondary | ICD-10-CM | POA: Diagnosis not present

## 2016-01-02 DIAGNOSIS — I872 Venous insufficiency (chronic) (peripheral): Secondary | ICD-10-CM | POA: Diagnosis not present

## 2016-01-02 DIAGNOSIS — F319 Bipolar disorder, unspecified: Secondary | ICD-10-CM | POA: Diagnosis not present

## 2016-01-02 DIAGNOSIS — L97312 Non-pressure chronic ulcer of right ankle with fat layer exposed: Secondary | ICD-10-CM | POA: Diagnosis not present

## 2016-01-09 DIAGNOSIS — I890 Lymphedema, not elsewhere classified: Secondary | ICD-10-CM | POA: Diagnosis not present

## 2016-01-09 DIAGNOSIS — L97312 Non-pressure chronic ulcer of right ankle with fat layer exposed: Secondary | ICD-10-CM | POA: Diagnosis not present

## 2016-01-09 DIAGNOSIS — I872 Venous insufficiency (chronic) (peripheral): Secondary | ICD-10-CM | POA: Diagnosis not present

## 2016-01-16 DIAGNOSIS — Z79899 Other long term (current) drug therapy: Secondary | ICD-10-CM | POA: Diagnosis not present

## 2016-01-16 DIAGNOSIS — I872 Venous insufficiency (chronic) (peripheral): Secondary | ICD-10-CM | POA: Diagnosis not present

## 2016-01-16 DIAGNOSIS — L97312 Non-pressure chronic ulcer of right ankle with fat layer exposed: Secondary | ICD-10-CM | POA: Diagnosis not present

## 2016-01-16 DIAGNOSIS — I890 Lymphedema, not elsewhere classified: Secondary | ICD-10-CM | POA: Diagnosis not present

## 2016-01-22 ENCOUNTER — Ambulatory Visit (INDEPENDENT_AMBULATORY_CARE_PROVIDER_SITE_OTHER)
Admission: RE | Admit: 2016-01-22 | Discharge: 2016-01-22 | Disposition: A | Discharge status: Home / Self Care | Payer: Medicare Other | Source: Ambulatory Visit | Attending: Pulmonary Disease | Admitting: Pulmonary Disease

## 2016-01-22 DIAGNOSIS — R918 Other nonspecific abnormal finding of lung field: Secondary | ICD-10-CM

## 2016-01-22 DIAGNOSIS — R911 Solitary pulmonary nodule: Secondary | ICD-10-CM | POA: Diagnosis not present

## 2016-01-30 ENCOUNTER — Telehealth: Payer: Self-pay | Admitting: Pulmonary Disease

## 2016-01-30 DIAGNOSIS — D090 Carcinoma in situ of bladder: Secondary | ICD-10-CM | POA: Diagnosis not present

## 2016-01-30 DIAGNOSIS — F209 Schizophrenia, unspecified: Secondary | ICD-10-CM | POA: Diagnosis not present

## 2016-01-30 DIAGNOSIS — F319 Bipolar disorder, unspecified: Secondary | ICD-10-CM | POA: Diagnosis not present

## 2016-01-30 DIAGNOSIS — I872 Venous insufficiency (chronic) (peripheral): Secondary | ICD-10-CM | POA: Diagnosis not present

## 2016-01-30 DIAGNOSIS — G629 Polyneuropathy, unspecified: Secondary | ICD-10-CM | POA: Diagnosis not present

## 2016-01-30 DIAGNOSIS — M1990 Unspecified osteoarthritis, unspecified site: Secondary | ICD-10-CM | POA: Diagnosis not present

## 2016-01-30 DIAGNOSIS — I890 Lymphedema, not elsewhere classified: Secondary | ICD-10-CM | POA: Diagnosis not present

## 2016-01-30 DIAGNOSIS — L97312 Non-pressure chronic ulcer of right ankle with fat layer exposed: Secondary | ICD-10-CM | POA: Diagnosis not present

## 2016-01-30 DIAGNOSIS — Z87891 Personal history of nicotine dependence: Secondary | ICD-10-CM | POA: Diagnosis not present

## 2016-01-30 NOTE — Telephone Encounter (Signed)
Results have been explained to patient, pt expressed understanding. Nothing further needed.  Notes Recorded by Javier Glazier, MD on 01/28/2016 at 5:02 PM Please let the patient know I reviewed his CT scan. We will go over the results at his follow-up but this is nothing new to be concerned about. Thanks.

## 2016-02-15 ENCOUNTER — Encounter (INDEPENDENT_AMBULATORY_CARE_PROVIDER_SITE_OTHER): Payer: Medicare Other | Admitting: Pulmonary Disease

## 2016-02-15 ENCOUNTER — Ambulatory Visit (INDEPENDENT_AMBULATORY_CARE_PROVIDER_SITE_OTHER): Payer: Medicare Other | Admitting: Pulmonary Disease

## 2016-02-15 ENCOUNTER — Encounter: Payer: Self-pay | Admitting: Pulmonary Disease

## 2016-02-15 VITALS — BP 110/66 | HR 55 | Ht 61.0 in | Wt 207.4 lb

## 2016-02-15 DIAGNOSIS — K219 Gastro-esophageal reflux disease without esophagitis: Secondary | ICD-10-CM | POA: Diagnosis not present

## 2016-02-15 DIAGNOSIS — R05 Cough: Secondary | ICD-10-CM | POA: Diagnosis not present

## 2016-02-15 DIAGNOSIS — R059 Cough, unspecified: Secondary | ICD-10-CM

## 2016-02-15 DIAGNOSIS — G4733 Obstructive sleep apnea (adult) (pediatric): Secondary | ICD-10-CM | POA: Diagnosis not present

## 2016-02-15 DIAGNOSIS — R918 Other nonspecific abnormal finding of lung field: Secondary | ICD-10-CM

## 2016-02-15 LAB — PULMONARY FUNCTION TEST
DL/VA % PRED: 94 %
DL/VA: 3.69 ml/min/mmHg/L
DLCO COR % PRED: 68 %
DLCO COR: 13.76 ml/min/mmHg
DLCO UNC % PRED: 70 %
DLCO unc: 14.27 ml/min/mmHg
FEF 25-75 PRE: 1.4 L/s
FEF 25-75 Post: 3.2 L/sec
FEF2575-%CHANGE-POST: 129 %
FEF2575-%Pred-Post: 188 %
FEF2575-%Pred-Pre: 82 %
FEV1-%CHANGE-POST: 30 %
FEV1-%PRED-PRE: 51 %
FEV1-%Pred-Post: 67 %
FEV1-Post: 1.47 L
FEV1-Pre: 1.13 L
FEV1FVC-%Change-Post: -5 %
FEV1FVC-%Pred-Pre: 117 %
FEV6-%Change-Post: 38 %
FEV6-%PRED-POST: 63 %
FEV6-%PRED-PRE: 46 %
FEV6-POST: 1.8 L
FEV6-PRE: 1.3 L
FEV6FVC-%PRED-POST: 107 %
FEV6FVC-%PRED-PRE: 107 %
FVC-%Change-Post: 38 %
FVC-%Pred-Post: 59 %
FVC-%Pred-Pre: 42 %
FVC-Post: 1.8 L
FVC-Pre: 1.3 L
POST FEV6/FVC RATIO: 100 %
PRE FEV6/FVC RATIO: 100 %
Post FEV1/FVC ratio: 82 %
Pre FEV1/FVC ratio: 87 %

## 2016-02-15 MED ORDER — BUDESONIDE-FORMOTEROL FUMARATE 80-4.5 MCG/ACT IN AERO: 2.0000 | INHALATION_SPRAY | Freq: Two times a day (BID) | RESPIRATORY_TRACT | 1 refills | Status: DC

## 2016-02-15 NOTE — Progress Notes (Signed)
Subjective:    Patient ID: Shaun Yoder, male    DOB: 11/16/1945, 70 y.o.   MRN: [id number]  [facility].:  Follow-up for Cough, Bilateral Lung Nodules. OSA, GERD, & Rheumatoid Arthritis.  HPI  Cough: He reports his cough seems to be stable. He reports it is only intermittent and feels it is to help him clear his throat. Denies any change in his baseline dyspnea on exertion.   Bilateral Lung Nodules:  Likely secondary to underlying rheumatoid arthritis. Nodules not obvious on last CXR in October 2016. Seen on CT scan August 2016 initially. Continues to have lower lobe centrilobular nodules.  OSA:  Not currently on CPAP therapy. Unable to use CPAP due to claustrophobia.  GERD:  On Prilosec & Zantac. Compliant with medications. No reflux or dyspepsia. No morning brash water taste.  Rheumatoid Arthritis:  Follows with Dr. Trudie Reed of rheumatology. Remains off of DMARD.  Review of Systems No chest pain or pressure. No fever, chills, or sweats. Has routine bruising easily. No new rashes.  Allergies  Allergen Reactions  . Leflunomide Other (See Comments)    diarrhea  . Morphine And Related Nausea And Vomiting  . Plaquenil [Hydroxychloroquine Sulfate]     rash   Current Outpatient Prescriptions on File Prior to Visit  Medication Sig Dispense Refill  . ARIPiprazole (ABILIFY) 30 MG tablet Take 30 mg by mouth at bedtime.      [name] aspirin 81 MG tablet Take 81 mg by mouth daily.      . calcium carbonate (OS-CAL) 600 MG TABS Take 1,200 mg by mouth daily.      . calcium-vitamin D (CALCIUM 500/D) 500-200 MG-UNIT tablet Take by mouth.    . collagenase (SANTYL) ointment Apply 1 application topically daily. 30 g 0  . divalproex (DEPAKOTE ER) 500 MG 24 hr tablet 4 tabs at bedtime    . folic acid (FOLVITE) 1 MG tablet Take 1 mg by mouth daily.      [name] gabapentin (NEURONTIN) 300 MG capsule Take 300 mg by mouth 2 (two) times daily.     . meloxicam (MOBIC) 15 MG tablet Take 15 mg by mouth daily.    .  metoprolol succinate (TOPROL-XL) 25 MG 24 hr tablet Take 12.5 mg by mouth 2 (two) times daily.     . mirabegron ER (MYRBETRIQ) 50 MG TB24 tablet Take 50 mg by mouth daily.    . Multiple Vitamins-Minerals (ZINC PO) Take by mouth daily.    . Omega-3 1000 MG CAPS Take by mouth.    [name] omeprazole (PRILOSEC) 20 MG capsule Take 20 mg by mouth every morning.     . ranitidine (ZANTAC) 150 MG tablet Take 1 tablet (150 mg total) by mouth at bedtime. 90 tablet 3  . vitamin C (ASCORBIC ACID) 500 MG tablet Take 500 mg by mouth daily.    . ziprasidone (GEODON) 60 MG capsule 1 tablet every AM and PM and 2 at bedtime    . Abatacept 125 MG/ML SOSY Inject into the skin once a week. Reported on 10/24/2015    . methotrexate (RHEUMATREX) 2.5 MG tablet Take 25 mg by mouth once a week. Reported on 12/06/2015     No current facility-administered medications on file prior to visit.    Past Medical History:  Diagnosis Date  . Anemia   . Cancer ([place])    bladder  . Colitis   . Colon polyps   . Depression   . Diverticulosis   . Gastritis   .  GERD (gastroesophageal reflux disease)   . History of rectal polyps   . Hyperlipidemia   . Hypertension   . Iron deficiency   . Low back pain   . OA (osteoarthritis)   . Schizoaffective disorder, bipolar type ([place])   . Sleep apnea   . Varicose vein    Past Surgical History:  Procedure Laterality Date  . BLADDER SURGERY    . TOTAL KNEE ARTHROPLASTY Bilateral   . VARICOSE VEIN SURGERY     Family History  Problem Relation Age of Onset  . Heart attack Mother   . Heart attack Father   . Stroke Father   . Rheumatologic disease Neg Hx   . Colon cancer Neg Hx   . Lung disease Neg Hx   . Prostate cancer Neg Hx    Social History   Social History  . Marital status: Married    Spouse name: N/A  . Number of children: 2  . Years of education: N/A   Occupational History  . retired    Social History Main Topics  . Smoking status: Former Smoker    Packs/day: 2.50     Years: 43.00    Types: Cigarettes    Quit date: 07/22/1998  . Smokeless tobacco: None  . Alcohol use 0.0 oz/week     Comment: Occasional glass of wine  . Drug use: No  . Sexual activity: Not Asked   Other Topics Concern  . None   Social History Narrative   Originally from [place]. Moved to [facility] in July 22, 1985. 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 No known asbestos exposure. 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 No international travel. Has a dog currently. Remote exposure to Cockatiel in a different home. No mold exposure. No hot tub exposure.       Two adopted children      Objective:   Physical Exam BP 110/66 (BP Location: Left Arm, Cuff Size: Normal)   Pulse (!) 55   Ht 5\' 1"  (1.549 m)   Wt 185 lb (83.9 kg)   SpO2 92%   BMI 34.96 kg/m  General:  Awake. No distress. Accompanied by wife today.  Integument:  Warm & dry. No rash on exposed skin.  HEENT:  Tacky mucus membranes. No oral ulcers. No scleral icterus.  Cardiovascular:  Lower extremity edema persists. Systolic ejection murmur unchanged. Regular rate.  Pulmonary: Slightly diminished breath sounds in bases with minimal crackles. Overall good aeration. Normal work of breathing on room air. Abdomen: Soft. Normal bowel sounds. Protuberant. Nontender. Lymphatics: No appreciated cervical or supraclavicular lymphadenopathy.  PFT 02/15/16: FVC 1.30 L (42%) FEV1 1.13 L (51%) FEV1/FVC 0.7 FEF 2575 1.40 L (82%) positive bronchodilator response   DLCO corrected 68% (hemoglobin 16.0 & unable to perform lung volumes) 08/04/15: FVC 1.99 L (65%) FEV1 1.57 L (70%) FEV1/FVC 0.79 FEF 25-75 1.44 L (83%) negative bronchodilator response DLCO uncorrected 71%  IMAGING  CT CHEST W/O 01/22/16 (personally reviewed by me): 4 cm descending thoracic aortic aneurysm. No pathologic mediastinal  adenopathy. No pleural effusion or thickening. No pericardial effusion. Patchy groundglass micronodular 30 in the mid and lower lung zones bilaterally.  CXR PA/LAT 05/11/15 (previously reviewed by me):  No opacity appreciated. Low lung volumes. I don't appreciate any nodules or opacities. Heart normal in size. No pleural effusion.  BARIUM SWALLOW (  03/24/15): No aspiration or laryngeal penetration. Moderate esophageal dysmotility suggestive of chronic reflux. No hiatal hernia. No reflux elicited. No mass or stricture detected. Reflux evaluation was somewhat limited due to patient's immobility. Barium tablet became lodged in the vallecula.  CT CHEST W/O 03/16/15 (previously reviewed by me): Patchy groundglass nodules within the left upper lobe. Groundglass nodularity also noted within right middle lobe & lingula. There is some consolidation within the opacification of the medial segment of the left lower lobe. No pleural effusion or thickening. Patent esophagus with dilation to the level of the cervical spine. No pathologic mediastinal adenopathy. No pericardial effusion. Radiology noted an 8 mm nodule within the right middle lobe as well. Compared with prior imaging there is no clear improvement or worsening.  CARDIAC TTE (12/21/14): LV normal in size with moderate concentric LVH. EF 65-70%. No regional wall motion abnormalities. Grade 1 diastolic dysfunction. RV poorly visualized but cavity size appeared normal with normal systolic function. Pulmonary artery systolic pressure 52 mmHg. LA mildly dilated. RA normal in size. Severe aortic stenosis. No mitral stenosis or regurgitation. Pulmonic valve poorly visualized. Mild tricuspid regurgitation.  MICROBIOLOGY SPUTUM (08/01/15):  Oral Flora / AFB negative / Fungus negative SPUTUM (03/21/15):  Oral Flora / AFB negative / Fungus negative    Assessment & Plan:  70 year old male with a long history of rheumatoid arthritis followed by Dr. Trudie Reed currently off  treatment.The nodules seen on his CT imaging remains subcentimeter I feel are likely secondary to his underlying rheumatoid arthritis. His reflux is well-controlled at this time. I reviewed his CT imaging with he and his wife today. I also reviewed his primary function testing which showed a significant bronchodilator response with regards to his spirometry. I feel this is likely secondary to an underlying mixed type COPD and he would likely benefit from long-acting bronchodilator therapy as well as inhaled steroid therapy for treatment of his likely rheumatologic lung disease. There appears to be no signs of progression of his underlying lung disease therefore I do not feel that further investigation with surgical lung biopsy or bronchoscopy is necessary at this time. Additionally, I do not feel that reinitiating systemic immunosuppression for his probable underlying rheumatologic lung disease is necessary either. I will defer further treatment of his rheumatoid arthritis to his rheumatologist. I instructed the patient and his wife to contact my office if he had any further questions or concerns before his next appointment.  1. Cough: Probable underlying COPD mixed type. Starting Symbicort 80/4.5 with a spacer. Instructed on proper oral hygiene. Repeat spirometry with bronchodilator challenge at next appointment to ensure maximal bronchodilatation. Patient will contact my office for a more permanent prescription of Symbicort if he is able to tolerate. 2. Bilateral Lung Nodules: Likely secondary to underlying rheumatoid arthritis. Patient remains off immunosuppression. Initiating inhaled steroid therapy. 3. OSA: Patient previously intolerant of CPAP therapy due to claustrophobia. Defer repeat testing at this time. 4. GERD w/ Esophageal Dysmotility: Remains asymptomatic on Prilosec & Zantac. No changes at this time. 5. Health maintenance: Managed by rheumatology. 6. Follow-up: Return to clinic in 4-6 months or  sooner if needed.  Sonia Baller Ashok Cordia, M.D. [facility] Pulmonary & Critical Care Pager:  [phone] After 3pm or if no response, call [phone] 2:13 PM 02/15/16

## 2016-02-15 NOTE — Addendum Note (Signed)
Addended by: Inge Rise on: 02/15/2016 02:53 PM   Modules accepted: Orders

## 2016-02-15 NOTE — Patient Instructions (Signed)
   Remember to do your Symbicort inhaler before you put in your dentures in the morning and after you take them out at night to avoid getting thrush. Also make sure you rinse, gargle, and spit after using it.  Call our office for a 3 month prescription for Symbicort to take to the [place].  Call me if you have any new breathing problems or worsening in your cough before your next appointment.  TESTS ORDERED: 1. Spirometry with bronchodilator challenge at next appointment

## 2016-02-28 DIAGNOSIS — H6982 Other specified disorders of Eustachian tube, left ear: Secondary | ICD-10-CM | POA: Diagnosis not present

## 2016-03-01 ENCOUNTER — Other Ambulatory Visit: Payer: Self-pay | Admitting: Internal Medicine

## 2016-03-01 ENCOUNTER — Ambulatory Visit (HOSPITAL_BASED_OUTPATIENT_CLINIC_OR_DEPARTMENT_OTHER)
Admission: RE | Admit: 2016-03-01 | Discharge: 2016-03-01 | Disposition: A | Discharge status: Home / Self Care | Payer: Medicare Other | Source: Ambulatory Visit | Attending: Internal Medicine | Admitting: Internal Medicine

## 2016-03-01 ENCOUNTER — Encounter: Payer: Self-pay | Admitting: Internal Medicine

## 2016-03-01 ENCOUNTER — Ambulatory Visit (INDEPENDENT_AMBULATORY_CARE_PROVIDER_SITE_OTHER): Payer: Medicare Other | Admitting: Internal Medicine

## 2016-03-01 VITALS — BP 122/72 | HR 81 | Temp 98.0°F | Resp 14 | Ht 61.0 in | Wt 214.4 lb

## 2016-03-01 DIAGNOSIS — R269 Unspecified abnormalities of gait and mobility: Secondary | ICD-10-CM

## 2016-03-01 DIAGNOSIS — I7090 Unspecified atherosclerosis: Secondary | ICD-10-CM | POA: Insufficient documentation

## 2016-03-01 DIAGNOSIS — M549 Dorsalgia, unspecified: Secondary | ICD-10-CM

## 2016-03-01 DIAGNOSIS — M5136 Other intervertebral disc degeneration, lumbar region: Secondary | ICD-10-CM | POA: Insufficient documentation

## 2016-03-01 DIAGNOSIS — K635 Polyp of colon: Secondary | ICD-10-CM

## 2016-03-01 DIAGNOSIS — R1032 Left lower quadrant pain: Secondary | ICD-10-CM | POA: Diagnosis not present

## 2016-03-01 DIAGNOSIS — R296 Repeated falls: Secondary | ICD-10-CM | POA: Diagnosis not present

## 2016-03-01 MED ORDER — PREDNISONE 10 MG PO TABS: ORAL_TABLET | ORAL | 0 refills | Status: DC

## 2016-03-01 NOTE — Progress Notes (Signed)
Pre visit review using our clinic review tool, if applicable. No additional management support is needed unless otherwise documented below in the visit note. 

## 2016-03-01 NOTE — Progress Notes (Signed)
Subjective:    Patient ID: Shaun Yoder, male    DOB: [birth year], 70 y.o.   MRN: [id number]  DOS:  03/01/2016 Type of visit - description : Here with his wife, several issues Interval history: Like to discuss the labs done at the [place] a few months ago 2 weeks history of left groin pain, no mass, no rash. Hernia? 2 weeks history of right buttock pain without radiation to the right leg, occasionally sharp, sometimes feels tight in the leg and toes. He has some baseline numbness, not particularly worse. Had a virtual  Colonoscopy 10-2014, likes to discuss results Has frequent falls, concerned about it. Has a excoriation, left pretibial area, to see the wound care center next week   Review of Systems  Denies nausea, vomiting. + Bladder incontinence at baseline. No stool incontinence  Past Medical History:  Diagnosis Date  . Anemia   . Cancer ([place])    bladder  . Colitis   . Colon polyps   . Depression   . Diverticulosis   . Gastritis   . GERD (gastroesophageal reflux disease)   . History of rectal polyps   . Hyperlipidemia   . Hypertension   . Iron deficiency   . Low back pain   . OA (osteoarthritis)   . Schizoaffective disorder, bipolar type ([place])   . Sleep apnea   . Varicose vein     Past Surgical History:  Procedure Laterality Date  . BLADDER SURGERY    . TOTAL KNEE ARTHROPLASTY Bilateral   . VARICOSE VEIN SURGERY      Social History   Social History  . Marital status: Married    Spouse name: N/A  . Number of children: 2  . Years of education: N/A   Occupational History  . retired    Social History Main Topics  . Smoking status: Former Smoker    Packs/day: 2.50    Years: 43.00    Types: Cigarettes    Quit date: 07/22/1998  . Smokeless tobacco: Never Used  . Alcohol use 0.0 oz/week     Comment: Occasional glass of wine  . Drug use: No  . Sexual activity: Not on file   Other Topics Concern  . Not on file   Social History Narrative   Originally from [place]. Moved to [facility] in July 22, 1985. 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 No known asbestos exposure. 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 No international travel. Has a dog currently. Remote exposure to Cockatiel in a different home. No mold exposure. No hot tub exposure.       Two adopted children        Medication List       Accurate as of 03/01/16 11:59 PM. Always use your most recent med list.          Abatacept 125 MG/ML Sosy Inject into the skin once a week. Reported on 10/24/2015   ARIPiprazole 30 MG tablet Commonly known as:  ABILIFY Take 30 mg by mouth at bedtime.   aspirin 81 MG tablet Take 81 mg by mouth daily.   budesonide-formoterol 80-4.5 MCG/ACT inhaler Commonly known as:  SYMBICORT Inhale 2 puffs into the lungs 2 (two) times daily.   CALCIUM 500/D 500-200 MG-UNIT tablet Generic drug:  calcium-vitamin D Take by mouth.   calcium carbonate 600 MG  Tabs tablet Commonly known as:  OS-CAL Take 1,200 mg by mouth daily.   divalproex 500 MG 24 hr tablet Commonly known as:  DEPAKOTE ER 4 tabs at bedtime   folic acid 1 MG tablet Commonly known as:  FOLVITE Take 1 mg by mouth daily.   gabapentin 300 MG capsule Commonly known as:  NEURONTIN Take 300 mg by mouth 2 (two) times daily.   meloxicam 15 MG tablet Commonly known as:  MOBIC Take 15 mg by mouth daily.   methotrexate 2.5 MG tablet Commonly known as:  RHEUMATREX Take 25 mg by mouth once a week. Reported on 12/06/2015   metoprolol succinate 25 MG 24 hr tablet Commonly known as:  TOPROL-XL Take 12.5 mg by mouth 2 (two) times daily.   mirabegron ER 50 MG Tb24 tablet Commonly known as:  MYRBETRIQ Take 50 mg by mouth daily.   Omega-3 1000 MG Caps Take by mouth.   omeprazole 20 MG capsule Commonly known as:   PRILOSEC Take 20 mg by mouth every morning.   predniSONE 10 MG tablet Commonly known as:  DELTASONE 4 tablets x 2 days, 3 tabs x 2 days, 2 tabs x 2 days, 1 tab x 2 days   ranitidine 150 MG tablet Commonly known as:  ZANTAC Take 1 tablet (150 mg total) by mouth at bedtime.   vitamin C 500 MG tablet Commonly known as:  ASCORBIC ACID Take 500 mg by mouth daily.   ZINC PO Take by mouth daily.   ziprasidone 60 MG capsule Commonly known as:  GEODON 1 tablet every AM and PM and 2 at bedtime          Objective:   Physical Exam BP 122/72 (BP Location: Right Arm, Patient Position: Sitting, Cuff Size: Normal)   Pulse 81   Temp 98 F (36.7 C) (Oral)   Resp 14   Ht 5\' 1"  (1.549 m)   Wt 214 lb 6 oz (97.2 kg)   SpO2 94%   BMI 40.51 kg/m  General:   Well developed, chronically ill appearing, in no acute distress.[name]  HEENT:  Normocephalic . Face symmetric, atraumatic Lungs:  CTA B Normal respiratory effort, no intercostal retractions, no accessory muscle use. Heart: RRR,  significant systolic murmur.  Abdomen:  Unable to get on the table, exam done on the patient is standing, abdomen is soft, no tender; groin w/o mass, no hernia.  Skin:  Has 2 excoriations on the left pretibial area, superficial, no red, discharge, no odor. Neurologic:  alert & oriented X3.  Speech normal, gait extremely limited by MSK issues. He seems to be at baseline. Psych--  Cognition and judgment appear intact.  Cooperative with normal attention span and concentration.  Behavior appropriate. No anxious or depressed appearing.    Assessment & Plan:    Assessment   (new pt, 02-2015) Hypertension Hyperlipidemia Ao stenosis, severe: Dr Burt Knack [facility]: --Seronegative Rheumatoid arthritis --DJD --low back pain d/t DJD, Dr Sherlyn Lick  --Sees Dr. [name], Dr Tamera Punt (ortho) Schizophrenia, bipolar, depression: Follow-up at the [facility] in [facility] Bladder cancer-transitional cell, Dr. Shona Needles GI:   GERD,  history of colitis, history of gastritis,  colon polyps- had a virtual cscope 2016, + polyps, declined further eval Pulmomany: Dr Ashok Cordia --Sleep apnea-- Cpap intolerant --CT nodules f/u 2 pulmonary Varicose veins: s/p remote surgery, has LE wounds  H/o  iron deficiency anemia  PLAN  All labs done at the [place] a few months ago discussed with the patient. Colon polyps: s/p virtual  colonoscopy April 2016, + for polyp, did not have a f/u cscope, pros-cons of cscope, risk of cancer d/w pt and wife. He understands he is very high risk for any procedure and declined further eval. I agree. Gait d/o and frequent falls: He is definitely high risk, his options are physical therapy, modify his house, consistent use of a walker. He is doing all of the above. Next step would be to get 24-hour supervision.  Wound care: Left pretibial area without evidence of infection. Will be seen @ the [facility]  soon. Right buttock pain: Symptoms concerning for a radiculopathy, no obvious bladder or bowel incontinence. He also has a history of falls. We agreed to do a round of steroids and get a x-ray to be sure no acute FX. Groin pain: No objective evidence of hernia, rx observation, definitely seek help if he has a lump, nausea, vomiting.   Today, I spent more than 30   min with the patient: >50% of the time counseling regards every question he had for me, reviewed the chart.

## 2016-03-01 NOTE — Patient Instructions (Signed)
  GO TO THE FRONT DESK Schedule your next appointment for a  checkup in 6-8 months   STOP BY THE FIRST FLOOR:  get the XR    Take prednisone as prescribed for a few days only  If you have  severe pain on the right side, you are not improving gradually, you have fever chills or rash: call us Immediately

## 2016-03-03 NOTE — Assessment & Plan Note (Signed)
All labs done at the [place] a few months ago discussed with the patient. Colon polyps: s/p virtual colonoscopy April 2016, + for polyp, did not have a f/u cscope, pros-cons of cscope, risk of cancer d/w pt and wife. He understands he is very high risk for any procedure and declined further eval. I agree. Gait d/o and frequent falls: He is definitely high risk, his options are physical therapy, modify his house, consistent use of a walker. He is doing all of the above. Next step would be to get 24-hour supervision.  Wound care: Left pretibial area without evidence of infection. Will be seen @ the [facility]  soon. Right buttock pain: Symptoms concerning for a radiculopathy, no obvious bladder or bowel incontinence. He also has a history of falls. We agreed to do a round of steroids and get a x-ray to be sure no acute FX. Groin pain: No objective evidence of hernia, rx observation, definitely seek help if he has a lump, nausea, vomiting.

## 2016-03-04 DIAGNOSIS — M0589 Other rheumatoid arthritis with rheumatoid factor of multiple sites: Secondary | ICD-10-CM | POA: Diagnosis not present

## 2016-03-04 DIAGNOSIS — Z79899 Other long term (current) drug therapy: Secondary | ICD-10-CM | POA: Diagnosis not present

## 2016-03-04 DIAGNOSIS — M150 Primary generalized (osteo)arthritis: Secondary | ICD-10-CM | POA: Diagnosis not present

## 2016-03-04 DIAGNOSIS — M2550 Pain in unspecified joint: Secondary | ICD-10-CM | POA: Diagnosis not present

## 2016-03-04 DIAGNOSIS — I878 Other specified disorders of veins: Secondary | ICD-10-CM | POA: Diagnosis not present

## 2016-03-05 ENCOUNTER — Ambulatory Visit: Payer: Medicare Other | Admitting: Internal Medicine

## 2016-03-05 DIAGNOSIS — L97221 Non-pressure chronic ulcer of left calf limited to breakdown of skin: Secondary | ICD-10-CM | POA: Diagnosis not present

## 2016-03-05 DIAGNOSIS — R3915 Urgency of urination: Secondary | ICD-10-CM | POA: Diagnosis not present

## 2016-03-05 DIAGNOSIS — Z87891 Personal history of nicotine dependence: Secondary | ICD-10-CM | POA: Diagnosis not present

## 2016-03-05 DIAGNOSIS — L97821 Non-pressure chronic ulcer of other part of left lower leg limited to breakdown of skin: Secondary | ICD-10-CM | POA: Diagnosis not present

## 2016-03-05 DIAGNOSIS — I890 Lymphedema, not elsewhere classified: Secondary | ICD-10-CM | POA: Diagnosis not present

## 2016-03-05 DIAGNOSIS — F209 Schizophrenia, unspecified: Secondary | ICD-10-CM | POA: Diagnosis not present

## 2016-03-05 DIAGNOSIS — C670 Malignant neoplasm of trigone of bladder: Secondary | ICD-10-CM | POA: Diagnosis not present

## 2016-03-05 DIAGNOSIS — M1990 Unspecified osteoarthritis, unspecified site: Secondary | ICD-10-CM | POA: Diagnosis not present

## 2016-03-05 DIAGNOSIS — R413 Other amnesia: Secondary | ICD-10-CM | POA: Diagnosis not present

## 2016-03-05 DIAGNOSIS — Z8551 Personal history of malignant neoplasm of bladder: Secondary | ICD-10-CM | POA: Diagnosis not present

## 2016-03-05 DIAGNOSIS — I872 Venous insufficiency (chronic) (peripheral): Secondary | ICD-10-CM | POA: Diagnosis not present

## 2016-03-05 DIAGNOSIS — F319 Bipolar disorder, unspecified: Secondary | ICD-10-CM | POA: Diagnosis not present

## 2016-03-12 DIAGNOSIS — F319 Bipolar disorder, unspecified: Secondary | ICD-10-CM | POA: Diagnosis not present

## 2016-03-12 DIAGNOSIS — I890 Lymphedema, not elsewhere classified: Secondary | ICD-10-CM | POA: Diagnosis not present

## 2016-03-12 DIAGNOSIS — R413 Other amnesia: Secondary | ICD-10-CM | POA: Diagnosis not present

## 2016-03-12 DIAGNOSIS — L97221 Non-pressure chronic ulcer of left calf limited to breakdown of skin: Secondary | ICD-10-CM | POA: Diagnosis not present

## 2016-03-12 DIAGNOSIS — L97821 Non-pressure chronic ulcer of other part of left lower leg limited to breakdown of skin: Secondary | ICD-10-CM | POA: Diagnosis not present

## 2016-03-12 DIAGNOSIS — Z87891 Personal history of nicotine dependence: Secondary | ICD-10-CM | POA: Diagnosis not present

## 2016-03-12 DIAGNOSIS — L089 Local infection of the skin and subcutaneous tissue, unspecified: Secondary | ICD-10-CM | POA: Diagnosis not present

## 2016-03-12 DIAGNOSIS — G629 Polyneuropathy, unspecified: Secondary | ICD-10-CM | POA: Diagnosis not present

## 2016-03-12 DIAGNOSIS — M1990 Unspecified osteoarthritis, unspecified site: Secondary | ICD-10-CM | POA: Diagnosis not present

## 2016-03-12 DIAGNOSIS — I872 Venous insufficiency (chronic) (peripheral): Secondary | ICD-10-CM | POA: Diagnosis not present

## 2016-03-19 DIAGNOSIS — Z87891 Personal history of nicotine dependence: Secondary | ICD-10-CM | POA: Diagnosis not present

## 2016-03-19 DIAGNOSIS — F209 Schizophrenia, unspecified: Secondary | ICD-10-CM | POA: Diagnosis not present

## 2016-03-19 DIAGNOSIS — L089 Local infection of the skin and subcutaneous tissue, unspecified: Secondary | ICD-10-CM | POA: Diagnosis not present

## 2016-03-19 DIAGNOSIS — M1990 Unspecified osteoarthritis, unspecified site: Secondary | ICD-10-CM | POA: Diagnosis not present

## 2016-03-19 DIAGNOSIS — L97221 Non-pressure chronic ulcer of left calf limited to breakdown of skin: Secondary | ICD-10-CM | POA: Diagnosis not present

## 2016-03-19 DIAGNOSIS — R413 Other amnesia: Secondary | ICD-10-CM | POA: Diagnosis not present

## 2016-03-19 DIAGNOSIS — F319 Bipolar disorder, unspecified: Secondary | ICD-10-CM | POA: Diagnosis not present

## 2016-03-19 DIAGNOSIS — I872 Venous insufficiency (chronic) (peripheral): Secondary | ICD-10-CM | POA: Diagnosis not present

## 2016-03-19 DIAGNOSIS — I890 Lymphedema, not elsewhere classified: Secondary | ICD-10-CM | POA: Diagnosis not present

## 2016-03-19 DIAGNOSIS — D090 Carcinoma in situ of bladder: Secondary | ICD-10-CM | POA: Diagnosis not present

## 2016-03-19 DIAGNOSIS — L97821 Non-pressure chronic ulcer of other part of left lower leg limited to breakdown of skin: Secondary | ICD-10-CM | POA: Diagnosis not present

## 2016-03-20 ENCOUNTER — Ambulatory Visit (INDEPENDENT_AMBULATORY_CARE_PROVIDER_SITE_OTHER): Payer: Medicare Other | Admitting: Family Medicine

## 2016-03-20 ENCOUNTER — Telehealth: Payer: Self-pay | Admitting: Family Medicine

## 2016-03-20 ENCOUNTER — Ambulatory Visit (HOSPITAL_BASED_OUTPATIENT_CLINIC_OR_DEPARTMENT_OTHER)
Admission: RE | Admit: 2016-03-20 | Discharge: 2016-03-20 | Disposition: A | Discharge status: Home / Self Care | Payer: Medicare Other | Source: Ambulatory Visit | Attending: Family Medicine | Admitting: Family Medicine

## 2016-03-20 ENCOUNTER — Encounter: Payer: Self-pay | Admitting: Family Medicine

## 2016-03-20 ENCOUNTER — Other Ambulatory Visit: Payer: Self-pay | Admitting: Family Medicine

## 2016-03-20 VITALS — BP 118/78 | HR 57 | Temp 98.2°F | Wt 214.8 lb

## 2016-03-20 DIAGNOSIS — I82402 Acute embolism and thrombosis of unspecified deep veins of left lower extremity: Secondary | ICD-10-CM

## 2016-03-20 DIAGNOSIS — I82412 Acute embolism and thrombosis of left femoral vein: Secondary | ICD-10-CM | POA: Insufficient documentation

## 2016-03-20 DIAGNOSIS — M7989 Other specified soft tissue disorders: Secondary | ICD-10-CM

## 2016-03-20 DIAGNOSIS — M79662 Pain in left lower leg: Secondary | ICD-10-CM

## 2016-03-20 MED ORDER — RIVAROXABAN (XARELTO) VTE STARTER PACK (15 & 20 MG): ORAL_TABLET | ORAL | 0 refills | Status: DC

## 2016-03-20 NOTE — Progress Notes (Signed)
Chief Complaint  Patient presents with  . Leg Swelling    left leg pain and swelling, from knee donw to the foot. Right leg swollen does not hurt as bad,    Subjective: Patient is a 70 y.o. male here for LE swelling and pain.  LLE is painful and swollen while the R side is just swollen. This has been going on for 3 days. Not worsening. He uses a compression stocking system routinely. He does have a hx of peripheral vascular disease. Denies recent trauma, surgery, prolonged period of bed-rest, travel, new medication, or history of clots. No family history of bleeding disorder. No CP or SOB.   ROS: Heart: Denies chest pain or palpitations Lungs: Denies SOB or cough MSK: +Lower extremity swelling and L leg pain  Family History  Problem Relation Age of Onset  . Heart attack Mother   . Heart attack Father   . Stroke Father   . Rheumatologic disease Neg Hx   . Colon cancer Neg Hx   . Lung disease Neg Hx   . Prostate cancer Neg Hx     Past Medical History:  Diagnosis Date  . Anemia   . Cancer ([place])    bladder  . Colitis   . Colon polyps   . Depression   . Diverticulosis   . Gastritis   . GERD (gastroesophageal reflux disease)   . History of rectal polyps   . Hyperlipidemia   . Hypertension   . Iron deficiency   . Low back pain   . OA (osteoarthritis)   . Schizoaffective disorder, bipolar type ([place])   . Sleep apnea   . Varicose vein    Allergies  Allergen Reactions  . Leflunomide Other (See Comments)    diarrhea  . Morphine And Related Nausea And Vomiting  . Plaquenil [Hydroxychloroquine Sulfate]     rash    Current Outpatient Prescriptions:  .  Abatacept 125 MG/ML SOSY, Inject into the skin once a week. Reported on 10/24/2015, Disp: , Rfl:  .  ARIPiprazole (ABILIFY) 30 MG tablet, Take 30 mg by mouth at bedtime.  , Disp: , Rfl:  .  aspirin 81 MG tablet, Take 81 mg by mouth daily.  , Disp: , Rfl:  .  budesonide-formoterol (SYMBICORT) 80-4.5 MCG/ACT inhaler, Inhale 2  puffs into the lungs 2 (two) times daily., Disp: 1 Inhaler, Rfl: 1 .  calcium carbonate (OS-CAL) 600 MG TABS, Take 1,200 mg by mouth daily.  , Disp: , Rfl:  .  calcium-vitamin D (CALCIUM 500/D) 500-200 MG-UNIT tablet, Take by mouth., Disp: , Rfl:  .  divalproex (DEPAKOTE ER) 500 MG 24 hr tablet, 4 tabs at bedtime, Disp: , Rfl:  .  DOCOSAHEXAENOIC ACID PO, Take 1 tablet by mouth daily., Disp: , Rfl:  .  doxycycline (VIBRA-TABS) 100 MG tablet, Take 100 mg by mouth 2 (two) times daily. for 10 days, Disp: , Rfl: 0 .  folic acid (FOLVITE) 1 MG tablet, Take 1 mg by mouth daily.  , Disp: , Rfl:  .  gabapentin (NEURONTIN) 300 MG capsule, Take 300 mg by mouth 2 (two) times daily. , Disp: , Rfl:  .  meloxicam (MOBIC) 15 MG tablet, Take 15 mg by mouth daily., Disp: , Rfl:  .  methotrexate (RHEUMATREX) 2.5 MG tablet, Take 25 mg by mouth once a week. Reported on 12/06/2015, Disp: , Rfl:  .  metoprolol succinate (TOPROL-XL) 25 MG 24 hr tablet, Take 12.5 mg by mouth 2 (two) times daily. ,  Disp: , Rfl:  .  mirabegron ER (MYRBETRIQ) 50 MG TB24 tablet, Take 50 mg by mouth daily., Disp: , Rfl:  .  Multiple Vitamins-Minerals (ZINC PO), Take by mouth daily., Disp: , Rfl:  .  Omega-3 1000 MG CAPS, Take by mouth., Disp: , Rfl:  .  omeprazole (PRILOSEC) 20 MG capsule, Take 20 mg by mouth every morning. , Disp: , Rfl:  .  oxybutynin (DITROPAN) 5 MG tablet, Take 5 mg by mouth 2 (two) times daily., Disp: , Rfl:  .  predniSONE (DELTASONE) 10 MG tablet, 4 tablets x 2 days, 3 tabs x 2 days, 2 tabs x 2 days, 1 tab x 2 days, Disp: 20 tablet, Rfl: 0 .  ranitidine (ZANTAC) 150 MG tablet, Take 1 tablet (150 mg total) by mouth at bedtime., Disp: 90 tablet, Rfl: 3 .  vitamin C (ASCORBIC ACID) 500 MG tablet, Take 500 mg by mouth daily., Disp: , Rfl:  .  ziprasidone (GEODON) 60 MG capsule, 1 tablet every AM and PM and 2 at bedtime, Disp: , Rfl:   Objective: BP 118/78 (BP Location: Left Arm, Patient Position: Sitting, Cuff Size:  Normal)   Pulse (!) 57   Temp 98.2 F (36.8 C) (Oral)   Wt 214 lb 12.8 oz (97.4 kg)   SpO2 90%   BMI 40.59 kg/m  General: Awake, appears stated age Heart: RRR, 3/6 systolic murmur heard loudest at the aortic listening post. 2+ pitting edema b/l LE's up to knee. +radiation to carotids, no bruits (sound decreased with distance from heart). Lungs: CTAB, no rales, wheezes or rhonchi. Normal effort MSK: Walks with aid of walker. +L calf pain, negative Homan's sign.  Skin: +Hyperpigmentation of LE's b/l representing venous stasis dermatitis. There is a dressing over a wound on the L- the wound is clean and dry, there is no foul odor, drainage, or erythema. Psych: Age appropriate judgment and insight, normal affect and mood  Assessment and Plan: Calf pain, left - Plan: US Venous Img Lower Unilateral Left  Swelling of lower extremity - Plan: US Venous Img Lower Unilateral Left  Orders as above. Greatest concern is for LE DVT. Hold and call doppler. Xarelto if clot present. Elevation and continued compression if negative. F/u in 1 week if not improved. The patient voiced understanding and agreement to the plan.  Crosby Oyster [place]

## 2016-03-20 NOTE — Progress Notes (Signed)
+  [facility] F/u in 2-3 weeks to see how he is doing on medication. Will recheck renal and hepatic function as well.   AB- could we make sure this appt is scheduled please? He is free to follow up with either me or Dr. Larose Kells. Thank you.

## 2016-03-20 NOTE — Telephone Encounter (Signed)
Also please make sure he stops taking his Mobic (meloxicam) as it can increase his risk of bleeding. Thank you.

## 2016-03-20 NOTE — Progress Notes (Signed)
Pre visit review using our clinic review tool, if applicable. No additional management support is needed unless otherwise documented below in the visit note. 

## 2016-03-20 NOTE — Patient Instructions (Addendum)
Go downstairs and take a L off of the elevated and go to [address] of radiology. They are waiting for you. They will be contacting me regarding your results.

## 2016-03-20 NOTE — Telephone Encounter (Signed)
Spoke with the pt's wife and informed her of the note below.  She verbalized understanding and agreed.//AB/CMA

## 2016-03-20 NOTE — Telephone Encounter (Signed)
Called and [facility] @ 2:40pm @ [phone]) asking the pt's wife to RTC regarding note below.//AB/CMA

## 2016-03-21 ENCOUNTER — Encounter: Payer: Self-pay | Admitting: Family Medicine

## 2016-03-27 DIAGNOSIS — Z79899 Other long term (current) drug therapy: Secondary | ICD-10-CM | POA: Diagnosis not present

## 2016-03-27 DIAGNOSIS — I872 Venous insufficiency (chronic) (peripheral): Secondary | ICD-10-CM | POA: Diagnosis not present

## 2016-03-27 DIAGNOSIS — L97821 Non-pressure chronic ulcer of other part of left lower leg limited to breakdown of skin: Secondary | ICD-10-CM | POA: Diagnosis not present

## 2016-03-27 DIAGNOSIS — M0589 Other rheumatoid arthritis with rheumatoid factor of multiple sites: Secondary | ICD-10-CM | POA: Diagnosis not present

## 2016-03-27 DIAGNOSIS — I890 Lymphedema, not elsewhere classified: Secondary | ICD-10-CM | POA: Diagnosis not present

## 2016-03-28 ENCOUNTER — Encounter: Payer: Self-pay | Admitting: Internal Medicine

## 2016-03-29 ENCOUNTER — Other Ambulatory Visit: Payer: Self-pay | Admitting: Family Medicine

## 2016-04-01 DIAGNOSIS — Z87891 Personal history of nicotine dependence: Secondary | ICD-10-CM | POA: Diagnosis not present

## 2016-04-01 DIAGNOSIS — F319 Bipolar disorder, unspecified: Secondary | ICD-10-CM | POA: Diagnosis not present

## 2016-04-01 DIAGNOSIS — I872 Venous insufficiency (chronic) (peripheral): Secondary | ICD-10-CM | POA: Diagnosis not present

## 2016-04-01 DIAGNOSIS — F209 Schizophrenia, unspecified: Secondary | ICD-10-CM | POA: Diagnosis not present

## 2016-04-01 DIAGNOSIS — I890 Lymphedema, not elsewhere classified: Secondary | ICD-10-CM | POA: Diagnosis not present

## 2016-04-01 DIAGNOSIS — G603 Idiopathic progressive neuropathy: Secondary | ICD-10-CM | POA: Diagnosis not present

## 2016-04-01 DIAGNOSIS — D090 Carcinoma in situ of bladder: Secondary | ICD-10-CM | POA: Diagnosis not present

## 2016-04-01 DIAGNOSIS — L97821 Non-pressure chronic ulcer of other part of left lower leg limited to breakdown of skin: Secondary | ICD-10-CM | POA: Diagnosis not present

## 2016-04-01 DIAGNOSIS — R413 Other amnesia: Secondary | ICD-10-CM | POA: Diagnosis not present

## 2016-04-03 ENCOUNTER — Ambulatory Visit: Payer: Medicare Other | Admitting: Family Medicine

## 2016-04-03 ENCOUNTER — Ambulatory Visit (INDEPENDENT_AMBULATORY_CARE_PROVIDER_SITE_OTHER): Payer: Medicare Other | Admitting: Internal Medicine

## 2016-04-03 ENCOUNTER — Encounter: Payer: Self-pay | Admitting: Internal Medicine

## 2016-04-03 VITALS — BP 130/58 | HR 80 | Temp 98.0°F | Resp 16 | Ht 61.0 in | Wt 210.5 lb

## 2016-04-03 DIAGNOSIS — Z23 Encounter for immunization: Secondary | ICD-10-CM | POA: Diagnosis not present

## 2016-04-03 DIAGNOSIS — I82412 Acute embolism and thrombosis of left femoral vein: Secondary | ICD-10-CM

## 2016-04-03 DIAGNOSIS — R399 Unspecified symptoms and signs involving the genitourinary system: Secondary | ICD-10-CM

## 2016-04-03 DIAGNOSIS — N390 Urinary tract infection, site not specified: Secondary | ICD-10-CM | POA: Diagnosis not present

## 2016-04-03 DIAGNOSIS — R82998 Other abnormal findings in urine: Secondary | ICD-10-CM

## 2016-04-03 DIAGNOSIS — R319 Hematuria, unspecified: Secondary | ICD-10-CM

## 2016-04-03 LAB — POC URINALSYSI DIPSTICK (AUTOMATED)
BILIRUBIN UA: NEGATIVE
Glucose, UA: NEGATIVE
Ketones, UA: NEGATIVE
NITRITE UA: NEGATIVE
PH UA: 5
Spec Grav, UA: >=1.03
UROBILINOGEN UA: 1

## 2016-04-03 MED ORDER — RIVAROXABAN 20 MG PO TABS: 20.0000 mg | ORAL_TABLET | Freq: Every day | ORAL | 1 refills | Status: DC

## 2016-04-03 NOTE — Progress Notes (Signed)
Pre visit review using our clinic review tool, if applicable. No additional management support is needed unless otherwise documented below in the visit note. 

## 2016-04-03 NOTE — Progress Notes (Signed)
Subjective:    Patient ID: Shaun Yoder, male    DOB: March 14, 1946, 70 y.o.   MRN: [id number]  DOS:  04/03/2016 Type of visit - description : f/u Interval history:  Was seen in this office 03/20/2016 with left calf swelling and pain at the left tight-groin. Ultrasound showed a DVT, he is now anticoagulated.  1. Partially occlusive DVT, mid and distal left femoral vein. These results will be called to the ordering clinician or representative by the Radiologist Assistant, and communication documented in the PACS or zVision Dashboard.  Review of Systems Swelling has decrease, pain is about the same. No chest pain, DOE at baseline, able to walk 30 feet without having to stop. No cough No blood in the stools, urine is dark but nonbloody.   Past Medical History:  Diagnosis Date  . Anemia   . Cancer ([place])    bladder  . Colitis   . Colon polyps   . Depression   . Diverticulosis   . Gastritis   . GERD (gastroesophageal reflux disease)   . History of rectal polyps   . Hyperlipidemia   . Hypertension   . Iron deficiency   . Low back pain   . OA (osteoarthritis)   . Schizoaffective disorder, bipolar type ([place])   . Sleep apnea   . Varicose vein     Past Surgical History:  Procedure Laterality Date  . BLADDER SURGERY    . TOTAL KNEE ARTHROPLASTY Bilateral   . VARICOSE VEIN SURGERY      Social History   Social History  . Marital status: Married    Spouse name: N/A  . Number of children: 2  . Years of education: N/A   Occupational History  . retired    Social History Main Topics  . Smoking status: Former Smoker    Packs/day: 2.50    Years: 43.00    Types: Cigarettes    Quit date: 07/22/1998  . Smokeless tobacco: Never Used  . Alcohol use 0.0 oz/week     Comment: Occasional glass of wine  . Drug use: No  . Sexual activity: Not on file   Other Topics Concern  . Not on file   Social History Narrative   Originally from [place]. Moved to [facility] in July 22, 1985.  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 No known asbestos exposure. 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 No international travel. Has a dog currently. Remote exposure to Cockatiel in a different home. No mold exposure. No hot tub exposure.       Two adopted children        Medication List       Accurate as of 04/03/16  2:16 PM. Always use your most recent med list.          Abatacept 125 MG/ML Sosy Inject into the skin once a week. Reported on 10/24/2015   ARIPiprazole 30 MG tablet Commonly known as:  ABILIFY Take 30 mg by mouth at bedtime.   aspirin 81 MG tablet Take 81 mg by mouth daily.   budesonide-formoterol 80-4.5 MCG/ACT inhaler Commonly known as:  SYMBICORT Inhale 2 puffs into the lungs 2 (two) times daily.   CALCIUM 500/D 500-200 MG-UNIT tablet Generic drug:  calcium-vitamin D Take by mouth.   calcium carbonate 600 MG Tabs tablet Commonly known as:  OS-CAL Take 1,200 mg by mouth daily.   divalproex 500 MG 24 hr tablet Commonly known as:  DEPAKOTE ER 4 tabs at bedtime   DOCOSAHEXAENOIC ACID PO Take 1 tablet by mouth daily.   doxycycline 100 MG tablet Commonly known as:  VIBRA-TABS Take 100 mg by mouth 2 (two) times daily. for 10 days   folic acid 1 MG tablet Commonly known as:  FOLVITE Take 1 mg by mouth daily.   gabapentin 300 MG capsule Commonly known as:  NEURONTIN Take 300 mg by mouth 2 (two) times daily.   meloxicam 15 MG tablet Commonly known as:  MOBIC Take 15 mg by mouth daily.   methotrexate 2.5 MG tablet Commonly known as:  RHEUMATREX Take 25 mg by mouth once a week. Reported on 12/06/2015   metoprolol succinate 25 MG 24 hr tablet Commonly known as:  TOPROL-XL Take 12.5 mg by mouth 2 (two) times daily.   mirabegron ER 50 MG Tb24 tablet Commonly known as:  MYRBETRIQ Take 50  mg by mouth daily.   Omega-3 1000 MG Caps Take by mouth.   omeprazole 20 MG capsule Commonly known as:  PRILOSEC Take 20 mg by mouth every morning.   oxybutynin 5 MG tablet Commonly known as:  DITROPAN Take 5 mg by mouth 2 (two) times daily.   predniSONE 10 MG tablet Commonly known as:  DELTASONE 4 tablets x 2 days, 3 tabs x 2 days, 2 tabs x 2 days, 1 tab x 2 days   ranitidine 150 MG tablet Commonly known as:  ZANTAC Take 1 tablet (150 mg total) by mouth at bedtime.   Rivaroxaban 15 & 20 MG Tbpk Take as directed on package: Start with one 15mg  tablet by mouth twice a day w food. On Day 22, switch to one 20mg  tablet once a day w food.   vitamin C 500 MG tablet Commonly known as:  ASCORBIC ACID Take 500 mg by mouth daily.   ZINC PO Take by mouth daily.   ziprasidone 60 MG capsule Commonly known as:  GEODON 1 tablet every AM and PM and 2 at bedtime          Objective:   Physical Exam BP (!) 130/58 (BP Location: Right Arm, Patient Position: Sitting, Cuff Size: Normal)   Pulse 80   Temp 98 F (36.7 C) (Oral)   Resp 16   Ht 5\' 1"  (1.549 m)   Wt 210 lb 8 oz (95.5 kg)   SpO2 (!) 89%   BMI 39.77 kg/m   General:   Well developed, chronically ill appearing, in no acute distress.[name]  HEENT:  Normocephalic . Face symmetric, atraumatic Lungs:  CTA B Normal respiratory effort, no intercostal retractions, no accessory muscle use. Heart: RRR,  significant systolic murmur.  Lower extremities: Covered from the knees to the ankles. Thighs  Symmetric, no TTP. + Varicose veins L thigh , unchanged for years according to the patient, no phlebitis Neurologic:  alert & oriented X3.  Speech normal, gait extremely limited by MSK issues. He seems to be at baseline. Psych--  Cognition and judgment appear intact.  Cooperative with normal attention span and concentration.  Behavior appropriate. No anxious or depressed appearing.    Assessment & Plan:   Assessment   (new pt,  02-2015) Hypertension Hyperlipidemia Ao stenosis, severe: Dr Burt Knack MSK: --Seronegative Rheumatoid arthritis-- Dr Lenna Gilford  --DJD-- DR Tamera Punt  --low back pain d/t DJD, Dr Sherlyn Lick  Schizophrenia, bipolar, depression: Follow-up at the [place] in [facility] GU:  Bladder cancer-transitional cell, Dr. Shona Needles GI:   GERD, history of colitis, history of gastritis,  colon polyps- had a virtual cscope 2016, + polyps, declined further eval Pulmomany: Dr Ashok Cordia --Sleep apnea-- Cpap intolerant --CT nodules f/u 2 pulmonary L LEG DVT 02-2016 Varicose veins: s/p remote surgery, has LE wounds  H/o  iron deficiency anemia  PLAN  DVT, L leg: No triggering event although he is very sedentary Currently anticoagulating, aspirin and meloxicam were stopped. Will continue with Xarelto and reassess in 2  Months (OV, recheck [place]). To call if chest pain, DOE worsens, increased swelling. Watch for bleeding. Check a UA as the urine is getting dark. Gait disorder and frequent falls:re- emphasized prevention. He knows is now anticoagulated and higher risk of injuries. O2 sat --when he arrived to the office 89, I rechecked it several times and it ranged from 92-95%, at baseline. Needs a letter stating he is unable to travel. Provided. RTC 2 months.  Today, I spent more than   28 min with the patient: >50% of the time counseling regards the new diagnosis of DVT, reviewing the chart and results ordered by other providers, discussing the importance of fall prevention and coordinating his care including writing a letter for the patient.

## 2016-04-03 NOTE — Patient Instructions (Signed)
GO TO THE LAB : provide a  urine sample   GO TO THE FRONT DESK Schedule your next appointment for a  checkup in 2 months   Take Xarelto as prescribed  Watch for bleeding. If you have any change in the color of stools less know. Also if you have a headache, stomach pain.  Anterior with leg elevation

## 2016-04-04 DIAGNOSIS — I82402 Acute embolism and thrombosis of unspecified deep veins of left lower extremity: Secondary | ICD-10-CM | POA: Insufficient documentation

## 2016-04-04 LAB — URINALYSIS, ROUTINE W REFLEX MICROSCOPIC
BILIRUBIN URINE: NEGATIVE
Leukocytes, UA: NEGATIVE
NITRITE: NEGATIVE
PH: 5.5 (ref 5.0–8.0)
SPECIFIC GRAVITY, URINE: 1.025 (ref 1.000–1.030)
Total Protein, Urine: 100 — AB
UROBILINOGEN UA: 2 — AB (ref 0.0–1.0)
Urine Glucose: NEGATIVE

## 2016-04-04 NOTE — Assessment & Plan Note (Signed)
DVT, L leg: No triggering event although he is very sedentary Currently anticoagulating, aspirin and meloxicam were stopped. Will continue with Xarelto and reassess in 2  Months (OV, recheck [place]). To call if chest pain, DOE worsens, increased swelling. Watch for bleeding. Check a UA as the urine is getting dark. Gait disorder and frequent falls:re- emphasized prevention. He knows is now anticoagulated and higher risk of injuries. O2 sat --when he arrived to the office 89, I rechecked it several times and it ranged from 92-95%, at baseline. Needs a letter stating he is unable to travel. Provided. RTC 2 months.

## 2016-04-05 ENCOUNTER — Telehealth: Payer: Self-pay | Admitting: Internal Medicine

## 2016-04-05 DIAGNOSIS — R319 Hematuria, unspecified: Secondary | ICD-10-CM

## 2016-04-05 DIAGNOSIS — I82409 Acute embolism and thrombosis of unspecified deep veins of unspecified lower extremity: Secondary | ICD-10-CM

## 2016-04-05 LAB — URINE CULTURE

## 2016-04-05 MED ORDER — RIVAROXABAN 20 MG PO TABS: 20.0000 mg | ORAL_TABLET | Freq: Every day | ORAL | 1 refills | Status: DC

## 2016-04-05 NOTE — Telephone Encounter (Signed)
Relation to [id number] Call back number:[phone] [id number] [place], [place] [phone] (H)  Reason for call:  As per patient pharmacy never received  rivaroxaban (XARELTO) 20 MG TABS tablet. Patient iquiring about urine results patient states he's worried because there was blood in hes urine. Please advise

## 2016-04-05 NOTE — Telephone Encounter (Signed)
Advise patient: I am also concerned about the hematuria, I'm waiting for the urine culture to see if he needs antibiotics. Please go ahead and refer to urology, DX hematuria. Call anytime if he has fever, chills, difficulty urinating, pain on the sides or the lower abdomen.

## 2016-04-05 NOTE — Telephone Encounter (Signed)
Xarelto sent to pharmacy. Please advise regarding urine results.

## 2016-04-05 NOTE — Telephone Encounter (Signed)
Spoke w/ Shaun Yoder, informed him that [facility]. Informed him that we are still waiting for culture on urine but PCP recommended to see Urology regarding hematuria. Shaun Yoder informed me that he see's Dr. Diona Fanti at [facility]. Instructed Shaun Yoder we would place referral and he will receive call and that once we receive urine culture results we would let him know. Shaun Yoder verbalized understanding.

## 2016-04-07 ENCOUNTER — Other Ambulatory Visit: Payer: Self-pay | Admitting: Internal Medicine

## 2016-04-07 MED ORDER — CIPROFLOXACIN HCL 500 MG PO TABS: 500.0000 mg | ORAL_TABLET | Freq: Two times a day (BID) | ORAL | 0 refills | Status: DC

## 2016-04-08 NOTE — Telephone Encounter (Signed)
UCX was +, on Cipro. See report

## 2016-04-15 DIAGNOSIS — R413 Other amnesia: Secondary | ICD-10-CM | POA: Diagnosis not present

## 2016-04-15 DIAGNOSIS — F209 Schizophrenia, unspecified: Secondary | ICD-10-CM | POA: Diagnosis not present

## 2016-04-15 DIAGNOSIS — M1990 Unspecified osteoarthritis, unspecified site: Secondary | ICD-10-CM | POA: Diagnosis not present

## 2016-04-15 DIAGNOSIS — Z87891 Personal history of nicotine dependence: Secondary | ICD-10-CM | POA: Diagnosis not present

## 2016-04-15 DIAGNOSIS — I872 Venous insufficiency (chronic) (peripheral): Secondary | ICD-10-CM | POA: Diagnosis not present

## 2016-04-15 DIAGNOSIS — I890 Lymphedema, not elsewhere classified: Secondary | ICD-10-CM | POA: Diagnosis not present

## 2016-04-15 DIAGNOSIS — F319 Bipolar disorder, unspecified: Secondary | ICD-10-CM | POA: Diagnosis not present

## 2016-04-15 DIAGNOSIS — L97821 Non-pressure chronic ulcer of other part of left lower leg limited to breakdown of skin: Secondary | ICD-10-CM | POA: Diagnosis not present

## 2016-04-17 DIAGNOSIS — R310 Gross hematuria: Secondary | ICD-10-CM | POA: Diagnosis not present

## 2016-04-29 DIAGNOSIS — Z8551 Personal history of malignant neoplasm of bladder: Secondary | ICD-10-CM | POA: Diagnosis not present

## 2016-04-29 DIAGNOSIS — R310 Gross hematuria: Secondary | ICD-10-CM | POA: Diagnosis not present

## 2016-06-03 ENCOUNTER — Ambulatory Visit (INDEPENDENT_AMBULATORY_CARE_PROVIDER_SITE_OTHER): Payer: Medicare Other | Admitting: Internal Medicine

## 2016-06-03 ENCOUNTER — Encounter: Payer: Self-pay | Admitting: Internal Medicine

## 2016-06-03 ENCOUNTER — Ambulatory Visit (HOSPITAL_BASED_OUTPATIENT_CLINIC_OR_DEPARTMENT_OTHER)
Admission: RE | Admit: 2016-06-03 | Discharge: 2016-06-03 | Disposition: A | Discharge status: Home / Self Care | Payer: Medicare Other | Source: Ambulatory Visit | Attending: Internal Medicine | Admitting: Internal Medicine

## 2016-06-03 VITALS — BP 126/78 | HR 44 | Temp 98.5°F | Resp 18 | Ht 61.0 in | Wt 213.5 lb

## 2016-06-03 DIAGNOSIS — I83009 Varicose veins of unspecified lower extremity with ulcer of unspecified site: Secondary | ICD-10-CM | POA: Diagnosis not present

## 2016-06-03 DIAGNOSIS — I10 Essential (primary) hypertension: Secondary | ICD-10-CM | POA: Diagnosis not present

## 2016-06-03 DIAGNOSIS — I82412 Acute embolism and thrombosis of left femoral vein: Secondary | ICD-10-CM | POA: Diagnosis not present

## 2016-06-03 DIAGNOSIS — R269 Unspecified abnormalities of gait and mobility: Secondary | ICD-10-CM | POA: Insufficient documentation

## 2016-06-03 DIAGNOSIS — M25552 Pain in left hip: Secondary | ICD-10-CM | POA: Insufficient documentation

## 2016-06-03 DIAGNOSIS — M1612 Unilateral primary osteoarthritis, left hip: Secondary | ICD-10-CM | POA: Diagnosis not present

## 2016-06-03 DIAGNOSIS — M160 Bilateral primary osteoarthritis of hip: Secondary | ICD-10-CM | POA: Diagnosis not present

## 2016-06-03 DIAGNOSIS — L97909 Non-pressure chronic ulcer of unspecified part of unspecified lower leg with unspecified severity: Secondary | ICD-10-CM

## 2016-06-03 NOTE — Patient Instructions (Addendum)
Get your x-rays downstairs  We are scheduling a ultrasound  Continue taking the same medications except metoprolol  Tylenol as needed for pain  Next visit in 4 months    Fall Prevention in the [place] can cause injuries and can affect people from all age groups. There are many simple things that you can do to make your home safe and to help prevent falls. WHAT CAN I DO ON THE OUTSIDE OF MY HOME?  Regularly repair the edges of walkways and driveways and fix any cracks.  Remove high doorway thresholds.  Trim any shrubbery on the main path into your home.  Use bright outdoor lighting.  Clear walkways of debris and clutter, including tools and rocks.  Regularly check that handrails are securely fastened and in good repair. Both sides of any steps should have handrails.  Install guardrails along the edges of any raised decks or porches.  Have leaves, snow, and ice cleared regularly.  Use sand or salt on walkways during winter months.  In the garage, clean up any spills right away, including grease or oil spills. WHAT CAN I DO IN THE BATHROOM?  Use night lights.  Install grab bars by the toilet and in the tub and shower. Do not use towel bars as grab bars.  Use non-skid mats or decals on the floor of the tub or shower.  If you need to sit down while you are in the shower, use a plastic, non-slip stool.[name]  Keep the floor dry. Immediately clean up any water that spills on the floor.  Remove soap buildup in the tub or shower on a regular basis.  Attach bath mats securely with double-sided non-slip rug tape.  Remove throw rugs and other tripping hazards from the floor. WHAT CAN I DO IN THE BEDROOM?  Use night lights.  Make sure that a bedside light is easy to reach.  Do not use oversized bedding that drapes onto the floor.  Have a firm chair that has side arms to use for getting dressed.  Remove throw rugs and other tripping hazards from the floor. WHAT CAN I  DO IN THE KITCHEN?   Clean up any spills right away.  Avoid walking on wet floors.  Place frequently used items in easy-to-reach places.  If you need to reach for something above you, use a sturdy step stool that has a grab bar.  Keep electrical cables out of the way.  Do not use floor polish or wax that makes floors slippery. If you have to use wax, make sure that it is non-skid floor wax.  Remove throw rugs and other tripping hazards from the floor. WHAT CAN I DO IN THE STAIRWAYS?  Do not leave any items on the stairs.  Make sure that there are handrails on both sides of the stairs. Fix handrails that are broken or loose. Make sure that handrails are as long as the stairways.  Check any carpeting to make sure that it is firmly attached to the stairs. Fix any carpet that is loose or worn.  Avoid having throw rugs at the top or bottom of stairways, or secure the rugs with carpet tape to prevent them from moving.  Make sure that you have a light switch at the top of the stairs and the bottom of the stairs. If you do not have them, have them installed. WHAT ARE SOME OTHER FALL PREVENTION TIPS?  Wear closed-toe shoes that fit well and support your feet. Wear shoes that  have rubber soles or low heels.  When you use a stepladder, make sure that it is completely opened and that the sides are firmly locked. Have someone hold the ladder while you are using it. Do not climb a closed stepladder.  Add color or contrast paint or tape to grab bars and handrails in your home. Place contrasting color strips on the first and last steps.  Use mobility aids as needed, such as canes, walkers, scooters, and crutches.  Turn on lights if it is dark. Replace any light bulbs that burn out.  Set up furniture so that there are clear paths. Keep the furniture in the same spot.  Fix any uneven floor surfaces.  Choose a carpet design that does not hide the edge of steps of a stairway.  Be aware of any  and all pets.  Review your medicines with your healthcare provider. Some medicines can cause dizziness or changes in blood pressure, which increase your risk of falling. Talk with your health care provider about other ways that you can decrease your risk of falls. This may include working with a physical therapist or trainer to improve your strength, balance, and endurance.   This information is not intended to replace advice given to you by your health care provider. Make sure you discuss any questions you have with your health care provider.   Document Released: 06/28/2002 Document Revised: 11/22/2014 Document Reviewed: 08/12/2014 Elsevier Interactive Patient Education [employer].

## 2016-06-03 NOTE — Progress Notes (Signed)
Pre visit review using our clinic review tool, if applicable. No additional management support is needed unless otherwise documented below in the visit note. 

## 2016-06-03 NOTE — Progress Notes (Signed)
Subjective:    Patient ID: Shaun Yoder, male    DOB: 04/20/1946, 70 y.o.   MRN: [id number]  DOS:  06/03/2016 Type of visit - description : Follow-up, here with his wife Interval history: Here for follow-up regarding his left leg DVT. Good compliance with anticoagulation. His main concern today is pain at the left groin, it is constant, more when he walks or try to put weight on the left leg. Also, he had open sores in the lower extremities, they finally close after multiple treatments by the [facility]   Review of Systems  When asked, admits to around 3 falls in the last couple of months, usually soft landings. Has pain at different places including the left hip but there is not clear relationship between acute pain follow up falls. No headaches. Denies chest pain; difficulty breathing and DOE at baseline.  Past Medical History:  Diagnosis Date  . Anemia   . Cancer ([place])    bladder  . Colitis   . Colon polyps   . Depression   . Diverticulosis   . Gastritis   . GERD (gastroesophageal reflux disease)   . History of rectal polyps   . Hyperlipidemia   . Hypertension   . Iron deficiency   . Low back pain   . OA (osteoarthritis)   . Schizoaffective disorder, bipolar type ([place])   . Sleep apnea   . Varicose vein     Past Surgical History:  Procedure Laterality Date  . BLADDER SURGERY    . TOTAL KNEE ARTHROPLASTY Bilateral   . VARICOSE VEIN SURGERY      Social History   Social History  . Marital status: Married    Spouse name: N/A  . Number of children: 2  . Years of education: N/A   Occupational History  . retired    Social History Main Topics  . Smoking status: Former Smoker    Packs/day: 2.50    Years: 43.00    Types: Cigarettes    Quit date: 07/22/1998  . Smokeless tobacco: Never Used  . Alcohol use 0.0 oz/week     Comment: Occasional glass of wine  . Drug use: No  . Sexual activity: Not on file   Other Topics Concern  . Not on file   Social History  Narrative   Originally from [place]. Moved to [place] in July 22, 1985. 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 No known asbestos exposure. 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 No international travel. Has a dog currently. Remote exposure to Cockatiel in a different home. No mold exposure. No hot tub exposure.       Two adopted children        Medication List       Accurate as of 06/03/16  2:10 PM. Always use your most recent med list.          Abatacept 125 MG/ML Sosy Inject into the skin once a week. Reported on 10/24/2015   ARIPiprazole 30 MG tablet Commonly known as:  ABILIFY Take 30 mg by mouth at bedtime.   budesonide-formoterol 80-4.5 MCG/ACT inhaler Commonly known as:  SYMBICORT Inhale 2 puffs into the lungs 2 (two) times daily.   CALCIUM 500/D 500-200 MG-UNIT tablet Generic drug:  calcium-vitamin D Take by mouth.   calcium carbonate 600 MG Tabs tablet Commonly known  as:  OS-CAL Take 1,200 mg by mouth daily.   ciprofloxacin 500 MG tablet Commonly known as:  CIPRO Take 1 tablet (500 mg total) by mouth 2 (two) times daily.   divalproex 500 MG 24 hr tablet Commonly known as:  DEPAKOTE ER 4 tabs at bedtime   DOCOSAHEXAENOIC ACID PO Take 1 tablet by mouth daily.   folic acid 1 MG tablet Commonly known as:  FOLVITE Take 1 mg by mouth daily.   gabapentin 300 MG capsule Commonly known as:  NEURONTIN Take 300 mg by mouth 2 (two) times daily.   methotrexate 2.5 MG tablet Commonly known as:  RHEUMATREX Take 25 mg by mouth once a week. Reported on 12/06/2015   metoprolol succinate 25 MG 24 hr tablet Commonly known as:  TOPROL-XL Take 12.5 mg by mouth 2 (two) times daily.   mirabegron ER 50 MG Tb24 tablet Commonly known as:  MYRBETRIQ Take 50 mg by mouth daily.   Omega-3 1000 MG Caps Take by mouth.     omeprazole 20 MG capsule Commonly known as:  PRILOSEC Take 20 mg by mouth every morning.   oxybutynin 5 MG tablet Commonly known as:  DITROPAN Take 5 mg by mouth 2 (two) times daily.   ranitidine 150 MG tablet Commonly known as:  ZANTAC Take 1 tablet (150 mg total) by mouth at bedtime.   rivaroxaban 20 MG Tabs tablet Commonly known as:  XARELTO Take 1 tablet (20 mg total) by mouth daily with supper.   vitamin C 500 MG tablet Commonly known as:  ASCORBIC ACID Take 500 mg by mouth daily.   ZINC PO Take by mouth daily.   ziprasidone 60 MG capsule Commonly known as:  GEODON 1 tablet every AM and PM and 2 at bedtime          Objective:   Physical Exam BP 126/78 (BP Location: Left Arm, Patient Position: Sitting, Cuff Size: Normal)   Pulse (!) 44   Temp 98.5 F (36.9 C) (Oral)   Resp 18   Ht 5\' 1"  (1.549 m)   Wt 213 lb 8 oz (96.8 kg)   SpO2 92%   BMI 40.34 kg/m  General:   Well developed, well nourished   HEENT:  Normocephalic . Face symmetric, atraumatic Lungs:  Decreased breath sounds at bases Normal respiratory effort, no intercostal retractions, no accessory muscle use. Heart: Bradycardic, significant systolic murmur.  No pretibial edema bilaterally  Lower extremity: Wrapped on a compression stocking, they seem symmetric and soft. MSK:  Gait severely limited by spine problems, unable to stand up straight d/t sever kyphosis . Unable to get on the table for exam. The patient was sitting down, his passive R hip motility mobility was normal. Left hip mobility quite limited. Passive rotation triggered severe pain. Neurologic:  alert & oriented X3.  Speech normal  Psych--  Cognition and judgment appear intact.  Cooperative with normal attention span and concentration.  Behavior appropriate. No anxious or depressed appearing.      Assessment & Plan:   Assessment   (new pt, transfer from [facility],02-2015) Hypertension Hyperlipidemia Ao stenosis, severe: Dr  Burt Knack MSK: --Seronegative Rheumatoid arthritis-- Dr Lenna Gilford  --DJD-- DR Tamera Punt  --low back pain d/t DJD, Dr Sherlyn Lick  Schizophrenia, bipolar, depression: Follow-up at the [place] in [facility] GU: Bladder cancer-transitional cell, Dr. Shona Needles GI:   GERD, history of colitis, history of gastritis,  colon polyps- had a virtual cscope 2016, + polyps, declined further eval Pulmomany: Dr Ashok Cordia --Sleep apnea-- Cpap intolerant --  CT nodules f/u 2 pulmonary L LEG DVT 02-2016 Varicose veins: s/p remote surgery, has LE wounds  H/o  iron deficiency anemia  PLAN  DVT, left leg: Continue anticoagulation for now, get ultrasound. Further advise with results Gait disorder, frequent falls: Prevention discussed again Varicose veins, lower extremity wounds: s/p  extensive eval at the [facility] status post ABX, I did not unwrap his leg but he states these sores are gone. HTN: BP is okay but heart rate very low, recommend to decrease metoprolol 12.5 mg XL to half tablet once daily only. Monitor BPs, will call me if BP increases. Left groin pain: Pain since he was diagnosed with a left leg DVT,  sx likely due to hip DJD. He has frequent falls consequently we x-ray to rule out a fracture. Further advise with results understanding that his poor candidate for many of the available interventions. RTC 4 months.

## 2016-06-03 NOTE — Assessment & Plan Note (Signed)
DVT, left leg: Continue anticoagulation for now, get ultrasound. Further advise with results Gait disorder, frequent falls: Prevention discussed again Varicose veins, lower extremity wounds: s/p  extensive eval at the [facility] status post ABX, I did not unwrap his leg but he states these sores are gone. HTN: BP is okay but heart rate very low, recommend to decrease metoprolol 12.5 mg XL to half tablet once daily only. Monitor BPs, will call me if BP increases. Left groin pain: Pain since he was diagnosed with a left leg DVT,  sx likely due to hip DJD. He has frequent falls consequently we x-ray to rule out a fracture. Further advise with results understanding that his poor candidate for many of the available interventions. RTC 4 months.

## 2016-06-04 DIAGNOSIS — B351 Tinea unguium: Secondary | ICD-10-CM | POA: Diagnosis not present

## 2016-06-04 DIAGNOSIS — L03031 Cellulitis of right toe: Secondary | ICD-10-CM | POA: Diagnosis not present

## 2016-06-07 ENCOUNTER — Ambulatory Visit (HOSPITAL_BASED_OUTPATIENT_CLINIC_OR_DEPARTMENT_OTHER)
Admission: RE | Admit: 2016-06-07 | Discharge: 2016-06-07 | Disposition: A | Discharge status: Home / Self Care | Payer: Medicare Other | Source: Ambulatory Visit | Attending: Internal Medicine | Admitting: Internal Medicine

## 2016-06-07 DIAGNOSIS — I82412 Acute embolism and thrombosis of left femoral vein: Secondary | ICD-10-CM | POA: Insufficient documentation

## 2016-06-07 DIAGNOSIS — R609 Edema, unspecified: Secondary | ICD-10-CM | POA: Insufficient documentation

## 2016-06-12 NOTE — Addendum Note (Signed)
Addended byDamita Dunnings D on: 06/12/2016 02:24 PM   Modules accepted: Orders

## 2016-06-18 DIAGNOSIS — M79671 Pain in right foot: Secondary | ICD-10-CM | POA: Diagnosis not present

## 2016-06-18 DIAGNOSIS — B351 Tinea unguium: Secondary | ICD-10-CM | POA: Diagnosis not present

## 2016-06-18 DIAGNOSIS — M79672 Pain in left foot: Secondary | ICD-10-CM | POA: Diagnosis not present

## 2016-06-18 DIAGNOSIS — Z5181 Encounter for therapeutic drug level monitoring: Secondary | ICD-10-CM | POA: Diagnosis not present

## 2016-06-20 ENCOUNTER — Ambulatory Visit: Payer: Medicare Other

## 2016-06-20 ENCOUNTER — Ambulatory Visit (HOSPITAL_BASED_OUTPATIENT_CLINIC_OR_DEPARTMENT_OTHER): Payer: Medicare Other | Admitting: Hematology & Oncology

## 2016-06-20 ENCOUNTER — Other Ambulatory Visit: Payer: Medicare Other

## 2016-06-20 ENCOUNTER — Other Ambulatory Visit (HOSPITAL_BASED_OUTPATIENT_CLINIC_OR_DEPARTMENT_OTHER): Payer: Medicare Other

## 2016-06-20 VITALS — BP 117/75 | HR 63 | Temp 98.4°F | Resp 20 | Wt 207.8 lb

## 2016-06-20 DIAGNOSIS — I82422 Acute embolism and thrombosis of left iliac vein: Secondary | ICD-10-CM

## 2016-06-20 DIAGNOSIS — I82412 Acute embolism and thrombosis of left femoral vein: Secondary | ICD-10-CM

## 2016-06-20 DIAGNOSIS — M069 Rheumatoid arthritis, unspecified: Secondary | ICD-10-CM | POA: Diagnosis not present

## 2016-06-20 DIAGNOSIS — I350 Nonrheumatic aortic (valve) stenosis: Secondary | ICD-10-CM

## 2016-06-20 LAB — CBC WITH DIFFERENTIAL (CANCER CENTER ONLY)
BASO#: 0 10*3/uL (ref 0.0–0.2)
BASO%: 0.1 % (ref 0.0–2.0)
EOS ABS: 0 10*3/uL (ref 0.0–0.5)
EOS%: 0.4 % (ref 0.0–7.0)
HEMATOCRIT: 43.7 % (ref 38.7–49.9)
HEMOGLOBIN: 15.2 g/dL (ref 13.0–17.1)
LYMPH#: 1.8 10*3/uL (ref 0.9–3.3)
LYMPH%: 26.8 % (ref 14.0–48.0)
MCH: 32.5 pg (ref 28.0–33.4)
MCHC: 34.8 g/dL (ref 32.0–35.9)
MCV: 93 fL (ref 82–98)
MONO#: 0.7 10*3/uL (ref 0.1–0.9)
MONO%: 11 % (ref 0.0–13.0)
NEUT%: 61.7 % (ref 40.0–80.0)
NEUTROS ABS: 4.1 10*3/uL (ref 1.5–6.5)
Platelets: 140 10*3/uL — ABNORMAL LOW (ref 145–400)
RBC: 4.68 10*6/uL (ref 4.20–5.70)
RDW: 14 % (ref 11.1–15.7)
WBC: 6.7 10*3/uL (ref 4.0–10.0)

## 2016-06-20 LAB — COMPREHENSIVE METABOLIC PANEL (CC13)
A/G RATIO: 1.1 — AB (ref 1.2–2.2)
ALBUMIN: 3.7 g/dL (ref 3.5–4.8)
ALT: 10 IU/L (ref 0–44)
AST (SGOT): 21 IU/L (ref 0–40)
Alkaline Phosphatase, S: 90 IU/L (ref 39–117)
BILIRUBIN TOTAL: 0.5 mg/dL (ref 0.0–1.2)
BUN / CREAT RATIO: 39 — AB (ref 10–24)
BUN: 24 mg/dL (ref 8–27)
CHLORIDE: 100 mmol/L (ref 96–106)
Calcium, Ser: 9.3 mg/dL (ref 8.6–10.2)
Carbon Dioxide, Total: 31 mmol/L — ABNORMAL HIGH (ref 18–29)
Creatinine, Ser: 0.61 mg/dL — ABNORMAL LOW (ref 0.76–1.27)
GFR calc non Af Amer: 101 mL/min/{1.73_m2} (ref >59)
GFR, EST AFRICAN AMERICAN: 117 mL/min/{1.73_m2} (ref >59)
Globulin, Total: 3.4 g/dL (ref 1.5–4.5)
Glucose: 87 mg/dL (ref 65–99)
POTASSIUM: 4.5 mmol/L (ref 3.5–5.2)
Sodium: 137 mmol/L (ref 134–144)
TOTAL PROTEIN: 7.1 g/dL (ref 6.0–8.5)

## 2016-06-20 NOTE — Progress Notes (Signed)
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 Initial Assessment    "Blood Clots"  : I was told that you could help me feel better.  HPI: Mr. Shaun Yoder is a very nice 70 year old white male. He used to be in [employer]  and he was in [place]. He apparently had a psychological break that prevented him from staying in the [employer].  He has a multitude of problems. He comes in an IT trainer wheelchair. He has rheumatoid arthritis. He has osteoarthritis. He has been stasis omentitis of his legs. He has aortic stenosis.  He is on full military disability.  He apparently developed some pain in the left hip in August. He had a Doppler at that time. This showed a partially occlusive thrombus in the mid and distal left femoral vein.  There is nobody in the family with history of blood clots.  He certainly is not that active. He mostly just has to stay home. Because of his bad arthritis and rheumatism, he really is not able to do much for himself. His wife does a lot of of his daily chores and daily hygiene.  He did have hip x-rays back a couple of weeks ago. This showed advanced degenerative changes in the left hip.  He has bad shoulders. He also has aortic stenosis. However, because of his psychiatric issues, he does not want have any surgery.  He is on Xarelto. He seems be doing pretty well with Xarelto. He's had no bleeding.  He has wrappings on his legs because of stasis dermatitis issues.  I think he may see a wound clinic. He gets blisters on his legs that are treated at the wound clinic.  We are kindly asked to see him to see how we might be able to help.  Currently, his performance status is ECOG 3 at best.   Procedure Laterality Date  . BLADDER SURGERY    . TOTAL KNEE ARTHROPLASTY Bilateral   . VARICOSE VEIN SURGERY    :   Current Outpatient Prescriptions:  .  Abatacept 125 MG/ML SOSY,  Inject into the skin once a week. Reported on 10/24/2015, Disp: , Rfl:  .  ARIPiprazole (ABILIFY) 30 MG tablet, Take 30 mg by mouth at bedtime.  , Disp: , Rfl:  .  calcium carbonate (OS-CAL) 600 MG TABS, Take 1,200 mg by mouth daily.  , Disp: , Rfl:  .  calcium-vitamin D (CALCIUM 500/D) 500-200 MG-UNIT tablet, Take by mouth., Disp: , Rfl:  .  divalproex (DEPAKOTE ER) 500 MG 24 hr tablet, 4 tabs at bedtime, Disp: , Rfl:  .  DOCOSAHEXAENOIC ACID PO, Take 1 tablet by mouth daily., Disp: , Rfl:  .  folic acid (FOLVITE) 1 MG tablet, Take 1 mg by mouth daily.  , Disp: , Rfl:  .  gabapentin (NEURONTIN) 300 MG capsule, Take 300 mg by mouth 2 (two) times daily. , Disp: , Rfl:  .  methotrexate (RHEUMATREX) 2.5 MG tablet, Take 17.5 mg by mouth once a week. Reported on 12/06/2015, Disp: , Rfl:  .  metoprolol succinate (TOPROL-XL) 25 MG 24 hr tablet, Take 12.5 mg by mouth daily., Disp: , Rfl:  .  mirabegron ER (MYRBETRIQ) 50 MG TB24 tablet, Take 50 mg by mouth daily., Disp: , Rfl:  .  Multiple Vitamins-Minerals (ZINC PO), Take by mouth daily., Disp: , Rfl:  .  Omega-3 1000 MG CAPS, Take by  mouth., Disp: , Rfl:  .  omeprazole (PRILOSEC) 20 MG capsule, Take 20 mg by mouth every morning. , Disp: , Rfl:  .  oxybutynin (DITROPAN) 5 MG tablet, Take 5 mg by mouth 2 (two) times daily., Disp: , Rfl:  .  rivaroxaban (XARELTO) 20 MG TABS tablet, Take 1 tablet (20 mg total) by mouth daily with supper., Disp: 90 tablet, Rfl: 1 .  vitamin C (ASCORBIC ACID) 500 MG tablet, Take 500 mg by mouth daily., Disp: , Rfl:  .  ziprasidone (GEODON) 60 MG capsule, Take 60 mg by mouth daily. , Disp: , Rfl: :  :  Allergies  Allergen Reactions  . Leflunomide Other (See Comments)    diarrhea  . Morphine And Related Nausea And Vomiting  . Plaquenil [Hydroxychloroquine Sulfate]     rash  :  Family History  Problem Relation Age of Onset  . Heart attack Mother   . Heart attack Father   . Stroke Father   . Rheumatologic disease Neg  Hx   . Colon cancer Neg Hx   . Lung disease Neg Hx   . Prostate cancer Neg Hx   :  Social History   Social History  . Marital status: Married    Spouse name: N/A  . Number of children: 2  . Years of education: N/A   Occupational History  . retired    Social History Main Topics  . Smoking status: Former Smoker    Packs/day: 2.50    Years: 43.00    Types: Cigarettes    Quit date: 07/22/1998  . Smokeless tobacco: Never Used  . Alcohol use 0.0 oz/week     Comment: Occasional glass of wine  . Drug use: No  . Sexual activity: Not on file   Other Topics Concern  . Not on file   Social History Narrative   Originally from [place]. Moved to [place] [place] in July 22, 1985. 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 No known asbestos exposure. 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 No international travel. Has a dog currently. Remote exposure to Cockatiel in a different home. No mold exposure. No hot tub exposure.       Two adopted children  :  Pertinent items are noted in HPI.  Exam: @IPVITALS @  Chronically ill-appearing white male who is and will chair. He has obvious deformities from arthritis. Vital signs show temperature of 98.4. Pulse 63. Blood pressure 116/75. Weight is 208 pounds. Neck exam shows no ocular or oral lesions. There are no palpable cervical or supraclavicular lymph nodes. His lungs are with some slight decrease at the bases. Cardiac exam regular in rhythm. He has a 3/6 systolic ejection murmur consistent with his aortic stenosis. Abdomen is soft. He is moderately obese. There is no fluid wave. There is no palpable liver or spleen tip. Extremities shows wrappings around both legs. He has obvious venous stasis dermatitis changes in both legs below the knees. He has arthritic deformities of his hands. Skin exam shows the  stasis dermatitis changes in his lower legs. I see no ecchymoses. He has no petechia. Neurological exam shows no obvious neurological deficits.   ent Labs  06/20/16 1449  WBC 6.7  HGB 15.2  HCT 43.7  PLT 140*   No results for input(s): NA, K, CL, CO2, GLUCOSE, BUN, CREATININE, CALCIUM in the  last 72 hours.  Blood smear review:  None   Pathology: None     Assessment and Plan:  Mr. Shaun Yoder is a 70 year old white male with multiple, multiple problems. He is on a lot of medications.  He has a chronic nonocclusive thrombus of the left femoral vein.  He had a Doppler done today. This showed minimal improvement.  Unfortunately, I'm not sure what else can be done for this. I don't think that changing anticoagulation agents would make a difference.  I think that he probably is going to need long-term anticoagulation. I am checking a hypercoagulable panel. I would think that this would be negative.   I just feel bad for him. He has all these health issues. He is being seen by the [place]. I think he is being seen by multiple other physicians.  I think what we could probably do is repeat a Doppler in 3 months. I think that this hopefully will show [place] that there might be some improvement.  I don't see that there is any way that he would tolerate any type of surgical intervention for this issue. Again this is not a obstructive blood clot. There is some blood flow through this clot.  I think in the "big picture" that he has problems much bigger than a thrombus.   He is very nice. His wife is very nice. I gave him a prayer blanket. Hopefully he will have some peace with this prayer blanket knowing that people are praying for him.   We'll see him back in 3 months. I will do a Doppler the same day that we see him.   I spent about 45 minutes with he and his wife. Again, they are both very nice.

## 2016-06-22 LAB — D-DIMER, QUANTITATIVE: D-DIMER: 1.6 mg/L FEU — ABNORMAL HIGH (ref 0.00–0.49)

## 2016-06-23 LAB — BETA-2-GLYCOPROTEIN I ABS, IGG/M/A
Beta-2 Glyco 1 IgA: <9 GPI IgA units (ref 0–25)
Beta-2 Glyco 1 IgM: <9 GPI IgM units (ref 0–32)
Beta-2 Glycoprotein I Ab, IgG: <9 GPI IgG units (ref 0–20)

## 2016-06-23 LAB — LUPUS ANTICOAGULANT PANEL
DRVVT MIX: 108.1 s — AB (ref 0.0–47.0)
HEXAGONAL PHASE PHOSPHOLIPID: 27 s — AB (ref 0–11)
PTT-LA Mix: 56.8 s — ABNORMAL HIGH (ref 0.0–48.9)
PTT-LA: 63 s — ABNORMAL HIGH (ref 0.0–51.9)

## 2016-06-23 LAB — ANTITHROMBIN III: Antithrombin Activity: 112 % (ref 75–135)

## 2016-06-23 LAB — PROTEIN S, TOTAL: Protein S, Total: 92 % (ref 60–150)

## 2016-06-23 LAB — PROTEIN S ACTIVITY: Protein S-Functional: 203 % — ABNORMAL HIGH (ref 63–140)

## 2016-06-23 LAB — PROTEIN C ACTIVITY: Protein C-Functional: 118 % (ref 73–180)

## 2016-06-25 ENCOUNTER — Telehealth: Payer: Self-pay | Admitting: Internal Medicine

## 2016-06-25 LAB — PROTEIN C, TOTAL: Protein C Antigen: 78 % (ref 60–150)

## 2016-06-25 LAB — CARDIOLIPIN ANTIBODIES, IGG, IGM, IGA
ANTICARDIOLIPIN IGG: 11 GPL U/mL (ref 0–14)
Anticardiolipin Ab,IgA,Qn: <9 APL U/mL (ref 0–11)
Anticardiolipin Ab,IgM,Qn: <9 MPL U/mL (ref 0–12)

## 2016-06-25 NOTE — Telephone Encounter (Signed)
Wife called stating that patient is having nose bleeds and has a low BP. Transferred to [facility]. Spoke with Lattie Haw.

## 2016-06-26 ENCOUNTER — Telehealth: Payer: Self-pay

## 2016-06-26 LAB — FACTOR 5 LEIDEN

## 2016-06-26 LAB — PROTHROMBIN GENE MUTATION

## 2016-06-26 NOTE — Telephone Encounter (Signed)
Received telephone record from [facility] today. Called patient to follow up. Wife states patient has not had nose bleed today. States BP was 120/82 when Home Health Nurse made house call today. Patient has not has any Shortness of breath, Chest pain or Nosebleeds today. Advised to take [patient to ED or UC if bleeding occurs again.

## 2016-06-27 ENCOUNTER — Encounter: Payer: Self-pay | Admitting: Hematology & Oncology

## 2016-08-19 DIAGNOSIS — R310 Gross hematuria: Secondary | ICD-10-CM | POA: Diagnosis not present

## 2016-08-20 DIAGNOSIS — B351 Tinea unguium: Secondary | ICD-10-CM | POA: Diagnosis not present

## 2016-08-20 DIAGNOSIS — M79671 Pain in right foot: Secondary | ICD-10-CM | POA: Diagnosis not present

## 2016-08-20 DIAGNOSIS — M79672 Pain in left foot: Secondary | ICD-10-CM | POA: Diagnosis not present

## 2016-08-21 DIAGNOSIS — R310 Gross hematuria: Secondary | ICD-10-CM | POA: Diagnosis not present

## 2016-08-21 DIAGNOSIS — C670 Malignant neoplasm of trigone of bladder: Secondary | ICD-10-CM | POA: Diagnosis not present

## 2016-09-12 DIAGNOSIS — Z96653 Presence of artificial knee joint, bilateral: Secondary | ICD-10-CM | POA: Diagnosis not present

## 2016-09-12 DIAGNOSIS — G8929 Other chronic pain: Secondary | ICD-10-CM | POA: Insufficient documentation

## 2016-09-12 DIAGNOSIS — Z471 Aftercare following joint replacement surgery: Secondary | ICD-10-CM | POA: Diagnosis not present

## 2016-09-12 DIAGNOSIS — M25552 Pain in left hip: Secondary | ICD-10-CM

## 2016-09-12 DIAGNOSIS — M8589 Other specified disorders of bone density and structure, multiple sites: Secondary | ICD-10-CM | POA: Diagnosis not present

## 2016-09-12 DIAGNOSIS — M1612 Unilateral primary osteoarthritis, left hip: Secondary | ICD-10-CM | POA: Diagnosis not present

## 2016-09-23 ENCOUNTER — Other Ambulatory Visit (HOSPITAL_BASED_OUTPATIENT_CLINIC_OR_DEPARTMENT_OTHER): Payer: Medicare Other

## 2016-09-23 ENCOUNTER — Ambulatory Visit (HOSPITAL_BASED_OUTPATIENT_CLINIC_OR_DEPARTMENT_OTHER): Payer: Medicare Other | Admitting: Hematology & Oncology

## 2016-09-23 ENCOUNTER — Ambulatory Visit (HOSPITAL_BASED_OUTPATIENT_CLINIC_OR_DEPARTMENT_OTHER)
Admission: RE | Admit: 2016-09-23 | Discharge: 2016-09-23 | Disposition: A | Discharge status: Home / Self Care | Payer: Medicare Other | Source: Ambulatory Visit | Attending: Hematology & Oncology | Admitting: Hematology & Oncology

## 2016-09-23 VITALS — BP 124/83 | HR 69 | Temp 97.8°F | Resp 18

## 2016-09-23 DIAGNOSIS — I739 Peripheral vascular disease, unspecified: Secondary | ICD-10-CM

## 2016-09-23 DIAGNOSIS — I82422 Acute embolism and thrombosis of left iliac vein: Secondary | ICD-10-CM

## 2016-09-23 DIAGNOSIS — I82402 Acute embolism and thrombosis of unspecified deep veins of left lower extremity: Secondary | ICD-10-CM | POA: Diagnosis not present

## 2016-09-23 DIAGNOSIS — I82412 Acute embolism and thrombosis of left femoral vein: Secondary | ICD-10-CM

## 2016-09-23 LAB — CMP (CANCER CENTER ONLY)
ALBUMIN: 2.9 g/dL — AB (ref 3.3–5.5)
ALK PHOS: 86 U/L — AB (ref 26–84)
ALT: 17 U/L (ref 10–47)
AST: 25 U/L (ref 11–38)
BILIRUBIN TOTAL: 0.6 mg/dL (ref 0.20–1.60)
BUN, Bld: 22 mg/dL (ref 7–22)
CALCIUM: 9.2 mg/dL (ref 8.0–10.3)
CO2: 29 mEq/L (ref 18–33)
CREATININE: 0.7 mg/dL (ref 0.6–1.2)
Chloride: 101 mEq/L (ref 98–108)
Glucose, Bld: 81 mg/dL (ref 73–118)
Potassium: 3.7 mEq/L (ref 3.3–4.7)
SODIUM: 140 meq/L (ref 128–145)
TOTAL PROTEIN: 7.3 g/dL (ref 6.4–8.1)

## 2016-09-23 LAB — CBC WITH DIFFERENTIAL (CANCER CENTER ONLY)
BASO#: 0 10*3/uL (ref 0.0–0.2)
BASO%: 0.2 % (ref 0.0–2.0)
EOS ABS: 0.1 10*3/uL (ref 0.0–0.5)
EOS%: 1 % (ref 0.0–7.0)
HEMATOCRIT: 39.4 % (ref 38.7–49.9)
HEMOGLOBIN: 13.3 g/dL (ref 13.0–17.1)
LYMPH#: 1.7 10*3/uL (ref 0.9–3.3)
LYMPH%: 29.6 % (ref 14.0–48.0)
MCH: 31.8 pg (ref 28.0–33.4)
MCHC: 33.8 g/dL (ref 32.0–35.9)
MCV: 94 fL (ref 82–98)
MONO#: 0.8 10*3/uL (ref 0.1–0.9)
MONO%: 13 % (ref 0.0–13.0)
NEUT%: 56.2 % (ref 40.0–80.0)
NEUTROS ABS: 3.3 10*3/uL (ref 1.5–6.5)
Platelets: 130 10*3/uL — ABNORMAL LOW (ref 145–400)
RBC: 4.18 10*6/uL — AB (ref 4.20–5.70)
RDW: 13.8 % (ref 11.1–15.7)
WBC: 5.9 10*3/uL (ref 4.0–10.0)

## 2016-09-23 LAB — TECHNOLOGIST REVIEW CHCC SATELLITE

## 2016-09-23 NOTE — Progress Notes (Signed)
Hematology and Oncology Follow Up Visit  JACAREE GULLEDGE [id number] 1946-06-16 71 y.o. 09/23/2016   Principle Diagnosis:   DVT of the left femoral vein  Lupus anticoagulant positive  Current Therapy:    Xarelto 20 mg by mouth daily-lifelong     Interim History:  Mr. Woolfork is back for follow-up. I first saw him back in November. At that time, he really had a Doppler done which showed a residual thrombus.  He has multiple, multiple health issues. He has very bad degenerative arthritis area did he is going to have a hip injection into the left hip this Thursday.  I told him to stop the Xarelto after Tuesday and then restart on Friday.  We did do a Doppler today. There is no thrombus noted in his femoral vein.  We'll last saw him, we did do hypercoagulable studies. Everything came back normal outside of a lupus anticoagulant. I am not sure if this truly is positive. We will have to follow up on this.  Again, I think thromboembolic disease is probably the least of his worries right now.  He comes in a wheelchair. He really is not able to walk because of severe arthritic issues.  His appetite is doing okay. He's had no nausea or vomiting.  Overall, I was advised performance status is ECOG 3.  Medications:  Current Outpatient Prescriptions:  .  Abatacept 125 MG/ML SOSY, Inject into the skin once a week. Reported on 10/24/2015, Disp: , Rfl:  .  ARIPiprazole (ABILIFY) 30 MG tablet, Take 30 mg by mouth at bedtime.  , Disp: , Rfl:  .  calcium carbonate (OS-CAL) 600 MG TABS, Take 1,200 mg by mouth daily.  , Disp: , Rfl:  .  calcium-vitamin D (CALCIUM 500/D) 500-200 MG-UNIT tablet, Take by mouth., Disp: , Rfl:  .  divalproex (DEPAKOTE ER) 500 MG 24 hr tablet, 4 tabs at bedtime, Disp: , Rfl:  .  DOCOSAHEXAENOIC ACID PO, Take 1 tablet by mouth daily., Disp: , Rfl:  .  folic acid (FOLVITE) 1 MG tablet, Take 1 mg by mouth daily.  , Disp: , Rfl:  .  gabapentin (NEURONTIN) 300 MG  capsule, Take 300 mg by mouth 2 (two) times daily. , Disp: , Rfl:  .  methotrexate (RHEUMATREX) 2.5 MG tablet, Take 17.5 mg by mouth once a week. Reported on 12/06/2015, Disp: , Rfl:  .  metoprolol succinate (TOPROL-XL) 25 MG 24 hr tablet, Take 12.5 mg by mouth daily., Disp: , Rfl:  .  mirabegron ER (MYRBETRIQ) 50 MG TB24 tablet, Take 50 mg by mouth daily., Disp: , Rfl:  .  Multiple Vitamins-Minerals (ZINC PO), Take by mouth daily., Disp: , Rfl:  .  Omega-3 1000 MG CAPS, Take by mouth., Disp: , Rfl:  .  omeprazole (PRILOSEC) 20 MG capsule, Take 20 mg by mouth every morning. , Disp: , Rfl:  .  oxybutynin (DITROPAN) 5 MG tablet, Take 5 mg by mouth 2 (two) times daily., Disp: , Rfl:  .  rivaroxaban (XARELTO) 20 MG TABS tablet, Take 1 tablet (20 mg total) by mouth daily with supper., Disp: 90 tablet, Rfl: 1 .  vitamin C (ASCORBIC ACID) 500 MG tablet, Take 500 mg by mouth daily., Disp: , Rfl:  .  ziprasidone (GEODON) 60 MG capsule, Take 60 mg by mouth daily. , Disp: , Rfl:   Allergies:  Allergies  Allergen Reactions  . Leflunomide Other (See Comments)    diarrhea  . Morphine And Related Nausea And Vomiting  .  Plaquenil [Hydroxychloroquine Sulfate]     rash    Past Medical History, Surgical history, Social history, and Family History were reviewed and updated.  Review of Systems: As above  Physical Exam:  oral temperature is 97.8 F (36.6 C). His blood pressure is 124/83 and his pulse is 69. His respiration is 18 and oxygen saturation is 99%.   Wt Readings from Last 3 Encounters:  06/20/16 207 lb 12.8 oz (94.3 kg)  06/03/16 213 lb 8 oz (96.8 kg)  04/03/16 210 lb 8 oz (95.5 kg)     Head and neck exam shows no ocular or oral lesions. There are no palpable cervical or supraclavicular lymph nodes. His lungs are with some slight decrease at the bases. Cardiac exam regular in rhythm. He has a 4/6 systolic ejection murmur consistent with his aortic stenosis. Abdomen is soft. He is moderately  obese. There is no fluid wave. There is no palpable liver or spleen tip. Extremities shows wrappings around both legs. He has obvious venous stasis dermatitis changes in both legs below the knees. He has arthritic deformities of his hands. Skin exam shows the stasis dermatitis changes in his lower legs. I see no ecchymoses. He has no petechia. Neurological exam shows no obvious neurological deficits.  Lab Results  Component Value Date   WBC 5.9 09/23/2016   HGB 13.3 09/23/2016   HCT 39.4 09/23/2016   MCV 94 09/23/2016   PLT 130 (L) 09/23/2016     Chemistry      Component Value Date/Time   NA 140 09/23/2016 1114   K 3.7 09/23/2016 1114   CL 101 09/23/2016 1114   CO2 29 09/23/2016 1114   BUN 22 09/23/2016 1114   CREATININE 0.7 09/23/2016 1114   GLU 72 11/10/2012      Component Value Date/Time   CALCIUM 9.2 09/23/2016 1114   ALKPHOS 86 (H) 09/23/2016 1114   AST 25 09/23/2016 1114   ALT 17 09/23/2016 1114   BILITOT 0.60 09/23/2016 1114         Impression and Plan: Mr. Quesnel is A 71 year old white male. He has multiple, multiple health issues. He has a history of thromboembolic disease. Again, I don't think this is really going to be a big issue for him.  We may want to consider is maintenance therapy for the Xarelto. Possibly, getting him on 10 mg day would not be a bad idea in the future.  If he truly has a lupus anticoagulant, we may have to consider low-dose aspirin.  I'll plan to see him back in 3 months. I don't think we have to do another Doppler of his left leg.  As always, I think him for his service to her country. He was in the [employer].   [name], MD 3/5/20181:41 PM

## 2016-09-24 LAB — D-DIMER, QUANTITATIVE: D-DIMER: 2.99 mg/L FEU — ABNORMAL HIGH (ref 0.00–0.49)

## 2016-09-26 DIAGNOSIS — M1612 Unilateral primary osteoarthritis, left hip: Secondary | ICD-10-CM | POA: Diagnosis not present

## 2016-09-26 DIAGNOSIS — M25552 Pain in left hip: Secondary | ICD-10-CM | POA: Diagnosis not present

## 2016-10-01 ENCOUNTER — Encounter: Payer: Self-pay | Admitting: Internal Medicine

## 2016-10-01 ENCOUNTER — Ambulatory Visit (INDEPENDENT_AMBULATORY_CARE_PROVIDER_SITE_OTHER): Payer: Medicare Other | Admitting: Internal Medicine

## 2016-10-01 VITALS — BP 126/74 | HR 59 | Temp 97.9°F | Resp 14 | Ht 61.0 in | Wt 214.0 lb

## 2016-10-01 DIAGNOSIS — I350 Nonrheumatic aortic (valve) stenosis: Secondary | ICD-10-CM

## 2016-10-01 DIAGNOSIS — M25552 Pain in left hip: Secondary | ICD-10-CM

## 2016-10-01 DIAGNOSIS — I82412 Acute embolism and thrombosis of left femoral vein: Secondary | ICD-10-CM | POA: Diagnosis not present

## 2016-10-01 DIAGNOSIS — I10 Essential (primary) hypertension: Secondary | ICD-10-CM | POA: Diagnosis not present

## 2016-10-01 NOTE — Progress Notes (Signed)
Subjective:    Patient ID: Shaun Yoder, male    DOB: 10/22/1945, 71 y.o.   MRN: [id number]  DOS:  10/01/2016 Type of visit - description : rov, Here with his wife Interval history: Since the last office visit, he is stable. Saw hematology, note reviewed. saw orthopedic surgery due to knee and hip pain, doing better. Good med compliance Chronic wound due to varicose veins essentially resolved, does not see the wound care center anymore.   Review of Systems Denies chest pain or difficulty breathing No nausea, vomiting, diarrhea  Past Medical History:  Diagnosis Date  . Anemia   . Cancer ([place])    bladder  . Colitis   . Colon polyps   . Depression   . Diverticulosis   . Gastritis   . GERD (gastroesophageal reflux disease)   . History of rectal polyps   . Hyperlipidemia   . Hypertension   . Iron deficiency   . Low back pain   . OA (osteoarthritis)   . Schizoaffective disorder, bipolar type ([place])   . Sleep apnea   . Varicose vein     Past Surgical History:  Procedure Laterality Date  . BLADDER SURGERY    . TOTAL KNEE ARTHROPLASTY Bilateral   . VARICOSE VEIN SURGERY      Social History   Social History  . Marital status: Married    Spouse name: N/A  . Number of children: 2  . Years of education: N/A   Occupational History  . retired    Social History Main Topics  . Smoking status: Former Smoker    Packs/day: 2.50    Years: 43.00    Types: Cigarettes    Quit date: 07/22/1998  . Smokeless tobacco: Never Used  . Alcohol use 0.0 oz/week     Comment: Occasional glass of wine  . Drug use: No  . Sexual activity: Not on file   Other Topics Concern  . Not on file   Social History Narrative   Originally from [place]. Moved to [facility] in July 22, 1985. 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 No known asbestos exposure. 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 No international travel. Has a dog currently. Remote exposure to Cockatiel in a different home. No mold exposure. No hot tub exposure.       Two adopted children      Allergies as of 10/01/2016      Reactions   Leflunomide Other (See Comments)   diarrhea   Morphine And Related Nausea And Vomiting   Plaquenil [hydroxychloroquine Sulfate]    rash      Medication List       Accurate as of 10/01/16  3:17 PM. Always use your most recent med list.          Abatacept 125 MG/ML Sosy Inject into the skin once a week. Reported on 10/24/2015   ARIPiprazole 30 MG tablet Commonly known as:  ABILIFY Take 30 mg by mouth at bedtime.   CALCIUM 500/D 500-200 MG-UNIT tablet Generic drug:  calcium-vitamin D Take by mouth.   calcium carbonate 600 MG Tabs tablet Commonly known as:  OS-CAL Take 1,200 mg by mouth daily.   divalproex 500 MG 24 hr tablet Commonly known as:  DEPAKOTE ER 4 tabs at bedtime   DOCOSAHEXAENOIC ACID PO Take 1 tablet  by mouth daily.   folic acid 1 MG tablet Commonly known as:  FOLVITE Take 1 mg by mouth daily.   gabapentin 300 MG capsule Commonly known as:  NEURONTIN Take 300 mg by mouth 2 (two) times daily.   methotrexate 2.5 MG tablet Commonly known as:  RHEUMATREX Take 17.5 mg by mouth once a week. Reported on 12/06/2015   metoprolol succinate 25 MG 24 hr tablet Commonly known as:  TOPROL-XL Take 12.5 mg by mouth daily.   mirabegron ER 50 MG Tb24 tablet Commonly known as:  MYRBETRIQ Take 50 mg by mouth daily.   Omega-3 1000 MG Caps Take by mouth.   omeprazole 20 MG capsule Commonly known as:  PRILOSEC Take 20 mg by mouth every morning.   oxybutynin 5 MG tablet Commonly known as:  DITROPAN Take 5 mg by mouth 2 (two) times daily.   rivaroxaban 20 MG Tabs tablet Commonly known as:  XARELTO Take 1 tablet (20 mg total) by mouth daily with supper.   vitamin C 500 MG tablet Commonly known  as:  ASCORBIC ACID Take 500 mg by mouth daily.   ZINC PO Take by mouth daily.   ziprasidone 60 MG capsule Commonly known as:  GEODON Take 120 mg by mouth daily.          Objective:   Physical Exam BP 126/74 (BP Location: Right Arm, Patient Position: Sitting, Cuff Size: Normal)   Pulse (!) 59   Temp 97.9 F (36.6 C) (Oral)   Resp 14   Ht 5\' 1"  (1.549 m)   Wt 214 lb (97.1 kg) Comment: Per Pt, unable to ambulate  SpO2 93%   BMI 40.43 kg/m  General:   Well developed, well nourished, sitting on a wheelchair   HEENT:  Normocephalic . Face symmetric, atraumatic Lungs:  CTA B Normal respiratory effort, no intercostal retractions, no accessory muscle use. Heart: RRR,  noticeable systolic murmur.  Several layers of wrapping noted. Skin: Not pale. Not jaundice Neurologic:  alert & oriented X3.  Speech normal, gait not tested Psych--  Cognition and judgment appear intact.  Cooperative with normal attention span and concentration.  Behavior appropriate. No anxious or depressed appearing.      Assessment & Plan:    Assessment   (new pt, transfer from [facility],02-2015) Hypertension Hyperlipidemia Ao stenosis, severe: Dr Burt Knack MSK: --Seronegative Rheumatoid arthritis-- Dr Lenna Gilford  --DJD-- DR Tamera Punt , dr Ronnie Derby --low back pain d/t DJD, Dr Sherlyn Lick  Schizophrenia, bipolar, depression: Follow-up at the [facility] GU: Bladder cancer-transitional cell, Dr. Shona Needles GI:   GERD, history of colitis, history of gastritis,  colon polyps- had a virtual cscope 2016, + polyps, declined further eval Pulmomany: Dr Ashok Cordia --Sleep apnea-- Cpap intolerant --CT nodules f/u 2 pulmonary L LEG DVT 02-2016 Varicose veins: s/p remote surgery, has LE wounds  H/o  iron deficiency anemia [facility], [facility], psych, Ortho, pain mngmt   PLAN  HTN: Recent BMP satisfactory, continue metoprolol Hyperlipidemia: Last FLP satisfactory Aortic stenosis: pt remains asx I suspect mostly  because he is not active DJD: saw ortho due to left knee and hip pain, told both sx due to hip DJD, s/p local injection in the hip, both sx better DVT: Resolved per [place] few days ago, still on Xarelto per hematology. Varicose veins wounds: Resolved, no longer sees the wound care center. Sees multiple other MDs, recent labs satisfactory: RTC 6 months

## 2016-10-01 NOTE — Assessment & Plan Note (Signed)
HTN: Recent BMP satisfactory, continue metoprolol Hyperlipidemia: Last FLP satisfactory Aortic stenosis: pt remains asx I suspect mostly because he is not active DJD: saw ortho due to left knee and hip pain, told both sx due to hip DJD, s/p local injection in the hip, both sx better DVT: Resolved per [place] few days ago, still on Xarelto per hematology. Varicose veins wounds: Resolved, no longer sees the wound care center. Sees multiple other MDs, recent labs satisfactory: RTC 6 months

## 2016-10-01 NOTE — Patient Instructions (Signed)
  GO TO THE FRONT DESK Schedule your next appointment for a check up in 6 months     

## 2016-10-01 NOTE — Progress Notes (Signed)
Pre visit review using our clinic review tool, if applicable. No additional management support is needed unless otherwise documented below in the visit note. 

## 2016-10-04 ENCOUNTER — Other Ambulatory Visit: Payer: Self-pay | Admitting: Internal Medicine

## 2016-10-04 DIAGNOSIS — I82409 Acute embolism and thrombosis of unspecified deep veins of unspecified lower extremity: Secondary | ICD-10-CM

## 2016-10-04 NOTE — Telephone Encounter (Signed)
Okay 30 and 2 refills or 90 and 0 refills

## 2016-10-04 NOTE — Telephone Encounter (Signed)
Rx sent 

## 2016-10-04 NOTE — Telephone Encounter (Signed)
Okay to refill Xarelto 20mg  for Pt?

## 2016-10-16 DIAGNOSIS — Z79899 Other long term (current) drug therapy: Secondary | ICD-10-CM | POA: Diagnosis not present

## 2016-10-16 DIAGNOSIS — M05831 Other rheumatoid arthritis with rheumatoid factor of right wrist: Secondary | ICD-10-CM | POA: Diagnosis not present

## 2016-10-16 DIAGNOSIS — M05832 Other rheumatoid arthritis with rheumatoid factor of left wrist: Secondary | ICD-10-CM | POA: Diagnosis not present

## 2016-10-16 DIAGNOSIS — M0589 Other rheumatoid arthritis with rheumatoid factor of multiple sites: Secondary | ICD-10-CM | POA: Diagnosis not present

## 2016-10-16 DIAGNOSIS — M05841 Other rheumatoid arthritis with rheumatoid factor of right hand: Secondary | ICD-10-CM | POA: Diagnosis not present

## 2016-10-24 ENCOUNTER — Encounter: Payer: Self-pay | Admitting: *Deleted

## 2016-10-24 ENCOUNTER — Ambulatory Visit (INDEPENDENT_AMBULATORY_CARE_PROVIDER_SITE_OTHER): Payer: Medicare Other | Admitting: *Deleted

## 2016-10-24 VITALS — BP 108/72 | HR 52 | Ht 61.0 in

## 2016-10-24 DIAGNOSIS — Z0000 Encounter for general adult medical examination without abnormal findings: Secondary | ICD-10-CM | POA: Diagnosis not present

## 2016-10-24 DIAGNOSIS — G8929 Other chronic pain: Secondary | ICD-10-CM

## 2016-10-24 NOTE — Progress Notes (Signed)
Pre visit review using our clinic review tool, if applicable. No additional management support is needed unless otherwise documented below in the visit note. 

## 2016-10-24 NOTE — Patient Instructions (Addendum)
  Mr. Hubbert , Thank you for taking time to come for your Medicare Wellness Visit. I appreciate your ongoing commitment to your health goals. Please review the following plan we discussed and let me know if I can assist you in the future.   Bring a copy of your advance directives to your next office visit.  Please follow-up with the [facility] regarding your referral to pain management.  You can call the [employer] at [phone] for assistance changing smoke detector batteries.  These are the goals we discussed: Goals    . Increase physical activity          Start PT through the [facility] [place].       This is a list of the screening recommended for you and due dates:  Health Maintenance  Topic Date Due  . Flu Shot  02/19/2017  . Tetanus Vaccine  11/18/2020  . Colon Cancer Screening  11/09/2024  .  Hepatitis C: One time screening is recommended by Center for Disease Control  (CDC) for  adults born from [birth year] through 1965.   Completed  . Pneumonia vaccines  Completed

## 2016-10-24 NOTE — Progress Notes (Addendum)
Subjective:   Shaun Yoder is a 71 y.o. male who presents for Medicare Annual/Subsequent preventive examination. He is here today w/ his wife.  [facility] [place] [facility] including PCP, ophthalmology, dental, and psych. [facility] was going to [facility] him to pain management for his chronic shoulder, back, and hip pain. That was back in January and they have not heard anything since then. They would like referral from our office.  Review of Systems:  No ROS.  Medicare Wellness Visit.  Cardiac Risk Factors include: advanced age (>51men, >35 women);dyslipidemia;male gender;hypertension;obesity (BMI >30kg/m2);sedentary lifestyle  Sleep patterns: falls asleep easily, has interrupted sleep, has daytime sleepiness and gets up 2-3 times nightly to void. Sleeps sporadically through the night-starts in hospital bed and then usually gets up and sleep in lift recliner at some point. Takes frequent daytime naps. Home Safety/Smoke Alarms: Feels safe in home. Smoke alarms in place.  Living environment; residence and Firearm Safety: Lives w/ wife and daughter and an 30 week old 19. 1-story house/ trailer, ramped, can live on one level, walk-in shower, equipment: Cane, Type: Single Point Cane, Walkers, Type: Conservation officer, nature, [employer], Type: Tub Editor, commissioning, Type: power wheelchair. Grab bars in shower. Night lights throughout the home. Wife and daughter assist w/ ADLs and medications. Seat Belt Safety/Bike Helmet: Wears seat belt.   Counseling:   Eye Exam- Follows w/ the [place]. Dental- [facility]. Dentures upper and lower.  Male:   CCS- last virtual colonoscopy 11/10/14. 74mm polyp, moderate diverticulosis.     PSA-  Lab Results  Component Value Date   PSA 0.505 10/19/2015   PSA 1.10 Test Methodology: Hybritech PSA 03/17/2009         Objective:    Vitals: BP 108/72   Pulse (!) 52   Ht 5\' 1"  (1.549 m)   SpO2 96%   There is no height  or weight on file to calculate BMI.  BP Readings from Last 3 Encounters:  10/24/16 108/72  10/01/16 126/74  09/23/16 124/83    Tobacco History  Smoking Status  . Former Smoker  . Packs/day: 2.50  . Years: 43.00  . Types: Cigarettes  . Quit date: 07/22/1998  Smokeless Tobacco  . Never Used     Counseling given: Not Answered   Past Medical History:  Diagnosis Date  . Anemia   . Cancer ([place])    bladder  . Colitis   . Colon polyps   . Depression   . Diverticulosis   . Gastritis   . GERD (gastroesophageal reflux disease)   . History of rectal polyps   . Hyperlipidemia   . Hypertension   . Iron deficiency   . Low back pain   . OA (osteoarthritis)   . 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 Schizoaffective disorder, bipolar type ([place])   . Sleep apnea   . Varicose vein    Past Surgical History:  Procedure Laterality Date  . BLADDER SURGERY    . TOTAL KNEE ARTHROPLASTY Bilateral   . VARICOSE VEIN SURGERY     Family History  Problem Relation Age of Onset  . Heart attack Mother   . Heart attack Father   . Stroke Father   . Rheumatologic disease Neg Hx   . Colon cancer Neg Hx   . Lung disease Neg Hx   . Prostate cancer Neg Hx    History  Sexual Activity  . Sexual  activity: Not on file    Outpatient Encounter Prescriptions as of 10/24/2016  Medication Sig  . Abatacept 125 MG/ML SOSY Inject into the skin once a week. Reported on 10/24/2015  . ARIPiprazole (ABILIFY) 30 MG tablet Take 30 mg by mouth at bedtime.    . calcium-vitamin D (CALCIUM 500/D) 500-200 MG-UNIT tablet Take by mouth.  . divalproex (DEPAKOTE ER) 500 MG 24 hr tablet 4 tabs at bedtime  . folic acid (FOLVITE) 1 MG tablet Take 1 mg by mouth daily.    [name] gabapentin (NEURONTIN) 300 MG capsule Take 300 mg by mouth 2 (two) times daily.   . methotrexate (RHEUMATREX) 2.5 MG tablet Take 17.5 mg by mouth once a week. Reported on 12/06/2015  . metoprolol succinate (TOPROL-XL) 25 MG 24 hr tablet Take 12.5 mg  by mouth daily.  . mirabegron ER (MYRBETRIQ) 50 MG TB24 tablet Take 50 mg by mouth daily.  . Multiple Vitamins-Minerals (ZINC PO) Take by mouth daily.  . Omega-3 1000 MG CAPS Take by mouth.  [name] omeprazole (PRILOSEC) 20 MG capsule Take 20 mg by mouth every morning.   [name] oxybutynin (DITROPAN) 5 MG tablet Take 5 mg by mouth 2 (two) times daily.  . rivaroxaban (XARELTO) 20 MG TABS tablet Take 1 tablet (20 mg total) by mouth daily with supper.  . vitamin C (ASCORBIC ACID) 500 MG tablet Take 500 mg by mouth daily.  . ziprasidone (GEODON) 60 MG capsule Take 120 mg by mouth daily.   . [DISCONTINUED] calcium carbonate (OS-CAL) 600 MG TABS Take 1,200 mg by mouth daily.    . [DISCONTINUED] DOCOSAHEXAENOIC ACID PO Take 1 tablet by mouth daily.   No facility-administered encounter medications on file as of 10/24/2016.     Activities of Daily Living In your present state of health, do you have any difficulty performing the following activities: 10/24/2016 03/01/2016  Hearing? N N  Vision? N N  Difficulty concentrating or making decisions? [name]  Walking or climbing stairs? Y Y  Dressing or bathing? Y Y  Doing errands, shopping? [name]  Preparing Food and eating ? N -  Using the Toilet? N -  In the past six months, have you accidently leaked urine? N -  Do you have problems with loss of bowel control? N -  Managing your Medications? Y -  Managing your Finances? N -  Housekeeping or managing your Housekeeping? Y -  Some recent data might be hidden    Patient Care Team: Colon Branch, MD as PCP - General (Internal Medicine) [name], MD as Consulting Physician (Rheumatology) Sherren Mocha, MD as Consulting Physician (Cardiology) Javier Glazier, MD as Consulting Physician (Pulmonary Disease) Bea Laura, MD as Consulting Physician (Internal Medicine) Franchot Gallo, MD as Consulting Physician (Urology) Vickey Huger, MD as Consulting Physician (Orthopedic Surgery) Murlean Iba, MD  as Consulting Physician (Orthopedic Surgery) [name], MD as Consulting Physician (Oncology)   Assessment:    Physical assessment deferred to PCP.  Exercise Activities and Dietary recommendations Current Exercise Habits: The patient does not participate in regular exercise at present, Exercise limited by: orthopedic condition(s);cardiac condition(s)  Diet (meal preparation, eat out, water intake, caffeinated beverages, dairy products, fruits and vegetables): in general, a "healthy" diet  , on average, 2 meals per day. Diet is mostly fruits and vegetables, fish, chicken. Regular diet. Drinks lots of water and unsweetened tea.      Goals    . Increase physical activity  Start PT through the [place].      Fall Risk Fall Risk  10/24/2016 09/23/2016 06/20/2016 03/01/2016 10/24/2015  Falls in the past year? Yes Yes Yes Yes No  Number falls in past yr: 2 or more 2 or more 2 or more 2 or more -  Injury with Fall? - Yes Yes Yes -  Risk Factor Category  - High Fall Risk High Fall Risk High Fall Risk -  Risk for fall due to : - - - - Impaired mobility  Risk for fall due to (comments): - - - - Walks with walker; electric wheelchair for long distance; ramps present.   Follow up Education provided Education provided;Falls prevention discussed Falls prevention discussed Falls evaluation completed;Education provided;Falls prevention discussed -   Depression Screen PHQ 2/9 Scores 10/24/2016 03/01/2016 10/24/2015 03/22/2015  PHQ - 2 Score 0 0 0 0    Cognitive Function MMSE - Mini Mental State Exam 10/24/2016 10/24/2015  Orientation to time 5 5  Orientation to Place 5 5  Registration 3 3  Attention/ Calculation 5 5  Recall 1 2  Language- name 2 objects 2 2  Language- repeat 1 1  Language- follow 3 step command 3 3  Language- read & follow direction 1 1  Write a sentence 1 1  Copy design 1 1  Total score 28 29        Immunization History  Administered Date(s) Administered  . Influenza, High  Dose Seasonal PF 05/23/2015, 04/03/2016  . Influenza-Unspecified 04/21/2014  . Pneumococcal Conjugate-13 04/21/2014  . Pneumococcal Polysaccharide-23 05/23/2015  . Tdap 11/19/2010   Screening Tests Health Maintenance  Topic Date Due  . INFLUENZA VACCINE  02/19/2017  . TETANUS/TDAP  11/18/2020  . COLONOSCOPY  11/09/2024  . Hepatitis C Screening  Completed  . PNA vac Low Risk Adult  Completed      Plan:   Follow-up w/ PCP as scheduled.  Bring a copy of your advance directives to your next office visit.  Referral to pain management for chronic pain per PCP.  During the course of the visit the patient was educated and counseled about the following appropriate screening and preventive services:   Vaccines to include Pneumococcal, Influenza, Td, HCV  Cardiovascular Disease  Colorectal cancer screening  Diabetes screening  Prostate Cancer Screening  Glaucoma screening  Nutrition counseling   Patient Instructions (the written plan) was given to the patient.    Dorrene German, RN  10/24/2016  Kathlene November, MD

## 2016-10-29 DIAGNOSIS — D224 Melanocytic nevi of scalp and neck: Secondary | ICD-10-CM | POA: Diagnosis not present

## 2016-10-29 DIAGNOSIS — L218 Other seborrheic dermatitis: Secondary | ICD-10-CM | POA: Diagnosis not present

## 2016-10-30 DIAGNOSIS — M79672 Pain in left foot: Secondary | ICD-10-CM | POA: Diagnosis not present

## 2016-10-30 DIAGNOSIS — M79671 Pain in right foot: Secondary | ICD-10-CM | POA: Diagnosis not present

## 2016-10-30 DIAGNOSIS — B351 Tinea unguium: Secondary | ICD-10-CM | POA: Diagnosis not present

## 2016-11-04 ENCOUNTER — Telehealth: Payer: Self-pay | Admitting: Internal Medicine

## 2016-11-04 DIAGNOSIS — M25552 Pain in left hip: Principal | ICD-10-CM

## 2016-11-04 DIAGNOSIS — G8929 Other chronic pain: Secondary | ICD-10-CM

## 2016-11-04 NOTE — Telephone Encounter (Signed)
Caller name: Arlene Relationship to patient: Wife Can be reached: [phone]  Pharmacy:  Reason for call: Request referral for pain management

## 2016-11-04 NOTE — Telephone Encounter (Signed)
Okay to place referral

## 2016-11-04 NOTE — Telephone Encounter (Signed)
Referral placed.

## 2016-11-04 NOTE — Telephone Encounter (Signed)
Yes, dx hip pain

## 2016-11-15 DIAGNOSIS — M1990 Unspecified osteoarthritis, unspecified site: Secondary | ICD-10-CM | POA: Diagnosis not present

## 2016-11-15 DIAGNOSIS — I890 Lymphedema, not elsewhere classified: Secondary | ICD-10-CM | POA: Diagnosis not present

## 2016-11-15 DIAGNOSIS — L97222 Non-pressure chronic ulcer of left calf with fat layer exposed: Secondary | ICD-10-CM | POA: Diagnosis not present

## 2016-11-15 DIAGNOSIS — I872 Venous insufficiency (chronic) (peripheral): Secondary | ICD-10-CM | POA: Diagnosis not present

## 2016-11-15 DIAGNOSIS — Z87891 Personal history of nicotine dependence: Secondary | ICD-10-CM | POA: Diagnosis not present

## 2016-11-15 DIAGNOSIS — L97822 Non-pressure chronic ulcer of other part of left lower leg with fat layer exposed: Secondary | ICD-10-CM | POA: Diagnosis not present

## 2016-11-15 DIAGNOSIS — I82409 Acute embolism and thrombosis of unspecified deep veins of unspecified lower extremity: Secondary | ICD-10-CM | POA: Diagnosis not present

## 2016-11-15 DIAGNOSIS — L089 Local infection of the skin and subcutaneous tissue, unspecified: Secondary | ICD-10-CM | POA: Diagnosis not present

## 2016-11-22 DIAGNOSIS — L97222 Non-pressure chronic ulcer of left calf with fat layer exposed: Secondary | ICD-10-CM | POA: Diagnosis not present

## 2016-11-22 DIAGNOSIS — I872 Venous insufficiency (chronic) (peripheral): Secondary | ICD-10-CM | POA: Diagnosis not present

## 2016-11-22 DIAGNOSIS — L97822 Non-pressure chronic ulcer of other part of left lower leg with fat layer exposed: Secondary | ICD-10-CM | POA: Diagnosis not present

## 2016-11-22 DIAGNOSIS — Z87891 Personal history of nicotine dependence: Secondary | ICD-10-CM | POA: Diagnosis not present

## 2016-11-22 DIAGNOSIS — M1990 Unspecified osteoarthritis, unspecified site: Secondary | ICD-10-CM | POA: Diagnosis not present

## 2016-11-22 DIAGNOSIS — I890 Lymphedema, not elsewhere classified: Secondary | ICD-10-CM | POA: Diagnosis not present

## 2016-11-22 DIAGNOSIS — I82409 Acute embolism and thrombosis of unspecified deep veins of unspecified lower extremity: Secondary | ICD-10-CM | POA: Diagnosis not present

## 2016-11-22 DIAGNOSIS — D090 Carcinoma in situ of bladder: Secondary | ICD-10-CM | POA: Diagnosis not present

## 2016-11-29 DIAGNOSIS — R413 Other amnesia: Secondary | ICD-10-CM | POA: Diagnosis not present

## 2016-11-29 DIAGNOSIS — Z87891 Personal history of nicotine dependence: Secondary | ICD-10-CM | POA: Diagnosis not present

## 2016-11-29 DIAGNOSIS — L97822 Non-pressure chronic ulcer of other part of left lower leg with fat layer exposed: Secondary | ICD-10-CM | POA: Diagnosis not present

## 2016-11-29 DIAGNOSIS — I872 Venous insufficiency (chronic) (peripheral): Secondary | ICD-10-CM | POA: Diagnosis not present

## 2016-11-29 DIAGNOSIS — I890 Lymphedema, not elsewhere classified: Secondary | ICD-10-CM | POA: Diagnosis not present

## 2016-11-29 DIAGNOSIS — M1990 Unspecified osteoarthritis, unspecified site: Secondary | ICD-10-CM | POA: Diagnosis not present

## 2016-11-29 DIAGNOSIS — G603 Idiopathic progressive neuropathy: Secondary | ICD-10-CM | POA: Diagnosis not present

## 2016-12-04 ENCOUNTER — Other Ambulatory Visit: Payer: Self-pay | Admitting: Internal Medicine

## 2016-12-04 DIAGNOSIS — I82409 Acute embolism and thrombosis of unspecified deep veins of unspecified lower extremity: Secondary | ICD-10-CM

## 2016-12-06 ENCOUNTER — Encounter: Payer: Self-pay | Admitting: Physical Medicine & Rehabilitation

## 2016-12-06 ENCOUNTER — Encounter: Payer: Medicare Other | Attending: Physical Medicine & Rehabilitation

## 2016-12-06 ENCOUNTER — Ambulatory Visit (HOSPITAL_BASED_OUTPATIENT_CLINIC_OR_DEPARTMENT_OTHER): Payer: Medicare Other | Admitting: Physical Medicine & Rehabilitation

## 2016-12-06 VITALS — BP 136/91 | HR 62 | Resp 14

## 2016-12-06 DIAGNOSIS — M1612 Unilateral primary osteoarthritis, left hip: Secondary | ICD-10-CM

## 2016-12-06 DIAGNOSIS — M25512 Pain in left shoulder: Secondary | ICD-10-CM | POA: Diagnosis not present

## 2016-12-06 DIAGNOSIS — Z87891 Personal history of nicotine dependence: Secondary | ICD-10-CM | POA: Insufficient documentation

## 2016-12-06 DIAGNOSIS — I10 Essential (primary) hypertension: Secondary | ICD-10-CM | POA: Diagnosis not present

## 2016-12-06 DIAGNOSIS — M87812 Other osteonecrosis, left shoulder: Secondary | ICD-10-CM | POA: Insufficient documentation

## 2016-12-06 DIAGNOSIS — M25511 Pain in right shoulder: Secondary | ICD-10-CM | POA: Insufficient documentation

## 2016-12-06 DIAGNOSIS — Z859 Personal history of malignant neoplasm, unspecified: Secondary | ICD-10-CM | POA: Diagnosis not present

## 2016-12-06 DIAGNOSIS — M87811 Other osteonecrosis, right shoulder: Secondary | ICD-10-CM | POA: Diagnosis not present

## 2016-12-06 DIAGNOSIS — M05719 Rheumatoid arthritis with rheumatoid factor of unspecified shoulder without organ or systems involvement: Secondary | ICD-10-CM

## 2016-12-06 DIAGNOSIS — M24511 Contracture, right shoulder: Secondary | ICD-10-CM | POA: Insufficient documentation

## 2016-12-06 DIAGNOSIS — G8929 Other chronic pain: Secondary | ICD-10-CM | POA: Diagnosis not present

## 2016-12-06 DIAGNOSIS — K219 Gastro-esophageal reflux disease without esophagitis: Secondary | ICD-10-CM | POA: Insufficient documentation

## 2016-12-06 DIAGNOSIS — M1990 Unspecified osteoarthritis, unspecified site: Secondary | ICD-10-CM | POA: Insufficient documentation

## 2016-12-06 MED ORDER — TRAMADOL HCL 50 MG PO TABS: 50.0000 mg | ORAL_TABLET | Freq: Two times a day (BID) | ORAL | 1 refills | Status: DC | PRN

## 2016-12-06 NOTE — Patient Instructions (Signed)
Planning right suprascapular nerve block with Marcaine  If this provides only a short-term relief, would recommend cryoablation

## 2016-12-06 NOTE — Progress Notes (Signed)
Subjective:    Patient ID: Shaun Yoder, male    DOB: June 29, 1946, 71 y.o.   MRN: [id number]  HPI CC:  Multifactorial pain worst in shoulder  Sees Ortho at [facility] and Dr Tamera Punt has osteonecrosis of both shoulders, according to his wife. Do not have x-rays  Tylenol #3 with codeine has taken ~20 pills in 5 mo. Never tried tramadol  Has appt with Rheumatology  Needs help with bathing and dressing, uses urinal at night Use WC in community and uses electric WC at home , uses cane to transfer 2-3 steps   Had 3 falls last winter, recently finished PT 2x per week worked on transfers to minimize shoulder pain  Patient also underwent left fluoroscopic guided hip injection on 3 occasions with Dr Ron Agee at [facility], averaged 1.5 weeks of relief with each injection.  PMH schizoaffective d/o, Controlled on medication Pain Inventory Average Pain 9 Pain Right Now 10 My pain is intermittent and sharp  In the last 24 hours, has pain interfered with the following? General activity 9 Relation with others 8 Enjoyment of life 10 What TIME of day is your pain at its worst? morning, daytime Sleep (in general) Fair  Pain is worse with: walking, bending, sitting, inactivity, standing and some activites Pain improves with: medication and injections Relief from Meds: 3  Mobility walk with assistance use a cane use a walker how many minutes can you walk? 1-2 ability to climb steps?  no do you drive?  no use a wheelchair needs help with transfers Do you have any goals in this area?  yes  Function retired I need assistance with the following:  dressing, bathing, toileting, meal prep, household duties and shopping  Neuro/Psych bladder control problems weakness numbness tremor trouble walking depression  Prior Studies bone scan x-rays CT/MRI nerve study new visit CLINICAL DATA:  Left groin pain for 3 weeks.  No known injury.  EXAM: DG HIP (WITH OR WITHOUT PELVIS)  2-3V LEFT  COMPARISON:  None.  FINDINGS: Advanced degenerative changes in the left hip with near complete joint space loss and subchondral sclerosis. Mild spurring. Mild degenerative changes in the right hip. SI joints are symmetric and unremarkable. No acute bony abnormality. Specifically, no fracture, subluxation, or dislocation. Soft tissues are intact.  IMPRESSION: Advanced degenerative changes on the left. Mild degenerative changes on the right. No acute findings.   Electronically Signed   By: Rolm Baptise M.D.   On: 06/03/2016 15:36Physicians involved in your care Primary care , Neurologist , Psychiatrist , Rheumatologist . Orthopedist . new visit   Family History  Problem Relation Age of Onset  . Heart attack Mother   . Heart attack Father   . Stroke Father   . Rheumatologic disease Neg Hx   . Colon cancer Neg Hx   . Lung disease Neg Hx   . Prostate cancer Neg Hx    Social History   Social History  . Marital status: Married    Spouse name: N/A  . Number of children: 2  . Years of education: N/A   Occupational History  . retired    Social History Main Topics  . Smoking status: Former Smoker    Packs/day: 2.50    Years: 43.00    Types: Cigarettes    Quit date: 07/22/1998  . Smokeless tobacco: Never Used  . Alcohol use 0.0 oz/week     Comment: Occasional glass of wine  . Drug use: No  . Sexual activity: Not  Asked   Other Topics Concern  . None   Social History Narrative   Originally from [place]. Moved to [facility] in July 22, 1985. 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 No known asbestos exposure. 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 No international travel. Has a dog currently. Remote exposure to Cockatiel in a different home. No mold exposure. No hot tub exposure.       Two adopted  children   Past Surgical History:  Procedure Laterality Date  . BLADDER SURGERY    . TOTAL KNEE ARTHROPLASTY Bilateral   . VARICOSE VEIN SURGERY     Past Medical History:  Diagnosis Date  . Anemia   . Cancer ([place])    bladder  . Colitis   . Colon polyps   . Depression   . Diverticulosis   . DVT of deep femoral vein ([place])   . Gastritis   . GERD (gastroesophageal reflux disease)   . History of rectal polyps   . Hyperlipidemia   . Hypertension   . Iron deficiency   . Low back pain   . OA (osteoarthritis)   . Osteonecrosis of shoulder region ([place])   . Schizoaffective disorder, bipolar type ([place])   . Sleep apnea   . Varicose vein    BP (!) 136/91 (BP Location: Left Arm, Patient Position: Sitting, Cuff Size: Normal)   Pulse 62   Resp 14   SpO2 93%   Opioid Risk Score:   Fall Risk Score:  `1  Depression screen PHQ 2/9  Depression screen [facility] 2/9 10/24/2016 03/01/2016 10/24/2015 03/22/2015  Decreased Interest 0 0 0 0  Down, Depressed, Hopeless 0 0 0 0  PHQ - 2 Score 0 0 0 0    Review of Systems  HENT: Negative.   Eyes: Negative.   Respiratory: Positive for shortness of breath.   Cardiovascular: Negative.   Gastrointestinal: Positive for diarrhea.  Endocrine: Negative.   Genitourinary: Positive for difficulty urinating.  Musculoskeletal: Positive for arthralgias, back pain, gait problem and myalgias.  Skin: Positive for rash.  Allergic/Immunologic: Negative.   Neurological: Positive for tremors, weakness and numbness.  Hematological: Bruises/bleeds easily.  Psychiatric/Behavioral: Positive for dysphoric mood.       Objective:   Physical Exam  Constitutional: He is oriented to person, place, and time. He appears well-developed and well-nourished.  HENT:  Head: Normocephalic and atraumatic.  Eyes: Conjunctivae and EOM are normal. Pupils are equal, round, and reactive to light.  Neck: Normal range of motion.  Cardiovascular: Normal rate and regular rhythm.   Murmur  heard.  Systolic murmur is present with a grade of 3/6  Pulmonary/Chest: Effort normal and breath sounds normal.  Abdominal: Soft. Bowel sounds are normal.  Musculoskeletal:       Right shoulder: He exhibits decreased range of motion, crepitus, pain and decreased strength. He exhibits no effusion.       Left shoulder: He exhibits decreased range of motion, crepitus, pain and decreased strength.       Right elbow: He exhibits decreased range of motion and deformity. Tenderness found. Medial epicondyle and lateral epicondyle tenderness noted.       Left elbow: He exhibits decreased range of motion and deformity. Tenderness found. Medial epicondyle and lateral epicondyle tenderness noted.       Right wrist: He exhibits decreased range of motion  and deformity.       Left wrist: He exhibits decreased range of motion and deformity.       Right hip: He exhibits normal range of motion.       Left hip: He exhibits decreased range of motion.       Right knee: He exhibits decreased range of motion. He exhibits no effusion.       Left knee: He exhibits decreased range of motion. He exhibits no effusion.       Right ankle: He exhibits decreased range of motion. He exhibits no deformity.       Left ankle: He exhibits decreased range of motion. He exhibits no deformity.       Cervical back: He exhibits decreased range of motion. He exhibits no deformity.       Lumbar back: He exhibits decreased range of motion.  Neurological: He is alert and oriented to person, place, and time. He displays atrophy. A sensory deficit is present. He exhibits normal muscle tone. Gait abnormal.  Reflex Scores:      Tricep reflexes are 1+ on the right side and 2+ on the left side.      Bicep reflexes are 1+ on the right side and 2+ on the left side.      Brachioradialis reflexes are 1+ on the right side and 2+ on the left side.      Patellar reflexes are 0 on the right side and 0 on the left side.      Achilles reflexes are 0  on the right side and 0 on the left side. Patient has healed scars from bilateral knee replacements.  Sensation reduced below the knees to pinprick, although he can identify which toe is touched.  There is evidence of foot intrinsic atrophy bilaterally.  Psychiatric: He has a normal mood and affect.  Nursing note and vitals reviewed.         Assessment & Plan:  1. Bilateral shoulder pain. A critical left, history of osteonecrosis with shoulder contracture  Given history of osteonecrosis would avoid further corticosteroid injections. We discussed treatment options including medication management, as well as injections. Would recommend right suprascapular nerve block, if this has good short-term relief, would recommend cryoablation  We may consider this for the left side as well. Depending on results from right suprascapular nerve block.  2. Chronic multifactorial pain, diffuse osteoarthritis, most severe in the left hip. Trial of tramadol 50 mg twice a day, if this is not helpful, consider Tylenol with codeine

## 2016-12-11 ENCOUNTER — Telehealth: Payer: Self-pay | Admitting: Internal Medicine

## 2016-12-11 NOTE — Telephone Encounter (Signed)
Pharmacy called on behalf of patient requesting a 90 day supply, please advise pharmacy   [id number] - [place], [place] [address] [phone] (Phone) [phone] (Fax)

## 2016-12-11 NOTE — Telephone Encounter (Signed)
Medication Detail    Disp Refills Start End   XARELTO 20 MG TABS tablet 90 tablet 0 12/04/2016    Sig - Route: TAKE 1 TABLET (20 MG TOTAL) BY MOUTH DAILY WITH SUPPER. - Oral   E-Prescribing Status: Receipt confirmed by pharmacy (12/04/2016 12:18 PM EDT)

## 2016-12-11 NOTE — Telephone Encounter (Signed)
[facility]/2016 #90 and 0RF.

## 2016-12-11 NOTE — Telephone Encounter (Signed)
°  Relation to GK:KDPT  Call back number:[phone] Pharmacy: [id number] - [place], [place]  Reason for call:  Patient requesting XARELTO 20 MG TABS tablet

## 2016-12-23 ENCOUNTER — Ambulatory Visit (HOSPITAL_BASED_OUTPATIENT_CLINIC_OR_DEPARTMENT_OTHER)
Admission: RE | Admit: 2016-12-23 | Discharge: 2016-12-23 | Disposition: A | Discharge status: Home / Self Care | Payer: Medicare Other | Source: Ambulatory Visit | Attending: Hematology & Oncology | Admitting: Hematology & Oncology

## 2016-12-23 ENCOUNTER — Ambulatory Visit (HOSPITAL_BASED_OUTPATIENT_CLINIC_OR_DEPARTMENT_OTHER): Payer: Medicare Other | Admitting: Hematology & Oncology

## 2016-12-23 ENCOUNTER — Other Ambulatory Visit (HOSPITAL_BASED_OUTPATIENT_CLINIC_OR_DEPARTMENT_OTHER): Payer: Medicare Other

## 2016-12-23 VITALS — BP 113/65 | HR 55 | Temp 98.0°F | Resp 20

## 2016-12-23 DIAGNOSIS — I82512 Chronic embolism and thrombosis of left femoral vein: Secondary | ICD-10-CM

## 2016-12-23 DIAGNOSIS — D6862 Lupus anticoagulant syndrome: Secondary | ICD-10-CM | POA: Diagnosis not present

## 2016-12-23 DIAGNOSIS — I82412 Acute embolism and thrombosis of left femoral vein: Secondary | ICD-10-CM | POA: Diagnosis not present

## 2016-12-23 DIAGNOSIS — B3749 Other urogenital candidiasis: Secondary | ICD-10-CM

## 2016-12-23 DIAGNOSIS — M7989 Other specified soft tissue disorders: Secondary | ICD-10-CM | POA: Diagnosis not present

## 2016-12-23 DIAGNOSIS — Z7901 Long term (current) use of anticoagulants: Secondary | ICD-10-CM | POA: Diagnosis not present

## 2016-12-23 DIAGNOSIS — R609 Edema, unspecified: Secondary | ICD-10-CM | POA: Diagnosis not present

## 2016-12-23 DIAGNOSIS — R1030 Lower abdominal pain, unspecified: Secondary | ICD-10-CM

## 2016-12-23 LAB — CBC WITH DIFFERENTIAL (CANCER CENTER ONLY)
BASO#: 0 10*3/uL (ref 0.0–0.2)
BASO%: 0.2 % (ref 0.0–2.0)
EOS%: 1 % (ref 0.0–7.0)
Eosinophils Absolute: 0.1 10*3/uL (ref 0.0–0.5)
HEMATOCRIT: 39.1 % (ref 38.7–49.9)
HGB: 13.3 g/dL (ref 13.0–17.1)
LYMPH#: 1.5 10*3/uL (ref 0.9–3.3)
LYMPH%: 25.7 % (ref 14.0–48.0)
MCH: 31.8 pg (ref 28.0–33.4)
MCHC: 34 g/dL (ref 32.0–35.9)
MCV: 94 fL (ref 82–98)
MONO#: 0.6 10*3/uL (ref 0.1–0.9)
MONO%: 10.9 % (ref 0.0–13.0)
NEUT#: 3.6 10*3/uL (ref 1.5–6.5)
NEUT%: 62.2 % (ref 40.0–80.0)
Platelets: 148 10*3/uL (ref 145–400)
RBC: 4.18 10*6/uL — ABNORMAL LOW (ref 4.20–5.70)
RDW: 14.2 % (ref 11.1–15.7)
WBC: 5.8 10*3/uL (ref 4.0–10.0)

## 2016-12-23 LAB — CMP (CANCER CENTER ONLY)
ALT: 21 U/L (ref 10–47)
AST: 28 U/L (ref 11–38)
Albumin: 2.8 g/dL — ABNORMAL LOW (ref 3.3–5.5)
Alkaline Phosphatase: 75 U/L (ref 26–84)
BUN: 18 mg/dL (ref 7–22)
CALCIUM: 8.8 mg/dL (ref 8.0–10.3)
CHLORIDE: 103 meq/L (ref 98–108)
CO2: 30 meq/L (ref 18–33)
Creat: 0.7 mg/dl (ref 0.6–1.2)
GLUCOSE: 121 mg/dL — AB (ref 73–118)
POTASSIUM: 4 meq/L (ref 3.3–4.7)
Sodium: 139 mEq/L (ref 128–145)
Total Bilirubin: 0.8 mg/dl (ref 0.20–1.60)
Total Protein: 6.9 g/dL (ref 6.4–8.1)

## 2016-12-23 MED ORDER — FLUCONAZOLE 100 MG PO TABS: 100.0000 mg | ORAL_TABLET | Freq: Every day | ORAL | 0 refills | Status: DC

## 2016-12-23 NOTE — Progress Notes (Signed)
Hematology and Oncology Follow Up Visit  Shaun Yoder [id number] Dec 18, 1945 71 y.o. 12/23/2016   Principle Diagnosis:   DVT of the left femoral vein  Lupus anticoagulant positive  Current Therapy:    Xarelto 20 mg by mouth daily-lifelong  EC ASA 81 mg po q day - start 12/23/2016     Interim History:  Shaun Yoder is back for follow-up. He comes in a wheelchair. His legs are wrapped. He has really bad peripheral edema.  He has horrible arthritis.  He is worried about having a clot in his left leg. He says that he has pain in the left inguinal area. He says this pain happens when he has a thrombus. He has a partial thrombus in the left femoral vein with his last Doppler test.  He is lupus anticoagulant positive. He tested positive with his last visit. We will go ahead and have him also take aspirin at 81 mg a day.  He has terrible arthritis. He gets injections all the time.  Overall, I was advised performance status is ECOG 3.  Medications:  Current Outpatient Prescriptions:  .  Abatacept 125 MG/ML SOSY, Inject into the skin once a week. Reported on 10/24/2015, Disp: , Rfl:  .  ARIPiprazole (ABILIFY) 30 MG tablet, Take 30 mg by mouth at bedtime.  , Disp: , Rfl:  .  calcium-vitamin D (CALCIUM 500/D) 500-200 MG-UNIT tablet, Take by mouth., Disp: , Rfl:  .  divalproex (DEPAKOTE ER) 500 MG 24 hr tablet, 4 tabs at bedtime, Disp: , Rfl:  .  folic acid (FOLVITE) 1 MG tablet, Take 1 mg by mouth daily.  , Disp: , Rfl:  .  gabapentin (NEURONTIN) 300 MG capsule, Take 300 mg by mouth 2 (two) times daily. , Disp: , Rfl:  .  methotrexate (RHEUMATREX) 2.5 MG tablet, Take 17.5 mg by mouth once a week. Reported on 12/06/2015, Disp: , Rfl:  .  metoprolol succinate (TOPROL-XL) 25 MG 24 hr tablet, Take 12.5 mg by mouth daily., Disp: , Rfl:  .  mirabegron ER (MYRBETRIQ) 50 MG TB24 tablet, Take 50 mg by mouth daily., Disp: , Rfl:  .  Multiple Vitamins-Minerals (ZINC PO), Take by mouth daily.,  Disp: , Rfl:  .  Omega-3 1000 MG CAPS, Take by mouth., Disp: , Rfl:  .  omeprazole (PRILOSEC) 20 MG capsule, Take 20 mg by mouth every morning. , Disp: , Rfl:  .  oxybutynin (DITROPAN) 5 MG tablet, Take 5 mg by mouth 2 (two) times daily., Disp: , Rfl:  .  traMADol (ULTRAM) 50 MG tablet, Take 1 tablet (50 mg total) by mouth every 12 (twelve) hours as needed., Disp: 60 tablet, Rfl: 1 .  vitamin C (ASCORBIC ACID) 500 MG tablet, Take 500 mg by mouth daily., Disp: , Rfl:  .  XARELTO 20 MG TABS tablet, TAKE 1 TABLET (20 MG TOTAL) BY MOUTH DAILY WITH SUPPER., Disp: 90 tablet, Rfl: 0 .  ziprasidone (GEODON) 60 MG capsule, Take 120 mg by mouth daily. , Disp: , Rfl:   Allergies:  Allergies  Allergen Reactions  . Leflunomide Other (See Comments)    diarrhea  . Morphine And Related Nausea And Vomiting  . Plaquenil [Hydroxychloroquine Sulfate]     rash    Past Medical History, Surgical history, Social history, and Family History were reviewed and updated.  Review of Systems: As above  Physical Exam:  oral temperature is 98 F (36.7 C). His blood pressure is 113/65 and his pulse is 55 (abnormal).  His respiration is 20 and oxygen saturation is 95%.   Wt Readings from Last 3 Encounters:  10/01/16 214 lb (97.1 kg)  06/20/16 207 lb 12.8 oz (94.3 kg)  06/03/16 213 lb 8 oz (96.8 kg)     Head and neck exam shows no ocular or oral lesions. There are no palpable cervical or supraclavicular lymph nodes. His lungs are with some slight decrease at the bases. Cardiac exam regular in rhythm. He has a 4/6 systolic ejection murmur consistent with his aortic stenosis. Abdomen is soft. He is moderately obese. There is no fluid wave. There is no palpable liver or spleen tip. Extremities shows wrappings around both legs. He has obvious venous stasis dermatitis changes in both legs below the knees. He has arthritic deformities of his hands. Skin exam shows the stasis dermatitis changes in his lower legs. I see no  ecchymoses. He has no petechia. Neurological exam shows no obvious neurological deficits.  Lab Results  Component Value Date   WBC 5.8 12/23/2016   HGB 13.3 12/23/2016   HCT 39.1 12/23/2016   MCV 94 12/23/2016   PLT 148 12/23/2016     Chemistry      Component Value Date/Time   NA 139 12/23/2016 1516   K 4.0 12/23/2016 1516   CL 103 12/23/2016 1516   CO2 30 12/23/2016 1516   BUN 18 12/23/2016 1516   CREATININE 0.7 12/23/2016 1516   GLU 72 11/10/2012      Component Value Date/Time   CALCIUM 8.8 12/23/2016 1516   ALKPHOS 75 12/23/2016 1516   AST 28 12/23/2016 1516   ALT 21 12/23/2016 1516   BILITOT 0.80 12/23/2016 1516         Impression and Plan: Shaun Yoder is a 71 year old white male. He has multiple, multiple health issues. He has a history of thromboembolic disease. Again, I don't think this is really going to be a big issue for him.  We will see what the repeat Doppler shows. Again, I would believe that he has a thrombus in the leg. I would not think that this would be any worse.  We will plan to get him back in about 5 or 6 weeks.  I just feel bad for him given that he is so incapacitated by his arthritis.   [name], MD 6/4/20183:59 PM

## 2016-12-24 ENCOUNTER — Other Ambulatory Visit (HOSPITAL_BASED_OUTPATIENT_CLINIC_OR_DEPARTMENT_OTHER): Payer: Medicare Other

## 2016-12-24 ENCOUNTER — Telehealth: Payer: Self-pay | Admitting: *Deleted

## 2016-12-24 NOTE — Telephone Encounter (Addendum)
Patient is aware of results  ----- Message from [name], MD sent at 12/24/2016  7:44 AM EDT ----- Call - No blood clot in left leg!!  pete

## 2016-12-26 LAB — LUPUS ANTICOAGULANT PANEL
DRVVT MIX: 77 s — AB (ref 0.0–47.0)
DRVVT: 158.4 s — AB (ref 0.0–47.0)
Hexagonal Phase Phospholipid: 12 s — ABNORMAL HIGH (ref 0–11)
PTT-LA Mix: 60.2 s — ABNORMAL HIGH (ref 0.0–48.9)
PTT-LA: 75.2 s — ABNORMAL HIGH (ref 0.0–51.9)
dRVVT Confirm: 2 ratio — ABNORMAL HIGH (ref 0.8–1.2)

## 2017-01-01 ENCOUNTER — Ambulatory Visit (INDEPENDENT_AMBULATORY_CARE_PROVIDER_SITE_OTHER): Payer: Medicare Other | Admitting: Family Medicine

## 2017-01-01 ENCOUNTER — Encounter: Payer: Self-pay | Admitting: Family Medicine

## 2017-01-01 VITALS — BP 110/70 | HR 65 | Temp 99.1°F | Wt 214.0 lb

## 2017-01-01 DIAGNOSIS — H1132 Conjunctival hemorrhage, left eye: Secondary | ICD-10-CM | POA: Diagnosis not present

## 2017-01-01 DIAGNOSIS — N481 Balanitis: Secondary | ICD-10-CM | POA: Diagnosis not present

## 2017-01-01 DIAGNOSIS — H1033 Unspecified acute conjunctivitis, bilateral: Secondary | ICD-10-CM

## 2017-01-01 DIAGNOSIS — L219 Seborrheic dermatitis, unspecified: Secondary | ICD-10-CM | POA: Diagnosis not present

## 2017-01-01 MED ORDER — HYDROCORTISONE 1 % EX CREA: TOPICAL_CREAM | CUTANEOUS | 1 refills | Status: DC

## 2017-01-01 MED ORDER — POLYMYXIN B-TRIMETHOPRIM 10000-0.1 UNIT/ML-% OP SOLN: 1.0000 [drp] | OPHTHALMIC | 0 refills | Status: DC

## 2017-01-01 MED ORDER — CLOTRIMAZOLE 1 % EX CREA: 1.0000 "application " | TOPICAL_CREAM | Freq: Two times a day (BID) | CUTANEOUS | 0 refills | Status: DC

## 2017-01-01 MED ORDER — KETOCONAZOLE 2 % EX SHAM: MEDICATED_SHAMPOO | CUTANEOUS | 0 refills | Status: DC

## 2017-01-01 NOTE — Progress Notes (Signed)
Chief Complaint  Patient presents with  . Rash    on penis and back of neck and ears-x 2-2 1/2 weeks  . Eye Problem    pus, and reddness-x 1-1/2 weeks    Shaun Yoder is here for bilateral eye irritation.  Duration: 10 days  Some redness, b/l drainage in each eye Chemical exposure? No  Recent URI? No  Contact lenses? No  History of allergies? No   Treatment to date: artificial tears  1 mo of a rash on the penis and on neck. [facility]. He had a cream for yeast that did not work. He was given PO diflucan that was not helpful. He has been using a plastic urinal at home and sometimes the plastic will rub against his penis, which he believes started his issue. He is not having any urinary issues.  The patient is also having a scaly and sometimes itchy rash. It was on his face but also shows up behind his ears and on his scalp. No pain or spreading redness. He uses Head and Shoulders to no avail. He was given Hydrocortisone Valerate 0.2% by his dermatologist 2 mo ago, but states it has not worked (this timeline doesn't make sense with the tempo of his hx). He did not schedule a f/u with the specialist.   ROS:  Eyes: As noted above Skin: As noted in HPI  Past Medical History:  Diagnosis Date  . Anemia   . Cancer ([place])    bladder  . Colitis   . Colon polyps   . Depression   . Diverticulosis   . DVT of deep femoral vein ([place])   . Gastritis   . GERD (gastroesophageal reflux disease)   . History of rectal polyps   . Hyperlipidemia   . Hypertension   . Iron deficiency   . Low back pain   . OA (osteoarthritis)   . Osteonecrosis of shoulder region ([place])   . Schizoaffective disorder, bipolar type ([place])   . Sleep apnea   . Varicose vein    Family History  Problem Relation Age of Onset  . Heart attack Mother   . Heart attack Father   . Stroke Father   . Rheumatologic disease Neg Hx   . Colon cancer Neg Hx   . Lung disease Neg Hx   . Prostate cancer Neg Hx      BP 110/70 (BP Location: Left Arm, Patient Position: Sitting, Cuff Size: Normal)   Pulse 65   Temp 99.1 F (37.3 C) (Oral)   Wt 214 lb (97.1 kg) Comment: Per patient  SpO2 91%   BMI 40.43 kg/m  Gen: Awake, alert, appears stated age Eyes: Lids neg, Sclera injected b/l, hemorrhage appreciated on L that spares limbus, b/l discharge appreciated, PERRLA, EOMi Nose: Nares patent without discharge Skin: scaly patches with mildly erythematous bases on scalp and posterior to ears. There is blotchy and erythematous lesion on penis affected head and distal shaft. There is some discharge appreciated. No discharge from penis or open lesions.  Psych: Age appropriate judgment and insight; mood and affect normal  Seborrheic dermatitis - Plan: ketoconazole (NIZORAL) 2 % shampoo, hydrocortisone cream 1 %  Balanitis - Plan: clotrimazole (LOTRIMIN) 1 % cream  Acute conjunctivitis of both eyes, unspecified acute conjunctivitis type - Plan: trimethoprim-polymyxin b (POLYTRIM) ophthalmic solution  Subconjunctival hematoma, left  Orders as above. Clotrimazole on penis for 7 days, if no improvement, stop and use the hydrocortisone on it twice daily  until follow up with derm. He was encouraged to f/u with derm.  Instructed to practice good hand hygiene and try not to touch face. Warm compresses and artificial tears also recommended. F/u if no improvement in 14 days with reg PCP Dr. Larose Kells. Pt and his wife voiced understanding and agreement to the plan.  [place], DO 01/01/17 4:06 PM

## 2017-01-01 NOTE — Patient Instructions (Addendum)
Schedule appt with your dermatologist for follow up in 2-3 weeks. Cancel if you're doing better.  If the skin on your penis does not improve after 1 week of using the clotrimazole, use the hydrocortisone that I am calling in twice daily for 10 days.  Temporarily stop the cream your dermatologist gave you until you follow up.  Use warm compresses and artificial tears.  Cancel appointment with Dr. Larose Kells if your eyes are getting better.

## 2017-01-05 ENCOUNTER — Encounter (HOSPITAL_BASED_OUTPATIENT_CLINIC_OR_DEPARTMENT_OTHER): Payer: Self-pay | Admitting: *Deleted

## 2017-01-05 ENCOUNTER — Emergency Department (HOSPITAL_BASED_OUTPATIENT_CLINIC_OR_DEPARTMENT_OTHER): Payer: Medicare Other

## 2017-01-05 ENCOUNTER — Inpatient Hospital Stay (HOSPITAL_BASED_OUTPATIENT_CLINIC_OR_DEPARTMENT_OTHER)
Admission: EM | Admit: 2017-01-05 | Discharge: 2017-01-08 | DRG: 603 | Disposition: A | Discharge status: Home / Self Care | Payer: Medicare Other | Attending: Internal Medicine | Admitting: Internal Medicine

## 2017-01-05 DIAGNOSIS — S51812A Laceration without foreign body of left forearm, initial encounter: Secondary | ICD-10-CM | POA: Diagnosis present

## 2017-01-05 DIAGNOSIS — Z7901 Long term (current) use of anticoagulants: Secondary | ICD-10-CM

## 2017-01-05 DIAGNOSIS — Z888 Allergy status to other drugs, medicaments and biological substances status: Secondary | ICD-10-CM

## 2017-01-05 DIAGNOSIS — F250 Schizoaffective disorder, bipolar type: Secondary | ICD-10-CM | POA: Diagnosis present

## 2017-01-05 DIAGNOSIS — G4733 Obstructive sleep apnea (adult) (pediatric): Secondary | ICD-10-CM | POA: Diagnosis present

## 2017-01-05 DIAGNOSIS — R609 Edema, unspecified: Secondary | ICD-10-CM

## 2017-01-05 DIAGNOSIS — Z79899 Other long term (current) drug therapy: Secondary | ICD-10-CM

## 2017-01-05 DIAGNOSIS — L03114 Cellulitis of left upper limb: Secondary | ICD-10-CM | POA: Diagnosis not present

## 2017-01-05 DIAGNOSIS — K219 Gastro-esophageal reflux disease without esophagitis: Secondary | ICD-10-CM | POA: Diagnosis present

## 2017-01-05 DIAGNOSIS — R0902 Hypoxemia: Secondary | ICD-10-CM | POA: Diagnosis present

## 2017-01-05 DIAGNOSIS — Z885 Allergy status to narcotic agent status: Secondary | ICD-10-CM

## 2017-01-05 DIAGNOSIS — Z993 Dependence on wheelchair: Secondary | ICD-10-CM

## 2017-01-05 DIAGNOSIS — M7989 Other specified soft tissue disorders: Secondary | ICD-10-CM | POA: Diagnosis not present

## 2017-01-05 DIAGNOSIS — I10 Essential (primary) hypertension: Secondary | ICD-10-CM | POA: Diagnosis present

## 2017-01-05 DIAGNOSIS — M064 Inflammatory polyarthropathy: Secondary | ICD-10-CM | POA: Diagnosis not present

## 2017-01-05 DIAGNOSIS — Z87891 Personal history of nicotine dependence: Secondary | ICD-10-CM

## 2017-01-05 DIAGNOSIS — Z7982 Long term (current) use of aspirin: Secondary | ICD-10-CM

## 2017-01-05 DIAGNOSIS — M069 Rheumatoid arthritis, unspecified: Secondary | ICD-10-CM | POA: Diagnosis present

## 2017-01-05 DIAGNOSIS — R52 Pain, unspecified: Secondary | ICD-10-CM

## 2017-01-05 DIAGNOSIS — I82402 Acute embolism and thrombosis of unspecified deep veins of left lower extremity: Secondary | ICD-10-CM | POA: Diagnosis present

## 2017-01-05 DIAGNOSIS — Z8551 Personal history of malignant neoplasm of bladder: Secondary | ICD-10-CM

## 2017-01-05 DIAGNOSIS — R627 Adult failure to thrive: Secondary | ICD-10-CM | POA: Diagnosis present

## 2017-01-05 DIAGNOSIS — M79622 Pain in left upper arm: Secondary | ICD-10-CM | POA: Diagnosis not present

## 2017-01-05 DIAGNOSIS — Z96653 Presence of artificial knee joint, bilateral: Secondary | ICD-10-CM | POA: Diagnosis present

## 2017-01-05 DIAGNOSIS — L0390 Cellulitis, unspecified: Secondary | ICD-10-CM | POA: Diagnosis present

## 2017-01-05 DIAGNOSIS — Z86718 Personal history of other venous thrombosis and embolism: Secondary | ICD-10-CM | POA: Diagnosis not present

## 2017-01-05 DIAGNOSIS — M1612 Unilateral primary osteoarthritis, left hip: Secondary | ICD-10-CM | POA: Diagnosis present

## 2017-01-05 DIAGNOSIS — I350 Nonrheumatic aortic (valve) stenosis: Secondary | ICD-10-CM | POA: Diagnosis present

## 2017-01-05 LAB — COMPREHENSIVE METABOLIC PANEL
ALT: 12 U/L — ABNORMAL LOW (ref 17–63)
AST: 26 U/L (ref 15–41)
Albumin: 2.8 g/dL — ABNORMAL LOW (ref 3.5–5.0)
Alkaline Phosphatase: 74 U/L (ref 38–126)
Anion gap: 7 (ref 5–15)
BUN: 22 mg/dL — AB (ref 6–20)
CHLORIDE: 100 mmol/L — AB (ref 101–111)
CO2: 31 mmol/L (ref 22–32)
Calcium: 8.7 mg/dL — ABNORMAL LOW (ref 8.9–10.3)
Creatinine, Ser: 0.71 mg/dL (ref 0.61–1.24)
GFR calc Af Amer: >60 mL/min (ref >60)
Glucose, Bld: 92 mg/dL (ref 65–99)
POTASSIUM: 3.9 mmol/L (ref 3.5–5.1)
Sodium: 138 mmol/L (ref 135–145)
Total Bilirubin: 1 mg/dL (ref 0.3–1.2)
Total Protein: 7.1 g/dL (ref 6.5–8.1)

## 2017-01-05 LAB — CBC WITH DIFFERENTIAL/PLATELET
BASOS ABS: 0 10*3/uL (ref 0.0–0.1)
BASOS PCT: 0 %
EOS ABS: 0 10*3/uL (ref 0.0–0.7)
EOS PCT: 1 %
HCT: 39.5 % (ref 39.0–52.0)
Hemoglobin: 13.5 g/dL (ref 13.0–17.0)
LYMPHS ABS: 2 10*3/uL (ref 0.7–4.0)
Lymphocytes Relative: 33 %
MCH: 31.8 pg (ref 26.0–34.0)
MCHC: 34.2 g/dL (ref 30.0–36.0)
MCV: 92.9 fL (ref 78.0–100.0)
Monocytes Absolute: 0.6 10*3/uL (ref 0.1–1.0)
Monocytes Relative: 11 %
Neutro Abs: 3.3 10*3/uL (ref 1.7–7.7)
Neutrophils Relative %: 55 %
PLATELETS: 140 10*3/uL — AB (ref 150–400)
RBC: 4.25 MIL/uL (ref 4.22–5.81)
RDW: 14.6 % (ref 11.5–15.5)
WBC: 6 10*3/uL (ref 4.0–10.5)

## 2017-01-05 LAB — I-STAT CG4 LACTIC ACID, ED: LACTIC ACID, VENOUS: 1.1 mmol/L (ref 0.5–1.9)

## 2017-01-05 MED ORDER — CLINDAMYCIN PHOSPHATE 900 MG/50ML IV SOLN
900.0000 mg | Freq: Once | INTRAVENOUS | Status: AC
  Administered 2017-01-05: 900 mg via INTRAVENOUS
  Filled 2017-01-05: qty 50

## 2017-01-05 NOTE — ED Notes (Signed)
Pt placed on O2@3L  and sats increased to 94%. RN and RRT notified.

## 2017-01-05 NOTE — ED Provider Notes (Signed)
[facility] DEPT [facility] Provider Note   CSN: [id number] Arrival date & time: 01/05/17  1754  By signing my name below, I, Shaun Yoder, attest that this documentation has been prepared under the direction and in the presence of [name], PA-C. Electronically Signed: Hansel Yoder, ED Scribe. 01/05/17. 7:38 PM.    History   Chief Complaint Chief Complaint  Patient presents with  . Arm Swelling    HPI Shaun Yoder is a 71 y.o. male who presents to the Emergency Department complaining of gradually worsening left hand pain, redness and swelling that began 2 days ago. Pt states he woke up with his symptoms, and denies any recent falls. He is unsure if there was a spider bite or other insect bite to the area. He also states that the swelling began with a blister on his upper arm that popped spontaneously.[name] He states his pain is worsened with movement and unrelieved with gabapentin or Tylenol #3. Pt states his hands occasionally swell secondary to RA, but reports that his current symptoms are not similar. No h/o of similar symptoms. Pt reports h/o DVT to the left groin 8 months ago, for which he is taking Xarelto and 81 mg ASA. No h/o gout, DM, osteomyelitis. Pt is eating and drinking without difficulty. Pt is followed by the [facility] in [place]. He denies numbness, SOB, CP, hemoptysis, fever, nausea, vomiting, leg swelling, vision changes, head injury.   The history is provided by the patient. No language interpreter was used.    Past Medical History:  Diagnosis Date  . Anemia   . Cancer ([place])    bladder  . Colitis   . Colon polyps   . Depression   . Diverticulosis   . DVT of deep femoral vein ([place])   . Gastritis   . GERD (gastroesophageal reflux disease)   . History of rectal polyps   . Hyperlipidemia   . Hypertension   . Iron deficiency   . Low back pain   . OA (osteoarthritis)   . 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 Schizoaffective disorder, bipolar type ([place])   .  Sleep apnea   . Varicose vein     Patient Active Problem List   Diagnosis Date Noted  . Cellulitis 01/06/2017  . Primary osteoarthritis of left hip 12/06/2016  . Chronic right shoulder pain 12/06/2016  . Contracture of joint of right shoulder region 12/06/2016  . Chronic left hip pain 09/12/2016  . Status post bilateral knee replacements 09/12/2016  . Gait disorder 06/03/2016  . Left leg DVT ([place]) 04/04/2016  . Non-pressure chronic ulcer of right ankle with fat layer exposed ([place]) 10/25/2015  . Varicose veins of lower extremities with ulcer ([place]) 09/07/2015  . Annual physical exam 05/23/2015  . PCP NOTES >>>>>>>>>>>>>>>>> 03/28/2015  . Hypertension 03/28/2015  . Pulmonary nodules 12/09/2014  . Sleep apnea 11/14/2014  . GERD (gastroesophageal reflux disease) 11/14/2014  . Morbid obesity ([place]) 11/14/2014  . Aortic stenosis, severe   . Schizoaffective disorder, bipolar type ([place])   . Rheumatoid arthritis ([place])   . Hyperlipidemia   . Bifasicular block   . Mononeuritis of upper limb 05/20/2013  . Pain in soft tissues of limb 05/20/2013  . 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 Depression 01/13/2013  . Memory loss 01/13/2013    Past Surgical History:  Procedure Laterality Date  . BLADDER SURGERY    . TOTAL KNEE ARTHROPLASTY Bilateral   . VARICOSE VEIN SURGERY  Home Medications    Prior to Admission medications   Medication Sig Start Date End Date Taking? Authorizing Provider  Abatacept 125 MG/ML SOSY Inject into the skin once a week. Reported on 10/24/2015    [provider]  ARIPiprazole (ABILIFY) 30 MG tablet Take 30 mg by mouth at bedtime.      [provider]  aspirin EC 81 MG tablet Take 81 mg by mouth daily.    [provider]  calcium-vitamin D (CALCIUM 500/D) 500-200 MG-UNIT tablet Take by mouth.    [provider]  clotrimazole (LOTRIMIN) 1 % cream Apply 1 application topically 2 (two) times daily. 01/01/17 01/15/17   [name], DO  divalproex (DEPAKOTE ER) 500 MG 24 hr tablet 4 tabs at bedtime    [provider]  fluconazole (DIFLUCAN) 100 MG tablet Take 1 tablet (100 mg total) by mouth daily. 12/23/16   [name], MD  folic acid (FOLVITE) 1 MG tablet Take 1 mg by mouth daily.      [provider]  gabapentin (NEURONTIN) 300 MG capsule Take 300 mg by mouth 2 (two) times daily.     [provider]  hydrocortisone cream 1 % Apply on affected areas on face and behind ears twice daily for 14 days. 01/01/17   [name], DO  hydrocortisone valerate cream (WESTCORT) 0.2 % Apply 1 application topically 2 (two) times daily.    [provider]  ketoconazole (NIZORAL) 2 % shampoo Lather and massage into scalp 2-3 times per week. Leave for 5 minutes before rinsing. 01/01/17   [name], DO  methotrexate (RHEUMATREX) 2.5 MG tablet Take 17.5 mg by mouth once a week. Reported on 12/06/2015    [name], MD  metoprolol succinate (TOPROL-XL) 25 MG 24 hr tablet Take 12.5 mg by mouth daily.    [provider]  mirabegron ER (MYRBETRIQ) 50 MG TB24 tablet Take 50 mg by mouth daily.    [provider]  Multiple Vitamins-Minerals (ZINC PO) Take by mouth daily.    [provider]  Omega-3 1000 MG CAPS Take by mouth.    [provider]  omeprazole (PRILOSEC) 20 MG capsule Take 20 mg by mouth every morning.     [provider]  oxybutynin (DITROPAN) 5 MG tablet Take 5 mg by mouth 2 (two) times daily.    [provider]  traMADol (ULTRAM) 50 MG tablet Take 1 tablet (50 mg total) by mouth every 12 (twelve) hours as needed. 12/06/16   Kirsteins, [name], MD  trimethoprim-polymyxin b (POLYTRIM) ophthalmic solution Place 1 drop into both eyes every 4 (four) hours. 01/01/17   [name], DO  vitamin C (ASCORBIC ACID) 500 MG tablet Take 500 mg by mouth daily.    [provider]  XARELTO  20 MG TABS tablet TAKE 1 TABLET (20 MG TOTAL) BY MOUTH DAILY WITH SUPPER. 12/04/16   Colon Branch, MD  ziprasidone (GEODON) 60 MG capsule Take 120 mg by mouth daily.     [provider]    Family History Family History  Problem Relation Age of Onset  . Heart attack Mother   . Heart attack Father   . Stroke Father   . Rheumatologic disease Neg Hx   . Colon cancer Neg Hx   . Lung disease Neg Hx   . Prostate cancer Neg Hx     Social History Social History  Substance Use Topics  . Smoking status: Former  Smoker    Packs/day: 2.50    Years: 43.00    Types: Cigarettes    Quit date: 07/22/1998  . Smokeless tobacco: Never Used  . Alcohol use 0.0 oz/week     Comment: Occasional glass of wine     Allergies   Leflunomide; Morphine and related; and Plaquenil [hydroxychloroquine sulfate]   Review of Systems Review of Systems  Constitutional: Negative for fever.  Respiratory: Negative for cough and shortness of breath.   Cardiovascular: Negative for chest pain.  Gastrointestinal: Negative for nausea and vomiting.  Musculoskeletal: Positive for arthralgias and joint swelling.  Skin: Positive for color change.  Neurological: Negative for numbness.  All other systems reviewed and are negative.    Physical Exam Updated Vital Signs BP 138/78   Pulse 67   Temp 98.2 F (36.8 C)   Resp 14   SpO2 97%   Physical Exam  Constitutional: He appears well-developed and well-nourished. No distress.  HENT:  Head: Normocephalic and atraumatic.  Nose: Nose normal.  Eyes: Conjunctivae and EOM are normal. Left eye exhibits no discharge. No scleral icterus.  Neck: Normal range of motion. Neck supple.  Cardiovascular: Normal rate, regular rhythm and intact distal pulses.  Exam reveals no gallop and no friction rub.   Murmur heard. Radial pulses 2+  Pulmonary/Chest: Effort normal and breath sounds normal. No respiratory distress. He has no wheezes. He has no rales.  Abdominal: Soft.  Bowel sounds are normal. He exhibits no distension. There is no tenderness. There is no guarding.  Musculoskeletal: Normal range of motion. He exhibits edema and tenderness.  Tenderness over the MCP joints and wrist of the left upper extremity. Mild warmth to the area. Pain with flexion and extension of the digits.   Neurological: He is alert. He exhibits normal muscle tone. Coordination normal.  Sensation intact to light touch of the left hand and arm.   Skin: Skin is warm and dry. No rash noted. He is not diaphoretic. There is erythema.  Psychiatric: He has a normal mood and affect.  Nursing note and vitals reviewed.      ED Treatments / Results   COORDINATION OF CARE: 7:28 PM Discussed treatment plan with pt at bedside which includes XR, [place] and pt agreed to plan.    Labs (all labs ordered are listed, but only abnormal results are displayed) Labs Reviewed  CBC WITH DIFFERENTIAL/PLATELET - Abnormal; Notable for the following:       Result Value   Platelets 140 (*)    All other components within normal limits  COMPREHENSIVE METABOLIC PANEL - Abnormal; Notable for the following:    Chloride 100 (*)    BUN 22 (*)    Calcium 8.7 (*)    Albumin 2.8 (*)    ALT 12 (*)    All other components within normal limits  CULTURE, BLOOD (ROUTINE X 2)  CULTURE, BLOOD (ROUTINE X 2)  URINALYSIS, ROUTINE W REFLEX MICROSCOPIC  I-STAT CG4 LACTIC ACID, ED    EKG  EKG Interpretation None       Radiology Dg Wrist Complete Left  Result Date: 01/05/2017 CLINICAL DATA:  Acute onset of left hand and wrist pain, swelling and redness for 2 days. History of rheumatoid arthritis. EXAM: LEFT WRIST - COMPLETE 3+ VIEW COMPARISON:  Bilateral hand radiographs 07/05/2010 FINDINGS: Progressive erosions involving the distal radius primarily involving the radial styloid. Erosions involving the distal ulna appear similar to prior exam. Interval remottling of the distal radioulnar joint with loss of  normal  scapholunate interval and settling of the carpals. Multifocal cystic change versus erosions involving the carpal bones, likely progressed from prior, direct comparison difficult due to differences in technique. No evidence of acute fracture. There is diffuse soft tissue edema about the wrist. Vascular calcifications are seen. IMPRESSION: 1. Multifocal erosions involving the distal radius and ulna, progressed from 2011 (most recent comparison exam) with remottling of the distal radioulnar joint. Findings suggest sequela of rheumatoid arthritis, difficult to exclude acute infection given the soft tissue edema. 2. Chronic loss of normal scapholunate interval in settling of the carpals. 3. Multifocal cystic change versus erosions throughout the carpal bones, with mild progression from prior exam. Electronically Signed   By: Jeb Levering M.D.   On: 01/05/2017 22:36   Dg Hand 2 View Left  Result Date: 01/05/2017 CLINICAL DATA:  Acute onset of left hand and wrist pain, swelling and redness for 2 days. History rheumatoid arthritis. EXAM: LEFT HAND - 2 VIEW COMPARISON:  Bilateral hand radiographs 07/05/2010 FINDINGS: Multifocal erosions throughout the left hand and wrist. Progressive erosions and joint space narrowing of the second and third metacarpal phalangeal joints compared with radiographs from 2011. Erosive change involving carpal bones and wrist are better assessed on concurrent wrist radiographs. There is mild osteoarthritis of the thumb interphalangeal joint. Second digit held in flexion at the distal interphalangeal joint limiting assessment. Diffuse soft tissue edema about the hand and wrist. No evidence of soft tissue air. Vascular calcifications are seen. IMPRESSION: Progressive inflammatory arthritis involving the second and third metacarpal phalangeal joints from 2011 exam (most recent comparison). Findings likely represent progression of rheumatoid arthritis, however superimposed infection difficult  to exclude given the diffuse soft tissue edema. Wrist findings better assessed on concurrent wrist series. Electronically Signed   By: Jeb Levering M.D.   On: 01/05/2017 22:32   US Venous Img Upper Uni Left  Result Date: 01/05/2017 CLINICAL DATA:  Left forearm swelling. EXAM: LEFT UPPER EXTREMITY VENOUS DOPPLER ULTRASOUND TECHNIQUE: Gray-scale sonography with graded compression, as well as color Doppler and duplex ultrasound were performed to evaluate the upper extremity deep venous system from the level of the subclavian vein and including the jugular, axillary, basilic, radial, ulnar and upper cephalic vein. Spectral Doppler was utilized to evaluate flow at rest and with distal augmentation maneuvers. COMPARISON:  None. FINDINGS: Contralateral Subclavian Vein: Respiratory phasicity is normal and symmetric with the symptomatic side. No evidence of thrombus. Normal compressibility. Internal Jugular Vein: No evidence of thrombus. Normal compressibility, respiratory phasicity and response to augmentation. Subclavian Vein: No evidence of thrombus. Normal compressibility, respiratory phasicity and response to augmentation. Axillary Vein: No evidence of thrombus. Normal compressibility, respiratory phasicity and response to augmentation. Cephalic Vein: Normal compressibility. The response to augmentation could not be determined due to tenderness. Doppler flow could not be documented. Basilic Vein: No evidence of thrombus. Normal compressibility, respiratory phasicity and response to augmentation. Brachial Veins: No evidence of thrombus. Normal compressibility, respiratory phasicity and response to augmentation. Radial Veins: No evidence of thrombus. Normal compressibility, respiratory phasicity and response to augmentation. Ulnar Veins: No evidence of thrombus. Normal compressibility, respiratory phasicity and response to augmentation. Venous Reflux:  None visualized. Other Findings:  None visualized. IMPRESSION:  Doppler flow could not be documented within the cephalic vein, although it was normally compressible. Response to augmentation could not be assessed due to pain. Otherwise, no evidence of DVT in the left upper extremity. Electronically Signed   By: Fidela Salisbury M.D.   On: 01/05/2017  22:24    Procedures Procedures (including critical care time)  Medications Ordered in ED Medications  clindamycin (CLEOCIN) IVPB 900 mg (0 mg Intravenous Stopped 01/06/17 0022)     Initial Impression / Assessment and Plan / ED Course  I have reviewed the triage vital signs and the nursing notes.  Pertinent labs & imaging results that were available during my care of the patient were reviewed by me and considered in my medical decision making (see chart for details).    Patient presents to ED for complaints of left hand and arm pain, swelling that has gone progressively worse for the past 2 days. Patient has history of DVT of the lower extremity. He is currently on Xarelto and aspirin. He has unknown history of insect bite or trauma to the area. On exam area is swollen, tender to the touch and erythematous. There is mild temperature increase compared to the other arm. DVT study by ultrasound was negative. X-ray negative for fracture, dislocation. There are signs of rheumatoid arthritis which patient has a history of. Unable to rule out infection by x-ray findings. High suspicion for cellulitis. This has rapidly progressed in the past 2 days. No signs of septic joint present. Patient is afebrile with no history of fever. Lactic acid 1.1. WBC count normal at 6.0. Blood cultures were obtained. Lab work otherwise unremarkable. Patient denies chest pain, hemoptysis, shortness of breath. Patient was began on IV clindamycin here in the ED. Will admit to hospitalist for further evaluation and management. Spoke to Dr. Hal Hope, who will admit the patient.  Patient discussed with and seen by Dr. Leonette Monarch,  Final  Clinical Impressions(s) / ED Diagnoses   Final diagnoses:  Swelling    New Prescriptions New Prescriptions   No medications on file    I personally performed the services described in this documentation, which was scribed in my presence. The recorded information has been reviewed and is accurate.    Delia Heady, PA-C 01/06/17 0046    Fatima Blank, MD 01/08/17 [phone]

## 2017-01-05 NOTE — ED Triage Notes (Signed)
States he woke Friday morning with swelling in left hand. Swelling has gotten worse. Pt has ring stuck on left ring finger

## 2017-01-05 NOTE — ED Notes (Signed)
ED Provider at bedside. 

## 2017-01-05 NOTE — ED Notes (Signed)
Patient transported to Ultrasound 

## 2017-01-06 ENCOUNTER — Observation Stay (HOSPITAL_COMMUNITY): Payer: Medicare Other

## 2017-01-06 ENCOUNTER — Inpatient Hospital Stay (HOSPITAL_COMMUNITY): Payer: Medicare Other

## 2017-01-06 ENCOUNTER — Encounter (HOSPITAL_COMMUNITY): Payer: Self-pay | Admitting: Internal Medicine

## 2017-01-06 DIAGNOSIS — L0390 Cellulitis, unspecified: Secondary | ICD-10-CM | POA: Diagnosis present

## 2017-01-06 DIAGNOSIS — M25432 Effusion, left wrist: Secondary | ICD-10-CM | POA: Diagnosis not present

## 2017-01-06 DIAGNOSIS — Z885 Allergy status to narcotic agent status: Secondary | ICD-10-CM | POA: Diagnosis not present

## 2017-01-06 DIAGNOSIS — R627 Adult failure to thrive: Secondary | ICD-10-CM | POA: Diagnosis present

## 2017-01-06 DIAGNOSIS — M7989 Other specified soft tissue disorders: Secondary | ICD-10-CM | POA: Diagnosis present

## 2017-01-06 DIAGNOSIS — F250 Schizoaffective disorder, bipolar type: Secondary | ICD-10-CM | POA: Diagnosis present

## 2017-01-06 DIAGNOSIS — I350 Nonrheumatic aortic (valve) stenosis: Secondary | ICD-10-CM | POA: Diagnosis present

## 2017-01-06 DIAGNOSIS — Z96653 Presence of artificial knee joint, bilateral: Secondary | ICD-10-CM | POA: Diagnosis present

## 2017-01-06 DIAGNOSIS — Z87891 Personal history of nicotine dependence: Secondary | ICD-10-CM | POA: Diagnosis not present

## 2017-01-06 DIAGNOSIS — G4733 Obstructive sleep apnea (adult) (pediatric): Secondary | ICD-10-CM | POA: Diagnosis present

## 2017-01-06 DIAGNOSIS — M1612 Unilateral primary osteoarthritis, left hip: Secondary | ICD-10-CM | POA: Diagnosis present

## 2017-01-06 DIAGNOSIS — Z993 Dependence on wheelchair: Secondary | ICD-10-CM | POA: Diagnosis not present

## 2017-01-06 DIAGNOSIS — Z9225 Personal history of immunosupression therapy: Secondary | ICD-10-CM | POA: Diagnosis not present

## 2017-01-06 DIAGNOSIS — I10 Essential (primary) hypertension: Secondary | ICD-10-CM | POA: Diagnosis present

## 2017-01-06 DIAGNOSIS — Z7901 Long term (current) use of anticoagulants: Secondary | ICD-10-CM | POA: Diagnosis not present

## 2017-01-06 DIAGNOSIS — M25532 Pain in left wrist: Secondary | ICD-10-CM | POA: Diagnosis not present

## 2017-01-06 DIAGNOSIS — R0902 Hypoxemia: Secondary | ICD-10-CM | POA: Diagnosis present

## 2017-01-06 DIAGNOSIS — Z79899 Other long term (current) drug therapy: Secondary | ICD-10-CM | POA: Diagnosis not present

## 2017-01-06 DIAGNOSIS — Z86718 Personal history of other venous thrombosis and embolism: Secondary | ICD-10-CM | POA: Diagnosis not present

## 2017-01-06 DIAGNOSIS — M064 Inflammatory polyarthropathy: Secondary | ICD-10-CM | POA: Diagnosis present

## 2017-01-06 DIAGNOSIS — Z8739 Personal history of other diseases of the musculoskeletal system and connective tissue: Secondary | ICD-10-CM | POA: Diagnosis not present

## 2017-01-06 DIAGNOSIS — Z7982 Long term (current) use of aspirin: Secondary | ICD-10-CM | POA: Diagnosis not present

## 2017-01-06 DIAGNOSIS — Z8551 Personal history of malignant neoplasm of bladder: Secondary | ICD-10-CM | POA: Diagnosis not present

## 2017-01-06 DIAGNOSIS — Z888 Allergy status to other drugs, medicaments and biological substances status: Secondary | ICD-10-CM | POA: Diagnosis not present

## 2017-01-06 DIAGNOSIS — L03114 Cellulitis of left upper limb: Secondary | ICD-10-CM | POA: Diagnosis not present

## 2017-01-06 DIAGNOSIS — K219 Gastro-esophageal reflux disease without esophagitis: Secondary | ICD-10-CM | POA: Diagnosis present

## 2017-01-06 DIAGNOSIS — S51812A Laceration without foreign body of left forearm, initial encounter: Secondary | ICD-10-CM | POA: Diagnosis present

## 2017-01-06 LAB — URINALYSIS, ROUTINE W REFLEX MICROSCOPIC
GLUCOSE, UA: NEGATIVE mg/dL
Hgb urine dipstick: NEGATIVE
KETONES UR: 15 mg/dL — AB
Nitrite: NEGATIVE
PH: 5.5 (ref 5.0–8.0)
PROTEIN: NEGATIVE mg/dL
Specific Gravity, Urine: 1.029 (ref 1.005–1.030)

## 2017-01-06 LAB — HEPARIN LEVEL (UNFRACTIONATED): Heparin Unfractionated: 0.68 IU/mL (ref 0.30–0.70)

## 2017-01-06 LAB — URINALYSIS, MICROSCOPIC (REFLEX)

## 2017-01-06 LAB — CBC
HEMATOCRIT: 36.1 % — AB (ref 39.0–52.0)
Hemoglobin: 12.1 g/dL — ABNORMAL LOW (ref 13.0–17.0)
MCH: 30.5 pg (ref 26.0–34.0)
MCHC: 33.5 g/dL (ref 30.0–36.0)
MCV: 90.9 fL (ref 78.0–100.0)
Platelets: 128 10*3/uL — ABNORMAL LOW (ref 150–400)
RBC: 3.97 MIL/uL — ABNORMAL LOW (ref 4.22–5.81)
RDW: 14.4 % (ref 11.5–15.5)
WBC: 4.9 10*3/uL (ref 4.0–10.5)

## 2017-01-06 LAB — BASIC METABOLIC PANEL
Anion gap: 5 (ref 5–15)
BUN: 20 mg/dL (ref 6–20)
CHLORIDE: 102 mmol/L (ref 101–111)
CO2: 33 mmol/L — AB (ref 22–32)
Calcium: 8.5 mg/dL — ABNORMAL LOW (ref 8.9–10.3)
Creatinine, Ser: 0.57 mg/dL — ABNORMAL LOW (ref 0.61–1.24)
GFR calc Af Amer: >60 mL/min (ref >60)
GFR calc non Af Amer: >60 mL/min (ref >60)
GLUCOSE: 83 mg/dL (ref 65–99)
POTASSIUM: 3.8 mmol/L (ref 3.5–5.1)
Sodium: 140 mmol/L (ref 135–145)

## 2017-01-06 LAB — PROTIME-INR
INR: 1.29
Prothrombin Time: 16.2 seconds — ABNORMAL HIGH (ref 11.4–15.2)

## 2017-01-06 LAB — APTT: aPTT: 52 seconds — ABNORMAL HIGH (ref 24–36)

## 2017-01-06 LAB — MRSA PCR SCREENING: MRSA BY PCR: NEGATIVE

## 2017-01-06 MED ORDER — ONDANSETRON HCL 4 MG/2ML IJ SOLN: 4.0000 mg | Freq: Four times a day (QID) | INTRAMUSCULAR | Status: DC | PRN

## 2017-01-06 MED ORDER — MIRABEGRON ER 25 MG PO TB24
50.0000 mg | ORAL_TABLET | Freq: Every day | ORAL | Status: DC
  Administered 2017-01-06 – 2017-01-08 (×3): 50 mg via ORAL
  Filled 2017-01-06 (×3): qty 2

## 2017-01-06 MED ORDER — FOLIC ACID 1 MG PO TABS
1.0000 mg | ORAL_TABLET | Freq: Every day | ORAL | Status: DC
  Administered 2017-01-06 – 2017-01-08 (×3): 1 mg via ORAL
  Filled 2017-01-06 (×4): qty 1

## 2017-01-06 MED ORDER — VANCOMYCIN HCL IN DEXTROSE 1-5 GM/200ML-% IV SOLN
1000.0000 mg | Freq: Two times a day (BID) | INTRAVENOUS | Status: DC
  Administered 2017-01-06 – 2017-01-08 (×4): 1000 mg via INTRAVENOUS
  Filled 2017-01-06 (×4): qty 200

## 2017-01-06 MED ORDER — VITAMIN C 500 MG PO TABS
500.0000 mg | ORAL_TABLET | Freq: Every day | ORAL | Status: DC
  Administered 2017-01-06 – 2017-01-08 (×3): 500 mg via ORAL
  Filled 2017-01-06 (×3): qty 1

## 2017-01-06 MED ORDER — ASPIRIN EC 81 MG PO TBEC
81.0000 mg | DELAYED_RELEASE_TABLET | Freq: Every day | ORAL | Status: DC
Start: 2017-01-06 — End: 2017-01-08
  Administered 2017-01-06 – 2017-01-08 (×3): 81 mg via ORAL
  Filled 2017-01-06 (×3): qty 1

## 2017-01-06 MED ORDER — DEXTROSE 5 % IV SOLN
1.0000 g | INTRAVENOUS | Status: DC
  Administered 2017-01-06 – 2017-01-07 (×2): 1 g via INTRAVENOUS
  Filled 2017-01-06 (×3): qty 10

## 2017-01-06 MED ORDER — GABAPENTIN 300 MG PO CAPS
300.0000 mg | ORAL_CAPSULE | Freq: Two times a day (BID) | ORAL | Status: DC
  Administered 2017-01-06 – 2017-01-08 (×5): 300 mg via ORAL
  Filled 2017-01-06 (×5): qty 1

## 2017-01-06 MED ORDER — ACETAMINOPHEN 650 MG RE SUPP: 650.0000 mg | Freq: Four times a day (QID) | RECTAL | Status: DC | PRN

## 2017-01-06 MED ORDER — ARIPIPRAZOLE 10 MG PO TABS
30.0000 mg | ORAL_TABLET | Freq: Every day | ORAL | Status: DC
  Administered 2017-01-06 – 2017-01-07 (×2): 30 mg via ORAL
  Filled 2017-01-06 (×2): qty 3

## 2017-01-06 MED ORDER — ENOXAPARIN SODIUM 100 MG/ML ~~LOC~~ SOLN
100.0000 mg | Freq: Two times a day (BID) | SUBCUTANEOUS | Status: DC
  Administered 2017-01-06 – 2017-01-07 (×4): 100 mg via SUBCUTANEOUS
  Filled 2017-01-06 (×5): qty 1

## 2017-01-06 MED ORDER — ZIPRASIDONE HCL 60 MG PO CAPS
60.0000 mg | ORAL_CAPSULE | Freq: Every day | ORAL | Status: DC
  Administered 2017-01-06 – 2017-01-07 (×2): 60 mg via ORAL
  Filled 2017-01-06 (×2): qty 1

## 2017-01-06 MED ORDER — CLOTRIMAZOLE 1 % EX CREA
TOPICAL_CREAM | Freq: Two times a day (BID) | CUTANEOUS | Status: DC
  Administered 2017-01-06: 23:00:00 via TOPICAL
  Administered 2017-01-06: 1 via TOPICAL
  Administered 2017-01-07 – 2017-01-08 (×3): via TOPICAL
  Filled 2017-01-06: qty 15

## 2017-01-06 MED ORDER — CALCIUM CARBONATE-VITAMIN D 500-200 MG-UNIT PO TABS
1.0000 | ORAL_TABLET | Freq: Every day | ORAL | Status: DC
  Administered 2017-01-06 – 2017-01-08 (×3): 1 via ORAL
  Filled 2017-01-06 (×3): qty 1

## 2017-01-06 MED ORDER — POLYMYXIN B-TRIMETHOPRIM 10000-0.1 UNIT/ML-% OP SOLN
1.0000 [drp] | OPHTHALMIC | Status: DC
  Administered 2017-01-06 – 2017-01-08 (×13): 1 [drp] via OPHTHALMIC
  Filled 2017-01-06: qty 10

## 2017-01-06 MED ORDER — ACETAMINOPHEN 325 MG PO TABS: 650.0000 mg | ORAL_TABLET | Freq: Four times a day (QID) | ORAL | Status: DC | PRN

## 2017-01-06 MED ORDER — POLYETHYLENE GLYCOL 3350 17 G PO PACK
17.0000 g | PACK | Freq: Two times a day (BID) | ORAL | Status: DC
  Administered 2017-01-06 – 2017-01-08 (×4): 17 g via ORAL
  Filled 2017-01-06 (×5): qty 1

## 2017-01-06 MED ORDER — PANTOPRAZOLE SODIUM 40 MG PO TBEC
40.0000 mg | DELAYED_RELEASE_TABLET | Freq: Every day | ORAL | Status: DC
  Administered 2017-01-06 – 2017-01-08 (×3): 40 mg via ORAL
  Filled 2017-01-06 (×3): qty 1

## 2017-01-06 MED ORDER — PIPERACILLIN-TAZOBACTAM 3.375 G IVPB
3.3750 g | Freq: Three times a day (TID) | INTRAVENOUS | Status: DC
  Administered 2017-01-06: 3.375 g via INTRAVENOUS
  Filled 2017-01-06 (×2): qty 50

## 2017-01-06 MED ORDER — METOPROLOL SUCCINATE ER 25 MG PO TB24
12.5000 mg | ORAL_TABLET | Freq: Every day | ORAL | Status: DC
  Administered 2017-01-06 – 2017-01-08 (×3): 12.5 mg via ORAL
  Filled 2017-01-06 (×3): qty 1

## 2017-01-06 MED ORDER — ACETAMINOPHEN-CODEINE #3 300-30 MG PO TABS
1.0000 | ORAL_TABLET | Freq: Four times a day (QID) | ORAL | Status: DC | PRN
  Administered 2017-01-07: 1 via ORAL
  Filled 2017-01-06: qty 1

## 2017-01-06 MED ORDER — SENNA 8.6 MG PO TABS
1.0000 | ORAL_TABLET | Freq: Every day | ORAL | Status: DC
  Administered 2017-01-06 – 2017-01-07 (×2): 8.6 mg via ORAL
  Filled 2017-01-06 (×2): qty 1

## 2017-01-06 MED ORDER — ONDANSETRON HCL 4 MG PO TABS: 4.0000 mg | ORAL_TABLET | Freq: Four times a day (QID) | ORAL | Status: DC | PRN

## 2017-01-06 MED ORDER — DIVALPROEX SODIUM ER 500 MG PO TB24
2000.0000 mg | ORAL_TABLET | Freq: Every day | ORAL | Status: DC
  Administered 2017-01-06 – 2017-01-07 (×2): 2000 mg via ORAL
  Filled 2017-01-06 (×2): qty 4

## 2017-01-06 MED ORDER — ZIPRASIDONE HCL 60 MG PO CAPS
60.0000 mg | ORAL_CAPSULE | Freq: Every day | ORAL | Status: DC
  Filled 2017-01-06: qty 1

## 2017-01-06 MED ORDER — OXYBUTYNIN CHLORIDE 5 MG PO TABS
5.0000 mg | ORAL_TABLET | Freq: Two times a day (BID) | ORAL | Status: DC
  Administered 2017-01-06 – 2017-01-08 (×5): 5 mg via ORAL
  Filled 2017-01-06 (×5): qty 1

## 2017-01-06 NOTE — Progress Notes (Signed)
Pt. Arrived on the unit @ 0230 via [facility] staff in no acute distress, accompanied by his wife. He is alert and oriented able to answer questions appropriately. Placed on 3L O2 via [place] vitals obtained. Pt's. left hand and arm have 3+ pitting edema and are warm to touch. Elevated arm and hand. Paged Md. On call of arrival on unit no orders @ this time.

## 2017-01-06 NOTE — Progress Notes (Signed)
Pt back from MRI, unable to complete MRI per tech due to patient unable to complete ROM needed to perform test. MD notified.

## 2017-01-06 NOTE — Care Management Note (Signed)
Case Management Note  Patient Details  Name: Shaun Yoder MRN: [id number] Date of Birth: 1946-06-10  Subjective/Objective:                  71 y.o. male with history of rheumatoid arthritis wheelchair-bound, severe aortic stenosis, history of DVT on xarelto and history of bipolar disorder was brought to the ER the patient was having increasing swelling and pain over the left forearm and wrist over the last 3 days. Patient states he noticed small skin tear on the left forearm on Wednesday 5 days ago. Patient applied a Band-Aid over that. 2 days later patient started noticing swelling and pain which soon spread to his wrist. Denies any fever or chills. Denies any trauma. Does not recall any insect bites.    Action/Plan: Date:  January 06, 2017  Chart reviewed for concurrent status and case management needs.  Will continue to follow patient progress.  Discharge Planning: following for needs  Expected discharge date: [id number]  Velva Harman, BSN, [place], [place]   Expected Discharge Date:                  Expected Discharge Plan:  Home/Self Care  In-House Referral:     Discharge planning Services  CM Consult  Post Acute Care Choice:    Choice offered to:     DME Arranged:    DME Agency:     HH Arranged:    HH Agency:     Status of Service:  In process, will continue to follow  If discussed at Long Length of Stay Meetings, dates discussed:    Additional Comments:  Leeroy Cha, RN 01/06/2017, 9:36 AM

## 2017-01-06 NOTE — H&P (Signed)
History and Physical    Shaun Yoder JJK:[id number] DOB: 12/16/1945 DOA: 01/05/2017  PCP: Colon Branch, MD  Patient coming from: Home.  Chief Complaint: Left forearm and wrist swelling.  HPI: Shaun Yoder is a 71 y.o. male with history of rheumatoid arthritis wheelchair-bound, severe aortic stenosis, history of DVT on xarelto and history of bipolar disorder was brought to the ER the patient was having increasing swelling and pain over the left forearm and wrist over the last 3 days. Patient states he noticed small skin tear on the left forearm on Wednesday 5 days ago. Patient applied a Band-Aid over that. 2 days later patient started noticing swelling and pain which soon spread to his wrist. Denies any fever or chills. Denies any trauma. Does not recall any insect bites.   ED Course: In the ER on exam patient has significant swelling of the left forearm and wrist and unable to make a fist. X-rays do not reveal any bone involvement. Patient will be admitted for IV antibiotics. Had received 1 dose of IV clindamycin in the ER.  Review of Systems: As per HPI, rest all negative.   Past Medical History:  Diagnosis Date  . Anemia   . Cancer ([place])    bladder  . Colitis   . Colon polyps   . Depression   . Diverticulosis   . DVT of deep femoral vein ([place])   . Gastritis   . GERD (gastroesophageal reflux disease)   . History of rectal polyps   . Hyperlipidemia   . Hypertension   . Iron deficiency   . Low back pain   . OA (osteoarthritis)   . 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 Schizoaffective disorder, bipolar type ([place])   . Sleep apnea   . Varicose vein     Past Surgical History:  Procedure Laterality Date  . BLADDER SURGERY    . TOTAL KNEE ARTHROPLASTY Bilateral   . VARICOSE VEIN SURGERY       reports that he quit smoking about 18 years ago. His smoking use included Cigarettes. He has a 107.50 pack-year smoking history. He has never used smokeless tobacco. He  reports that he drinks alcohol. He reports that he does not use drugs.  Allergies  Allergen Reactions  . Leflunomide Other (See Comments)    diarrhea  . Morphine And Related Nausea And Vomiting  . Plaquenil [Hydroxychloroquine Sulfate]     rash    Family History  Problem Relation Age of Onset  . Heart attack Mother   . Heart attack Father   . Stroke Father   . Rheumatologic disease Neg Hx   . Colon cancer Neg Hx   . Lung disease Neg Hx   . Prostate cancer Neg Hx     Prior to Admission medications   Medication Sig Start Date End Date Taking? Authorizing Provider  Abatacept 125 MG/ML SOSY Inject into the skin once a week. Reported on 10/24/2015   Yes [provider]  acetaminophen-codeine (TYLENOL #3) 300-30 MG tablet Take 1 tablet by mouth every 4 (four) hours as needed for moderate pain.   Yes [provider]  ARIPiprazole (ABILIFY) 30 MG tablet Take 30 mg by mouth at bedtime.     Yes [provider]  aspirin EC 81 MG tablet Take 81 mg by mouth daily.   Yes [provider]  calcium-vitamin D (CALCIUM 500/D) 500-200 MG-UNIT tablet Take by mouth.   Yes [provider]  divalproex (  DEPAKOTE ER) 500 MG 24 hr tablet 4 tabs at bedtime   Yes [provider]  folic acid (FOLVITE) 1 MG tablet Take 1 mg by mouth daily.     Yes [provider]  gabapentin (NEURONTIN) 300 MG capsule Take 300 mg by mouth 2 (two) times daily.    Yes [provider]  hydrocortisone cream 1 % Apply on affected areas on face and behind ears twice daily for 14 days. 01/01/17  Yes [name], DO  hydrocortisone valerate cream (WESTCORT) 0.2 % Apply 1 application topically 2 (two) times daily.   Yes [provider]  ketoconazole (NIZORAL) 2 % shampoo Lather and massage into scalp 2-3 times per week. Leave for 5 minutes before rinsing. 01/01/17  Yes [name], DO  methotrexate (RHEUMATREX) 2.5 MG tablet Take 17.5 mg  by mouth once a week. Reported on 12/06/2015   Yes [name], MD  metoprolol succinate (TOPROL-XL) 25 MG 24 hr tablet Take 12.5 mg by mouth daily.   Yes [provider]  mirabegron ER (MYRBETRIQ) 50 MG TB24 tablet Take 50 mg by mouth daily.   Yes [provider]  Omega-3 1000 MG CAPS Take by mouth.   Yes [provider]  omeprazole (PRILOSEC) 20 MG capsule Take 20 mg by mouth every morning.    Yes [provider]  oxybutynin (DITROPAN) 5 MG tablet Take 5 mg by mouth 2 (two) times daily.   Yes [provider]  trimethoprim-polymyxin b (POLYTRIM) ophthalmic solution Place 1 drop into both eyes every 4 (four) hours. 01/01/17  Yes Wendling, Crosby Oyster, DO  vitamin C (ASCORBIC ACID) 500 MG tablet Take 500 mg by mouth daily.   Yes [provider]  XARELTO 20 MG TABS tablet TAKE 1 TABLET (20 MG TOTAL) BY MOUTH DAILY WITH SUPPER. 12/04/16  Yes Paz, Alda Berthold, MD  ziprasidone (GEODON) 60 MG capsule Take 60 mg by mouth daily.    Yes [provider]  clotrimazole (LOTRIMIN) 1 % cream Apply 1 application topically 2 (two) times daily. 01/01/17 01/15/17  [name], DO    Physical Exam: Vitals:   01/05/17 2226 01/05/17 2230 01/05/17 2331 01/06/17 0240  BP: (!) 122/109 138/78  (!) 147/103  Pulse: 69 67  65  Resp: 14   18  Temp:   98.2 F (36.8 C) 98.1 F (36.7 C)  TempSrc:    Oral  SpO2: 98% 97%  99%      Constitutional: Moderately built and nourished. Vitals:   01/05/17 2226 01/05/17 2230 01/05/17 2331 01/06/17 0240  BP: (!) 122/109 138/78  (!) 147/103  Pulse: 69 67  65  Resp: 14   18  Temp:   98.2 F (36.8 C) 98.1 F (36.7 C)  TempSrc:    Oral  SpO2: 98% 97%  99%   Eyes: Anicteric no pallor. ENMT: No discharge from the ears eyes nose and mouth. Neck: No JVD appreciated no mass felt. Respiratory: No rhonchi or crepitations. Cardiovascular: S1 and S2 heard systolic murmur heard. Abdomen: Soft nontender bowel  sounds present. Musculoskeletal: Bilateral lower extremity edema. Left upper extremity forearm swelling extending up to the wrist. Unable to make a fist. Skin: Erythema of the left forearm and wrist. Neurologic: Alert awake oriented to time place and person. Moves all extremities. Psychiatric: Appears normal. Normal affect.   Labs on Admission: I have personally reviewed following labs and imaging studies  CBC:  Recent Labs Lab 01/05/17 2318  WBC 6.0  NEUTROABS 3.3  HGB 13.5  HCT 39.5  MCV 92.9  PLT 280*   Basic Metabolic Panel:  Recent Labs Lab 01/05/17 2318  NA 138  K 3.9  CL 100*  CO2 31  GLUCOSE 92  BUN 22*  CREATININE 0.71  CALCIUM 8.7*   GFR: Estimated Creatinine Clearance: 85.3 mL/min (by C-G formula based on SCr of 0.71 mg/dL). Liver Function Tests:  Recent Labs Lab 01/05/17 2318  AST 26  ALT 12*  ALKPHOS 74  BILITOT 1.0  PROT 7.1  ALBUMIN 2.8*   No results for input(s): LIPASE, AMYLASE in the last 168 hours. No results for input(s): AMMONIA in the last 168 hours. Coagulation Profile: No results for input(s): INR, PROTIME in the last 168 hours. Cardiac Enzymes: No results for input(s): CKTOTAL, CKMB, CKMBINDEX, TROPONINI in the last 168 hours. BNP (last 3 results) No results for input(s): PROBNP in the last 8760 hours. HbA1C: No results for input(s): HGBA1C in the last 72 hours. CBG: No results for input(s): GLUCAP in the last 168 hours. Lipid Profile: No results for input(s): CHOL, HDL, LDLCALC, TRIG, CHOLHDL, LDLDIRECT in the last 72 hours. Thyroid Function Tests: No results for input(s): TSH, T4TOTAL, FREET4, T3FREE, THYROIDAB in the last 72 hours. Anemia Panel: No results for input(s): VITAMINB12, FOLATE, FERRITIN, TIBC, IRON, RETICCTPCT in the last 72 hours. Urine analysis:    Component Value Date/Time   COLORURINE ORANGE (A) 01/06/2017 0019   APPEARANCEUR CLEAR 01/06/2017 0019   LABSPEC 1.029 01/06/2017 0019   PHURINE 5.5  01/06/2017 0019   GLUCOSEU NEGATIVE 01/06/2017 0019   GLUCOSEU NEGATIVE 04/03/2016 1453   HGBUR NEGATIVE 01/06/2017 0019   BILIRUBINUR MODERATE (A) 01/06/2017 0019   BILIRUBINUR neg 04/03/2016 1549   KETONESUR 15 (A) 01/06/2017 0019   PROTEINUR NEGATIVE 01/06/2017 0019   UROBILINOGEN 1.0 04/03/2016 1549   UROBILINOGEN 2.0 (A) 04/03/2016 1453   NITRITE NEGATIVE 01/06/2017 0019   LEUKOCYTESUR SMALL (A) 01/06/2017 0019   Sepsis Labs: @LABRCNTIP (procalcitonin:4,lacticidven:4) )No results found for this or any previous visit (from the past 240 hour(s)).   Radiological Exams on Admission: Dg Wrist Complete Left  Result Date: 01/05/2017 CLINICAL DATA:  Acute onset of left hand and wrist pain, swelling and redness for 2 days. History of rheumatoid arthritis. EXAM: LEFT WRIST - COMPLETE 3+ VIEW COMPARISON:  Bilateral hand radiographs 07/05/2010 FINDINGS: Progressive erosions involving the distal radius primarily involving the radial styloid. Erosions involving the distal ulna appear similar to prior exam. Interval remottling of the distal radioulnar joint with loss of normal scapholunate interval and settling of the carpals. Multifocal cystic change versus erosions involving the carpal bones, likely progressed from prior, direct comparison difficult due to differences in technique. No evidence of acute fracture. There is diffuse soft tissue edema about the wrist. Vascular calcifications are seen. IMPRESSION: 1. Multifocal erosions involving the distal radius and ulna, progressed from 2011 (most recent comparison exam) with remottling of the distal radioulnar joint. Findings suggest sequela of rheumatoid arthritis, difficult to exclude acute infection given the soft tissue edema. 2. Chronic loss of normal scapholunate interval in settling of the carpals. 3. Multifocal cystic change versus erosions throughout the carpal bones, with mild progression from prior exam. Electronically Signed   By: Jeb Levering M.D.   On: 01/05/2017 22:36   Dg Hand 2 View Left  Result Date: 01/05/2017 CLINICAL DATA:  Acute onset of left hand and wrist pain, swelling and redness for 2 days. History rheumatoid arthritis. EXAM: LEFT HAND - 2  VIEW COMPARISON:  Bilateral hand radiographs 07/05/2010 FINDINGS: Multifocal erosions throughout the left hand and wrist. Progressive erosions and joint space narrowing of the second and third metacarpal phalangeal joints compared with radiographs from 2011. Erosive change involving carpal bones and wrist are better assessed on concurrent wrist radiographs. There is mild osteoarthritis of the thumb interphalangeal joint. Second digit held in flexion at the distal interphalangeal joint limiting assessment. Diffuse soft tissue edema about the hand and wrist. No evidence of soft tissue air. Vascular calcifications are seen. IMPRESSION: Progressive inflammatory arthritis involving the second and third metacarpal phalangeal joints from 2011 exam (most recent comparison). Findings likely represent progression of rheumatoid arthritis, however superimposed infection difficult to exclude given the diffuse soft tissue edema. Wrist findings better assessed on concurrent wrist series. Electronically Signed   By: Jeb Levering M.D.   On: 01/05/2017 22:32   US Venous Img Upper Uni Left  Result Date: 01/05/2017 CLINICAL DATA:  Left forearm swelling. EXAM: LEFT UPPER EXTREMITY VENOUS DOPPLER ULTRASOUND TECHNIQUE: Gray-scale sonography with graded compression, as well as color Doppler and duplex ultrasound were performed to evaluate the upper extremity deep venous system from the level of the subclavian vein and including the jugular, axillary, basilic, radial, ulnar and upper cephalic vein. Spectral Doppler was utilized to evaluate flow at rest and with distal augmentation maneuvers. COMPARISON:  None. FINDINGS: Contralateral Subclavian Vein: Respiratory phasicity is normal and symmetric with the  symptomatic side. No evidence of thrombus. Normal compressibility. Internal Jugular Vein: No evidence of thrombus. Normal compressibility, respiratory phasicity and response to augmentation. Subclavian Vein: No evidence of thrombus. Normal compressibility, respiratory phasicity and response to augmentation. Axillary Vein: No evidence of thrombus. Normal compressibility, respiratory phasicity and response to augmentation. Cephalic Vein: Normal compressibility. The response to augmentation could not be determined due to tenderness. Doppler flow could not be documented. Basilic Vein: No evidence of thrombus. Normal compressibility, respiratory phasicity and response to augmentation. Brachial Veins: No evidence of thrombus. Normal compressibility, respiratory phasicity and response to augmentation. Radial Veins: No evidence of thrombus. Normal compressibility, respiratory phasicity and response to augmentation. Ulnar Veins: No evidence of thrombus. Normal compressibility, respiratory phasicity and response to augmentation. Venous Reflux:  None visualized. Other Findings:  None visualized. IMPRESSION: Doppler flow could not be documented within the cephalic vein, although it was normally compressible. Response to augmentation could not be assessed due to pain. Otherwise, no evidence of DVT in the left upper extremity. Electronically Signed   By: Fidela Salisbury M.D.   On: 01/05/2017 22:24     Assessment/Plan Principal Problem:   Cellulitis of left upper extremity Active Problems:   Schizoaffective disorder, bipolar type ([facility])   Rheumatoid arthritis ([facility])   Left leg DVT ([facility])   Cellulitis    1. Cellulitis of the left upper extremity - I have ordered MRI of the left forearm and wrist to check for any deep bone involvement or abscess. I have placed patient on vancomycin and Zosyn for now. Follow cultures. Further recommendation based on MRI results and clinical course. 2. History of DVT of the lower  extremity - in anticipation of procedure would hold off xarelto and place patient on Lovenox. Patient agreeable. 3. Severe aortic stenosis - patient has declined any intervention for this. 4. History of rheumatoid arthritis - hold immunosuppressants in the setting of infection. 5. Depression/bipolar disorder on Geodon and Abilify and Depakote.   DVT prophylaxis: Lovenox. Code Status: Full code.  Family Communication: Patient's wife.  Disposition Plan: Home.  Consults called: None.  Admission status: Observation.    Rise Patience MD [facility] Pager [phone].  If 7PM-7AM, please contact night-coverage [email or web address] Password [email or web address]  01/06/2017, 4:02 AM

## 2017-01-06 NOTE — Progress Notes (Signed)
Pharmacy Antibiotic and Anticoagulation  Shaun Yoder is a 71 y.o. male with worsening left hand pain, redness and swelling admitted on 01/05/2017 with cellulitis.  Pharmacy has been consulted for zosyn, vancomycin and Lovenox dosing  Plan: Zosyn 3.375g IV q8h (4 hour infusion). Vancomycin 1 Gm IV q12h VT=10-15 mg/L Baseline HL/aPtt and PT/INR now Lovenox 100 mg sq q12h    Temp (24hrs), Avg:98.2 F (36.8 C), Min:98.1 F (36.7 C), Max:98.2 F (36.8 C)   Recent Labs Lab 01/05/17 2318 01/05/17 2332  WBC 6.0  --   CREATININE 0.71  --   LATICACIDVEN  --  1.10    Estimated Creatinine Clearance: 85.3 mL/min (by C-G formula based on SCr of 0.71 mg/dL).    Allergies  Allergen Reactions  . Leflunomide Other (See Comments)    diarrhea  . Morphine And Related Nausea And Vomiting  . Plaquenil [Hydroxychloroquine Sulfate]     rash    Antimicrobials this admission: 6/17 clindamycin >> x1 ED 6/18 zosyn >>  6/18 vancomycin >>   Dose adjustments this admission:   Microbiology results:  BCx:   UCx:    Sputum:    MRSA PCR:   Thank you for allowing pharmacy to be a part of this patient's care.  Dorrene German 01/06/2017 4:55 AM

## 2017-01-06 NOTE — Progress Notes (Signed)
PROGRESS NOTE  MCCOY TESTA GMW:[id number] DOB: 11/16/1945 DOA: 01/05/2017 PCP: Colon Branch, MD  HPI/Recap of past 24 hours:  Left forearm edema, tender, no fever Wife at bedside  Assessment/Plan: Principal Problem:   Cellulitis of left upper extremity Active Problems:   Schizoaffective disorder, bipolar type (HCC)   Rheumatoid arthritis ([place])   Left leg DVT ([place])   Cellulitis   Cellulitis of the left upper extremity in an immunosuppressed individual -  MRI of the left forearm and wrist to check for any deep bone involvement or abscess. He is started on vancomycin and Zosyn from admission, zosyn changed to rocephin due to low suspicion of pesuedomona. Follow cultures. Further recommendation based on MRI results and clinical course.  History of DVT of the lower extremity - in anticipation of procedure would hold off xarelto and place patient on Lovenox. Patient agreeable.  Severe aortic stenosis - patient has declined any intervention for this.  History of rheumatoid arthritis - hold immunosuppressants in the setting of infection.  Depression/bipolar disorder on Geodon and Abilify and Depakote.  FTT: wheelchair bound for the last two months   DVT prophylaxis: Lovenox. Code Status: Full code.  Family Communication: Patient's wife at bedside Disposition Plan: Home, may need home health Consults called: None.    Procedures:  none  Antibiotics:  vanc from admission  Zosyn from admission to 6/18  Rocephin from 6/18   Objective: BP 119/67 (BP Location: Right Arm)   Pulse 62   Temp 97.6 F (36.4 C) (Oral)   Resp 20   SpO2 99%   Intake/Output Summary (Last 24 hours) at 01/06/17 1701 Last data filed at 01/06/17 1520  Gross per 24 hour  Intake              370 ml  Output                0 ml  Net              370 ml   There were no vitals filed for this visit.  Exam:   General:  Chronically ill appearing, NAD, pleasant  Cardiovascular:  RRR  Respiratory: CTABL  Abdomen: Soft/ND/NT, positive BS  Musculoskeletal: left forearm and wrist edema, erythema, tender, chronic bilateral lower extremity edema  Neuro: aaox3  Data Reviewed: Basic Metabolic Panel:  Recent Labs Lab 01/05/17 2318 01/06/17 0537  NA 138 140  K 3.9 3.8  CL 100* 102  CO2 31 33*  GLUCOSE 92 83  BUN 22* 20  CREATININE 0.71 0.57*  CALCIUM 8.7* 8.5*   Liver Function Tests:  Recent Labs Lab 01/05/17 2318  AST 26  ALT 12*  ALKPHOS 74  BILITOT 1.0  PROT 7.1  ALBUMIN 2.8*   No results for input(s): LIPASE, AMYLASE in the last 168 hours. No results for input(s): AMMONIA in the last 168 hours. CBC:  Recent Labs Lab 01/05/17 2318 01/06/17 0537  WBC 6.0 4.9  NEUTROABS 3.3  --   HGB 13.5 12.1*  HCT 39.5 36.1*  MCV 92.9 90.9  PLT 140* 128*   Cardiac Enzymes:   No results for input(s): CKTOTAL, CKMB, CKMBINDEX, TROPONINI in the last 168 hours. BNP (last 3 results) No results for input(s): BNP in the last 8760 hours.  ProBNP (last 3 results) No results for input(s): PROBNP in the last 8760 hours.  CBG: No results for input(s): GLUCAP in the last 168 hours.  Recent Results (from the past 240 hour(s))  MRSA PCR Screening  Status: None   Collection Time: 01/06/17 11:15 AM  Result Value Ref Range Status   MRSA by PCR NEGATIVE NEGATIVE Final    Comment:        The GeneXpert MRSA Assay (FDA approved for NASAL specimens only), is one component of a comprehensive MRSA colonization surveillance program. It is not intended to diagnose MRSA infection nor to guide or monitor treatment for MRSA infections.      Studies: Dg Wrist Complete Left  Result Date: 01/05/2017 CLINICAL DATA:  Acute onset of left hand and wrist pain, swelling and redness for 2 days. History of rheumatoid arthritis. EXAM: LEFT WRIST - COMPLETE 3+ VIEW COMPARISON:  Bilateral hand radiographs 07/05/2010 FINDINGS: Progressive erosions involving the distal  radius primarily involving the radial styloid. Erosions involving the distal ulna appear similar to prior exam. Interval remottling of the distal radioulnar joint with loss of normal scapholunate interval and settling of the carpals. Multifocal cystic change versus erosions involving the carpal bones, likely progressed from prior, direct comparison difficult due to differences in technique. No evidence of acute fracture. There is diffuse soft tissue edema about the wrist. Vascular calcifications are seen. IMPRESSION: 1. Multifocal erosions involving the distal radius and ulna, progressed from 2011 (most recent comparison exam) with remottling of the distal radioulnar joint. Findings suggest sequela of rheumatoid arthritis, difficult to exclude acute infection given the soft tissue edema. 2. Chronic loss of normal scapholunate interval in settling of the carpals. 3. Multifocal cystic change versus erosions throughout the carpal bones, with mild progression from prior exam. Electronically Signed   By: Jeb Levering M.D.   On: 01/05/2017 22:36   Dg Hand 2 View Left  Result Date: 01/05/2017 CLINICAL DATA:  Acute onset of left hand and wrist pain, swelling and redness for 2 days. History rheumatoid arthritis. EXAM: LEFT HAND - 2 VIEW COMPARISON:  Bilateral hand radiographs 07/05/2010 FINDINGS: Multifocal erosions throughout the left hand and wrist. Progressive erosions and joint space narrowing of the second and third metacarpal phalangeal joints compared with radiographs from 2011. Erosive change involving carpal bones and wrist are better assessed on concurrent wrist radiographs. There is mild osteoarthritis of the thumb interphalangeal joint. Second digit held in flexion at the distal interphalangeal joint limiting assessment. Diffuse soft tissue edema about the hand and wrist. No evidence of soft tissue air. Vascular calcifications are seen. IMPRESSION: Progressive inflammatory arthritis involving the second  and third metacarpal phalangeal joints from 2011 exam (most recent comparison). Findings likely represent progression of rheumatoid arthritis, however superimposed infection difficult to exclude given the diffuse soft tissue edema. Wrist findings better assessed on concurrent wrist series. Electronically Signed   By: Jeb Levering M.D.   On: 01/05/2017 22:32   US Venous Img Upper Uni Left  Result Date: 01/05/2017 CLINICAL DATA:  Left forearm swelling. EXAM: LEFT UPPER EXTREMITY VENOUS DOPPLER ULTRASOUND TECHNIQUE: Gray-scale sonography with graded compression, as well as color Doppler and duplex ultrasound were performed to evaluate the upper extremity deep venous system from the level of the subclavian vein and including the jugular, axillary, basilic, radial, ulnar and upper cephalic vein. Spectral Doppler was utilized to evaluate flow at rest and with distal augmentation maneuvers. COMPARISON:  None. FINDINGS: Contralateral Subclavian Vein: Respiratory phasicity is normal and symmetric with the symptomatic side. No evidence of thrombus. Normal compressibility. Internal Jugular Vein: No evidence of thrombus. Normal compressibility, respiratory phasicity and response to augmentation. Subclavian Vein: No evidence of thrombus. Normal compressibility, respiratory phasicity and response to augmentation.  Axillary Vein: No evidence of thrombus. Normal compressibility, respiratory phasicity and response to augmentation. Cephalic Vein: Normal compressibility. The response to augmentation could not be determined due to tenderness. Doppler flow could not be documented. Basilic Vein: No evidence of thrombus. Normal compressibility, respiratory phasicity and response to augmentation. Brachial Veins: No evidence of thrombus. Normal compressibility, respiratory phasicity and response to augmentation. Radial Veins: No evidence of thrombus. Normal compressibility, respiratory phasicity and response to augmentation. Ulnar  Veins: No evidence of thrombus. Normal compressibility, respiratory phasicity and response to augmentation. Venous Reflux:  None visualized. Other Findings:  None visualized. IMPRESSION: Doppler flow could not be documented within the cephalic vein, although it was normally compressible. Response to augmentation could not be assessed due to pain. Otherwise, no evidence of DVT in the left upper extremity. Electronically Signed   By: Fidela Salisbury M.D.   On: 01/05/2017 22:24   Dg Chest Port 1 View  Result Date: 01/06/2017 CLINICAL DATA:  Left forearm swelling EXAM: PORTABLE CHEST 1 VIEW COMPARISON:  01/22/2016 FINDINGS: Small area of linear scarring at the left lung base. There is no focal parenchymal opacity. There is no pleural effusion or pneumothorax. The heart and mediastinal contours are unremarkable. Severe chronic osteoarthritis and fragmentation of bilateral shoulders. IMPRESSION: No active disease. Electronically Signed   By: Kathreen Devoid   On: 01/06/2017 13:35    Scheduled Meds: . ARIPiprazole  30 mg Oral QHS  . aspirin EC  81 mg Oral Daily  . calcium-vitamin D  1 tablet Oral Q breakfast  . clotrimazole   Topical BID  . divalproex  2,000 mg Oral QHS  . enoxaparin (LOVENOX) injection  100 mg Subcutaneous Q12H  . folic acid  1 mg Oral Daily  . gabapentin  300 mg Oral BID  . metoprolol succinate  12.5 mg Oral Daily  . mirabegron ER  50 mg Oral Daily  . oxybutynin  5 mg Oral BID  . pantoprazole  40 mg Oral Daily  . polyethylene glycol  17 g Oral BID  . senna  1 tablet Oral QHS  . trimethoprim-polymyxin b  1 drop Both Eyes Q4H  . vitamin C  500 mg Oral Daily  . ziprasidone  60 mg Oral QHS    Continuous Infusions: . cefTRIAXone (ROCEPHIN)  IV Stopped (01/06/17 1617)  . vancomycin Stopped (01/06/17 2637)     Time spent: 52mins from 8:45 to 9:20 am  Shanise Balch MD, PhD  [facility] Pager [phone]. If 7PM-7AM, please contact night-coverage at [email or web address], password  [facility] 01/06/2017, 5:01 PM  LOS: 0 days

## 2017-01-07 LAB — BASIC METABOLIC PANEL
ANION GAP: 7 (ref 5–15)
BUN: 17 mg/dL (ref 6–20)
CHLORIDE: 100 mmol/L — AB (ref 101–111)
CO2: 32 mmol/L (ref 22–32)
Calcium: 8.6 mg/dL — ABNORMAL LOW (ref 8.9–10.3)
Creatinine, Ser: 0.62 mg/dL (ref 0.61–1.24)
GFR calc non Af Amer: >60 mL/min (ref >60)
Glucose, Bld: 112 mg/dL — ABNORMAL HIGH (ref 65–99)
POTASSIUM: 3.8 mmol/L (ref 3.5–5.1)
Sodium: 139 mmol/L (ref 135–145)

## 2017-01-07 LAB — CBC WITH DIFFERENTIAL/PLATELET
BASOS ABS: 0 10*3/uL (ref 0.0–0.1)
BASOS PCT: 0 %
Eosinophils Absolute: 0.1 10*3/uL (ref 0.0–0.7)
Eosinophils Relative: 2 %
HEMATOCRIT: 35.9 % — AB (ref 39.0–52.0)
HEMOGLOBIN: 12.1 g/dL — AB (ref 13.0–17.0)
Lymphocytes Relative: 34 %
Lymphs Abs: 1.7 10*3/uL (ref 0.7–4.0)
MCH: 30.7 pg (ref 26.0–34.0)
MCHC: 33.7 g/dL (ref 30.0–36.0)
MCV: 91.1 fL (ref 78.0–100.0)
MONOS PCT: 10 %
Monocytes Absolute: 0.5 10*3/uL (ref 0.1–1.0)
NEUTROS ABS: 2.8 10*3/uL (ref 1.7–7.7)
NEUTROS PCT: 54 %
Platelets: 136 10*3/uL — ABNORMAL LOW (ref 150–400)
RBC: 3.94 MIL/uL — AB (ref 4.22–5.81)
RDW: 14.5 % (ref 11.5–15.5)
WBC: 5.1 10*3/uL (ref 4.0–10.5)

## 2017-01-07 NOTE — Consult Note (Signed)
Shaun Yoder is an 71 y.o. male.   Chief Complaint: left wrist swelling and pain HPI: 71 yo rhd male states he began having issues with left hand/wrist/forearm 5 days ago.  Has had swelling and pain.  Admitted 3 days ago and started on IV abx.  History of RA treated with methotrexate.  Has noted improvement in the pain, swelling, and ROM.  Describes and achy pain of 4/10 severity.  Alleviated with rest and aggravated somewhat by motion.  No fevers, chills, night sweats.  Does not feel ill.  Dr. Annamaria Boots Xu's note from 01/07/2017 reviewed. Xrays viewed and interpreted by me: ap, lateral, oblique views of left wrist show no fractures or dislocations.  Significant degenerative changes to wrist with collapse of carpus, erosion of distal radius, and cystic changes noted consistent with RA. Labs reviewed: WBC 5.1  Allergies:  Allergies  Allergen Reactions  . Leflunomide Other (See Comments)    diarrhea  . Morphine And Related Nausea And Vomiting  . Plaquenil [Hydroxychloroquine Sulfate]     rash    Past Medical History:  Diagnosis Date  . Anemia   . Cancer ([place])    bladder  . Colitis   . Colon polyps   . Depression   . Diverticulosis   . DVT of deep femoral vein ([place])   . Gastritis   . GERD (gastroesophageal reflux disease)   . History of rectal polyps   . Hyperlipidemia   . Hypertension   . Iron deficiency   . Low back pain   . OA (osteoarthritis)   . Osteonecrosis of shoulder region ([place])   . Schizoaffective disorder, bipolar type ([place])   . Sleep apnea   . Varicose vein     Past Surgical History:  Procedure Laterality Date  . BLADDER SURGERY    . TOTAL KNEE ARTHROPLASTY Bilateral   . VARICOSE VEIN SURGERY      Family History: Family History  Problem Relation Age of Onset  . Heart attack Mother   . Heart attack Father   . Stroke Father   . Rheumatologic disease Neg Hx   . Colon cancer Neg Hx   . Lung disease Neg Hx   . Prostate cancer Neg Hx     Social  History:   reports that he quit smoking about 18 years ago. His smoking use included Cigarettes. He has a 107.50 pack-year smoking history. He has never used smokeless tobacco. He reports that he drinks alcohol. He reports that he does not use drugs.  Medications: Medications Prior to Admission  Medication Sig Dispense Refill  . Abatacept 125 MG/ML SOSY Inject into the skin once a week. Reported on 10/24/2015    . acetaminophen-codeine (TYLENOL #3) 300-30 MG tablet Take 1 tablet by mouth every 4 (four) hours as needed for moderate pain.    . ARIPiprazole (ABILIFY) 30 MG tablet Take 30 mg by mouth at bedtime.      [name] aspirin EC 81 MG tablet Take 81 mg by mouth daily.    . calcium-vitamin D (CALCIUM 500/D) 500-200 MG-UNIT tablet Take by mouth.    . divalproex (DEPAKOTE ER) 500 MG 24 hr tablet 4 tabs at bedtime    . folic acid (FOLVITE) 1 MG tablet Take 1 mg by mouth daily.      [name] gabapentin (NEURONTIN) 300 MG capsule Take 300 mg by mouth 2 (two) times daily.     . hydrocortisone cream 1 % Apply on affected areas on face and behind ears  twice daily for 14 days. 30 g 1  . hydrocortisone valerate cream (WESTCORT) 0.2 % Apply 1 application topically 2 (two) times daily.    [name] ketoconazole (NIZORAL) 2 % shampoo Lather and massage into scalp 2-3 times per week. Leave for 5 minutes before rinsing. 120 mL 0  . methotrexate (RHEUMATREX) 2.5 MG tablet Take 17.5 mg by mouth once a week. Reported on 12/06/2015    . metoprolol succinate (TOPROL-XL) 25 MG 24 hr tablet Take 12.5 mg by mouth daily.    . mirabegron ER (MYRBETRIQ) 50 MG TB24 tablet Take 50 mg by mouth daily.    . Omega-3 1000 MG CAPS Take by mouth.    [name] omeprazole (PRILOSEC) 20 MG capsule Take 20 mg by mouth every morning.     [name] oxybutynin (DITROPAN) 5 MG tablet Take 5 mg by mouth 2 (two) times daily.    [name] trimethoprim-polymyxin b (POLYTRIM) ophthalmic solution Place 1 drop into both eyes every 4 (four) hours. 10 mL 0  . vitamin C (ASCORBIC ACID) 500  MG tablet Take 500 mg by mouth daily.    Alveda Reasons 20 MG TABS tablet TAKE 1 TABLET (20 MG TOTAL) BY MOUTH DAILY WITH SUPPER. 90 tablet 0  . ziprasidone (GEODON) 60 MG capsule Take 60 mg by mouth daily.     . clotrimazole (LOTRIMIN) 1 % cream Apply 1 application topically 2 (two) times daily. 30 g 0    Results for orders placed or performed during the hospital encounter of 01/05/17 (from the past 48 hour(s))  CBC with Differential     Status: Abnormal   Collection Time: 01/05/17 11:18 PM  Result Value Ref Range   WBC 6.0 4.0 - 10.5 K/uL   RBC 4.25 4.22 - 5.81 MIL/uL   Hemoglobin 13.5 13.0 - 17.0 g/dL   HCT 39.5 39.0 - 52.0 %   MCV 92.9 78.0 - 100.0 fL   MCH 31.8 26.0 - 34.0 pg   MCHC 34.2 30.0 - 36.0 g/dL   RDW 14.6 11.5 - 15.5 %   Platelets 140 (L) 150 - 400 K/uL   Neutrophils Relative % 55 %   Neutro Abs 3.3 1.7 - 7.7 K/uL   Lymphocytes Relative 33 %   Lymphs Abs 2.0 0.7 - 4.0 K/uL   Monocytes Relative 11 %   Monocytes Absolute 0.6 0.1 - 1.0 K/uL   Eosinophils Relative 1 %   Eosinophils Absolute 0.0 0.0 - 0.7 K/uL   Basophils Relative 0 %   Basophils Absolute 0.0 0.0 - 0.1 K/uL  Comprehensive metabolic panel     Status: Abnormal   Collection Time: 01/05/17 11:18 PM  Result Value Ref Range   Sodium 138 135 - 145 mmol/L   Potassium 3.9 3.5 - 5.1 mmol/L   Chloride 100 (L) 101 - 111 mmol/L   CO2 31 22 - 32 mmol/L   Glucose, Bld 92 65 - 99 mg/dL   BUN 22 (H) 6 - 20 mg/dL   Creatinine, Ser 0.71 0.61 - 1.24 mg/dL   Calcium 8.7 (L) 8.9 - 10.3 mg/dL   Total Protein 7.1 6.5 - 8.1 g/dL   Albumin 2.8 (L) 3.5 - 5.0 g/dL   AST 26 15 - 41 U/L   ALT 12 (L) 17 - 63 U/L   Alkaline Phosphatase 74 38 - 126 U/L   Total Bilirubin 1.0 0.3 - 1.2 mg/dL   GFR calc non Af Amer >60 >60 mL/min   GFR calc Af Amer >60 >60 mL/min  Comment: (NOTE) The eGFR has been calculated using the CKD EPI equation. This calculation has not been validated in all clinical situations. eGFR's persistently <60  mL/min signify possible Chronic Kidney Disease.    Anion gap 7 5 - 15  Blood culture (routine x 2)     Status: None (Preliminary result)   Collection Time: 01/05/17 11:28 PM  Result Value Ref Range   Specimen Description BLOOD RIGHT ARM    Special Requests      BOTTLES DRAWN AEROBIC AND ANAEROBIC Blood Culture adequate volume   Culture      NO GROWTH 1 DAY Performed at [facility], [address], [place], [place] [address]    Report Status PENDING   Blood culture (routine x 2)     Status: None (Preliminary result)   Collection Time: 01/05/17 11:29 PM  Result Value Ref Range   Specimen Description BLOOD RIGHT HAND    Special Requests      BOTTLES DRAWN AEROBIC AND ANAEROBIC Blood Culture adequate volume   Culture      NO GROWTH 1 DAY Performed at [facility], [address] [place], [place] [address]    Report Status PENDING   I-Stat CG4 Lactic Acid, ED     Status: None   Collection Time: 01/05/17 11:32 PM  Result Value Ref Range   Lactic Acid, Venous 1.10 0.5 - 1.9 mmol/L  Urinalysis, Routine w reflex microscopic     Status: Abnormal   Collection Time: 01/06/17 12:19 AM  Result Value Ref Range   Color, Urine ORANGE (A) YELLOW    Comment: BIOCHEMICALS MAY BE AFFECTED BY COLOR   APPearance CLEAR CLEAR   Specific Gravity, Urine 1.029 1.005 - 1.030   pH 5.5 5.0 - 8.0   Glucose, UA NEGATIVE NEGATIVE mg/dL   Hgb urine dipstick NEGATIVE NEGATIVE   Bilirubin Urine MODERATE (A) NEGATIVE   Ketones, ur 15 (A) NEGATIVE mg/dL   Protein, ur NEGATIVE NEGATIVE mg/dL   Nitrite NEGATIVE NEGATIVE   Leukocytes, UA SMALL (A) NEGATIVE  Urinalysis, Microscopic (reflex)     Status: Abnormal   Collection Time: 01/06/17 12:19 AM  Result Value Ref Range   RBC / HPF 0-5 0 - 5 RBC/hpf   WBC, UA 0-5 0 - 5 WBC/hpf   Bacteria, UA FEW (A) NONE SEEN   Squamous Epithelial / LPF 0-5 (A) NONE SEEN   Mucous PRESENT   Basic metabolic panel     Status: Abnormal   Collection Time:  01/06/17  5:37 AM  Result Value Ref Range   Sodium 140 135 - 145 mmol/L   Potassium 3.8 3.5 - 5.1 mmol/L   Chloride 102 101 - 111 mmol/L   CO2 33 (H) 22 - 32 mmol/L   Glucose, Bld 83 65 - 99 mg/dL   BUN 20 6 - 20 mg/dL   Creatinine, Ser 2.99 (L) 0.61 - 1.24 mg/dL   Calcium 8.5 (L) 8.9 - 10.3 mg/dL   GFR calc non Af Amer >60 >60 mL/min   GFR calc Af Amer >60 >60 mL/min    Comment: (NOTE) The eGFR has been calculated using the CKD EPI equation. This calculation has not been validated in all clinical situations. eGFR's persistently <60 mL/min signify possible Chronic Kidney Disease.    Anion gap 5 5 - 15  CBC     Status: Abnormal   Collection Time: 01/06/17  5:37 AM  Result Value Ref Range   WBC 4.9 4.0 - 10.5  K/uL   RBC 3.97 (L) 4.22 - 5.81 MIL/uL   Hemoglobin 12.1 (L) 13.0 - 17.0 g/dL   HCT 36.1 (L) 39.0 - 52.0 %   MCV 90.9 78.0 - 100.0 fL   MCH 30.5 26.0 - 34.0 pg   MCHC 33.5 30.0 - 36.0 g/dL   RDW 14.4 11.5 - 15.5 %   Platelets 128 (L) 150 - 400 K/uL  Heparin level (unfractionated)     Status: None   Collection Time: 01/06/17  5:37 AM  Result Value Ref Range   Heparin Unfractionated 0.68 0.30 - 0.70 IU/mL    Comment:        IF HEPARIN RESULTS ARE BELOW EXPECTED VALUES, AND PATIENT DOSAGE HAS BEEN CONFIRMED, SUGGEST FOLLOW UP TESTING OF ANTITHROMBIN III LEVELS.   APTT     Status: Abnormal   Collection Time: 01/06/17  5:37 AM  Result Value Ref Range   aPTT 52 (H) 24 - 36 seconds    Comment:        IF BASELINE aPTT IS ELEVATED, SUGGEST PATIENT RISK ASSESSMENT BE USED TO DETERMINE APPROPRIATE ANTICOAGULANT THERAPY.   Protime-INR     Status: Abnormal   Collection Time: 01/06/17  5:37 AM  Result Value Ref Range   Prothrombin Time 16.2 (H) 11.4 - 15.2 seconds   INR 1.29   MRSA PCR Screening     Status: None   Collection Time: 01/06/17 11:15 AM  Result Value Ref Range   MRSA by PCR NEGATIVE NEGATIVE    Comment:        The GeneXpert MRSA Assay (FDA approved for  NASAL specimens only), is one component of a comprehensive MRSA colonization surveillance program. It is not intended to diagnose MRSA infection nor to guide or monitor treatment for MRSA infections.   CBC with Differential/Platelet     Status: Abnormal   Collection Time: 01/07/17  8:45 AM  Result Value Ref Range   WBC 5.1 4.0 - 10.5 K/uL   RBC 3.94 (L) 4.22 - 5.81 MIL/uL   Hemoglobin 12.1 (L) 13.0 - 17.0 g/dL   HCT 35.9 (L) 39.0 - 52.0 %   MCV 91.1 78.0 - 100.0 fL   MCH 30.7 26.0 - 34.0 pg   MCHC 33.7 30.0 - 36.0 g/dL   RDW 14.5 11.5 - 15.5 %   Platelets 136 (L) 150 - 400 K/uL   Neutrophils Relative % 54 %   Neutro Abs 2.8 1.7 - 7.7 K/uL   Lymphocytes Relative 34 %   Lymphs Abs 1.7 0.7 - 4.0 K/uL   Monocytes Relative 10 %   Monocytes Absolute 0.5 0.1 - 1.0 K/uL   Eosinophils Relative 2 %   Eosinophils Absolute 0.1 0.0 - 0.7 K/uL   Basophils Relative 0 %   Basophils Absolute 0.0 0.0 - 0.1 K/uL  Basic metabolic panel     Status: Abnormal   Collection Time: 01/07/17  8:45 AM  Result Value Ref Range   Sodium 139 135 - 145 mmol/L   Potassium 3.8 3.5 - 5.1 mmol/L   Chloride 100 (L) 101 - 111 mmol/L   CO2 32 22 - 32 mmol/L   Glucose, Bld 112 (H) 65 - 99 mg/dL   BUN 17 6 - 20 mg/dL   Creatinine, Ser 0.62 0.61 - 1.24 mg/dL   Calcium 8.6 (L) 8.9 - 10.3 mg/dL   GFR calc non Af Amer >60 >60 mL/min   GFR calc Af Amer >60 >60 mL/min    Comment: (NOTE) The eGFR  has been calculated using the CKD EPI equation. This calculation has not been validated in all clinical situations. eGFR's persistently <60 mL/min signify possible Chronic Kidney Disease.    Anion gap 7 5 - 15    Dg Wrist Complete Left  Result Date: 01/05/2017 CLINICAL DATA:  Acute onset of left hand and wrist pain, swelling and redness for 2 days. History of rheumatoid arthritis. EXAM: LEFT WRIST - COMPLETE 3+ VIEW COMPARISON:  Bilateral hand radiographs 07/05/2010 FINDINGS: Progressive erosions involving the distal  radius primarily involving the radial styloid. Erosions involving the distal ulna appear similar to prior exam. Interval remottling of the distal radioulnar joint with loss of normal scapholunate interval and settling of the carpals. Multifocal cystic change versus erosions involving the carpal bones, likely progressed from prior, direct comparison difficult due to differences in technique. No evidence of acute fracture. There is diffuse soft tissue edema about the wrist. Vascular calcifications are seen. IMPRESSION: 1. Multifocal erosions involving the distal radius and ulna, progressed from 2011 (most recent comparison exam) with remottling of the distal radioulnar joint. Findings suggest sequela of rheumatoid arthritis, difficult to exclude acute infection given the soft tissue edema. 2. Chronic loss of normal scapholunate interval in settling of the carpals. 3. Multifocal cystic change versus erosions throughout the carpal bones, with mild progression from prior exam. Electronically Signed   By: Jeb Levering M.D.   On: 01/05/2017 22:36   Dg Hand 2 View Left  Result Date: 01/05/2017 CLINICAL DATA:  Acute onset of left hand and wrist pain, swelling and redness for 2 days. History rheumatoid arthritis. EXAM: LEFT HAND - 2 VIEW COMPARISON:  Bilateral hand radiographs 07/05/2010 FINDINGS: Multifocal erosions throughout the left hand and wrist. Progressive erosions and joint space narrowing of the second and third metacarpal phalangeal joints compared with radiographs from 2011. Erosive change involving carpal bones and wrist are better assessed on concurrent wrist radiographs. There is mild osteoarthritis of the thumb interphalangeal joint. Second digit held in flexion at the distal interphalangeal joint limiting assessment. Diffuse soft tissue edema about the hand and wrist. No evidence of soft tissue air. Vascular calcifications are seen. IMPRESSION: Progressive inflammatory arthritis involving the second  and third metacarpal phalangeal joints from 2011 exam (most recent comparison). Findings likely represent progression of rheumatoid arthritis, however superimposed infection difficult to exclude given the diffuse soft tissue edema. Wrist findings better assessed on concurrent wrist series. Electronically Signed   By: Jeb Levering M.D.   On: 01/05/2017 22:32   US Venous Img Upper Uni Left  Result Date: 01/05/2017 CLINICAL DATA:  Left forearm swelling. EXAM: LEFT UPPER EXTREMITY VENOUS DOPPLER ULTRASOUND TECHNIQUE: Gray-scale sonography with graded compression, as well as color Doppler and duplex ultrasound were performed to evaluate the upper extremity deep venous system from the level of the subclavian vein and including the jugular, axillary, basilic, radial, ulnar and upper cephalic vein. Spectral Doppler was utilized to evaluate flow at rest and with distal augmentation maneuvers. COMPARISON:  None. FINDINGS: Contralateral Subclavian Vein: Respiratory phasicity is normal and symmetric with the symptomatic side. No evidence of thrombus. Normal compressibility. Internal Jugular Vein: No evidence of thrombus. Normal compressibility, respiratory phasicity and response to augmentation. Subclavian Vein: No evidence of thrombus. Normal compressibility, respiratory phasicity and response to augmentation. Axillary Vein: No evidence of thrombus. Normal compressibility, respiratory phasicity and response to augmentation. Cephalic Vein: Normal compressibility. The response to augmentation could not be determined due to tenderness. Doppler flow could not be documented. Basilic Vein: No  evidence of thrombus. Normal compressibility, respiratory phasicity and response to augmentation. Brachial Veins: No evidence of thrombus. Normal compressibility, respiratory phasicity and response to augmentation. Radial Veins: No evidence of thrombus. Normal compressibility, respiratory phasicity and response to augmentation. Ulnar  Veins: No evidence of thrombus. Normal compressibility, respiratory phasicity and response to augmentation. Venous Reflux:  None visualized. Other Findings:  None visualized. IMPRESSION: Doppler flow could not be documented within the cephalic vein, although it was normally compressible. Response to augmentation could not be assessed due to pain. Otherwise, no evidence of DVT in the left upper extremity. Electronically Signed   By: Fidela Salisbury M.D.   On: 01/05/2017 22:24   Dg Chest Port 1 View  Result Date: 01/06/2017 CLINICAL DATA:  Left forearm swelling EXAM: PORTABLE CHEST 1 VIEW COMPARISON:  01/22/2016 FINDINGS: Small area of linear scarring at the left lung base. There is no focal parenchymal opacity. There is no pleural effusion or pneumothorax. The heart and mediastinal contours are unremarkable. Severe chronic osteoarthritis and fragmentation of bilateral shoulders. IMPRESSION: No active disease. Electronically Signed   By: Kathreen Devoid   On: 01/06/2017 13:35     A comprehensive review of systems was negative except for: Hematologic/lymphatic: positive for bleeding and easy bruising Review of Systems: No fevers, chills, night sweats, chest pain, shortness of breath, nausea, vomiting, diarrhea, constipation, headaches, dizziness, vision changes, fainting.   Blood pressure 121/63, pulse (!) 53, temperature 98.4 F (36.9 C), temperature source Oral, resp. rate 20, SpO2 98 %.  General appearance: alert, cooperative and appears stated age Head: Normocephalic, without obvious abnormality, atraumatic Neck: supple, symmetrical, trachea midline Extremities: Intact sensation and capillary refill all digits.  +epl/fpl/io.  Scabbed wound on dorsum left forearm.  Swelling of hand and wrist.  Able to move digits and wrist without pain.  Limited flexion/extension and supination of wrist.  Compartments soft.  Some bruising in forearm.  No proximal streaking.  No fluctuance.  Pulses: 2+ and  symmetric Skin: Skin color, texture, turgor normal. No rashes or lesions Neurologic: Grossly normal Incision/Wound: As above  Assessment/Plan Left wrist resolving swelling and pain.  I do not see need for incision and drainage at this point.  Unable to position for MRI.  Consider ultrasound if still concern of fluid/abscess collection.  Favor flare of arthritis in wrist as part of process.  WBC normal and consistent with past year of values.  Afebrile and feels well.  Will follow.  Patient may follow up in office after discharge at his discretion.  Tasmin Exantus R 01/07/2017, 4:38 PM

## 2017-01-07 NOTE — Progress Notes (Addendum)
PROGRESS NOTE  NIKODEM LEADBETTER RFX:[id number] DOB: Mar 30, 1946 DOA: 01/05/2017 PCP: Colon Branch, MD  Brief Summary:  Advanced rheumatoid arthritis, immunosuppressed status, Admitted for left forearm cellulitis. Not able to get mri or ct fore arm due to RA limited range of motion Hand surgery consulted, no indication for surgery, AR flare +/-infection Improving on abx, likely able to d/c on 6/20 with abx to finish course and outpatient rheumatology /hand surgery follow up.     HPI/Recap of past 24 hours:  Feeling better, no fever, Left forearm less edema, less tender, with increased range of motion Wife at bedside  Assessment/Plan: Principal Problem:   Cellulitis of left upper extremity Active Problems:   Schizoaffective disorder, bipolar type (HCC)   Rheumatoid arthritis ([place])   Left leg DVT ([place])   Cellulitis   Cellulitis of the left upper extremity (left forearm, wrist, hand) in an immunosuppressed individual -   --X ray of left hand " Progressive inflammatory arthritis involving the second and third metacarpal phalangeal joints from 2011 exam (most recent comparison). Findings likely represent progression of rheumatoid arthritis, however superimposed infection difficult to exclude given the diffuse soft tissue edema." --X ray of left wrist: " 1. Multifocal erosions involving the distal radius and ulna, progressed from 2011 (most recent comparison exam) with remottling of the distal radioulnar joint. Findings suggest sequela of rheumatoid arthritis, difficult to exclude acute infection given the soft tissue edema. 2. Chronic loss of normal scapholunate interval in settling of the carpals. 3. Multifocal cystic change versus erosions throughout the carpal bones, with mild progression from prior exam" --venous US left arm negative for DVT --he could not tolerate mri or Ct for left forearm imaging, due to not able to position for the imaging due to chronic arthritis -- I have  discussed with orthopedics surgery oncall Dr Percell Miller who informed me this patient is Dr Alusio's patient, he will let allusion know about the patient's hospitalization, Dr Percell Miller also recommended hand surgery consult, I called  hand surgery office at [phone] and talked to RN Leanee, awaiting hand surgery call back. --He is started on vancomycin and Zosyn from admission, zosyn changed to rocephin due to low suspicion of pesuedomona. Follow cultures.    6/19 3pm addendum:  Hand surgery office RN called back, request patient to be npo, she is not sure when the surgery want to operate,  Patient RN informed to hold lovenox if procedure planned.  7pm addendum: case discussed with hand surgeon in person, no indication for surgery, can d/c home with abx and outpatient follow up. May repeat US if not improving to r/o abscess.   History of DVT of the lower extremity -  xarelto held since admission,  on therapeutic Lovenox in case of surgery. Patient agreeable. Resume xarelto if no surgery planned.  Severe aortic stenosis - patient has declined any intervention for this.  History of rheumatoid arthritis - hold immunosuppressants in the setting of infection.  Hypoxia: cxr unremarkable, no cough, no dyspnea,  possible OSA. (report remote h/o osa, not able to tolerate cpap in the past) Oxygen supplement at night  Depression/bipolar disorder on Geodon and Abilify and Depakote.  FTT: wheelchair bound for the last two months. Will need PT/OT eval    DVT prophylaxis: Lovenox. (home meds xarelto held ) Code Status: Full code.  Family Communication: Patient's wife at bedside Disposition Plan: Home, may need home health Consults called:  Orthopedics Copy   Procedures:  none  Antibiotics:  vanc from admission  Zosyn from admission to 6/18  Rocephin from 6/18   Objective: BP (!) 103/53 (BP Location: Right Arm)   Pulse (!) 58   Temp 98.4 F (36.9 C) (Oral)   Resp 20    SpO2 98%   Intake/Output Summary (Last 24 hours) at 01/07/17 1323 Last data filed at 01/07/17 0800  Gross per 24 hour  Intake              750 ml  Output              650 ml  Net              100 ml   There were no vitals filed for this visit.  Exam:   General:  Chronically ill appearing, NAD, pleasant  Cardiovascular: RRR  Respiratory: CTABL  Abdomen: Soft/ND/NT, positive BS  Musculoskeletal: left forearm and wrist less edema, still erythematous, less tender, slightly increased range of motion chronic bilateral lower extremity edema  Neuro: aaox3  Data Reviewed: Basic Metabolic Panel:  Recent Labs Lab 01/05/17 2318 01/06/17 0537 01/07/17 0845  NA 138 140 139  K 3.9 3.8 3.8  CL 100* 102 100*  CO2 31 33* 32  GLUCOSE 92 83 112*  BUN 22* 20 17  CREATININE 0.71 0.57* 0.62  CALCIUM 8.7* 8.5* 8.6*   Liver Function Tests:  Recent Labs Lab 01/05/17 2318  AST 26  ALT 12*  ALKPHOS 74  BILITOT 1.0  PROT 7.1  ALBUMIN 2.8*   No results for input(s): LIPASE, AMYLASE in the last 168 hours. No results for input(s): AMMONIA in the last 168 hours. CBC:  Recent Labs Lab 01/05/17 2318 01/06/17 0537 01/07/17 0845  WBC 6.0 4.9 5.1  NEUTROABS 3.3  --  2.8  HGB 13.5 12.1* 12.1*  HCT 39.5 36.1* 35.9*  MCV 92.9 90.9 91.1  PLT 140* 128* 136*   Cardiac Enzymes:   No results for input(s): CKTOTAL, CKMB, CKMBINDEX, TROPONINI in the last 168 hours. BNP (last 3 results) No results for input(s): BNP in the last 8760 hours.  ProBNP (last 3 results) No results for input(s): PROBNP in the last 8760 hours.  CBG: No results for input(s): GLUCAP in the last 168 hours.  Recent Results (from the past 240 hour(s))  MRSA PCR Screening     Status: None   Collection Time: 01/06/17 11:15 AM  Result Value Ref Range Status   MRSA by PCR NEGATIVE NEGATIVE Final    Comment:        The GeneXpert MRSA Assay (FDA approved for NASAL specimens only), is one component of  a comprehensive MRSA colonization surveillance program. It is not intended to diagnose MRSA infection nor to guide or monitor treatment for MRSA infections.      Studies: No results found.  Scheduled Meds: . ARIPiprazole  30 mg Oral QHS  . aspirin EC  81 mg Oral Daily  . calcium-vitamin D  1 tablet Oral Q breakfast  . clotrimazole   Topical BID  . divalproex  2,000 mg Oral QHS  . enoxaparin (LOVENOX) injection  100 mg Subcutaneous Q12H  . folic acid  1 mg Oral Daily  . gabapentin  300 mg Oral BID  . metoprolol succinate  12.5 mg Oral Daily  . mirabegron ER  50 mg Oral Daily  . oxybutynin  5 mg Oral BID  . pantoprazole  40 mg Oral Daily  . polyethylene glycol  17 g Oral BID  . senna  1 tablet Oral QHS  . trimethoprim-polymyxin b  1 drop Both Eyes Q4H  . vitamin C  500 mg Oral Daily  . ziprasidone  60 mg Oral QHS    Continuous Infusions: . cefTRIAXone (ROCEPHIN)  IV Stopped (01/06/17 1617)  . vancomycin 1,000 mg (01/07/17 0500)     Time spent: 73mins   Winifred Balogh MD, PhD  [facility] Pager [phone]. If 7PM-7AM, please contact night-coverage at [email or web address], password [facility] 01/07/2017, 1:23 PM  LOS: 1 day

## 2017-01-08 DIAGNOSIS — L03114 Cellulitis of left upper limb: Principal | ICD-10-CM

## 2017-01-08 LAB — BASIC METABOLIC PANEL WITH GFR
Anion gap: 7 (ref 5–15)
BUN: 13 mg/dL (ref 6–20)
CO2: 31 mmol/L (ref 22–32)
Calcium: 8.5 mg/dL — ABNORMAL LOW (ref 8.9–10.3)
Chloride: 100 mmol/L — ABNORMAL LOW (ref 101–111)
Creatinine, Ser: 0.58 mg/dL — ABNORMAL LOW (ref 0.61–1.24)
GFR calc Af Amer: >60 mL/min
GFR calc non Af Amer: >60 mL/min
Glucose, Bld: 86 mg/dL (ref 65–99)
Potassium: 4.3 mmol/L (ref 3.5–5.1)
Sodium: 138 mmol/L (ref 135–145)

## 2017-01-08 LAB — SEDIMENTATION RATE: Sed Rate: 95 mm/hr — ABNORMAL HIGH (ref 0–16)

## 2017-01-08 LAB — MAGNESIUM: Magnesium: 1.6 mg/dL — ABNORMAL LOW (ref 1.7–2.4)

## 2017-01-08 LAB — C-REACTIVE PROTEIN: CRP: 5.6 mg/dL — ABNORMAL HIGH

## 2017-01-08 MED ORDER — CEPHALEXIN 500 MG PO CAPS: 500.0000 mg | ORAL_CAPSULE | Freq: Four times a day (QID) | ORAL | 0 refills | Status: DC

## 2017-01-08 MED ORDER — CEPHALEXIN 500 MG PO CAPS
500.0000 mg | ORAL_CAPSULE | Freq: Four times a day (QID) | ORAL | Status: DC
  Administered 2017-01-08: 500 mg via ORAL
  Filled 2017-01-08: qty 1

## 2017-01-08 MED ORDER — RIVAROXABAN 20 MG PO TABS
20.0000 mg | ORAL_TABLET | Freq: Every day | ORAL | Status: DC
  Administered 2017-01-08: 20 mg via ORAL
  Filled 2017-01-08: qty 1

## 2017-01-08 NOTE — Discharge Instructions (Signed)

## 2017-01-08 NOTE — Progress Notes (Signed)
Date: January 08, 2017 Chart reviewed for discharge orders: None found for case management. Vernia Buff, [phone]

## 2017-01-08 NOTE — Discharge Summary (Signed)
Physician Discharge Summary  Shaun Yoder UVO:[id number] DOB: 1946-05-04 DOA: 01/05/2017  PCP: Colon Branch, MD  Admit date: 01/05/2017 Discharge date: 01/08/2017  Admitted From: Home  Disposition:  Home  Recommendations for Outpatient Follow-up:  1. Follow up with PCP in 1-2 weeks 2. Please obtain BMP/CBC in one week 3. Follow-up with Dr. Leanora Cover as needed as an outpatient  Home Health: No Equipment/Devices: None Discharge Condition: Stable  CODE STATUS: Full Diet recommendation: Heart Healthy  Brief/Interim Summary: 71 y.o. male with history of rheumatoid arthritis wheelchair-bound, severe aortic stenosis, history of DVT on xarelto and history of bipolar disorder was brought to the ER the patient was having increasing swelling and pain over the left forearm and wrist over the last 3 days. She was admitted with diagnosis of cellulitis and started on intravenous antibiotics. He was evaluated by Dr. Leanora Cover who was of the opinion that patient did not need incision and drainage, he recommended outpatient follow-up in office after discharge. MRI/ CT scan could not be done because patient could not be positioned because of his rheumatoid arthritis. His cellulitis has improved.   Discharge Diagnoses:  Principal Problem:   Cellulitis of left upper extremity Active Problems:   Schizoaffective disorder, bipolar type (HCC)   Rheumatoid arthritis ([place])   Left leg DVT (HCC)   Cellulitis  Cellulitis of the left upper extremity (left forearm, wrist, hand) in an immunosuppressed individual -   --X ray of left hand " Progressive inflammatory arthritis involving the second and third metacarpal phalangeal joints from 2011 exam (most recent comparison). Findings likely represent progression of rheumatoid arthritis, however superimposed infection difficult to exclude given the diffuse soft tissue edema." --X ray of left wrist: " 1. Multifocal erosions involving the distal radius and ulna,  progressed from 2011 (most recent comparison exam) with remottling of the distal radioulnar joint. Findings suggest sequela of rheumatoid arthritis, difficult to exclude acute infection given the soft tissue edema. 2. Chronic loss of normal scapholunate interval in settling of the carpals. 3. Multifocal cystic change versus erosions throughout the carpal bones, with mild progression from prior exam" --venous US left arm negative for DVT --he could not tolerate mri or Ct for left forearm imaging, due to not able to position for the imaging due to chronic arthritis -Patient currently on vancomycin and Rocephin. Cellulitis has much improved. Dr. Leanora Cover evaluated the patient and recommended outpatient follow-up and no need for inpatient incision and drainage - Discharged home on Keflex 500 mg by mouth 4 times a day for 5 days and outpatient follow-up with primary care provider regarding need for more antibiotics.   History of DVT of the lower extremity -   restart home Xarelto Severe aortic stenosis - patient has declined any intervention for this. Outpatient follow-up  History of rheumatoid arthritis - outpatient follow-up.   Hypoxia: Resolved. Outpatient follow-up  Depression/bipolar disorder: Continue Geodon and Abilify and Depakote.    Discharge Instructions  Discharge Instructions    Call MD for:  difficulty breathing, headache or visual disturbances    Complete by:  As directed    Call MD for:  extreme fatigue    Complete by:  As directed    Call MD for:  hives    Complete by:  As directed    Call MD for:  persistant dizziness or light-headedness    Complete by:  As directed    Call MD for:  persistant nausea and vomiting    Complete by:  As directed    Call MD for:  severe uncontrolled pain    Complete by:  As directed    Call MD for:  temperature >100.4    Complete by:  As directed    Diet - low sodium heart healthy    Complete by:  As directed    Increase  activity slowly    Complete by:  As directed      Allergies as of 01/08/2017      Reactions   Leflunomide Other (See Comments)   diarrhea   Morphine And Related Nausea And Vomiting   Plaquenil [hydroxychloroquine Sulfate]    rash      Medication List    TAKE these medications   Abatacept 125 MG/ML Sosy Inject into the skin once a week. Reported on 10/24/2015   acetaminophen-codeine 300-30 MG tablet Commonly known as:  TYLENOL #3 Take 1 tablet by mouth every 4 (four) hours as needed for moderate pain.   ARIPiprazole 30 MG tablet Commonly known as:  ABILIFY Take 30 mg by mouth at bedtime.   aspirin EC 81 MG tablet Take 81 mg by mouth daily.   CALCIUM 500/D 500-200 MG-UNIT tablet Generic drug:  calcium-vitamin D Take by mouth.   cephALEXin 500 MG capsule Commonly known as:  KEFLEX Take 1 capsule (500 mg total) by mouth every 6 (six) hours.   clotrimazole 1 % cream Commonly known as:  LOTRIMIN Apply 1 application topically 2 (two) times daily.   divalproex 500 MG 24 hr tablet Commonly known as:  DEPAKOTE ER 4 tabs at bedtime   folic acid 1 MG tablet Commonly known as:  FOLVITE Take 1 mg by mouth daily.   gabapentin 300 MG capsule Commonly known as:  NEURONTIN Take 300 mg by mouth 2 (two) times daily.   hydrocortisone cream 1 % Apply on affected areas on face and behind ears twice daily for 14 days.   hydrocortisone valerate cream 0.2 % Commonly known as:  WESTCORT Apply 1 application topically 2 (two) times daily.   ketoconazole 2 % shampoo Commonly known as:  NIZORAL Lather and massage into scalp 2-3 times per week. Leave for 5 minutes before rinsing.   methotrexate 2.5 MG tablet Commonly known as:  RHEUMATREX Take 17.5 mg by mouth once a week. Reported on 12/06/2015   metoprolol succinate 25 MG 24 hr tablet Commonly known as:  TOPROL-XL Take 12.5 mg by mouth daily.   mirabegron ER 50 MG Tb24 tablet Commonly known as:  MYRBETRIQ Take 50 mg by mouth  daily.   Omega-3 1000 MG Caps Take by mouth.   omeprazole 20 MG capsule Commonly known as:  PRILOSEC Take 20 mg by mouth every morning.   oxybutynin 5 MG tablet Commonly known as:  DITROPAN Take 5 mg by mouth 2 (two) times daily.   trimethoprim-polymyxin b ophthalmic solution Commonly known as:  POLYTRIM Place 1 drop into both eyes every 4 (four) hours.   vitamin C 500 MG tablet Commonly known as:  ASCORBIC ACID Take 500 mg by mouth daily.   XARELTO 20 MG Tabs tablet Generic drug:  rivaroxaban TAKE 1 TABLET (20 MG TOTAL) BY MOUTH DAILY WITH SUPPER.   ziprasidone 60 MG capsule Commonly known as:  GEODON Take 60 mg by mouth daily.      Follow-up Information    Daryll Brod, MD Follow up.   Specialty:  Orthopedic Surgery Why:  as needed for left hand/wrist  Contact information: [address] [place] [address]  [place], MD Follow up in 1 week(s).   Specialty:  Internal Medicine Contact information: [address] [place] [address] [phone]          Allergies  Allergen Reactions  . Leflunomide Other (See Comments)    diarrhea  . Morphine And Related Nausea And Vomiting  . Plaquenil [Hydroxychloroquine Sulfate]     rash    Consultations:  Orthopedic/Hand surgery   Procedures/Studies: Dg Wrist Complete Left  Result Date: 01/05/2017 CLINICAL DATA:  Acute onset of left hand and wrist pain, swelling and redness for 2 days. History of rheumatoid arthritis. EXAM: LEFT WRIST - COMPLETE 3+ VIEW COMPARISON:  Bilateral hand radiographs 07/05/2010 FINDINGS: Progressive erosions involving the distal radius primarily involving the radial styloid. Erosions involving the distal ulna appear similar to prior exam. Interval remottling of the distal radioulnar joint with loss of normal scapholunate interval and settling of the carpals. Multifocal cystic change versus erosions involving the carpal bones, likely progressed  from prior, direct comparison difficult due to differences in technique. No evidence of acute fracture. There is diffuse soft tissue edema about the wrist. Vascular calcifications are seen. IMPRESSION: 1. Multifocal erosions involving the distal radius and ulna, progressed from 2011 (most recent comparison exam) with remottling of the distal radioulnar joint. Findings suggest sequela of rheumatoid arthritis, difficult to exclude acute infection given the soft tissue edema. 2. Chronic loss of normal scapholunate interval in settling of the carpals. 3. Multifocal cystic change versus erosions throughout the carpal bones, with mild progression from prior exam. Electronically Signed   By: Jeb Levering M.D.   On: 01/05/2017 22:36   Dg Hand 2 View Left  Result Date: 01/05/2017 CLINICAL DATA:  Acute onset of left hand and wrist pain, swelling and redness for 2 days. History rheumatoid arthritis. EXAM: LEFT HAND - 2 VIEW COMPARISON:  Bilateral hand radiographs 07/05/2010 FINDINGS: Multifocal erosions throughout the left hand and wrist. Progressive erosions and joint space narrowing of the second and third metacarpal phalangeal joints compared with radiographs from 2011. Erosive change involving carpal bones and wrist are better assessed on concurrent wrist radiographs. There is mild osteoarthritis of the thumb interphalangeal joint. Second digit held in flexion at the distal interphalangeal joint limiting assessment. Diffuse soft tissue edema about the hand and wrist. No evidence of soft tissue air. Vascular calcifications are seen. IMPRESSION: Progressive inflammatory arthritis involving the second and third metacarpal phalangeal joints from 2011 exam (most recent comparison). Findings likely represent progression of rheumatoid arthritis, however superimposed infection difficult to exclude given the diffuse soft tissue edema. Wrist findings better assessed on concurrent wrist series. Electronically Signed   By:  Jeb Levering M.D.   On: 01/05/2017 22:32   US Venous Img Lower Unilateral Left  Result Date: 12/23/2016 CLINICAL DATA:  Left groin pain. EXAM: LEFT LOWER EXTREMITY VENOUS DOPPLER ULTRASOUND TECHNIQUE: Gray-scale sonography with graded compression, as well as color Doppler and duplex ultrasound were performed to evaluate the lower extremity deep venous systems from the level of the common femoral vein and including the common femoral, femoral, profunda femoral, popliteal and calf veins including the posterior tibial, peroneal and gastrocnemius veins when visible. The superficial great saphenous vein was also interrogated. Spectral Doppler was utilized to evaluate flow at rest and with distal augmentation maneuvers in the common femoral, femoral and popliteal veins. COMPARISON:  None. FINDINGS: Contralateral Common Femoral Vein: Respiratory phasicity is normal and symmetric with  the symptomatic side. No evidence of thrombus. Normal compressibility. Common Femoral Vein: No evidence of thrombus. Normal compressibility, respiratory phasicity and response to augmentation. Saphenofemoral Junction: No evidence of thrombus. Normal compressibility and flow on color Doppler imaging. Profunda Femoral Vein: No evidence of thrombus. Normal compressibility and flow on color Doppler imaging. Femoral Vein: No evidence of thrombus. Normal compressibility, respiratory phasicity and response to augmentation. Popliteal Vein: No evidence of thrombus. Normal compressibility, respiratory phasicity and response to augmentation. Calf Veins: No evidence of thrombus. Peroneal vein was not visualized. Normal compressibility and flow on color Doppler imaging. Superficial Great Saphenous Vein: No evidence of thrombus. Normal compressibility and flow on color Doppler imaging. Venous Reflux:  None. Other Findings:  None. IMPRESSION: No evidence of DVT within the left lower extremity. Electronically Signed   By: Lorriane Shire M.D.   On:  12/23/2016 20:29   US Venous Img Upper Uni Left  Result Date: 01/05/2017 CLINICAL DATA:  Left forearm swelling. EXAM: LEFT UPPER EXTREMITY VENOUS DOPPLER ULTRASOUND TECHNIQUE: Gray-scale sonography with graded compression, as well as color Doppler and duplex ultrasound were performed to evaluate the upper extremity deep venous system from the level of the subclavian vein and including the jugular, axillary, basilic, radial, ulnar and upper cephalic vein. Spectral Doppler was utilized to evaluate flow at rest and with distal augmentation maneuvers. COMPARISON:  None. FINDINGS: Contralateral Subclavian Vein: Respiratory phasicity is normal and symmetric with the symptomatic side. No evidence of thrombus. Normal compressibility. Internal Jugular Vein: No evidence of thrombus. Normal compressibility, respiratory phasicity and response to augmentation. Subclavian Vein: No evidence of thrombus. Normal compressibility, respiratory phasicity and response to augmentation. Axillary Vein: No evidence of thrombus. Normal compressibility, respiratory phasicity and response to augmentation. Cephalic Vein: Normal compressibility. The response to augmentation could not be determined due to tenderness. Doppler flow could not be documented. Basilic Vein: No evidence of thrombus. Normal compressibility, respiratory phasicity and response to augmentation. Brachial Veins: No evidence of thrombus. Normal compressibility, respiratory phasicity and response to augmentation. Radial Veins: No evidence of thrombus. Normal compressibility, respiratory phasicity and response to augmentation. Ulnar Veins: No evidence of thrombus. Normal compressibility, respiratory phasicity and response to augmentation. Venous Reflux:  None visualized. Other Findings:  None visualized. IMPRESSION: Doppler flow could not be documented within the cephalic vein, although it was normally compressible. Response to augmentation could not be assessed due to pain.  Otherwise, no evidence of DVT in the left upper extremity. Electronically Signed   By: Fidela Salisbury M.D.   On: 01/05/2017 22:24   Dg Chest Port 1 View  Result Date: 01/06/2017 CLINICAL DATA:  Left forearm swelling EXAM: PORTABLE CHEST 1 VIEW COMPARISON:  01/22/2016 FINDINGS: Small area of linear scarring at the left lung base. There is no focal parenchymal opacity. There is no pleural effusion or pneumothorax. The heart and mediastinal contours are unremarkable. Severe chronic osteoarthritis and fragmentation of bilateral shoulders. IMPRESSION: No active disease. Electronically Signed   By: Kathreen Devoid   On: 01/06/2017 13:35       Subjective: Patient seen and examined at bedside. He feels much better. His left hand redness is improved. No overnight fever, nausea, vomiting.  Discharge Exam: Vitals:   01/07/17 1900 01/08/17 0551  BP: 129/70 112/69  Pulse:  62  Resp: 20 20  Temp: 98.8 F (37.1 C) 98 F (36.7 C)   Vitals:   01/07/17 0545 01/07/17 1328 01/07/17 1900 01/08/17 0551  BP: (!) 103/53 121/63 129/70 112/69  Pulse: (!) 58 (!)  53  62  Resp: 20  20 20   Temp: 98.4 F (36.9 C) 98.4 F (36.9 C) 98.8 F (37.1 C) 98 F (36.7 C)  TempSrc: Oral Oral Oral Oral  SpO2: 98% 98% 96% 95%    General: Pt is alert, awake, not in acute distress Cardiovascular:S1 and S2 positive, rate controlled Respiratory: Bilateral decreased breath sounds at bases  Abdominal: Soft, NT, ND, bowel sounds + Extremities: Left wrist and forearm is devoid of any erythema. Mild swelling present. no tenderness  The results of significant diagnostics from this hospitalization (including imaging, microbiology, ancillary and laboratory) are listed below for reference.     Microbiology: Recent Results (from the past 240 hour(s))  Blood culture (routine x 2)     Status: None (Preliminary result)   Collection Time: 01/05/17 11:28 PM  Result Value Ref Range Status   Specimen Description BLOOD RIGHT ARM   Final   Special Requests   Final    BOTTLES DRAWN AEROBIC AND ANAEROBIC Blood Culture adequate volume   Culture   Final    NO GROWTH 1 DAY Performed at [facility], [address], [place], [place] [address]    Report Status PENDING  Incomplete  Blood culture (routine x 2)     Status: None (Preliminary result)   Collection Time: 01/05/17 11:29 PM  Result Value Ref Range Status   Specimen Description BLOOD RIGHT HAND  Final   Special Requests   Final    BOTTLES DRAWN AEROBIC AND ANAEROBIC Blood Culture adequate volume   Culture   Final    NO GROWTH 1 DAY Performed at [facility], [place] [address] [place],  [address]    Report Status PENDING  Incomplete  MRSA PCR Screening     Status: None   Collection Time: 01/06/17 11:15 AM  Result Value Ref Range Status   MRSA by PCR NEGATIVE NEGATIVE Final    Comment:        The GeneXpert MRSA Assay (FDA approved for NASAL specimens only), is one component of a comprehensive MRSA colonization surveillance program. It is not intended to diagnose MRSA infection nor to guide or monitor treatment for MRSA infections.      Labs: BNP (last 3 results) No results for input(s): BNP in the last 8760 hours. Basic Metabolic Panel:  Recent Labs Lab 01/05/17 2318 01/06/17 0537 01/07/17 0845  NA 138 140 139  K 3.9 3.8 3.8  CL 100* 102 100*  CO2 31 33* 32  GLUCOSE 92 83 112*  BUN 22* 20 17  CREATININE 0.71 0.57* 0.62  CALCIUM 8.7* 8.5* 8.6*   Liver Function Tests:  Recent Labs Lab 01/05/17 2318  AST 26  ALT 12*  ALKPHOS 74  BILITOT 1.0  PROT 7.1  ALBUMIN 2.8*   No results for input(s): LIPASE, AMYLASE in the last 168 hours. No results for input(s): AMMONIA in the last 168 hours. CBC:  Recent Labs Lab 01/05/17 2318 01/06/17 0537 01/07/17 0845  WBC 6.0 4.9 5.1  NEUTROABS 3.3  --  2.8  HGB 13.5 12.1* 12.1*  HCT 39.5 36.1* 35.9*  MCV 92.9 90.9 91.1  PLT 140* 128* 136*   Cardiac Enzymes: No results  for input(s): CKTOTAL, CKMB, CKMBINDEX, TROPONINI in the last 168 hours. BNP: Invalid input(s): POCBNP CBG: No results for input(s): GLUCAP in the last 168 hours. D-Dimer No results for input(s): DDIMER in the last 72 hours. Hgb A1c No results for input(s): HGBA1C in the last 72  hours. Lipid Profile No results for input(s): CHOL, HDL, LDLCALC, TRIG, CHOLHDL, LDLDIRECT in the last 72 hours. Thyroid function studies No results for input(s): TSH, T4TOTAL, T3FREE, THYROIDAB in the last 72 hours.  Invalid input(s): FREET3 Anemia work up No results for input(s): VITAMINB12, FOLATE, FERRITIN, TIBC, IRON, RETICCTPCT in the last 72 hours. Urinalysis    Component Value Date/Time   COLORURINE ORANGE (A) 01/06/2017 0019   APPEARANCEUR CLEAR 01/06/2017 0019   LABSPEC 1.029 01/06/2017 0019   PHURINE 5.5 01/06/2017 0019   GLUCOSEU NEGATIVE 01/06/2017 0019   GLUCOSEU NEGATIVE 04/03/2016 1453   HGBUR NEGATIVE 01/06/2017 0019   BILIRUBINUR MODERATE (A) 01/06/2017 0019   BILIRUBINUR neg 04/03/2016 1549   KETONESUR 15 (A) 01/06/2017 0019   PROTEINUR NEGATIVE 01/06/2017 0019   UROBILINOGEN 1.0 04/03/2016 1549   UROBILINOGEN 2.0 (A) 04/03/2016 1453   NITRITE NEGATIVE 01/06/2017 0019   LEUKOCYTESUR SMALL (A) 01/06/2017 0019   Sepsis Labs Invalid input(s): PROCALCITONIN,  WBC,  LACTICIDVEN Microbiology Recent Results (from the past 240 hour(s))  Blood culture (routine x 2)     Status: None (Preliminary result)   Collection Time: 01/05/17 11:28 PM  Result Value Ref Range Status   Specimen Description BLOOD RIGHT ARM  Final   Special Requests   Final    BOTTLES DRAWN AEROBIC AND ANAEROBIC Blood Culture adequate volume   Culture   Final    NO GROWTH 1 DAY Performed at [facility], [place] [address] [place], [place] [address]    Report Status PENDING  Incomplete  Blood culture (routine x 2)     Status: None (Preliminary result)   Collection Time: 01/05/17 11:29 PM  Result Value Ref  Range Status   Specimen Description BLOOD RIGHT HAND  Final   Special Requests   Final    BOTTLES DRAWN AEROBIC AND ANAEROBIC Blood Culture adequate volume   Culture   Final    NO GROWTH 1 DAY Performed at [facility], [place] [address] [place], [place] [address]    Report Status PENDING  Incomplete  MRSA PCR Screening     Status: None   Collection Time: 01/06/17 11:15 AM  Result Value Ref Range Status   MRSA by PCR NEGATIVE NEGATIVE Final    Comment:        The GeneXpert MRSA Assay (FDA approved for NASAL specimens only), is one component of a comprehensive MRSA colonization surveillance program. It is not intended to diagnose MRSA infection nor to guide or monitor treatment for MRSA infections.      Time coordinating discharge: 35 minutes  SIGNED:   Aline August, MD  [facility] 01/08/2017, 10:35 AM Pager: [phone]  If 7PM-7AM, please contact night-coverage [email or web address] Password [email or web address]

## 2017-01-09 ENCOUNTER — Telehealth: Payer: Self-pay | Admitting: Behavioral Health

## 2017-01-09 DIAGNOSIS — Z87891 Personal history of nicotine dependence: Secondary | ICD-10-CM | POA: Diagnosis not present

## 2017-01-09 DIAGNOSIS — M1990 Unspecified osteoarthritis, unspecified site: Secondary | ICD-10-CM | POA: Diagnosis not present

## 2017-01-09 DIAGNOSIS — I872 Venous insufficiency (chronic) (peripheral): Secondary | ICD-10-CM | POA: Diagnosis not present

## 2017-01-09 DIAGNOSIS — L97822 Non-pressure chronic ulcer of other part of left lower leg with fat layer exposed: Secondary | ICD-10-CM | POA: Diagnosis not present

## 2017-01-09 DIAGNOSIS — I890 Lymphedema, not elsewhere classified: Secondary | ICD-10-CM | POA: Diagnosis not present

## 2017-01-09 DIAGNOSIS — Z7982 Long term (current) use of aspirin: Secondary | ICD-10-CM | POA: Diagnosis not present

## 2017-01-09 DIAGNOSIS — L97212 Non-pressure chronic ulcer of right calf with fat layer exposed: Secondary | ICD-10-CM | POA: Diagnosis not present

## 2017-01-09 LAB — LYME, WESTERN BLOT, SERUM (REFLEXED)
IGG P18 AB.: ABSENT
IGG P66 AB.: ABSENT
IGM P23 AB.: ABSENT
IgG P23 Ab.: ABSENT
IgG P28 Ab.: ABSENT
IgG P30 Ab.: ABSENT
IgG P39 Ab.: ABSENT
IgG P93 Ab.: ABSENT
IgM P39 Ab.: ABSENT
IgM P41 Ab.: ABSENT
Lyme IgG Wb: NEGATIVE
Lyme IgM Wb: NEGATIVE

## 2017-01-09 LAB — EHRLICHIA ANTIBODY PANEL
E CHAFFEENSIS AB, IGG: NEGATIVE
E CHAFFEENSIS AB, IGM: NEGATIVE
E. CHAFFEENSIS (HME) IGM TITER: NEGATIVE
E.Chaffeensis (HME) IgG: NEGATIVE

## 2017-01-09 LAB — B. BURGDORFI ANTIBODIES: B BURGDORFERI AB IGG+ IGM: 1.11 {ISR} — AB (ref 0.00–0.90)

## 2017-01-09 LAB — ROCKY MTN SPOTTED FVR ABS PNL(IGG+IGM)
RMSF IGG: NEGATIVE
RMSF IgM: 0.28 index (ref 0.00–0.89)

## 2017-01-09 NOTE — Telephone Encounter (Signed)
Attempted to reach patient for TCM/Hospital Follow-up call. Left message for patient to return call when available.    

## 2017-01-10 NOTE — Telephone Encounter (Signed)
Per the patient's wife, her husband currently has a follow-up appointment in place for January 21, 2017 & they would like to just wait & see the provider at that time; [facility] follow-up declined.

## 2017-01-11 LAB — CULTURE, BLOOD (ROUTINE X 2)
CULTURE: NO GROWTH
CULTURE: NO GROWTH
SPECIAL REQUESTS: ADEQUATE
Special Requests: ADEQUATE

## 2017-01-13 ENCOUNTER — Telehealth: Payer: Self-pay | Admitting: Internal Medicine

## 2017-01-13 NOTE — Telephone Encounter (Signed)
Shaun Yoder at [facility] in [place] called for progress notes and script for med recently changed for Toprolol from 25 mg to 12.5. Fax [phone] and ph for laura [phone] ext [phone]. She states she will fax the req here as well.

## 2017-01-13 NOTE — Telephone Encounter (Signed)
Looks like Metoprolol was changed in hosp admission 01/05/2017- and we have not seen him since discharge, also we are unable to print and disperse records from inpatient admissions- Pt will need to call medical records at [facility].

## 2017-01-15 NOTE — Telephone Encounter (Signed)
Received request for Medical records post Hospital admission; informed the same as message below from PCP's CMA; faxed back to [facility] 06/27

## 2017-01-16 DIAGNOSIS — Z7901 Long term (current) use of anticoagulants: Secondary | ICD-10-CM | POA: Diagnosis not present

## 2017-01-16 DIAGNOSIS — M1990 Unspecified osteoarthritis, unspecified site: Secondary | ICD-10-CM | POA: Diagnosis not present

## 2017-01-16 DIAGNOSIS — Z7982 Long term (current) use of aspirin: Secondary | ICD-10-CM | POA: Diagnosis not present

## 2017-01-16 DIAGNOSIS — L97821 Non-pressure chronic ulcer of other part of left lower leg limited to breakdown of skin: Secondary | ICD-10-CM | POA: Diagnosis not present

## 2017-01-16 DIAGNOSIS — D090 Carcinoma in situ of bladder: Secondary | ICD-10-CM | POA: Diagnosis not present

## 2017-01-16 DIAGNOSIS — Z87891 Personal history of nicotine dependence: Secondary | ICD-10-CM | POA: Diagnosis not present

## 2017-01-17 DIAGNOSIS — N476 Balanoposthitis: Secondary | ICD-10-CM | POA: Diagnosis not present

## 2017-01-17 DIAGNOSIS — R310 Gross hematuria: Secondary | ICD-10-CM | POA: Diagnosis not present

## 2017-01-17 DIAGNOSIS — C678 Malignant neoplasm of overlapping sites of bladder: Secondary | ICD-10-CM | POA: Diagnosis not present

## 2017-01-21 ENCOUNTER — Telehealth: Payer: Self-pay

## 2017-01-21 ENCOUNTER — Ambulatory Visit (INDEPENDENT_AMBULATORY_CARE_PROVIDER_SITE_OTHER): Payer: Medicare Other | Admitting: Internal Medicine

## 2017-01-21 ENCOUNTER — Encounter: Payer: Self-pay | Admitting: Internal Medicine

## 2017-01-21 VITALS — BP 126/68 | HR 58 | Temp 98.3°F | Resp 14 | Ht 61.0 in | Wt 199.0 lb

## 2017-01-21 DIAGNOSIS — R0902 Hypoxemia: Secondary | ICD-10-CM | POA: Diagnosis not present

## 2017-01-21 DIAGNOSIS — L03114 Cellulitis of left upper limb: Secondary | ICD-10-CM | POA: Diagnosis not present

## 2017-01-21 DIAGNOSIS — R310 Gross hematuria: Secondary | ICD-10-CM | POA: Diagnosis not present

## 2017-01-21 LAB — BASIC METABOLIC PANEL
BUN: 18 mg/dL (ref 7–25)
CALCIUM: 8.3 mg/dL — AB (ref 8.6–10.3)
CO2: 28 mmol/L (ref 20–31)
Chloride: 103 mmol/L (ref 98–110)
Creat: 0.59 mg/dL — ABNORMAL LOW (ref 0.70–1.18)
GLUCOSE: 82 mg/dL (ref 65–99)
Potassium: 4.2 mmol/L (ref 3.5–5.3)
Sodium: 141 mmol/L (ref 135–146)

## 2017-01-21 LAB — CBC WITH DIFFERENTIAL/PLATELET
BASOS ABS: 0 {cells}/uL (ref 0–200)
Basophils Relative: 0 %
EOS PCT: 2 %
Eosinophils Absolute: 116 cells/uL (ref 15–500)
HCT: 35.6 % — ABNORMAL LOW (ref 38.5–50.0)
HEMOGLOBIN: 11.6 g/dL — AB (ref 13.2–17.1)
LYMPHS ABS: 1508 {cells}/uL (ref 850–3900)
Lymphocytes Relative: 26 %
MCH: 30.5 pg (ref 27.0–33.0)
MCHC: 32.6 g/dL (ref 32.0–36.0)
MCV: 93.7 fL (ref 80.0–100.0)
MPV: 8.7 fL (ref 7.5–12.5)
Monocytes Absolute: 754 cells/uL (ref 200–950)
Monocytes Relative: 13 %
NEUTROS PCT: 59 %
Neutro Abs: 3422 cells/uL (ref 1500–7800)
Platelets: 170 10*3/uL (ref 140–400)
RBC: 3.8 MIL/uL — ABNORMAL LOW (ref 4.20–5.80)
RDW: 15.6 % — ABNORMAL HIGH (ref 11.0–15.0)
WBC: 5.8 10*3/uL (ref 3.8–10.8)

## 2017-01-21 NOTE — Patient Instructions (Addendum)
GO TO THE LAB : Get the blood work     GO TO THE FRONT DESK Schedule your next appointment for a  routine checkup by September 2018

## 2017-01-21 NOTE — Telephone Encounter (Signed)
Overnight oximetry orders faxed to [facility] at [phone].

## 2017-01-21 NOTE — Progress Notes (Signed)
Pre visit review using our clinic review tool, if applicable. No additional management support is needed unless otherwise documented below in the visit note. 

## 2017-01-21 NOTE — Progress Notes (Signed)
Subjective:    Patient ID: Shaun Yoder, male    DOB: [birth year], 71 y.o.   MRN: [id number]  DOS:  01/21/2017 Type of visit - description : TCM 14 Interval history: Was admitted to the hospital and discharged 01/08/2017, was seen with swelling and pain at the left forearm and wrist. dx cellulitis, s/p IV ABX. Orthopedics consulted, no need for I&D. Unable to do MRIs or CTs. Ultrasound was negative for DVT left arm. Discharge on Keflex   Since he left the hospital he is better. Finished  the antibiotics The area looks better.  Also, went to see urology 01/08/2017 due to gross hematuria, urine culture + for infection, was prescribed Levaquin yesterday. Overall urine looks better today. Also developed a yeast infection at the penis, prescribed clotrimazole by urology.  Review of Systems  Denies fever chills No nausea, vomiting, diarrhea.  Past Medical History:  Diagnosis Date  . Anemia   . Cancer ([place])    bladder  . Colitis   . Colon polyps   . Depression   . Diverticulosis   . DVT of deep femoral vein ([place])   . Gastritis   . GERD (gastroesophageal reflux disease)   . History of rectal polyps   . Hyperlipidemia   . Hypertension   . Iron deficiency   . Low back pain   . OA (osteoarthritis)   . Osteonecrosis of shoulder region ([place])   . Schizoaffective disorder, bipolar type ([place])   . Sleep apnea   . Varicose vein     Past Surgical History:  Procedure Laterality Date  . BLADDER SURGERY    . TOTAL KNEE ARTHROPLASTY Bilateral   . VARICOSE VEIN SURGERY      Social History   Social History  . Marital status: Married    Spouse name: N/A  . Number of children: 2  . Years of education: N/A   Occupational History  . retired    Social History Main Topics  . Smoking status: Former Smoker    Packs/day: 2.50    Years: 43.00    Types: Cigarettes    Quit date: 07/22/1998  . Smokeless tobacco: Never Used  . Alcohol use 0.0 oz/week     Comment: Occasional glass of  wine  . Drug use: No  . Sexual activity: Not on file   Other Topics Concern  . Not on file   Social History Narrative   Originally from [place]. Moved to [facility] in July 22, [place]. 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 No known asbestos exposure. 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 No international travel. Has a dog currently. Remote exposure to Cockatiel in a different home. No mold exposure. No hot tub exposure.       Two adopted children      Allergies as of 01/21/2017      Reactions   Leflunomide Other (See Comments)   diarrhea   Morphine And Related Nausea And Vomiting   Plaquenil [hydroxychloroquine Sulfate]    rash      Medication List       Accurate as of 01/21/17 11:59 PM. Always use your most recent med list.          Abatacept 125 MG/ML Sosy Inject into the skin once a week. Reported on 10/24/2015   acetaminophen-codeine 300-30 MG tablet Commonly known  as:  TYLENOL #3 Take 1 tablet by mouth every 4 (four) hours as needed for moderate pain.   ARIPiprazole 30 MG tablet Commonly known as:  ABILIFY Take 30 mg by mouth at bedtime.   aspirin EC 81 MG tablet Take 81 mg by mouth daily.   CALCIUM 500/D 500-200 MG-UNIT tablet Generic drug:  calcium-vitamin D Take by mouth.   divalproex 500 MG 24 hr tablet Commonly known as:  DEPAKOTE ER 4 tabs at bedtime   folic acid 1 MG tablet Commonly known as:  FOLVITE Take 1 mg by mouth daily.   gabapentin 300 MG capsule Commonly known as:  NEURONTIN Take 300 mg by mouth 2 (two) times daily.   hydrocortisone cream 1 % Apply on affected areas on face and behind ears twice daily for 14 days.   hydrocortisone valerate cream 0.2 % Commonly known as:  WESTCORT Apply 1 application topically 2 (two) times daily.   ketoconazole 2 % shampoo Commonly known as:   NIZORAL Lather and massage into scalp 2-3 times per week. Leave for 5 minutes before rinsing.   levofloxacin 500 MG tablet Commonly known as:  LEVAQUIN Take 1 tablet by mouth daily.   methotrexate 2.5 MG tablet Commonly known as:  RHEUMATREX Take 17.5 mg by mouth once a week. Reported on 12/06/2015   metoprolol succinate 25 MG 24 hr tablet Commonly known as:  TOPROL-XL Take 12.5 mg by mouth daily.   mirabegron ER 50 MG Tb24 tablet Commonly known as:  MYRBETRIQ Take 50 mg by mouth daily.   Omega-3 1000 MG Caps Take by mouth.   omeprazole 20 MG capsule Commonly known as:  PRILOSEC Take 20 mg by mouth every morning.   oxybutynin 5 MG tablet Commonly known as:  DITROPAN Take 5 mg by mouth 2 (two) times daily.   trimethoprim-polymyxin b ophthalmic solution Commonly known as:  POLYTRIM Place 1 drop into both eyes every 4 (four) hours.   vitamin C 500 MG tablet Commonly known as:  ASCORBIC ACID Take 500 mg by mouth daily.   XARELTO 20 MG Tabs tablet Generic drug:  rivaroxaban TAKE 1 TABLET (20 MG TOTAL) BY MOUTH DAILY WITH SUPPER.   ziprasidone 60 MG capsule Commonly known as:  GEODON Take 60 mg by mouth daily.          Objective:   Physical Exam BP 126/68 (BP Location: Left Arm, Patient Position: Sitting, Cuff Size: Small)   Pulse (!) 58   Temp 98.3 F (36.8 C) (Oral)   Resp 14   Ht 5\' 1"  (1.549 m)   Wt 199 lb (90.3 kg) Comment: Per Pt  SpO2 97%   BMI 37.60 kg/m   general:   Well developed, well nourished, sitting on a wheelchair   HEENT:  Normocephalic . Face symmetric, atraumatic Lungs:  CTA B Normal respiratory effort, no intercostal retractions, no accessory muscle use. Heart: RRR,  noticeable systolic murmur.  Lower extremities: Several areas are wrapped. Left arm: Skin with no redness, warmness or tenderness. The arm is not swollen. Neurologic:  alert & oriented X3.  Speech normal, gait not tested Psych--  Cognition and judgment appear  intact.  Cooperative with normal attention span and concentration.  Behavior appropriate. No anxious or depressed appearing.       Assessment & Plan:    Assessment   (new pt, transfer from [facility],02-2015) Hypertension Hyperlipidemia Ao stenosis, severe: Dr Burt Knack MSK: --Seronegative Rheumatoid arthritis-- Dr Lenna Gilford  --DJD-- DR Tamera Punt , dr Ronnie [facility] --low back  pain d/t DJD, Dr Sherlyn Lick  Schizophrenia, bipolar, depression: Follow-up at the [place] in [facility] GU: Bladder cancer-transitional cell, Dr. Shona Needles GI:   GERD, history of colitis, history of gastritis,  colon polyps- had a virtual cscope 2016, + polyps, declined further eval Pulmomany: Dr Ashok Cordia --Sleep apnea-- Cpap intolerant --CT nodules f/u 2 pulmonary L LEG DVT 02-2016 Varicose veins: s/p remote surgery, has LE wounds  H[facility]  [facility], [facility], Ortho, pain mngmt   PLAN  Left arm cellulitis: Prior to the infection he noted a couple of openings in the skin, he does not know if he had an insect bite or not. The skin break is likely the etiology of cellulitis. he finished Keflex, clinically doing well. Check a BMP and CBC as recommended by the hospitalist. Gross hematuria: Sxs started last week, already saw urology, UCX +, currently on Levaquin. He also had a yeast infection per patient, treated with clotrimazole are by urology. Hypoxia: During the hospital stay, he was noted to have hypoxia with bedrest. Patient concerned about it. History of OSA, CPAP intolerant. Will order a overnight oximetry, Rx O2 if needed. RTC 03-2017

## 2017-01-22 NOTE — Assessment & Plan Note (Signed)
Left arm cellulitis: Prior to the infection he noted a couple of openings in the skin, he does not know if he had an insect bite or not. The skin break is likely the etiology of cellulitis. he finished Keflex, clinically doing well. Check a BMP and CBC as recommended by the hospitalist. Gross hematuria: Sxs started last week, already saw urology, UCX +, currently on Levaquin. He also had a yeast infection per patient, treated with clotrimazole are by urology. Hypoxia: During the hospital stay, he was noted to have hypoxia with bedrest. Patient concerned about it. History of OSA, CPAP intolerant. Will order a overnight oximetry, Rx O2 if needed. RTC 03-2017

## 2017-01-23 DIAGNOSIS — G629 Polyneuropathy, unspecified: Secondary | ICD-10-CM | POA: Diagnosis not present

## 2017-01-23 DIAGNOSIS — Z7901 Long term (current) use of anticoagulants: Secondary | ICD-10-CM | POA: Diagnosis not present

## 2017-01-23 DIAGNOSIS — L97212 Non-pressure chronic ulcer of right calf with fat layer exposed: Secondary | ICD-10-CM | POA: Diagnosis not present

## 2017-01-23 DIAGNOSIS — D090 Carcinoma in situ of bladder: Secondary | ICD-10-CM | POA: Diagnosis not present

## 2017-01-23 DIAGNOSIS — M1990 Unspecified osteoarthritis, unspecified site: Secondary | ICD-10-CM | POA: Diagnosis not present

## 2017-01-23 DIAGNOSIS — I872 Venous insufficiency (chronic) (peripheral): Secondary | ICD-10-CM | POA: Diagnosis not present

## 2017-01-23 DIAGNOSIS — I890 Lymphedema, not elsewhere classified: Secondary | ICD-10-CM | POA: Diagnosis not present

## 2017-01-23 DIAGNOSIS — Z7982 Long term (current) use of aspirin: Secondary | ICD-10-CM | POA: Diagnosis not present

## 2017-01-23 DIAGNOSIS — Z87891 Personal history of nicotine dependence: Secondary | ICD-10-CM | POA: Diagnosis not present

## 2017-01-24 NOTE — Telephone Encounter (Signed)
Orders re-faxed and order form sent for scanning.

## 2017-01-29 DIAGNOSIS — M79672 Pain in left foot: Secondary | ICD-10-CM | POA: Diagnosis not present

## 2017-01-29 DIAGNOSIS — B351 Tinea unguium: Secondary | ICD-10-CM | POA: Diagnosis not present

## 2017-01-29 DIAGNOSIS — M79671 Pain in right foot: Secondary | ICD-10-CM | POA: Diagnosis not present

## 2017-01-30 DIAGNOSIS — Z7901 Long term (current) use of anticoagulants: Secondary | ICD-10-CM | POA: Diagnosis not present

## 2017-01-30 DIAGNOSIS — Z7982 Long term (current) use of aspirin: Secondary | ICD-10-CM | POA: Diagnosis not present

## 2017-01-30 DIAGNOSIS — Z87891 Personal history of nicotine dependence: Secondary | ICD-10-CM | POA: Diagnosis not present

## 2017-01-30 DIAGNOSIS — G603 Idiopathic progressive neuropathy: Secondary | ICD-10-CM | POA: Diagnosis not present

## 2017-01-30 DIAGNOSIS — L97212 Non-pressure chronic ulcer of right calf with fat layer exposed: Secondary | ICD-10-CM | POA: Diagnosis not present

## 2017-01-30 DIAGNOSIS — I872 Venous insufficiency (chronic) (peripheral): Secondary | ICD-10-CM | POA: Diagnosis not present

## 2017-01-30 DIAGNOSIS — R413 Other amnesia: Secondary | ICD-10-CM | POA: Diagnosis not present

## 2017-01-30 DIAGNOSIS — L03115 Cellulitis of right lower limb: Secondary | ICD-10-CM | POA: Diagnosis not present

## 2017-01-30 DIAGNOSIS — M79671 Pain in right foot: Secondary | ICD-10-CM | POA: Diagnosis not present

## 2017-01-30 DIAGNOSIS — I890 Lymphedema, not elsewhere classified: Secondary | ICD-10-CM | POA: Diagnosis not present

## 2017-01-30 DIAGNOSIS — M1990 Unspecified osteoarthritis, unspecified site: Secondary | ICD-10-CM | POA: Diagnosis not present

## 2017-01-31 DIAGNOSIS — R0902 Hypoxemia: Secondary | ICD-10-CM | POA: Diagnosis not present

## 2017-02-03 ENCOUNTER — Other Ambulatory Visit (HOSPITAL_BASED_OUTPATIENT_CLINIC_OR_DEPARTMENT_OTHER): Payer: Medicare Other

## 2017-02-03 ENCOUNTER — Ambulatory Visit (HOSPITAL_BASED_OUTPATIENT_CLINIC_OR_DEPARTMENT_OTHER): Payer: Medicare Other | Admitting: Hematology & Oncology

## 2017-02-03 VITALS — BP 109/65 | HR 59 | Temp 98.1°F | Resp 17

## 2017-02-03 DIAGNOSIS — I82412 Acute embolism and thrombosis of left femoral vein: Secondary | ICD-10-CM | POA: Diagnosis not present

## 2017-02-03 DIAGNOSIS — I350 Nonrheumatic aortic (valve) stenosis: Secondary | ICD-10-CM

## 2017-02-03 DIAGNOSIS — R609 Edema, unspecified: Secondary | ICD-10-CM

## 2017-02-03 DIAGNOSIS — I82512 Chronic embolism and thrombosis of left femoral vein: Secondary | ICD-10-CM | POA: Diagnosis not present

## 2017-02-03 DIAGNOSIS — B3749 Other urogenital candidiasis: Secondary | ICD-10-CM

## 2017-02-03 DIAGNOSIS — I82722 Chronic embolism and thrombosis of deep veins of left upper extremity: Secondary | ICD-10-CM

## 2017-02-03 LAB — CBC WITH DIFFERENTIAL (CANCER CENTER ONLY)
BASO#: 0 10*3/uL (ref 0.0–0.2)
BASO%: 0.2 % (ref 0.0–2.0)
EOS%: 1.1 % (ref 0.0–7.0)
Eosinophils Absolute: 0.1 10*3/uL (ref 0.0–0.5)
HEMATOCRIT: 39.5 % (ref 38.7–49.9)
HGB: 13.2 g/dL (ref 13.0–17.1)
LYMPH#: 1.5 10*3/uL (ref 0.9–3.3)
LYMPH%: 24.5 % (ref 14.0–48.0)
MCH: 31.6 pg (ref 28.0–33.4)
MCHC: 33.4 g/dL (ref 32.0–35.9)
MCV: 95 fL (ref 82–98)
MONO#: 0.7 10*3/uL (ref 0.1–0.9)
MONO%: 10.8 % (ref 0.0–13.0)
NEUT#: 3.9 10*3/uL (ref 1.5–6.5)
NEUT%: 63.4 % (ref 40.0–80.0)
PLATELETS: 142 10*3/uL — AB (ref 145–400)
RBC: 4.18 10*6/uL — ABNORMAL LOW (ref 4.20–5.70)
RDW: 14.7 % (ref 11.1–15.7)
WBC: 6.1 10*3/uL (ref 4.0–10.0)

## 2017-02-03 LAB — CMP (CANCER CENTER ONLY)
ALK PHOS: 79 U/L (ref 26–84)
ALT: 16 U/L (ref 10–47)
AST: 24 U/L (ref 11–38)
Albumin: 2.9 g/dL — ABNORMAL LOW (ref 3.3–5.5)
BILIRUBIN TOTAL: 0.8 mg/dL (ref 0.20–1.60)
BUN, Bld: 20 mg/dL (ref 7–22)
CO2: 30 meq/L (ref 18–33)
Calcium: 8.7 mg/dL (ref 8.0–10.3)
Chloride: 103 mEq/L (ref 98–108)
Creat: 0.7 mg/dl (ref 0.6–1.2)
GLUCOSE: 99 mg/dL (ref 73–118)
Potassium: 4.1 mEq/L (ref 3.3–4.7)
SODIUM: 142 meq/L (ref 128–145)
Total Protein: 7.1 g/dL (ref 6.4–8.1)

## 2017-02-03 LAB — TECHNOLOGIST REVIEW CHCC SATELLITE

## 2017-02-03 NOTE — Progress Notes (Signed)
Hematology and Oncology Follow Up Visit  Shaun Yoder [id number] [birth year] 71 y.o. 02/03/2017   Principle Diagnosis:   DVT of the left femoral vein  Lupus anticoagulant positive  Current Therapy:    Xarelto 20 mg by mouth daily-lifelong  EC ASA 81 mg po q day - start 12/23/2016     Interim History:  Shaun Yoder is back for follow-up. He comes in a wheelchair. Shaun legs are wrapped. He has really bad peripheral edema.  He has horrible arthritis.  He recently had a bout of cellulitis in Shaun left forearm. It sounds like he may have gotten bit by an insect. He went on a weekend trip that he got back there was a lot of swelling. He was hospitalized for a couple days. He is given IV antibiotics. He was then put on oral antibiotics.  Shaun appetite is doing okay. He does not have any dentures. The [employer] [facility] trying to help him with this.  [facility]. There is shotty help provide her with some care so that she can toe which she needs to do. It has been hard for her since he is pretty much full care.  He's had no obvious bleeding. He is on aspirin and Xarelto. He does have the lupus anticoagulant.   Overall, Shaun performance status is ECOG 3.  Medications:  Current Outpatient Prescriptions:  .  ciprofloxacin (CIPRO) 500 MG tablet, Take 500 mg by mouth., Disp: , Rfl:  .  Abatacept 125 MG/ML SOSY, Inject into the skin once a week. Reported on 10/24/2015, Disp: , Rfl:  .  acetaminophen-codeine (TYLENOL #3) 300-30 MG tablet, Take 1 tablet by mouth every 4 (four) hours as needed for moderate pain., Disp: , Rfl:  .  ARIPiprazole (ABILIFY) 30 MG tablet, Take 30 mg by mouth at bedtime.  , Disp: , Rfl:  .  aspirin EC 81 MG tablet, Take 81 mg by mouth daily., Disp: , Rfl:  .  calcium-vitamin D (CALCIUM 500/D) 500-200 MG-UNIT tablet, Take by mouth., Disp: , Rfl:  .  divalproex (DEPAKOTE ER) 500 MG 24 hr tablet, 4 tabs at bedtime, Disp: , Rfl:  .  folic acid  (FOLVITE) 1 MG tablet, Take 1 mg by mouth daily.  , Disp: , Rfl:  .  gabapentin (NEURONTIN) 300 MG capsule, Take 300 mg by mouth 2 (two) times daily. , Disp: , Rfl:  .  hydrocortisone cream 1 %, Apply on affected areas on face and behind ears twice daily for 14 days., Disp: 30 g, Rfl: 1 .  hydrocortisone valerate cream (WESTCORT) 0.2 %, Apply 1 application topically 2 (two) times daily., Disp: , Rfl:  .  ketoconazole (NIZORAL) 2 % shampoo, Lather and massage into scalp 2-3 times per week. Leave for 5 minutes before rinsing., Disp: 120 mL, Rfl: 0 .  levofloxacin (LEVAQUIN) 500 MG tablet, Take 1 tablet by mouth daily., Disp: , Rfl:  .  methotrexate (RHEUMATREX) 2.5 MG tablet, Take 17.5 mg by mouth once a week. Reported on 12/06/2015, Disp: , Rfl:  .  metoprolol succinate (TOPROL-XL) 25 MG 24 hr tablet, Take 12.5 mg by mouth daily., Disp: , Rfl:  .  metoprolol tartrate (LOPRESSOR) 25 MG tablet, , Disp: , Rfl:  .  mirabegron ER (MYRBETRIQ) 50 MG TB24 tablet, Take 50 mg by mouth daily., Disp: , Rfl:  .  Omega-3 1000 MG CAPS, Take by mouth., Disp: , Rfl:  .  omeprazole (PRILOSEC) 20 MG  capsule, Take 20 mg by mouth every morning. , Disp: , Rfl:  .  oxybutynin (DITROPAN) 5 MG tablet, Take 5 mg by mouth 2 (two) times daily., Disp: , Rfl:  .  trimethoprim-polymyxin b (POLYTRIM) ophthalmic solution, Place 1 drop into both eyes every 4 (four) hours., Disp: 10 mL, Rfl: 0 .  vitamin C (ASCORBIC ACID) 500 MG tablet, Take 500 mg by mouth daily., Disp: , Rfl:  .  XARELTO 20 MG TABS tablet, TAKE 1 TABLET (20 MG TOTAL) BY MOUTH DAILY WITH SUPPER., Disp: 90 tablet, Rfl: 0 .  ziprasidone (GEODON) 60 MG capsule, Take 60 mg by mouth daily. , Disp: , Rfl:   Allergies:  Allergies  Allergen Reactions  . Leflunomide Other (See Comments)    diarrhea  . Morphine And Related Nausea And Vomiting  . Plaquenil [Hydroxychloroquine Sulfate]     rash    Past Medical History, Surgical history, Social history, and Family  History were reviewed and updated.  Review of Systems: As above  Physical Exam:  oral temperature is 98.1 F (36.7 C). Shaun blood pressure is 109/65 and Shaun pulse is 59 (abnormal). Shaun respiration is 17 and oxygen saturation is 97%.   Wt Readings from Last 3 Encounters:  01/21/17 199 lb (90.3 kg)  01/01/17 214 lb (97.1 kg)  10/01/16 214 lb (97.1 kg)     Head and neck exam shows no ocular or oral lesions. There are no palpable cervical or supraclavicular lymph nodes. Shaun lungs are with some slight decrease at the bases. Cardiac exam regular in rhythm. He has a 4/6 systolic ejection murmur consistent with Shaun aortic stenosis. Abdomen is soft. He is moderately obese. There is no fluid wave. There is no palpable liver or spleen tip. Extremities shows wrappings around both legs. He has obvious venous stasis dermatitis changes in both legs below the knees. He has arthritic deformities of Shaun hands. Skin exam shows the stasis dermatitis changes in Shaun lower legs. I see no ecchymoses. He has no petechia. Neurological exam shows no obvious neurological deficits.  Lab Results  Component Value Date   WBC 6.1 02/03/2017   HGB 13.2 02/03/2017   HCT 39.5 02/03/2017   MCV 95 02/03/2017   PLT 142 (L) 02/03/2017     Chemistry      Component Value Date/Time   NA 141 01/21/2017 1453   NA 139 12/23/2016 1516   K 4.2 01/21/2017 1453   K 4.0 12/23/2016 1516   CL 103 01/21/2017 1453   CL 103 12/23/2016 1516   CO2 28 01/21/2017 1453   CO2 30 12/23/2016 1516   BUN 18 01/21/2017 1453   BUN 18 12/23/2016 1516   CREATININE 0.59 (L) 01/21/2017 1453   GLU 72 11/10/2012      Component Value Date/Time   CALCIUM 8.3 (L) 01/21/2017 1453   CALCIUM 8.8 12/23/2016 1516   ALKPHOS 74 01/05/2017 2318   ALKPHOS 75 12/23/2016 1516   AST 26 01/05/2017 2318   AST 28 12/23/2016 1516   ALT 12 (L) 01/05/2017 2318   ALT 21 12/23/2016 1516   BILITOT 1.0 01/05/2017 2318   BILITOT 0.80 12/23/2016 1516          Impression and Plan: Shaun Yoder is a 71 year old white male. He has multiple, multiple health issues. He has a history of thromboembolic disease. Again, I don't think this is really going to be a big issue for him.  Again, he has a lot of non-hematologic or oncologic health issues.  I think these will always be more of a problem for him.  We can probably get him back to see [place] in another 3 months. I really do not see that we had to do anything else for him right now.  I do feel bad for him. Shaun quality of life just is not that great anymore.   [name], MD 7/16/20183:24 PM

## 2017-02-05 ENCOUNTER — Telehealth: Payer: Self-pay | Admitting: Internal Medicine

## 2017-02-05 LAB — CARDIOLIPIN ANTIBODIES, IGG, IGM, IGA
Anticardiolipin Ab,IgA,Qn: <9 APL U/mL (ref 0–11)
Anticardiolipin Ab,IgG,Qn: <9 GPL U/mL (ref 0–14)

## 2017-02-05 NOTE — Telephone Encounter (Signed)
Please call the patient 02/07/2017 and check on him. See below. Need to see how he's doing. Needs to restart Xarelto after 3 days.  ============= Dr. Hardin Negus, a dentist at the  [facility] system called, the patient has a dental infection, the infection is improving but he is bleeding from the site for several days, he has been unable to stop it, recommend to stop Xarelto. The patient has a history of DVTs, latest ultrasound show no DVT. We'll have to stop Xarelto for 3 days then restart. Will call and check on the patient is a couple of days to be sure he is doing okay.

## 2017-02-06 DIAGNOSIS — Z7901 Long term (current) use of anticoagulants: Secondary | ICD-10-CM | POA: Diagnosis not present

## 2017-02-06 DIAGNOSIS — I890 Lymphedema, not elsewhere classified: Secondary | ICD-10-CM | POA: Diagnosis not present

## 2017-02-06 DIAGNOSIS — I872 Venous insufficiency (chronic) (peripheral): Secondary | ICD-10-CM | POA: Diagnosis not present

## 2017-02-06 DIAGNOSIS — L97212 Non-pressure chronic ulcer of right calf with fat layer exposed: Secondary | ICD-10-CM | POA: Diagnosis not present

## 2017-02-06 DIAGNOSIS — M1990 Unspecified osteoarthritis, unspecified site: Secondary | ICD-10-CM | POA: Diagnosis not present

## 2017-02-06 DIAGNOSIS — G629 Polyneuropathy, unspecified: Secondary | ICD-10-CM | POA: Diagnosis not present

## 2017-02-06 DIAGNOSIS — Z87891 Personal history of nicotine dependence: Secondary | ICD-10-CM | POA: Diagnosis not present

## 2017-02-07 DIAGNOSIS — M1612 Unilateral primary osteoarthritis, left hip: Secondary | ICD-10-CM | POA: Diagnosis not present

## 2017-02-07 DIAGNOSIS — M25552 Pain in left hip: Secondary | ICD-10-CM | POA: Diagnosis not present

## 2017-02-07 LAB — LUPUS ANTICOAGULANT PANEL
Hexagonal Phase Phospholipid: 0 s (ref 0–11)
PTT-LA Mix: 59.4 s — ABNORMAL HIGH (ref 0.0–48.9)
PTT-LA: 67 s — AB (ref 0.0–51.9)
dRVVT Confirm: 1.5 ratio — ABNORMAL HIGH (ref 0.8–1.2)
dRVVT Mix: 82.8 s — ABNORMAL HIGH (ref 0.0–47.0)
dRVVT: 135 s — ABNORMAL HIGH (ref 0.0–47.0)

## 2017-02-07 NOTE — Telephone Encounter (Signed)
LMOM requesting call back

## 2017-02-11 ENCOUNTER — Telehealth: Payer: Self-pay | Admitting: Internal Medicine

## 2017-02-11 DIAGNOSIS — G4734 Idiopathic sleep related nonobstructive alveolar hypoventilation: Secondary | ICD-10-CM

## 2017-02-11 NOTE — Telephone Encounter (Signed)
thx

## 2017-02-11 NOTE — Telephone Encounter (Signed)
FYI

## 2017-02-11 NOTE — Telephone Encounter (Signed)
Overnight O2 orders faxed to [facility] at [phone]. Starting at 2L per PCP. Spoke w/ Janae Bridgeman, informed of results and recommendations. Informed to let us know if not contacted by [facility] in next several days. Arlene verbalized understanding. Orders sent for scanning.

## 2017-02-11 NOTE — Telephone Encounter (Signed)
Tammy with [facility] [phone]  She called in to make provider aware that pt does qualify for oxygen but due to his OSA pt have to have a sleep study on C-Pap to show need for oxygen

## 2017-02-11 NOTE — Telephone Encounter (Signed)
Spoke w/ Janae Bridgeman, Pt's wife, Pt is doing well, he saw the dentist yesterday, they are still concerned about one small place on gums/in mouth but believe it should be healed by end of week. They have restarted Xarelto today, no issues w/ bleeding thus far but will call or go to ED if bleeding does occur.

## 2017-02-11 NOTE — Telephone Encounter (Signed)
He is CPAP intolerant, do I  need to write a letter?[name]

## 2017-02-11 NOTE — Telephone Encounter (Signed)
Pulse oximetry performed 01/30/2017: Less than 88% for more than 255 minutes. Please arrange for nocturnal oxygen. DX hypoxemia

## 2017-02-12 NOTE — Telephone Encounter (Signed)
Spoke w/ Tammy at [facility]- due to hx of OSA, Pt must have CPAP titration study needing for O2, however Pt is intolerant to CPAP and does not wear. Will try contacting [facility].

## 2017-02-13 DIAGNOSIS — L97212 Non-pressure chronic ulcer of right calf with fat layer exposed: Secondary | ICD-10-CM | POA: Diagnosis not present

## 2017-02-13 DIAGNOSIS — I890 Lymphedema, not elsewhere classified: Secondary | ICD-10-CM | POA: Diagnosis not present

## 2017-02-13 DIAGNOSIS — S81801A Unspecified open wound, right lower leg, initial encounter: Secondary | ICD-10-CM | POA: Diagnosis not present

## 2017-02-13 DIAGNOSIS — I872 Venous insufficiency (chronic) (peripheral): Secondary | ICD-10-CM | POA: Diagnosis not present

## 2017-02-14 NOTE — Telephone Encounter (Signed)
Overnight oximetry test results have been sent to scanning offsite, waiting for results to be scanned before order can be sent to [facility].

## 2017-02-14 NOTE — Telephone Encounter (Signed)
Overnight O2 order faxed to [facility] at [phone].

## 2017-02-17 ENCOUNTER — Telehealth: Payer: Self-pay | Admitting: Internal Medicine

## 2017-02-17 NOTE — Telephone Encounter (Signed)
Apparently we won'tmbe able to get him O2 for one or another reason. Please refer to pulmonary and let the patient know.

## 2017-02-17 NOTE — Telephone Encounter (Signed)
Spoke w/ Janae Bridgeman, informed her that we have been trying to get O2 therapy for Pt w/o success, she informed that [facility] actually brought out oxygen concentrator on Friday but called today saying insurance isn't in network. Informed her that we would try to reach out to Dr. Ashok Cordia for help. Arlene verbalized understanding.

## 2017-02-17 NOTE — Telephone Encounter (Signed)
Wynetta Emery, [place] C routed conversation to You 4 minutes ago (1:21 PM)    Wynetta Emery, [place] C 17 minutes ago (1:09 PM)      [facility] called in to make provider aware that they are not in network with Medicare (pt's insurance) so they are unable to see pt.

## 2017-02-17 NOTE — Telephone Encounter (Signed)
Pulmonary referral placed to Dr.Nestor, Pt's pulmonologist.

## 2017-02-17 NOTE — Telephone Encounter (Signed)
FYI

## 2017-02-17 NOTE — Telephone Encounter (Signed)
[facility] called in to make provider aware that they are not in network with Medicare (pt's insurance) so they are unable to see pt.

## 2017-02-18 ENCOUNTER — Telehealth: Payer: Self-pay | Admitting: *Deleted

## 2017-02-18 NOTE — Telephone Encounter (Signed)
Received [2] separate faxes from [facility] stating that "they are unable to provide & bill for oxygen d/t patient's Primary Insurance and Zip code; they are not contracted in patient's service area and services are Non-covered through [place]"; forwarded to provider & provider's CMA//SLS 07/31

## 2017-02-20 DIAGNOSIS — Z7901 Long term (current) use of anticoagulants: Secondary | ICD-10-CM | POA: Diagnosis not present

## 2017-02-20 DIAGNOSIS — I890 Lymphedema, not elsewhere classified: Secondary | ICD-10-CM | POA: Diagnosis not present

## 2017-02-20 DIAGNOSIS — G629 Polyneuropathy, unspecified: Secondary | ICD-10-CM | POA: Diagnosis not present

## 2017-02-20 DIAGNOSIS — L97212 Non-pressure chronic ulcer of right calf with fat layer exposed: Secondary | ICD-10-CM | POA: Diagnosis not present

## 2017-02-20 DIAGNOSIS — I872 Venous insufficiency (chronic) (peripheral): Secondary | ICD-10-CM | POA: Diagnosis not present

## 2017-02-20 DIAGNOSIS — Z8551 Personal history of malignant neoplasm of bladder: Secondary | ICD-10-CM | POA: Diagnosis not present

## 2017-02-20 DIAGNOSIS — Z87891 Personal history of nicotine dependence: Secondary | ICD-10-CM | POA: Diagnosis not present

## 2017-02-20 DIAGNOSIS — Z7982 Long term (current) use of aspirin: Secondary | ICD-10-CM | POA: Diagnosis not present

## 2017-02-20 DIAGNOSIS — M1990 Unspecified osteoarthritis, unspecified site: Secondary | ICD-10-CM | POA: Diagnosis not present

## 2017-02-21 NOTE — Telephone Encounter (Signed)
Can you check referral for pulmonary to Dr. Ashok Cordia- was needing to be seen fairly urgently- placed on 02/17/2017. Doesn't look like anyone has done anything w/ referral.

## 2017-02-21 NOTE — Telephone Encounter (Signed)
With patients dx on referral, patient must see a sleep dr, Dr Ashok Cordia does not treat sleep. 1st available is with Dr Elsworth Soho on 05/13/17 @ 9:15. If requesting sooner appt Dr Larose Kells will need to call and speak to dr.

## 2017-02-21 NOTE — Telephone Encounter (Signed)
Home tel [phone] or [phone] cell phone.  Pt's wife Janae Bridgeman called wanting to know if any arrangement was made with insurance about his O2 system since [place] informed from Home care they will be picking up equipment from 8:40 to 10:40 today and wife states the pt has been feeling a lot better since having (only for night time use) and is needing another equipment that can help him breath. Please advise.

## 2017-02-25 NOTE — Telephone Encounter (Signed)
Please advise; I don't know what else can be done to help Pt at this time w/ O2 orders. Per Medicare guidelines, he has to have CPAP titration study but Pt not able to tolerate CPAP? Shaun Yoder unable to see Pt unable October?

## 2017-02-25 NOTE — Telephone Encounter (Signed)
Patient spouse is calling and requesting to speak to nurse regarding O2 orders, call back [phone]

## 2017-02-26 ENCOUNTER — Encounter: Payer: Self-pay | Admitting: Internal Medicine

## 2017-02-26 NOTE — Telephone Encounter (Signed)
Will message Dr. Ashok Cordia and Dr Gracelyn Nurse the issue see if he can be seen soon

## 2017-02-26 NOTE — Telephone Encounter (Signed)
Received answer from Dr. Elsworth Soho, apparently they also have a lot of trouble with situations like this. They will try to see him sooner. Appreciate his help. Please notify the patient.

## 2017-02-27 DIAGNOSIS — L97212 Non-pressure chronic ulcer of right calf with fat layer exposed: Secondary | ICD-10-CM | POA: Diagnosis not present

## 2017-02-27 DIAGNOSIS — Z7982 Long term (current) use of aspirin: Secondary | ICD-10-CM | POA: Diagnosis not present

## 2017-02-27 DIAGNOSIS — G629 Polyneuropathy, unspecified: Secondary | ICD-10-CM | POA: Diagnosis not present

## 2017-02-27 DIAGNOSIS — Z7901 Long term (current) use of anticoagulants: Secondary | ICD-10-CM | POA: Diagnosis not present

## 2017-02-27 DIAGNOSIS — I872 Venous insufficiency (chronic) (peripheral): Secondary | ICD-10-CM | POA: Diagnosis not present

## 2017-02-27 DIAGNOSIS — Z87891 Personal history of nicotine dependence: Secondary | ICD-10-CM | POA: Diagnosis not present

## 2017-02-27 DIAGNOSIS — I890 Lymphedema, not elsewhere classified: Secondary | ICD-10-CM | POA: Diagnosis not present

## 2017-02-27 DIAGNOSIS — M1990 Unspecified osteoarthritis, unspecified site: Secondary | ICD-10-CM | POA: Diagnosis not present

## 2017-02-27 DIAGNOSIS — Z8551 Personal history of malignant neoplasm of bladder: Secondary | ICD-10-CM | POA: Diagnosis not present

## 2017-02-27 NOTE — Telephone Encounter (Signed)
Received MyChart message from Pt's wife last night, updated her on hold up and informed that they should be receiving call to get in sooner w/ Dr. Elsworth Soho.

## 2017-03-05 DIAGNOSIS — M25521 Pain in right elbow: Secondary | ICD-10-CM | POA: Diagnosis not present

## 2017-03-05 DIAGNOSIS — Z79899 Other long term (current) drug therapy: Secondary | ICD-10-CM | POA: Diagnosis not present

## 2017-03-05 DIAGNOSIS — M0589 Other rheumatoid arthritis with rheumatoid factor of multiple sites: Secondary | ICD-10-CM | POA: Diagnosis not present

## 2017-03-05 DIAGNOSIS — M2550 Pain in unspecified joint: Secondary | ICD-10-CM | POA: Diagnosis not present

## 2017-03-05 DIAGNOSIS — Z6841 Body Mass Index (BMI) 40.0 and over, adult: Secondary | ICD-10-CM | POA: Diagnosis not present

## 2017-03-05 DIAGNOSIS — M150 Primary generalized (osteo)arthritis: Secondary | ICD-10-CM | POA: Diagnosis not present

## 2017-03-10 ENCOUNTER — Telehealth: Payer: Self-pay | Admitting: *Deleted

## 2017-03-10 NOTE — Telephone Encounter (Signed)
Received request for Medical Records from [facility], forwarded to [place] for email/scana/SLS 08/20

## 2017-03-13 ENCOUNTER — Telehealth: Payer: Self-pay | Admitting: Internal Medicine

## 2017-03-13 NOTE — Telephone Encounter (Signed)
Caller name: Javan Gonzaga Relationship to patient: wife Can be reached: [phone]  Reason for call: [facility] to Dr. Larose Kells regarding pts health. [facility] sent letter to them that Dr. Larose Kells has not returned paperwork. Pt wife requesting a call back to discuss please.

## 2017-03-13 NOTE — Telephone Encounter (Signed)
Informed Shaun Yoder that we received request medical records request from [place] on 03/10/2017 and record requests go offsite for processing and release. Number to medical records given. Shaun Yoder verbalized understanding and stated she would check status later next week.

## 2017-03-18 ENCOUNTER — Telehealth: Payer: Self-pay | Admitting: Internal Medicine

## 2017-03-18 MED ORDER — RIVAROXABAN 20 MG PO TABS: 20.0000 mg | ORAL_TABLET | Freq: Every day | ORAL | 1 refills | Status: DC

## 2017-03-18 NOTE — Telephone Encounter (Signed)
[phone] Janae Bridgeman    Pt's spouse called in to speak with assistant. She said that pt would like to have a particular medication sent to the [place] because he's a veteran. She said that she would like to have any forms that's needed to take to them

## 2017-03-18 NOTE — Telephone Encounter (Signed)
Spoke w/ Arlene- needing OV note and Rx showing why Pt taking Xarelto- wanting switched to [place] ---> will place at front desk for pick up at their convenience.

## 2017-03-20 DIAGNOSIS — M2550 Pain in unspecified joint: Secondary | ICD-10-CM | POA: Diagnosis not present

## 2017-03-20 DIAGNOSIS — M0589 Other rheumatoid arthritis with rheumatoid factor of multiple sites: Secondary | ICD-10-CM | POA: Diagnosis not present

## 2017-03-20 DIAGNOSIS — Z79899 Other long term (current) drug therapy: Secondary | ICD-10-CM | POA: Diagnosis not present

## 2017-03-20 DIAGNOSIS — M150 Primary generalized (osteo)arthritis: Secondary | ICD-10-CM | POA: Diagnosis not present

## 2017-03-21 ENCOUNTER — Encounter: Payer: Self-pay | Admitting: Internal Medicine

## 2017-03-25 ENCOUNTER — Telehealth: Payer: Self-pay | Admitting: Internal Medicine

## 2017-03-25 MED ORDER — APIXABAN 5 MG PO TABS: 5.0000 mg | ORAL_TABLET | Freq: Two times a day (BID) | ORAL | 1 refills | Status: DC

## 2017-03-25 NOTE — Telephone Encounter (Signed)
Xarelto d/c, Eliquis 5mg  tablets Rx printed, awaiting MD signature. MyChart message sent to Pt and his wife Arlene.

## 2017-03-25 NOTE — Telephone Encounter (Signed)
Note from the [place], they are able to cover ELIQUIS but no Xarelto. If the patient is okay with the change, then send a prescription for ELIQUIS 5 mg one by mouth twice a day #180, 1 refill.

## 2017-03-25 NOTE — Telephone Encounter (Signed)
Eliquis Rx faxed to [facility]- [phone].

## 2017-03-27 LAB — CBC AND DIFFERENTIAL
HCT: 37 — AB (ref 41–53)
Hemoglobin: 12.3 — AB (ref 13.5–17.5)
Neutrophils Absolute: 2
Platelets: 113 — AB (ref 150–399)
WBC: 5

## 2017-03-27 LAB — HEPATIC FUNCTION PANEL
ALT: 14 (ref 10–40)
AST: 19 (ref 14–40)
Alkaline Phosphatase: 87 (ref 25–125)
Bilirubin, Direct: 0.3 (ref 0.01–0.4)
Bilirubin, Total: 0.6

## 2017-03-27 LAB — TSH: TSH: 1 (ref 0.41–5.90)

## 2017-03-27 LAB — LIPID PANEL
Cholesterol: 122 (ref 0–200)
HDL: 30 — AB (ref 35–70)
LDL Cholesterol: 77
Triglycerides: 76 (ref 40–160)

## 2017-03-27 LAB — BASIC METABOLIC PANEL
BUN: 18 (ref 4–21)
Creatinine: 0.5 — AB (ref 0.6–1.3)
GLUCOSE: 80
POTASSIUM: 4 (ref 3.4–5.3)
SODIUM: 140 (ref 137–147)

## 2017-03-27 LAB — PSA: PSA: 0.409

## 2017-03-27 LAB — HEMOGLOBIN A1C: Hemoglobin A1C: 5.1

## 2017-04-02 ENCOUNTER — Ambulatory Visit (INDEPENDENT_AMBULATORY_CARE_PROVIDER_SITE_OTHER): Payer: Medicare Other | Admitting: Internal Medicine

## 2017-04-02 ENCOUNTER — Encounter: Payer: Self-pay | Admitting: Internal Medicine

## 2017-04-02 VITALS — BP 132/78 | HR 58 | Temp 98.2°F | Resp 14 | Ht 61.0 in | Wt 200.0 lb

## 2017-04-02 DIAGNOSIS — I10 Essential (primary) hypertension: Secondary | ICD-10-CM

## 2017-04-02 DIAGNOSIS — G4734 Idiopathic sleep related nonobstructive alveolar hypoventilation: Secondary | ICD-10-CM

## 2017-04-02 MED ORDER — KETOCONAZOLE 2 % EX CREA: 1.0000 "application " | TOPICAL_CREAM | Freq: Every day | CUTANEOUS | 1 refills | Status: DC

## 2017-04-02 NOTE — Progress Notes (Signed)
Subjective:    Patient ID: Shaun Yoder, male    DOB: 01-Apr-1946, 71 y.o.   MRN: [id number]  DOS:  04/02/2017 Type of visit - description : Routine visit Interval history: No major concerns, he seems to be at baseline   Review of Systems Had a UTI, finished antibiotics, currently with no fever, chills or gross hematuria Hip pain: Temporarily better after local injections Diagnosed with seborrheic dermatitis By Dr. Nani Ravens 01-01-17, prescribed Nizoral shampoo and hydrocortisone, not better. Areas are not particularly itchy or painful  Past Medical History:  Diagnosis Date  . Anemia   . Cancer ([place])    bladder  . Colitis   . Colon polyps   . Depression   . Diverticulosis   . DVT of deep femoral vein ([place])   . Gastritis   . GERD (gastroesophageal reflux disease)   . History of rectal polyps   . Hyperlipidemia   . Hypertension   . Iron deficiency   . Low back pain   . OA (osteoarthritis)   . Osteonecrosis of shoulder region ([place])   . Schizoaffective disorder, bipolar type ([place])   . Sleep apnea   . Varicose vein     Past Surgical History:  Procedure Laterality Date  . BLADDER SURGERY    . TOTAL KNEE ARTHROPLASTY Bilateral   . VARICOSE VEIN SURGERY      Social History   Social History  . Marital status: Married    Spouse name: N/A  . Number of children: 2  . Years of education: N/A   Occupational History  . retired    Social History Main Topics  . Smoking status: Former Smoker    Packs/day: 2.50    Years: 43.00    Types: Cigarettes    Quit date: 07/22/1998  . Smokeless tobacco: Never Used  . Alcohol use 0.0 oz/week     Comment: Occasional glass of wine  . Drug use: No  . Sexual activity: Not on file   Other Topics Concern  . Not on file   Social History Narrative   Originally from [place]. Moved to [facility] in July 22, 1985. 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 No known asbestos exposure.  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 No international travel. Has a dog currently. Remote exposure to Cockatiel in a different home. No mold exposure. No hot tub exposure.       Two adopted children      Allergies as of 04/02/2017      Reactions   Leflunomide Other (See Comments)   diarrhea   Morphine And Related Nausea And Vomiting   Plaquenil [hydroxychloroquine Sulfate]    rash      Medication List       Accurate as of 04/02/17 11:59 PM. Always use your most recent med list.          acetaminophen-codeine 300-30 MG tablet Commonly known as:  TYLENOL #3 Take 1 tablet by mouth every 4 (four) hours as needed for moderate pain.   apixaban 5 MG Tabs tablet Commonly known as:  ELIQUIS Take 1 tablet (5 mg total) by mouth 2 (two) times daily.   ARIPiprazole 30 MG tablet Commonly known as:  ABILIFY Take 30 mg by mouth at bedtime.   aspirin EC 81 MG tablet Take 81 mg by mouth daily.  CALCIUM 500/D 500-200 MG-UNIT tablet Generic drug:  calcium-vitamin D Take by mouth.   divalproex 500 MG 24 hr tablet Commonly known as:  DEPAKOTE ER 4 tabs at bedtime   folic acid 1 MG tablet Commonly known as:  FOLVITE Take 1 mg by mouth daily.   gabapentin 300 MG capsule Commonly known as:  NEURONTIN Take 300 mg by mouth 2 (two) times daily.   hydrocortisone cream 1 % Apply on affected areas on face and behind ears twice daily for 14 days.   ketoconazole 2 % cream Commonly known as:  NIZORAL Apply 1 application topically daily.   methotrexate 2.5 MG tablet Commonly known as:  RHEUMATREX Take 17.5 mg by mouth once a week. Reported on 12/06/2015   metoprolol succinate 25 MG 24 hr tablet Commonly known as:  TOPROL-XL Take 12.5 mg by mouth daily.   metoprolol tartrate 25 MG tablet Commonly known as:  LOPRESSOR   mirabegron ER 50 MG Tb24 tablet Commonly known as:   MYRBETRIQ Take 50 mg by mouth daily.   Omega-3 1000 MG Caps Take by mouth.   omeprazole 20 MG capsule Commonly known as:  PRILOSEC Take 20 mg by mouth every morning.   oxybutynin 5 MG tablet Commonly known as:  DITROPAN Take 5 mg by mouth 2 (two) times daily.   trimethoprim-polymyxin b ophthalmic solution Commonly known as:  POLYTRIM Place 1 drop into both eyes every 4 (four) hours.   vitamin C 500 MG tablet Commonly known as:  ASCORBIC ACID Take 500 mg by mouth daily.   XELJANZ XR 11 MG Tb24 Generic drug:  Tofacitinib Citrate Take 1 tablet by mouth daily.   ziprasidone 60 MG capsule Commonly known as:  GEODON Take 60 mg by mouth daily.            Discharge Care Instructions        Start     Ordered   04/02/17 0000  ketoconazole (NIZORAL) 2 % cream  Daily     04/02/17 1336   04/02/17 0000  CBC and differential    Comments:  This external order was created through the Results Console.    04/02/17 1354   04/02/17 6578  Basic metabolic panel    Comments:  This external order was created through the Results Console.    04/02/17 1354   04/02/17 0000  Lipid panel    Comments:  This external order was created through the Results Console.    04/02/17 1354   04/02/17 0000  Hepatic function panel    Comments:  This external order was created through the Results Console.    04/02/17 1354   04/02/17 0000  Hemoglobin A1c    Comments:  This external order was created through the Results Console.    04/02/17 1354   04/02/17 0000  PSA    Comments:  This external order was created through the Results Console.    04/02/17 1354   04/02/17 0000  TSH    Comments:  This external order was created through the Results Console.    04/02/17 1354         Objective:   Physical Exam BP 132/78 (BP Location: Left Arm, Patient Position: Sitting, Cuff Size: Small)   Pulse (!) 58   Temp 98.2 F (36.8 C) (Oral)   Resp 14   Ht 5\' 1"  (1.549 m)   Wt 200 lb (90.7 kg)    SpO2 90%   BMI 37.79 kg/m  General:   Overweight  appearing, sitting in a wheelchair. NAD.  HEENT:  Normocephalic . Face symmetric, atraumatic Lungs:  Decreased breath sounds Normal respiratory effort, no intercostal retractions, no accessory muscle use. Heart: RRR, significant systolic murmur murmur.  Lower extremities wrapped up Skin: At the right side of the naso- facial fold, right cheek and right neck has small patches of erythema, scaliness , no blisters. Neurologic:  alert & oriented X3.  Speech normal Psych--  Cognition and judgment appear intact.  Cooperative with normal attention span and concentration.  Behavior appropriate. No anxious or depressed appearing.      Assessment & Plan:   Assessment   (new pt, transfer from [facility],02-2015) Hypertension Hyperlipidemia Ao stenosis, severe: Dr Burt Knack MSK: --Seronegative Rheumatoid arthritis-- Dr Lenna Gilford  --DJD-- DR Tamera Punt , dr Ronnie Derby --low back pain d/t DJD, Dr Sherlyn Lick  Schizophrenia, bipolar, depression: Follow-up at the [facility] in [facility] GU: Bladder cancer-transitional cell, Dr. Shona Needles GI:   GERD, history of colitis, history of gastritis,  colon polyps- had a virtual cscope 2016, + polyps, declined further eval Pulmomany: Dr Ashok Cordia --Sleep apnea-- Cpap intolerant --CT nodules f/u 2 pulmonary  --hypoxemia, dx 01-2017, unable to get O2 from medicare, will see pulmonary L LEG DVT 02-2016 Varicose veins: s/p remote surgery, has LE wounds  [facility]  [facility] deficiency [facility], Ortho, pain mngmt   PLAN  Labs from 03/27/2017 [facility]):  Hemoglobin 12.3, platelets 113, Potassium 4.0, creatinine 0.5, AST ALT normal. Albumin 2.7 slightly low. TSH 1.0, iron 41, low. TIBC normal, transferring 219 with normal HTN: Currently on metoprolol, no change MSK: Multiple problems including DJD and R.A. under the care of rheumatology, had left hip pain, it was temporarily better with local injections. Gross  hematuria: Resolved Hypoxia: We have not been able to set him up for O2 treatment d/t h/o untreated OSA, to see pulmonary in few weeks. Mild anemia: Found at the [place], was recommended oral iron. Seborrheic dermatitis: Not responding well to topical steroids and Nizoral shampoo. Change to Nizoral cream.  He is overall stable, sees multiple MDs, on multiple medications. RTC 4-5 months

## 2017-04-02 NOTE — Patient Instructions (Addendum)
  GO TO THE FRONT DESK Schedule your next appointment for a  checkup in 4-5 months  Apply the cream called ketoconazole twice a day for 3 weeks to rash on the face and neck  Also okay to use hydrocortisone as needed

## 2017-04-02 NOTE — Progress Notes (Signed)
Pre visit review using our clinic review tool, if applicable. No additional management support is needed unless otherwise documented below in the visit note. 

## 2017-04-03 NOTE — Assessment & Plan Note (Signed)
Labs from 03/27/2017 [facility]):  Hemoglobin 12.3, platelets 113, Potassium 4.0, creatinine 0.5, AST ALT normal. Albumin 2.7 slightly low. TSH 1.0, iron 41, low. TIBC normal, transferring 219 with normal HTN: Currently on metoprolol, no change MSK: Multiple problems including DJD and R.A. under the care of rheumatology, had left hip pain, it was temporarily better with local injections. Gross hematuria: Resolved Hypoxia: We have not been able to set him up for O2 treatment d/t h/o untreated OSA, to see pulmonary in few weeks. Mild anemia: Found at the [place], was recommended oral iron. Seborrheic dermatitis: Not responding well to topical steroids and Nizoral shampoo. Change to Nizoral cream.  He is overall stable, sees multiple MDs, on multiple medications. RTC 4-5 months

## 2017-04-10 ENCOUNTER — Telehealth: Payer: Self-pay | Admitting: Internal Medicine

## 2017-04-10 NOTE — Telephone Encounter (Addendum)
°  Relation to MO:[id number]  Call back number:[phone] Pharmacy:  Reason for call:  Requesting referral to pain managment due to servere right elbow pain due to rheumatoid arthritis, please advise

## 2017-04-10 NOTE — Telephone Encounter (Signed)
Okay to place referral

## 2017-04-11 NOTE — Telephone Encounter (Signed)
Spouse called to follow up, informed of PCP recommendation, spouse voice understanding

## 2017-04-11 NOTE — Telephone Encounter (Signed)
Recommend to discuss  pain first with his [facility] orthopedic doctors, they do have pain management in-house.

## 2017-04-15 DIAGNOSIS — M25521 Pain in right elbow: Secondary | ICD-10-CM | POA: Diagnosis not present

## 2017-04-15 DIAGNOSIS — M069 Rheumatoid arthritis, unspecified: Secondary | ICD-10-CM | POA: Diagnosis not present

## 2017-05-05 ENCOUNTER — Ambulatory Visit (HOSPITAL_BASED_OUTPATIENT_CLINIC_OR_DEPARTMENT_OTHER): Payer: Medicare Other | Admitting: Hematology & Oncology

## 2017-05-05 ENCOUNTER — Other Ambulatory Visit (HOSPITAL_BASED_OUTPATIENT_CLINIC_OR_DEPARTMENT_OTHER): Payer: Medicare Other

## 2017-05-05 VITALS — BP 115/73 | HR 54 | Temp 98.0°F | Resp 20

## 2017-05-05 DIAGNOSIS — I82722 Chronic embolism and thrombosis of deep veins of left upper extremity: Secondary | ICD-10-CM | POA: Diagnosis not present

## 2017-05-05 DIAGNOSIS — D6862 Lupus anticoagulant syndrome: Secondary | ICD-10-CM

## 2017-05-05 DIAGNOSIS — I82412 Acute embolism and thrombosis of left femoral vein: Secondary | ICD-10-CM | POA: Diagnosis not present

## 2017-05-05 DIAGNOSIS — I350 Nonrheumatic aortic (valve) stenosis: Secondary | ICD-10-CM

## 2017-05-05 DIAGNOSIS — D8682 Multiple cranial nerve palsies in sarcoidosis: Secondary | ICD-10-CM | POA: Diagnosis not present

## 2017-05-05 DIAGNOSIS — R609 Edema, unspecified: Secondary | ICD-10-CM

## 2017-05-05 DIAGNOSIS — M069 Rheumatoid arthritis, unspecified: Secondary | ICD-10-CM

## 2017-05-05 DIAGNOSIS — M05719 Rheumatoid arthritis with rheumatoid factor of unspecified shoulder without organ or systems involvement: Secondary | ICD-10-CM

## 2017-05-05 LAB — CBC WITH DIFFERENTIAL (CANCER CENTER ONLY)
BASO#: 0 10*3/uL (ref 0.0–0.2)
BASO%: 0.3 % (ref 0.0–2.0)
EOS ABS: 0.1 10*3/uL (ref 0.0–0.5)
EOS%: 0.9 % (ref 0.0–7.0)
HCT: 41.6 % (ref 38.7–49.9)
HGB: 13.7 g/dL (ref 13.0–17.1)
LYMPH#: 2.2 10*3/uL (ref 0.9–3.3)
LYMPH%: 38.1 % (ref 14.0–48.0)
MCH: 31.7 pg (ref 28.0–33.4)
MCHC: 32.9 g/dL (ref 32.0–35.9)
MCV: 96 fL (ref 82–98)
MONO#: 0.5 10*3/uL (ref 0.1–0.9)
MONO%: 9.1 % (ref 0.0–13.0)
NEUT#: 3 10*3/uL (ref 1.5–6.5)
NEUT%: 51.6 % (ref 40.0–80.0)
PLATELETS: 105 10*3/uL — AB (ref 145–400)
RBC: 4.32 10*6/uL (ref 4.20–5.70)
RDW: 15.6 % (ref 11.1–15.7)
WBC: 5.7 10*3/uL (ref 4.0–10.0)

## 2017-05-05 LAB — CMP (CANCER CENTER ONLY)
ALT(SGPT): 21 U/L (ref 10–47)
AST: 31 U/L (ref 11–38)
Albumin: 3.2 g/dL — ABNORMAL LOW (ref 3.3–5.5)
Alkaline Phosphatase: 66 U/L (ref 26–84)
BUN: 17 mg/dL (ref 7–22)
CHLORIDE: 104 meq/L (ref 98–108)
CO2: 31 meq/L (ref 18–33)
CREATININE: 0.9 mg/dL (ref 0.6–1.2)
Calcium: 9.3 mg/dL (ref 8.0–10.3)
Glucose, Bld: 95 mg/dL (ref 73–118)
Potassium: 4.4 mEq/L (ref 3.3–4.7)
SODIUM: 145 meq/L (ref 128–145)
Total Bilirubin: 0.7 mg/dl (ref 0.20–1.60)
Total Protein: 6.7 g/dL (ref 6.4–8.1)

## 2017-05-05 NOTE — Progress Notes (Signed)
Hematology and Oncology Follow Up Visit  Shaun Yoder [id number] 11-27-1945 71 y.o. 05/05/2017   Principle Diagnosis:   DVT of the left femoral vein  Lupus anticoagulant positive  Current Therapy:    ELIQUIS 5 mg by mouth twice a day  EC ASA 81 mg po q day - start 12/23/2016     Interim History:  Shaun Yoder is back for follow-up. He comes in a wheelchair. His legs are wrapped. He has really bad peripheral edema.  He's doing okay. He had a relatively decent summer. He does go to the [facility] [place] for some of his medical care.  He does have horrible rheumatoid arthritis. He now is being started on an oral agent to try to help. He thinks this might be working a little bit.  He does get injections into his left hip and pelvis. He thought this might be a factor for him having blood clots. I told him that this was not a factor at all.  He's had no obvious bleeding.  He now is on ELIQUIS. The [facility] put him on ELIQUIS. I'm sure this is because they get a better deal on ELIQUIS then with surrounding toe.   His appetite is doing okay. He's had no nausea or vomiting.   Overall, his performance status is ECOG 3.  Medications:  Current Outpatient Prescriptions:  .  acetaminophen-codeine (TYLENOL #3) 300-30 MG tablet, Take 1 tablet by mouth every 4 (four) hours as needed for moderate pain., Disp: , Rfl:  .  apixaban (ELIQUIS) 5 MG TABS tablet, Take 1 tablet (5 mg total) by mouth 2 (two) times daily. (Patient not taking: Reported on 04/02/2017), Disp: 180 tablet, Rfl: 1 .  ARIPiprazole (ABILIFY) 30 MG tablet, Take 30 mg by mouth at bedtime.  , Disp: , Rfl:  .  aspirin EC 81 MG tablet, Take 81 mg by mouth daily., Disp: , Rfl:  .  calcium-vitamin D (CALCIUM 500/D) 500-200 MG-UNIT tablet, Take by mouth., Disp: , Rfl:  .  divalproex (DEPAKOTE ER) 500 MG 24 hr tablet, 4 tabs at bedtime, Disp: , Rfl:  .  folic acid (FOLVITE) 1 MG tablet, Take 1 mg by mouth daily.  , Disp: , Rfl:  .   gabapentin (NEURONTIN) 300 MG capsule, Take 300 mg by mouth 2 (two) times daily. , Disp: , Rfl:  .  hydrocortisone cream 1 %, Apply on affected areas on face and behind ears twice daily for 14 days., Disp: 30 g, Rfl: 1 .  ketoconazole (NIZORAL) 2 % cream, Apply 1 application topically daily., Disp: 60 g, Rfl: 1 .  methotrexate (RHEUMATREX) 2.5 MG tablet, Take 17.5 mg by mouth once a week. Reported on 12/06/2015, Disp: , Rfl:  .  metoprolol succinate (TOPROL-XL) 25 MG 24 hr tablet, Take 12.5 mg by mouth daily., Disp: , Rfl:  .  metoprolol tartrate (LOPRESSOR) 25 MG tablet, , Disp: , Rfl:  .  mirabegron ER (MYRBETRIQ) 50 MG TB24 tablet, Take 50 mg by mouth daily., Disp: , Rfl:  .  Omega-3 1000 MG CAPS, Take by mouth., Disp: , Rfl:  .  omeprazole (PRILOSEC) 20 MG capsule, Take 20 mg by mouth every morning. , Disp: , Rfl:  .  oxybutynin (DITROPAN) 5 MG tablet, Take 5 mg by mouth 2 (two) times daily., Disp: , Rfl:  .  Tofacitinib Citrate (XELJANZ XR) 11 MG TB24, Take 1 tablet by mouth daily., Disp: , Rfl:  .  trimethoprim-polymyxin b (POLYTRIM) ophthalmic solution, Place 1 drop  into both eyes every 4 (four) hours., Disp: 10 mL, Rfl: 0 .  vitamin C (ASCORBIC ACID) 500 MG tablet, Take 500 mg by mouth daily., Disp: , Rfl:  .  ziprasidone (GEODON) 60 MG capsule, Take 60 mg by mouth daily. , Disp: , Rfl:   Allergies:  Allergies  Allergen Reactions  . Leflunomide Other (See Comments)    diarrhea  . Morphine And Related Nausea And Vomiting  . Plaquenil [Hydroxychloroquine Sulfate]     rash    Past Medical History, Surgical history, Social history, and Family History were reviewed and updated.  Review of Systems: As stated in the interim history  Physical Exam:  oral temperature is 98 F (36.7 C). His blood pressure is 115/73 and his pulse is 54 (abnormal). His respiration is 20 and oxygen saturation is 100%.   Wt Readings from Last 3 Encounters:  04/02/17 200 lb (90.7 kg)  01/21/17 199 lb  (90.3 kg)  01/01/17 214 lb (97.1 kg)     Head and neck exam shows no ocular or oral lesions. There are no palpable cervical or supraclavicular lymph nodes. His lungs are with some slight decrease at the bases. Cardiac exam regular in rhythm. He has a 4/6 systolic ejection murmur consistent with his aortic stenosis. Abdomen is soft. He is moderately obese. There is no fluid wave. There is no palpable liver or spleen tip. Extremities shows wrappings around both legs. He has obvious venous stasis dermatitis changes in both legs below the knees. He has arthritic deformities of his hands. Skin exam shows the stasis dermatitis changes in his lower legs. I see no ecchymoses. He has no petechia. Neurological exam shows no obvious neurological deficits.  Lab Results  Component Value Date   WBC 5.7 05/05/2017   HGB 13.7 05/05/2017   HCT 41.6 05/05/2017   MCV 96 05/05/2017   PLT 105 (L) 05/05/2017     Chemistry      Component Value Date/Time   NA 140 03/27/2017   NA 142 02/03/2017 1449   K 4.0 03/27/2017   K 4.1 02/03/2017 1449   CL 103 02/03/2017 1449   CO2 30 02/03/2017 1449   BUN 18 03/27/2017   BUN 20 02/03/2017 1449   CREATININE 0.5 (A) 03/27/2017   CREATININE 0.7 02/03/2017 1449   GLU 80 03/27/2017      Component Value Date/Time   CALCIUM 8.7 02/03/2017 1449   ALKPHOS 87 03/27/2017   ALKPHOS 79 02/03/2017 1449   AST 19 03/27/2017   AST 24 02/03/2017 1449   ALT 14 03/27/2017   ALT 16 02/03/2017 1449   BILITOT 0.80 02/03/2017 1449         Impression and Plan: Shaun Yoder is a 71 year old white male. He has multiple, multiple health issues. He has a history of thromboembolic disease. Again, I don't think this is really going to be a big issue for him.  I'm not sure as to why his blood count is dropping. I looked at his blood under the microscope. I do not see anything that looked suspicious.  I suppose having the rheumatoid arthritis can certainly be a factor.  We will  have to get him back to see [place] in a month. I want to make sure that this platelet count is not keep dropping. One problem is that if he needed a bone marrow test, this would be incredibly difficult given his body habitus and the fact that he really cannot be that flexible.  I talked to  he and his wife for about 25 minutes. I reviewed the lab work with them.   [name], MD 10/15/20182:26 PM

## 2017-05-07 DIAGNOSIS — B351 Tinea unguium: Secondary | ICD-10-CM | POA: Diagnosis not present

## 2017-05-07 DIAGNOSIS — M79671 Pain in right foot: Secondary | ICD-10-CM | POA: Diagnosis not present

## 2017-05-07 DIAGNOSIS — M79672 Pain in left foot: Secondary | ICD-10-CM | POA: Diagnosis not present

## 2017-05-07 LAB — LUPUS ANTICOAGULANT PANEL
DRVVT: 59.6 s — AB (ref 0.0–47.0)
PTT-LA: 39 s (ref 0.0–51.9)
dRVVT Mix: 43.4 s (ref 0.0–47.0)

## 2017-05-12 ENCOUNTER — Ambulatory Visit (INDEPENDENT_AMBULATORY_CARE_PROVIDER_SITE_OTHER): Payer: Medicare Other | Admitting: Pulmonary Disease

## 2017-05-12 ENCOUNTER — Encounter: Payer: Self-pay | Admitting: Pulmonary Disease

## 2017-05-12 VITALS — BP 120/78 | HR 71 | Ht 61.0 in | Wt 206.0 lb

## 2017-05-12 DIAGNOSIS — G4730 Sleep apnea, unspecified: Secondary | ICD-10-CM

## 2017-05-12 NOTE — Patient Instructions (Signed)
Home sleep study Based on this we will decide need for oxygen versus CPAP titration study

## 2017-05-12 NOTE — Progress Notes (Signed)
Subjective:    Patient ID: Shaun Yoder, male    DOB: 01-Feb-1946, 71 y.o.   MRN: [id number]  HPI  71 year old ex smoker, veteran with a long history of rheumatoid arthritis and pulmonary nodules, has been  seen by my partner Dr. Ashok Cordia last 01/2016 referred for evaluation of OSA and nocturnal hypoxia. He is wheelchair bound due to severe RA, generative disc disease and bilateral knee replacements. He also has poor venous circulation with venous stasis. He has severe aortic stenosis and a 4 cm ascending thoracic aortic aneurysm and has refused surgery. He is maintained on anticoagulation due to DVT in 03/2016. He also has schizoaffective disorder and is maintained on Geodon, Abilify He underwent nocturnal oximetry 01/30/17 which showed an O2 saturation less than 88% for more than 255 minutes. Oxygen was prescribed by PCP but DME refused due to diagnosis of OSA hence the referral. He reports a sleep study more than 15 years ago and was told that he has OSA but could not tolerate CPAP mask due to claustrophobia. He does admit to be a mouth breather. He was hospitalized 12/2016 and oxygen saturation was seen to drop in the 60s. He reports increased shortness of breath on movement and wonders if he needs oxygen.   Epworth sleepiness score is tenderness wife reports loud snoring and non-refreshing sleep and excessive somnolence during the day. Bedtime is between 11 PM and midnight, sleep latency is about 30 minutes, he sleeps on his back with one pillow and a hospital bed with his head elevated about 30. He reports 2-3 nocturnal awakenings or nocturia and is out of bed by 6:30 AM feeling tired with occasional dryness of mouth There is no history suggestive of cataplexy, sleep paralysis or parasomnias Is gained about 20 pounds in the last 2-3 years    Significant tests/ events reviewed  PFT 02/15/16: FVC 1.30 L (42%) FEV1 1.13 L (51%) FEV1/FVC 0.7  positive bronchodilator response   DLCO corrected  68% (hemoglobin 16.0 & unable to perform lung volumes   Past Medical History:  Diagnosis Date  . Anemia   . Cancer ([place])    bladder  . Colitis   . Colon polyps   . Depression   . Diverticulosis   . DVT of deep femoral vein ([place])   . Gastritis   . GERD (gastroesophageal reflux disease)   . History of rectal polyps   . Hyperlipidemia   . Hypertension   . Iron deficiency   . Low back pain   . OA (osteoarthritis)   . Osteonecrosis of shoulder region ([place])   . Schizoaffective disorder, bipolar type ([place])   . Sleep apnea   . Varicose vein     Past Surgical History:  Procedure Laterality Date  . BLADDER SURGERY    . TOTAL KNEE ARTHROPLASTY Bilateral   . VARICOSE VEIN SURGERY      Allergies  Allergen Reactions  . Leflunomide Other (See Comments)    diarrhea  . Morphine And Related Nausea And Vomiting  . Plaquenil [Hydroxychloroquine Sulfate]     rash    Social History   Social History  . Marital status: Married    Spouse name: N/A  . Number of children: 2  . Years of education: N/A   Occupational History  . retired    Social History Main Topics  . Smoking status: Former Smoker    Packs/day: 2.50    Years: 43.00    Types: Cigarettes    Quit date: 07/22/1998  .  Smokeless tobacco: Never Used  . Alcohol use 0.0 oz/week     Comment: Occasional glass of wine  . Drug use: No  . Sexual activity: Not on file   Other Topics Concern  . Not on file   Social History Narrative   Originally from [place]. Moved to [facility] in July 22, 1985. 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 No known asbestos exposure. 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 No international travel. Has a dog currently. Remote exposure to Cockatiel in a different home. No mold exposure. No hot tub exposure.       Two adopted children      Family  History  Problem Relation Age of Onset  . Heart attack Mother   . Heart attack Father   . Stroke Father   . Rheumatologic disease Neg Hx   . Colon cancer Neg Hx   . Lung disease Neg Hx   . Prostate cancer Neg Hx     Review of Systems Positive for shortness of breath with activity and joint stiffness  Constitutional: negative for anorexia, fevers and sweats  Eyes: negative for irritation, redness and visual disturbance  Ears, nose, mouth, throat, and face: negative for earaches, epistaxis, nasal congestion and sore throat  Respiratory: negative for cough,  sputum and wheezing  Cardiovascular: negative for chest pain, orthopnea, palpitations and syncope  Gastrointestinal: negative for abdominal pain, constipation, diarrhea, melena, nausea and vomiting  Genitourinary:negative for dysuria, frequency and hematuria  Hematologic/lymphatic: negative for bleeding, easy bruising and lymphadenopathy  Musculoskeletal:negative for muscle weakness  Neurological: negative for coordination problems, gait problems, headaches and weakness  Endocrine: negative for diabetic symptoms including polydipsia, polyuria and weight loss     Objective:   Physical Exam  Gen. Pleasant, obese, in no distress, normal affect, wheelchair bound ENT - no lesions, no post nasal drip, class 2-airway Neck: No JVD, no thyromegaly, no carotid bruits Lungs: no use of accessory muscles, no dullness to percussion, decreased without rales or rhonchi  Cardiovascular: Rhythm regular, heart sounds  normal, no murmurs or gallops, no peripheral edema Abdomen: soft and non-tender, no hepatosplenomegaly, BS normal. Musculoskeletal: No deformities, no cyanosis or clubbing Neuro:  alert, non focal, no tremors        Assessment & Plan:

## 2017-05-12 NOTE — Assessment & Plan Note (Signed)
Very likely that he has underlying OSA. Difficult to differentiate whether his hypersomnolence is related to medications such as Abilify and Geodon that he is on for schizoaffective disorder versus primary OSA. We'll obtain a home sleep study to clarify he is agreeable to retry CPAP machine, since he was claustrophobic last MRI and we may try naso-oral mask since he does seem to be a mouth breather   Given excessive daytime somnolence, narrow pharyngeal exam, witnessed apneas & loud snoring, obstructive sleep apnea is very likely & an overnight polysomnogram will be scheduled as a home study. The pathophysiology of obstructive sleep apnea , it's cardiovascular consequences & modes of treatment including CPAP were discused with the patient in detail & they evidenced understanding.  He would qualify for oxygen if the saturations are not corrected by CPAP HE does not have significant OSA on home sleep study.

## 2017-05-12 NOTE — Progress Notes (Signed)
   Subjective:    Patient ID: Shaun Yoder, male    DOB: 10/10/1945, 71 y.o.   MRN: [id number]  HPI    Review of Systems  Constitutional: Positive for unexpected weight change. Negative for fever.  HENT: Positive for dental problem. Negative for congestion, ear pain, nosebleeds, postnasal drip, rhinorrhea, sinus pressure, sneezing, sore throat and trouble swallowing.   Eyes: Negative for redness and itching.  Respiratory: Positive for shortness of breath. Negative for cough, chest tightness and wheezing.   Cardiovascular: Positive for leg swelling. Negative for palpitations.  Gastrointestinal: Negative for nausea and vomiting.  Genitourinary: Negative for dysuria.  Musculoskeletal: Positive for joint swelling.  Skin: Negative for rash.  Allergic/Immunologic: Negative.  Negative for environmental allergies, food allergies and immunocompromised state.  Neurological: Negative for headaches.  Hematological: Bruises/bleeds easily.  Psychiatric/Behavioral: Negative for dysphoric mood. The patient is nervous/anxious.        Objective:   Physical Exam        Assessment & Plan:

## 2017-05-13 ENCOUNTER — Institutional Professional Consult (permissible substitution): Payer: Medicare Other | Admitting: Pulmonary Disease

## 2017-05-14 DIAGNOSIS — M1612 Unilateral primary osteoarthritis, left hip: Secondary | ICD-10-CM | POA: Diagnosis not present

## 2017-05-21 DIAGNOSIS — C678 Malignant neoplasm of overlapping sites of bladder: Secondary | ICD-10-CM | POA: Diagnosis not present

## 2017-05-21 DIAGNOSIS — R310 Gross hematuria: Secondary | ICD-10-CM | POA: Diagnosis not present

## 2017-05-26 ENCOUNTER — Ambulatory Visit (INDEPENDENT_AMBULATORY_CARE_PROVIDER_SITE_OTHER): Payer: Medicare Other | Admitting: Internal Medicine

## 2017-05-26 ENCOUNTER — Encounter: Payer: Self-pay | Admitting: Internal Medicine

## 2017-05-26 VITALS — BP 128/78 | HR 69 | Resp 14 | Ht 61.0 in | Wt 203.0 lb

## 2017-05-26 DIAGNOSIS — M79605 Pain in left leg: Secondary | ICD-10-CM

## 2017-05-26 DIAGNOSIS — M0589 Other rheumatoid arthritis with rheumatoid factor of multiple sites: Secondary | ICD-10-CM | POA: Diagnosis not present

## 2017-05-26 DIAGNOSIS — M79604 Pain in right leg: Secondary | ICD-10-CM

## 2017-05-26 DIAGNOSIS — E669 Obesity, unspecified: Secondary | ICD-10-CM | POA: Diagnosis not present

## 2017-05-26 DIAGNOSIS — Z6838 Body mass index (BMI) 38.0-38.9, adult: Secondary | ICD-10-CM | POA: Diagnosis not present

## 2017-05-26 DIAGNOSIS — Z79899 Other long term (current) drug therapy: Secondary | ICD-10-CM | POA: Diagnosis not present

## 2017-05-26 DIAGNOSIS — M150 Primary generalized (osteo)arthritis: Secondary | ICD-10-CM | POA: Diagnosis not present

## 2017-05-26 DIAGNOSIS — G629 Polyneuropathy, unspecified: Secondary | ICD-10-CM

## 2017-05-26 DIAGNOSIS — M2550 Pain in unspecified joint: Secondary | ICD-10-CM | POA: Diagnosis not present

## 2017-05-26 NOTE — Progress Notes (Signed)
Subjective:    Patient ID: Shaun Yoder, male    DOB: 05-01-1946, 71 y.o.   MRN: [id number]  DOS:  05/26/2017 Type of visit - description : acute Interval history: Here with his wife, his main concern is pain at the anterior aspect of both thighs. The patient has chronic pain and is very hard for him to separate what is new or old pain. The best I could understand is as follows: This pain is located at the anterior thighs B, not necessarily worse at night, described as a "deep ache", pain make him move his feet and that seems to help a little the pain (resembles RLS). Has talked with his rheumatologist this morning and was told to discuss with me.  He has chronic hip pains, had 3 injections on the left hip with ; anterior approach, last was 2 weeks ago.  Review of Systems  Denies fever chills No rash at the area of pain Scrotum is not swollen, no bulging mass at the groin - like a hernia- that he can tell.  Past Medical History:  Diagnosis Date  . Anemia   . Cancer ([place])    bladder  . Colitis   . Colon polyps   . Depression   . Diverticulosis   . DVT of deep femoral vein ([place])   . Gastritis   . GERD (gastroesophageal reflux disease)   . History of rectal polyps   . Hyperlipidemia   . Hypertension   . Iron deficiency   . Low back pain   . OA (osteoarthritis)   . Osteonecrosis of shoulder region ([place])   . Schizoaffective disorder, bipolar type ([place])   . Sleep apnea   . Varicose vein     Past Surgical History:  Procedure Laterality Date  . BLADDER SURGERY    . TOTAL KNEE ARTHROPLASTY Bilateral   . VARICOSE VEIN SURGERY      Social History   Socioeconomic History  . Marital status: Married    Spouse name: Not on file  . Number of children: 2  . Years of education: Not on file  . Highest education level: Not on file  Social Needs  . Financial resource strain: Not on file  . Food insecurity - worry: Not on file  . Food insecurity - inability: Not on  file  . Transportation needs - medical: Not on file  . Transportation needs - non-medical: Not on file  Occupational History  . Occupation: retired  Tobacco Use  . Smoking status: Former Smoker    Packs/day: 2.50    Years: 43.00    Pack years: 107.50    Types: Cigarettes    Last attempt to quit: 07/22/1998    Years since quitting: 18.8  . Smokeless tobacco: Never Used  Substance and Sexual Activity  . Alcohol use: Yes    Alcohol/week: 0.0 oz    Comment: Occasional glass of wine  . Drug use: No  . Sexual activity: Not on file  Other Topics Concern  . Not on file  Social History Narrative   Originally from [place]. Moved to [facility] in July 22, 1985. 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 No known asbestos exposure. 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 No international travel. Has a dog currently. Remote exposure to Cockatiel in a different home. No mold exposure. No  hot tub exposure.       Two adopted children      Allergies as of 05/26/2017      Reactions   Leflunomide Other (See Comments)   diarrhea   Morphine And Related Nausea And Vomiting   Plaquenil [hydroxychloroquine Sulfate]    rash      Medication List        Accurate as of 05/26/17 11:59 PM. Always use your most recent med list.          acetaminophen-codeine 300-30 MG tablet Commonly known as:  TYLENOL #3 Take 1 tablet by mouth every 4 (four) hours as needed for moderate pain.   apixaban 5 MG Tabs tablet Commonly known as:  ELIQUIS Take 1 tablet (5 mg total) by mouth 2 (two) times daily.   ARIPiprazole 30 MG tablet Commonly known as:  ABILIFY Take 30 mg by mouth at bedtime.   aspirin EC 81 MG tablet Take 81 mg by mouth daily.   CALCIUM 500/D 500-200 MG-UNIT tablet Generic drug:  calcium-vitamin D Take by mouth.   divalproex 500 MG 24 hr  tablet Commonly known as:  DEPAKOTE ER 4 tabs at bedtime   folic acid 1 MG tablet Commonly known as:  FOLVITE Take 1 mg by mouth daily.   gabapentin 300 MG capsule Commonly known as:  NEURONTIN Take 300 mg by mouth 2 (two) times daily.   hydrocortisone cream 1 % Apply on affected areas on face and behind ears twice daily for 14 days.   ketoconazole 2 % cream Commonly known as:  NIZORAL Apply 1 application topically daily.   methotrexate 2.5 MG tablet Commonly known as:  RHEUMATREX Take 17.5 mg by mouth once a week. Reported on 12/06/2015   metoprolol succinate 25 MG 24 hr tablet Commonly known as:  TOPROL-XL Take 12.5 mg by mouth daily.   metoprolol tartrate 25 MG tablet Commonly known as:  LOPRESSOR   mirabegron ER 50 MG Tb24 tablet Commonly known as:  MYRBETRIQ Take 50 mg by mouth daily.   Omega-3 1000 MG Caps Take by mouth.   omeprazole 20 MG capsule Commonly known as:  PRILOSEC Take 20 mg by mouth every morning.   oxybutynin 5 MG tablet Commonly known as:  DITROPAN Take 5 mg by mouth 2 (two) times daily.   trimethoprim-polymyxin b ophthalmic solution Commonly known as:  POLYTRIM Place 1 drop into both eyes every 4 (four) hours.   vitamin C 500 MG tablet Commonly known as:  ASCORBIC ACID Take 500 mg by mouth daily.   XELJANZ XR 11 MG Tb24 Generic drug:  Tofacitinib Citrate Take 1 tablet by mouth daily.   ziprasidone 60 MG capsule Commonly known as:  GEODON Take 60 mg by mouth daily.          Objective:   Physical Exam BP 128/78 (BP Location: Left Arm, Patient Position: Sitting, Cuff Size: Small)   Pulse 69   Resp 14   Ht 5\' 1"  (1.549 m)   Wt 203 lb (92.1 kg)   SpO2 95%   BMI 38.36 kg/m  General:   Well developed, well nourished.[name]  HEENT:  Normocephalic . Face symmetric, atraumatic Skin: thighs  skin looks normal. Groins: No hernias on exam and good pedal and femoral pulses present. MSK: Mobility is extremely limited, needs assistance  moving, transferring. Neurologic:  alert & oriented X3.  Speech normal, gait extremely limited. Motor symmetric Psych--  Cognition and judgment appear intact.  Cooperative with normal attention span and  concentration.  Behavior appropriate. No anxious or depressed appearing.      Assessment & Plan:   Assessment   (new pt, transfer from [facility],02-2015) Hypertension Hyperlipidemia Ao stenosis, severe: Dr Burt Knack MSK: --Seronegative Rheumatoid arthritis-- Dr Lenna Gilford  --DJD-- DR Tamera Punt , dr Ronnie Derby --low back pain d/t DJD, Dr Sherlyn Lick  Schizophrenia, bipolar, depression: Follow-up at the [facility] in [facility] GU: Bladder cancer-transitional cell, Dr. Shona Needles GI:   GERD, history of colitis, history of gastritis,  colon polyps- had a virtual cscope 2016, + polyps, declined further eval Pulmomany: Dr Ashok Cordia --Sleep apnea-- Cpap intolerant --CT nodules f/u 2 pulmonary  --hypoxemia, dx 01-2017, unable to get O2 from medicare, will see pulmonary L LEG DVT 02-2016 Varicose veins: s/p remote surgery, has LE wounds  H/o  iron deficiency anemia At the [facility]: sees a dentist, PCP, psych, Ortho, pain mngmt   PLAN  Pain, thighs: As described above, pain is on the anterior thighs bilaterally described as a "deep ache" seems better when  he moves his feet resembling RLS.  Pain could also be simply related to DJD, neuropathy; location resembles meralgia paresthetica but are hurts, is not numb. Recommend to discuss with Ortho, trial with capsaicin , check Q24 -folic acid

## 2017-05-26 NOTE — Progress Notes (Signed)
Pre visit review using our clinic review tool, if applicable. No additional management support is needed unless otherwise documented below in the visit note. 

## 2017-05-26 NOTE — Patient Instructions (Signed)
Increase gabapentin 300 mg to 1 tablet 3 times a day  Try over-the-counter cream called  Capsaicin   Please discuss with your orthopedic doctor  Consider see your neurologist again

## 2017-05-27 LAB — VITAMIN B12: Vitamin B-12: 669 pg/mL (ref 211–911)

## 2017-05-27 LAB — FOLATE: FOLATE: 17.8 ng/mL (ref >5.9)

## 2017-05-27 NOTE — Assessment & Plan Note (Signed)
Pain, thighs: As described above, pain is on the anterior thighs bilaterally described as a "deep ache" seems better when  he moves his feet resembling RLS.  Pain could also be simply related to DJD, neuropathy; location resembles meralgia paresthetica but are hurts, is not numb. Recommend to discuss with Ortho, trial with capsaicin , check G83 -folic acid

## 2017-05-28 ENCOUNTER — Telehealth: Payer: Self-pay | Admitting: Internal Medicine

## 2017-05-28 NOTE — Telephone Encounter (Signed)
Spoke w/ Pt, informed of results, he is seeing ortho tomorrow for legs? Just wanted to let PCP know.

## 2017-05-28 NOTE — Telephone Encounter (Signed)
thx

## 2017-05-28 NOTE — Telephone Encounter (Signed)
Patient is calling to follow up on visit Monday states that he has schedule an appt oral surgeon on Friday. Patient would like to speak to nurse regarding results of labs.  [phone] home  [phone] cell

## 2017-05-29 DIAGNOSIS — Z471 Aftercare following joint replacement surgery: Secondary | ICD-10-CM | POA: Diagnosis not present

## 2017-05-29 DIAGNOSIS — Z96652 Presence of left artificial knee joint: Secondary | ICD-10-CM | POA: Diagnosis not present

## 2017-06-07 DIAGNOSIS — M25552 Pain in left hip: Secondary | ICD-10-CM | POA: Diagnosis not present

## 2017-06-09 ENCOUNTER — Other Ambulatory Visit: Payer: Self-pay

## 2017-06-09 ENCOUNTER — Other Ambulatory Visit (HOSPITAL_BASED_OUTPATIENT_CLINIC_OR_DEPARTMENT_OTHER): Payer: Medicare Other

## 2017-06-09 ENCOUNTER — Ambulatory Visit (HOSPITAL_BASED_OUTPATIENT_CLINIC_OR_DEPARTMENT_OTHER): Payer: Medicare Other | Admitting: Hematology & Oncology

## 2017-06-09 VITALS — BP 107/52 | HR 56 | Temp 97.7°F | Resp 18

## 2017-06-09 DIAGNOSIS — M069 Rheumatoid arthritis, unspecified: Secondary | ICD-10-CM

## 2017-06-09 DIAGNOSIS — M05719 Rheumatoid arthritis with rheumatoid factor of unspecified shoulder without organ or systems involvement: Secondary | ICD-10-CM

## 2017-06-09 DIAGNOSIS — R609 Edema, unspecified: Secondary | ICD-10-CM | POA: Diagnosis not present

## 2017-06-09 DIAGNOSIS — I82412 Acute embolism and thrombosis of left femoral vein: Secondary | ICD-10-CM

## 2017-06-09 LAB — CBC WITH DIFFERENTIAL (CANCER CENTER ONLY)
BASO#: 0 10*3/uL (ref 0.0–0.2)
BASO%: 0.4 % (ref 0.0–2.0)
EOS%: 0 % (ref 0.0–7.0)
Eosinophils Absolute: 0 10*3/uL (ref 0.0–0.5)
HCT: 38.3 % — ABNORMAL LOW (ref 38.7–49.9)
HGB: 13.2 g/dL (ref 13.0–17.1)
LYMPH#: 1.1 10*3/uL (ref 0.9–3.3)
LYMPH%: 16.3 % (ref 14.0–48.0)
MCH: 32.6 pg (ref 28.0–33.4)
MCHC: 34.5 g/dL (ref 32.0–35.9)
MCV: 95 fL (ref 82–98)
MONO#: 0.3 10*3/uL (ref 0.1–0.9)
MONO%: 4 % (ref 0.0–13.0)
NEUT#: 5.3 10*3/uL (ref 1.5–6.5)
NEUT%: 79.3 % (ref 40.0–80.0)
PLATELETS: 114 10*3/uL — AB (ref 145–400)
RBC: 4.05 10*6/uL — AB (ref 4.20–5.70)
RDW: 15 % (ref 11.1–15.7)
WBC: 6.7 10*3/uL (ref 4.0–10.0)
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
## 2017-06-10 DIAGNOSIS — G4733 Obstructive sleep apnea (adult) (pediatric): Secondary | ICD-10-CM | POA: Diagnosis not present

## 2017-06-11 DIAGNOSIS — G4733 Obstructive sleep apnea (adult) (pediatric): Secondary | ICD-10-CM | POA: Diagnosis not present

## 2017-06-19 ENCOUNTER — Telehealth: Payer: Self-pay | Admitting: Pulmonary Disease

## 2017-06-19 DIAGNOSIS — G4733 Obstructive sleep apnea (adult) (pediatric): Secondary | ICD-10-CM

## 2017-06-19 NOTE — Telephone Encounter (Signed)
Per RA, HST showed 39 events per hour. Recommends CPAP titration study as the next step.

## 2017-06-20 ENCOUNTER — Other Ambulatory Visit: Payer: Self-pay | Admitting: *Deleted

## 2017-06-20 DIAGNOSIS — G4730 Sleep apnea, unspecified: Secondary | ICD-10-CM

## 2017-06-20 NOTE — Telephone Encounter (Signed)
Called pt and advised message from the provider. Pt understood and verbalized understanding. Nothing further is needed.   Cpap titration ordered.  

## 2017-06-20 NOTE — Telephone Encounter (Signed)
Patient calling for sleep study results he can be reached at [phone] or [phone]

## 2017-06-24 DIAGNOSIS — L97812 Non-pressure chronic ulcer of other part of right lower leg with fat layer exposed: Secondary | ICD-10-CM | POA: Diagnosis not present

## 2017-06-24 DIAGNOSIS — L089 Local infection of the skin and subcutaneous tissue, unspecified: Secondary | ICD-10-CM | POA: Diagnosis not present

## 2017-06-24 DIAGNOSIS — L97822 Non-pressure chronic ulcer of other part of left lower leg with fat layer exposed: Secondary | ICD-10-CM | POA: Diagnosis not present

## 2017-06-24 DIAGNOSIS — L97512 Non-pressure chronic ulcer of other part of right foot with fat layer exposed: Secondary | ICD-10-CM | POA: Diagnosis not present

## 2017-06-24 DIAGNOSIS — L97222 Non-pressure chronic ulcer of left calf with fat layer exposed: Secondary | ICD-10-CM | POA: Diagnosis not present

## 2017-06-24 DIAGNOSIS — I890 Lymphedema, not elsewhere classified: Secondary | ICD-10-CM | POA: Diagnosis not present

## 2017-06-24 DIAGNOSIS — I872 Venous insufficiency (chronic) (peripheral): Secondary | ICD-10-CM | POA: Diagnosis not present

## 2017-06-27 DIAGNOSIS — I890 Lymphedema, not elsewhere classified: Secondary | ICD-10-CM | POA: Diagnosis not present

## 2017-06-27 DIAGNOSIS — L97812 Non-pressure chronic ulcer of other part of right lower leg with fat layer exposed: Secondary | ICD-10-CM | POA: Diagnosis not present

## 2017-06-27 DIAGNOSIS — L97222 Non-pressure chronic ulcer of left calf with fat layer exposed: Secondary | ICD-10-CM | POA: Diagnosis not present

## 2017-06-27 DIAGNOSIS — I872 Venous insufficiency (chronic) (peripheral): Secondary | ICD-10-CM | POA: Diagnosis not present

## 2017-07-02 DIAGNOSIS — L97812 Non-pressure chronic ulcer of other part of right lower leg with fat layer exposed: Secondary | ICD-10-CM | POA: Diagnosis not present

## 2017-07-02 DIAGNOSIS — I890 Lymphedema, not elsewhere classified: Secondary | ICD-10-CM | POA: Diagnosis not present

## 2017-07-02 DIAGNOSIS — L97822 Non-pressure chronic ulcer of other part of left lower leg with fat layer exposed: Secondary | ICD-10-CM | POA: Diagnosis not present

## 2017-07-02 DIAGNOSIS — L97222 Non-pressure chronic ulcer of left calf with fat layer exposed: Secondary | ICD-10-CM | POA: Diagnosis not present

## 2017-07-02 DIAGNOSIS — I872 Venous insufficiency (chronic) (peripheral): Secondary | ICD-10-CM | POA: Diagnosis not present

## 2017-07-02 DIAGNOSIS — L97512 Non-pressure chronic ulcer of other part of right foot with fat layer exposed: Secondary | ICD-10-CM | POA: Diagnosis not present

## 2017-07-04 DIAGNOSIS — L97222 Non-pressure chronic ulcer of left calf with fat layer exposed: Secondary | ICD-10-CM | POA: Diagnosis not present

## 2017-07-04 DIAGNOSIS — L089 Local infection of the skin and subcutaneous tissue, unspecified: Secondary | ICD-10-CM | POA: Diagnosis not present

## 2017-07-06 ENCOUNTER — Other Ambulatory Visit: Payer: Self-pay | Admitting: Family Medicine

## 2017-07-06 DIAGNOSIS — L219 Seborrheic dermatitis, unspecified: Secondary | ICD-10-CM

## 2017-07-07 NOTE — Telephone Encounter (Signed)
Rx sent 

## 2017-07-07 NOTE — Telephone Encounter (Signed)
Pt requesting refill on ketoconazole 2 % shampoo. No longer on med list. Please advise.

## 2017-07-07 NOTE — Telephone Encounter (Signed)
Ok RF X1 

## 2017-07-08 DIAGNOSIS — I872 Venous insufficiency (chronic) (peripheral): Secondary | ICD-10-CM | POA: Diagnosis not present

## 2017-07-08 DIAGNOSIS — L97812 Non-pressure chronic ulcer of other part of right lower leg with fat layer exposed: Secondary | ICD-10-CM | POA: Diagnosis not present

## 2017-07-08 DIAGNOSIS — I890 Lymphedema, not elsewhere classified: Secondary | ICD-10-CM | POA: Diagnosis not present

## 2017-07-08 DIAGNOSIS — L97512 Non-pressure chronic ulcer of other part of right foot with fat layer exposed: Secondary | ICD-10-CM | POA: Diagnosis not present

## 2017-07-08 DIAGNOSIS — L97822 Non-pressure chronic ulcer of other part of left lower leg with fat layer exposed: Secondary | ICD-10-CM | POA: Diagnosis not present

## 2017-07-08 DIAGNOSIS — L97222 Non-pressure chronic ulcer of left calf with fat layer exposed: Secondary | ICD-10-CM | POA: Diagnosis not present

## 2017-07-12 ENCOUNTER — Ambulatory Visit (HOSPITAL_BASED_OUTPATIENT_CLINIC_OR_DEPARTMENT_OTHER): Payer: Medicare Other | Attending: Pulmonary Disease | Admitting: Pulmonary Disease

## 2017-07-12 DIAGNOSIS — G4733 Obstructive sleep apnea (adult) (pediatric): Secondary | ICD-10-CM | POA: Diagnosis not present

## 2017-07-12 DIAGNOSIS — R0683 Snoring: Secondary | ICD-10-CM | POA: Diagnosis not present

## 2017-07-16 DIAGNOSIS — I872 Venous insufficiency (chronic) (peripheral): Secondary | ICD-10-CM | POA: Diagnosis not present

## 2017-07-16 DIAGNOSIS — L97512 Non-pressure chronic ulcer of other part of right foot with fat layer exposed: Secondary | ICD-10-CM | POA: Diagnosis not present

## 2017-07-16 DIAGNOSIS — S8992XD Unspecified injury of left lower leg, subsequent encounter: Secondary | ICD-10-CM | POA: Diagnosis not present

## 2017-07-16 DIAGNOSIS — I890 Lymphedema, not elsewhere classified: Secondary | ICD-10-CM | POA: Diagnosis not present

## 2017-07-16 DIAGNOSIS — L97222 Non-pressure chronic ulcer of left calf with fat layer exposed: Secondary | ICD-10-CM | POA: Diagnosis not present

## 2017-07-23 ENCOUNTER — Telehealth: Payer: Self-pay | Admitting: Pulmonary Disease

## 2017-07-23 NOTE — Telephone Encounter (Signed)
Pt is requesting sleep study results.  RA - please advise. Thanks.

## 2017-07-23 NOTE — Telephone Encounter (Signed)
Pt calling back about sleep study results. Cb is [phone] or cell [phone]

## 2017-07-23 NOTE — Telephone Encounter (Signed)
Spoke with patient regarding sleep study results Advised that RA needs to review results, and will not be back in office until 07/25/2017 Pt verbalized understanding. Nothing further needed.

## 2017-07-24 DIAGNOSIS — L97812 Non-pressure chronic ulcer of other part of right lower leg with fat layer exposed: Secondary | ICD-10-CM | POA: Diagnosis not present

## 2017-07-24 DIAGNOSIS — L97512 Non-pressure chronic ulcer of other part of right foot with fat layer exposed: Secondary | ICD-10-CM | POA: Diagnosis not present

## 2017-07-24 DIAGNOSIS — L97822 Non-pressure chronic ulcer of other part of left lower leg with fat layer exposed: Secondary | ICD-10-CM | POA: Diagnosis not present

## 2017-07-24 DIAGNOSIS — I890 Lymphedema, not elsewhere classified: Secondary | ICD-10-CM | POA: Diagnosis not present

## 2017-07-24 DIAGNOSIS — I872 Venous insufficiency (chronic) (peripheral): Secondary | ICD-10-CM | POA: Diagnosis not present

## 2017-07-24 DIAGNOSIS — L97222 Non-pressure chronic ulcer of left calf with fat layer exposed: Secondary | ICD-10-CM | POA: Diagnosis not present

## 2017-07-24 DIAGNOSIS — I87313 Chronic venous hypertension (idiopathic) with ulcer of bilateral lower extremity: Secondary | ICD-10-CM | POA: Diagnosis not present

## 2017-07-24 DIAGNOSIS — R0600 Dyspnea, unspecified: Secondary | ICD-10-CM | POA: Diagnosis not present

## 2017-07-24 NOTE — Procedures (Signed)
Patient Name: Shaun Yoder, Shaun Yoder Date: 07/12/2017 Gender: Male D.O.B: 03-Nov-1945 Age (years): 15 Referring Provider: Kara Mead MD, ABSM Height (inches): 61 Interpreting Physician: Kara Mead MD, ABSM Weight (lbs): 204 RPSGT: Earney Hamburg BMI: 39 MRN: [id number] Neck Size: 18.00 <br> <br>   CLINICAL INFORMATION The patient is referred for a CPAP titration to treat sleep apnea.    Date of HST: 05/2017 ,showed AHI 39/h  SLEEP STUDY TECHNIQUE As per the AASM Manual for the Scoring of Sleep and Associated Events v2.3 (April 2016) with a hypopnea requiring 4% desaturations.  The channels recorded and monitored were frontal, central and occipital EEG, electrooculogram (EOG), submentalis EMG (chin), nasal and oral airflow, thoracic and abdominal wall motion, anterior tibialis EMG, snore microphone, electrocardiogram, and pulse oximetry. Continuous positive airway pressure (CPAP) was initiated at the beginning of the study and titrated to treat sleep-disordered breathing.  MEDICATIONS Medications self-administered by patient taken the night of the study : MELATONIN, DEPAKOTE, ABILIFY, GEODON  RESPIRATORY PARAMETERS Optimal PAP Pressure (cm): 15 AHI at Optimal Pressure (/hr): 2.6 Overall Minimal O2 (%): 0.00 Supine % at Optimal Pressure (%): 100 Minimal O2 at Optimal Pressure (%): 88.0   SLEEP ARCHITECTURE The study was initiated at 11:27:08 PM and ended at 5:28:06 AM.  Sleep onset time was 10.9 minutes and the sleep efficiency was 88.3%. The total sleep time was 318.6 minutes.  The patient spent 1.26% of the night in stage N1 sleep, 89.96% in stage N2 sleep, 0.00% in stage N3 and 8.79% in REM.Stage REM latency was 71.0 minutes  Wake after sleep onset was 31.5. Alpha intrusion was absent. Supine sleep was 100.00%.  CARDIAC DATA The 2 lead EKG demonstrated sinus rhythm. The mean heart rate was 57.66 beats per minute. Other EKG findings include: None.   LEG MOVEMENT  DATA The total Periodic Limb Movements of Sleep (PLMS) were 1. The PLMS index was 0.19. A PLMS index of <15 is considered normal in adults.  IMPRESSIONS - The optimal PAP pressure was 15 cm of water. - Central sleep apnea was not noted during this titration (CAI = 0.0/h). - Severe oxygen desaturations were observed during this titration (min O2 = 0.00%). - The patient snored with soft snoring volume during this titration study. - No cardiac abnormalities were observed during this study. - Clinically significant periodic limb movements were not noted during this study. Arousals associated with PLMs were rare.   DIAGNOSIS - Obstructive Sleep Apnea (327.23 [G47.33 ICD-10])   RECOMMENDATIONS - Trial of CPAP therapy on 15 cm H2O with a Medium size Philips Respironics Full Face Mask Dreamwear mask and heated humidification. - Avoid alcohol, sedatives and other CNS depressants that may worsen sleep apnea and disrupt normal sleep architecture. - Sleep hygiene should be reviewed to assess factors that may improve sleep quality. - Weight management and regular exercise should be initiated or continued. - [facility] to [facility] for [facility]-evaluation after 4 weeks of therapy   Kara Mead MD Board Certified in [place]

## 2017-07-24 NOTE — Telephone Encounter (Signed)
Left message for patient to call back  

## 2017-07-24 NOTE — Telephone Encounter (Signed)
AutoCPAP therapy 10-15 cm H2O with a Medium size Philips Respironics Full Face Mask Dreamwear mask and heated humidification. DL & OV in 4 wks

## 2017-07-25 ENCOUNTER — Telehealth: Payer: Self-pay | Admitting: Pulmonary Disease

## 2017-07-25 DIAGNOSIS — G4733 Obstructive sleep apnea (adult) (pediatric): Secondary | ICD-10-CM

## 2017-07-25 NOTE — Telephone Encounter (Signed)
Shaun Noel, MD    AutoCPAP therapy 10-15 cm H2O with a Medium size Philips Respironics Full Face Mask Dreamwear mask and heated humidification. DL & OV in 4 wks    --------------------------------------------- Spoke with pt and his wife. They are aware that he will need to start CPAP therapy. Order has been placed. ROV has been scheduled for 09/01/17 at 1:45pm. Nothing further was needed at this time.

## 2017-07-30 ENCOUNTER — Ambulatory Visit: Payer: Medicare Other | Admitting: Internal Medicine

## 2017-07-31 DIAGNOSIS — L97222 Non-pressure chronic ulcer of left calf with fat layer exposed: Secondary | ICD-10-CM | POA: Diagnosis not present

## 2017-07-31 DIAGNOSIS — I890 Lymphedema, not elsewhere classified: Secondary | ICD-10-CM | POA: Diagnosis not present

## 2017-07-31 DIAGNOSIS — L97512 Non-pressure chronic ulcer of other part of right foot with fat layer exposed: Secondary | ICD-10-CM | POA: Diagnosis not present

## 2017-07-31 DIAGNOSIS — L97822 Non-pressure chronic ulcer of other part of left lower leg with fat layer exposed: Secondary | ICD-10-CM | POA: Diagnosis not present

## 2017-07-31 DIAGNOSIS — I872 Venous insufficiency (chronic) (peripheral): Secondary | ICD-10-CM | POA: Diagnosis not present

## 2017-07-31 DIAGNOSIS — L97812 Non-pressure chronic ulcer of other part of right lower leg with fat layer exposed: Secondary | ICD-10-CM | POA: Diagnosis not present

## 2017-08-01 ENCOUNTER — Telehealth: Payer: Self-pay | Admitting: Pulmonary Disease

## 2017-08-01 ENCOUNTER — Ambulatory Visit: Payer: Medicare Other | Admitting: Internal Medicine

## 2017-08-01 DIAGNOSIS — L97512 Non-pressure chronic ulcer of other part of right foot with fat layer exposed: Secondary | ICD-10-CM | POA: Diagnosis not present

## 2017-08-01 DIAGNOSIS — S91301A Unspecified open wound, right foot, initial encounter: Secondary | ICD-10-CM | POA: Diagnosis not present

## 2017-08-01 NOTE — Telephone Encounter (Signed)
Left message for Arlene to call back.

## 2017-08-04 NOTE — Telephone Encounter (Signed)
Called [facility] and spoke with Abbe [place] who stated to me that all they are needing is the Rx to be signed by the physician.  Rodena Piety, please advise if you have the Rx that needs to be signed by Dr. Elsworth Soho so [facility] can finalize the request for pt's CPAP.  Thanks!

## 2017-08-04 NOTE — Telephone Encounter (Signed)
Pt calling back about [facility] and the Cpap. Per Pt wife, [facility] has the Rx for the Cpap however it is not signed so they can not process it at this time. Cb [phone] [facility] [phone].

## 2017-08-04 NOTE — Telephone Encounter (Signed)
Spoke with patient's wife Arlene. Advised her that [facility] had been faxing over several requests for a signed RX, without the correct forms. For CPAP machines, we sent in requests electronically, not via fax. Patient verbalized understanding.   Form has been received and faxed back to [place]. Will keep in my folder for a week and call [facility] tomorrow to make sure that they have the form and nothing else is needed.

## 2017-08-04 NOTE — Telephone Encounter (Signed)
Pt is calling back the order was supposed to be signed by the dr and sent back to [place]. [facility] said that we have not signed the the form. Pt was very  agrivated and upset   Pt wants someone to call her back [phone]

## 2017-08-04 NOTE — Telephone Encounter (Signed)
I have retrieved the paper from [facility] and placed in RA folder and advised Cherina. Will await and fax back to [place].

## 2017-08-06 DIAGNOSIS — B351 Tinea unguium: Secondary | ICD-10-CM | POA: Diagnosis not present

## 2017-08-06 DIAGNOSIS — M79671 Pain in right foot: Secondary | ICD-10-CM | POA: Diagnosis not present

## 2017-08-06 DIAGNOSIS — M79672 Pain in left foot: Secondary | ICD-10-CM | POA: Diagnosis not present

## 2017-08-07 DIAGNOSIS — L97822 Non-pressure chronic ulcer of other part of left lower leg with fat layer exposed: Secondary | ICD-10-CM | POA: Diagnosis not present

## 2017-08-07 DIAGNOSIS — I890 Lymphedema, not elsewhere classified: Secondary | ICD-10-CM | POA: Diagnosis not present

## 2017-08-07 DIAGNOSIS — L97512 Non-pressure chronic ulcer of other part of right foot with fat layer exposed: Secondary | ICD-10-CM | POA: Diagnosis not present

## 2017-08-07 DIAGNOSIS — L97222 Non-pressure chronic ulcer of left calf with fat layer exposed: Secondary | ICD-10-CM | POA: Diagnosis not present

## 2017-08-07 DIAGNOSIS — I872 Venous insufficiency (chronic) (peripheral): Secondary | ICD-10-CM | POA: Diagnosis not present

## 2017-08-07 DIAGNOSIS — L97812 Non-pressure chronic ulcer of other part of right lower leg with fat layer exposed: Secondary | ICD-10-CM | POA: Diagnosis not present

## 2017-08-08 ENCOUNTER — Telehealth: Payer: Self-pay | Admitting: Pulmonary Disease

## 2017-08-08 NOTE — Telephone Encounter (Signed)
Pt cancelled order for [facility], requesting that order be sent to [facility] attn: Raven.  Pt and wife spoke with Raven at [facility] approx 1 hour ago, is expecting the order.    PCCs please advise if order from 07/25/17 can be sent to [place].  Thanks.

## 2017-08-08 NOTE — Telephone Encounter (Signed)
Order was sent to [facility]

## 2017-08-11 ENCOUNTER — Ambulatory Visit (INDEPENDENT_AMBULATORY_CARE_PROVIDER_SITE_OTHER): Payer: Medicare Other | Admitting: Internal Medicine

## 2017-08-11 ENCOUNTER — Encounter: Payer: Self-pay | Admitting: Internal Medicine

## 2017-08-11 VITALS — BP 126/80 | HR 59 | Temp 97.6°F | Resp 14 | Ht 61.0 in | Wt 205.0 lb

## 2017-08-11 DIAGNOSIS — F329 Major depressive disorder, single episode, unspecified: Secondary | ICD-10-CM

## 2017-08-11 DIAGNOSIS — E785 Hyperlipidemia, unspecified: Secondary | ICD-10-CM | POA: Diagnosis not present

## 2017-08-11 DIAGNOSIS — F32A Depression, unspecified: Secondary | ICD-10-CM

## 2017-08-11 DIAGNOSIS — I10 Essential (primary) hypertension: Secondary | ICD-10-CM | POA: Diagnosis not present

## 2017-08-11 NOTE — Assessment & Plan Note (Addendum)
HTN: BP today is very good, on metoprolol. Hyperlipidemia: Left FLP satisfactory DJD, rheumatoid arthritis: The patient still have pain however per last rheumatology note the pain improved with a combination of MTX and Xeljanz. Bipolar, depression, schizophrenia: On multiple medications, patient is doing okay emotionally, denies suicidal ideas.  He is counseled to the best of my ability. Sees multiple MDs, no labs necessary today RTC 6 months

## 2017-08-11 NOTE — Patient Instructions (Signed)
   GO TO THE FRONT DESK Schedule your next appointment for a  Check up in 6 months  

## 2017-08-11 NOTE — Progress Notes (Signed)
Subjective:    Patient ID: Shaun Yoder, male    DOB: April 01, 1946, 72 y.o.   MRN: [id number]  DOS:  08/11/2017 Type of visit - description : rov Interval history: Since the last office visit he has been relatively stable. Had knee and hip pain, saw 2 orthopedic doctors, no aggressive intervention is planned. Saw Dr. Marin Olp, note reviewed. Has been seen at the wound care center for blisters @ [facility]. Emotionally doing okay although sometimes he feels discourage about his own health.  He is unable to do much without the help of his wife.  Review of Systems  Denies suicidal ideations  Past Medical History:  Diagnosis Date  . Anemia   . Cancer ([place])    bladder  . Colitis   . Colon polyps   . Depression   . Diverticulosis   . DVT of deep femoral vein ([place])   . Gastritis   . GERD (gastroesophageal reflux disease)   . History of rectal polyps   . Hyperlipidemia   . Hypertension   . Iron deficiency   . Low back pain   . OA (osteoarthritis)   . Osteonecrosis of shoulder region ([place])   . Schizoaffective disorder, bipolar type ([place])   . Sleep apnea   . Varicose vein     Past Surgical History:  Procedure Laterality Date  . BLADDER SURGERY    . TOTAL KNEE ARTHROPLASTY Bilateral   . VARICOSE VEIN SURGERY      Social History   Socioeconomic History  . Marital status: Married    Spouse name: Not on file  . Number of children: 2  . Years of education: Not on file  . Highest education level: Not on file  Social Needs  . Financial resource strain: Not on file  . Food insecurity - worry: Not on file  . Food insecurity - inability: Not on file  . Transportation needs - medical: Not on file  . Transportation needs - non-medical: Not on file  Occupational History  . Occupation: retired  Tobacco Use  . Smoking status: Former Smoker    Packs/day: 2.50    Years: 43.00    Pack years: 107.50    Types: Cigarettes    Last attempt to quit: 07/22/1998    Years since quitting:  19.0  . Smokeless tobacco: Never Used  Substance and Sexual Activity  . Alcohol use: Yes    Alcohol/week: 0.0 oz    Comment: Occasional glass of wine  . Drug use: No  . Sexual activity: Not on file  Other Topics Concern  . Not on file  Social History Narrative   Originally from [place]. Moved to [facility] in July 22, 1985. 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 No known asbestos exposure. 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 No international travel. Has a dog currently. Remote exposure to Cockatiel in a different home. No mold exposure. No hot tub exposure.       Two adopted children      Allergies as of 08/11/2017      Reactions   Leflunomide Other (See Comments)   diarrhea   Morphine And Related Nausea And Vomiting   Plaquenil [hydroxychloroquine Sulfate]    rash      Medication List        Accurate as of 08/11/17  6:10 PM.  Always use your most recent med list.          acetaminophen-codeine 300-30 MG tablet Commonly known as:  TYLENOL #3 Take 1 tablet by mouth every 4 (four) hours as needed for moderate pain.   apixaban 5 MG Tabs tablet Commonly known as:  ELIQUIS Take 1 tablet (5 mg total) by mouth 2 (two) times daily.   ARIPiprazole 30 MG tablet Commonly known as:  ABILIFY Take 30 mg by mouth at bedtime.   aspirin EC 81 MG tablet Take 81 mg by mouth daily.   CALCIUM 500/D 500-200 MG-UNIT tablet Generic drug:  calcium-vitamin D Take by mouth.   divalproex 500 MG 24 hr tablet Commonly known as:  DEPAKOTE ER 3 tabs at bedtime   folic acid 1 MG tablet Commonly known as:  FOLVITE Take 1 mg by mouth daily.   gabapentin 300 MG capsule Commonly known as:  NEURONTIN Take 300 mg by mouth 2 (two) times daily.   hydrocortisone cream 1 % Apply on affected areas on face and behind ears twice daily for 14  days.   ketoconazole 2 % shampoo Commonly known as:  NIZORAL Lather and massage into scalp 2-3 times per week. Leave for 5 minutes before rinsing.   methotrexate 2.5 MG tablet Commonly known as:  RHEUMATREX Take 17.5 mg by mouth once a week. Reported on 12/06/2015   metoprolol succinate 25 MG 24 hr tablet Commonly known as:  TOPROL-XL Take 12.5 mg by mouth daily.   mirabegron ER 50 MG Tb24 tablet Commonly known as:  MYRBETRIQ Take 50 mg by mouth daily.   Omega-3 1000 MG Caps Take by mouth.   omeprazole 20 MG capsule Commonly known as:  PRILOSEC Take 20 mg by mouth every morning.   oxybutynin 5 MG tablet Commonly known as:  DITROPAN Take 5 mg by mouth 2 (two) times daily.   predniSONE 5 MG tablet Commonly known as:  DELTASONE Take 5 mg by mouth daily with breakfast.   trimethoprim-polymyxin b ophthalmic solution Commonly known as:  POLYTRIM Place 1 drop into both eyes every 4 (four) hours.   vitamin C 500 MG tablet Commonly known as:  ASCORBIC ACID Take 500 mg by mouth daily.   XELJANZ XR 11 MG Tb24 Generic drug:  Tofacitinib Citrate Take 1 tablet by mouth daily.   ziprasidone 60 MG capsule Commonly known as:  GEODON Take 80 mg by mouth daily.          Objective:   Physical Exam BP 126/80 (BP Location: Right Arm, Patient Position: Sitting, Cuff Size: Normal)   Pulse (!) 59   Temp 97.6 F (36.4 C) (Oral)   Resp 14   Ht 5\' 1"  (1.549 m)   Wt 205 lb (93 kg)   SpO2 92%   BMI 38.73 kg/m  General:   Well developed, elderly gentleman, appears older than his stated age, sitting in a wheelchair HEENT:  Normocephalic . Face symmetric, atraumatic Lungs:  CTA B Normal respiratory effort, no intercostal retractions, no accessory muscle use. Heart: RRR, significant systolic murmur.  Skin: Not pale. Not jaundice Neurologic:  alert & oriented X3.  Speech normal, gait not tested Psych--  Cognition and judgment appear intact.  Cooperative with normal  attention span and concentration.  Behavior appropriate. No anxious or depressed appearing.      Assessment & Plan:    Assessment   (new pt, transfer from [facility],02-2015) Hypertension Hyperlipidemia Ao stenosis, severe: Dr Burt Knack MSK: --Seronegative Rheumatoid arthritis-- Dr Lenna Gilford  --  DJD-- DR Tamera Punt , dr Ronnie Derby --low back pain d/t DJD, Dr Sherlyn Lick  PSYCH: Schizophrenia, bipolar, depression: Follow-up at the [place] in [facility] GU: Bladder cancer-transitional cell, Dr. Shona Needles GI:   GERD, history of colitis, history of gastritis,  colon polyps- had a virtual cscope 2016, + polyps, declined further eval Pulmomany: Dr Ashok Cordia --Sleep apnea-- Cpap intolerant --CT nodules f/u 2 pulmonary  --hypoxemia, dx 01-2017, unable to get O2 from medicare, will see pulmonary L LEG DVT 02-2016 Varicose veins: s/p remote surgery, has LE wounds  H[facility]  [facility], psych, Ortho, pain mngmt   PLAN  HTN: BP today is very good, on metoprolol. Hyperlipidemia: Left FLP satisfactory DJD, rheumatoid arthritis: The patient still have pain however per last rheumatology note the pain improved with a combination of MTX and Xeljanz. Bipolar, depression, schizophrenia: On multiple medications, patient is doing okay emotionally, denies suicidal ideas.  He is counseled to the best of my ability. Sees multiple MDs, no labs necessary today RTC 6 months

## 2017-08-11 NOTE — Progress Notes (Signed)
Pre visit review using our clinic review tool, if applicable. No additional management support is needed unless otherwise documented below in the visit note. 

## 2017-08-21 DIAGNOSIS — S8992XD Unspecified injury of left lower leg, subsequent encounter: Secondary | ICD-10-CM | POA: Diagnosis not present

## 2017-08-21 DIAGNOSIS — L97822 Non-pressure chronic ulcer of other part of left lower leg with fat layer exposed: Secondary | ICD-10-CM | POA: Diagnosis not present

## 2017-08-21 DIAGNOSIS — L97512 Non-pressure chronic ulcer of other part of right foot with fat layer exposed: Secondary | ICD-10-CM | POA: Diagnosis not present

## 2017-08-21 DIAGNOSIS — L97812 Non-pressure chronic ulcer of other part of right lower leg with fat layer exposed: Secondary | ICD-10-CM | POA: Diagnosis not present

## 2017-08-21 DIAGNOSIS — I890 Lymphedema, not elsewhere classified: Secondary | ICD-10-CM | POA: Diagnosis not present

## 2017-08-21 DIAGNOSIS — I872 Venous insufficiency (chronic) (peripheral): Secondary | ICD-10-CM | POA: Diagnosis not present

## 2017-08-21 DIAGNOSIS — L97222 Non-pressure chronic ulcer of left calf with fat layer exposed: Secondary | ICD-10-CM | POA: Diagnosis not present

## 2017-08-25 DIAGNOSIS — Z6838 Body mass index (BMI) 38.0-38.9, adult: Secondary | ICD-10-CM | POA: Diagnosis not present

## 2017-08-25 DIAGNOSIS — E669 Obesity, unspecified: Secondary | ICD-10-CM | POA: Diagnosis not present

## 2017-08-25 DIAGNOSIS — M150 Primary generalized (osteo)arthritis: Secondary | ICD-10-CM | POA: Diagnosis not present

## 2017-08-25 DIAGNOSIS — M2550 Pain in unspecified joint: Secondary | ICD-10-CM | POA: Diagnosis not present

## 2017-08-25 DIAGNOSIS — M0589 Other rheumatoid arthritis with rheumatoid factor of multiple sites: Secondary | ICD-10-CM | POA: Diagnosis not present

## 2017-08-25 DIAGNOSIS — Z79899 Other long term (current) drug therapy: Secondary | ICD-10-CM | POA: Diagnosis not present

## 2017-08-28 ENCOUNTER — Emergency Department (HOSPITAL_BASED_OUTPATIENT_CLINIC_OR_DEPARTMENT_OTHER): Payer: Medicare Other

## 2017-08-28 ENCOUNTER — Encounter (HOSPITAL_BASED_OUTPATIENT_CLINIC_OR_DEPARTMENT_OTHER): Payer: Self-pay | Admitting: *Deleted

## 2017-08-28 ENCOUNTER — Ambulatory Visit: Payer: Self-pay | Admitting: *Deleted

## 2017-08-28 ENCOUNTER — Inpatient Hospital Stay (HOSPITAL_BASED_OUTPATIENT_CLINIC_OR_DEPARTMENT_OTHER)
Admission: EM | Admit: 2017-08-28 | Discharge: 2017-09-04 | DRG: 603 | Disposition: A | Discharge status: Skilled Nursing Facility | Payer: Medicare Other | Attending: Internal Medicine | Admitting: Internal Medicine

## 2017-08-28 ENCOUNTER — Other Ambulatory Visit: Payer: Self-pay

## 2017-08-28 DIAGNOSIS — R52 Pain, unspecified: Secondary | ICD-10-CM | POA: Diagnosis not present

## 2017-08-28 DIAGNOSIS — Z888 Allergy status to other drugs, medicaments and biological substances status: Secondary | ICD-10-CM

## 2017-08-28 DIAGNOSIS — I82502 Chronic embolism and thrombosis of unspecified deep veins of left lower extremity: Secondary | ICD-10-CM | POA: Diagnosis present

## 2017-08-28 DIAGNOSIS — J181 Lobar pneumonia, unspecified organism: Secondary | ICD-10-CM | POA: Diagnosis not present

## 2017-08-28 DIAGNOSIS — I452 Bifascicular block: Secondary | ICD-10-CM | POA: Diagnosis present

## 2017-08-28 DIAGNOSIS — L03116 Cellulitis of left lower limb: Principal | ICD-10-CM

## 2017-08-28 DIAGNOSIS — M7989 Other specified soft tissue disorders: Secondary | ICD-10-CM | POA: Diagnosis not present

## 2017-08-28 DIAGNOSIS — I10 Essential (primary) hypertension: Secondary | ICD-10-CM | POA: Diagnosis present

## 2017-08-28 DIAGNOSIS — L02416 Cutaneous abscess of left lower limb: Secondary | ICD-10-CM | POA: Diagnosis present

## 2017-08-28 DIAGNOSIS — R7881 Bacteremia: Secondary | ICD-10-CM

## 2017-08-28 DIAGNOSIS — Z79899 Other long term (current) drug therapy: Secondary | ICD-10-CM

## 2017-08-28 DIAGNOSIS — K219 Gastro-esophageal reflux disease without esophagitis: Secondary | ICD-10-CM | POA: Diagnosis present

## 2017-08-28 DIAGNOSIS — I82402 Acute embolism and thrombosis of unspecified deep veins of left lower extremity: Secondary | ICD-10-CM | POA: Diagnosis not present

## 2017-08-28 DIAGNOSIS — I351 Nonrheumatic aortic (valve) insufficiency: Secondary | ICD-10-CM | POA: Diagnosis not present

## 2017-08-28 DIAGNOSIS — L97929 Non-pressure chronic ulcer of unspecified part of left lower leg with unspecified severity: Secondary | ICD-10-CM | POA: Diagnosis present

## 2017-08-28 DIAGNOSIS — R2681 Unsteadiness on feet: Secondary | ICD-10-CM | POA: Diagnosis not present

## 2017-08-28 DIAGNOSIS — I872 Venous insufficiency (chronic) (peripheral): Secondary | ICD-10-CM | POA: Diagnosis present

## 2017-08-28 DIAGNOSIS — Z111 Encounter for screening for respiratory tuberculosis: Secondary | ICD-10-CM | POA: Diagnosis not present

## 2017-08-28 DIAGNOSIS — K5900 Constipation, unspecified: Secondary | ICD-10-CM | POA: Diagnosis not present

## 2017-08-28 DIAGNOSIS — L03119 Cellulitis of unspecified part of limb: Secondary | ICD-10-CM | POA: Diagnosis not present

## 2017-08-28 DIAGNOSIS — Z6841 Body Mass Index (BMI) 40.0 and over, adult: Secondary | ICD-10-CM | POA: Diagnosis not present

## 2017-08-28 DIAGNOSIS — R600 Localized edema: Secondary | ICD-10-CM | POA: Diagnosis not present

## 2017-08-28 DIAGNOSIS — R0600 Dyspnea, unspecified: Secondary | ICD-10-CM | POA: Diagnosis not present

## 2017-08-28 DIAGNOSIS — L03032 Cellulitis of left toe: Secondary | ICD-10-CM | POA: Diagnosis not present

## 2017-08-28 DIAGNOSIS — Z96653 Presence of artificial knee joint, bilateral: Secondary | ICD-10-CM | POA: Diagnosis present

## 2017-08-28 DIAGNOSIS — L0390 Cellulitis, unspecified: Secondary | ICD-10-CM | POA: Diagnosis present

## 2017-08-28 DIAGNOSIS — Z7901 Long term (current) use of anticoagulants: Secondary | ICD-10-CM | POA: Diagnosis not present

## 2017-08-28 DIAGNOSIS — R4182 Altered mental status, unspecified: Secondary | ICD-10-CM | POA: Diagnosis not present

## 2017-08-28 DIAGNOSIS — Z8551 Personal history of malignant neoplasm of bladder: Secondary | ICD-10-CM | POA: Diagnosis not present

## 2017-08-28 DIAGNOSIS — G4730 Sleep apnea, unspecified: Secondary | ICD-10-CM | POA: Diagnosis present

## 2017-08-28 DIAGNOSIS — I4891 Unspecified atrial fibrillation: Secondary | ICD-10-CM | POA: Diagnosis not present

## 2017-08-28 DIAGNOSIS — Z8249 Family history of ischemic heart disease and other diseases of the circulatory system: Secondary | ICD-10-CM

## 2017-08-28 DIAGNOSIS — Z7952 Long term (current) use of systemic steroids: Secondary | ICD-10-CM | POA: Diagnosis not present

## 2017-08-28 DIAGNOSIS — Z7982 Long term (current) use of aspirin: Secondary | ICD-10-CM | POA: Diagnosis not present

## 2017-08-28 DIAGNOSIS — Z87891 Personal history of nicotine dependence: Secondary | ICD-10-CM

## 2017-08-28 DIAGNOSIS — Z882 Allergy status to sulfonamides status: Secondary | ICD-10-CM

## 2017-08-28 DIAGNOSIS — M1990 Unspecified osteoarthritis, unspecified site: Secondary | ICD-10-CM | POA: Diagnosis present

## 2017-08-28 DIAGNOSIS — M069 Rheumatoid arthritis, unspecified: Secondary | ICD-10-CM | POA: Diagnosis present

## 2017-08-28 DIAGNOSIS — B965 Pseudomonas (aeruginosa) (mallei) (pseudomallei) as the cause of diseases classified elsewhere: Secondary | ICD-10-CM | POA: Diagnosis present

## 2017-08-28 DIAGNOSIS — Z885 Allergy status to narcotic agent status: Secondary | ICD-10-CM

## 2017-08-28 DIAGNOSIS — J90 Pleural effusion, not elsewhere classified: Secondary | ICD-10-CM | POA: Diagnosis not present

## 2017-08-28 DIAGNOSIS — F250 Schizoaffective disorder, bipolar type: Secondary | ICD-10-CM | POA: Diagnosis present

## 2017-08-28 DIAGNOSIS — I350 Nonrheumatic aortic (valve) stenosis: Secondary | ICD-10-CM | POA: Diagnosis not present

## 2017-08-28 DIAGNOSIS — E569 Vitamin deficiency, unspecified: Secondary | ICD-10-CM | POA: Diagnosis not present

## 2017-08-28 DIAGNOSIS — R279 Unspecified lack of coordination: Secondary | ICD-10-CM | POA: Diagnosis not present

## 2017-08-28 DIAGNOSIS — N183 Chronic kidney disease, stage 3 (moderate): Secondary | ICD-10-CM | POA: Diagnosis not present

## 2017-08-28 DIAGNOSIS — E785 Hyperlipidemia, unspecified: Secondary | ICD-10-CM | POA: Diagnosis present

## 2017-08-28 DIAGNOSIS — A419 Sepsis, unspecified organism: Secondary | ICD-10-CM | POA: Diagnosis not present

## 2017-08-28 DIAGNOSIS — M6281 Muscle weakness (generalized): Secondary | ICD-10-CM | POA: Diagnosis not present

## 2017-08-28 DIAGNOSIS — R0602 Shortness of breath: Secondary | ICD-10-CM | POA: Diagnosis not present

## 2017-08-28 DIAGNOSIS — N3281 Overactive bladder: Secondary | ICD-10-CM | POA: Diagnosis not present

## 2017-08-28 LAB — URINALYSIS, ROUTINE W REFLEX MICROSCOPIC
BILIRUBIN URINE: NEGATIVE
Glucose, UA: NEGATIVE mg/dL
Ketones, ur: NEGATIVE mg/dL
Leukocytes, UA: NEGATIVE
NITRITE: NEGATIVE
PH: 8.5 — AB (ref 5.0–8.0)
Protein, ur: NEGATIVE mg/dL
SPECIFIC GRAVITY, URINE: 1.015 (ref 1.005–1.030)

## 2017-08-28 LAB — CBC WITH DIFFERENTIAL/PLATELET
Basophils Absolute: 0 10*3/uL (ref 0.0–0.1)
Basophils Relative: 0 %
EOS PCT: 0 %
Eosinophils Absolute: 0 10*3/uL (ref 0.0–0.7)
HCT: 39.3 % (ref 39.0–52.0)
Hemoglobin: 13.2 g/dL (ref 13.0–17.0)
LYMPHS ABS: 0.4 10*3/uL — AB (ref 0.7–4.0)
LYMPHS PCT: 3 %
MCH: 33.6 pg (ref 26.0–34.0)
MCHC: 33.6 g/dL (ref 30.0–36.0)
MCV: 100 fL (ref 78.0–100.0)
MONO ABS: 0.7 10*3/uL (ref 0.1–1.0)
Monocytes Relative: 5 %
Neutro Abs: 12.9 10*3/uL — ABNORMAL HIGH (ref 1.7–7.7)
Neutrophils Relative %: 92 %
PLATELETS: 121 10*3/uL — AB (ref 150–400)
RBC: 3.93 MIL/uL — AB (ref 4.22–5.81)
RDW: 14.3 % (ref 11.5–15.5)
WBC: 14 10*3/uL — ABNORMAL HIGH (ref 4.0–10.5)

## 2017-08-28 LAB — COMPREHENSIVE METABOLIC PANEL
ALT: 23 U/L (ref 17–63)
AST: 34 U/L (ref 15–41)
Albumin: 3.6 g/dL (ref 3.5–5.0)
Alkaline Phosphatase: 62 U/L (ref 38–126)
Anion gap: 7 (ref 5–15)
BUN: 25 mg/dL — AB (ref 6–20)
CO2: 30 mmol/L (ref 22–32)
CREATININE: 0.71 mg/dL (ref 0.61–1.24)
Calcium: 8.7 mg/dL — ABNORMAL LOW (ref 8.9–10.3)
Chloride: 102 mmol/L (ref 101–111)
GFR calc Af Amer: >60 mL/min (ref >60)
GFR calc non Af Amer: >60 mL/min (ref >60)
GLUCOSE: 100 mg/dL — AB (ref 65–99)
Potassium: 4.3 mmol/L (ref 3.5–5.1)
SODIUM: 139 mmol/L (ref 135–145)
Total Bilirubin: 0.8 mg/dL (ref 0.3–1.2)
Total Protein: 6.9 g/dL (ref 6.5–8.1)

## 2017-08-28 LAB — PROTIME-INR
INR: 1.12
Prothrombin Time: 14.3 seconds (ref 11.4–15.2)

## 2017-08-28 LAB — INFLUENZA PANEL BY PCR (TYPE A & B)
INFLBPCR: NEGATIVE
Influenza A By PCR: NEGATIVE

## 2017-08-28 LAB — URINALYSIS, MICROSCOPIC (REFLEX)
BACTERIA UA: NONE SEEN
SQUAMOUS EPITHELIAL / LPF: NONE SEEN
WBC UA: NONE SEEN WBC/hpf (ref 0–5)

## 2017-08-28 LAB — I-STAT CG4 LACTIC ACID, ED
Lactic Acid, Venous: 1.6 mmol/L (ref 0.5–1.9)
Lactic Acid, Venous: 1.36 mmol/L (ref 0.5–1.9)

## 2017-08-28 MED ORDER — ACETAMINOPHEN 325 MG PO TABS
650.0000 mg | ORAL_TABLET | Freq: Once | ORAL | Status: AC
  Administered 2017-08-28: 650 mg via ORAL
  Filled 2017-08-28: qty 2

## 2017-08-28 MED ORDER — IOPAMIDOL (ISOVUE-300) INJECTION 61%: 100.0000 mL | Freq: Once | INTRAVENOUS | Status: DC | PRN

## 2017-08-28 MED ORDER — VANCOMYCIN HCL IN DEXTROSE 1-5 GM/200ML-% IV SOLN
1000.0000 mg | Freq: Once | INTRAVENOUS | Status: AC
  Administered 2017-08-28: 1000 mg via INTRAVENOUS
  Filled 2017-08-28: qty 200

## 2017-08-28 MED ORDER — SODIUM CHLORIDE 0.9 % IV BOLUS (SEPSIS)
500.0000 mL | Freq: Once | INTRAVENOUS | Status: AC
  Administered 2017-08-28: 500 mL via INTRAVENOUS

## 2017-08-28 MED ORDER — ACETAMINOPHEN 325 MG PO TABS
650.0000 mg | ORAL_TABLET | Freq: Once | ORAL | Status: AC
Start: 2017-08-28 — End: 2017-08-28
  Administered 2017-08-28: 650 mg via ORAL
  Filled 2017-08-28: qty 2

## 2017-08-28 NOTE — ED Notes (Signed)
Placed on 2l/m [place] O2, SpO2 90-92% when at rest.

## 2017-08-28 NOTE — ED Provider Notes (Signed)
[facility] EMERGENCY DEPARTMENT Provider Note   CSN: [id number] Arrival date & time: 08/28/17  1325     History   Chief Complaint Chief Complaint  Patient presents with  . Shortness of Breath    HPI Shaun Yoder is a 72 y.o. male.  HPI  patient presents generalized weakness.  Found to have fever of 102.4 here.  Has some mild shortness of breath but states it really just feels bad.  No nausea or vomiting.  No diarrhea.  No dysuria.  Has some chronic swelling in both his legs but states his left lower leg is much more swollen than normal.  It is red and inflamed also.  Previous DVT in that leg.  Is on Xarelto.  No fall.  No sore throat.  Swelling began a couple days ago. Past Medical History:  Diagnosis Date  . Anemia   . Cancer ([place])    bladder  . Colitis   . Colon polyps   . Depression   . Diverticulosis   . DVT of deep femoral vein ([place])   . Gastritis   . GERD (gastroesophageal reflux disease)   . History of rectal polyps   . Hyperlipidemia   . Hypertension   . Iron deficiency   . Low back pain   . OA (osteoarthritis)   . Osteonecrosis of shoulder region ([place])   . Schizoaffective disorder, bipolar type ([place])   . Sleep apnea   . Varicose vein     Patient Active Problem List   Diagnosis Date Noted  . Nocturnal hypoxemia 02/11/2017  . Primary osteoarthritis of left hip 12/06/2016  . Chronic right shoulder pain 12/06/2016  . Contracture of joint of right shoulder region 12/06/2016  . Chronic left hip pain 09/12/2016  . Status post bilateral knee replacements 09/12/2016  . Gait disorder 06/03/2016  . Left leg DVT ([place]) 04/04/2016  . Non-pressure chronic ulcer of right ankle with fat layer exposed ([place]) 10/25/2015  . Varicose veins of lower extremities with ulcer ([place]) 09/07/2015  . Annual physical exam 05/23/2015  . PCP NOTES >>>>>>>>>>>>>>>>> 03/28/2015  . Hypertension 03/28/2015  . Pulmonary nodules 12/09/2014  . Sleep apnea 11/14/2014  .  GERD (gastroesophageal reflux disease) 11/14/2014  . Morbid obesity ([place]) 11/14/2014  . Aortic stenosis, severe   . Schizoaffective disorder, bipolar type ([place])   . Rheumatoid arthritis ([place])   . Hyperlipidemia   . Bifasicular block   . Mononeuritis of upper limb 05/20/2013  . Pain in soft tissues of limb 05/20/2013  . 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 Depression 01/13/2013  . Memory loss 01/13/2013    Past Surgical History:  Procedure Laterality Date  . BLADDER SURGERY    . TOTAL KNEE ARTHROPLASTY Bilateral   . VARICOSE VEIN SURGERY         Home Medications    Prior to Admission medications   Medication Sig Start Date End Date Taking? Authorizing Provider  acetaminophen-codeine (TYLENOL #3) 300-30 MG tablet Take 1 tablet by mouth every 4 (four) hours as needed for moderate pain.    [provider]  apixaban (ELIQUIS) 5 MG TABS tablet Take 1 tablet (5 mg total) by mouth 2 (two) times daily. 03/25/17   Colon Branch, MD  ARIPiprazole (ABILIFY) 30 MG tablet Take 30 mg by mouth at bedtime.      [provider]  aspirin EC 81 MG tablet Take 81 mg by mouth daily.    [provider]  calcium-vitamin D (CALCIUM 500/D)  500-200 MG-UNIT tablet Take by mouth.    [provider]  divalproex (DEPAKOTE ER) 500 MG 24 hr tablet 3 tabs at bedtime    [provider]  folic acid (FOLVITE) 1 MG tablet Take 1 mg by mouth daily.      [provider]  gabapentin (NEURONTIN) 300 MG capsule Take 300 mg by mouth 2 (two) times daily.     [provider]  hydrocortisone cream 1 % Apply on affected areas on face and behind ears twice daily for 14 days. 01/01/17   [name], DO  ketoconazole (NIZORAL) 2 % shampoo Lather and massage into scalp 2-3 times per week. Leave for 5 minutes before rinsing. 07/07/17   Colon Branch, MD  methotrexate (RHEUMATREX) 2.5 MG tablet Take 17.5 mg by mouth once a week. Reported on 12/06/2015    [name], MD  metoprolol succinate (TOPROL-XL) 25 MG 24 hr tablet Take 12.5 mg by mouth daily.    [provider]  mirabegron ER (MYRBETRIQ) 50 MG TB24 tablet Take 50 mg by mouth daily.    [provider]  Omega-3 1000 MG CAPS Take by mouth.    [provider]  omeprazole (PRILOSEC) 20 MG capsule Take 20 mg by mouth every morning.     [provider]  oxybutynin (DITROPAN) 5 MG tablet Take 5 mg by mouth 2 (two) times daily.    [provider]  predniSONE (DELTASONE) 5 MG tablet Take 5 mg by mouth daily with breakfast.    [provider]  Tofacitinib Citrate (XELJANZ XR) 11 MG TB24 Take 1 tablet by mouth daily.    [provider]  trimethoprim-polymyxin b (POLYTRIM) ophthalmic solution Place 1 drop into both eyes every 4 (four) hours. 01/01/17   [name], DO  vitamin C (ASCORBIC ACID) 500 MG tablet Take 500 mg by mouth daily.    [provider]  ziprasidone (GEODON) 60 MG capsule Take 80 mg by mouth daily.     [provider]    Family History Family History  Problem Relation Age of Onset  . Heart attack Mother   . Heart attack Father   . Stroke Father   . Rheumatologic disease Neg Hx   . Colon cancer Neg Hx   . Lung disease Neg Hx   . Prostate cancer Neg Hx     Social History Social History   Tobacco Use  . Smoking status: Former Smoker    Packs/day: 2.50    Years: 43.00    Pack years: 107.50    Types: Cigarettes    Last attempt to quit: 07/22/1998    Years since quitting: 19.1  . Smokeless tobacco: Never Used  Substance Use Topics  . Alcohol use: Yes    Alcohol/week: 0.0 oz    Comment: Occasional glass of wine  . Drug use: No     Allergies   Leflunomide; Morphine and related; and Plaquenil [hydroxychloroquine sulfate]   Review of Systems Review of Systems  Constitutional: Positive for appetite change, fatigue and fever.  HENT: Negative for congestion.   Respiratory:  Positive for shortness of breath.   Cardiovascular: Positive for leg swelling.  Gastrointestinal: Negative for abdominal pain.  Endocrine: Negative for polyuria.  Genitourinary: Negative for flank pain.  Musculoskeletal: Negative for back pain.  Skin: Positive for color change.  Neurological: Negative for headaches.  Hematological: Negative for adenopathy.  Psychiatric/Behavioral: Negative for confusion.     Physical Exam Updated Vital  Signs BP 107/75   Pulse 69   Temp (!) 102.1 F (38.9 C) (Oral)   Resp 19   SpO2 98%   Physical Exam  Constitutional: He appears well-developed.  HENT:  Head: Atraumatic.  Eyes: Pupils are equal, round, and reactive to light.  Cardiovascular: Regular rhythm.  Pulmonary/Chest: Effort normal. He has no wheezes. He has no rhonchi. He has no rales.  Musculoskeletal:  Some bruising of right forearm.  Chronic venous changes with edema of right lower leg.  Left lower leg is much more swollen and erythematous with induration and blisters.         ED Treatments / Results  Labs (all labs ordered are listed, but only abnormal results are displayed) Labs Reviewed  COMPREHENSIVE METABOLIC PANEL - Abnormal; Notable for the following components:      Result Value   Glucose, Bld 100 (*)    BUN 25 (*)    Calcium 8.7 (*)    All other components within normal limits  CBC WITH DIFFERENTIAL/PLATELET - Abnormal; Notable for the following components:   WBC 14.0 (*)    RBC 3.93 (*)    Platelets 121 (*)    Neutro Abs 12.9 (*)    Lymphs Abs 0.4 (*)    All other components within normal limits  URINALYSIS, ROUTINE W REFLEX MICROSCOPIC - Abnormal; Notable for the following components:   pH 8.5 (*)    Hgb urine dipstick TRACE (*)    All other components within normal limits  CULTURE, BLOOD (ROUTINE X 2)  CULTURE, BLOOD (ROUTINE X 2)  PROTIME-INR  INFLUENZA PANEL BY PCR (TYPE A & B)  URINALYSIS, MICROSCOPIC (REFLEX)  I-STAT CG4 LACTIC ACID, ED  I-STAT  CG4 LACTIC ACID, ED    EKG  EKG Interpretation  Date/Time:  Thursday August 28 2017 14:33:15 EST Ventricular Rate:  78 PR Interval:    QRS Duration: 178 QT Interval:  409 QTC Calculation: 466 R Axis:   -93 Text Interpretation:  Sinus rhythm RBBB and LAFB Baseline wander in lead(s) V2 No STEMI.  Confirmed by Nanda Quinton [phone]) on 08/28/2017 2:44:09 PM       Radiology Dg Chest 2 View  Result Date: 08/28/2017 CLINICAL DATA:  Shortness of breath. History of urinary bladder carcinoma EXAM: CHEST  2 VIEW COMPARISON:  January 06, 2017 FINDINGS: There is bibasilar lung scarring. There is no edema or consolidation. The heart size and pulmonary vascular normal. There is aortic atherosclerosis. No adenopathy. There is advanced erosion of both proximal humeri with foci of calcification within each shoulder joint. IMPRESSION: Bibasilar lung scarring. No edema or consolidation. Stable cardiac silhouette. There is aortic atherosclerosis. No adenopathy. Fragmentation and advanced erosion of both proximal humeri noted. Aortic Atherosclerosis (ICD10-I70.0). Electronically Signed   By: Lowella Grip III M.D.   On: 08/28/2017 13:57   US Venous Img Lower Unilateral Left  Result Date: 08/28/2017 CLINICAL DATA:  72 year old male with left lower extremity discoloration and pain EXAM: LEFT LOWER EXTREMITY VENOUS DOPPLER ULTRASOUND TECHNIQUE: Gray-scale sonography with graded compression, as well as color Doppler and duplex ultrasound were performed to evaluate the lower extremity deep venous systems from the level of the common femoral vein and including the common femoral, femoral, profunda femoral, popliteal and calf veins including the posterior tibial, peroneal and gastrocnemius veins when visible. The superficial great saphenous vein was also interrogated. Spectral Doppler was utilized to evaluate flow at rest and with distal augmentation maneuvers in the common femoral, femoral and popliteal veins.  COMPARISON:  None. FINDINGS: Contralateral Common Femoral Vein: Respiratory phasicity is normal and symmetric with the symptomatic side. No evidence of thrombus. Normal compressibility. Common Femoral Vein: No evidence of thrombus. Normal compressibility, respiratory phasicity and response to augmentation. Saphenofemoral Junction: No evidence of thrombus. Normal compressibility and flow on color Doppler imaging. Profunda Femoral Vein: No evidence of thrombus. Normal compressibility and flow on color Doppler imaging. Femoral Vein: No evidence of thrombus. Normal compressibility, respiratory phasicity and response to augmentation. Popliteal Vein: No evidence of thrombus. Normal compressibility, respiratory phasicity and response to augmentation. Calf Veins: No evidence of thrombus within the posterior tibial veins. The peroneal veins are not well seen. Superficial Great Saphenous Vein: No evidence of thrombus. Normal compressibility. Venous Reflux:  None. Other Findings: Superficial subcutaneous edema in the soft tissues of the left lower extremity. IMPRESSION: 1. No evidence of deep venous thrombosis to the level of the knee. Additionally, no evidence of deep venous thrombosis in the posterior tibial vein. The peroneal veins are not well seen secondary to lower extremity edema and patient discomfort. Electronically Signed   By: Jacqulynn Cadet M.D.   On: 08/28/2017 17:08   Ct Extremity Lower Left W Contrast  Result Date: 08/28/2017 CLINICAL DATA:  Severe shortness of breath worsening over past 2 days, LEFT leg swelling increased more than usual, tremors, severe shortness of breath with minimal exertion, sore at knee being followed by wound care, extreme pain, fever of unknown origin EXAM: CT OF THE LOWER LEFT EXTREMITY WITH CONTRAST TECHNIQUE: Multidetector CT imaging of the lower left extremity was performed according to the standard protocol following intravenous contrast administration. Sagittal and coronal  MPR images reconstructed from axial data set. COMPARISON:  None CONTRAST:  100 cc Isovue-300 IV FINDINGS: Bones/Joint/Cartilage Diffuse osseous demineralization. Advanced degenerative changes of LEFT hip joint. Components of a LEFT knee prosthesis are identified with a associated beam hardening artifacts and small joint effusion. Additional mild degenerative changes at the tibiotalar joint. No acute fracture, dislocation or bone destruction. Synovial thickening with probable minimal joint effusion at LEFT hip. Ligaments Suboptimally assessed by CT. Muscles and Tendons Scattered mild muscular atrophy. Marked diffuse atrophy of the semimembranosus. No definite intramuscular edema or focal fluid collection identified. Soft tissues Increased stool in rectum. Extensive atherosclerotic calcifications in the LEFT lower extremity. Deep venous system appears grossly patent. Scattered soft tissue edema throughout the LEFT lower extremity greatest at the lateral aspect of LEFT calf. Normal sized LEFT inguinal lymph nodes. Scattered subcutaneous venous varicosities at the medial LEFT leg above and below knee. No discrete abscess collection or focal fluid collection/hematoma identified. No evidence of soft tissue mass. IMPRESSION: Advanced degenerative changes of the LEFT hip joint with probable minimal joint effusion. Prior LEFT total knee arthroplasty with minimal joint effusion. Mild degenerative changes of the LEFT ankle. Scattered subcutaneous edema throughout the LEFT lower extremity without focal fluid collection to suggest abscess or hematoma. Atrophy of the LEFT semimembranosus muscle. Electronically Signed   By: Lavonia Dana M.D.   On: 08/28/2017 19:33    Procedures Procedures (including critical care time)  Medications Ordered in ED Medications  iopamidol (ISOVUE-300) 61 % injection 100 mL (not administered)  sodium chloride 0.9 % bolus 500 mL (500 mLs Intravenous New Bag/Given 08/28/17 1959)  acetaminophen  (TYLENOL) tablet 650 mg (650 mg Oral Given 08/28/17 1519)  vancomycin (VANCOCIN) IVPB 1000 mg/200 mL premix (0 mg Intravenous Stopped 08/28/17 1647)  sodium chloride 0.9 % bolus 500 mL (0 mLs Intravenous Stopped 08/28/17 2008)  acetaminophen (TYLENOL) tablet 650 mg (650 mg Oral Given 08/28/17 2014)     Initial Impression / Assessment and Plan / ED Course  I have reviewed the triage vital signs and the nursing notes.  Pertinent labs & imaging results that were available during my care of the patient were reviewed by me and considered in my medical decision making (see chart for details).     Patient presents with fever.  Likely from left lower extremity cellulitis.  No DVT although he is on anticoagulation.  Has CT scan that does not show fluid collection or abscess.  Had one hypotensive episode but normal lactic acid x2.  IV vancomycin given.  Will admit to hospitalist.  CRITICAL CARE Performed by: Davonna Belling Total critical care time: 30 minutes Critical care time was exclusive of separately billable procedures and treating other patients. Critical care was necessary to treat or prevent imminent or life-threatening deterioration. Critical care was time spent personally by me on the following activities: development of treatment plan with patient and/or surrogate as well as nursing, discussions with consultants, evaluation of patient's response to treatment, examination of patient, obtaining history from patient or surrogate, ordering and performing treatments and interventions, ordering and review of laboratory studies, ordering and review of radiographic studies, pulse oximetry and re-evaluation of patient's condition.  Final Clinical Impressions(s) / ED Diagnoses   Final diagnoses:  Cellulitis of left lower extremity  Sepsis, due to unspecified organism [facility])    ED Discharge Orders    None      Davonna Belling, MD 08/28/17 2025

## 2017-08-28 NOTE — Telephone Encounter (Signed)
Pts wife reports pt with severe shortness of breath, worsening past 2 days. Left leg "more swollen than usual." Tremors, discoloration of hands, nails "bluish." SOB severe with minimal exertion, transferring from bed to chair; difficulty speaking. Wife states pt has "sore on knee" followed by [facility]. Spoke with Nurse Tech pt has [place]- Fri at home. States worsening symptoms, "big change for him."  Directed pt to ED. Offered to call 911; wife states prefers to drive him; the NT and pt's daughter will get him to the ED now.  Reason for Disposition . [1] MODERATE difficulty breathing (e.g., speaks in phrases, SOB even at rest, pulse 100-120) AND [2] NEW-onset or WORSE than normal  Answer Assessment - Initial Assessment Questions 1. RESPIRATORY STATUS: "Describe your breathing?" (e.g., wheezing, shortness of breath, unable to speak, severe coughing)     Shortness of breath with minimal exertion 2. ONSET: "When did this breathing problem begin?"      Few days ago, worse this AM 3. PATTERN "Does the difficult breathing come and go, or has it been constant since it started?"     Some at rest, worse with minimal exertion 4. SEVERITY: "How bad is your breathing?" (e.g., mild, moderate, severe)    - MILD: No SOB at rest, mild SOB with walking, speaks normally in sentences, can lay down, no retractions, pulse < 100.    - MODERATE: SOB at rest, SOB with minimal exertion and prefers to sit, cannot lie down flat, speaks in phrases, mild retractions, audible wheezing, pulse 100-120.    - SEVERE: Very SOB at rest, speaks in single words, struggling to breathe, sitting hunched forward, retractions, pulse > 120      Severe with minimal exertion, can not speak 5. RECURRENT SYMPTOM: "Have you had difficulty breathing before?" If so, ask: "When was the last time?" and "What happened that time?"       6. CARDIAC HISTORY: "Do you have any history of heart disease?" (e.g., heart attack, angina, bypass  surgery, angioplasty)       7. LUNG HISTORY: "Do you have any history of lung disease?"  (e.g., pulmonary embolus, asthma, emphysema)     9. OTHER SYMPTOMS: "Do you have any other symptoms? (e.g., dizziness, runny nose, cough, chest pain, fever)     Tremors, hands and nails bluish, left leg swollen "More so than usual."  Protocols used: BREATHING DIFFICULTY-A-AH

## 2017-08-28 NOTE — ED Notes (Signed)
Patient bed assignment was changed from Room 1431 to 1503.  Report was given to [employer], RN of [facility] is here to pick up patient.

## 2017-08-28 NOTE — ED Notes (Signed)
Patient transported to CT 

## 2017-08-28 NOTE — ED Notes (Signed)
Patient transported to Ultrasound 

## 2017-08-28 NOTE — ED Triage Notes (Signed)
Sob x 5 days. He is speaking in complete sentences. No resp distress at triage. Fever on arrival. Pt states he was unaware of a fever. Legs are swollen.

## 2017-08-29 ENCOUNTER — Inpatient Hospital Stay (HOSPITAL_COMMUNITY): Payer: Medicare Other

## 2017-08-29 ENCOUNTER — Other Ambulatory Visit: Payer: Self-pay

## 2017-08-29 DIAGNOSIS — M05719 Rheumatoid arthritis with rheumatoid factor of unspecified shoulder without organ or systems involvement: Secondary | ICD-10-CM

## 2017-08-29 DIAGNOSIS — E785 Hyperlipidemia, unspecified: Secondary | ICD-10-CM

## 2017-08-29 DIAGNOSIS — A419 Sepsis, unspecified organism: Secondary | ICD-10-CM

## 2017-08-29 DIAGNOSIS — I351 Nonrheumatic aortic (valve) insufficiency: Secondary | ICD-10-CM

## 2017-08-29 DIAGNOSIS — I82412 Acute embolism and thrombosis of left femoral vein: Secondary | ICD-10-CM

## 2017-08-29 DIAGNOSIS — L03116 Cellulitis of left lower limb: Principal | ICD-10-CM

## 2017-08-29 DIAGNOSIS — L0390 Cellulitis, unspecified: Secondary | ICD-10-CM | POA: Diagnosis present

## 2017-08-29 LAB — BLOOD CULTURE ID PANEL (REFLEXED)
Acinetobacter baumannii: NOT DETECTED
CANDIDA GLABRATA: NOT DETECTED
Candida albicans: NOT DETECTED
Candida krusei: NOT DETECTED
Candida parapsilosis: NOT DETECTED
Candida tropicalis: NOT DETECTED
Carbapenem resistance: NOT DETECTED
ENTEROBACTER CLOACAE COMPLEX: NOT DETECTED
ENTEROBACTERIACEAE SPECIES: NOT DETECTED
ESCHERICHIA COLI: NOT DETECTED
Enterococcus species: NOT DETECTED
HAEMOPHILUS INFLUENZAE: NOT DETECTED
KLEBSIELLA OXYTOCA: NOT DETECTED
Klebsiella pneumoniae: NOT DETECTED
LISTERIA MONOCYTOGENES: NOT DETECTED
METHICILLIN RESISTANCE: NOT DETECTED
Neisseria meningitidis: NOT DETECTED
PSEUDOMONAS AERUGINOSA: DETECTED — AB
Proteus species: NOT DETECTED
STREPTOCOCCUS AGALACTIAE: NOT DETECTED
STREPTOCOCCUS PYOGENES: NOT DETECTED
STREPTOCOCCUS SPECIES: NOT DETECTED
Serratia marcescens: NOT DETECTED
Staphylococcus aureus (BCID): NOT DETECTED
Staphylococcus species: NOT DETECTED
Streptococcus pneumoniae: NOT DETECTED
Vancomycin resistance: NOT DETECTED

## 2017-08-29 LAB — CBC
HCT: 34 % — ABNORMAL LOW (ref 39.0–52.0)
Hemoglobin: 11.5 g/dL — ABNORMAL LOW (ref 13.0–17.0)
MCH: 33.3 pg (ref 26.0–34.0)
MCHC: 33.8 g/dL (ref 30.0–36.0)
MCV: 98.6 fL (ref 78.0–100.0)
PLATELETS: 111 10*3/uL — AB (ref 150–400)
RBC: 3.45 MIL/uL — AB (ref 4.22–5.81)
RDW: 15 % (ref 11.5–15.5)
WBC: 10.4 10*3/uL (ref 4.0–10.5)

## 2017-08-29 LAB — BASIC METABOLIC PANEL
ANION GAP: 4 — AB (ref 5–15)
BUN: 21 mg/dL — ABNORMAL HIGH (ref 6–20)
CALCIUM: 8 mg/dL — AB (ref 8.9–10.3)
CO2: 28 mmol/L (ref 22–32)
CREATININE: 0.58 mg/dL — AB (ref 0.61–1.24)
Chloride: 104 mmol/L (ref 101–111)
GLUCOSE: 99 mg/dL (ref 65–99)
Potassium: 3.8 mmol/L (ref 3.5–5.1)
Sodium: 136 mmol/L (ref 135–145)

## 2017-08-29 LAB — ECHOCARDIOGRAM COMPLETE
HEIGHTINCHES: 61 in
WEIGHTICAEL: 3668.45 [oz_av]

## 2017-08-29 LAB — TROPONIN I
Troponin I: 0.03 ng/mL (ref <0.03)
Troponin I: 0.03 ng/mL (ref <0.03)
Troponin I: <0.03 ng/mL (ref <0.03)

## 2017-08-29 MED ORDER — ZIPRASIDONE HCL 80 MG PO CAPS
80.0000 mg | ORAL_CAPSULE | Freq: Every day | ORAL | Status: DC
  Administered 2017-08-29 – 2017-09-03 (×6): 80 mg via ORAL
  Filled 2017-08-29 (×8): qty 1

## 2017-08-29 MED ORDER — POLYETHYLENE GLYCOL 3350 17 G PO PACK
17.0000 g | PACK | Freq: Every day | ORAL | Status: DC | PRN
  Administered 2017-08-29: 17 g via ORAL
  Filled 2017-08-29: qty 1

## 2017-08-29 MED ORDER — HYDROCODONE-ACETAMINOPHEN 5-325 MG PO TABS
1.0000 | ORAL_TABLET | Freq: Four times a day (QID) | ORAL | Status: DC | PRN
  Administered 2017-09-01: 1 via ORAL
  Filled 2017-08-29: qty 1

## 2017-08-29 MED ORDER — PREDNISONE 5 MG PO TABS
5.0000 mg | ORAL_TABLET | Freq: Every day | ORAL | Status: DC
  Administered 2017-08-29 – 2017-09-04 (×7): 5 mg via ORAL
  Filled 2017-08-29 (×7): qty 1

## 2017-08-29 MED ORDER — APIXABAN 5 MG PO TABS
5.0000 mg | ORAL_TABLET | Freq: Two times a day (BID) | ORAL | Status: DC
  Administered 2017-08-29 – 2017-09-04 (×14): 5 mg via ORAL
  Filled 2017-08-29 (×14): qty 1

## 2017-08-29 MED ORDER — DEXTROSE 5 % IV SOLN
2.0000 g | Freq: Three times a day (TID) | INTRAVENOUS | Status: DC
Start: 2017-08-29 — End: 2017-08-31
  Administered 2017-08-29 – 2017-08-31 (×5): 2 g via INTRAVENOUS
  Filled 2017-08-29 (×7): qty 2

## 2017-08-29 MED ORDER — PIPERACILLIN-TAZOBACTAM 3.375 G IVPB
3.3750 g | Freq: Three times a day (TID) | INTRAVENOUS | Status: DC
  Administered 2017-08-29 (×2): 3.375 g via INTRAVENOUS
  Filled 2017-08-29 (×3): qty 50

## 2017-08-29 MED ORDER — MIRABEGRON ER 25 MG PO TB24
50.0000 mg | ORAL_TABLET | Freq: Every day | ORAL | Status: DC
  Administered 2017-08-29 – 2017-09-04 (×7): 50 mg via ORAL
  Filled 2017-08-29 (×7): qty 2

## 2017-08-29 MED ORDER — SODIUM CHLORIDE 0.9 % IV SOLN
INTRAVENOUS | Status: AC
  Administered 2017-08-29: 03:00:00 via INTRAVENOUS

## 2017-08-29 MED ORDER — DIVALPROEX SODIUM ER 500 MG PO TB24
1500.0000 mg | ORAL_TABLET | Freq: Every day | ORAL | Status: DC
  Administered 2017-08-29 – 2017-09-03 (×6): 1500 mg via ORAL
  Filled 2017-08-29 (×6): qty 3

## 2017-08-29 MED ORDER — METOPROLOL SUCCINATE ER 25 MG PO TB24
12.5000 mg | ORAL_TABLET | Freq: Every day | ORAL | Status: DC
  Administered 2017-08-29 – 2017-09-01 (×3): 12.5 mg via ORAL
  Filled 2017-08-29 (×4): qty 1

## 2017-08-29 MED ORDER — VANCOMYCIN HCL IN DEXTROSE 1-5 GM/200ML-% IV SOLN
1000.0000 mg | INTRAVENOUS | Status: DC
  Administered 2017-08-29: 1000 mg via INTRAVENOUS
  Filled 2017-08-29: qty 200

## 2017-08-29 NOTE — Progress Notes (Addendum)
Pharmacy Antibiotic Note  Shaun Yoder is a 72 y.o. male admitted on 08/28/2017 with cellulitis.  Pharmacy has been consulted for vancomycin and zosyn dosing.  Plan: Vancomycin 1 Gm IV q24h for est AUC=468 Goal AUC =400-500 F/u scr/cultures/levels Zosyn 3.375 Gm IV q8h EI  Height: 5\' 1"  (154.9 cm) Weight: 229 lb 4.5 oz (104 kg) IBW/kg (Calculated) : 52.3  Temp (24hrs), Avg:100.6 F (38.1 C), Min:99.2 F (37.3 C), Max:102.4 F (39.1 C)  Recent Labs  Lab 08/28/17 1511 08/28/17 1525 08/28/17 1924  WBC 14.0*  --   --   CREATININE 0.71  --   --   LATICACIDVEN  --  1.60 1.36    Estimated Creatinine Clearance: 87.4 mL/min (by C-G formula based on SCr of 0.71 mg/dL).    Allergies  Allergen Reactions  . Leflunomide Other (See Comments)    diarrhea  . Morphine And Related Nausea And Vomiting  . Plaquenil [Hydroxychloroquine Sulfate]     rash    Antimicrobials this admission: 2/7 vancomycin >>  2/7 zosyn  >>   Dose adjustments this admission:   Microbiology results:  BCx:   UCx:    Sputum:   MRSA PCR:   Thank you for allowing pharmacy to be a part of this patient's care.  Dorrene German 08/29/2017 2:55 AM

## 2017-08-29 NOTE — Progress Notes (Signed)
  Echocardiogram 2D Echocardiogram has been performed.  Elianny Buxbaum T Corley Maffeo 08/29/2017, 2:07 PM

## 2017-08-29 NOTE — H&P (Addendum)
History and Physical    Shaun Yoder VPX:[id number] DOB: 01-Jan-1946 DOA: 08/28/2017  PCP: Colon Branch, MD   Patient coming from: Home  Chief Complaint: SOB, Leg swelling.  HPI: Shaun Yoder is a 72 y.o. male with medical history significant for schizoaffective, and bipolar disorder, rheumatoid arthritis, HLD, HTN, Left leg DVT, severe aortic stenosis who presented to the [facility] ED with complaints of difficulty breathing, feeling ill and leg swelling.  Patient reports shortness of breath started 5 days ago, present with exertion, no associated cough, no chest pain, endorses stable one pillow use, denies orthopnea.  Patient reports compliance with his Eliquis. Leg swelling started yesterday, worse on left leg, with redness, pain with palpation. Patient and family did not note fever or chills. Patient reports a wound on the medial and lower side of his left knee, sustained when he bruised his leg against a surface while using his motorized wheelchair.  He has pain going to [facility], initially improving without purulence, then yesterday started to weep clear fluids.  Denies other trauma to extremity.  Same extremity involved in DVT.  ED Course: Pressure up to 102.1 in ED, some soft blood pressure systolic 86, improved with 1 L IV fluids to 462 systolic, O2 sats greater than 92% on RA.  WBC elevated 14, lactic acid normal 1.6, left lower venous extremity ultrasound negative for DVT, CT left leg W contrast, no abscess or hematoma or fluid collection.  Started on IV vancomycin in the ED. Hospitalist was called to admit/transfer to [facility] for cellulitis.  Review of Systems: As per HPI otherwise 10 point review of systems negative.   Past Medical History:  Diagnosis Date  . Anemia   . Cancer ([place])    bladder  . Colitis   . Colon polyps   . Depression   . Diverticulosis   . DVT of deep femoral vein ([place])   . Gastritis   . GERD (gastroesophageal reflux disease)   .  History of rectal polyps   . Hyperlipidemia   . Hypertension   . Iron deficiency   . Low back pain   . OA (osteoarthritis)   . Osteonecrosis of shoulder region ([place])   . Schizoaffective disorder, bipolar type ([place])   . Sleep apnea   . Varicose vein     Past Surgical History:  Procedure Laterality Date  . BLADDER SURGERY    . TOTAL KNEE ARTHROPLASTY Bilateral   . VARICOSE VEIN SURGERY       reports that he quit smoking about 19 years ago. His smoking use included cigarettes. He has a 107.50 pack-year smoking history. he has never used smokeless tobacco. He reports that he drinks alcohol. He reports that he does not use drugs.  Allergies  Allergen Reactions  . Leflunomide Other (See Comments)    diarrhea  . Morphine And Related Nausea And Vomiting  . Plaquenil [Hydroxychloroquine Sulfate]     rash    Family History  Problem Relation Age of Onset  . Heart attack Mother   . Heart attack Father   . Stroke Father   . Rheumatologic disease Neg Hx   . Colon cancer Neg Hx   . Lung disease Neg Hx   . Prostate cancer Neg Hx     Prior to Admission medications   Medication Sig Start Date End Date Taking? Authorizing Provider  acetaminophen-codeine (TYLENOL #3) 300-30 MG tablet Take 1 tablet by mouth every 4 (four) hours as needed for  moderate pain.   Yes [provider]  apixaban (ELIQUIS) 5 MG TABS tablet Take 1 tablet (5 mg total) by mouth 2 (two) times daily. 03/25/17  Yes Paz, Alda Berthold, MD  ARIPiprazole (ABILIFY) 30 MG tablet Take 30 mg by mouth at bedtime.     Yes [provider]  aspirin EC 81 MG tablet Take 81 mg by mouth daily.   Yes [provider]  calcium-vitamin D (CALCIUM 500/D) 500-200 MG-UNIT tablet Take 1 tablet by mouth daily with breakfast.    Yes [provider]  divalproex (DEPAKOTE ER) 500 MG 24 hr tablet 3 tabs at bedtime   Yes [provider]  folic acid (FOLVITE) 1 MG tablet Take 1 mg by mouth daily.     Yes  [provider]  hydrocortisone cream 1 % Apply on affected areas on face and behind ears twice daily for 14 days. Patient taking differently: Apply on affected areas on face and behind ears twice daily as needed for rash 01/01/17  Yes Wendling, Crosby Oyster, DO  ketoconazole (NIZORAL) 2 % shampoo Lather and massage into scalp 2-3 times per week. Leave for 5 minutes before rinsing. 07/07/17  Yes Paz, Alda Berthold, MD  methotrexate (RHEUMATREX) 2.5 MG tablet Take 17.5 mg by mouth once a week. Reported on 12/06/2015   Yes [name], MD  metoprolol succinate (TOPROL-XL) 25 MG 24 hr tablet Take 12.5 mg by mouth daily.   Yes [provider]  mirabegron ER (MYRBETRIQ) 50 MG TB24 tablet Take 50 mg by mouth daily.   Yes [provider]  Omega-3 1000 MG CAPS Take 1 capsule by mouth daily.    Yes [provider]  omeprazole (PRILOSEC) 20 MG capsule Take 20 mg by mouth every morning.    Yes [provider]  oxybutynin (DITROPAN) 5 MG tablet Take 5 mg by mouth 2 (two) times daily.   Yes [provider]  predniSONE (DELTASONE) 5 MG tablet Take 5 mg by mouth daily with breakfast.   Yes [provider]  Tofacitinib Citrate (XELJANZ XR) 11 MG TB24 Take 1 tablet by mouth daily.   Yes [provider]  vitamin C (ASCORBIC ACID) 500 MG tablet Take 500 mg by mouth daily.   Yes [provider]  ziprasidone (GEODON) 80 MG capsule Take 80 mg by mouth daily.   Yes [provider]  gabapentin (NEURONTIN) 300 MG capsule Take 300 mg by mouth 2 (two) times daily.     [provider]  trimethoprim-polymyxin b (POLYTRIM) ophthalmic solution Place 1 drop into both eyes every 4 (four) hours. 01/01/17   [name], DO    Physical Exam: Vitals:   08/28/17 2200 08/28/17 2215 08/28/17 2343 08/29/17 0052  BP: 100/67 104/65 124/71   Pulse: 66 (!) 59 64   Resp: (!) 25 (!) 24 20   Temp:   99.3 F (37.4 C) 99.2 F (37.3 C)    TempSrc:   Oral Oral  SpO2: 98% 99% 98%   Weight:   104 kg (229 lb 4.5 oz)   Height:   5\' 1"  (1.549 m)     Constitutional: NAD, calm, comfortable Vitals:   08/28/17 2200 08/28/17 2215 08/28/17 2343 08/29/17 0052  BP: 100/67 104/65 124/71   Pulse: 66 (!) 59 64   Resp: (!) 25 (!) 24 20   Temp:   99.3 F (37.4 C) 99.2 F (37.3 C)  TempSrc:   Oral Oral  SpO2: 98% 99% 98%  Weight:   104 kg (229 lb 4.5 oz)   Height:   5\' 1"  (1.549 m)    Eyes: PERRL, lids and conjunctivae normal ENMT: Mucous membranes are dry.  Posterior pharynx clear of any exudate or lesions.  Neck: normal, supple, no masses, no thyromegaly Respiratory: clear to auscultation bilaterally, no wheezing, no crackles. Normal respiratory effort. No accessory muscle use.  Cardiovascular: Regular rate and rhythm, 3/6 systolic murmur, loudest RU sternal border, 2+ pedal pulses.  Abdomen: no tenderness, no masses palpated. No hepatosplenomegaly. Bowel sounds positive.  Musculoskeletal: no clubbing / cyanosis.  Good ROM, no contractures. Normal muscle tone.  tenderness erythema involving left lower extremity from ankle to below knee, what appears to be small pustules on medial side of left leg, or may be woody induration from chronic stasis changes, chronic stasis changes right lower extremity, Skin: no rashes, lesions, ulcers. No induration Neurologic: CN 2-12 grossly intact. Moving all extremities spontaneously  Psychiatric: Normal judgment and insight. Alert and oriented x 3. Normal mood.         Labs on Admission: I have personally reviewed following labs and imaging studies  CBC: Recent Labs  Lab 08/28/17 1511  WBC 14.0*  NEUTROABS 12.9*  HGB 13.2  HCT 39.3  MCV 100.0  PLT 878*   Basic Metabolic Panel: Recent Labs  Lab 08/28/17 1511  NA 139  K 4.3  CL 102  CO2 30  GLUCOSE 100*  BUN 25*  CREATININE 0.71  CALCIUM 8.7*   Liver Function Tests: Recent Labs  Lab 08/28/17 1511  AST 34  ALT 23   ALKPHOS 62  BILITOT 0.8  PROT 6.9  ALBUMIN 3.6   Coagulation Profile: Recent Labs  Lab 08/28/17 1511  INR 1.12   Urine analysis:    Component Value Date/Time   COLORURINE YELLOW 08/28/2017 [facility] 08/28/2017 1415   LABSPEC 1.015 08/28/2017 1415   PHURINE 8.5 (H) 08/28/2017 1415   GLUCOSEU NEGATIVE 08/28/2017 1415   GLUCOSEU NEGATIVE 04/03/2016 1453   HGBUR TRACE (A) 08/28/2017 1415   BILIRUBINUR NEGATIVE 08/28/2017 1415   BILIRUBINUR neg 04/03/2016 1549   KETONESUR NEGATIVE 08/28/2017 1415   PROTEINUR NEGATIVE 08/28/2017 1415   UROBILINOGEN 1.0 04/03/2016 1549   UROBILINOGEN 2.0 (A) 04/03/2016 1453   NITRITE NEGATIVE 08/28/2017 1415   LEUKOCYTESUR NEGATIVE 08/28/2017 1415    Radiological Exams on Admission: Dg Chest 2 View  Result Date: 08/28/2017 CLINICAL DATA:  Shortness of breath. History of urinary bladder carcinoma EXAM: CHEST  2 VIEW COMPARISON:  January 06, 2017 FINDINGS: There is bibasilar lung scarring. There is no edema or consolidation. The heart size and pulmonary vascular normal. There is aortic atherosclerosis. No adenopathy. There is advanced erosion of both proximal humeri with foci of calcification within each shoulder joint. IMPRESSION: Bibasilar lung scarring. No edema or consolidation. Stable cardiac silhouette. There is aortic atherosclerosis. No adenopathy. Fragmentation and advanced erosion of both proximal humeri noted. Aortic Atherosclerosis (ICD10-I70.0). Electronically Signed   By: Lowella Grip III M.D.   On: 08/28/2017 13:57   US Venous Img Lower Unilateral Left  Result Date: 08/28/2017 CLINICAL DATA:  72 year old male with left lower extremity discoloration and pain EXAM: LEFT LOWER EXTREMITY VENOUS DOPPLER ULTRASOUND TECHNIQUE: Gray-scale sonography with graded compression, as well as color Doppler and duplex ultrasound were performed to evaluate the lower extremity deep venous systems from the level of the common femoral vein  and including the common femoral, femoral, profunda femoral, popliteal and calf veins including  the posterior tibial, peroneal and gastrocnemius veins when visible. The superficial great saphenous vein was also interrogated. Spectral Doppler was utilized to evaluate flow at rest and with distal augmentation maneuvers in the common femoral, femoral and popliteal veins. COMPARISON:  None. FINDINGS: Contralateral Common Femoral Vein: Respiratory phasicity is normal and symmetric with the symptomatic side. No evidence of thrombus. Normal compressibility. Common Femoral Vein: No evidence of thrombus. Normal compressibility, respiratory phasicity and response to augmentation. Saphenofemoral Junction: No evidence of thrombus. Normal compressibility and flow on color Doppler imaging. Profunda Femoral Vein: No evidence of thrombus. Normal compressibility and flow on color Doppler imaging. Femoral Vein: No evidence of thrombus. Normal compressibility, respiratory phasicity and response to augmentation. Popliteal Vein: No evidence of thrombus. Normal compressibility, respiratory phasicity and response to augmentation. Calf Veins: No evidence of thrombus within the posterior tibial veins. The peroneal veins are not well seen. Superficial Great Saphenous Vein: No evidence of thrombus. Normal compressibility. Venous Reflux:  None. Other Findings: Superficial subcutaneous edema in the soft tissues of the left lower extremity. IMPRESSION: 1. No evidence of deep venous thrombosis to the level of the knee. Additionally, no evidence of deep venous thrombosis in the posterior tibial vein. The peroneal veins are not well seen secondary to lower extremity edema and patient discomfort. Electronically Signed   By: Jacqulynn Cadet M.D.   On: 08/28/2017 17:08   Ct Extremity Lower Left W Contrast  Result Date: 08/28/2017 CLINICAL DATA:  Severe shortness of breath worsening over past 2 days, LEFT leg swelling increased more than usual,  tremors, severe shortness of breath with minimal exertion, sore at knee being followed by wound care, extreme pain, fever of unknown origin EXAM: CT OF THE LOWER LEFT EXTREMITY WITH CONTRAST TECHNIQUE: Multidetector CT imaging of the lower left extremity was performed according to the standard protocol following intravenous contrast administration. Sagittal and coronal MPR images reconstructed from axial data set. COMPARISON:  None CONTRAST:  100 cc Isovue-300 IV FINDINGS: Bones/Joint/Cartilage Diffuse osseous demineralization. Advanced degenerative changes of LEFT hip joint. Components of a LEFT knee prosthesis are identified with a associated beam hardening artifacts and small joint effusion. Additional mild degenerative changes at the tibiotalar joint. No acute fracture, dislocation or bone destruction. Synovial thickening with probable minimal joint effusion at LEFT hip. Ligaments Suboptimally assessed by CT. Muscles and Tendons Scattered mild muscular atrophy. Marked diffuse atrophy of the semimembranosus. No definite intramuscular edema or focal fluid collection identified. Soft tissues Increased stool in rectum. Extensive atherosclerotic calcifications in the LEFT lower extremity. Deep venous system appears grossly patent. Scattered soft tissue edema throughout the LEFT lower extremity greatest at the lateral aspect of LEFT calf. Normal sized LEFT inguinal lymph nodes. Scattered subcutaneous venous varicosities at the medial LEFT leg above and below knee. No discrete abscess collection or focal fluid collection/hematoma identified. No evidence of soft tissue mass. IMPRESSION: Advanced degenerative changes of the LEFT hip joint with probable minimal joint effusion. Prior LEFT total knee arthroplasty with minimal joint effusion. Mild degenerative changes of the LEFT ankle. Scattered subcutaneous edema throughout the LEFT lower extremity without focal fluid collection to suggest abscess or hematoma. Atrophy of  the LEFT semimembranosus muscle. Electronically Signed   By: Lavonia Dana M.D.   On: 08/28/2017 19:33    EKG: Independently reviewed. RBBB- old, LAFB and specific T wave changes lateral leads.  Assessment/Plan Principal Problem:   Cellulitis Active Problems:   Aortic stenosis, severe   Sleep apnea   Schizoaffective disorder, bipolar type ([place])  Rheumatoid arthritis ([place])   Hyperlipidemia   Morbid obesity ([facility])   Hypertension   Left leg DVT ([facility])   Sepsis ([facility])   Cellulitis- with sepsis. Left leg swelling, erythema, leukocytosis, fever,.  No lactic acidosis.  IV vancomycin started in the ED. Patient septic and immunocompromised- on methotrexate, tofacitinib and prednisone, hence will broaden spectrum of coverage. ? mutiple Small pustules on side of leg, ?woody induration from stasis changes.  Patient also has a chronic wound, possible source of cellulitis. Ct neg for collection. Left [place] neg for DVT. -Continue IV vancomycin, will start IV Zosyn per pharmacy -Follow-up blood cultures drawn in ED - Gentle hydration Ns 75cc/hr x 12 hrs. - CBc a.m - BMP a.m  Shortness of breath -with exertion, likely related to severe aortic stenosis.  Compliant with Eliquis.  EKG nonspecific T wave changes, lateral leads, and LAFB. Chest xray-no infection. -Echocardiogram -Troponins x3  Severe Aortic stenosis-echo 12/2014- severe AS, at that time, was seen by Dr. Burt Knack, she did not want to move forward with treatment at that time, would call if symptoms worsened, for consideration for T AVR evaluation in the future - Echo  Rheumatoid arthritis-follows with Dr. Berna Bue. Reports being on chronic low-dose steroids 5 mg 2 1/2 months. -Hold home methotrexate and tofacitinib in setting of acute infection - Cont prednisone. Dose low, hence will not start stress dose steroids.   DVT prophylaxis: Eliquis  Code Status: Full  Family Communication: Spouse at  Bedside  Disposition Plan: 2-3 days Consults  called: None  Admission status: Inpt, tele    Bethena Roys MD [facility] Pager [phone]  If 6PM-4AM, please contact night-coverage [email or web address] Password [email or web address]  08/29/2017, 2:35 AM

## 2017-08-29 NOTE — Progress Notes (Addendum)
PHARMACY - PHYSICIAN COMMUNICATION CRITICAL VALUE ALERT - BLOOD CULTURE IDENTIFICATION (BCID)  Shaun Yoder is an 72 y.o. male who presented to [facility] on 08/28/2017 with a chief complaint of leg swelling and shortness of breath  Assessment:  Pseudomonas isolated in both sets of BCx with suspected source from cellulitis.  On immunosuppresssants  Name of physician (or Provider) Contacted: Dr. Nila Nephew  Current antibiotics: vancomycin and zosyn   Changes to prescribed antibiotics recommended:  Recommendations accepted by provider - change to cefepime 2gm q8h  Results for orders placed or performed during the hospital encounter of 08/28/17  Blood Culture ID Panel (Reflexed) (Collected: 08/28/2017  3:40 PM)  Result Value Ref Range   Enterococcus species NOT DETECTED NOT DETECTED   Vancomycin resistance NOT DETECTED NOT DETECTED   Listeria monocytogenes NOT DETECTED NOT DETECTED   Staphylococcus species NOT DETECTED NOT DETECTED   Staphylococcus aureus NOT DETECTED NOT DETECTED   Methicillin resistance NOT DETECTED NOT DETECTED   Streptococcus species NOT DETECTED NOT DETECTED   Streptococcus agalactiae NOT DETECTED NOT DETECTED   Streptococcus pneumoniae NOT DETECTED NOT DETECTED   Streptococcus pyogenes NOT DETECTED NOT DETECTED   Acinetobacter baumannii NOT DETECTED NOT DETECTED   Enterobacteriaceae species NOT DETECTED NOT DETECTED   Enterobacter cloacae complex NOT DETECTED NOT DETECTED   Escherichia coli NOT DETECTED NOT DETECTED   Klebsiella oxytoca NOT DETECTED NOT DETECTED   Klebsiella pneumoniae NOT DETECTED NOT DETECTED   Proteus species NOT DETECTED NOT DETECTED   Serratia marcescens NOT DETECTED NOT DETECTED   Carbapenem resistance NOT DETECTED NOT DETECTED   Haemophilus influenzae NOT DETECTED NOT DETECTED   Neisseria meningitidis NOT DETECTED NOT DETECTED   Pseudomonas aeruginosa DETECTED (A) NOT DETECTED   Candida albicans NOT DETECTED NOT DETECTED   Candida  glabrata NOT DETECTED NOT DETECTED   Candida krusei NOT DETECTED NOT DETECTED   Candida parapsilosis NOT DETECTED NOT DETECTED   Candida tropicalis NOT DETECTED NOT DETECTED   Doreene Eland, PharmD, BCPS.   Pager: [phone] 08/29/2017 5:18 PM

## 2017-08-29 NOTE — Progress Notes (Addendum)
PROGRESS NOTE    Patient: Shaun Yoder     PCP: Colon Branch, MD                    DOB: [birth year]            DOA: 08/28/2017 NWG:[id number]             DOS: 08/29/2017, 2:13 PM   LOS: 1 day   Date of Service: The patient was seen and examined on 08/29/2017  Subjective:   Patient was seen and examined, stable in bed, mildly somnolent, easily arousable, afebrile, Mildly hypotensive, mild tachycardia,. Per nursing staff has been stable ----------------------------------------------------------------------------------------------------------------------  Brief Narrative:  Shaun Yoder is a 72 y.o. male with medical history significant for schizoaffective, and bipolar disorder, rheumatoid arthritis, HLD, HTN, Left leg DVT, severe aortic stenosis who presented to the [facility] ED with complaints of difficulty breathing, feeling ill and leg swelling.  Patient reports shortness of breath started 5 days ago, present with exertion, no associated cough, no chest pain, endorses stable one pillow use, denies orthopnea.  Patient reports compliance with his Eliquis. Leg swelling started yesterday, worse on left leg, with redness, pain with palpation. Patient and family did not note fever or chills. Patient reports a wound on the medial and lower side of his left knee, sustained when he bruised his leg against a surface while using his motorized wheelchair.  He has pain going to [facility], initially improving without purulence, then yesterday started to weep clear fluids.  Denies other trauma to extremity.  Same extremity involved in DVT. blood pressure systolic 86, improved with 1 L IV fluids to 578 systolic, O2 sats greater than 92% on RA.  WBC elevated 14, lactic acid normal 1.6, left lower venous extremity ultrasound negative for DVT, CT left leg W contrast, no abscess or hematoma or fluid collection.  Started on IV vancomycin    --------------------------------------------------------------------------------------------------------------------------------------------- Principal Problem:   Cellulitis Active Problems:   Aortic stenosis, severe   Sleep apnea   Schizoaffective disorder, bipolar type (HCC)   Rheumatoid arthritis ([place])   Hyperlipidemia   Morbid obesity ([place])   Hypertension   Left leg DVT (HCC)   Sepsis ([place])   Assessment & Plan:   Cellulitis / sepsis. Left leg swelling, erythema, leukocytosis, fever,.  No lactic acidosis.  on IV Zosyn/vancomycin. Afebrile, now normotensive, lactic acid 1.6 improved leukocytosis from 14-10.4 Patient septic and immunocompromised- on methotrexate, tofacitinib and prednisone mutiple Small pustules on side of leg, induration from stasis changes.   Patient also has a chronic wound, possible source of cellulitis.  Ct neg for collection. Left [place] neg for DVT. Will follow with blood cultures, Continue gentle hydration,  Shortness of breath - with exertion, likely related to severe aortic stenosis.  Compliant with Eliquis.  EKG nonspecific T wave changes, lateral leads, and LAFB. Chest xray-no infection. -Echocardiogram -Troponins x3 -has been flat at 0.03 X 2, no chest pain or EKG changes  Severe Aortic stenosis- echo 12/2014- severe AS, at that time, was seen by Dr. Burt Knack, she did not want to move forward with treatment at that time, would call if symptoms worsened, for consideration for T AVR evaluation in the future - Echo - pending   Rheumatoid arthritis-follows with Dr. Berna Bue. Reports being on chronic low-dose steroids 5 mg 2 1/2 months. -Hold home methotrexate and tofacitinib in setting of acute infection - Cont prednisone. Dose low, hence will not start stress  dose steroids.   DVT prophylaxis: Eliquis  Code Status: Full Code Family Communication: Spouse at  Bedside  Disposition Plan: 2-3 days Consults called: None  Admission status: Inpt,  tele        Procedures:   Echo   Antimicrobials:  Anti-infectives (From admission, onward)   Start     Dose/Rate Route Frequency Ordered Stop   08/29/17 0800  vancomycin (VANCOCIN) IVPB 1000 mg/200 mL premix     1,000 mg 200 mL/hr over 60 Minutes Intravenous Every 24 hours 08/29/17 0258     08/29/17 0430  piperacillin-tazobactam (ZOSYN) IVPB 3.375 g     3.375 g 12.5 mL/hr over 240 Minutes Intravenous Every 8 hours 08/29/17 0422     08/28/17 1515  vancomycin (VANCOCIN) IVPB 1000 mg/200 mL premix     1,000 mg 200 mL/hr over 60 Minutes Intravenous  Once 08/28/17 1507 08/28/17 1647       Objective: Vitals:   08/28/17 2215 08/28/17 2343 08/29/17 0052 08/29/17 0521  BP: 104/65 124/71  (!) 100/55  Pulse: (!) 59 64  93  Resp: (!) 24 20  14   Temp:  99.3 F (37.4 C) 99.2 F (37.3 C) 98.8 F (37.1 C)  TempSrc:  Oral Oral Oral  SpO2: 99% 98%  91%  Weight:  104 kg (229 lb 4.5 oz)    Height:  5\' 1"  (1.549 m)      Intake/Output Summary (Last 24 hours) at 08/29/2017 1413 Last data filed at 08/29/2017 0600 Gross per 24 hour  Intake 1473.75 ml  Output -  Net 1473.75 ml   Filed Weights   08/28/17 2343  Weight: 104 kg (229 lb 4.5 oz)    Examination:  General exam: Appears calm and comfortable  HEENT: WNLs Respiratory system: Clear to auscultation. Respiratory effort normal. Cardiovascular system: S1 & S2 heard, RRR.  Systolic 3 out of 6 crescendo decrescendo murmurs, NO rubs, gallops or clicks.  Gastrointestinal system: Abd. nondistended, soft and nontender. No organomegaly or masses felt. Normal bowel sounds heard. Central nervous system: Alert and oriented. No focal neurological deficits. Extremities: As below, positive bilateral pulses faint. Skin: L Lower extremity erythema edema, left ankle below the knee extensive erythema, edema pustules on the medial left ankle area induration from chronic stasis, right lower extremity stasis, surgical scar, mild edema negative for  erythema, discoloration Data Reviewed: I have personally reviewed following labs and imaging studies  CBC: Recent Labs  Lab 08/28/17 1511 08/29/17 0440  WBC 14.0* 10.4  NEUTROABS 12.9*  --   HGB 13.2 11.5*  HCT 39.3 34.0*  MCV 100.0 98.6  PLT 121* 175*   Basic Metabolic Panel: Recent Labs  Lab 08/28/17 1511 08/29/17 0440  NA 139 136  K 4.3 3.8  CL 102 104  CO2 30 28  GLUCOSE 100* 99  BUN 25* 21*  CREATININE 0.71 0.58*  CALCIUM 8.7* 8.0*   GFR: Estimated Creatinine Clearance: 87.4 mL/min (A) (by C-G formula based on SCr of 0.58 mg/dL (L)). Liver Function Tests: Recent Labs  Lab 08/28/17 1511  AST 34  ALT 23  ALKPHOS 62  BILITOT 0.8  PROT 6.9  ALBUMIN 3.6   No results for input(s): LIPASE, AMYLASE in the last 168 hours. No results for input(s): AMMONIA in the last 168 hours. Coagulation Profile: Recent Labs  Lab 08/28/17 1511  INR 1.12   Cardiac Enzymes: Recent Labs  Lab 08/29/17 0440 08/29/17 1001  TROPONINI 0.03* 0.03*    Recent Labs  Lab 08/28/17 1525 08/28/17  1924  LATICACIDVEN 1.60 1.36    Recent Results (from the past 240 hour(s))  Culture, blood (Routine x 2)     Status: None (Preliminary result)   Collection Time: 08/28/17  3:15 PM  Result Value Ref Range Status   Specimen Description   Final    BLOOD BLOOD LEFT ARM Performed at [facility], [address] [place], [place] [address]    Special Requests   Final    BOTTLES DRAWN AEROBIC AND ANAEROBIC Blood Culture adequate volume Performed at [facility], [address] [place], [place] [address]    Culture   Final    NO GROWTH < 24 HOURS Performed at [facility], [place] [address] [place], [place] [address]    Report Status PENDING  Incomplete  Culture, blood (Routine x 2)     Status: None (Preliminary result)   Collection Time: 08/28/17  3:40 PM  Result Value Ref Range Status   Specimen Description   Final    BLOOD RIGHT ANTECUBITAL Performed  at [facility], [address] [place], [place] [address]    Special Requests   Final    BOTTLES DRAWN AEROBIC AND ANAEROBIC Blood Culture adequate volume Performed at [facility], [address] [place], [place] [address]    Culture   Final    NO GROWTH < 24 HOURS Performed at [facility], [place] [address], [place], [place] [address]    Report Status PENDING  Incomplete      Radiology Studies: Dg Chest 2 View  Result Date: 08/28/2017 CLINICAL DATA:  Shortness of breath. History of urinary bladder carcinoma EXAM: CHEST  2 VIEW COMPARISON:  January 06, 2017 FINDINGS: There is bibasilar lung scarring. There is no edema or consolidation. The heart size and pulmonary vascular normal. There is aortic atherosclerosis. No adenopathy. There is advanced erosion of both proximal humeri with foci of calcification within each shoulder joint. IMPRESSION: Bibasilar lung scarring. No edema or consolidation. Stable cardiac silhouette. There is aortic atherosclerosis. No adenopathy. Fragmentation and advanced erosion of both proximal humeri noted. Aortic Atherosclerosis (ICD10-I70.0). Electronically Signed   By: Lowella Grip III M.D.   On: 08/28/2017 13:57   US Venous Img Lower Unilateral Left  Result Date: 08/28/2017 CLINICAL DATA:  72 year old male with left lower extremity discoloration and pain EXAM: LEFT LOWER EXTREMITY VENOUS DOPPLER ULTRASOUND TECHNIQUE: Gray-scale sonography with graded compression, as well as color Doppler and duplex ultrasound were performed to evaluate the lower extremity deep venous systems from the level of the common femoral vein and including the common femoral, femoral, profunda femoral, popliteal and calf veins including the posterior tibial, peroneal and gastrocnemius veins when visible. The superficial great saphenous vein was also interrogated. Spectral Doppler was utilized to evaluate flow at rest and with distal augmentation maneuvers in the  common femoral, femoral and popliteal veins. COMPARISON:  None. FINDINGS: Contralateral Common Femoral Vein: Respiratory phasicity is normal and symmetric with the symptomatic side. No evidence of thrombus. Normal compressibility. Common Femoral Vein: No evidence of thrombus. Normal compressibility, respiratory phasicity and response to augmentation. Saphenofemoral Junction: No evidence of thrombus. Normal compressibility and flow on color Doppler imaging. Profunda Femoral Vein: No evidence of thrombus. Normal compressibility and flow on color Doppler imaging. Femoral Vein: No evidence of thrombus. Normal compressibility, respiratory phasicity and response to augmentation. Popliteal Vein: No evidence of thrombus. Normal compressibility, respiratory phasicity and response to augmentation.  Calf Veins: No evidence of thrombus within the posterior tibial veins. The peroneal veins are not well seen. Superficial Great Saphenous Vein: No evidence of thrombus. Normal compressibility. Venous Reflux:  None. Other Findings: Superficial subcutaneous edema in the soft tissues of the left lower extremity. IMPRESSION: 1. No evidence of deep venous thrombosis to the level of the knee. Additionally, no evidence of deep venous thrombosis in the posterior tibial vein. The peroneal veins are not well seen secondary to lower extremity edema and patient discomfort. Electronically Signed   By: Jacqulynn Cadet M.D.   On: 08/28/2017 17:08   Ct Extremity Lower Left W Contrast  Result Date: 08/28/2017 CLINICAL DATA:  Severe shortness of breath worsening over past 2 days, LEFT leg swelling increased more than usual, tremors, severe shortness of breath with minimal exertion, sore at knee being followed by wound care, extreme pain, fever of unknown origin EXAM: CT OF THE LOWER LEFT EXTREMITY WITH CONTRAST TECHNIQUE: Multidetector CT imaging of the lower left extremity was performed according to the standard protocol following intravenous  contrast administration. Sagittal and coronal MPR images reconstructed from axial data set. COMPARISON:  None CONTRAST:  100 cc Isovue-300 IV FINDINGS: Bones/Joint/Cartilage Diffuse osseous demineralization. Advanced degenerative changes of LEFT hip joint. Components of a LEFT knee prosthesis are identified with a associated beam hardening artifacts and small joint effusion. Additional mild degenerative changes at the tibiotalar joint. No acute fracture, dislocation or bone destruction. Synovial thickening with probable minimal joint effusion at LEFT hip. Ligaments Suboptimally assessed by CT. Muscles and Tendons Scattered mild muscular atrophy. Marked diffuse atrophy of the semimembranosus. No definite intramuscular edema or focal fluid collection identified. Soft tissues Increased stool in rectum. Extensive atherosclerotic calcifications in the LEFT lower extremity. Deep venous system appears grossly patent. Scattered soft tissue edema throughout the LEFT lower extremity greatest at the lateral aspect of LEFT calf. Normal sized LEFT inguinal lymph nodes. Scattered subcutaneous venous varicosities at the medial LEFT leg above and below knee. No discrete abscess collection or focal fluid collection/hematoma identified. No evidence of soft tissue mass. IMPRESSION: Advanced degenerative changes of the LEFT hip joint with probable minimal joint effusion. Prior LEFT total knee arthroplasty with minimal joint effusion. Mild degenerative changes of the LEFT ankle. Scattered subcutaneous edema throughout the LEFT lower extremity without focal fluid collection to suggest abscess or hematoma. Atrophy of the LEFT semimembranosus muscle. Electronically Signed   By: Lavonia Dana M.D.   On: 08/28/2017 19:33    Scheduled Meds: . apixaban  5 mg Oral BID  . divalproex  1,500 mg Oral QHS  . metoprolol succinate  12.5 mg Oral Daily  . mirabegron ER  50 mg Oral Daily  . predniSONE  5 mg Oral Q breakfast  . ziprasidone  80 mg  Oral Daily   Continuous Infusions: . sodium chloride 75 mL/hr at 08/29/17 0301  . piperacillin-tazobactam (ZOSYN)  IV Stopped (08/29/17 0900)  . vancomycin Stopped (08/29/17 0954)    Extended prolonged visit time spent: >35 minutes (including reviewing records on electronic medical records)  Deatra James, MD [facility],  Pager [phone]  If 7PM-7AM, please contact night-coverage [email or web address]   Password [email or web address]  08/29/2017, 2:13 PM

## 2017-08-29 NOTE — Care Management Note (Signed)
Case Management Note  Patient Details  Name: JOCK MAHON MRN: [id number] Date of Birth: 02/18/1946  Subjective/Objective:                  sepsis  Action/Plan: Date: August 29, 2017 Velva Harman, BSN, [place], [place] [phone] Chart and notes review for patient progress and needs. Will follow for case management and discharge needs. No cm or discharge needs present at time of this review. Next review date: [id number]  Expected Discharge Date:                  Expected Discharge Plan:  Home/Self Care  In-House Referral:     Discharge planning Services  CM Consult  Post Acute Care Choice:    Choice offered to:     DME Arranged:    DME Agency:     HH Arranged:    HH Agency:     Status of Service:  In process, will continue to follow  If discussed at Long Length of Stay Meetings, dates discussed:    Additional Comments:  Leeroy Cha, RN 08/29/2017, 8:46 AM

## 2017-08-29 NOTE — Telephone Encounter (Signed)
Pt was admitted, thx

## 2017-08-30 DIAGNOSIS — I350 Nonrheumatic aortic (valve) stenosis: Secondary | ICD-10-CM

## 2017-08-30 DIAGNOSIS — L03032 Cellulitis of left toe: Secondary | ICD-10-CM

## 2017-08-30 LAB — BASIC METABOLIC PANEL
Anion gap: 4 — ABNORMAL LOW (ref 5–15)
BUN: 19 mg/dL (ref 6–20)
CO2: 29 mmol/L (ref 22–32)
Calcium: 8.2 mg/dL — ABNORMAL LOW (ref 8.9–10.3)
Chloride: 103 mmol/L (ref 101–111)
Creatinine, Ser: 0.58 mg/dL — ABNORMAL LOW (ref 0.61–1.24)
GFR calc Af Amer: >60 mL/min (ref >60)
Glucose, Bld: 94 mg/dL (ref 65–99)
POTASSIUM: 4.1 mmol/L (ref 3.5–5.1)
SODIUM: 136 mmol/L (ref 135–145)

## 2017-08-30 LAB — CBC
HCT: 34.8 % — ABNORMAL LOW (ref 39.0–52.0)
Hemoglobin: 11.9 g/dL — ABNORMAL LOW (ref 13.0–17.0)
MCH: 33.1 pg (ref 26.0–34.0)
MCHC: 34.2 g/dL (ref 30.0–36.0)
MCV: 96.9 fL (ref 78.0–100.0)
PLATELETS: 103 10*3/uL — AB (ref 150–400)
RBC: 3.59 MIL/uL — AB (ref 4.22–5.81)
RDW: 14.5 % (ref 11.5–15.5)
WBC: 6.6 10*3/uL (ref 4.0–10.5)

## 2017-08-30 NOTE — Progress Notes (Signed)
PROGRESS NOTE    Patient: Shaun Yoder     PCP: Colon Branch, MD                    DOB: 02/07/1946            DOA: 08/28/2017 [id number]             DOS: 08/30/2017, 6:03 PM   LOS: 2 days   Date of Service: The patient was seen and examined on 08/30/2017  Subjective:   Patient was seen and examined, no new complaints.  Patient has had no fever or chills.  Still has some 8 out of 10 pain in his leg and multiple other locations including shoulders. ----------------------------------------------------------------------------------------------------------------------  Brief Narrative:  Shaun Yoder is a 72 y.o. male with medical history significant for schizoaffective, and bipolar disorder, rheumatoid arthritis, HLD, HTN, Left leg DVT, severe aortic stenosis who presented to the [facility] ED with complaints of difficulty breathing, feeling ill and leg swelling.  Patient reports shortness of breath started 5 days ago, present with exertion, no associated cough, no chest pain, endorses stable one pillow use, denies orthopnea.  Patient reports compliance with his Eliquis. Leg swelling started yesterday, worse on left leg, with redness, pain with palpation. Patient and family did not note fever or chills. Patient reports a wound on the medial and lower side of his left knee, sustained when he bruised his leg against a surface while using his motorized wheelchair.  He has pain going to [place] [facility], initially improving without purulence, then yesterday started to weep clear fluids.  Denies other trauma to extremity.  Same extremity involved in DVT. blood pressure systolic 86, improved with 1 L IV fluids to 622 systolic, O2 sats greater than 92% on RA.  WBC elevated 14, lactic acid normal 1.6, left lower venous extremity ultrasound negative for DVT, CT left leg W contrast, no abscess or hematoma or fluid collection.  Started on IV vancomycin    --------------------------------------------------------------------------------------------------------------------------------------------- Principal Problem:   Cellulitis Active Problems:   Aortic stenosis, severe   Sleep apnea   Schizoaffective disorder, bipolar type (HCC)   Rheumatoid arthritis ([place])   Hyperlipidemia   Morbid obesity ([place])   Hypertension   Left leg DVT (HCC)   Sepsis ([place])   Assessment & Plan:   Cellulitis / sepsis. Left leg better and dressed.  Redness has decreased.  Has chronic stasis dermatitis.  On IV antibiotics.  No abscess on CT and no DVT.  Culture still pending  Shortness of breath - with exertion, likely related to severe aortic stenosis.  Compliant with Eliquis.  Continue oxygenation as well as monitoring  Severe Aortic stenosis- Asymptomatic at the moment.  Stable. echo 12/2014- severe AS, at that time, was seen by Dr. Burt Knack, she did not want to move forward with treatment at that time, would call if symptoms worsened, for consideration for T AVR evaluation in the future - Echo - pending   Rheumatoid arthritis-follows with Dr. Berna Bue. Reports being on chronic low-dose steroids 5 mg 2 1/2 months. -Holding home methotrexate and tofacitinib in setting of acute infection - Cont prednisone. Dose low, hence will not start stress dose steroids. -follow-up with Dr. Lenna Gilford at discharge   DVT prophylaxis: Eliquis  Code Status: Full Code Family Communication: Spouse at  Bedside  Disposition Plan: 1-2 days Consults called: None  Admission status: Inpt, tele        Procedures:   Echo  Antimicrobials:  Anti-infectives (From admission, onward)   Start     Dose/Rate Route Frequency Ordered Stop   08/29/17 2200  ceFEPIme (MAXIPIME) 2 g in dextrose 5 % 50 mL IVPB     2 g 100 mL/hr over 30 Minutes Intravenous Every 8 hours 08/29/17 1724     08/29/17 0800  vancomycin (VANCOCIN) IVPB 1000 mg/200 mL premix  Status:  Discontinued      1,000 mg 200 mL/hr over 60 Minutes Intravenous Every 24 hours 08/29/17 0258 08/29/17 1724   08/29/17 0430  piperacillin-tazobactam (ZOSYN) IVPB 3.375 g  Status:  Discontinued     3.375 g 12.5 mL/hr over 240 Minutes Intravenous Every 8 hours 08/29/17 0422 08/29/17 1724   08/28/17 1515  vancomycin (VANCOCIN) IVPB 1000 mg/200 mL premix     1,000 mg 200 mL/hr over 60 Minutes Intravenous  Once 08/28/17 1507 08/28/17 1647       Objective: Vitals:   08/30/17 1019 08/30/17 1331 08/30/17 1405 08/30/17 1613  BP: 134/69 (!) 146/64  129/73  Pulse: (!) 55 64  60  Resp:  18 20 20   Temp:  98.1 F (36.7 C)  98.3 F (36.8 C)  TempSrc:  Oral  Oral  SpO2:  99%  94%  Weight:      Height:        Intake/Output Summary (Last 24 hours) at 08/30/2017 1803 Last data filed at 08/30/2017 1737 Gross per 24 hour  Intake 120 ml  Output 2420 ml  Net -2300 ml   Filed Weights   08/28/17 2343  Weight: 104 kg (229 lb 4.5 oz)    Examination:  General exam: Appears calm and comfortable  HEENT: WNLs Respiratory system: Clear to auscultation. Respiratory effort normal. Cardiovascular system: S1 & S2 heard, RRR.  Systolic 3 out of 6 crescendo decrescendo murmurs, NO rubs, gallops or clicks.  Gastrointestinal system: Abd. nondistended, soft and nontender. No organomegaly or masses felt. Normal bowel sounds heard. Central nervous system: Alert and oriented. No focal neurological deficits. Extremities: As below, positive bilateral pulses faint. Skin: L Lower extremity erythema edema, left ankle below the knee extensive erythema, edema pustules on the medial left ankle area induration from chronic stasis, right lower extremity stasis, surgical scar, mild edema negative for erythema, discoloration Data Reviewed: I have personally reviewed following labs and imaging studies  CBC: Recent Labs  Lab 08/28/17 1511 08/29/17 0440 08/30/17 0652  WBC 14.0* 10.4 6.6  NEUTROABS 12.9*  --   --   HGB 13.2 11.5* 11.9*    HCT 39.3 34.0* 34.8*  MCV 100.0 98.6 96.9  PLT 121* 111* 283*   Basic Metabolic Panel: Recent Labs  Lab 08/28/17 1511 08/29/17 0440 08/30/17 0652  NA 139 136 136  K 4.3 3.8 4.1  CL 102 104 103  CO2 30 28 29   GLUCOSE 100* 99 94  BUN 25* 21* 19  CREATININE 0.71 0.58* 0.58*  CALCIUM 8.7* 8.0* 8.2*   GFR: Estimated Creatinine Clearance: 87.4 mL/min (A) (by C-G formula based on SCr of 0.58 mg/dL (L)). Liver Function Tests: Recent Labs  Lab 08/28/17 1511  AST 34  ALT 23  ALKPHOS 62  BILITOT 0.8  PROT 6.9  ALBUMIN 3.6   No results for input(s): LIPASE, AMYLASE in the last 168 hours. No results for input(s): AMMONIA in the last 168 hours. Coagulation Profile: Recent Labs  Lab 08/28/17 1511  INR 1.12   Cardiac Enzymes: Recent Labs  Lab 08/29/17 0440 08/29/17 1001 08/29/17 1602  TROPONINI 0.03*  0.03* <0.03    Recent Labs  Lab 08/28/17 1525 08/28/17 1924  LATICACIDVEN 1.60 1.36    Recent Results (from the past 240 hour(s))  Culture, blood (Routine x 2)     Status: Abnormal (Preliminary result)   Collection Time: 08/28/17  3:15 PM  Result Value Ref Range Status   Specimen Description   Final    BLOOD BLOOD LEFT ARM Performed at [facility], [address] [place], [place] [address]    Special Requests   Final    BOTTLES DRAWN AEROBIC AND ANAEROBIC Blood Culture adequate volume Performed at [facility], [address] [place], [place] [address]    Culture  Setup Time   Final    GRAM NEGATIVE RODS AEROBIC BOTTLE ONLY CRITICAL VALUE NOTED.  VALUE IS CONSISTENT WITH PREVIOUSLY REPORTED AND CALLED VALUE. Performed at [facility], [place] [address], [place], [place] [address]    Culture PSEUDOMONAS AERUGINOSA (A)  Final   Report Status PENDING  Incomplete  Culture, blood (Routine x 2)     Status: Abnormal (Preliminary result)   Collection Time: 08/28/17  3:40 PM  Result Value Ref Range Status   Specimen Description   Final     BLOOD RIGHT ANTECUBITAL Performed at [facility], [address] [place], [place] [address]    Special Requests   Final    BOTTLES DRAWN AEROBIC AND ANAEROBIC Blood Culture adequate volume Performed at [facility], [address] [place], [place] [address]    Culture  Setup Time   Final    GRAM NEGATIVE RODS AEROBIC BOTTLE ONLY CRITICAL RESULT CALLED TO, READ BACK BY AND VERIFIED WITH: T. Green Pharm.D. 16:45 08/29/17 ([email or web address])    Culture (A)  Final    PSEUDOMONAS AERUGINOSA SUSCEPTIBILITIES TO FOLLOW Performed at [facility], [place] [address], [place],  [address]    Report Status PENDING  Incomplete  Blood Culture ID Panel (Reflexed)     Status: Abnormal   Collection Time: 08/28/17  3:40 PM  Result Value Ref Range Status   Enterococcus species NOT DETECTED NOT DETECTED Final   Vancomycin resistance NOT DETECTED NOT DETECTED Final   Listeria monocytogenes NOT DETECTED NOT DETECTED Final   Staphylococcus species NOT DETECTED NOT DETECTED Final   Staphylococcus aureus NOT DETECTED NOT DETECTED Final   Methicillin resistance NOT DETECTED NOT DETECTED Final   Streptococcus species NOT DETECTED NOT DETECTED Final   Streptococcus agalactiae NOT DETECTED NOT DETECTED Final   Streptococcus pneumoniae NOT DETECTED NOT DETECTED Final   Streptococcus pyogenes NOT DETECTED NOT DETECTED Final   Acinetobacter baumannii NOT DETECTED NOT DETECTED Final   Enterobacteriaceae species NOT DETECTED NOT DETECTED Final   Enterobacter cloacae complex NOT DETECTED NOT DETECTED Final   Escherichia coli NOT DETECTED NOT DETECTED Final   Klebsiella oxytoca NOT DETECTED NOT DETECTED Final   Klebsiella pneumoniae NOT DETECTED NOT DETECTED Final   Proteus species NOT DETECTED NOT DETECTED Final   Serratia marcescens NOT DETECTED NOT DETECTED Final   Carbapenem resistance NOT DETECTED NOT DETECTED Final   Haemophilus influenzae NOT DETECTED NOT DETECTED Final    Neisseria meningitidis NOT DETECTED NOT DETECTED Final   Pseudomonas aeruginosa DETECTED (A) NOT DETECTED Final    Comment: CRITICAL RESULT CALLED TO, READ BACK BY AND VERIFIED WITH: T. Green Pharm.D. 16:45 08/29/17 ([email or web address])    Candida albicans NOT DETECTED NOT DETECTED Final   Candida glabrata NOT  DETECTED NOT DETECTED Final   Candida krusei NOT DETECTED NOT DETECTED Final   Candida parapsilosis NOT DETECTED NOT DETECTED Final   Candida tropicalis NOT DETECTED NOT DETECTED Final    Comment: Performed at [facility], [place] [address], [place], [place] [address]      Radiology Studies: Ct Extremity Lower Left W Contrast  Result Date: 08/28/2017 CLINICAL DATA:  Severe shortness of breath worsening over past 2 days, LEFT leg swelling increased more than usual, tremors, severe shortness of breath with minimal exertion, sore at knee being followed by wound care, extreme pain, fever of unknown origin EXAM: CT OF THE LOWER LEFT EXTREMITY WITH CONTRAST TECHNIQUE: Multidetector CT imaging of the lower left extremity was performed according to the standard protocol following intravenous contrast administration. Sagittal and coronal MPR images reconstructed from axial data set. COMPARISON:  None CONTRAST:  100 cc Isovue-300 IV FINDINGS: Bones/Joint/Cartilage Diffuse osseous demineralization. Advanced degenerative changes of LEFT hip joint. Components of a LEFT knee prosthesis are identified with a associated beam hardening artifacts and small joint effusion. Additional mild degenerative changes at the tibiotalar joint. No acute fracture, dislocation or bone destruction. Synovial thickening with probable minimal joint effusion at LEFT hip. Ligaments Suboptimally assessed by CT. Muscles and Tendons Scattered mild muscular atrophy. Marked diffuse atrophy of the semimembranosus. No definite intramuscular edema or focal fluid collection identified. Soft tissues Increased stool in rectum. Extensive  atherosclerotic calcifications in the LEFT lower extremity. Deep venous system appears grossly patent. Scattered soft tissue edema throughout the LEFT lower extremity greatest at the lateral aspect of LEFT calf. Normal sized LEFT inguinal lymph nodes. Scattered subcutaneous venous varicosities at the medial LEFT leg above and below knee. No discrete abscess collection or focal fluid collection/hematoma identified. No evidence of soft tissue mass. IMPRESSION: Advanced degenerative changes of the LEFT hip joint with probable minimal joint effusion. Prior LEFT total knee arthroplasty with minimal joint effusion. Mild degenerative changes of the LEFT ankle. Scattered subcutaneous edema throughout the LEFT lower extremity without focal fluid collection to suggest abscess or hematoma. Atrophy of the LEFT semimembranosus muscle. Electronically Signed   By: Lavonia Dana M.D.   On: 08/28/2017 19:33    Scheduled Meds: . apixaban  5 mg Oral BID  . divalproex  1,500 mg Oral QHS  . metoprolol succinate  12.5 mg Oral Daily  . mirabegron ER  50 mg Oral Daily  . predniSONE  5 mg Oral Q breakfast  . ziprasidone  80 mg Oral Daily   Continuous Infusions: . ceFEPime (MAXIPIME) IV Stopped (08/30/17 1409)    Extended prolonged visit time spent: >35 minutes (including reviewing records on electronic medical records)  Barbette [place], MD [facility],  Pager [phone]  If 7PM-7AM, please contact night-coverage [email or web address]   Password [facility]  08/30/2017, 6:03 PM

## 2017-08-31 DIAGNOSIS — R7881 Bacteremia: Secondary | ICD-10-CM

## 2017-08-31 LAB — CULTURE, BLOOD (ROUTINE X 2)
SPECIAL REQUESTS: ADEQUATE
SPECIAL REQUESTS: ADEQUATE

## 2017-08-31 LAB — BASIC METABOLIC PANEL
Anion gap: 6 (ref 5–15)
BUN: 17 mg/dL (ref 6–20)
CALCIUM: 8.5 mg/dL — AB (ref 8.9–10.3)
CO2: 29 mmol/L (ref 22–32)
CREATININE: 0.53 mg/dL — AB (ref 0.61–1.24)
Chloride: 103 mmol/L (ref 101–111)
GFR calc non Af Amer: >60 mL/min (ref >60)
GLUCOSE: 94 mg/dL (ref 65–99)
Potassium: 4.3 mmol/L (ref 3.5–5.1)
Sodium: 138 mmol/L (ref 135–145)

## 2017-08-31 LAB — CBC
HEMATOCRIT: 35.4 % — AB (ref 39.0–52.0)
Hemoglobin: 12 g/dL — ABNORMAL LOW (ref 13.0–17.0)
MCH: 32.9 pg (ref 26.0–34.0)
MCHC: 33.9 g/dL (ref 30.0–36.0)
MCV: 97 fL (ref 78.0–100.0)
Platelets: 102 10*3/uL — ABNORMAL LOW (ref 150–400)
RBC: 3.65 MIL/uL — ABNORMAL LOW (ref 4.22–5.81)
RDW: 14.3 % (ref 11.5–15.5)
WBC: 5.8 10*3/uL (ref 4.0–10.5)

## 2017-08-31 MED ORDER — CIPROFLOXACIN IN D5W 400 MG/200ML IV SOLN
400.0000 mg | Freq: Two times a day (BID) | INTRAVENOUS | Status: DC
Start: 2017-08-31 — End: 2017-09-04
  Administered 2017-08-31 – 2017-09-04 (×8): 400 mg via INTRAVENOUS
  Filled 2017-08-31 (×8): qty 200

## 2017-08-31 MED ORDER — LIP MEDEX EX OINT
TOPICAL_OINTMENT | CUTANEOUS | Status: AC
  Administered 2017-08-31: 21:00:00
  Filled 2017-08-31: qty 7

## 2017-08-31 MED ORDER — ARIPIPRAZOLE 10 MG PO TABS
30.0000 mg | ORAL_TABLET | Freq: Every day | ORAL | Status: DC
  Administered 2017-08-31 – 2017-09-03 (×4): 30 mg via ORAL
  Filled 2017-08-31 (×4): qty 3

## 2017-08-31 NOTE — Plan of Care (Signed)
  Elimination: Will not experience complications related to bowel motility 08/31/2017 2059 - Progressing by Mickie Kay, RN   Elimination: Will not experience complications related to urinary retention 08/31/2017 2059 - Progressing by Mickie Kay, RN

## 2017-08-31 NOTE — Progress Notes (Addendum)
PROGRESS NOTE    DERRON PIPKINS  PZW:[id number] DOB: Oct 24, 1945 DOA: 08/28/2017 PCP: Colon Branch, MD   Brief Narrative: Patient is a 35 old male with past medical history significant for aortic stenosis, schizoaffective disorder, bipolar disorder, rheumatoid arthritis, degenerative osteoarthritis, hyperlipidemia, hypertension, left leg DVT who presents to the emergency department with complaints of difficulty breathing and leg swelling.  He reported that his left lower extremity was swollen, red and also had some ulcers.  Patient also found to have ulcers  on left lower extremity.  He follows with Dr. [facility] wound care center. Patient admitted for further management of left lower extremity cellulitis  Assessment & Plan:   Principal Problem:   Cellulitis Active Problems:   Aortic stenosis, severe   Sleep apnea   Schizoaffective disorder, bipolar type (HCC)   Rheumatoid arthritis ([place])   Hyperlipidemia   Morbid obesity ([place])   Hypertension   Left leg DVT ([place])   Sepsis ([place])    Bacteremia: Blood cultures growing pseudomonas aeruginosa sensitive to ciprofloxacin.  He was on cefepime which has been changed to Cipro today.  Will change IV antibiotics to oral when he nears to the  discharge date.  Cellulitis/abscess: Wound care following.  Cellulitis of the left lower extremity continues to improve.  Erythema and inflammation decreasing . He has history of chronic status dermatitis on bilateral lower extremities. He follows with wound care as an outpatient at [facility]. No abscess was seen on the CT imaging.  Negative for DVT.  Shortness of breath: Exertional.  Cud also be associated  with his severe aortic stenosis.    Severe aortic stenosis:Patient should follow-up with cardiology as an outpatient on discharge. Patient follows with Dr. Burt Knack. Echocardiogram shows EF of 65-70%,severe LVH,severe aortic stenosis  Rheumatoid arthritis: Follows with Dr. Berna Bue.  On  chronic low-dose steroids. Methotrexate and tofacitinib were held in the setting of acute infection.  He should follow-up with his rheumatologist on discharge  DVT: We will continue Eliquis  Debility/balance issues: He is wheelchair-bound. He can hardly ambulate without it.  Physical therapy evaluated the patient and recommended outpatient follow-up.    DVT prophylaxis:Eliquis Code Status: Full code Family Communication: Spouse at the bedside Disposition Plan: Home in 2-3 days   Consultants: None  Procedures: Echocardiogram  Antimicrobials: Vanco: 2/7 - 2/8                            Cefepime: 2/8- 2/9                            Ciprofloxacin:Since  2/9   Subjective: Patient seen and examined the bedside this morning.  Remains comfortable.  Reported that his left lower extremity cellulitis has been improving.  I have updated the family about the current situation and plan.  Objective: Vitals:   08/30/17 1613 08/30/17 2054 08/31/17 0553 08/31/17 0600  BP: 129/73 (!) 130/50 (!) 147/71   Pulse: 60 (!) 59 63   Resp: 20 19 19  (!) 24  Temp: 98.3 F (36.8 C) 98.1 F (36.7 C) 98.3 F (36.8 C)   TempSrc: Oral Oral Oral   SpO2: 94% 100% 92%   Weight:      Height:        Intake/Output Summary (Last 24 hours) at 08/31/2017 1359 Last data filed at 08/31/2017 0800 Gross per 24 hour  Intake 840 ml  Output  2150 ml  Net -1310 ml   Filed Weights   08/28/17 2343  Weight: 104 kg (229 lb 4.5 oz)    Examination:  General exam: Appears calm and comfortable ,Not in distress,average built HEENT:PERRL,Oral mucosa moist, Ear/Nose normal on gross exam Respiratory system: Bilateral equal air entry, normal vesicular breath sounds, no wheezes or crackles  Cardiovascular system: S1 & S2 heard, RRR. No JVD, rubs, gallops or clicks. No pedal edema.Murmur present Gastrointestinal system: Abdomen is mildly distended, soft and nontender. No organomegaly or masses felt. Normal bowel sounds  heard. Central nervous system: Alert and oriented. No focal neurological deficits. Extremities:  Edema of the left lower extremity, no clubbing ,no cyanosis, distal peripheral pulses palpable. Skin: Bilateral chronic venous insufficiency, left lower extremity erythematous/edematous, old healing ulcers,active ulcer present on the medial side of the left lower extremity covered with dressing MSK: Normal muscle bulk,tone ,power Psychiatry: Judgement and insight appear normal. Mood & affect appropriate.     Data Reviewed: I have personally reviewed following labs and imaging studies  CBC: Recent Labs  Lab 08/28/17 1511 08/29/17 0440 08/30/17 0652 08/31/17 0538  WBC 14.0* 10.4 6.6 5.8  NEUTROABS 12.9*  --   --   --   HGB 13.2 11.5* 11.9* 12.0*  HCT 39.3 34.0* 34.8* 35.4*  MCV 100.0 98.6 96.9 97.0  PLT 121* 111* 103* 789*   Basic Metabolic Panel: Recent Labs  Lab 08/28/17 1511 08/29/17 0440 08/30/17 0652 08/31/17 0538  NA 139 136 136 138  K 4.3 3.8 4.1 4.3  CL 102 104 103 103  CO2 30 28 29 29   GLUCOSE 100* 99 94 94  BUN 25* 21* 19 17  CREATININE 0.71 0.58* 0.58* 0.53*  CALCIUM 8.7* 8.0* 8.2* 8.5*   GFR: Estimated Creatinine Clearance: 87.4 mL/min (A) (by C-G formula based on SCr of 0.53 mg/dL (L)). Liver Function Tests: Recent Labs  Lab 08/28/17 1511  AST 34  ALT 23  ALKPHOS 62  BILITOT 0.8  PROT 6.9  ALBUMIN 3.6   No results for input(s): LIPASE, AMYLASE in the last 168 hours. No results for input(s): AMMONIA in the last 168 hours. Coagulation Profile: Recent Labs  Lab 08/28/17 1511  INR 1.12   Cardiac Enzymes: Recent Labs  Lab 08/29/17 0440 08/29/17 1001 08/29/17 1602  TROPONINI 0.03* 0.03* <0.03   BNP (last 3 results) No results for input(s): PROBNP in the last 8760 hours. HbA1C: No results for input(s): HGBA1C in the last 72 hours. CBG: No results for input(s): GLUCAP in the last 168 hours. Lipid Profile: No results for input(s): CHOL, HDL,  LDLCALC, TRIG, CHOLHDL, LDLDIRECT in the last 72 hours. Thyroid Function Tests: No results for input(s): TSH, T4TOTAL, FREET4, T3FREE, THYROIDAB in the last 72 hours. Anemia Panel: No results for input(s): VITAMINB12, FOLATE, FERRITIN, TIBC, IRON, RETICCTPCT in the last 72 hours. Sepsis Labs: Recent Labs  Lab 08/28/17 1525 08/28/17 1924  LATICACIDVEN 1.60 1.36    Recent Results (from the past 240 hour(s))  Culture, blood (Routine x 2)     Status: Abnormal   Collection Time: 08/28/17  3:15 PM  Result Value Ref Range Status   Specimen Description   Final    BLOOD BLOOD LEFT ARM Performed at [facility], [address] [place], [place] [address]    Special Requests   Final    BOTTLES DRAWN AEROBIC AND ANAEROBIC Blood Culture adequate volume Performed at [facility], [address] [place], [place] [address]  Culture  Setup Time   Final    GRAM NEGATIVE RODS AEROBIC BOTTLE ONLY CRITICAL VALUE NOTED.  VALUE IS CONSISTENT WITH PREVIOUSLY REPORTED AND CALLED VALUE.    Culture (A)  Final    PSEUDOMONAS AERUGINOSA SUSCEPTIBILITIES PERFORMED ON PREVIOUS CULTURE WITHIN THE LAST 5 DAYS. Performed at [facility], [place] [address] [place], [place] [address]    Report Status 08/31/2017 FINAL  Final  Culture, blood (Routine x 2)     Status: Abnormal   Collection Time: 08/28/17  3:40 PM  Result Value Ref Range Status   Specimen Description   Final    BLOOD RIGHT ANTECUBITAL Performed at [facility], [address] [place], [place] [address]    Special Requests   Final    BOTTLES DRAWN AEROBIC AND ANAEROBIC Blood Culture adequate volume Performed at [facility], [address] [place], [place] [address]    Culture  Setup Time   Final    GRAM NEGATIVE RODS AEROBIC BOTTLE ONLY CRITICAL RESULT CALLED TO, READ BACK BY AND VERIFIED WITH: T. Green Pharm.D. 16:45 08/29/17 ([email or web address]) Performed at [facility], [place] [address], [place], [place] [address]    Culture PSEUDOMONAS AERUGINOSA (A)  Final   Report Status 08/31/2017 FINAL  Final   Organism ID, Bacteria PSEUDOMONAS AERUGINOSA  Final      Susceptibility   Pseudomonas aeruginosa - MIC*    CEFTAZIDIME 4 SENSITIVE Sensitive     CIPROFLOXACIN <=0.25 SENSITIVE Sensitive     GENTAMICIN 2 SENSITIVE Sensitive     IMIPENEM 1 SENSITIVE Sensitive     PIP/TAZO 8 SENSITIVE Sensitive     CEFEPIME 8 SENSITIVE Sensitive     * PSEUDOMONAS AERUGINOSA  Blood Culture ID Panel (Reflexed)     Status: Abnormal   Collection Time: 08/28/17  3:40 PM  Result Value Ref Range Status   Enterococcus species NOT DETECTED NOT DETECTED Final   Vancomycin resistance NOT DETECTED NOT DETECTED Final   Listeria monocytogenes NOT DETECTED NOT DETECTED Final   Staphylococcus species NOT DETECTED NOT DETECTED Final   Staphylococcus aureus NOT DETECTED NOT DETECTED Final   Methicillin resistance NOT DETECTED NOT DETECTED Final   Streptococcus species NOT DETECTED NOT DETECTED Final   Streptococcus agalactiae NOT DETECTED NOT DETECTED Final   Streptococcus pneumoniae NOT DETECTED NOT DETECTED Final   Streptococcus pyogenes NOT DETECTED NOT DETECTED Final   Acinetobacter baumannii NOT DETECTED NOT DETECTED Final   Enterobacteriaceae species NOT DETECTED NOT DETECTED Final   Enterobacter cloacae complex NOT DETECTED NOT DETECTED Final   Escherichia coli NOT DETECTED NOT DETECTED Final   Klebsiella oxytoca NOT DETECTED NOT DETECTED Final   Klebsiella pneumoniae NOT DETECTED NOT DETECTED Final   Proteus species NOT DETECTED NOT DETECTED Final   Serratia marcescens NOT DETECTED NOT DETECTED Final   Carbapenem resistance NOT DETECTED NOT DETECTED Final   Haemophilus influenzae NOT DETECTED NOT DETECTED Final   Neisseria meningitidis NOT DETECTED NOT DETECTED Final   Pseudomonas aeruginosa DETECTED (A) NOT DETECTED Final    Comment: CRITICAL RESULT CALLED TO, READ BACK BY AND VERIFIED WITH: T.  Green Pharm.D. 16:45 08/29/17 ([email or web address])    Candida albicans NOT DETECTED NOT DETECTED Final   Candida glabrata NOT DETECTED NOT DETECTED Final   Candida krusei NOT DETECTED NOT DETECTED Final   Candida parapsilosis NOT DETECTED NOT DETECTED Final   Candida tropicalis NOT DETECTED NOT DETECTED Final    Comment: Performed at [facility], [place] [address], [place], [place] [address]         Radiology Studies: No results found.      Scheduled Meds: . apixaban  5 mg Oral BID  . divalproex  1,500 mg Oral QHS  . metoprolol succinate  12.5 mg Oral Daily  . mirabegron ER  50 mg Oral Daily  . predniSONE  5 mg Oral Q breakfast  . ziprasidone  80 mg Oral Daily   Continuous Infusions: . ciprofloxacin       LOS: 3 days    Time spent: More than 50% of that time was spent in counseling and/or coordination of care.      Marene Lenz, MD [facility] Pager [phone]  If 7PM-7AM, please contact night-coverage [email or web address] Password [email or web address] 08/31/2017, 1:59 PM

## 2017-09-01 ENCOUNTER — Ambulatory Visit: Payer: Medicare Other | Admitting: Pulmonary Disease

## 2017-09-01 ENCOUNTER — Telehealth: Payer: Self-pay | Admitting: Pulmonary Disease

## 2017-09-01 LAB — BASIC METABOLIC PANEL
Anion gap: 9 (ref 5–15)
BUN: 17 mg/dL (ref 6–20)
CALCIUM: 8.6 mg/dL — AB (ref 8.9–10.3)
CHLORIDE: 100 mmol/L — AB (ref 101–111)
CO2: 30 mmol/L (ref 22–32)
CREATININE: 0.6 mg/dL — AB (ref 0.61–1.24)
GFR calc non Af Amer: >60 mL/min (ref >60)
GLUCOSE: 87 mg/dL (ref 65–99)
Potassium: 4.2 mmol/L (ref 3.5–5.1)
Sodium: 139 mmol/L (ref 135–145)

## 2017-09-01 LAB — CBC
HEMATOCRIT: 35 % — AB (ref 39.0–52.0)
Hemoglobin: 11.9 g/dL — ABNORMAL LOW (ref 13.0–17.0)
MCH: 33.5 pg (ref 26.0–34.0)
MCHC: 34 g/dL (ref 30.0–36.0)
MCV: 98.6 fL (ref 78.0–100.0)
Platelets: 108 10*3/uL — ABNORMAL LOW (ref 150–400)
RBC: 3.55 MIL/uL — ABNORMAL LOW (ref 4.22–5.81)
RDW: 14.3 % (ref 11.5–15.5)
WBC: 5.2 10*3/uL (ref 4.0–10.5)

## 2017-09-01 NOTE — Telephone Encounter (Signed)
No problem will make allowance for that

## 2017-09-01 NOTE — Telephone Encounter (Signed)
Spoke with pt's spouse, states that pt has been in hospital since 2/7 d/t cellulitis in L leg.  Pt's spouse states she wants to make RA aware because pt is wearing hospital-provided cpap and not his personal machine, so his DL may not be correct.  Forwarding to RA as FYI.

## 2017-09-01 NOTE — Progress Notes (Signed)
Pt. placed on CPAP for h/s, humidifier refilled, oxygen on at 3 lpm, tolerating well.

## 2017-09-01 NOTE — Consult Note (Signed)
[place] Nurse wound consult note Reason for Consult: LLE edema, erythema, intact, fluid (serum)-filled blister. Clinical CEAP 6. RLE with rope-like varicosities, Clinical CEAP 4a. Wound type:infectious, venous insuficiency, traumatic Pressure Injury POA: NA Measurement: blister on LLE.  2cm x 1.5cm with <1cm elevation,. Right knee with 2.4cm x 1.6cm traumatic wound covered by dried serum. Wound bed: None visible. Drainage (amount, consistency, odor) none Periwound: severe venous insufficiency with bilateral hemosiderin staining. Warmth is improved, edema is resolving as patient has been in bed since admission.  Dressing procedure/placement/frequency: I will provide Nursing with guidance via the orders for care of the LLE intact serum filled blister using xeroform gauze.  Both LEs to be wrapped using a dry boot (Kerlix from toe-to-knee topped with ACE wrap).  HE will use Prevalon boots while in bed. I have provided an pressure redistribution chair pad for his use when OOB in chair.  Patient uses Circ-Aid (velcro closure) LE compression device as neither he nor his wife can apply compression hosiery due to arthritis in their hands. He is followed by the outpatient [facility] in [employer] and should resume care there post discharge.  [facility] nursing team will not follow, but will remain available to this patient, the nursing and medical teams.  Please re-consult if needed. Thanks, Maudie Flakes, MSN, RN, [place], Arther Abbott  Pager# [phone]

## 2017-09-01 NOTE — Progress Notes (Signed)
PROGRESS NOTE    Shaun Yoder  WUX:[id number] DOB: 1945/08/09 DOA: 08/28/2017 PCP: Colon Branch, MD   Brief Narrative: Patient is a 6 old male with past medical history significant for aortic stenosis, schizoaffective disorder, bipolar disorder, rheumatoid arthritis, degenerative osteoarthritis, hyperlipidemia, hypertension, left leg DVT who presents to the emergency department with complaints of difficulty breathing and leg swelling.  He reported that his left lower extremity was swollen, red and also had some ulcers.  Patient also found to have ulcers  on left lower extremity.  He follows with Dr. Karie Chimera wound care center. Patient admitted for further management of left lower extremity cellulitis.  Blood cultures came out to be positive for Pseudomonas aeruginosa sensitive to ciprofloxacin.  Assessment & Plan:   Principal Problem:   Bacteremia Active Problems:   Aortic stenosis, severe   Sleep apnea   Schizoaffective disorder, bipolar type (HCC)   Rheumatoid arthritis ([place])   Hyperlipidemia   Morbid obesity ([place])   Hypertension   Left leg DVT (HCC)   Sepsis ([place])   Cellulitis    Bacteremia: Blood cultures growing pseudomonas aeruginosa sensitive to ciprofloxacin.  He was on cefepime which has been changed to Cipro on 08/31/17.  Will change IV antibiotics to oral when he nears to the  discharge date. Likely in 1-2 days  Cellulitis/abscess: Wound care following.  Cellulitis of the left lower extremity continues to improve.  Erythema and inflammation decreasing . He has history of chronic status dermatitis on bilateral lower extremities. He follows with wound care as an outpatient at [facility]. No abscess was seen on the CT imaging.  Negative for DVT.  Shortness of breath: Exertional.  Cud also be associated  with his severe aortic stenosis.    Severe aortic stenosis:Patient should follow-up with cardiology as an outpatient on discharge. Patient follows with Dr.  Burt Knack. Echocardiogram shows EF of 65-70%,severe LVH,severe aortic stenosis  Rheumatoid arthritis: Follows with Dr. Berna Bue.  On chronic low-dose steroids. Methotrexate and tofacitinib were held in the setting of acute infection.  He should follow-up with his rheumatologist on discharge  DVT: We will continue Eliquis  Debility/balance issues: He is wheelchair-bound. He can hardly ambulate without it.  Physical therapy evaluated the patient and recommended outpatient follow-up.    DVT prophylaxis:Eliquis Code Status: Full code Family Communication: Spouse at the bedside Disposition Plan: Home in 1-2 days   Consultants: None  Procedures: Echocardiogram  Antimicrobials: Vanco: 2/7 - 2/8                            Cefepime: 2/8- 2/9                            Ciprofloxacin:Since  2/9   Subjective: Patient seen and examined the patient this morning.  Remains comfortable.  Left lower extremity sialitis improving.  Patient is afebrile.  Vitals stable. Objective: Vitals:   08/31/17 1432 08/31/17 2043 08/31/17 2337 09/01/17 0553  BP: 121/66 125/65  (!) 116/54  Pulse: (!) 56 (!) 55 64 (!) 56  Resp: (!) 22 (!) 24 20 (!) 22  Temp: 98.3 F (36.8 C) 98.3 F (36.8 C)  97.7 F (36.5 C)  TempSrc: Oral Oral  Oral  SpO2: 98% 97% 98% 97%  Weight:      Height:        Intake/Output Summary (Last 24 hours) at 09/01/2017 1404 Last data  filed at 09/01/2017 0600 Gross per 24 hour  Intake 640 ml  Output 450 ml  Net 190 ml   Filed Weights   08/28/17 2343  Weight: 104 kg (229 lb 4.5 oz)    Examination:  General exam: Appears calm and comfortable ,Not in distress,average built HEENT:PERRL,Oral mucosa moist, Ear/Nose normal on gross exam Respiratory system: Bilateral equal air entry, normal vesicular breath sounds, no wheezes or crackles  Cardiovascular system: S1 & S2 heard, RRR. No JVD, murmurs, rubs, gallops or clicks. Murmur present Gastrointestinal system: Abdomen is  nondistended, soft and nontender. No organomegaly or masses felt. Normal bowel sounds heard. Central nervous system: Alert and oriented. No focal neurological deficits. Extremities: Trace edema, no clubbing ,no cyanosis, distal peripheral pulses palpable. Skin: Bilateral chronic venous insufficiency, left lower extremity erythematous/edematous, old healing ulcer near the left knee,active ulcer present on the medial side of the left lower extremity covered with dressing MSK: Normal muscle bulk,tone ,power Psychiatry: Judgement and insight appear normal. Mood & affect appropriate.      Data Reviewed: I have personally reviewed following labs and imaging studies  CBC: Recent Labs  Lab 08/28/17 1511 08/29/17 0440 08/30/17 0652 08/31/17 0538 09/01/17 0604  WBC 14.0* 10.4 6.6 5.8 5.2  NEUTROABS 12.9*  --   --   --   --   HGB 13.2 11.5* 11.9* 12.0* 11.9*  HCT 39.3 34.0* 34.8* 35.4* 35.0*  MCV 100.0 98.6 96.9 97.0 98.6  PLT 121* 111* 103* 102* 932*   Basic Metabolic Panel: Recent Labs  Lab 08/28/17 1511 08/29/17 0440 08/30/17 0652 08/31/17 0538 09/01/17 0604  NA 139 136 136 138 139  K 4.3 3.8 4.1 4.3 4.2  CL 102 104 103 103 100*  CO2 30 28 29 29 30   GLUCOSE 100* 99 94 94 87  BUN 25* 21* 19 17 17   CREATININE 0.71 0.58* 0.58* 0.53* 0.60*  CALCIUM 8.7* 8.0* 8.2* 8.5* 8.6*   GFR: Estimated Creatinine Clearance: 87.4 mL/min (A) (by C-G formula based on SCr of 0.6 mg/dL (L)). Liver Function Tests: Recent Labs  Lab 08/28/17 1511  AST 34  ALT 23  ALKPHOS 62  BILITOT 0.8  PROT 6.9  ALBUMIN 3.6   No results for input(s): LIPASE, AMYLASE in the last 168 hours. No results for input(s): AMMONIA in the last 168 hours. Coagulation Profile: Recent Labs  Lab 08/28/17 1511  INR 1.12   Cardiac Enzymes: Recent Labs  Lab 08/29/17 0440 08/29/17 1001 08/29/17 1602  TROPONINI 0.03* 0.03* <0.03   BNP (last 3 results) No results for input(s): PROBNP in the last 8760  hours. HbA1C: No results for input(s): HGBA1C in the last 72 hours. CBG: No results for input(s): GLUCAP in the last 168 hours. Lipid Profile: No results for input(s): CHOL, HDL, LDLCALC, TRIG, CHOLHDL, LDLDIRECT in the last 72 hours. Thyroid Function Tests: No results for input(s): TSH, T4TOTAL, FREET4, T3FREE, THYROIDAB in the last 72 hours. Anemia Panel: No results for input(s): VITAMINB12, FOLATE, FERRITIN, TIBC, IRON, RETICCTPCT in the last 72 hours. Sepsis Labs: Recent Labs  Lab 08/28/17 1525 08/28/17 1924  LATICACIDVEN 1.60 1.36    Recent Results (from the past 240 hour(s))  Culture, blood (Routine x 2)     Status: Abnormal   Collection Time: 08/28/17  3:15 PM  Result Value Ref Range Status   Specimen Description   Final    BLOOD BLOOD LEFT ARM Performed at [facility], [address], [place], [place] [address]  Special Requests   Final    BOTTLES DRAWN AEROBIC AND ANAEROBIC Blood Culture adequate volume Performed at [facility], [address] [place], [place] [address]    Culture  Setup Time   Final    GRAM NEGATIVE RODS AEROBIC BOTTLE ONLY CRITICAL VALUE NOTED.  VALUE IS CONSISTENT WITH PREVIOUSLY REPORTED AND CALLED VALUE.    Culture (A)  Final    PSEUDOMONAS AERUGINOSA SUSCEPTIBILITIES PERFORMED ON PREVIOUS CULTURE WITHIN THE LAST 5 DAYS. Performed at [facility], [place] [address], [place], [place] [address]    Report Status 08/31/2017 FINAL  Final  Culture, blood (Routine x 2)     Status: Abnormal   Collection Time: 08/28/17  3:40 PM  Result Value Ref Range Status   Specimen Description   Final    BLOOD RIGHT ANTECUBITAL Performed at [facility], [address] [place], [place] [address]    Special Requests   Final    BOTTLES DRAWN AEROBIC AND ANAEROBIC Blood Culture adequate volume Performed at [facility], [address] [place], [place] [address]    Culture  Setup Time   Final    GRAM  NEGATIVE RODS AEROBIC BOTTLE ONLY CRITICAL RESULT CALLED TO, READ BACK BY AND VERIFIED WITH: T. Green Pharm.D. 16:45 08/29/17 ([email or web address]) Performed at [facility], [place] [address] [place], [place] [address]    Culture PSEUDOMONAS AERUGINOSA (A)  Final   Report Status 08/31/2017 FINAL  Final   Organism ID, Bacteria PSEUDOMONAS AERUGINOSA  Final      Susceptibility   Pseudomonas aeruginosa - MIC*    CEFTAZIDIME 4 SENSITIVE Sensitive     CIPROFLOXACIN <=0.25 SENSITIVE Sensitive     GENTAMICIN 2 SENSITIVE Sensitive     IMIPENEM 1 SENSITIVE Sensitive     PIP/TAZO 8 SENSITIVE Sensitive     CEFEPIME 8 SENSITIVE Sensitive     * PSEUDOMONAS AERUGINOSA  Blood Culture ID Panel (Reflexed)     Status: Abnormal   Collection Time: 08/28/17  3:40 PM  Result Value Ref Range Status   Enterococcus species NOT DETECTED NOT DETECTED Final   Vancomycin resistance NOT DETECTED NOT DETECTED Final   Listeria monocytogenes NOT DETECTED NOT DETECTED Final   Staphylococcus species NOT DETECTED NOT DETECTED Final   Staphylococcus aureus NOT DETECTED NOT DETECTED Final   Methicillin resistance NOT DETECTED NOT DETECTED Final   Streptococcus species NOT DETECTED NOT DETECTED Final   Streptococcus agalactiae NOT DETECTED NOT DETECTED Final   Streptococcus pneumoniae NOT DETECTED NOT DETECTED Final   Streptococcus pyogenes NOT DETECTED NOT DETECTED Final   Acinetobacter baumannii NOT DETECTED NOT DETECTED Final   Enterobacteriaceae species NOT DETECTED NOT DETECTED Final   Enterobacter cloacae complex NOT DETECTED NOT DETECTED Final   Escherichia coli NOT DETECTED NOT DETECTED Final   Klebsiella oxytoca NOT DETECTED NOT DETECTED Final   Klebsiella pneumoniae NOT DETECTED NOT DETECTED Final   Proteus species NOT DETECTED NOT DETECTED Final   Serratia marcescens NOT DETECTED NOT DETECTED Final   Carbapenem resistance NOT DETECTED NOT DETECTED Final   Haemophilus influenzae NOT DETECTED NOT DETECTED Final    Neisseria meningitidis NOT DETECTED NOT DETECTED Final   Pseudomonas aeruginosa DETECTED (A) NOT DETECTED Final    Comment: CRITICAL RESULT CALLED TO, READ BACK BY AND VERIFIED WITH: T. Green Pharm.D. 16:45 08/29/17 ([email or web address])    Candida albicans NOT DETECTED NOT DETECTED Final   Candida glabrata NOT DETECTED NOT DETECTED Final  Candida krusei NOT DETECTED NOT DETECTED Final   Candida parapsilosis NOT DETECTED NOT DETECTED Final   Candida tropicalis NOT DETECTED NOT DETECTED Final    Comment: Performed at [facility], [place] [address] [place], [place] [address]         Radiology Studies: No results found.      Scheduled Meds: . apixaban  5 mg Oral BID  . ARIPiprazole  30 mg Oral QHS  . divalproex  1,500 mg Oral QHS  . metoprolol succinate  12.5 mg Oral Daily  . mirabegron ER  50 mg Oral Daily  . predniSONE  5 mg Oral Q breakfast  . ziprasidone  80 mg Oral Daily   Continuous Infusions: . ciprofloxacin Stopped (09/01/17 0355)     LOS: 4 days    Time spent: More than 50% of that time was spent in counseling and/or coordination of care.      Marene Lenz, MD [facility] Pager [phone]  If 7PM-7AM, please contact night-coverage [email or web address] Password [facility] 09/01/2017, 2:04 PM

## 2017-09-01 NOTE — Progress Notes (Signed)
Date:  September 01, 2017 Chart reviewed for concurrent status and case management needs.  Will continue to follow patient progress. Positive bld. Cultures. Discharge Planning: following for needs.  None present at this time of review. Expected discharge date: September 04, 2017 Velva Harman, BSN, [place], [place]

## 2017-09-02 DIAGNOSIS — R7881 Bacteremia: Secondary | ICD-10-CM

## 2017-09-02 LAB — BASIC METABOLIC PANEL
ANION GAP: 11 (ref 5–15)
BUN: 17 mg/dL (ref 6–20)
CHLORIDE: 99 mmol/L — AB (ref 101–111)
CO2: 31 mmol/L (ref 22–32)
CREATININE: 0.54 mg/dL — AB (ref 0.61–1.24)
Calcium: 9 mg/dL (ref 8.9–10.3)
GFR calc non Af Amer: >60 mL/min (ref >60)
Glucose, Bld: 95 mg/dL (ref 65–99)
POTASSIUM: 4 mmol/L (ref 3.5–5.1)
Sodium: 141 mmol/L (ref 135–145)

## 2017-09-02 LAB — CBC
HCT: 34 % — ABNORMAL LOW (ref 39.0–52.0)
HEMOGLOBIN: 11.7 g/dL — AB (ref 13.0–17.0)
MCH: 33.5 pg (ref 26.0–34.0)
MCHC: 34.4 g/dL (ref 30.0–36.0)
MCV: 97.4 fL (ref 78.0–100.0)
Platelets: 107 10*3/uL — ABNORMAL LOW (ref 150–400)
RBC: 3.49 MIL/uL — AB (ref 4.22–5.81)
RDW: 14.1 % (ref 11.5–15.5)
WBC: 5 10*3/uL (ref 4.0–10.5)

## 2017-09-02 MED ORDER — HYDROCORTISONE 1 % EX CREA
TOPICAL_CREAM | CUTANEOUS | Status: DC | PRN
  Filled 2017-09-02: qty 28

## 2017-09-02 NOTE — Progress Notes (Signed)
Placed pt. on cpap for h/s, oxygen placed in/on circuit at 3 lpm , tolerating well.

## 2017-09-02 NOTE — Progress Notes (Signed)
PROGRESS NOTE    Shaun Yoder  NTZ:[id number] DOB: Dec 06, 1945 DOA: 08/28/2017 PCP: Colon Branch, MD   Brief Narrative: Patient is a 22 old male with past medical history significant for aortic stenosis, schizoaffective disorder, bipolar disorder, rheumatoid arthritis, degenerative osteoarthritis, hyperlipidemia, hypertension, left leg DVT who presents to the emergency department with complaints of difficulty breathing and leg swelling.  He reported that his left lower extremity was swollen, red and also had some ulcers.  Patient also found to have ulcers  on left lower extremity.  He follows with Dr. Lysbeth Galas ,[facility] wound care center. Patient admitted for further management of left lower extremity cellulitis.  Currently he is being treated for Pseudomonas aeruginosa bacteremia.  Assessment & Plan:   Principal Problem:   Bacteremia Active Problems:   Aortic stenosis, severe   Sleep apnea   Schizoaffective disorder, bipolar type (HCC)   Rheumatoid arthritis ([place])   Hyperlipidemia   Morbid obesity ([place])   Hypertension   Left leg DVT (HCC)   Sepsis ([place])   Cellulitis    Bacteremia: Blood cultures grew pseudomonas aeruginosa sensitive to ciprofloxacin.  He was on cefepime which has been changed to Cipro on 09/01/17.  Will change IV antibiotics to oral when he nears to the  discharge date. Repeat blood cultures sent yesterday. He has remained afebrile.  White cell count stable.  Cellulitis/abscess: Wound care following.  Cellulitis of the left lower extremity continues to improve.  Erythema and inflammation decreasing . He has history of chronic status dermatitis on bilateral lower extremities. He follows with wound care as an outpatient at [facility]. No abscess was seen on the CT imaging.  Negative for DVT.  Shortness of breath: Exertional.  Cud also be associated  with his severe aortic stenosis.    Severe aortic stenosis:Patient should follow-up with cardiology as an  outpatient on discharge. Patient follows with Dr. Burt Knack. Echocardiogram shows EF of 65-70%,severe LVH,severe aortic stenosis  Rheumatoid arthritis: Follows with Dr. Berna Bue.  On chronic low-dose steroids. Methotrexate and tofacitinib were held in the setting of acute infection.  He should follow-up with his rheumatologist on discharge  DVT: We will continue Eliquis  Debility/balance issues: He is wheelchair-bound. He can hardly ambulate without it.  Physical therapy evaluated the patient and recommended outpatient follow-up.  DVT prophylaxis:Eliquis Code Status: Full code Family Communication: Spouse at the bedside Disposition Plan: Home in 1-2 days   Consultants: None  Procedures: Echocardiogram  Antimicrobials: Vanco: 2/7 - 2/8                            Cefepime: 2/8- 2/9                            Ciprofloxacin:Since  2/9   Subjective: Patient seen and examined the bedside this morning.  Remains comfortable.  No new issues/events.  Has remained afebrile.   Left lower extremity cellulitis/edema has been improving.    Objective: Vitals:   09/01/17 0553 09/01/17 1426 09/01/17 2126 09/02/17 0605  BP: (!) 116/54 116/69 (!) 144/71 128/60  Pulse: (!) 56 (!) 57 61 (!) 50  Resp: (!) 22 20 (!) 21 19  Temp: 97.7 F (36.5 C) 98.6 F (37 C) 98.8 F (37.1 C) 98.2 F (36.8 C)  TempSrc: Oral Oral Oral Oral  SpO2: 97% 95% 96% 98%  Weight:      Height:  Intake/Output Summary (Last 24 hours) at 09/02/2017 1301 Last data filed at 09/02/2017 1214 Gross per 24 hour  Intake 1033 ml  Output 3100 ml  Net -2067 ml   Filed Weights   08/28/17 2343  Weight: 104 kg (229 lb 4.5 oz)    Examination:  General exam: Appears calm and comfortable ,Not in distress,average built HEENT:PERRL,Oral mucosa moist, Ear/Nose normal on gross exam Respiratory system: Bilateral equal air entry, normal vesicular breath sounds, no wheezes or crackles  Cardiovascular system: S1 & S2 heard,  Murmur present.RRR. No JVD, rubs, gallops or clicks. Gastrointestinal system: Abdomen is nondistended, soft and nontender. No organomegaly or masses felt. Normal bowel sounds heard. Central nervous system: Alert and oriented. No focal neurological deficits. Extremities: Trace edema of left lower extremity Skin:Bilateral chronic venous insufficiency, old healing ulcer near the left knee,healing ulcer present on the medial side of the left lower extremity,B/L lower extremities covered today with wraps MSK: Normal muscle bulk,tone ,power Psychiatry: Judgement and insight appear normal. Mood & affect appropriate.     Data Reviewed: I have personally reviewed following labs and imaging studies  CBC: Recent Labs  Lab 08/28/17 1511 08/29/17 0440 08/30/17 0652 08/31/17 0538 09/01/17 0604 09/02/17 0603  WBC 14.0* 10.4 6.6 5.8 5.2 5.0  NEUTROABS 12.9*  --   --   --   --   --   HGB 13.2 11.5* 11.9* 12.0* 11.9* 11.7*  HCT 39.3 34.0* 34.8* 35.4* 35.0* 34.0*  MCV 100.0 98.6 96.9 97.0 98.6 97.4  PLT 121* 111* 103* 102* 108* 989*   Basic Metabolic Panel: Recent Labs  Lab 08/29/17 0440 08/30/17 0652 08/31/17 0538 09/01/17 0604 09/02/17 0603  NA 136 136 138 139 141  K 3.8 4.1 4.3 4.2 4.0  CL 104 103 103 100* 99*  CO2 28 29 29 30 31   GLUCOSE 99 94 94 87 95  BUN 21* 19 17 17 17   CREATININE 0.58* 0.58* 0.53* 0.60* 0.54*  CALCIUM 8.0* 8.2* 8.5* 8.6* 9.0   GFR: Estimated Creatinine Clearance: 87.4 mL/min (A) (by C-G formula based on SCr of 0.54 mg/dL (L)). Liver Function Tests: Recent Labs  Lab 08/28/17 1511  AST 34  ALT 23  ALKPHOS 62  BILITOT 0.8  PROT 6.9  ALBUMIN 3.6   No results for input(s): LIPASE, AMYLASE in the last 168 hours. No results for input(s): AMMONIA in the last 168 hours. Coagulation Profile: Recent Labs  Lab 08/28/17 1511  INR 1.12   Cardiac Enzymes: Recent Labs  Lab 08/29/17 0440 08/29/17 1001 08/29/17 1602  TROPONINI 0.03* 0.03* <0.03   BNP  (last 3 results) No results for input(s): PROBNP in the last 8760 hours. HbA1C: No results for input(s): HGBA1C in the last 72 hours. CBG: No results for input(s): GLUCAP in the last 168 hours. Lipid Profile: No results for input(s): CHOL, HDL, LDLCALC, TRIG, CHOLHDL, LDLDIRECT in the last 72 hours. Thyroid Function Tests: No results for input(s): TSH, T4TOTAL, FREET4, T3FREE, THYROIDAB in the last 72 hours. Anemia Panel: No results for input(s): VITAMINB12, FOLATE, FERRITIN, TIBC, IRON, RETICCTPCT in the last 72 hours. Sepsis Labs: Recent Labs  Lab 08/28/17 1525 08/28/17 1924  LATICACIDVEN 1.60 1.36    Recent Results (from the past 240 hour(s))  Culture, blood (Routine x 2)     Status: Abnormal   Collection Time: 08/28/17  3:15 PM  Result Value Ref Range Status   Specimen Description   Final    BLOOD BLOOD LEFT ARM Performed at [facility]  [address], [place] [place], [place] [address]    Special Requests   Final    BOTTLES DRAWN AEROBIC AND ANAEROBIC Blood Culture adequate volume Performed at [facility], [place] [place], [place] [address]    Culture  Setup Time   Final    GRAM NEGATIVE RODS AEROBIC BOTTLE ONLY CRITICAL VALUE NOTED.  VALUE IS CONSISTENT WITH PREVIOUSLY REPORTED AND CALLED VALUE.    Culture (A)  Final    PSEUDOMONAS AERUGINOSA SUSCEPTIBILITIES PERFORMED ON PREVIOUS CULTURE WITHIN THE LAST 5 DAYS. Performed at [facility], [place] [address], [place], [place] [address]    Report Status 08/31/2017 FINAL  Final  Culture, blood (Routine x 2)     Status: Abnormal   Collection Time: 08/28/17  3:40 PM  Result Value Ref Range Status   Specimen Description   Final    BLOOD RIGHT ANTECUBITAL Performed at [facility], [address] [place], [place] [address]    Special Requests   Final    BOTTLES DRAWN AEROBIC AND ANAEROBIC Blood Culture adequate volume Performed at [facility], [address]  [place], [place] [address]    Culture  Setup Time   Final    GRAM NEGATIVE RODS AEROBIC BOTTLE ONLY CRITICAL RESULT CALLED TO, READ BACK BY AND VERIFIED WITH: T. Green Pharm.D. 16:45 08/29/17 ([email or web address]) Performed at [facility], [place] [address], [place], [place] [address]    Culture PSEUDOMONAS AERUGINOSA (A)  Final   Report Status 08/31/2017 FINAL  Final   Organism ID, Bacteria PSEUDOMONAS AERUGINOSA  Final      Susceptibility   Pseudomonas aeruginosa - MIC*    CEFTAZIDIME 4 SENSITIVE Sensitive     CIPROFLOXACIN <=0.25 SENSITIVE Sensitive     GENTAMICIN 2 SENSITIVE Sensitive     IMIPENEM 1 SENSITIVE Sensitive     PIP/TAZO 8 SENSITIVE Sensitive     CEFEPIME 8 SENSITIVE Sensitive     * PSEUDOMONAS AERUGINOSA  Blood Culture ID Panel (Reflexed)     Status: Abnormal   Collection Time: 08/28/17  3:40 PM  Result Value Ref Range Status   Enterococcus species NOT DETECTED NOT DETECTED Final   Vancomycin resistance NOT DETECTED NOT DETECTED Final   Listeria monocytogenes NOT DETECTED NOT DETECTED Final   Staphylococcus species NOT DETECTED NOT DETECTED Final   Staphylococcus aureus NOT DETECTED NOT DETECTED Final   Methicillin resistance NOT DETECTED NOT DETECTED Final   Streptococcus species NOT DETECTED NOT DETECTED Final   Streptococcus agalactiae NOT DETECTED NOT DETECTED Final   Streptococcus pneumoniae NOT DETECTED NOT DETECTED Final   Streptococcus pyogenes NOT DETECTED NOT DETECTED Final   Acinetobacter baumannii NOT DETECTED NOT DETECTED Final   Enterobacteriaceae species NOT DETECTED NOT DETECTED Final   Enterobacter cloacae complex NOT DETECTED NOT DETECTED Final   Escherichia coli NOT DETECTED NOT DETECTED Final   Klebsiella oxytoca NOT DETECTED NOT DETECTED Final   Klebsiella pneumoniae NOT DETECTED NOT DETECTED Final   Proteus species NOT DETECTED NOT DETECTED Final   Serratia marcescens NOT DETECTED NOT DETECTED Final   Carbapenem resistance NOT DETECTED NOT DETECTED Final    Haemophilus influenzae NOT DETECTED NOT DETECTED Final   Neisseria meningitidis NOT DETECTED NOT DETECTED Final   Pseudomonas aeruginosa DETECTED (A) NOT DETECTED Final    Comment: CRITICAL RESULT CALLED TO, READ BACK BY AND VERIFIED WITH: T. Green Pharm.D. 16:45 08/29/17 ([email or web address])    Candida albicans NOT DETECTED  NOT DETECTED Final   Candida glabrata NOT DETECTED NOT DETECTED Final   Candida krusei NOT DETECTED NOT DETECTED Final   Candida parapsilosis NOT DETECTED NOT DETECTED Final   Candida tropicalis NOT DETECTED NOT DETECTED Final    Comment: Performed at [facility], [place] [address] [place], [place] [address]  Culture, blood (Routine X 2) w Reflex to ID Panel     Status: None (Preliminary result)   Collection Time: 09/01/17  3:12 PM  Result Value Ref Range Status   Specimen Description   Final    BLOOD RIGHT HAND Performed at [facility] [address] [place], [place] [address]    Special Requests   Final    BOTTLES DRAWN AEROBIC AND ANAEROBIC Blood Culture adequate volume   Culture   Final    NO GROWTH < 24 HOURS Performed at [facility], [place] [address], [place], [place] [address]    Report Status PENDING  Incomplete  Culture, blood (Routine X 2) w Reflex to ID Panel     Status: None (Preliminary result)   Collection Time: 09/01/17  3:26 PM  Result Value Ref Range Status   Specimen Description   Final    BLOOD LEFT ARM Performed at [facility] [address], [place], [place] [address]    Special Requests   Final    BOTTLES DRAWN AEROBIC AND ANAEROBIC Blood Culture results may not be optimal due to an excessive volume of blood received in culture bottles   Culture   Final    NO GROWTH < 24 HOURS Performed at [facility] [address] [place], [place] [address]    Report Status PENDING  Incomplete         Radiology Studies: No results found.      Scheduled Meds: . apixaban  5 mg Oral BID    . ARIPiprazole  30 mg Oral QHS  . divalproex  1,500 mg Oral QHS  . mirabegron ER  50 mg Oral Daily  . predniSONE  5 mg Oral Q breakfast  . ziprasidone  80 mg Oral Daily   Continuous Infusions: . ciprofloxacin Stopped (09/01/17 1544)     LOS: 5 days    Time spent: More than 50% of that time was spent in counseling and/or coordination of care.      Marene Lenz, MD [facility] Pager [phone]  If 7PM-7AM, please contact night-coverage [email or web address] Password [email or web address] 09/02/2017, 1:01 PM

## 2017-09-02 NOTE — Evaluation (Signed)
Clinical/Bedside Swallow Evaluation Patient Details  Name: CHAMBERLAIN STEINBORN MRN: [id number] Date of Birth: [birth year]  Today's Date: 09/02/2017 Time: SLP Start Time (ACUTE ONLY): 11 SLP Stop Time (ACUTE ONLY): 1604 SLP Time Calculation (min) (ACUTE ONLY): 24 min  Past Medical History:  Past Medical History:  Diagnosis Date  . Anemia   . Cancer ([place])    bladder  . Colitis   . Colon polyps   . Depression   . Diverticulosis   . DVT of deep femoral vein ([place])   . Gastritis   . GERD (gastroesophageal reflux disease)   . History of rectal polyps   . Hyperlipidemia   . Hypertension   . Iron deficiency   . Low back pain   . OA (osteoarthritis)   . Osteonecrosis of shoulder region ([place])   . Schizoaffective disorder, bipolar type ([place])   . Sleep apnea   . Varicose vein    Past Surgical History:  Past Surgical History:  Procedure Laterality Date  . BLADDER SURGERY    . TOTAL KNEE ARTHROPLASTY Bilateral   . VARICOSE VEIN SURGERY     HPI:  54 old male with PMHx significant for aortic stenosis, schizoaffective disorder, bipolar disorder, rheumatoid arthritis, degenerative osteoarthritis, hyperlipidemia, hypertension, left leg DVT who presents to the emergency department with complaints of difficulty breathing and leg swelling. He reported that his left lower extremity was swollen, red and also had some ulcers. Patient also found to have ulcers on left lower extremity. Patient admitted for further management of left lower extremity cellulitis.  He reports new onset swallowing difficulty, stating that he coughs whenever he eats solid, dry foods.   Assessment / Plan / Recommendation Clinical Impression  Pt presents with normal oropharyngeal swallow with adequate mastication, brisk swallow response, and no s/s of aspiration.  He consumed graham crackers, anticipating that they would elicit coughing, and was surprised when they did not.  Recommend continuing current diet.  I  encouraged pt/wife to let RN know if symptoms recurred with breakfast or lunch and that SLP could return for f/u.  They agreed.  Informed RN, Vida Roller.  Please page [facility] SLP pager should symptoms return tomorrow.  Otherwise, no further f/u is warranted.   SLP Visit Diagnosis: Dysphagia, unspecified (R13.10)    Aspiration Risk       Diet Recommendation   regular solids, thin liquids  Medication Administration: Whole meds with liquid    Other  Recommendations Oral Care Recommendations: Oral care BID   Follow up Recommendations None      Frequency and Duration            Prognosis        Swallow Study   General Date of Onset: 08/28/17 HPI: 53 old male with PMHx significant for aortic stenosis, schizoaffective disorder, bipolar disorder, rheumatoid arthritis, degenerative osteoarthritis, hyperlipidemia, hypertension, left leg DVT who presents to the emergency department with complaints of difficulty breathing and leg swelling. He reported that his left lower extremity was swollen, red and also had some ulcers. Patient also found to have ulcers on left lower extremity. Patient admitted for further management of left lower extremity cellulitis.  He reports new onset swallowing difficulty, stating that he coughs whenever he eats solid, dry foods. Type of Study: Bedside Swallow Evaluation Previous Swallow Assessment: no Diet Prior to this Study: Regular;Thin liquids Temperature Spikes Noted: Yes Respiratory Status: Nasal cannula History of Recent Intubation: No Behavior/Cognition: Alert;Cooperative Oral Cavity Assessment: Dry;Erythema Oral Care Completed by SLP: No Oral  Cavity - Dentition: Dentures, top;Dentures, bottom Vision: Functional for self-feeding Self-Feeding Abilities: Needs assist Patient Positioning: Upright in bed Baseline Vocal Quality: Normal Volitional Cough: Strong Volitional Swallow: Able to elicit    Oral/Motor/Sensory Function Overall Oral Motor/Sensory  Function: Within functional limits   Ice Chips Ice chips: Within functional limits   Thin Liquid Thin Liquid: Within functional limits Presentation: Straw    Nectar Thick Nectar Thick Liquid: Not tested   Honey Thick Honey Thick Liquid: Not tested   Puree Puree: Within functional limits   Solid   GO   Solid: Within functional limits        Juan Quam Laurice 09/02/2017,4:15 PM

## 2017-09-02 NOTE — Progress Notes (Signed)
When taking morning VSs - pts HR was 25-30 until pt was stimulated & encouraged to take a deep breath with HR  Returning to the 50's

## 2017-09-02 NOTE — Progress Notes (Signed)
Pt became paranoid / panicking about the CPAP mask and was taking it off. States he cant wear it any longer

## 2017-09-02 NOTE — Care Management Important Message (Signed)
Important Message  Patient Details  Name: Shaun Yoder MRN: [id number] Date of Birth: 02-20-1946   Medicare Important Message Given:  Yes    Kerin Salen 09/02/2017, 12:29 [place] Message  Patient Details  Name: Shaun Yoder MRN: [id number] Date of Birth: April 28, 1946   Medicare Important Message Given:  Yes    Kerin Salen 09/02/2017, 12:29 PM

## 2017-09-02 NOTE — Progress Notes (Signed)
Pt. Wanted cpap taken back off not wanting to wear now, placed back on n/c

## 2017-09-03 NOTE — Clinical Social Work Note (Signed)
Clinical Social Work Assessment  Patient Details  Name: SAMARION EHLE MRN: [id number] Date of Birth: 1946-05-13  Date of referral:  09/03/17               Reason for consult:  Facility Placement                Permission sought to share information with:  Case Manager, Customer service manager, Family Supports Permission granted to share information::  Yes, Verbal Permission Granted  Name::     Building control surveyor::  SNF  Relationship::  Spouse  Contact Information:     Housing/Transportation Living arrangements for the past 2 months:  Single Family Home Source of Information:  Patient, Spouse Patient Interpreter Needed:  None Criminal Activity/Legal Involvement Pertinent to Current Situation/Hospitalization:  No - Comment as needed Significant Relationships:  Spouse Lives with:  Spouse Do you feel safe going back to the place where you live?  Yes Need for family participation in patient care:  Yes (Comment)  Care giving concerns:  No care giving concerns at the time of assessment.    Social Worker assessment / plan:  LCSW following for SNF placement.   Patient admitted for leg swelling and SOB.  LCSW met at bedside with patient and his wife, Janae Bridgeman. Patient and spouse are agreeable to SNF.   Patient reports that he is in an electric wheel chair at home and he is able to transfer himself in and out of the chair. Patient reports that he urinates in a urinal but is able to transfer from chair to toilet for bm. Patient is able to fed himself, but reports needing assistance with bathing and dressing. Patient states he has a safety tub with a seat that he can sit on.   Patient is on O2 in the hospital, but reports that he does not use it at home. Patient states he uses a CPAP at night.   Patients wife is his primary care giver and is limited in how much she can assist due to rheumatoid arthritis.  Spouse reports that patient has a CNA that comes M-F for 3 hours in the  morning.   PLAN: Patient will go to SNF at dc.   Employment status:  Retired Forensic scientist:    PT Recommendations:  [employer] / Referral to community resources:     Patient/Family's Response to care:  Patient and family are proactive in care. Patient is please with services and thankful for LCSW visit.   Patient/Family's Understanding of and Emotional Response to Diagnosis, Current Treatment, and Prognosis:  Patient and family are understanding of diagnosis and agreeable to treatment plan.    Emotional Assessment Appearance:  Appears younger than stated age Attitude/Demeanor/Rapport:    Affect (typically observed):  Calm, Pleasant Orientation:  Oriented to Self, Oriented to Place, Oriented to  Time, Oriented to Situation Alcohol / Substance use:  Not Applicable Psych involvement (Current and /or in the community):  No (Comment)  Discharge Needs  Concerns to be addressed:  No discharge needs identified Readmission within the last 30 days:  No Current discharge risk:  None Barriers to Discharge:  Continued Medical Work up, No SNF bed, Insurance Authorization   Servando Snare, LCSW 09/03/2017, 3:09 PM

## 2017-09-03 NOTE — Evaluation (Signed)
Physical Therapy Evaluation Patient Details Name: Shaun Yoder MRN: [id number] DOB: 1946/06/30 Today's Date: 09/03/2017   History of Present Illness  Patient is a 75 old male with past medical history significant for aortic stenosis, schizoaffective disorder, bipolar disorder, rheumatoid arthritis, degenerative osteoarthritis, hyperlipidemia, hypertension, left leg DVT admitted with complaints of difficulty breathing and leg swelling  Clinical Impression  Patent presents with decreased mobility due to immobility during 7 day hospitalization and previous challenges due to severe RA.  He currently needs mod A for OOB to chair and wife is unable to assist at that level due to RA in her hands as well.  Feel he is appropriate for SNF level rehab at this time to progress mobility prior to d/c home. PT to follow acutely as well.    Follow Up Recommendations SNF;Supervision/Assistance - 24 hour    Equipment Recommendations  None recommended by PT    Recommendations for Other Services       Precautions / Restrictions Precautions Precautions: Fall Restrictions Weight Bearing Restrictions: No      Mobility  Bed Mobility Overal bed mobility: Needs Assistance Bed Mobility: Supine to Sit     Supine to sit: HOB elevated;Min assist     General bed mobility comments: assist to lift trunk  Transfers Overall transfer level: Needs assistance   Transfers: Stand Pivot Transfers;Sit to/from Stand Sit to Stand: Min assist Stand pivot transfers: Mod assist;From elevated surface       General transfer comment: stood twice from bed to assist with hygiene due to stool incontinence; support for stand pivot to recliner and cues for technique; audible crepitus in L hip with mobility/weight bearing  Ambulation/Gait                Stairs            Wheelchair Mobility    Modified Rankin (Stroke Patients Only)       Balance Overall balance assessment: Needs  assistance Sitting-balance support: Single extremity supported;Feet supported Sitting balance-Leahy Scale: Poor Sitting balance - Comments: more secure when he held onto foot board for balance and initially needed min A, but over time improved to sit unsupported with S   Standing balance support: Single extremity supported Standing balance-Leahy Scale: Poor Standing balance comment: standing holding onto armrest on chair for hygiene with min A                              Pertinent Vitals/Pain Pain Assessment: 0-10 Pain Score: 6 (up to 10 with mobility) Pain Location: shoulders, L hip Pain Descriptors / Indicators: Aching Pain Intervention(s): Monitored during session    Home Living Family/patient expects to be discharged to:: Private residence Living Arrangements: Spouse/significant other Available Help at Discharge: Family;Available 24 hours/day(aide 5 days a week 3 hours) Type of Home: House Home Access: Ramped entrance     Home Layout: One level Home Equipment: Other (comment);Wheelchair - power;Hospital bed;Walker - 4 wheels;Cane - quad;Grab bars - toilet;Grab bars - tub/shower;Hand held shower head(orthopedic shoes, [employer]ravo valet seat in SUV for entry, bed rails from [place] on hospital bed) Additional Comments: gait belt, transfer board, reacher orderd from [place]    Prior Function Level of Independence: Needs assistance   Gait / Transfers Assistance Needed: assist out of bed to w/c for about 6 months  ADL's / Homemaking Assistance Needed: aide assists with shower        Hand Dominance   Dominant Hand:  Right    Extremity/Trunk Assessment   Upper Extremity Assessment Upper Extremity Assessment: RUE deficits/detail;LUE deficits/detail RUE Deficits / Details: hand/wrist/elbow/shoulder limitations due to severe RA; grips functionally but weakly, no noted wrist mobility, elbow about -30 degrees extension to about 85, shoulder NT due to pain with bone on bone  changes LUE Deficits / Details: hand/wrist/elbow/shoulder limitations due to severe RA; grips functionally slightly stronger than R, no noted wrist mobility, elbow about -20 degrees extension to about 85, shoulder NT due to pain with bone on bone changes    Lower Extremity Assessment Lower Extremity Assessment: LLE deficits/detail;RLE deficits/detail RLE Deficits / Details: AROM hip flexion grossly 70, knee flexion about 80, ankle mobility limited, reports peripheral neuropathy, strength about 3+/5 hip flexion 4/5 knee extension RLE Sensation: decreased light touch LLE Deficits / Details: AROM hip flexion grossly 30 with pain, knee flexion about 70, ankle mobility limited, reports peripheral neuropathy, strength about 2/5 hip flexion 4-/5 knee extension LLE Sensation: decreased light touch    Cervical / Trunk Assessment Cervical / Trunk Assessment: Kyphotic  Communication   Communication: No difficulties  Cognition Arousal/Alertness: Awake/alert Behavior During Therapy: WFL for tasks assessed/performed Overall Cognitive Status: History of cognitive impairments - at baseline(slightly worse than baseline due to lack of sleep per wife)                                        General Comments General comments (skin integrity, edema, etc.): bilateral LE's with wraps wife reports for swelling, noted one area of eschar over R distal knee pt reports due to running into door frame with w/c    Exercises     Assessment/Plan    PT Assessment Patient needs continued PT services  PT Problem List Decreased strength;Decreased mobility;Decreased balance;Decreased activity tolerance;Decreased range of motion;Decreased safety awareness;Decreased knowledge of precautions;Decreased knowledge of use of DME       PT Treatment Interventions DME instruction;Functional mobility training;Balance training;Patient/family education;Therapeutic activities;Therapeutic exercise    PT Goals  (Current goals can be found in the Care Plan section)  Acute Rehab PT Goals Patient Stated Goal: to go home PT Goal Formulation: With patient/family Time For Goal Achievement: 09/17/17 Potential to Achieve Goals: Good    Frequency Min 3X/week   Barriers to discharge   aide only 3 hours 5 days and wife with RA in her hands unable to assist much    Co-evaluation               AM-PAC PT "6 Clicks" Daily Activity  Outcome Measure Difficulty turning over in bed (including adjusting bedclothes, sheets and blankets)?: Unable Difficulty moving from lying on back to sitting on the side of the bed? : Unable Difficulty sitting down on and standing up from a chair with arms (e.g., wheelchair, bedside commode, etc,.)?: Unable Help needed moving to and from a bed to chair (including a wheelchair)?: A Lot Help needed walking in hospital room?: Total Help needed climbing 3-5 steps with a railing? : Total 6 Click Score: 7    End of Session Equipment Utilized During Treatment: Gait belt Activity Tolerance: Patient limited by fatigue Patient left: with call bell/phone within reach;in chair;with family/visitor present Nurse Communication: Mobility status PT Visit Diagnosis: Muscle weakness (generalized) (M62.81);Other symptoms and signs involving the nervous system (R29.898)    Time: 1050-1130 PT Time Calculation (min) (ACUTE ONLY): 40 min   Charges:   PT  Evaluation $PT Eval Moderate Complexity: 1 Mod PT Treatments $Therapeutic Activity: 8-22 mins $Self Care/Home Management: 8-22   PT G CodesMagda Kiel, [place] 09/03/2017   Reginia Naas 09/03/2017, 11:53 AM

## 2017-09-03 NOTE — Progress Notes (Signed)
PROGRESS NOTE    Shaun Yoder  VPX:[id number] DOB: [birth year] DOA: 08/28/2017 PCP: Colon Branch, MD   Brief Narrative: Patient is a 13 old male with past medical history significant for aortic stenosis, schizoaffective disorder, bipolar disorder, rheumatoid arthritis, degenerative osteoarthritis, hyperlipidemia, hypertension, left leg DVT who presents to the emergency department with complaints of difficulty breathing and leg swelling.  He reported that his left lower extremity was swollen, red and also had some ulcers.  Patient also found to have ulcers  on left lower extremity.  He follows with Dr. Lysbeth Galas ,[facility] wound [facility]. Patient admitted for further management of left lower extremity cellulitis.  Currently he is being treated for Pseudomonas aeruginosa bacteremia.  Assessment & Plan:   Principal Problem:   Bacteremia Active Problems:   Aortic stenosis, severe   Sleep apnea   Schizoaffective disorder, bipolar type (HCC)   Rheumatoid arthritis ([place])   Hyperlipidemia   Morbid obesity ([place])   Hypertension   Left leg DVT (HCC)   Sepsis ([place])   Cellulitis    Bacteremia: Blood cultures grew pseudomonas aeruginosa sensitive to ciprofloxacin.  He was on cefepime which has been changed to Cipro on 09/01/17.  Will change IV antibiotics to oral when he nears to the  discharge date. Repeat blood cultures sent on 09/01/17 NGTD He has remained afebrile.  White cell count stable.  Cellulitis/abscess: Wound care following.  Cellulitis of the left lower extremity continues to improve.  Erythema and inflammation decreasing . He has history of chronic status dermatitis on bilateral lower extremities. He follows with wound care as an outpatient at [facility] No abscess was seen on the CT imaging.  Negative for DVT.  Shortness of breath: Exertional.  Cud also be associated  with his severe aortic stenosis.    Severe aortic stenosis:Patient should follow-up with cardiology as an  outpatient on discharge. Patient follows with Dr. Burt Knack. Echocardiogram shows EF of 65-70%,severe LVH,severe aortic stenosis  Rheumatoid arthritis: Follows with Dr. Berna Bue.  On chronic low-dose steroids. Methotrexate and tofacitinib were held in the setting of acute infection.  He should follow-up with his rheumatologist on discharge  DVT: We will continue Eliquis  Debility/balance issues: He is wheelchair-bound. He can hardly ambulate without it.  Physical therapy evaluated the patient and recommended SNF. Social worker consuted  Sinus bradycardia: Metoprolol discontinued  Patient is medically stable to be discharged to skilled nursing facility whenever the bed is available.  DVT prophylaxis:Eliquis Code Status: Full code Family Communication: Spouse at the bedside Disposition Plan: SNF as soon as bed is available   Consultants: None  Procedures: Echocardiogram  Antimicrobials: Vanco: 2/7 - 2/8                            Cefepime: 2/8- 2/9                            Ciprofloxacin:Since  2/9   Subjective: Patient seen and examined the bedside this morning.  Remains comfortable.  Desperate to go home.  Noted to be bradycardic during sleep earlier this morning. Left lower extremity cellulitis is improving  Objective: Vitals:   09/02/17 1509 09/02/17 2148 09/03/17 0516 09/03/17 1341  BP: (!) 145/63 107/60 123/68 95/70  Pulse: (!) 56 (!) 59 (!) 40 62  Resp: 19 (!) 21 20 20   Temp: 99.1 F (37.3 C) 98.4 F (36.9 C) 98.3 F (  36.8 C) 98.1 F (36.7 C)  TempSrc: Oral Oral Axillary Oral  SpO2: 99% 96% 96% 97%  Weight:      Height:        Intake/Output Summary (Last 24 hours) at 09/03/2017 1438 Last data filed at 09/03/2017 1430 Gross per 24 hour  Intake 820 ml  Output 3100 ml  Net -2280 ml   Filed Weights   08/28/17 2343  Weight: 104 kg (229 lb 4.5 oz)    Examination:  General exam: Appears calm and comfortable ,Not in distress,obese HEENT:PERRL,Oral mucosa  moist, Ear/Nose normal on gross exam Respiratory system: Bilateral equal air entry, normal vesicular breath sounds, no wheezes or crackles  Cardiovascular system: Murmur present,S1 & S2 heard, RRR. No JVD,rubs, gallops or clicks. Gastrointestinal system: Abdomen is nondistended, soft and nontender. No organomegaly or masses felt. Normal bowel sounds heard. Central nervous system: Alert and oriented. No focal neurological deficits. Extremities: Trace edema of left lower extremity Skin:Bilateral chronic venous insufficiency, old healing ulcer near the left knee,healing ulcer present on the medial side of the left lower extremity,B/L lower extremities covered today with wraps MSK: Normal muscle bulk,tone ,power Psychiatry: Judgement and insight appear normal. Mood & affect appropriate.       Data Reviewed: I have personally reviewed following labs and imaging studies  CBC: Recent Labs  Lab 08/28/17 1511 08/29/17 0440 08/30/17 0652 08/31/17 0538 09/01/17 0604 09/02/17 0603  WBC 14.0* 10.4 6.6 5.8 5.2 5.0  NEUTROABS 12.9*  --   --   --   --   --   HGB 13.2 11.5* 11.9* 12.0* 11.9* 11.7*  HCT 39.3 34.0* 34.8* 35.4* 35.0* 34.0*  MCV 100.0 98.6 96.9 97.0 98.6 97.4  PLT 121* 111* 103* 102* 108* 161*   Basic Metabolic Panel: Recent Labs  Lab 08/29/17 0440 08/30/17 0652 08/31/17 0538 09/01/17 0604 09/02/17 0603  NA 136 136 138 139 141  K 3.8 4.1 4.3 4.2 4.0  CL 104 103 103 100* 99*  CO2 28 29 29 30 31   GLUCOSE 99 94 94 87 95  BUN 21* 19 17 17 17   CREATININE 0.58* 0.58* 0.53* 0.60* 0.54*  CALCIUM 8.0* 8.2* 8.5* 8.6* 9.0   GFR: Estimated Creatinine Clearance: 87.4 mL/min (A) (by C-G formula based on SCr of 0.54 mg/dL (L)). Liver Function Tests: Recent Labs  Lab 08/28/17 1511  AST 34  ALT 23  ALKPHOS 62  BILITOT 0.8  PROT 6.9  ALBUMIN 3.6   No results for input(s): LIPASE, AMYLASE in the last 168 hours. No results for input(s): AMMONIA in the last 168  hours. Coagulation Profile: Recent Labs  Lab 08/28/17 1511  INR 1.12   Cardiac Enzymes: Recent Labs  Lab 08/29/17 0440 08/29/17 1001 08/29/17 1602  TROPONINI 0.03* 0.03* <0.03   BNP (last 3 results) No results for input(s): PROBNP in the last 8760 hours. HbA1C: No results for input(s): HGBA1C in the last 72 hours. CBG: No results for input(s): GLUCAP in the last 168 hours. Lipid Profile: No results for input(s): CHOL, HDL, LDLCALC, TRIG, CHOLHDL, LDLDIRECT in the last 72 hours. Thyroid Function Tests: No results for input(s): TSH, T4TOTAL, FREET4, T3FREE, THYROIDAB in the last 72 hours. Anemia Panel: No results for input(s): VITAMINB12, FOLATE, FERRITIN, TIBC, IRON, RETICCTPCT in the last 72 hours. Sepsis Labs: Recent Labs  Lab 08/28/17 1525 08/28/17 1924  LATICACIDVEN 1.60 1.36    Recent Results (from the past 240 hour(s))  Culture, blood (Routine x 2)     Status: Abnormal  Collection Time: 08/28/17  3:15 PM  Result Value Ref Range Status   Specimen Description   Final    BLOOD BLOOD LEFT ARM Performed at [facility], [address] [place], [place] [address]    Special Requests   Final    BOTTLES DRAWN AEROBIC AND ANAEROBIC Blood Culture adequate volume Performed at [facility], [address] [place], [place] [address]    Culture  Setup Time   Final    GRAM NEGATIVE RODS AEROBIC BOTTLE ONLY CRITICAL VALUE NOTED.  VALUE IS CONSISTENT WITH PREVIOUSLY REPORTED AND CALLED VALUE.    Culture (A)  Final    PSEUDOMONAS AERUGINOSA SUSCEPTIBILITIES PERFORMED ON PREVIOUS CULTURE WITHIN THE LAST 5 DAYS. Performed at [facility], [place] [address], [place], [place] [address]    Report Status 08/31/2017 FINAL  Final  Culture, blood (Routine x 2)     Status: Abnormal   Collection Time: 08/28/17  3:40 PM  Result Value Ref Range Status   Specimen Description   Final    BLOOD RIGHT ANTECUBITAL Performed at [facility], [address] [place], [place] [address]    Special Requests   Final    BOTTLES DRAWN AEROBIC AND ANAEROBIC Blood Culture adequate volume Performed at [name], [address] [place], [place] [address]    Culture  Setup Time   Final    GRAM NEGATIVE RODS AEROBIC BOTTLE ONLY CRITICAL RESULT CALLED TO, READ BACK BY AND VERIFIED WITH: T. Green Pharm.D. 16:45 08/29/17 ([email or web address]) Performed at [facility], [place] [address], [place], [place] [address]    Culture PSEUDOMONAS AERUGINOSA (A)  Final   Report Status 08/31/2017 FINAL  Final   Organism ID, Bacteria PSEUDOMONAS AERUGINOSA  Final      Susceptibility   Pseudomonas aeruginosa - MIC*    CEFTAZIDIME 4 SENSITIVE Sensitive     CIPROFLOXACIN <=0.25 SENSITIVE Sensitive     GENTAMICIN 2 SENSITIVE Sensitive     IMIPENEM 1 SENSITIVE Sensitive     PIP/TAZO 8 SENSITIVE Sensitive     CEFEPIME 8 SENSITIVE Sensitive     * PSEUDOMONAS AERUGINOSA  Blood Culture ID Panel (Reflexed)     Status: Abnormal   Collection Time: 08/28/17  3:40 PM  Result Value Ref Range Status   Enterococcus species NOT DETECTED NOT DETECTED Final   Vancomycin resistance NOT DETECTED NOT DETECTED Final   Listeria monocytogenes NOT DETECTED NOT DETECTED Final   Staphylococcus species NOT DETECTED NOT DETECTED Final   Staphylococcus aureus NOT DETECTED NOT DETECTED Final   Methicillin resistance NOT DETECTED NOT DETECTED Final   Streptococcus species NOT DETECTED NOT DETECTED Final   Streptococcus agalactiae NOT DETECTED NOT DETECTED Final   Streptococcus pneumoniae NOT DETECTED NOT DETECTED Final   Streptococcus pyogenes NOT DETECTED NOT DETECTED Final   Acinetobacter baumannii NOT DETECTED NOT DETECTED Final   Enterobacteriaceae species NOT DETECTED NOT DETECTED Final   Enterobacter cloacae complex NOT DETECTED NOT DETECTED Final   Escherichia coli NOT DETECTED NOT DETECTED Final   Klebsiella oxytoca NOT DETECTED NOT DETECTED Final   Klebsiella  pneumoniae NOT DETECTED NOT DETECTED Final   Proteus species NOT DETECTED NOT DETECTED Final   Serratia marcescens NOT DETECTED NOT DETECTED Final   Carbapenem resistance NOT DETECTED NOT DETECTED Final   Haemophilus influenzae NOT DETECTED NOT DETECTED Final   Neisseria meningitidis NOT DETECTED NOT DETECTED Final   Pseudomonas aeruginosa DETECTED (  A) NOT DETECTED Final    Comment: CRITICAL RESULT CALLED TO, READ BACK BY AND VERIFIED WITH: T. Green Pharm.D. 16:45 08/29/17 ([email or web address])    Candida albicans NOT DETECTED NOT DETECTED Final   Candida glabrata NOT DETECTED NOT DETECTED Final   Candida krusei NOT DETECTED NOT DETECTED Final   Candida parapsilosis NOT DETECTED NOT DETECTED Final   Candida tropicalis NOT DETECTED NOT DETECTED Final    Comment: Performed at [facility], [place] [address] [place], [place] [address]  Culture, blood (Routine X 2) w Reflex to ID Panel     Status: None (Preliminary result)   Collection Time: 09/01/17  3:12 PM  Result Value Ref Range Status   Specimen Description   Final    BLOOD RIGHT HAND Performed at [place] [address], [place], [place] [address]    Special Requests   Final    BOTTLES DRAWN AEROBIC AND ANAEROBIC Blood Culture adequate volume   Culture   Final    NO GROWTH 2 DAYS Performed at [facility], [place] [address], [place], [place] [address]    Report Status PENDING  Incomplete  Culture, blood (Routine X 2) w Reflex to ID Panel     Status: None (Preliminary result)   Collection Time: 09/01/17  3:26 PM  Result Value Ref Range Status   Specimen Description   Final    BLOOD LEFT ARM Performed at [place] [address], [place], [place] [address]    Special Requests   Final    BOTTLES DRAWN AEROBIC AND ANAEROBIC Blood Culture results may not be optimal due to an excessive volume of blood received in culture bottles   Culture   Final    NO GROWTH 2 DAYS Performed at [facility], [place] [address], [place], [place] [address]    Report Status PENDING  Incomplete         Radiology Studies: No results found.      Scheduled Meds: . apixaban  5 mg Oral BID  . ARIPiprazole  30 mg Oral QHS  . divalproex  1,500 mg Oral QHS  . mirabegron ER  50 mg Oral Daily  . predniSONE  5 mg Oral Q breakfast  . ziprasidone  80 mg Oral Daily   Continuous Infusions: . ciprofloxacin Stopped (09/03/17 1430)     LOS: 6 days    Time spent: More than 50% of that time was spent in counseling and/or coordination of care.      Marene Lenz, MD [facility] Pager [phone]  If 7PM-7AM, please contact night-coverage [email or web address] Password [facility] 09/03/2017, 2:38 PM

## 2017-09-03 NOTE — Progress Notes (Signed)
Patient declines the use of nocturnal CPAP tonight. He states he did not tolerate it well last night and wore it only for about 15 minutes. He would, however, like to try again tomorrow night. Equipment remains at bedside. RT will continue to follow.

## 2017-09-03 NOTE — NC FL2 (Signed)
[place] LEVEL OF CARE SCREENING TOOL     IDENTIFICATION  Patient Name: Shaun Yoder Birthdate: 01-Apr-1946 Sex: male Admission Date (Current Location): 08/28/2017  [facility] [place] [facility] and [place] Number:  Herbalist and Address:  [facility],  [address], [place]      Provider Number: [id number]  Attending Physician Name and Address:  Marene Lenz, MD  Relative Name and Phone Number:       Current Level of Care: Hospital Recommended Level of Care: [place] Prior Approval Number:    Date Approved/Denied:   PASRR Number:    Discharge Plan: SNF    Current Diagnoses: Patient Active Problem List   Diagnosis Date Noted  . Bacteremia 08/31/2017  . Cellulitis 08/29/2017  . Sepsis ([place]) 08/28/2017  . Nocturnal hypoxemia 02/11/2017  . Primary osteoarthritis of left hip 12/06/2016  . Chronic right shoulder pain 12/06/2016  . Contracture of joint of right shoulder region 12/06/2016  . Chronic left hip pain 09/12/2016  . Status post bilateral knee replacements 09/12/2016  . Gait disorder 06/03/2016  . Left leg DVT ([place]) 04/04/2016  . Non-pressure chronic ulcer of right ankle with fat layer exposed ([place]) 10/25/2015  . Varicose veins of lower extremities with ulcer ([place]) 09/07/2015  . Annual physical exam 05/23/2015  . PCP NOTES >>>>>>>>>>>>>>>>> 03/28/2015  . Hypertension 03/28/2015  . Pulmonary nodules 12/09/2014  . Sleep apnea 11/14/2014  . GERD (gastroesophageal reflux disease) 11/14/2014  . Morbid obesity ([place]) 11/14/2014  . Aortic stenosis, severe   . Schizoaffective disorder, bipolar type ([place])   . Rheumatoid arthritis ([place])   . Hyperlipidemia   . Bifasicular block   . Mononeuritis of upper limb 05/20/2013  . Pain in soft tissues of limb 05/20/2013  . 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 Depression 01/13/2013  . Memory loss 01/13/2013    Orientation RESPIRATION BLADDER Height & Weight      Self, Time, Situation, Place  O2 Indwelling catheter Weight: 229 lb 4.5 oz (104 kg) Height:  5\' 1"  (154.9 cm)  BEHAVIORAL SYMPTOMS/MOOD NEUROLOGICAL BOWEL NUTRITION STATUS      Incontinent Diet(See dc summary)  AMBULATORY STATUS COMMUNICATION OF NEEDS Skin   Total Care(Wheelchair, can transfer. ) Verbally Normal                       Personal Care Assistance Level of Assistance  Bathing, Feeding, Dressing Bathing Assistance: Limited assistance Feeding assistance: Independent Dressing Assistance: Limited assistance     Functional Limitations Info  Sight, Hearing, Speech Sight Info: Adequate Hearing Info: Adequate Speech Info: Adequate    SPECIAL CARE FACTORS FREQUENCY  PT (By licensed PT), OT (By licensed OT)     PT Frequency: 5x/week OT Frequency: 5x/week            Contractures Contractures Info: Not present    Additional Factors Info  Code Status, Allergies Code Status Info: Full Allergies Info: Leflunomide, Morphine And Related, Plaquenil Hydroxychloroquine Sulfate           Current Medications (09/03/2017):  This is the current hospital active medication list Current Facility-Administered Medications  Medication Dose Route Frequency Provider Last Rate Last Dose  . apixaban (ELIQUIS) tablet 5 mg  5 mg Oral BID Emokpae, Ejiroghene E, MD   5 mg at 09/03/17 0955  . ARIPiprazole (ABILIFY) tablet 30 mg  30 mg Oral QHS Blount, Xenia T, NP   30 mg at 09/02/17 2120  . ciprofloxacin (CIPRO)  IVPB 400 mg  400 mg Intravenous Q12H Marene Lenz, MD   Stopped at 09/03/17 1430  . divalproex (DEPAKOTE ER) 24 hr tablet 1,500 mg  1,500 mg Oral QHS Emokpae, Ejiroghene E, MD   1,500 mg at 09/02/17 2119  . HYDROcodone-acetaminophen (NORCO/VICODIN) 5-325 MG per tablet 1 tablet  1 tablet Oral Q6H PRN Emokpae, Ejiroghene E, MD   1 tablet at 09/01/17 0957  . hydrocortisone cream 1 %   Topical PRN Jodie Echevaria, Amrit, MD      . mirabegron ER (MYRBETRIQ) tablet 50 mg  50 mg  Oral Daily Emokpae, Ejiroghene E, MD   50 mg at 09/03/17 0955  . polyethylene glycol (MIRALAX / GLYCOLAX) packet 17 g  17 g Oral Daily PRN Emokpae, Ejiroghene E, MD   17 g at 08/29/17 0854  . predniSONE (DELTASONE) tablet 5 mg  5 mg Oral Q breakfast Emokpae, Ejiroghene E, MD   5 mg at 09/03/17 0753  . ziprasidone (GEODON) capsule 80 mg  80 mg Oral Daily Emokpae, Ejiroghene E, MD   80 mg at 09/02/17 2120     Discharge Medications: Please see discharge summary for a list of discharge medications.  Relevant Imaging Results:  Relevant Lab Results:   Additional Information ssn: [id number]  Servando Snare, LCSW

## 2017-09-04 DIAGNOSIS — I10 Essential (primary) hypertension: Secondary | ICD-10-CM | POA: Diagnosis not present

## 2017-09-04 DIAGNOSIS — L0390 Cellulitis, unspecified: Secondary | ICD-10-CM | POA: Diagnosis not present

## 2017-09-04 DIAGNOSIS — R7881 Bacteremia: Secondary | ICD-10-CM | POA: Diagnosis not present

## 2017-09-04 DIAGNOSIS — R279 Unspecified lack of coordination: Secondary | ICD-10-CM | POA: Diagnosis not present

## 2017-09-04 DIAGNOSIS — R0602 Shortness of breath: Secondary | ICD-10-CM | POA: Diagnosis not present

## 2017-09-04 DIAGNOSIS — N183 Chronic kidney disease, stage 3 (moderate): Secondary | ICD-10-CM | POA: Diagnosis not present

## 2017-09-04 DIAGNOSIS — L03119 Cellulitis of unspecified part of limb: Secondary | ICD-10-CM | POA: Diagnosis not present

## 2017-09-04 DIAGNOSIS — J181 Lobar pneumonia, unspecified organism: Secondary | ICD-10-CM | POA: Diagnosis not present

## 2017-09-04 DIAGNOSIS — M6281 Muscle weakness (generalized): Secondary | ICD-10-CM | POA: Diagnosis not present

## 2017-09-04 DIAGNOSIS — K5900 Constipation, unspecified: Secondary | ICD-10-CM | POA: Diagnosis not present

## 2017-09-04 DIAGNOSIS — R2681 Unsteadiness on feet: Secondary | ICD-10-CM | POA: Diagnosis not present

## 2017-09-04 DIAGNOSIS — I4891 Unspecified atrial fibrillation: Secondary | ICD-10-CM | POA: Diagnosis not present

## 2017-09-04 DIAGNOSIS — M069 Rheumatoid arthritis, unspecified: Secondary | ICD-10-CM | POA: Diagnosis not present

## 2017-09-04 DIAGNOSIS — N3281 Overactive bladder: Secondary | ICD-10-CM | POA: Diagnosis not present

## 2017-09-04 DIAGNOSIS — I350 Nonrheumatic aortic (valve) stenosis: Secondary | ICD-10-CM | POA: Diagnosis not present

## 2017-09-04 DIAGNOSIS — A419 Sepsis, unspecified organism: Secondary | ICD-10-CM | POA: Diagnosis not present

## 2017-09-04 DIAGNOSIS — E785 Hyperlipidemia, unspecified: Secondary | ICD-10-CM | POA: Diagnosis not present

## 2017-09-04 DIAGNOSIS — R001 Bradycardia, unspecified: Secondary | ICD-10-CM | POA: Diagnosis not present

## 2017-09-04 DIAGNOSIS — E569 Vitamin deficiency, unspecified: Secondary | ICD-10-CM | POA: Diagnosis not present

## 2017-09-04 DIAGNOSIS — R52 Pain, unspecified: Secondary | ICD-10-CM | POA: Diagnosis not present

## 2017-09-04 DIAGNOSIS — K219 Gastro-esophageal reflux disease without esophagitis: Secondary | ICD-10-CM | POA: Diagnosis not present

## 2017-09-04 DIAGNOSIS — R4182 Altered mental status, unspecified: Secondary | ICD-10-CM | POA: Diagnosis not present

## 2017-09-04 DIAGNOSIS — L03116 Cellulitis of left lower limb: Secondary | ICD-10-CM | POA: Diagnosis not present

## 2017-09-04 DIAGNOSIS — I82402 Acute embolism and thrombosis of unspecified deep veins of left lower extremity: Secondary | ICD-10-CM | POA: Diagnosis not present

## 2017-09-04 DIAGNOSIS — F250 Schizoaffective disorder, bipolar type: Secondary | ICD-10-CM | POA: Diagnosis not present

## 2017-09-04 DIAGNOSIS — R0600 Dyspnea, unspecified: Secondary | ICD-10-CM | POA: Diagnosis not present

## 2017-09-04 DIAGNOSIS — I82409 Acute embolism and thrombosis of unspecified deep veins of unspecified lower extremity: Secondary | ICD-10-CM | POA: Diagnosis not present

## 2017-09-04 DIAGNOSIS — B965 Pseudomonas (aeruginosa) (mallei) (pseudomallei) as the cause of diseases classified elsewhere: Secondary | ICD-10-CM | POA: Diagnosis not present

## 2017-09-04 DIAGNOSIS — J90 Pleural effusion, not elsewhere classified: Secondary | ICD-10-CM | POA: Diagnosis not present

## 2017-09-04 DIAGNOSIS — Z111 Encounter for screening for respiratory tuberculosis: Secondary | ICD-10-CM | POA: Diagnosis not present

## 2017-09-04 MED ORDER — CIPROFLOXACIN HCL 500 MG PO TABS: 500.0000 mg | ORAL_TABLET | Freq: Two times a day (BID) | ORAL | 0 refills | Status: AC

## 2017-09-04 NOTE — Progress Notes (Signed)
RN called to give report to nurse supervisor at [facility] at [phone]

## 2017-09-04 NOTE — Clinical Social Work Placement (Addendum)
  PASRR pending. Patient can d/c after pasrr is received. Clinical information sent this a.m PASRR Received.  D/C Summary sent. [facility] called for transport.  Nurse given number to call report.    CLINICAL SOCIAL WORK PLACEMENT  NOTE  Date:  09/04/2017  Patient Details  Name: Shaun Yoder MRN: [id number] Date of Birth: 1945-11-09  Clinical Social Work is seeking post-discharge placement for this patient at the [place] level of care (*CSW will initial, date and re-position this form in  chart as items are completed):  Yes   Patient/family provided with [place] Work Department's list of facilities offering this level of care within the geographic area requested by the patient (or if unable, by the patient's family).  Yes   Patient/family informed of their freedom to choose among providers that offer the needed level of care, that participate in Medicare, Medicaid or managed care program needed by the patient, have an available bed and are willing to accept the patient.  Yes   Patient/family informed of [facility]'s ownership interest in [facility] and [facility], as well as of the fact that they are under no obligation to receive care at these facilities.  PASRR submitted to EDS on 09/04/17     PASRR number received on       Existing PASRR number confirmed on       FL2 transmitted to all facilities in geographic area requested by pt/family on       FL2 transmitted to all facilities within larger geographic area on 09/03/17     Patient informed that his/her managed care company has contracts with or will negotiate with certain facilities, including the following:  [facility]     Yes   Patient/family informed of bed offers received.  Patient chooses bed at [facility]     Physician recommends and patient chooses bed at      Patient to be transferred to [facility] on 09/04/17.  Patient to be transferred to facility by [facility]       Patient family notified on 09/04/17 of transfer.  Name of family member notified:  Spouse at bedside.      PHYSICIAN Please prepare priority discharge summary, including medications, Please sign FL2     Additional Comment:    _______________________________________________ Lia Hopping, LCSW 09/04/2017, 11:09 AM

## 2017-09-04 NOTE — Progress Notes (Signed)
Physical Therapy Treatment Patient Details Name: Shaun Yoder MRN: [id number] DOB: [birth year] Today's Date: 09/04/2017    History of Present Illness Patient is a 64 old male with past medical history significant for aortic stenosis, schizoaffective disorder, bipolar disorder, rheumatoid arthritis, degenerative osteoarthritis, hyperlipidemia, hypertension, left leg DVT admitted with complaints of difficulty breathing and leg swelling    PT Comments    Assisted OOB to recliner via partial pivot scoot 1/4 to pts R.  Prior pt was able to self perform.   Follow Up Recommendations  SNF;Supervision/Assistance - 24 hour     Equipment Recommendations  None recommended by PT    Recommendations for Other Services       Precautions / Restrictions Precautions Precautions: Fall Precaution Comments: bad B shoulder and bad L knee "arthritis" Restrictions Weight Bearing Restrictions: No    Mobility  Bed Mobility Overal bed mobility: Needs Assistance Bed Mobility: Supine to Sit     Supine to sit: HOB elevated;Min assist;Mod assist     General bed mobility comments: assist to lift trunk and assist to complete scooting to EOB  Transfers Overall transfer level: Needs assistance   Transfers: Stand Pivot Transfers;Sit to/from Stand Sit to Stand: Min assist         General transfer comment: 1/4 partial pivot from elevated bed to recliner   good use of hands to steady self and transition  Ambulation/Gait             General Gait Details: transfers only   Stairs            Wheelchair Mobility    Modified Rankin (Stroke Patients Only)       Balance                                            Cognition Arousal/Alertness: Awake/alert Behavior During Therapy: WFL for tasks assessed/performed Overall Cognitive Status: Within Functional Limits for tasks assessed                                        Exercises       General Comments        Pertinent Vitals/Pain Pain Assessment: Faces Faces Pain Scale: Hurts a little bit Pain Location: shoulders, L hip Pain Descriptors / Indicators: Aching    Home Living                      Prior Function            PT Goals (current goals can now be found in the care plan section) Progress towards PT goals: Progressing toward goals    Frequency    Min 3X/week      PT Plan Current plan remains appropriate    Co-evaluation              AM-PAC PT "6 Clicks" Daily Activity  Outcome Measure  Difficulty turning over in bed (including adjusting bedclothes, sheets and blankets)?: Unable Difficulty moving from lying on back to sitting on the side of the bed? : Unable Difficulty sitting down on and standing up from a chair with arms (e.g., wheelchair, bedside commode, etc,.)?: Unable Help needed moving to and from a bed to chair (including a wheelchair)?: A Lot Help needed walking in hospital  room?: Total Help needed climbing 3-5 steps with a railing? : Total 6 Click Score: 7    End of Session Equipment Utilized During Treatment: Gait belt Activity Tolerance: Patient tolerated treatment well Patient left: in chair;with call bell/phone within reach Nurse Communication: Mobility status PT Visit Diagnosis: Unsteadiness on feet (R26.81)     Time: 8416-6063 PT Time Calculation (min) (ACUTE ONLY): 17 min  Charges:  $Therapeutic Activity: 8-22 mins                    G Codes:       Rica Koyanagi  PTA [facility]  Acute  Rehab Pager      [phone]

## 2017-09-04 NOTE — Discharge Summary (Signed)
Physician Discharge Summary  Shaun Yoder TKW:[id number] DOB: November 06, 1945 DOA: 08/28/2017  PCP: Colon Branch, MD  Admit date: 08/28/2017 Discharge date: 09/04/2017  Admitted From: Home Disposition:  SNF  Discharge Condition:Stable CODE STATUS:Full Diet recommendation: Heart Healthy   Brief/Interim Summary: Patient is a 72 old male with past medical history significant for aortic stenosis, schizoaffective disorder, bipolar disorder, rheumatoid arthritis, degenerative osteoarthritis, hyperlipidemia, hypertension, left leg DVT who presents to the emergency department with complaints of difficulty breathing and leg swelling.  He reported that his left lower extremity was swollen, red and also had some ulcers.  Patient also found to have ulcers  on left lower extremity.  He follows with Dr. Lysbeth Galas ,[facility] wound care center. Patient admitted for further management of left lower extremity cellulitis.  Wound care also evaluated the patient.Blood culture came out to be positive for Pseudomonas aeruginosa sensitive to ciprofloxacin. Currently patient's overall status is stable.  Repeat blood cultured have not  not shown any growth. He continues to need wound care .Patient was also evaluated by physical therapy and recommended skilled nursing facility on discharge. Patient is medically stable to be discharged to skilled nursing facility today.  Following problems were addressed during his hospitalization:  Bacteremia: Blood cultures grew pseudomonas aeruginosa sensitive to ciprofloxacin.  He was on cefepime which has been changed to Cipro on 09/01/17.  We changed antibiotic to oral which he will continue for 7 more days. Repeat blood cultures sent on 09/01/17 NGTD He has remained afebrile.  White cell count stable.  Cellulitis/abscess: Wound care was following.  Cellulitis of the left lower extremity continues to improve.  Erythema and inflammation decreasing . He has history of chronic status  dermatitis on bilateral lower extremities. He follows with wound care as an outpatient at [facility]. No abscess was seen on the CT imaging.  Negative for DVT.  Shortness of breath: Exertional.  Cud also be associated  with his severe aortic stenosis.    Severe aortic stenosis:Patient should follow-up with cardiology as an outpatient on discharge. Patient follows with Dr. Burt Knack. Echocardiogram shows EF of 65-70%,severe LVH,severe aortic stenosis  Rheumatoid arthritis: Follows with Dr. Berna Bue.  On chronic low-dose steroids. Methotrexate and tofacitinib were held in the setting of acute infection.  He should follow-up with his rheumatologist on discharge  DVT: We will continue Eliquis  Debility/balance issues: He is wheelchair-bound. He can hardly ambulate without it.  Physical therapy evaluated the patient and recommended SNF. Social worker consuted  Sinus bradycardia: Metoprolol discontinued. [name]      Discharge Diagnoses:  Principal Problem:   Bacteremia Active Problems:   Aortic stenosis, severe   Sleep apnea   Schizoaffective disorder, bipolar type (HCC)   Rheumatoid arthritis ([place])   Hyperlipidemia   Morbid obesity ([place])   Hypertension   Left leg DVT ([place])   Sepsis ([place])   Cellulitis    Discharge Instructions  Discharge Instructions    Diet - low sodium heart healthy   Complete by:  As directed    Discharge instructions   Complete by:  As directed    1) Please take prescribed medications as instructed. 2)Check CBC and BMP in a week. 3) Follow up with your cardiologist as an outpatient in 2 weeks. 4) Follow up with outpatient Wound care center as you had been doing.   Increase activity slowly   Complete by:  As directed      Allergies as of 09/04/2017      Reactions  Leflunomide Other (See Comments)   diarrhea   Morphine And Related Nausea And Vomiting   Plaquenil [hydroxychloroquine Sulfate]    rash      Medication List    STOP taking  these medications   metoprolol succinate 25 MG 24 hr tablet Commonly known as:  TOPROL-XL     TAKE these medications   acetaminophen-codeine 300-30 MG tablet Commonly known as:  TYLENOL #3 Take 1 tablet by mouth every 4 (four) hours as needed for moderate pain.   apixaban 5 MG Tabs tablet Commonly known as:  ELIQUIS Take 1 tablet (5 mg total) by mouth 2 (two) times daily.   ARIPiprazole 30 MG tablet Commonly known as:  ABILIFY Take 30 mg by mouth at bedtime.   aspirin EC 81 MG tablet Take 81 mg by mouth daily.   CALCIUM 500/D 500-200 MG-UNIT tablet Generic drug:  calcium-vitamin D Take 1 tablet by mouth daily with breakfast.   ciprofloxacin 500 MG tablet Commonly known as:  CIPRO Take 1 tablet (500 mg total) by mouth 2 (two) times daily for 7 days.   divalproex 500 MG 24 hr tablet Commonly known as:  DEPAKOTE ER 3 tabs at bedtime   folic acid 1 MG tablet Commonly known as:  FOLVITE Take 1 mg by mouth daily.   gabapentin 300 MG capsule Commonly known as:  NEURONTIN Take 300 mg by mouth 2 (two) times daily.   hydrocortisone cream 1 % Apply on affected areas on face and behind ears twice daily for 14 days. What changed:  additional instructions   ketoconazole 2 % shampoo Commonly known as:  NIZORAL Lather and massage into scalp 2-3 times per week. Leave for 5 minutes before rinsing.   methotrexate 2.5 MG tablet Commonly known as:  RHEUMATREX Take 17.5 mg by mouth once a week. Reported on 12/06/2015   mirabegron ER 50 MG Tb24 tablet Commonly known as:  MYRBETRIQ Take 50 mg by mouth daily.   Omega-3 1000 MG Caps Take 1 capsule by mouth daily.   omeprazole 20 MG capsule Commonly known as:  PRILOSEC Take 20 mg by mouth every morning.   oxybutynin 5 MG tablet Commonly known as:  DITROPAN Take 5 mg by mouth 2 (two) times daily.   predniSONE 5 MG tablet Commonly known as:  DELTASONE Take 5 mg by mouth daily with breakfast.   trimethoprim-polymyxin b  ophthalmic solution Commonly known as:  POLYTRIM Place 1 drop into both eyes every 4 (four) hours.   vitamin C 500 MG tablet Commonly known as:  ASCORBIC ACID Take 500 mg by mouth daily.   XELJANZ XR 11 MG Tb24 Generic drug:  Tofacitinib Citrate Take 1 tablet by mouth daily.   ziprasidone 80 MG capsule Commonly known as:  GEODON Take 80 mg by mouth daily.       Contact information for follow-up providers    Colon Branch, MD. Schedule an appointment as soon as possible for a visit in 1 week(s).   Specialty:  Internal Medicine Contact information: [address] [place] [address] [phone]            Contact information for after-discharge care    Destination    [facility] Follow up.   Service:  Skilled Nursing Contact information: [address] [place] [phone]                 Allergies  Allergen Reactions  . Leflunomide Other (See Comments)  diarrhea  . Morphine And Related Nausea And Vomiting  . Plaquenil [Hydroxychloroquine Sulfate]     rash    Consultations:  None   Procedures/Studies: Dg Chest 2 View  Result Date: 08/28/2017 CLINICAL DATA:  Shortness of breath. History of urinary bladder carcinoma EXAM: CHEST  2 VIEW COMPARISON:  January 06, 2017 FINDINGS: There is bibasilar lung scarring. There is no edema or consolidation. The heart size and pulmonary vascular normal. There is aortic atherosclerosis. No adenopathy. There is advanced erosion of both proximal humeri with foci of calcification within each shoulder joint. IMPRESSION: Bibasilar lung scarring. No edema or consolidation. Stable cardiac silhouette. There is aortic atherosclerosis. No adenopathy. Fragmentation and advanced erosion of both proximal humeri noted. Aortic Atherosclerosis (ICD10-I70.0). Electronically Signed   By: Lowella Grip III M.D.   On: 08/28/2017 13:57   US Venous Img Lower Unilateral Left  Result Date:  08/28/2017 CLINICAL DATA:  72 year old male with left lower extremity discoloration and pain EXAM: LEFT LOWER EXTREMITY VENOUS DOPPLER ULTRASOUND TECHNIQUE: Gray-scale sonography with graded compression, as well as color Doppler and duplex ultrasound were performed to evaluate the lower extremity deep venous systems from the level of the common femoral vein and including the common femoral, femoral, profunda femoral, popliteal and calf veins including the posterior tibial, peroneal and gastrocnemius veins when visible. The superficial great saphenous vein was also interrogated. Spectral Doppler was utilized to evaluate flow at rest and with distal augmentation maneuvers in the common femoral, femoral and popliteal veins. COMPARISON:  None. FINDINGS: Contralateral Common Femoral Vein: Respiratory phasicity is normal and symmetric with the symptomatic side. No evidence of thrombus. Normal compressibility. Common Femoral Vein: No evidence of thrombus. Normal compressibility, respiratory phasicity and response to augmentation. Saphenofemoral Junction: No evidence of thrombus. Normal compressibility and flow on color Doppler imaging. Profunda Femoral Vein: No evidence of thrombus. Normal compressibility and flow on color Doppler imaging. Femoral Vein: No evidence of thrombus. Normal compressibility, respiratory phasicity and response to augmentation. Popliteal Vein: No evidence of thrombus. Normal compressibility, respiratory phasicity and response to augmentation. Calf Veins: No evidence of thrombus within the posterior tibial veins. The peroneal veins are not well seen. Superficial Great Saphenous Vein: No evidence of thrombus. Normal compressibility. Venous Reflux:  None. Other Findings: Superficial subcutaneous edema in the soft tissues of the left lower extremity. IMPRESSION: 1. No evidence of deep venous thrombosis to the level of the knee. Additionally, no evidence of deep venous thrombosis in the posterior tibial  vein. The peroneal veins are not well seen secondary to lower extremity edema and patient discomfort. Electronically Signed   By: Jacqulynn Cadet M.D.   On: 08/28/2017 17:08   Ct Extremity Lower Left W Contrast  Result Date: 08/28/2017 CLINICAL DATA:  Severe shortness of breath worsening over past 2 days, LEFT leg swelling increased more than usual, tremors, severe shortness of breath with minimal exertion, sore at knee being followed by wound care, extreme pain, fever of unknown origin EXAM: CT OF THE LOWER LEFT EXTREMITY WITH CONTRAST TECHNIQUE: Multidetector CT imaging of the lower left extremity was performed according to the standard protocol following intravenous contrast administration. Sagittal and coronal MPR images reconstructed from axial data set. COMPARISON:  None CONTRAST:  100 cc Isovue-300 IV FINDINGS: Bones/Joint/Cartilage Diffuse osseous demineralization. Advanced degenerative changes of LEFT hip joint. Components of a LEFT knee prosthesis are identified with a associated beam hardening artifacts and small joint effusion. Additional mild degenerative changes at the tibiotalar joint. No acute fracture, dislocation or  bone destruction. Synovial thickening with probable minimal joint effusion at LEFT hip. Ligaments Suboptimally assessed by CT. Muscles and Tendons Scattered mild muscular atrophy. Marked diffuse atrophy of the semimembranosus. No definite intramuscular edema or focal fluid collection identified. Soft tissues Increased stool in rectum. Extensive atherosclerotic calcifications in the LEFT lower extremity. Deep venous system appears grossly patent. Scattered soft tissue edema throughout the LEFT lower extremity greatest at the lateral aspect of LEFT calf. Normal sized LEFT inguinal lymph nodes. Scattered subcutaneous venous varicosities at the medial LEFT leg above and below knee. No discrete abscess collection or focal fluid collection/hematoma identified. No evidence of soft tissue  mass. IMPRESSION: Advanced degenerative changes of the LEFT hip joint with probable minimal joint effusion. Prior LEFT total knee arthroplasty with minimal joint effusion. Mild degenerative changes of the LEFT ankle. Scattered subcutaneous edema throughout the LEFT lower extremity without focal fluid collection to suggest abscess or hematoma. Atrophy of the LEFT semimembranosus muscle. Electronically Signed   By: Lavonia Dana M.D.   On: 08/28/2017 19:33      Subjective: Patient seen and examined the bedside this morning.  Remains comfortable.  No new issues/events  Discharge Exam: Vitals:   09/03/17 2019 09/04/17 0553  BP: 123/74 123/67  Pulse: 83 78  Resp: 20 18  Temp: 98.5 F (36.9 C) 98.2 F (36.8 C)  SpO2: 96% 94%   Vitals:   09/03/17 0516 09/03/17 1341 09/03/17 2019 09/04/17 0553  BP: 123/68 95/70 123/74 123/67  Pulse: (!) 40 62 83 78  Resp: 20 20 20 18   Temp: 98.3 F (36.8 C) 98.1 F (36.7 C) 98.5 F (36.9 C) 98.2 F (36.8 C)  TempSrc: Axillary Oral Oral Oral  SpO2: 96% 97% 96% 94%  Weight:      Height:        General: Pt is alert, awake, not in acute distress,obese Cardiovascular: RRR, S1/S2 +, no rubs, no gallops,murmur present Respiratory: CTA bilaterally, no wheezing, no rhonchi Abdominal: Soft, NT, ND, bowel sounds + Extremities: no edema, no cyanosis, bilateral chronic venous stasis changes, improving cellulitis on the left lower external    The results of significant diagnostics from this hospitalization (including imaging, microbiology, ancillary and laboratory) are listed below for reference.     Microbiology: Recent Results (from the past 240 hour(s))  Culture, blood (Routine x 2)     Status: Abnormal   Collection Time: 08/28/17  3:15 PM  Result Value Ref Range Status   Specimen Description   Final    BLOOD BLOOD LEFT ARM Performed at [facility], [address] [place], [place] [address]    Special Requests   Final    BOTTLES DRAWN  AEROBIC AND ANAEROBIC Blood Culture adequate volume Performed at [facility], [address] [place], [place] [address]    Culture  Setup Time   Final    GRAM NEGATIVE RODS AEROBIC BOTTLE ONLY CRITICAL VALUE NOTED.  VALUE IS CONSISTENT WITH PREVIOUSLY REPORTED AND CALLED VALUE.    Culture (A)  Final    PSEUDOMONAS AERUGINOSA SUSCEPTIBILITIES PERFORMED ON PREVIOUS CULTURE WITHIN THE LAST 5 DAYS. Performed at [facility], [place] [address], [place], [place] [address]    Report Status 08/31/2017 FINAL  Final  Culture, blood (Routine x 2)     Status: Abnormal   Collection Time: 08/28/17  3:40 PM  Result Value Ref Range Status   Specimen Description   Final    BLOOD RIGHT ANTECUBITAL Performed  at [facility], [address] [place], [place] [address]    Special Requests   Final    BOTTLES DRAWN AEROBIC AND ANAEROBIC Blood Culture adequate volume Performed at [facility], [address] [place], [place] [address]    Culture  Setup Time   Final    GRAM NEGATIVE RODS AEROBIC BOTTLE ONLY CRITICAL RESULT CALLED TO, READ BACK BY AND VERIFIED WITH: T. Green Pharm.D. 16:45 08/29/17 ([email or web address]) Performed at [facility], [place] [address], [place], [place] [address]    Culture PSEUDOMONAS AERUGINOSA (A)  Final   Report Status 08/31/2017 FINAL  Final   Organism ID, Bacteria PSEUDOMONAS AERUGINOSA  Final      Susceptibility   Pseudomonas aeruginosa - MIC*    CEFTAZIDIME 4 SENSITIVE Sensitive     CIPROFLOXACIN <=0.25 SENSITIVE Sensitive     GENTAMICIN 2 SENSITIVE Sensitive     IMIPENEM 1 SENSITIVE Sensitive     PIP/TAZO 8 SENSITIVE Sensitive     CEFEPIME 8 SENSITIVE Sensitive     * PSEUDOMONAS AERUGINOSA  Blood Culture ID Panel (Reflexed)     Status: Abnormal   Collection Time: 08/28/17  3:40 PM  Result Value Ref Range Status   Enterococcus species NOT DETECTED NOT DETECTED Final   Vancomycin resistance NOT DETECTED NOT DETECTED Final    Listeria monocytogenes NOT DETECTED NOT DETECTED Final   Staphylococcus species NOT DETECTED NOT DETECTED Final   Staphylococcus aureus NOT DETECTED NOT DETECTED Final   Methicillin resistance NOT DETECTED NOT DETECTED Final   Streptococcus species NOT DETECTED NOT DETECTED Final   Streptococcus agalactiae NOT DETECTED NOT DETECTED Final   Streptococcus pneumoniae NOT DETECTED NOT DETECTED Final   Streptococcus pyogenes NOT DETECTED NOT DETECTED Final   Acinetobacter baumannii NOT DETECTED NOT DETECTED Final   Enterobacteriaceae species NOT DETECTED NOT DETECTED Final   Enterobacter cloacae complex NOT DETECTED NOT DETECTED Final   Escherichia coli NOT DETECTED NOT DETECTED Final   Klebsiella oxytoca NOT DETECTED NOT DETECTED Final   Klebsiella pneumoniae NOT DETECTED NOT DETECTED Final   Proteus species NOT DETECTED NOT DETECTED Final   Serratia marcescens NOT DETECTED NOT DETECTED Final   Carbapenem resistance NOT DETECTED NOT DETECTED Final   Haemophilus influenzae NOT DETECTED NOT DETECTED Final   Neisseria meningitidis NOT DETECTED NOT DETECTED Final   Pseudomonas aeruginosa DETECTED (A) NOT DETECTED Final    Comment: CRITICAL RESULT CALLED TO, READ BACK BY AND VERIFIED WITH: T. Green Pharm.D. 16:45 08/29/17 ([email or web address])    Candida albicans NOT DETECTED NOT DETECTED Final   Candida glabrata NOT DETECTED NOT DETECTED Final   Candida krusei NOT DETECTED NOT DETECTED Final   Candida parapsilosis NOT DETECTED NOT DETECTED Final   Candida tropicalis NOT DETECTED NOT DETECTED Final    Comment: Performed at [facility], [place] [address], [place], [place] [address]  Culture, blood (Routine X 2) w Reflex to ID Panel     Status: None (Preliminary result)   Collection Time: 09/01/17  3:12 PM  Result Value Ref Range Status   Specimen Description   Final    BLOOD RIGHT HAND Performed at [place] [address], [place], [place] [address]    Special Requests   Final     BOTTLES DRAWN AEROBIC AND ANAEROBIC Blood Culture adequate volume   Culture   Final    NO GROWTH 2 DAYS Performed at [facility], [place],  [place] [address]    Report Status PENDING  Incomplete  Culture, blood (Routine X 2) w Reflex to ID Panel     Status: None (Preliminary result)   Collection Time: 09/01/17  3:26 PM  Result Value Ref Range Status   Specimen Description   Final    BLOOD LEFT ARM Performed at [facility] [address], [place], [place] [address]    Special Requests   Final    BOTTLES DRAWN AEROBIC AND ANAEROBIC Blood Culture results may not be optimal due to an excessive volume of blood received in culture bottles   Culture   Final    NO GROWTH 2 DAYS Performed at [facility], [place] [address], [place], [place] [address]    Report Status PENDING  Incomplete     Labs: BNP (last 3 results) No results for input(s): BNP in the last 8760 hours. Basic Metabolic Panel: Recent Labs  Lab 08/29/17 0440 08/30/17 0652 08/31/17 0538 09/01/17 0604 09/02/17 0603  NA 136 136 138 139 141  K 3.8 4.1 4.3 4.2 4.0  CL 104 103 103 100* 99*  CO2 28 29 29 30 31   GLUCOSE 99 94 94 87 95  BUN 21* 19 17 17 17   CREATININE 0.58* 0.58* 0.53* 0.60* 0.54*  CALCIUM 8.0* 8.2* 8.5* 8.6* 9.0   Liver Function Tests: Recent Labs  Lab 08/28/17 1511  AST 34  ALT 23  ALKPHOS 62  BILITOT 0.8  PROT 6.9  ALBUMIN 3.6   No results for input(s): LIPASE, AMYLASE in the last 168 hours. No results for input(s): AMMONIA in the last 168 hours. CBC: Recent Labs  Lab 08/28/17 1511 08/29/17 0440 08/30/17 4401 08/31/17 0538 09/01/17 0604 09/02/17 0603  WBC 14.0* 10.4 6.6 5.8 5.2 5.0  NEUTROABS 12.9*  --   --   --   --   --   HGB 13.2 11.5* 11.9* 12.0* 11.9* 11.7*  HCT 39.3 34.0* 34.8* 35.4* 35.0* 34.0*  MCV 100.0 98.6 96.9 97.0 98.6 97.4  PLT 121* 111* 103* 102* 108* 107*   Cardiac Enzymes: Recent Labs  Lab 08/29/17 0440  08/29/17 1001 08/29/17 1602  TROPONINI 0.03* 0.03* <0.03   BNP: Invalid input(s): POCBNP CBG: No results for input(s): GLUCAP in the last 168 hours. D-Dimer No results for input(s): DDIMER in the last 72 hours. Hgb A1c No results for input(s): HGBA1C in the last 72 hours. Lipid Profile No results for input(s): CHOL, HDL, LDLCALC, TRIG, CHOLHDL, LDLDIRECT in the last 72 hours. Thyroid function studies No results for input(s): TSH, T4TOTAL, T3FREE, THYROIDAB in the last 72 hours.  Invalid input(s): FREET3 Anemia work up No results for input(s): VITAMINB12, FOLATE, FERRITIN, TIBC, IRON, RETICCTPCT in the last 72 hours. Urinalysis    Component Value Date/Time   COLORURINE YELLOW 08/28/2017 1415   APPEARANCEUR CLEAR 08/28/2017 1415   LABSPEC 1.015 08/28/2017 1415   PHURINE 8.5 (H) 08/28/2017 1415   GLUCOSEU NEGATIVE 08/28/2017 1415   GLUCOSEU NEGATIVE 04/03/2016 1453   HGBUR TRACE (A) 08/28/2017 1415   BILIRUBINUR NEGATIVE 08/28/2017 1415   BILIRUBINUR neg 04/03/2016 1549   KETONESUR NEGATIVE 08/28/2017 1415   PROTEINUR NEGATIVE 08/28/2017 1415   UROBILINOGEN 1.0 04/03/2016 1549   UROBILINOGEN 2.0 (A) 04/03/2016 1453   NITRITE NEGATIVE 08/28/2017 1415   LEUKOCYTESUR NEGATIVE 08/28/2017 1415   Sepsis Labs Invalid input(s): PROCALCITONIN,  WBC,  LACTICIDVEN Microbiology Recent Results (from the past 240 hour(s))  Culture, blood (Routine x 2)  Status: Abnormal   Collection Time: 08/28/17  3:15 PM  Result Value Ref Range Status   Specimen Description   Final    BLOOD BLOOD LEFT ARM Performed at [facility], [address] [place], [place] [address]    Special Requests   Final    BOTTLES DRAWN AEROBIC AND ANAEROBIC Blood Culture adequate volume Performed at [facility], [address] [place], [place] [address]    Culture  Setup Time   Final    GRAM NEGATIVE RODS AEROBIC BOTTLE ONLY CRITICAL VALUE NOTED.  VALUE IS CONSISTENT WITH PREVIOUSLY  REPORTED AND CALLED VALUE.    Culture (A)  Final    PSEUDOMONAS AERUGINOSA SUSCEPTIBILITIES PERFORMED ON PREVIOUS CULTURE WITHIN THE LAST 5 DAYS. Performed at [facility], [place] [address] [place], [place] [address]    Report Status 08/31/2017 FINAL  Final  Culture, blood (Routine x 2)     Status: Abnormal   Collection Time: 08/28/17  3:40 PM  Result Value Ref Range Status   Specimen Description   Final    BLOOD RIGHT ANTECUBITAL Performed at [facility], [address] [place], [place] [address]    Special Requests   Final    BOTTLES DRAWN AEROBIC AND ANAEROBIC Blood Culture adequate volume Performed at [facility], [address] [place], [place] [address]    Culture  Setup Time   Final    GRAM NEGATIVE RODS AEROBIC BOTTLE ONLY CRITICAL RESULT CALLED TO, READ BACK BY AND VERIFIED WITH: T. Green Pharm.D. 16:45 08/29/17 ([email or web address]) Performed at [facility], [place] [address], [place], [place] [address]    Culture PSEUDOMONAS AERUGINOSA (A)  Final   Report Status 08/31/2017 FINAL  Final   Organism ID, Bacteria PSEUDOMONAS AERUGINOSA  Final      Susceptibility   Pseudomonas aeruginosa - MIC*    CEFTAZIDIME 4 SENSITIVE Sensitive     CIPROFLOXACIN <=0.25 SENSITIVE Sensitive     GENTAMICIN 2 SENSITIVE Sensitive     IMIPENEM 1 SENSITIVE Sensitive     PIP/TAZO 8 SENSITIVE Sensitive     CEFEPIME 8 SENSITIVE Sensitive     * PSEUDOMONAS AERUGINOSA  Blood Culture ID Panel (Reflexed)     Status: Abnormal   Collection Time: 08/28/17  3:40 PM  Result Value Ref Range Status   Enterococcus species NOT DETECTED NOT DETECTED Final   Vancomycin resistance NOT DETECTED NOT DETECTED Final   Listeria monocytogenes NOT DETECTED NOT DETECTED Final   Staphylococcus species NOT DETECTED NOT DETECTED Final   Staphylococcus aureus NOT DETECTED NOT DETECTED Final   Methicillin resistance NOT DETECTED NOT DETECTED Final   Streptococcus species NOT DETECTED NOT DETECTED  Final   Streptococcus agalactiae NOT DETECTED NOT DETECTED Final   Streptococcus pneumoniae NOT DETECTED NOT DETECTED Final   Streptococcus pyogenes NOT DETECTED NOT DETECTED Final   Acinetobacter baumannii NOT DETECTED NOT DETECTED Final   Enterobacteriaceae species NOT DETECTED NOT DETECTED Final   Enterobacter cloacae complex NOT DETECTED NOT DETECTED Final   Escherichia coli NOT DETECTED NOT DETECTED Final   Klebsiella oxytoca NOT DETECTED NOT DETECTED Final   Klebsiella pneumoniae NOT DETECTED NOT DETECTED Final   Proteus species NOT DETECTED NOT DETECTED Final   Serratia marcescens NOT DETECTED NOT DETECTED Final   Carbapenem resistance NOT DETECTED NOT DETECTED Final   Haemophilus influenzae NOT DETECTED NOT DETECTED Final   Neisseria meningitidis NOT DETECTED NOT DETECTED Final  Pseudomonas aeruginosa DETECTED (A) NOT DETECTED Final    Comment: CRITICAL RESULT CALLED TO, READ BACK BY AND VERIFIED WITH: T. Green Pharm.D. 16:45 08/29/17 ([email or web address])    Candida albicans NOT DETECTED NOT DETECTED Final   Candida glabrata NOT DETECTED NOT DETECTED Final   Candida krusei NOT DETECTED NOT DETECTED Final   Candida parapsilosis NOT DETECTED NOT DETECTED Final   Candida tropicalis NOT DETECTED NOT DETECTED Final    Comment: Performed at [facility], [place] [address] [place], [place] [address]  Culture, blood (Routine X 2) w Reflex to ID Panel     Status: None (Preliminary result)   Collection Time: 09/01/17  3:12 PM  Result Value Ref Range Status   Specimen Description   Final    BLOOD RIGHT HAND Performed at [facility] [address] [place], [place] [address]    Special Requests   Final    BOTTLES DRAWN AEROBIC AND ANAEROBIC Blood Culture adequate volume   Culture   Final    NO GROWTH 2 DAYS Performed at [facility], [place] [address] [place], [place] [address]    Report Status PENDING  Incomplete  Culture, blood (Routine X 2) w Reflex to ID Panel      Status: None (Preliminary result)   Collection Time: 09/01/17  3:26 PM  Result Value Ref Range Status   Specimen Description   Final    BLOOD LEFT ARM Performed at [facility] [address], [place], [place] [address]    Special Requests   Final    BOTTLES DRAWN AEROBIC AND ANAEROBIC Blood Culture results may not be optimal due to an excessive volume of blood received in culture bottles   Culture   Final    NO GROWTH 2 DAYS Performed at [facility], [place] [address], [place], [place] [address]    Report Status PENDING  Incomplete     Time coordinating discharge: Over 30 minutes  SIGNED:   Marene Lenz, MD  [facility] 09/04/2017, 1:38 PM Pager [phone]  If 7PM-7AM, please contact night-coverage [email or web address] Password [email or web address]

## 2017-09-04 NOTE — Progress Notes (Addendum)
RN called to give report to [facility]; could not reach to RN; left message with phone number at [phone].  Patient has discharged to [facility] for rehab; discharge instructions including medications and appointment was given to patient; family member at bedside; patient has no question at this time. SW is notified

## 2017-09-05 DIAGNOSIS — L0390 Cellulitis, unspecified: Secondary | ICD-10-CM | POA: Diagnosis not present

## 2017-09-05 DIAGNOSIS — I82409 Acute embolism and thrombosis of unspecified deep veins of unspecified lower extremity: Secondary | ICD-10-CM | POA: Diagnosis not present

## 2017-09-05 DIAGNOSIS — R7881 Bacteremia: Secondary | ICD-10-CM | POA: Diagnosis not present

## 2017-09-05 DIAGNOSIS — R52 Pain, unspecified: Secondary | ICD-10-CM | POA: Diagnosis not present

## 2017-09-05 DIAGNOSIS — M6281 Muscle weakness (generalized): Secondary | ICD-10-CM | POA: Diagnosis not present

## 2017-09-05 DIAGNOSIS — M069 Rheumatoid arthritis, unspecified: Secondary | ICD-10-CM | POA: Diagnosis not present

## 2017-09-05 DIAGNOSIS — R001 Bradycardia, unspecified: Secondary | ICD-10-CM | POA: Diagnosis not present

## 2017-09-05 DIAGNOSIS — I10 Essential (primary) hypertension: Secondary | ICD-10-CM | POA: Diagnosis not present

## 2017-09-05 DIAGNOSIS — R0602 Shortness of breath: Secondary | ICD-10-CM | POA: Diagnosis not present

## 2017-09-05 DIAGNOSIS — I350 Nonrheumatic aortic (valve) stenosis: Secondary | ICD-10-CM | POA: Diagnosis not present

## 2017-09-05 DIAGNOSIS — B965 Pseudomonas (aeruginosa) (mallei) (pseudomallei) as the cause of diseases classified elsewhere: Secondary | ICD-10-CM | POA: Diagnosis not present

## 2017-09-05 DIAGNOSIS — E785 Hyperlipidemia, unspecified: Secondary | ICD-10-CM | POA: Diagnosis not present

## 2017-09-06 LAB — CULTURE, BLOOD (ROUTINE X 2)
Culture: NO GROWTH
Culture: NO GROWTH
SPECIAL REQUESTS: ADEQUATE

## 2017-09-08 DIAGNOSIS — L0390 Cellulitis, unspecified: Secondary | ICD-10-CM | POA: Diagnosis not present

## 2017-09-08 DIAGNOSIS — E785 Hyperlipidemia, unspecified: Secondary | ICD-10-CM | POA: Diagnosis not present

## 2017-09-08 DIAGNOSIS — M6281 Muscle weakness (generalized): Secondary | ICD-10-CM | POA: Diagnosis not present

## 2017-09-08 DIAGNOSIS — R52 Pain, unspecified: Secondary | ICD-10-CM | POA: Diagnosis not present

## 2017-09-08 DIAGNOSIS — I82409 Acute embolism and thrombosis of unspecified deep veins of unspecified lower extremity: Secondary | ICD-10-CM | POA: Diagnosis not present

## 2017-09-08 DIAGNOSIS — R0602 Shortness of breath: Secondary | ICD-10-CM | POA: Diagnosis not present

## 2017-09-08 DIAGNOSIS — M069 Rheumatoid arthritis, unspecified: Secondary | ICD-10-CM | POA: Diagnosis not present

## 2017-09-08 DIAGNOSIS — B965 Pseudomonas (aeruginosa) (mallei) (pseudomallei) as the cause of diseases classified elsewhere: Secondary | ICD-10-CM | POA: Diagnosis not present

## 2017-09-08 DIAGNOSIS — R001 Bradycardia, unspecified: Secondary | ICD-10-CM | POA: Diagnosis not present

## 2017-09-08 DIAGNOSIS — I350 Nonrheumatic aortic (valve) stenosis: Secondary | ICD-10-CM | POA: Diagnosis not present

## 2017-09-08 DIAGNOSIS — I10 Essential (primary) hypertension: Secondary | ICD-10-CM | POA: Diagnosis not present

## 2017-09-08 DIAGNOSIS — R7881 Bacteremia: Secondary | ICD-10-CM | POA: Diagnosis not present

## 2017-09-10 DIAGNOSIS — I350 Nonrheumatic aortic (valve) stenosis: Secondary | ICD-10-CM | POA: Diagnosis not present

## 2017-09-10 DIAGNOSIS — M069 Rheumatoid arthritis, unspecified: Secondary | ICD-10-CM | POA: Diagnosis not present

## 2017-09-10 DIAGNOSIS — I10 Essential (primary) hypertension: Secondary | ICD-10-CM | POA: Diagnosis not present

## 2017-09-10 DIAGNOSIS — E785 Hyperlipidemia, unspecified: Secondary | ICD-10-CM | POA: Diagnosis not present

## 2017-09-10 DIAGNOSIS — R7881 Bacteremia: Secondary | ICD-10-CM | POA: Diagnosis not present

## 2017-09-10 DIAGNOSIS — L0390 Cellulitis, unspecified: Secondary | ICD-10-CM | POA: Diagnosis not present

## 2017-09-10 DIAGNOSIS — B965 Pseudomonas (aeruginosa) (mallei) (pseudomallei) as the cause of diseases classified elsewhere: Secondary | ICD-10-CM | POA: Diagnosis not present

## 2017-09-10 DIAGNOSIS — R001 Bradycardia, unspecified: Secondary | ICD-10-CM | POA: Diagnosis not present

## 2017-09-10 DIAGNOSIS — I82409 Acute embolism and thrombosis of unspecified deep veins of unspecified lower extremity: Secondary | ICD-10-CM | POA: Diagnosis not present

## 2017-09-10 DIAGNOSIS — R0602 Shortness of breath: Secondary | ICD-10-CM | POA: Diagnosis not present

## 2017-09-10 DIAGNOSIS — M6281 Muscle weakness (generalized): Secondary | ICD-10-CM | POA: Diagnosis not present

## 2017-09-10 DIAGNOSIS — R52 Pain, unspecified: Secondary | ICD-10-CM | POA: Diagnosis not present

## 2017-09-11 DIAGNOSIS — R001 Bradycardia, unspecified: Secondary | ICD-10-CM | POA: Diagnosis not present

## 2017-09-11 DIAGNOSIS — E785 Hyperlipidemia, unspecified: Secondary | ICD-10-CM | POA: Diagnosis not present

## 2017-09-11 DIAGNOSIS — R0602 Shortness of breath: Secondary | ICD-10-CM | POA: Diagnosis not present

## 2017-09-11 DIAGNOSIS — I10 Essential (primary) hypertension: Secondary | ICD-10-CM | POA: Diagnosis not present

## 2017-09-11 DIAGNOSIS — B965 Pseudomonas (aeruginosa) (mallei) (pseudomallei) as the cause of diseases classified elsewhere: Secondary | ICD-10-CM | POA: Diagnosis not present

## 2017-09-11 DIAGNOSIS — I82409 Acute embolism and thrombosis of unspecified deep veins of unspecified lower extremity: Secondary | ICD-10-CM | POA: Diagnosis not present

## 2017-09-11 DIAGNOSIS — M6281 Muscle weakness (generalized): Secondary | ICD-10-CM | POA: Diagnosis not present

## 2017-09-11 DIAGNOSIS — R52 Pain, unspecified: Secondary | ICD-10-CM | POA: Diagnosis not present

## 2017-09-11 DIAGNOSIS — I350 Nonrheumatic aortic (valve) stenosis: Secondary | ICD-10-CM | POA: Diagnosis not present

## 2017-09-11 DIAGNOSIS — M069 Rheumatoid arthritis, unspecified: Secondary | ICD-10-CM | POA: Diagnosis not present

## 2017-09-11 DIAGNOSIS — R7881 Bacteremia: Secondary | ICD-10-CM | POA: Diagnosis not present

## 2017-09-11 DIAGNOSIS — L0390 Cellulitis, unspecified: Secondary | ICD-10-CM | POA: Diagnosis not present

## 2017-09-15 ENCOUNTER — Ambulatory Visit: Payer: Medicare Other | Admitting: Cardiology

## 2017-09-15 DIAGNOSIS — L97222 Non-pressure chronic ulcer of left calf with fat layer exposed: Secondary | ICD-10-CM | POA: Diagnosis not present

## 2017-09-15 DIAGNOSIS — I890 Lymphedema, not elsewhere classified: Secondary | ICD-10-CM | POA: Diagnosis not present

## 2017-09-15 DIAGNOSIS — L97512 Non-pressure chronic ulcer of other part of right foot with fat layer exposed: Secondary | ICD-10-CM | POA: Diagnosis not present

## 2017-09-15 DIAGNOSIS — I87311 Chronic venous hypertension (idiopathic) with ulcer of right lower extremity: Secondary | ICD-10-CM | POA: Diagnosis not present

## 2017-09-15 DIAGNOSIS — L97212 Non-pressure chronic ulcer of right calf with fat layer exposed: Secondary | ICD-10-CM | POA: Diagnosis not present

## 2017-09-15 DIAGNOSIS — I872 Venous insufficiency (chronic) (peripheral): Secondary | ICD-10-CM | POA: Diagnosis not present

## 2017-09-15 DIAGNOSIS — L97822 Non-pressure chronic ulcer of other part of left lower leg with fat layer exposed: Secondary | ICD-10-CM | POA: Diagnosis not present

## 2017-09-22 ENCOUNTER — Encounter: Payer: Self-pay | Admitting: Internal Medicine

## 2017-09-22 ENCOUNTER — Ambulatory Visit (INDEPENDENT_AMBULATORY_CARE_PROVIDER_SITE_OTHER): Payer: Medicare Other | Admitting: Internal Medicine

## 2017-09-22 VITALS — BP 148/88 | HR 86 | Resp 20 | Ht 61.0 in | Wt 227.2 lb

## 2017-09-22 DIAGNOSIS — L03119 Cellulitis of unspecified part of limb: Secondary | ICD-10-CM

## 2017-09-22 DIAGNOSIS — I10 Essential (primary) hypertension: Secondary | ICD-10-CM

## 2017-09-22 DIAGNOSIS — M7989 Other specified soft tissue disorders: Secondary | ICD-10-CM

## 2017-09-22 MED ORDER — FUROSEMIDE 20 MG PO TABS: 20.0000 mg | ORAL_TABLET | Freq: Every day | ORAL | 1 refills | Status: DC

## 2017-09-22 NOTE — Patient Instructions (Signed)
GO TO THE LAB : Get the blood work     GO TO THE FRONT DESK Schedule an appointment for blood work only 2 weeks from today Schedule your next appointment for a checkup with me in 4 weeks  Start Lasix, 1 every morning, will help you get rid of the fluid  Keep both legs elevated above your hip for as long as you can throughout the day   Check the  blood pressure 2 or 3 times a week   Be sure your blood pressure is between 110/65 and  135/85. If it is consistently higher or lower, let me know

## 2017-09-22 NOTE — Progress Notes (Signed)
Subjective:    Patient ID: Shaun Yoder, male    DOB: 1946/05/07, 72 y.o.   MRN: [id number]  DOS:  09/22/2017 Type of visit - description : Follow-up Interval history:  Since the last office visit was admitted to the hospital 08/28/2017 for 1 week. He was seen with difficulty breathing and leg swelling, he was found to have ulcers and the left leg and admitted with the diagnosis of cellulitis. Blood culture showed Pseudomonas sensitive to ciprofloxacin. Repeated blood cultures negative.  He was treated with antibiotics, improved, was discharged to rehab facility, eventually left after 1 week although he was recommended to stay for more time.  He is here for a checkup. Medications list reviewed I noticed he continued to have left leg swelling, the patient reports that that has been the case since he left the hospital although the swelling has been slightly worse in the last 4-5 days and the wife has noticed some watery blisters coming out in the pretibial area.  Has noted some ecchymoses on the sides, see graphic.  Review of Systems Denies fever chills   Past Medical History:  Diagnosis Date  . Anemia   . Cancer ([place])    bladder  . Colitis   . Colon polyps   . Depression   . Diverticulosis   . DVT of deep femoral vein ([place])   . Gastritis   . GERD (gastroesophageal reflux disease)   . History of rectal polyps   . Hyperlipidemia   . Hypertension   . Iron deficiency   . Low back pain   . OA (osteoarthritis)   . Osteonecrosis of shoulder region ([place])   . Schizoaffective disorder, bipolar type ([place])   . Sleep apnea   . Varicose vein     Past Surgical History:  Procedure Laterality Date  . BLADDER SURGERY    . TOTAL KNEE ARTHROPLASTY Bilateral   . VARICOSE VEIN SURGERY      Social History   Socioeconomic History  . Marital status: Married    Spouse name: Not on file  . Number of children: 2  . Years of education: Not on file  . Highest education level: Not  on file  Social Needs  . Financial resource strain: Not on file  . Food insecurity - worry: Not on file  . Food insecurity - inability: Not on file  . Transportation needs - medical: Not on file  . Transportation needs - non-medical: Not on file  Occupational History  . Occupation: retired  Tobacco Use  . Smoking status: Former Smoker    Packs/day: 2.50    Years: 43.00    Pack years: 107.50    Types: Cigarettes    Last attempt to quit: 07/22/1998    Years since quitting: 19.1  . Smokeless tobacco: Never Used  Substance and Sexual Activity  . Alcohol use: Yes    Alcohol/week: 0.0 oz    Comment: Occasional glass of wine  . Drug use: No  . Sexual activity: Not on file  Other Topics Concern  . Not on file  Social History Narrative   Originally from [place]. Moved to [facility] in July 22, 1985. 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 No known asbestos exposure. 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 No international travel. Has a dog currently. Remote exposure to Cockatiel in  a different home. No mold exposure. No hot tub exposure.       Two adopted children      Allergies as of 09/22/2017      Reactions   Leflunomide Other (See Comments)   diarrhea   Morphine And Related Nausea And Vomiting   Plaquenil [hydroxychloroquine Sulfate]    rash      Medication List        Accurate as of 09/22/17 11:59 PM. Always use your most recent med list.          acetaminophen-codeine 300-30 MG tablet Commonly known as:  TYLENOL #3 Take 1 tablet by mouth every 4 (four) hours as needed for moderate pain.   apixaban 5 MG Tabs tablet Commonly known as:  ELIQUIS Take 1 tablet (5 mg total) by mouth 2 (two) times daily.   ARIPiprazole 30 MG tablet Commonly known as:  ABILIFY Take 30 mg by mouth at bedtime.   aspirin EC 81 MG tablet Take  81 mg by mouth daily.   CALCIUM 500/D 500-200 MG-UNIT tablet Generic drug:  calcium-vitamin D Take 1 tablet by mouth daily with breakfast.   divalproex 500 MG 24 hr tablet Commonly known as:  DEPAKOTE ER 3 tabs at bedtime   folic acid 1 MG tablet Commonly known as:  FOLVITE Take 1 mg by mouth daily.   furosemide 20 MG tablet Commonly known as:  LASIX Take 1 tablet (20 mg total) by mouth daily.   hydrocortisone cream 1 % Apply on affected areas on face and behind ears twice daily for 14 days.   ketoconazole 2 % shampoo Commonly known as:  NIZORAL Lather and massage into scalp 2-3 times per week. Leave for 5 minutes before rinsing.   methotrexate 2.5 MG tablet Commonly known as:  RHEUMATREX Take 17.5 mg by mouth once a week. Reported on 12/06/2015   mirabegron ER 50 MG Tb24 tablet Commonly known as:  MYRBETRIQ Take 50 mg by mouth daily.   Omega-3 1000 MG Caps Take 1 capsule by mouth daily.   omeprazole 20 MG capsule Commonly known as:  PRILOSEC Take 20 mg by mouth every morning.   oxybutynin 5 MG tablet Commonly known as:  DITROPAN Take 5 mg by mouth 2 (two) times daily.   predniSONE 5 MG tablet Commonly known as:  DELTASONE Take 5 mg by mouth daily with breakfast.   trimethoprim-polymyxin b ophthalmic solution Commonly known as:  POLYTRIM Place 1 drop into both eyes every 4 (four) hours.   vitamin C 500 MG tablet Commonly known as:  ASCORBIC ACID Take 500 mg by mouth daily.   XELJANZ XR 11 MG Tb24 Generic drug:  Tofacitinib Citrate Take 1 tablet by mouth daily.   ziprasidone 80 MG capsule Commonly known as:  GEODON Take 80 mg by mouth daily.          Objective:   Physical Exam  Skin:      BP (!) 148/88 (BP Location: Left Arm, Patient Position: Sitting, Cuff Size: Normal)   Pulse 86   Resp 20   Ht 5\' 1"  (1.549 m)   Wt 227 lb 3.2 oz (103.1 kg)   SpO2 96%   BMI 42.93 kg/m  General:   Well developed, overweight appearing, sitting in a  wheelchair, nontoxic appearing.  HEENT:  Normocephalic . Face symmetric, atraumatic Lungs:  CTA B Normal respiratory effort, no intercostal retractions, no accessory muscle use. Heart: RRR, significant systolic murmur.  Right leg: Trace pitting pretibial edema Left  leg: + to ++/ +++ pretibial edema L calf is nontender but 2 inches larger in circumference.  Feels somewhat tight. Skin: Skin from the left knee down hyperpigmented, has a few open sores, but most of them are fully covered.  No odor. Neurologic:  alert & oriented X3.  Speech normal, gait not tested Psych--  Cognition and judgment appear intact.  Cooperative with normal attention span and concentration.  Behavior appropriate. No anxious or depressed appearing.      Assessment & Plan:    Assessment   (new pt, transfer from [facility],02-2015) Hypertension Hyperlipidemia Ao stenosis, severe: Dr Burt Knack MSK: --Seronegative Rheumatoid arthritis-- Dr Lenna Gilford  --DJD-- DR Tamera Punt , dr Ronnie Derby --low back pain d/t DJD, Dr Sherlyn Lick  PSYCH: Schizophrenia, bipolar, depression: Follow-up at the [place] in [facility] GU: Bladder cancer-transitional cell, Dr. Shona Needles GI:   GERD, history of colitis, history of gastritis,  colon polyps- had a virtual cscope 2016, + polyps, declined further eval Pulmomany: Dr Ashok Cordia --Sleep apnea:  Cpap intolerant --CT nodules f/u 2 pulmonary  --hypoxemia, dx 01-2017, unable to get O2 from medicare, eventually got O2 home-portable ~08-2017 L LEG DVT 02-2016 Varicose veins: s/p remote surgery, has LE wounds  [facility], [facility], Ortho, pain mngmt   PLAN  Left leg swelling: S/p  admission to the hospital with cellulitis, had bacteremia, was treated with antibiotics, he remains afebrile. I am somewhat concerned about the left leg, it is swollen, according to the patient's wife is not much worse than when he was in the hospital.  He did have a negative [place] for DVT left leg  while in the hospital and he is anticoagulated thus I won't  repeat a ultrasound today. Plan: Leg elevation, start Lasix, BMP in 2 weeks. To see the wound care center tomorrow, will let them decide if he needs antibiotics HTN: Not on metoprolol, BP slightly elevated, we started Lasix, recommend ambulatory BPs.  Check a BMP and CBC Ecchymosis: Located on the sides, he is anticoagulated,  no major problems with back or abdominal pain, low suspicious for retroperitoneal bleed, will check a CBC, if sharp decline on hemoglobin noted, further eval might be needed. RTC 2 weeks for a BMP, 4 weeks for office visit.

## 2017-09-23 ENCOUNTER — Telehealth: Payer: Self-pay | Admitting: Pulmonary Disease

## 2017-09-23 DIAGNOSIS — I872 Venous insufficiency (chronic) (peripheral): Secondary | ICD-10-CM | POA: Diagnosis not present

## 2017-09-23 DIAGNOSIS — L97212 Non-pressure chronic ulcer of right calf with fat layer exposed: Secondary | ICD-10-CM | POA: Diagnosis not present

## 2017-09-23 DIAGNOSIS — I87311 Chronic venous hypertension (idiopathic) with ulcer of right lower extremity: Secondary | ICD-10-CM | POA: Diagnosis not present

## 2017-09-23 DIAGNOSIS — I890 Lymphedema, not elsewhere classified: Secondary | ICD-10-CM | POA: Diagnosis not present

## 2017-09-23 LAB — BASIC METABOLIC PANEL
BUN: 22 mg/dL (ref 6–23)
CHLORIDE: 103 meq/L (ref 96–112)
CO2: 29 meq/L (ref 19–32)
CREATININE: 0.56 mg/dL (ref 0.40–1.50)
Calcium: 9.3 mg/dL (ref 8.4–10.5)
GFR: 152.73 mL/min (ref >60.00)
Glucose, Bld: 77 mg/dL (ref 70–99)
Potassium: 4.3 mEq/L (ref 3.5–5.1)
SODIUM: 138 meq/L (ref 135–145)

## 2017-09-23 LAB — CBC WITH DIFFERENTIAL/PLATELET
BASOS PCT: 0.5 % (ref 0.0–3.0)
Basophils Absolute: 0 10*3/uL (ref 0.0–0.1)
Eosinophils Absolute: 0 10*3/uL (ref 0.0–0.7)
Eosinophils Relative: 0.2 % (ref 0.0–5.0)
HEMATOCRIT: 40.4 % (ref 39.0–52.0)
Hemoglobin: 13.4 g/dL (ref 13.0–17.0)
LYMPHS PCT: 22.1 % (ref 12.0–46.0)
Lymphs Abs: 1 10*3/uL (ref 0.7–4.0)
MCHC: 33.1 g/dL (ref 30.0–36.0)
MCV: 98.8 fl (ref 78.0–100.0)
MONOS PCT: 6.9 % (ref 3.0–12.0)
Monocytes Absolute: 0.3 10*3/uL (ref 0.1–1.0)
NEUTROS ABS: 3.1 10*3/uL (ref 1.4–7.7)
Neutrophils Relative %: 70.3 % (ref 43.0–77.0)
PLATELETS: 203 10*3/uL (ref 150.0–400.0)
RBC: 4.09 Mil/uL — ABNORMAL LOW (ref 4.22–5.81)
RDW: 15.6 % — AB (ref 11.5–15.5)
WBC: 4.5 10*3/uL (ref 4.0–10.5)

## 2017-09-23 NOTE — Telephone Encounter (Signed)
ATC pt's wife, no answer. Left message for pt's wife to call back.

## 2017-09-23 NOTE — Telephone Encounter (Signed)
Review of download -he is on auto settings 10-15 cm with average pressure of 14.4 cm. CPAP seems to work well when used but compliance is poor. If he feels that he needs higher pressure we can change him to fixed pressure of 14 or 15 cm

## 2017-09-23 NOTE — Assessment & Plan Note (Signed)
Left leg swelling: S/p  admission to the hospital with cellulitis, had bacteremia, was treated with antibiotics, he remains afebrile. I am somewhat concerned about the left leg, it is swollen, according to the patient's wife is not much worse than when he was in the hospital.  He did have a negative [place] for DVT left leg while in the hospital and he is anticoagulated thus I won't  repeat a ultrasound today. Plan: Leg elevation, start Lasix, BMP in 2 weeks. To see the wound care center tomorrow, will let them decide if he needs antibiotics HTN: Not on metoprolol, BP slightly elevated, we started Lasix, recommend ambulatory BPs.  Check a BMP and CBC Ecchymosis: Located on the sides, he is anticoagulated,  no major problems with back or abdominal pain, low suspicious for retroperitoneal bleed, will check a CBC, if sharp decline on hemoglobin noted, further eval might be needed. RTC 2 weeks for a BMP, 4 weeks for office visit.

## 2017-09-23 NOTE — Telephone Encounter (Signed)
Spoke with pt's wife, she thinks his CPAP pressure needs to be increased. The insurance company has threatened to take the machine back. But she states she is struggling to get him to use it. He has been in rehab so he has not been using it much but when he does, he ends up taking it off. Download obtained and will give to CP for RA to review. He has an appt next week with RA. Please advise.

## 2017-09-23 NOTE — Telephone Encounter (Signed)
Pt wife returned call to resched apt w/RA. Apt moved to 3/8 at 3:15 w/TP for Cpap f/up. Cb is [phone].

## 2017-09-24 NOTE — Telephone Encounter (Signed)
lmtcb X2 for pt's wife.

## 2017-09-25 NOTE — Telephone Encounter (Signed)
lmtcb x3 for pt's wife 

## 2017-09-26 ENCOUNTER — Telehealth: Payer: Self-pay | Admitting: Internal Medicine

## 2017-09-26 ENCOUNTER — Ambulatory Visit: Payer: Medicare Other | Admitting: Adult Health

## 2017-09-26 NOTE — Telephone Encounter (Signed)
Copied from [place] [phone]. Topic: Quick Communication - See Telephone Encounter >> Sep 26, 2017 10:58 AM Hewitt Shorts wrote: CRM for notification. See Telephone encounter for: pt wife arlene is calling to let Dr. Larose Kells that the results for the wound care labs will be back Monday   Best number [phone]   09/26/17.

## 2017-09-26 NOTE — Telephone Encounter (Signed)
FYI

## 2017-09-26 NOTE — Telephone Encounter (Signed)
ATC pt, no answer. Left message for pt to call back.  

## 2017-09-26 NOTE — Telephone Encounter (Signed)
noted 

## 2017-09-29 DIAGNOSIS — F312 Bipolar disorder, current episode manic severe with psychotic features: Secondary | ICD-10-CM | POA: Diagnosis not present

## 2017-09-29 DIAGNOSIS — F310 Bipolar disorder, current episode hypomanic: Secondary | ICD-10-CM | POA: Diagnosis not present

## 2017-09-29 DIAGNOSIS — R69 Illness, unspecified: Secondary | ICD-10-CM | POA: Diagnosis not present

## 2017-09-29 DIAGNOSIS — R531 Weakness: Secondary | ICD-10-CM | POA: Diagnosis not present

## 2017-09-29 DIAGNOSIS — R45850 Homicidal ideations: Secondary | ICD-10-CM | POA: Diagnosis not present

## 2017-09-29 DIAGNOSIS — M25552 Pain in left hip: Secondary | ICD-10-CM | POA: Diagnosis not present

## 2017-09-29 DIAGNOSIS — I493 Ventricular premature depolarization: Secondary | ICD-10-CM | POA: Diagnosis not present

## 2017-09-29 DIAGNOSIS — F250 Schizoaffective disorder, bipolar type: Secondary | ICD-10-CM | POA: Diagnosis not present

## 2017-09-29 DIAGNOSIS — I4510 Unspecified right bundle-branch block: Secondary | ICD-10-CM | POA: Diagnosis not present

## 2017-09-29 DIAGNOSIS — M25562 Pain in left knee: Secondary | ICD-10-CM | POA: Diagnosis not present

## 2017-09-29 DIAGNOSIS — R269 Unspecified abnormalities of gait and mobility: Secondary | ICD-10-CM | POA: Diagnosis not present

## 2017-09-29 DIAGNOSIS — R1032 Left lower quadrant pain: Secondary | ICD-10-CM | POA: Diagnosis not present

## 2017-09-29 DIAGNOSIS — F319 Bipolar disorder, unspecified: Secondary | ICD-10-CM | POA: Diagnosis not present

## 2017-09-29 DIAGNOSIS — F22 Delusional disorders: Secondary | ICD-10-CM | POA: Diagnosis not present

## 2017-09-29 DIAGNOSIS — J9811 Atelectasis: Secondary | ICD-10-CM | POA: Diagnosis not present

## 2017-09-29 DIAGNOSIS — F329 Major depressive disorder, single episode, unspecified: Secondary | ICD-10-CM | POA: Diagnosis not present

## 2017-09-29 NOTE — Telephone Encounter (Signed)
ATC pt, no answer. Left message for pt to call back.  

## 2017-09-30 ENCOUNTER — Ambulatory Visit: Payer: Medicare Other | Admitting: Pulmonary Disease

## 2017-09-30 DIAGNOSIS — I4510 Unspecified right bundle-branch block: Secondary | ICD-10-CM | POA: Diagnosis not present

## 2017-09-30 DIAGNOSIS — J9811 Atelectasis: Secondary | ICD-10-CM | POA: Diagnosis not present

## 2017-09-30 DIAGNOSIS — I493 Ventricular premature depolarization: Secondary | ICD-10-CM | POA: Diagnosis not present

## 2017-09-30 DIAGNOSIS — F310 Bipolar disorder, current episode hypomanic: Secondary | ICD-10-CM | POA: Diagnosis not present

## 2017-09-30 NOTE — Telephone Encounter (Signed)
Spoke with wife. She stated that the patient is not currently home. He is currently in the hospital. Wife and husband are in the middle of some domestic issues. She stated that for now, leave the pressure the way that it is.   If he comes back home, she will give [place] a call back for [place] to send in order for his pressure to be changed.   Arlene verbalized understanding. Nothing else needed at time of call.

## 2017-10-01 DIAGNOSIS — M25562 Pain in left knee: Secondary | ICD-10-CM | POA: Diagnosis not present

## 2017-10-01 DIAGNOSIS — M25552 Pain in left hip: Secondary | ICD-10-CM | POA: Diagnosis not present

## 2017-10-01 DIAGNOSIS — F312 Bipolar disorder, current episode manic severe with psychotic features: Secondary | ICD-10-CM | POA: Diagnosis not present

## 2017-10-04 DIAGNOSIS — F312 Bipolar disorder, current episode manic severe with psychotic features: Secondary | ICD-10-CM | POA: Diagnosis not present

## 2017-10-05 DIAGNOSIS — R45850 Homicidal ideations: Secondary | ICD-10-CM | POA: Diagnosis not present

## 2017-10-05 DIAGNOSIS — R1032 Left lower quadrant pain: Secondary | ICD-10-CM | POA: Diagnosis not present

## 2017-10-05 DIAGNOSIS — F22 Delusional disorders: Secondary | ICD-10-CM | POA: Diagnosis not present

## 2017-10-05 DIAGNOSIS — F329 Major depressive disorder, single episode, unspecified: Secondary | ICD-10-CM | POA: Diagnosis not present

## 2017-10-05 DIAGNOSIS — F312 Bipolar disorder, current episode manic severe with psychotic features: Secondary | ICD-10-CM | POA: Diagnosis not present

## 2017-10-05 DIAGNOSIS — R531 Weakness: Secondary | ICD-10-CM | POA: Diagnosis not present

## 2017-10-06 ENCOUNTER — Other Ambulatory Visit: Payer: Medicare Other

## 2017-10-06 ENCOUNTER — Ambulatory Visit: Payer: Medicare Other | Admitting: Adult Health

## 2017-10-06 ENCOUNTER — Ambulatory Visit: Payer: Medicare Other | Admitting: Hematology & Oncology

## 2017-10-06 DIAGNOSIS — F319 Bipolar disorder, unspecified: Secondary | ICD-10-CM | POA: Diagnosis not present

## 2017-10-07 ENCOUNTER — Ambulatory Visit: Payer: Medicare Other | Admitting: Adult Health

## 2017-10-08 ENCOUNTER — Other Ambulatory Visit: Payer: Medicare Other

## 2017-10-14 ENCOUNTER — Telehealth: Payer: Self-pay | Admitting: Cardiovascular Disease

## 2017-10-14 NOTE — Telephone Encounter (Signed)
Shaun Yoder requests echo results from recent hospitalization. When asked if the patient has seen Dr. Wynonia Lawman recently, she states they have not seen him in over 2 years. She states they do not want to proceed with TAVR (why referred to Dr. Burt Knack by Dr. Wynonia Lawman).   Instructed patient's wife to call Dr. Wynonia Lawman to schedule follow-up and to review echo/plan. Informed her it is unnecessary to see Dr. Burt Knack if they do not want to proceed with TAVR. Recommended she call [facility] and speak with Medical Records to retrieve results on paper. She was grateful for assistance.

## 2017-10-14 NOTE — Telephone Encounter (Signed)
New Message   Shaun Yoder is calling on behalf of spouse. He was in the hospital at that time he had CT done but they never got the results. Spouse is wanting to obtain those results. Please call to discuss.

## 2017-10-16 ENCOUNTER — Other Ambulatory Visit (INDEPENDENT_AMBULATORY_CARE_PROVIDER_SITE_OTHER): Payer: Medicare Other

## 2017-10-16 DIAGNOSIS — Z888 Allergy status to other drugs, medicaments and biological substances status: Secondary | ICD-10-CM | POA: Diagnosis not present

## 2017-10-16 DIAGNOSIS — L97221 Non-pressure chronic ulcer of left calf limited to breakdown of skin: Secondary | ICD-10-CM | POA: Diagnosis not present

## 2017-10-16 DIAGNOSIS — I890 Lymphedema, not elsewhere classified: Secondary | ICD-10-CM | POA: Diagnosis not present

## 2017-10-16 DIAGNOSIS — I10 Essential (primary) hypertension: Secondary | ICD-10-CM

## 2017-10-16 DIAGNOSIS — L89890 Pressure ulcer of other site, unstageable: Secondary | ICD-10-CM | POA: Diagnosis not present

## 2017-10-16 DIAGNOSIS — L97512 Non-pressure chronic ulcer of other part of right foot with fat layer exposed: Secondary | ICD-10-CM | POA: Diagnosis not present

## 2017-10-16 DIAGNOSIS — Z885 Allergy status to narcotic agent status: Secondary | ICD-10-CM | POA: Diagnosis not present

## 2017-10-16 DIAGNOSIS — I87312 Chronic venous hypertension (idiopathic) with ulcer of left lower extremity: Secondary | ICD-10-CM | POA: Diagnosis not present

## 2017-10-16 DIAGNOSIS — I872 Venous insufficiency (chronic) (peripheral): Secondary | ICD-10-CM | POA: Diagnosis not present

## 2017-10-16 LAB — BASIC METABOLIC PANEL
BUN: 29 mg/dL — AB (ref 6–23)
CO2: 34 mEq/L — ABNORMAL HIGH (ref 19–32)
CREATININE: 0.64 mg/dL (ref 0.40–1.50)
Calcium: 9.2 mg/dL (ref 8.4–10.5)
Chloride: 100 mEq/L (ref 96–112)
GFR: 130.89 mL/min (ref >60.00)
GLUCOSE: 109 mg/dL — AB (ref 70–99)
Potassium: 4.1 mEq/L (ref 3.5–5.1)
Sodium: 140 mEq/L (ref 135–145)

## 2017-10-17 ENCOUNTER — Ambulatory Visit: Payer: Medicare Other | Admitting: Cardiovascular Disease

## 2017-10-17 ENCOUNTER — Other Ambulatory Visit: Payer: Self-pay

## 2017-10-17 MED ORDER — FUROSEMIDE 20 MG PO TABS: 20.0000 mg | ORAL_TABLET | Freq: Every day | ORAL | 0 refills | Status: DC

## 2017-10-22 ENCOUNTER — Ambulatory Visit (INDEPENDENT_AMBULATORY_CARE_PROVIDER_SITE_OTHER): Payer: Medicare Other | Admitting: Internal Medicine

## 2017-10-22 ENCOUNTER — Encounter: Payer: Self-pay | Admitting: Internal Medicine

## 2017-10-22 VITALS — BP 128/84 | HR 61 | Temp 97.9°F | Resp 16 | Ht 61.0 in | Wt 227.0 lb

## 2017-10-22 DIAGNOSIS — H10023 Other mucopurulent conjunctivitis, bilateral: Secondary | ICD-10-CM

## 2017-10-22 DIAGNOSIS — M05719 Rheumatoid arthritis with rheumatoid factor of unspecified shoulder without organ or systems involvement: Secondary | ICD-10-CM | POA: Diagnosis not present

## 2017-10-22 DIAGNOSIS — K5900 Constipation, unspecified: Secondary | ICD-10-CM | POA: Diagnosis not present

## 2017-10-22 DIAGNOSIS — F250 Schizoaffective disorder, bipolar type: Secondary | ICD-10-CM

## 2017-10-22 MED ORDER — CIPROFLOXACIN HCL 0.3 % OP SOLN: 1.0000 [drp] | OPHTHALMIC | 0 refills | Status: DC

## 2017-10-22 NOTE — Patient Instructions (Signed)
  GO TO THE FRONT DESK Schedule your next appointment for a checkup in 2-3 months  Constipation:  Take MiraLAX daily  Stop taking calcium supplements, okay to take vitamin D  Use enema tomorrow.  If not improving please call the office.  For pinkeye:  Use Cipro eyedrops for 5 days  Warm compress to both eyes  Call if not gradually improving

## 2017-10-22 NOTE — Progress Notes (Signed)
Subjective:    Patient ID: Shaun Yoder, male    DOB: September 02, 1945, 72 y.o.   MRN: [id number]  DOS:  10/22/2017 Type of visit - description : rov Interval history: Martin Majestic to the ER 09/29/2017, was admitted with: Depression, paranoid behavior, homicidal ideas. Eventually he improved and was discharged home Here with his wife.  Review of Systems At this point, he is still very argumentative with his wife, they are having a very difficult time. Currently with no suicidal or homicidal ideas. He is alert and oriented. Has refused to take medications for rheumatoid arthritis Refusing to use oxygen and the  CPAP. States that he did try the CPAP and simply could not tolerated. Having some discharge in both eyes for the last few days.  Think is conjunctivitis. Also complaining of on and off constipation, no nausea, vomiting or abdominal pain.  On MiraLAX most days, had a enema 4 days ago and he only produces small bowel movement.   Past Medical History:  Diagnosis Date  . Anemia   . Cancer ([place])    bladder  . Colitis   . Colon polyps   . Depression   . Diverticulosis   . DVT of deep femoral vein ([place])   . Gastritis   . GERD (gastroesophageal reflux disease)   . History of rectal polyps   . Hyperlipidemia   . Hypertension   . Iron deficiency   . Low back pain   . OA (osteoarthritis)   . Osteonecrosis of shoulder region ([place])   . Schizoaffective disorder, bipolar type ([place])   . Sleep apnea   . Varicose vein     Past Surgical History:  Procedure Laterality Date  . BLADDER SURGERY    . TOTAL KNEE ARTHROPLASTY Bilateral   . VARICOSE VEIN SURGERY      Social History   Socioeconomic History  . Marital status: Married    Spouse name: Not on file  . Number of children: 2  . Years of education: Not on file  . Highest education level: Not on file  Occupational History  . Occupation: retired  Scientific laboratory technician  . Financial resource strain: Not on file  . Food insecurity:   Worry: Not on file    Inability: Not on file  . Transportation needs:    Medical: Not on file    Non-medical: Not on file  Tobacco Use  . Smoking status: Former Smoker    Packs/day: 2.50    Years: 43.00    Pack years: 107.50    Types: Cigarettes    Last attempt to quit: 07/22/1998    Years since quitting: 19.2  . Smokeless tobacco: Never Used  Substance and Sexual Activity  . Alcohol use: Yes    Alcohol/week: 0.0 oz    Comment: Occasional glass of wine  . Drug use: No  . Sexual activity: Not on file  Lifestyle  . Physical activity:    Days per week: Not on file    Minutes per session: Not on file  . Stress: Not on file  Relationships  . Social connections:    Talks on phone: Not on file    Gets together: Not on file    Attends religious service: Not on file    Active member of club or organization: Not on file    Attends meetings of clubs or organizations: Not on file    Relationship status: Not on file  . Intimate partner violence:    Fear of current  or ex partner: Not on file    Emotionally abused: Not on file    Physically abused: Not on file    Forced sexual activity: Not on file  Other Topics Concern  . Not on file  Social History Narrative   Originally from [place]. Moved to [facility] in July 22, 1985. 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 No known asbestos exposure. 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 No international travel. Has a dog currently. Remote exposure to Cockatiel in a different home. No mold exposure. No hot tub exposure.       Two adopted children      Allergies as of 10/22/2017      Reactions   Leflunomide Other (See Comments)   diarrhea   Morphine And Related Nausea And Vomiting   Plaquenil [hydroxychloroquine Sulfate]    rash      Medication List        Accurate as of 10/22/17 11:59 PM.  Always use your most recent med list.          acetaminophen-codeine 300-30 MG tablet Commonly known as:  TYLENOL #3 Take 1 tablet by mouth every 4 (four) hours as needed for moderate pain.   apixaban 5 MG Tabs tablet Commonly known as:  ELIQUIS Take 1 tablet (5 mg total) by mouth 2 (two) times daily.   ARIPiprazole 30 MG tablet Commonly known as:  ABILIFY Take 30 mg by mouth at bedtime.   aspirin EC 81 MG tablet Take 81 mg by mouth daily.   CALCIUM 500/D 500-200 MG-UNIT tablet Generic drug:  calcium-vitamin D Take 1 tablet by mouth daily with breakfast.   ciprofloxacin 0.3 % ophthalmic solution Commonly known as:  CILOXAN Place 1 drop into both eyes every 4 (four) hours while awake.   divalproex 500 MG 24 hr tablet Commonly known as:  DEPAKOTE ER 3 tabs at bedtime   folic acid 1 MG tablet Commonly known as:  FOLVITE Take 1 mg by mouth daily.   furosemide 20 MG tablet Commonly known as:  LASIX Take 1 tablet (20 mg total) by mouth daily.   hydrocortisone cream 1 % Apply on affected areas on face and behind ears twice daily for 14 days.   ketoconazole 2 % shampoo Commonly known as:  NIZORAL Lather and massage into scalp 2-3 times per week. Leave for 5 minutes before rinsing.   methotrexate 2.5 MG tablet Commonly known as:  RHEUMATREX Take 17.5 mg by mouth once a week. Reported on 12/06/2015   mirabegron ER 50 MG Tb24 tablet Commonly known as:  MYRBETRIQ Take 50 mg by mouth daily.   Omega-3 1000 MG Caps Take 1 capsule by mouth daily.   omeprazole 20 MG capsule Commonly known as:  PRILOSEC Take 20 mg by mouth every morning.   oxybutynin 5 MG tablet Commonly known as:  DITROPAN Take 5 mg by mouth 2 (two) times daily.   OXYGEN Inhale into the lungs. To use if O2 drops below 85 %   predniSONE 5 MG tablet Commonly known as:  DELTASONE Take 5 mg by mouth daily with breakfast.   vitamin C 500 MG tablet Commonly known as:  ASCORBIC ACID Take 500 mg by  mouth daily.   XELJANZ XR 11 MG Tb24 Generic drug:  Tofacitinib Citrate Take 1 tablet by mouth daily.   ziprasidone  80 MG capsule Commonly known as:  GEODON Take 80 mg by mouth daily.          Objective:   Physical Exam BP 128/84 (BP Location: Left Arm, Patient Position: Sitting, Cuff Size: Small)   Pulse 61   Temp 97.9 F (36.6 C) (Oral)   Resp 16   Ht 5\' 1"  (1.549 m)   Wt 227 lb (103 kg)   SpO2 90%   BMI 42.89 kg/m  General:   Well developed, well nourished.  HEENT:  Normocephalic . Face symmetric, atraumatic. Eyes: Conjunctiva slightly erythematous, small amount of dried discharge noted in the corners Abdomen: Exam performed while the patient was sitting, not distended, soft. Lower extremities: Wrapped Neurologic:  alert & oriented X3.  Speech normal, gait not tested Psych--  He was very argumentative with me and with the wife     Assessment & Plan:    Assessment   (new pt, transfer from [facility],02-2015) Hypertension Hyperlipidemia Ao stenosis, severe: Dr Burt Knack MSK: --Seronegative Rheumatoid arthritis-- Dr Lenna Gilford  --DJD-- DR Tamera Punt , dr Ronnie Derby --low back pain d/t DJD, Dr Sherlyn Lick  PSYCH: Schizophrenia, bipolar, depression: Follow-up at the [place] in [facility] GU: Bladder cancer-transitional cell, Dr. Shona Needles GI:   GERD, history of colitis, history of gastritis,  colon polyps- had a virtual cscope 2016, + polyps, declined further eval Pulmomany: Dr Ashok Cordia --Sleep apnea:  Cpap intolerant --CT nodules f/u 2 pulmonary  --hypoxemia, dx 01-2017, unable to get O2 from medicare, eventually got O2 home-portable ~08-2017 L LEG DVT 02-2016 Varicose veins: s/p remote surgery, has LE wounds  H/o  iron deficiency anemia At the [place]: sees a dentist, PCP, psych, Ortho, pain mngmt   PLAN  Schizophrenia, bipolar, depression: Patient recently admitted involuntarily due to homicidal ideas and delusions.  Sees psychiatry regularly.  He is very argumentative with the wife today, I  did the best I could try to counseling them.  Encouraged to continue taking his medications regularly. Lower extremity edema: Not taking Lasix currently. Constipation: I recommend not to take calcium supplements.  He denies all GI symptoms, he takes MiraLAX inconsistently. Plan: MiraLAX daily, enema tomorrow, if no better they will call, lactulose?. RA : Not taking any of the medications prescribed to him.  He is very adamant about not taking them.  If he does not see any worsening of his symptoms I do not see a reason to go back on meds, nevertheless I encouraged him to talk with his rheumatologist. Mild conjunctivitis?  Prescribed Cipro, see instructions.  Call if no better Poor compliance with meds: I have not modified his medication list for now.  Today, I spent more than  35  min with the patient: >50% of the time counseling regards depression, listening to all his concerns and his wife's concerns, counseling to the best of my ability.

## 2017-10-22 NOTE — Progress Notes (Signed)
Pre visit review using our clinic review tool, if applicable. No additional management support is needed unless otherwise documented below in the visit note. 

## 2017-10-23 ENCOUNTER — Encounter: Payer: Self-pay | Admitting: Internal Medicine

## 2017-10-23 DIAGNOSIS — I890 Lymphedema, not elsewhere classified: Secondary | ICD-10-CM | POA: Diagnosis not present

## 2017-10-23 DIAGNOSIS — L89890 Pressure ulcer of other site, unstageable: Secondary | ICD-10-CM | POA: Diagnosis not present

## 2017-10-23 DIAGNOSIS — I87312 Chronic venous hypertension (idiopathic) with ulcer of left lower extremity: Secondary | ICD-10-CM | POA: Diagnosis not present

## 2017-10-23 DIAGNOSIS — L97221 Non-pressure chronic ulcer of left calf limited to breakdown of skin: Secondary | ICD-10-CM | POA: Diagnosis not present

## 2017-10-23 DIAGNOSIS — I872 Venous insufficiency (chronic) (peripheral): Secondary | ICD-10-CM | POA: Diagnosis not present

## 2017-10-23 DIAGNOSIS — L97512 Non-pressure chronic ulcer of other part of right foot with fat layer exposed: Secondary | ICD-10-CM | POA: Diagnosis not present

## 2017-10-23 NOTE — Assessment & Plan Note (Signed)
Schizophrenia, bipolar, depression: Patient recently admitted involuntarily due to homicidal ideas and delusions.  Sees psychiatry regularly.  He is very argumentative with the wife today, I did the best I could try to counseling them.  Encouraged to continue taking his medications regularly. Lower extremity edema: Not taking Lasix currently. Constipation: I recommend not to take calcium supplements.  He denies all GI symptoms, he takes MiraLAX inconsistently. Plan: MiraLAX daily, enema tomorrow, if no better they will call, lactulose?. RA : Not taking any of the medications prescribed to him.  He is very adamant about not taking them.  If he does not see any worsening of his symptoms I do not see a reason to go back on meds, nevertheless I encouraged him to talk with his rheumatologist. Mild conjunctivitis?  Prescribed Cipro, see instructions.  Call if no better Poor compliance with meds: I have not modified his medication list for now.

## 2017-10-28 DIAGNOSIS — I890 Lymphedema, not elsewhere classified: Secondary | ICD-10-CM | POA: Diagnosis not present

## 2017-10-28 DIAGNOSIS — I87312 Chronic venous hypertension (idiopathic) with ulcer of left lower extremity: Secondary | ICD-10-CM | POA: Diagnosis not present

## 2017-10-28 DIAGNOSIS — L97512 Non-pressure chronic ulcer of other part of right foot with fat layer exposed: Secondary | ICD-10-CM | POA: Diagnosis not present

## 2017-10-28 DIAGNOSIS — I872 Venous insufficiency (chronic) (peripheral): Secondary | ICD-10-CM | POA: Diagnosis not present

## 2017-10-28 DIAGNOSIS — L89890 Pressure ulcer of other site, unstageable: Secondary | ICD-10-CM | POA: Diagnosis not present

## 2017-10-28 DIAGNOSIS — L97221 Non-pressure chronic ulcer of left calf limited to breakdown of skin: Secondary | ICD-10-CM | POA: Diagnosis not present

## 2017-11-03 ENCOUNTER — Telehealth: Payer: Self-pay | Admitting: *Deleted

## 2017-11-03 ENCOUNTER — Ambulatory Visit (INDEPENDENT_AMBULATORY_CARE_PROVIDER_SITE_OTHER): Payer: Medicare Other | Admitting: Internal Medicine

## 2017-11-03 ENCOUNTER — Encounter: Payer: Self-pay | Admitting: Internal Medicine

## 2017-11-03 VITALS — BP 128/88 | HR 74 | Resp 18 | Ht 61.0 in | Wt 229.0 lb

## 2017-11-03 DIAGNOSIS — M0579 Rheumatoid arthritis with rheumatoid factor of multiple sites without organ or systems involvement: Secondary | ICD-10-CM | POA: Diagnosis not present

## 2017-11-03 DIAGNOSIS — R2689 Other abnormalities of gait and mobility: Secondary | ICD-10-CM

## 2017-11-03 DIAGNOSIS — H1033 Unspecified acute conjunctivitis, bilateral: Secondary | ICD-10-CM | POA: Diagnosis not present

## 2017-11-03 DIAGNOSIS — K1121 Acute sialoadenitis: Secondary | ICD-10-CM

## 2017-11-03 DIAGNOSIS — R21 Rash and other nonspecific skin eruption: Secondary | ICD-10-CM

## 2017-11-03 MED ORDER — NYSTATIN 100000 UNIT/ML MT SUSP: 5.0000 mL | Freq: Four times a day (QID) | OROMUCOSAL | 0 refills | Status: DC

## 2017-11-03 MED ORDER — AMOXICILLIN-POT CLAVULANATE 875-125 MG PO TABS: 1.0000 | ORAL_TABLET | Freq: Two times a day (BID) | ORAL | 0 refills | Status: DC

## 2017-11-03 MED ORDER — BETAMETHASONE DIPROPIONATE AUG 0.05 % EX CREA: TOPICAL_CREAM | Freq: Two times a day (BID) | CUTANEOUS | 0 refills | Status: DC

## 2017-11-03 NOTE — Telephone Encounter (Signed)
Received Physician Orders/CMN Oxygen from [facility]; forwarded to provider/SLS 04/15

## 2017-11-03 NOTE — Assessment & Plan Note (Addendum)
Mobility : Limited by DJD, chronic back pain, rheumatoid arthritis, morbid obesity, hypoxia. Needs a Rx and  and letter of support to get a Harrel Lemon lift which he definitely needs.  Will do one for the [place] and Medicare. Acute parotitis: new, started early today; condition was discussed with the patient, Rx Augmentin, warm compresses, prompt re-eval if not better, follow-up 2 weeks.  Also recommend to suck on sour drops or a lemon Conjunctivitis: Bilateral, persistent despite topical antibiotics (see last OV ).  Refer to ophthalmology, asked to be seen this week. Throat irritation: Unclear etiology, throat is a slightly red but denies fever or sore throat.  He has abundant thick sputum at the back of the throat not classic for thrush but that is in the differential.  To be safe will prescribe nystatin. Rash: Also, they asked me for a stronger cream for a facial rash which is chronic.  On exam he has a few patches of erythema/scaliness on the right face.  Eczema?  Not responding to OTCs steroids.  Rx diprolene RTC  2 weeks

## 2017-11-03 NOTE — Progress Notes (Signed)
Subjective:    Patient ID: Shaun Yoder, male    DOB: 07/27/1945, 73 y.o.   MRN: [id number]  DOS:  11/03/2017 Type of visit - description :  to discuss mobility Interval history: Mobility: Having increased difficulty with mobility and transferring.  A few days ago he tripped simply by trying to go into a already modified shower.  Also, at 2 AM today he noted swelling and pain at the right face. Denies any fever, chills, gum pain, sore throat.  Was seen with conjunctivitis few days ago, not better. Denies photophobia or blurred vision.  Review of Systems Se above  Past Medical History:  Diagnosis Date  . Anemia   . Cancer ([place])    bladder  . Colitis   . Colon polyps   . Depression   . Diverticulosis   . DVT of deep femoral vein ([place])   . Gastritis   . GERD (gastroesophageal reflux disease)   . History of rectal polyps   . Hyperlipidemia   . Hypertension   . Iron deficiency   . Low back pain   . OA (osteoarthritis)   . Osteonecrosis of shoulder region ([place])   . Schizoaffective disorder, bipolar type ([place])   . Sleep apnea   . Varicose vein     Past Surgical History:  Procedure Laterality Date  . BLADDER SURGERY    . TOTAL KNEE ARTHROPLASTY Bilateral   . VARICOSE VEIN SURGERY      Social History   Socioeconomic History  . Marital status: Married    Spouse name: Not on file  . Number of children: 2  . Years of education: Not on file  . Highest education level: Not on file  Occupational History  . Occupation: retired  Scientific laboratory technician  . Financial resource strain: Not on file  . Food insecurity:    Worry: Not on file    Inability: Not on file  . Transportation needs:    Medical: Not on file    Non-medical: Not on file  Tobacco Use  . Smoking status: Former Smoker    Packs/day: 2.50    Years: 43.00    Pack years: 107.50    Types: Cigarettes    Last attempt to quit: 07/22/1998    Years since quitting: 19.2  . Smokeless tobacco: Never Used    Substance and Sexual Activity  . Alcohol use: Yes    Alcohol/week: 0.0 oz    Comment: Occasional glass of wine  . Drug use: No  . Sexual activity: Not on file  Lifestyle  . Physical activity:    Days per week: Not on file    Minutes per session: Not on file  . Stress: Not on file  Relationships  . Social connections:    Talks on phone: Not on file    Gets together: Not on file    Attends religious service: Not on file    Active member of club or organization: Not on file    Attends meetings of clubs or organizations: Not on file    Relationship status: Not on file  . Intimate partner violence:    Fear of current or ex partner: Not on file    Emotionally abused: Not on file    Physically abused: Not on file    Forced sexual activity: Not on file  Other Topics Concern  . Not on file  Social History Narrative   Originally from [place]. Moved to [facility] [place] [facility] in July 22, 1985. 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 He served in [employer] and trained to drive heavy equipment. No known asbestos exposure. 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 No international travel. Has a dog currently. Remote exposure to Cockatiel in a different home. No mold exposure. No hot tub exposure.       Two adopted children      Allergies as of 11/03/2017      Reactions   Leflunomide Other (See Comments)   diarrhea   Morphine And Related Nausea And Vomiting   Plaquenil [hydroxychloroquine Sulfate]    rash      Medication List        Accurate as of 11/03/17  9:18 PM. Always use your most recent med list.          acetaminophen-codeine 300-30 MG tablet Commonly known as:  TYLENOL #3 Take 1 tablet by mouth every 4 (four) hours as needed for moderate pain.   amoxicillin-clavulanate 875-125 MG tablet Commonly known as:  AUGMENTIN Take 1 tablet by mouth 2 (two) times daily.   apixaban 5 MG Tabs  tablet Commonly known as:  ELIQUIS Take 1 tablet (5 mg total) by mouth 2 (two) times daily.   ARIPiprazole 30 MG tablet Commonly known as:  ABILIFY Take 30 mg by mouth at bedtime.   aspirin EC 81 MG tablet Take 81 mg by mouth daily.   augmented betamethasone dipropionate 0.05 % cream Commonly known as:  DIPROLENE-AF Apply topically 2 (two) times daily. Apply to the rash of the right side of the face   CALCIUM 500/D 500-200 MG-UNIT tablet Generic drug:  calcium-vitamin D Take 1 tablet by mouth daily with breakfast.   ciprofloxacin 0.3 % ophthalmic solution Commonly known as:  CILOXAN Place 1 drop into both eyes every 4 (four) hours while awake.   divalproex 500 MG 24 hr tablet Commonly known as:  DEPAKOTE ER 3 tabs at bedtime   folic acid 1 MG tablet Commonly known as:  FOLVITE Take 1 mg by mouth daily.   furosemide 20 MG tablet Commonly known as:  LASIX Take 1 tablet (20 mg total) by mouth daily.   hydrocortisone cream 1 % Apply on affected areas on face and behind ears twice daily for 14 days.   ketoconazole 2 % shampoo Commonly known as:  NIZORAL Lather and massage into scalp 2-3 times per week. Leave for 5 minutes before rinsing.   methotrexate 2.5 MG tablet Commonly known as:  RHEUMATREX Take 17.5 mg by mouth once a week. Reported on 12/06/2015   mirabegron ER 50 MG Tb24 tablet Commonly known as:  MYRBETRIQ Take 50 mg by mouth daily.   nystatin 100000 UNIT/ML suspension Commonly known as:  MYCOSTATIN Take 5 mLs (500,000 Units total) by mouth 4 (four) times daily.   Omega-3 1000 MG Caps Take 1 capsule by mouth daily.   omeprazole 20 MG capsule Commonly known as:  PRILOSEC Take 20 mg by mouth every morning.   oxybutynin 5 MG tablet Commonly known as:  DITROPAN Take 5 mg by mouth 2 (two) times daily.   OXYGEN Inhale into the lungs. To use if O2 drops below 85 %   predniSONE 5 MG tablet Commonly known as:  DELTASONE Take 5 mg by mouth daily with  breakfast.   vitamin C 500 MG tablet Commonly known as:  ASCORBIC ACID Take 500 mg by mouth daily.   XELJANZ XR 11  MG Tb24 Generic drug:  Tofacitinib Citrate Take 1 tablet by mouth daily.   ziprasidone 80 MG capsule Commonly known as:  GEODON Take 80 mg by mouth daily.            Durable Medical Equipment  (From admission, onward)        Start     Ordered   11/03/17 0000  DME Other see comment    Comments:  HOYER LIFT Dx: M06.9, R26.89   11/03/17 1534         Objective:   Physical Exam BP 128/88 (BP Location: Left Arm, Patient Position: Sitting, Cuff Size: Normal)   Pulse 74   Resp 18   Ht 5\' 1"  (1.549 m)   Wt 229 lb (103.9 kg)   SpO2 95%   BMI 43.27 kg/m  General:   Well developed, well nourished . NAD.  HEENT:  Normocephalic . Face  Atraumatic EOMI.  Pupils constricted, slightly hyperactive.  Abundant by brown discharge laterally, no photophobia noted.  Anterior chambers normal. Mouth: No obvious gum abscess or lesions, he does have mild erythema in the throat with dry mucous. The right parotid gland is swollen, tender, slightly warm.  No LADs MSK: Severe deformities at the upper and lower extremities consistent with RA Skin: Not pale. Not jaundice  Neurologic:  alert & oriented X3.  Speech normal, gait not tested.  Patient is essentially wheelchair-bound, unable to transfer without help. Psych--  Cognition and judgment appear intact.  Cooperative with normal attention span and concentration.  Behavior appropriate. No anxious or depressed appearing.          Assessment & Plan:    Assessment   (new pt, transfer from [facility],02-2015) Hypertension Hyperlipidemia Ao stenosis, severe: Dr Burt Knack MSK: --Seronegative Rheumatoid arthritis-- Dr Lenna Gilford  --DJD-- DR Tamera Punt , dr Ronnie Derby --low back pain d/t DJD, Dr Sherlyn Lick  PSYCH: Schizophrenia, bipolar, depression: Follow-up at the [place] in [facility] GU: Bladder cancer-transitional cell, Dr. Shona Needles GI:     GERD, history of colitis, history of gastritis,  colon polyps- had a virtual cscope 2016, + polyps, declined further eval Pulmomany: Dr Ashok Cordia --Sleep apnea:  Cpap intolerant --CT nodules f/u 2 pulmonary  --hypoxemia, dx 01-2017, unable to get O2 from medicare, eventually got O2 home-portable ~08-2017 L LEG DVT 02-2016 Varicose veins: s/p remote surgery, has LE wounds  H/o  iron deficiency anemia At the [place]: sees a dentist, PCP, psych, Ortho, pain mngmt   PLAN  Mobility : Limited by DJD, chronic back pain, rheumatoid arthritis, morbid obesity , hypoxia. Needs a Rx and  and letter of support to get a Harrel Lemon lift which he definitely needs.  Will do one for the [place] and Medicare. Acute parotitis: new, started early today; condition was discussed with the patient, Rx Augmentin, warm compresses, prompt re-eval if not better, follow-up 2 weeks.  Also recommend to suck on sour drops or a lemon Conjunctivitis: Bilateral, persistent despite topical antibiotics (see last OV ).  Refer to ophthalmology, asked to be seen this week. Throat irritation: Unclear etiology, throat is a slightly red but denies fever or sore throat.  He has abundant thick sputum at the back of the throat not classic for thrush but that is in the differential.  To be safe will prescribe nystatin. Rash: Also, they asked me for a stronger cream for a facial rash which is chronic.  On exam he has a few patches of erythema/scaliness on the right face.  Eczema?  Not responding to OTCs  steroids.  Rx diprolene RTC  2 weeks   Today, I spent more than 42   min with the patient: >50% of the time counseling regards 4 acute problems, including parotitis, explaining him what it is, how to treat it . Also coordinating his care writing letters on prescription for a [facility] lifter.

## 2017-11-03 NOTE — Patient Instructions (Addendum)
Please back in 2 weeks  Take the antibiotic  Augmentin as prescribed  Warm compress to the right side of your face  Call if not gradually improving, call if severe pain, fever, the swelling is spreading or you develop facial redness.  We are referring you to the eye doctor

## 2017-11-04 ENCOUNTER — Ambulatory Visit: Payer: Self-pay | Admitting: *Deleted

## 2017-11-04 ENCOUNTER — Emergency Department (HOSPITAL_BASED_OUTPATIENT_CLINIC_OR_DEPARTMENT_OTHER): Payer: Medicare Other

## 2017-11-04 ENCOUNTER — Other Ambulatory Visit: Payer: Self-pay

## 2017-11-04 ENCOUNTER — Encounter (HOSPITAL_BASED_OUTPATIENT_CLINIC_OR_DEPARTMENT_OTHER): Payer: Self-pay

## 2017-11-04 ENCOUNTER — Emergency Department (HOSPITAL_BASED_OUTPATIENT_CLINIC_OR_DEPARTMENT_OTHER)
Admission: EM | Admit: 2017-11-04 | Discharge: 2017-11-04 | Disposition: A | Discharge status: Home / Self Care | Payer: Medicare Other | Attending: Emergency Medicine | Admitting: Emergency Medicine

## 2017-11-04 DIAGNOSIS — Z7982 Long term (current) use of aspirin: Secondary | ICD-10-CM | POA: Diagnosis not present

## 2017-11-04 DIAGNOSIS — Z87891 Personal history of nicotine dependence: Secondary | ICD-10-CM | POA: Insufficient documentation

## 2017-11-04 DIAGNOSIS — I10 Essential (primary) hypertension: Secondary | ICD-10-CM | POA: Insufficient documentation

## 2017-11-04 DIAGNOSIS — R600 Localized edema: Secondary | ICD-10-CM | POA: Diagnosis present

## 2017-11-04 DIAGNOSIS — Z8551 Personal history of malignant neoplasm of bladder: Secondary | ICD-10-CM | POA: Insufficient documentation

## 2017-11-04 DIAGNOSIS — Z96653 Presence of artificial knee joint, bilateral: Secondary | ICD-10-CM | POA: Diagnosis not present

## 2017-11-04 DIAGNOSIS — Z79899 Other long term (current) drug therapy: Secondary | ICD-10-CM | POA: Diagnosis not present

## 2017-11-04 DIAGNOSIS — K1120 Sialoadenitis, unspecified: Secondary | ICD-10-CM | POA: Diagnosis not present

## 2017-11-04 DIAGNOSIS — R221 Localized swelling, mass and lump, neck: Secondary | ICD-10-CM | POA: Diagnosis not present

## 2017-11-04 LAB — BASIC METABOLIC PANEL
Anion gap: 9 (ref 5–15)
BUN: 21 mg/dL — AB (ref 6–20)
CHLORIDE: 100 mmol/L — AB (ref 101–111)
CO2: 26 mmol/L (ref 22–32)
CREATININE: 0.6 mg/dL — AB (ref 0.61–1.24)
Calcium: 8.6 mg/dL — ABNORMAL LOW (ref 8.9–10.3)
GFR calc Af Amer: >60 mL/min (ref >60)
GFR calc non Af Amer: >60 mL/min (ref >60)
GLUCOSE: 90 mg/dL (ref 65–99)
Potassium: 4.1 mmol/L (ref 3.5–5.1)
Sodium: 135 mmol/L (ref 135–145)

## 2017-11-04 LAB — CBC WITH DIFFERENTIAL/PLATELET
BASOS PCT: 0 %
Basophils Absolute: 0 10*3/uL (ref 0.0–0.1)
EOS PCT: 0 %
Eosinophils Absolute: 0 10*3/uL (ref 0.0–0.7)
HCT: 41 % (ref 39.0–52.0)
Hemoglobin: 14 g/dL (ref 13.0–17.0)
LYMPHS ABS: 1.2 10*3/uL (ref 0.7–4.0)
Lymphocytes Relative: 14 %
MCH: 32.4 pg (ref 26.0–34.0)
MCHC: 34.1 g/dL (ref 30.0–36.0)
MCV: 94.9 fL (ref 78.0–100.0)
MONO ABS: 0.9 10*3/uL (ref 0.1–1.0)
Monocytes Relative: 10 %
NEUTROS ABS: 6.6 10*3/uL (ref 1.7–7.7)
Neutrophils Relative %: 76 %
PLATELETS: 95 10*3/uL — AB (ref 150–400)
RBC: 4.32 MIL/uL (ref 4.22–5.81)
RDW: 13.8 % (ref 11.5–15.5)
WBC: 8.7 10*3/uL (ref 4.0–10.5)

## 2017-11-04 MED ORDER — IOPAMIDOL (ISOVUE-300) INJECTION 61%
100.0000 mL | Freq: Once | INTRAVENOUS | Status: AC | PRN
  Administered 2017-11-04: 80 mL via INTRAVENOUS

## 2017-11-04 MED ORDER — TRAMADOL HCL 50 MG PO TABS: 50.0000 mg | ORAL_TABLET | Freq: Four times a day (QID) | ORAL | 0 refills | Status: DC | PRN

## 2017-11-04 MED ORDER — SODIUM CHLORIDE 0.9 % IV SOLN
3.0000 g | Freq: Once | INTRAVENOUS | Status: AC
  Administered 2017-11-04: 3 g via INTRAVENOUS
  Filled 2017-11-04: qty 3

## 2017-11-04 MED ORDER — SODIUM CHLORIDE 0.9 % IV BOLUS
1000.0000 mL | Freq: Once | INTRAVENOUS | Status: AC
  Administered 2017-11-04: 1000 mL via INTRAVENOUS

## 2017-11-04 MED FILL — traMADol HCL 50 MG TABS: 50 | 3 days supply | Qty: 15 | Fill #0

## 2017-11-04 NOTE — ED Notes (Signed)
Patient transported to CT 

## 2017-11-04 NOTE — ED Provider Notes (Addendum)
[facility] Provider Note   CSN: [id number] Arrival date & time: 11/04/17  1010     History   Chief Complaint Chief Complaint  Patient presents with  . Facial Swelling    HPI Shaun Yoder is a 72 y.o. male.  Complaint is neck swelling.  HPI this is a 72 year old male.  Followed by Dr. Larose Kells.  Seen yesterday diagnosed with possible sialadenitis.  Had some pain and swelling of his right mandible.  Given antibiotics.  He states that it is significantly more swollen and painful this morning.  He is not diabetic.  No history of cancer.  No difficulty breathing or swallowing.  Simply painful to chew or eat.  Is on antibiotics with amoxicillin yesterday.  No pain medication given.  Call the office today stating that it was more swollen and more swollen "towards the front.  Was appropriately referred to emergency room for further evaluation.  Past Medical History:  Diagnosis Date  . Anemia   . Cancer ([place])    bladder  . Colitis   . Colon polyps   . Depression   . Diverticulosis   . DVT of deep femoral vein ([place])   . Gastritis   . GERD (gastroesophageal reflux disease)   . History of rectal polyps   . Hyperlipidemia   . Hypertension   . Iron deficiency   . Low back pain   . OA (osteoarthritis)   . Osteonecrosis of shoulder region ([place])   . Schizoaffective disorder, bipolar type ([place])   . Sleep apnea   . Varicose vein     Patient Active Problem List   Diagnosis Date Noted  . Bacteremia 08/31/2017  . Cellulitis 08/29/2017  . Sepsis ([place]) 08/28/2017  . Nocturnal hypoxemia 02/11/2017  . Primary osteoarthritis of left hip 12/06/2016  . Chronic right shoulder pain 12/06/2016  . Contracture of joint of right shoulder region 12/06/2016  . Chronic left hip pain 09/12/2016  . Status post bilateral knee replacements 09/12/2016  . Gait disorder 06/03/2016  . Left leg DVT ([place]) 04/04/2016  . Non-pressure chronic ulcer of right ankle with fat  layer exposed ([place]) 10/25/2015  . Varicose veins of lower extremities with ulcer ([place]) 09/07/2015  . Annual physical exam 05/23/2015  . PCP NOTES >>>>>>>>>>>>>>>>> 03/28/2015  . Hypertension 03/28/2015  . Pulmonary nodules 12/09/2014  . Sleep apnea 11/14/2014  . GERD (gastroesophageal reflux disease) 11/14/2014  . Morbid obesity ([place]) 11/14/2014  . Aortic stenosis, severe   . Schizoaffective disorder, bipolar type ([place])   . Rheumatoid arthritis ([place])   . Hyperlipidemia   . Bifasicular block   . Mononeuritis of upper limb 05/20/2013  . Pain in soft tissues of limb 05/20/2013  . 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 Depression 01/13/2013  . Memory loss 01/13/2013    Past Surgical History:  Procedure Laterality Date  . BLADDER SURGERY    . TOTAL KNEE ARTHROPLASTY Bilateral   . VARICOSE VEIN SURGERY          Home Medications    Prior to Admission medications   Medication Sig Start Date End Date Taking? Authorizing Provider  acetaminophen-codeine (TYLENOL #3) 300-30 MG tablet Take 1 tablet by mouth every 4 (four) hours as needed for moderate pain.    [provider]  amoxicillin-clavulanate (AUGMENTIN) 875-125 MG tablet Take 1 tablet by mouth 2 (two) times daily. 11/03/17   Colon Branch, MD  apixaban (ELIQUIS) 5 MG TABS tablet Take 1 tablet (5 mg total) by  mouth 2 (two) times daily. Patient taking differently: Take 2.5 mg by mouth 2 (two) times daily.  03/25/17   Colon Branch, MD  ARIPiprazole (ABILIFY) 30 MG tablet Take 30 mg by mouth at bedtime.      [provider]  aspirin EC 81 MG tablet Take 81 mg by mouth daily.    [provider]  augmented betamethasone dipropionate (DIPROLENE-AF) 0.05 % cream Apply topically 2 (two) times daily. Apply to the rash of the right side of the face 11/03/17   Colon Branch, MD  calcium-vitamin D (CALCIUM 500/D) 500-200 MG-UNIT tablet Take 1 tablet by mouth daily with breakfast.     [provider]  ciprofloxacin  (CILOXAN) 0.3 % ophthalmic solution Place 1 drop into both eyes every 4 (four) hours while awake. 10/22/17   Colon Branch, MD  divalproex (DEPAKOTE ER) 500 MG 24 hr tablet 3 tabs at bedtime    [provider]  folic acid (FOLVITE) 1 MG tablet Take 1 mg by mouth daily.      [provider]  furosemide (LASIX) 20 MG tablet Take 1 tablet (20 mg total) by mouth daily. 10/17/17   Colon Branch, MD  hydrocortisone cream 1 % Apply on affected areas on face and behind ears twice daily for 14 days. Patient taking differently: Apply on affected areas on face and behind ears twice daily as needed for rash 01/01/17   Wendling, Crosby Oyster, DO  ketoconazole (NIZORAL) 2 % shampoo Lather and massage into scalp 2-3 times per week. Leave for 5 minutes before rinsing. 07/07/17   Colon Branch, MD  methotrexate (RHEUMATREX) 2.5 MG tablet Take 17.5 mg by mouth once a week. Reported on 12/06/2015    [name], MD  mirabegron ER (MYRBETRIQ) 50 MG TB24 tablet Take 50 mg by mouth daily.    [provider]  nystatin (MYCOSTATIN) 100000 UNIT/ML suspension Take 5 mLs (500,000 Units total) by mouth 4 (four) times daily. 11/03/17   Colon Branch, MD  Omega-3 1000 MG CAPS Take 1 capsule by mouth daily.     [provider]  omeprazole (PRILOSEC) 20 MG capsule Take 20 mg by mouth every morning.     [provider]  oxybutynin (DITROPAN) 5 MG tablet Take 5 mg by mouth 2 (two) times daily.    [provider]  OXYGEN Inhale into the lungs. To use if O2 drops below 85 %    [provider]  predniSONE (DELTASONE) 5 MG tablet Take 5 mg by mouth daily with breakfast.    [provider]  Tofacitinib Citrate (XELJANZ XR) 11 MG TB24 Take 1 tablet by mouth daily.    [provider]  traMADol (ULTRAM) 50 MG tablet Take 1 tablet (50 mg total) by mouth every 6 (six) hours as needed. 11/04/17   Tanna Furry, MD  vitamin C (ASCORBIC ACID) 500 MG tablet Take 500 mg by mouth  daily.    [provider]  ziprasidone (GEODON) 80 MG capsule Take 80 mg by mouth daily.    [provider]    Family History Family History  Problem Relation Age of Onset  . Heart attack Mother   . Heart attack Father   . Stroke Father   . Rheumatologic disease Neg Hx   . Colon cancer Neg Hx   . Lung disease Neg Hx   . Prostate cancer Neg Hx     Social History Social History   Tobacco  Use  . Smoking status: Former Smoker    Packs/day: 2.50    Years: 43.00    Pack years: 107.50    Types: Cigarettes    Last attempt to quit: 07/22/1998    Years since quitting: 19.3  . Smokeless tobacco: Never Used  Substance Use Topics  . Alcohol use: Yes    Alcohol/week: 0.0 oz    Comment: Occasional glass of wine  . Drug use: No     Allergies   Leflunomide; Morphine and related; and Plaquenil [hydroxychloroquine sulfate]   Review of Systems Review of Systems  Constitutional: Negative for appetite change, chills, diaphoresis, fatigue and fever.  HENT: Negative for mouth sores, sore throat and trouble swallowing.        There are [place] well-demarcated soft tissue swelling and induration along the area of the parotid gland.  There is thin drainage from the Stensen's duct with the gland is massaged.  No frank purulence.  Soft floor of the mouth.  No induration.  No soft tissue swelling or induration submental neck.  No obvious dental or periodontal disease.  Posterior pharynx benign.  He is able to handle his secretions.  He is not stridorous.  He can lay supine without apprehension.  Eyes: Negative for visual disturbance.  Respiratory: Negative for cough, chest tightness, shortness of breath and wheezing.   Cardiovascular: Negative for chest pain.  Gastrointestinal: Negative for abdominal distention, abdominal pain, diarrhea, nausea and vomiting.  Endocrine: Negative for polydipsia, polyphagia and polyuria.  Genitourinary: Negative for dysuria, frequency and hematuria.    Musculoskeletal: Negative for gait problem.  Skin: Negative for color change, pallor and rash.  Neurological: Negative for dizziness, syncope, light-headedness and headaches.  Hematological: Does not bruise/bleed easily.  Psychiatric/Behavioral: Negative for behavioral problems and confusion.     Physical Exam Updated Vital Signs BP 103/76   Pulse (!) 54   Temp 98.3 F (36.8 C) (Oral)   Resp 20   SpO2 97%   Physical Exam  Constitutional: He is oriented to person, place, and time. He appears well-developed and well-nourished. No distress.  HENT:  Head: Normocephalic.  Tenderness and pain along the inner aspect of the mucosa consistent with a sialadenitis.  No induration of the midline neck.  Eyes: Pupils are equal, round, and reactive to light. Conjunctivae are normal. No scleral icterus.  Neck: Normal range of motion. Neck supple. No thyromegaly present.  Cardiovascular: Normal rate and regular rhythm. Exam reveals no gallop and no friction rub.  No murmur heard. Pulmonary/Chest: Effort normal and breath sounds normal. No respiratory distress. He has no wheezes. He has no rales.  Abdominal: Soft. Bowel sounds are normal. He exhibits no distension. There is no tenderness. There is no rebound.  Musculoskeletal: Normal range of motion.  Neurological: He is alert and oriented to person, place, and time.  Skin: Skin is warm and dry. No rash noted.  Psychiatric: He has a normal mood and affect. His behavior is normal.     ED Treatments / Results  Labs (all labs ordered are listed, but only abnormal results are displayed) Labs Reviewed  BASIC METABOLIC PANEL - Abnormal; Notable for the following components:      Result Value   Chloride 100 (*)    BUN 21 (*)    Creatinine, Ser 0.60 (*)    Calcium 8.6 (*)    All other components within normal limits  CBC WITH DIFFERENTIAL/PLATELET - Abnormal; Notable for the following components:   Platelets 95 (*)  All other components  within normal limits    EKG None  Radiology Ct Maxillofacial W Contrast  Result Date: 11/04/2017 CLINICAL DATA:  Right facial swelling EXAM: CT MAXILLOFACIAL WITH CONTRAST TECHNIQUE: Multidetector CT imaging of the maxillofacial structures was performed with intravenous contrast. Multiplanar CT image reconstructions were also generated. CONTRAST:  54mL ISOVUE-300 IOPAMIDOL (ISOVUE-300) INJECTION 61% COMPARISON:  None. FINDINGS: Osseous: Cervical spondylosis. Negative for fracture or skeletal lesion. Orbits: No soft tissue swelling or mass in the orbit. Sinuses: Negative Soft tissues: Prominent enlargement of the right parotid gland which shows hyperenhancement. There is edema in the overlying subcutaneous fat. No focal mass or stone identified in the right parotid gland. Left parotid normal.  Submandibular glands normal.  No adenopathy Limited intracranial: Negative IMPRESSION: Findings compatible with right parotiditis without mass or adenopathy. Electronically Signed   By: Franchot Gallo M.D.   On: 11/04/2017 12:50    Procedures Procedures (including critical care time)  Medications Ordered in ED Medications  sodium chloride 0.9 % bolus 1,000 mL (1,000 mLs Intravenous New Bag/Given 11/04/17 1219)  Ampicillin-Sulbactam (UNASYN) 3 g in sodium chloride 0.9 % 100 mL IVPB (0 g Intravenous Stopped 11/04/17 1300)  iopamidol (ISOVUE-300) 61 % injection 100 mL (80 mLs Intravenous Contrast Given 11/04/17 1225)     Initial Impression / Assessment and Plan / ED Course  I have reviewed the triage vital signs and the nursing notes.  Pertinent labs & imaging results that were available during my care of the patient were reviewed by me and considered in my medical decision making (see chart for details).   Difference diagnosis is likely parotid infection.  Doubt malignancy as there is no surrounding lymphadenopathy.  Symptoms do not extend to the floor the mouth or the anterior neck to suggest  Ludwig's.  Scan obtained it does confirm well localized parotid infection without other significant findings.  No abscess or localized fluid collection.  Was given IV Unasyn.  IV morphine Zofran.  Discharged home.  Will use sour candy as a sialagogue.  Continue antibiotics.  Follow-up with his physician.  May want to consider ENT if not improving.  However, he has been closely followed at this point by his primary care physician, Dr. Larose Kells who was accurate in his diagnosis.  Final Clinical Impressions(s) / ED Diagnoses   Final diagnoses:  Sialadenitis    ED Discharge Orders        Ordered    traMADol (ULTRAM) 50 MG tablet  Every 6 hours PRN     11/04/17 1421       Tanna Furry, MD 11/04/17 1426    Tanna Furry, MD 12/09/17 [phone]

## 2017-11-04 NOTE — Telephone Encounter (Signed)
If he is getting worse/not responding, he may be developed suppurative parotitis, recommend ED  eval.  IV antibiotics?

## 2017-11-04 NOTE — Discharge Instructions (Addendum)
I agree with Dr. Ethel Rana diagnosis of salivary gland infection. Continue antibiotic . Sue sour candy or sour fruit juice three to four times per day to promote salivation from the gland. If not resolved within 7 days, recheck with Dr.Paz. He may want to consider referral to ENT physician.

## 2017-11-04 NOTE — Telephone Encounter (Signed)
FYI

## 2017-11-04 NOTE — Telephone Encounter (Signed)
Pt's wife, Janae Bridgeman,  called stating that her husband's face is swelling and is painful; he was seen in the office 11/03/17; she states that he has had 2 doses of his antibiotic; they have tried warm compresses but the pt is not able to keep them on; spoke with Mel Almond at [facility], and she states that per Dr Larose Kells the pt needs to go to ED; information given to pt's wife and she verbalizes understanding; will route to office for notification of this encounter

## 2017-11-04 NOTE — ED Triage Notes (Signed)
Pt was dx with ulcer to lt pretibial yesterday from PCP, states worse today and called pcp told to come here

## 2017-11-05 DIAGNOSIS — I872 Venous insufficiency (chronic) (peripheral): Secondary | ICD-10-CM | POA: Diagnosis not present

## 2017-11-05 DIAGNOSIS — L97512 Non-pressure chronic ulcer of other part of right foot with fat layer exposed: Secondary | ICD-10-CM | POA: Diagnosis not present

## 2017-11-05 DIAGNOSIS — L89890 Pressure ulcer of other site, unstageable: Secondary | ICD-10-CM | POA: Diagnosis not present

## 2017-11-05 DIAGNOSIS — L97221 Non-pressure chronic ulcer of left calf limited to breakdown of skin: Secondary | ICD-10-CM | POA: Diagnosis not present

## 2017-11-05 DIAGNOSIS — I890 Lymphedema, not elsewhere classified: Secondary | ICD-10-CM | POA: Diagnosis not present

## 2017-11-05 DIAGNOSIS — I87312 Chronic venous hypertension (idiopathic) with ulcer of left lower extremity: Secondary | ICD-10-CM | POA: Diagnosis not present

## 2017-11-10 NOTE — Telephone Encounter (Signed)
Forms completed and faxed to [facility] at [phone]. Forms sent for scanning.

## 2017-11-12 ENCOUNTER — Telehealth: Payer: Self-pay | Admitting: Internal Medicine

## 2017-11-12 NOTE — Telephone Encounter (Signed)
Please call the patient, check on him, was diagnosed with parotitis, is he doing better?

## 2017-11-12 NOTE — Telephone Encounter (Signed)
LMOM informing Pt to return call.  

## 2017-11-17 ENCOUNTER — Encounter: Payer: Self-pay | Admitting: Internal Medicine

## 2017-11-17 ENCOUNTER — Ambulatory Visit (INDEPENDENT_AMBULATORY_CARE_PROVIDER_SITE_OTHER): Payer: Medicare Other | Admitting: Internal Medicine

## 2017-11-17 VITALS — BP 116/74 | HR 69 | Temp 97.8°F | Resp 14 | Ht 61.0 in

## 2017-11-17 DIAGNOSIS — M0579 Rheumatoid arthritis with rheumatoid factor of multiple sites without organ or systems involvement: Secondary | ICD-10-CM | POA: Diagnosis not present

## 2017-11-17 DIAGNOSIS — K1121 Acute sialoadenitis: Secondary | ICD-10-CM | POA: Diagnosis not present

## 2017-11-17 MED ORDER — TRAMADOL HCL 50 MG PO TABS: 50.0000 mg | ORAL_TABLET | Freq: Four times a day (QID) | ORAL | 0 refills | Status: DC | PRN

## 2017-11-17 NOTE — Progress Notes (Signed)
Subjective:    Patient ID: Shaun Yoder, male    DOB: April 16, 1946, 72 y.o.   MRN: [id number]  DOS:  11/17/2017 Type of visit - description : f/u, see last visit Interval history: Was seen 11/03/2017 with multiple acute issues including acute parotitis, since when he went to the ER 11/04/2017 due to parotitis not improving, got IV Unasyn, morphine and Zofran.. Overall feels much improved. Conjunctivitis, rash and throat irritation: all improved  Review of Systems Continue with severe pain related to RA and DJD.  Refill Ultram?  Past Medical History:  Diagnosis Date  . Anemia   . Cancer ([place])    bladder  . Colitis   . Colon polyps   . Depression   . Diverticulosis   . DVT of deep femoral vein ([place])   . Gastritis   . GERD (gastroesophageal reflux disease)   . History of rectal polyps   . Hyperlipidemia   . Hypertension   . Iron deficiency   . Low back pain   . OA (osteoarthritis)   . Osteonecrosis of shoulder region ([place])   . Schizoaffective disorder, bipolar type ([place])   . Sleep apnea   . Varicose vein     Past Surgical History:  Procedure Laterality Date  . BLADDER SURGERY    . TOTAL KNEE ARTHROPLASTY Bilateral   . VARICOSE VEIN SURGERY      Social History   Socioeconomic History  . Marital status: Married    Spouse name: Not on file  . Number of children: 2  . Years of education: Not on file  . Highest education level: Not on file  Occupational History  . Occupation: retired  Scientific laboratory technician  . Financial resource strain: Not on file  . Food insecurity:    Worry: Not on file    Inability: Not on file  . Transportation needs:    Medical: Not on file    Non-medical: Not on file  Tobacco Use  . Smoking status: Former Smoker    Packs/day: 2.50    Years: 43.00    Pack years: 107.50    Types: Cigarettes    Last attempt to quit: 07/22/1998    Years since quitting: 19.3  . Smokeless tobacco: Never Used  Substance and Sexual Activity  . Alcohol use: Yes      Alcohol/week: 0.0 oz    Comment: Occasional glass of wine  . Drug use: No  . Sexual activity: Not on file  Lifestyle  . Physical activity:    Days per week: Not on file    Minutes per session: Not on file  . Stress: Not on file  Relationships  . Social connections:    Talks on phone: Not on file    Gets together: Not on file    Attends religious service: Not on file    Active member of club or organization: Not on file    Attends meetings of clubs or organizations: Not on file    Relationship status: Not on file  . Intimate partner violence:    Fear of current or ex partner: Not on file    Emotionally abused: Not on file    Physically abused: Not on file    Forced sexual activity: Not on file  Other Topics Concern  . Not on file  Social History Narrative   Originally from [place]. Moved to [facility] in July 22, 1985. 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 He served in [employer] and trained  to drive heavy equipment. No known asbestos exposure. 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 Previously worked in receiving at [employer]. 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 No international travel. Has a dog currently. Remote exposure to Cockatiel in a different home. No mold exposure. No hot tub exposure.       Two adopted children      Allergies as of 11/17/2017      Reactions   Leflunomide Other (See Comments)   diarrhea   Morphine And Related Nausea And Vomiting   Plaquenil [hydroxychloroquine Sulfate]    rash      Medication List        Accurate as of 11/17/17 11:59 PM. Always use your most recent med list.          acetaminophen-codeine 300-30 MG tablet Commonly known as:  TYLENOL #3 Take 1 tablet by mouth every 4 (four) hours as needed for moderate pain.   ARIPiprazole 30 MG tablet Commonly known as:  ABILIFY Take 30 mg by mouth at bedtime.   aspirin EC 81 MG tablet Take 81 mg by mouth daily.   augmented betamethasone dipropionate 0.05 %  cream Commonly known as:  DIPROLENE-AF Apply topically 2 (two) times daily. Apply to the rash of the right side of the face   CALCIUM 500/D 500-200 MG-UNIT tablet Generic drug:  calcium-vitamin D Take 1 tablet by mouth daily with breakfast.   ciprofloxacin 0.3 % ophthalmic solution Commonly known as:  CILOXAN Place 1 drop into both eyes every 4 (four) hours while awake.   divalproex 500 MG 24 hr tablet Commonly known as:  DEPAKOTE ER 3 tabs at bedtime   ELIQUIS 5 MG Tabs tablet Generic drug:  apixaban Take 2.5 mg by mouth 2 (two) times daily.   folic acid 1 MG tablet Commonly known as:  FOLVITE Take 1 mg by mouth daily.   furosemide 20 MG tablet Commonly known as:  LASIX Take 1 tablet (20 mg total) by mouth daily.   hydrocortisone cream 1 % Apply on affected areas on face and behind ears twice daily for 14 days.   ketoconazole 2 % shampoo Commonly known as:  NIZORAL Lather and massage into scalp 2-3 times per week. Leave for 5 minutes before rinsing.   methotrexate 2.5 MG tablet Commonly known as:  RHEUMATREX Take 17.5 mg by mouth once a week. Reported on 12/06/2015   mirabegron ER 50 MG Tb24 tablet Commonly known as:  MYRBETRIQ Take 50 mg by mouth daily.   nystatin 100000 UNIT/ML suspension Commonly known as:  MYCOSTATIN Take 5 mLs (500,000 Units total) by mouth 4 (four) times daily.   Omega-3 1000 MG Caps Take 1 capsule by mouth daily.   omeprazole 20 MG capsule Commonly known as:  PRILOSEC Take 20 mg by mouth every morning.   oxybutynin 5 MG tablet Commonly known as:  DITROPAN Take 5 mg by mouth 2 (two) times daily.   OXYGEN Inhale into the lungs. To use if O2 drops below 85 %   predniSONE 5 MG tablet Commonly known as:  DELTASONE Take 5 mg by mouth daily with breakfast.   traMADol 50 MG tablet Commonly known as:  ULTRAM Take 1 tablet (50 mg total) by mouth every 6 (six) hours as needed.   vitamin C 500 MG tablet Commonly known as:  ASCORBIC  ACID Take 500 mg by mouth daily.   XELJANZ XR 11 MG Tb24 Generic drug:  Tofacitinib Citrate Take 1 tablet by mouth daily.  ziprasidone 80 MG capsule Commonly known as:  GEODON Take 80 mg by mouth daily.          Objective:   Physical Exam BP 116/74 (BP Location: Left Arm, Patient Position: Sitting, Cuff Size: Small)   Pulse 69   Temp 97.8 F (36.6 C) (Oral)   Resp 14   Ht 5\' 1"  (1.549 m)   SpO2 93%   BMI 43.27 kg/m   General:   Well developed, well nourished . NAD.  HEENT:  Normocephalic . Face  Atraumatic. Mouth: Slightly dry mouth but otherwise normal  Parotids: Small, symmetric, nontender.  No LADs MSK: Severe deformities at the upper and lower extremities consistent with RA Skin: Not pale. Not jaundice  Neurologic:  alert & oriented X3.  Speech normal, gait not tested.  Patient is essentially wheelchair-bound, unable to transfer   Psych--  Cognition and judgment appear intact.  Cooperative with normal attention span and concentration.   No anxious or depressed appearing.      Assessment & Plan:    Assessment   (new pt, transfer from [facility],02-2015) Hypertension Hyperlipidemia Ao stenosis, severe: Dr Burt Knack MSK: --Seronegative Rheumatoid arthritis-- Dr Lenna Gilford  --DJD-- DR Tamera Punt , dr Ronnie Derby --low back pain d/t DJD, Dr Sherlyn Lick  PSYCH: Schizophrenia, bipolar, depression: Follow-up at the [facility] in [facility] GU: Bladder cancer-transitional cell, Dr. Shona Needles GI:   GERD, history of colitis, history of gastritis,  colon polyps- had a virtual cscope 2016, + polyps, declined further eval Pulmomany: Dr Ashok Cordia --Sleep apnea:  Cpap intolerant --CT nodules f/u 2 pulmonary  --hypoxemia, dx 01-2017, unable to get O2 from medicare, eventually got O2 home-portable ~08-2017 L LEG DVT 02-2016 Varicose veins: s/p remote surgery, has LE wounds  H/o  iron deficiency anemia At the [place]: sees a dentist, PCP, psych, Ortho, pain mngmt   PLAN  See last visit:  acute parotitis:  Resolved.  Status post antibiotics Conjunctivitis: Saw an ophthalmologist at the [facility], was prescribed a number of drops, better. Throat irritation: Resolved Rash: Improved DJD, RA: He is back taking his routine medications, continue reporting pain, mostly at the right elbow and left hip anteriorly.  Was prescribed a small amount of Ultram by the ER doctor and it helped.  Request a refill which I granted.  Recommend to take Ultram 50 mg  either half or 1 tablet a day. Also, rec to d/w  his psychiatrist Ultram used to be sure there is no problems related to his other meds. RTC 6 months

## 2017-11-17 NOTE — Progress Notes (Signed)
Pre visit review using our clinic review tool, if applicable. No additional management support is needed unless otherwise documented below in the visit note. 

## 2017-11-17 NOTE — Telephone Encounter (Signed)
Pt seen today

## 2017-11-17 NOTE — Patient Instructions (Signed)
   GO TO THE FRONT DESK Schedule your next appointment for a checkup in 6 months  Tramadol: Take half or 1 tablet a day for pain.  Please talk with your psychiatrist about that medication.

## 2017-11-18 NOTE — Assessment & Plan Note (Signed)
See last visit:  acute parotitis: Resolved.  Status post antibiotics Conjunctivitis: Saw an ophthalmologist at the [facility], was prescribed a number of drops, better. Throat irritation: Resolved Rash: Improved DJD, RA: He is back taking his routine medications, continue reporting pain, mostly at the right elbow and left hip anteriorly.  Was prescribed a small amount of Ultram by the ER doctor and it helped.  Request a refill which I granted.  Recommend to take Ultram 50 mg  either half or 1 tablet a day. Also, rec to d/w  his psychiatrist Ultram used to be sure there is no problems related to his other meds. RTC 6 months

## 2017-11-19 DIAGNOSIS — I890 Lymphedema, not elsewhere classified: Secondary | ICD-10-CM | POA: Diagnosis not present

## 2017-11-19 DIAGNOSIS — I87312 Chronic venous hypertension (idiopathic) with ulcer of left lower extremity: Secondary | ICD-10-CM | POA: Diagnosis not present

## 2017-11-19 DIAGNOSIS — L97221 Non-pressure chronic ulcer of left calf limited to breakdown of skin: Secondary | ICD-10-CM | POA: Diagnosis not present

## 2017-11-19 DIAGNOSIS — I872 Venous insufficiency (chronic) (peripheral): Secondary | ICD-10-CM | POA: Diagnosis not present

## 2017-11-19 DIAGNOSIS — L97512 Non-pressure chronic ulcer of other part of right foot with fat layer exposed: Secondary | ICD-10-CM | POA: Diagnosis not present

## 2017-11-20 ENCOUNTER — Telehealth: Payer: Self-pay | Admitting: Internal Medicine

## 2017-11-20 NOTE — Telephone Encounter (Signed)
[place], Pt's wife to return call at her convenience. Okay for [facility] to discuss w/ her.

## 2017-11-20 NOTE — Telephone Encounter (Signed)
Copied from [place] [id number]. Topic: Quick Communication - See Telephone Encounter >> Nov 20, 2017 10:26 AM Cleaster Corin, NT wrote: CRM for notification. See Telephone encounter for: 11/20/17.  Pt. Wife calling to speak with nurse about paper work that she is filling out for insurance (dates needed and meds.) callback [phone]

## 2017-12-03 DIAGNOSIS — M79672 Pain in left foot: Secondary | ICD-10-CM | POA: Diagnosis not present

## 2017-12-03 DIAGNOSIS — I890 Lymphedema, not elsewhere classified: Secondary | ICD-10-CM | POA: Diagnosis not present

## 2017-12-03 DIAGNOSIS — I87312 Chronic venous hypertension (idiopathic) with ulcer of left lower extremity: Secondary | ICD-10-CM | POA: Diagnosis not present

## 2017-12-03 DIAGNOSIS — I872 Venous insufficiency (chronic) (peripheral): Secondary | ICD-10-CM | POA: Diagnosis not present

## 2017-12-03 DIAGNOSIS — L97512 Non-pressure chronic ulcer of other part of right foot with fat layer exposed: Secondary | ICD-10-CM | POA: Diagnosis not present

## 2017-12-03 DIAGNOSIS — M79671 Pain in right foot: Secondary | ICD-10-CM | POA: Diagnosis not present

## 2017-12-03 DIAGNOSIS — L97919 Non-pressure chronic ulcer of unspecified part of right lower leg with unspecified severity: Secondary | ICD-10-CM | POA: Diagnosis not present

## 2017-12-03 DIAGNOSIS — L97221 Non-pressure chronic ulcer of left calf limited to breakdown of skin: Secondary | ICD-10-CM | POA: Diagnosis not present

## 2017-12-03 DIAGNOSIS — B351 Tinea unguium: Secondary | ICD-10-CM | POA: Diagnosis not present

## 2017-12-03 DIAGNOSIS — L89890 Pressure ulcer of other site, unstageable: Secondary | ICD-10-CM | POA: Diagnosis not present

## 2017-12-17 DIAGNOSIS — Z6838 Body mass index (BMI) 38.0-38.9, adult: Secondary | ICD-10-CM | POA: Diagnosis not present

## 2017-12-17 DIAGNOSIS — M0589 Other rheumatoid arthritis with rheumatoid factor of multiple sites: Secondary | ICD-10-CM | POA: Diagnosis not present

## 2017-12-17 DIAGNOSIS — M150 Primary generalized (osteo)arthritis: Secondary | ICD-10-CM | POA: Diagnosis not present

## 2017-12-17 DIAGNOSIS — G894 Chronic pain syndrome: Secondary | ICD-10-CM | POA: Diagnosis not present

## 2017-12-17 DIAGNOSIS — M2550 Pain in unspecified joint: Secondary | ICD-10-CM | POA: Diagnosis not present

## 2017-12-17 DIAGNOSIS — Z79899 Other long term (current) drug therapy: Secondary | ICD-10-CM | POA: Diagnosis not present

## 2017-12-18 DIAGNOSIS — L89890 Pressure ulcer of other site, unstageable: Secondary | ICD-10-CM | POA: Diagnosis not present

## 2017-12-18 DIAGNOSIS — I890 Lymphedema, not elsewhere classified: Secondary | ICD-10-CM | POA: Diagnosis not present

## 2017-12-18 DIAGNOSIS — I872 Venous insufficiency (chronic) (peripheral): Secondary | ICD-10-CM | POA: Diagnosis not present

## 2017-12-18 DIAGNOSIS — I87312 Chronic venous hypertension (idiopathic) with ulcer of left lower extremity: Secondary | ICD-10-CM | POA: Diagnosis not present

## 2017-12-18 DIAGNOSIS — L97221 Non-pressure chronic ulcer of left calf limited to breakdown of skin: Secondary | ICD-10-CM | POA: Diagnosis not present

## 2017-12-18 DIAGNOSIS — L97512 Non-pressure chronic ulcer of other part of right foot with fat layer exposed: Secondary | ICD-10-CM | POA: Diagnosis not present

## 2017-12-24 ENCOUNTER — Other Ambulatory Visit: Payer: Self-pay

## 2017-12-24 ENCOUNTER — Encounter (HOSPITAL_BASED_OUTPATIENT_CLINIC_OR_DEPARTMENT_OTHER): Payer: Self-pay

## 2017-12-24 ENCOUNTER — Emergency Department (HOSPITAL_BASED_OUTPATIENT_CLINIC_OR_DEPARTMENT_OTHER)
Admission: EM | Admit: 2017-12-24 | Discharge: 2017-12-24 | Disposition: A | Discharge status: Home / Self Care | Payer: Medicare Other | Attending: Emergency Medicine | Admitting: Emergency Medicine

## 2017-12-24 ENCOUNTER — Emergency Department (HOSPITAL_BASED_OUTPATIENT_CLINIC_OR_DEPARTMENT_OTHER): Payer: Medicare Other

## 2017-12-24 DIAGNOSIS — S4992XA Unspecified injury of left shoulder and upper arm, initial encounter: Secondary | ICD-10-CM | POA: Diagnosis not present

## 2017-12-24 DIAGNOSIS — Z7982 Long term (current) use of aspirin: Secondary | ICD-10-CM | POA: Insufficient documentation

## 2017-12-24 DIAGNOSIS — Z79899 Other long term (current) drug therapy: Secondary | ICD-10-CM | POA: Insufficient documentation

## 2017-12-24 DIAGNOSIS — R0602 Shortness of breath: Secondary | ICD-10-CM | POA: Insufficient documentation

## 2017-12-24 DIAGNOSIS — F259 Schizoaffective disorder, unspecified: Secondary | ICD-10-CM | POA: Insufficient documentation

## 2017-12-24 DIAGNOSIS — Z7401 Bed confinement status: Secondary | ICD-10-CM | POA: Diagnosis not present

## 2017-12-24 DIAGNOSIS — F329 Major depressive disorder, single episode, unspecified: Secondary | ICD-10-CM | POA: Insufficient documentation

## 2017-12-24 DIAGNOSIS — Z96653 Presence of artificial knee joint, bilateral: Secondary | ICD-10-CM | POA: Insufficient documentation

## 2017-12-24 DIAGNOSIS — Z8551 Personal history of malignant neoplasm of bladder: Secondary | ICD-10-CM | POA: Insufficient documentation

## 2017-12-24 DIAGNOSIS — I10 Essential (primary) hypertension: Secondary | ICD-10-CM | POA: Insufficient documentation

## 2017-12-24 DIAGNOSIS — S59902A Unspecified injury of left elbow, initial encounter: Secondary | ICD-10-CM | POA: Diagnosis not present

## 2017-12-24 DIAGNOSIS — T1490XA Injury, unspecified, initial encounter: Secondary | ICD-10-CM

## 2017-12-24 DIAGNOSIS — Z7901 Long term (current) use of anticoagulants: Secondary | ICD-10-CM | POA: Insufficient documentation

## 2017-12-24 DIAGNOSIS — Z87891 Personal history of nicotine dependence: Secondary | ICD-10-CM | POA: Insufficient documentation

## 2017-12-24 DIAGNOSIS — M25512 Pain in left shoulder: Secondary | ICD-10-CM | POA: Insufficient documentation

## 2017-12-24 DIAGNOSIS — R079 Chest pain, unspecified: Secondary | ICD-10-CM | POA: Diagnosis not present

## 2017-12-24 DIAGNOSIS — L03115 Cellulitis of right lower limb: Secondary | ICD-10-CM

## 2017-12-24 DIAGNOSIS — M25522 Pain in left elbow: Secondary | ICD-10-CM | POA: Diagnosis not present

## 2017-12-24 DIAGNOSIS — M2550 Pain in unspecified joint: Secondary | ICD-10-CM | POA: Diagnosis not present

## 2017-12-24 HISTORY — DX: Venous insufficiency (chronic) (peripheral): I87.2

## 2017-12-24 HISTORY — DX: Lymphedema, not elsewhere classified: I89.0

## 2017-12-24 LAB — CBC WITH DIFFERENTIAL/PLATELET
BASOS ABS: 0 10*3/uL (ref 0.0–0.1)
Basophils Relative: 0 %
EOS ABS: 0 10*3/uL (ref 0.0–0.7)
EOS PCT: 0 %
HCT: 42.2 % (ref 39.0–52.0)
Hemoglobin: 14.3 g/dL (ref 13.0–17.0)
LYMPHS PCT: 7 %
Lymphs Abs: 0.8 10*3/uL (ref 0.7–4.0)
MCH: 32 pg (ref 26.0–34.0)
MCHC: 33.9 g/dL (ref 30.0–36.0)
MCV: 94.4 fL (ref 78.0–100.0)
Monocytes Absolute: 0.7 10*3/uL (ref 0.1–1.0)
Monocytes Relative: 6 %
Neutro Abs: 10.5 10*3/uL — ABNORMAL HIGH (ref 1.7–7.7)
Neutrophils Relative %: 87 %
PLATELETS: 125 10*3/uL — AB (ref 150–400)
RBC: 4.47 MIL/uL (ref 4.22–5.81)
RDW: 16.3 % — ABNORMAL HIGH (ref 11.5–15.5)
WBC: 12 10*3/uL — AB (ref 4.0–10.5)

## 2017-12-24 LAB — BASIC METABOLIC PANEL
ANION GAP: 7 (ref 5–15)
BUN: 25 mg/dL — ABNORMAL HIGH (ref 6–20)
CO2: 30 mmol/L (ref 22–32)
Calcium: 9 mg/dL (ref 8.9–10.3)
Chloride: 100 mmol/L — ABNORMAL LOW (ref 101–111)
Creatinine, Ser: 0.7 mg/dL (ref 0.61–1.24)
GFR calc Af Amer: >60 mL/min (ref >60)
Glucose, Bld: 111 mg/dL — ABNORMAL HIGH (ref 65–99)
Potassium: 4.3 mmol/L (ref 3.5–5.1)
SODIUM: 137 mmol/L (ref 135–145)

## 2017-12-24 LAB — BRAIN NATRIURETIC PEPTIDE: B NATRIURETIC PEPTIDE 5: 266.4 pg/mL — AB (ref 0.0–100.0)

## 2017-12-24 MED ORDER — CLINDAMYCIN HCL 300 MG PO CAPS: 300.0000 mg | ORAL_CAPSULE | Freq: Three times a day (TID) | ORAL | 0 refills | Status: DC

## 2017-12-24 MED ORDER — OXYCODONE-ACETAMINOPHEN 5-325 MG PO TABS
1.0000 | ORAL_TABLET | Freq: Once | ORAL | Status: AC
  Administered 2017-12-24: 1 via ORAL
  Filled 2017-12-24: qty 1

## 2017-12-24 MED ORDER — CLINDAMYCIN HCL 150 MG PO CAPS
300.0000 mg | ORAL_CAPSULE | Freq: Once | ORAL | Status: AC
  Administered 2017-12-24: 300 mg via ORAL
  Filled 2017-12-24: qty 2

## 2017-12-24 NOTE — ED Notes (Signed)
[facility] called for transport home. 

## 2017-12-24 NOTE — Discharge Instructions (Signed)
Return immediately for worsening redness, high fever >101.5, vomiting, confusion or other concerns.

## 2017-12-24 NOTE — ED Notes (Signed)
Transport called via [employer] dispatch @ 2239.
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
## 2017-12-25 ENCOUNTER — Other Ambulatory Visit: Payer: Self-pay

## 2017-12-25 ENCOUNTER — Encounter (HOSPITAL_BASED_OUTPATIENT_CLINIC_OR_DEPARTMENT_OTHER): Payer: Self-pay

## 2017-12-25 ENCOUNTER — Inpatient Hospital Stay (HOSPITAL_BASED_OUTPATIENT_CLINIC_OR_DEPARTMENT_OTHER)
Admission: EM | Admit: 2017-12-25 | Discharge: 2017-12-31 | DRG: 871 | Disposition: A | Discharge status: Home Health | Payer: Medicare Other | Attending: Family Medicine | Admitting: Family Medicine

## 2017-12-25 ENCOUNTER — Emergency Department (HOSPITAL_BASED_OUTPATIENT_CLINIC_OR_DEPARTMENT_OTHER): Payer: Medicare Other

## 2017-12-25 ENCOUNTER — Inpatient Hospital Stay (HOSPITAL_COMMUNITY): Payer: Medicare Other

## 2017-12-25 DIAGNOSIS — Z7901 Long term (current) use of anticoagulants: Secondary | ICD-10-CM

## 2017-12-25 DIAGNOSIS — I350 Nonrheumatic aortic (valve) stenosis: Secondary | ICD-10-CM | POA: Diagnosis not present

## 2017-12-25 DIAGNOSIS — T82838A Hemorrhage of vascular prosthetic devices, implants and grafts, initial encounter: Secondary | ICD-10-CM | POA: Diagnosis not present

## 2017-12-25 DIAGNOSIS — Z86718 Personal history of other venous thrombosis and embolism: Secondary | ICD-10-CM

## 2017-12-25 DIAGNOSIS — A419 Sepsis, unspecified organism: Secondary | ICD-10-CM

## 2017-12-25 DIAGNOSIS — I5030 Unspecified diastolic (congestive) heart failure: Secondary | ICD-10-CM | POA: Diagnosis not present

## 2017-12-25 DIAGNOSIS — R7881 Bacteremia: Secondary | ICD-10-CM | POA: Diagnosis not present

## 2017-12-25 DIAGNOSIS — R4182 Altered mental status, unspecified: Secondary | ICD-10-CM | POA: Diagnosis not present

## 2017-12-25 DIAGNOSIS — G4730 Sleep apnea, unspecified: Secondary | ICD-10-CM | POA: Diagnosis present

## 2017-12-25 DIAGNOSIS — L03115 Cellulitis of right lower limb: Secondary | ICD-10-CM | POA: Diagnosis not present

## 2017-12-25 DIAGNOSIS — R0902 Hypoxemia: Secondary | ICD-10-CM | POA: Diagnosis not present

## 2017-12-25 DIAGNOSIS — Z8249 Family history of ischemic heart disease and other diseases of the circulatory system: Secondary | ICD-10-CM | POA: Diagnosis not present

## 2017-12-25 DIAGNOSIS — Z8551 Personal history of malignant neoplasm of bladder: Secondary | ICD-10-CM | POA: Diagnosis not present

## 2017-12-25 DIAGNOSIS — W19XXXA Unspecified fall, initial encounter: Secondary | ICD-10-CM | POA: Diagnosis not present

## 2017-12-25 DIAGNOSIS — I4510 Unspecified right bundle-branch block: Secondary | ICD-10-CM | POA: Diagnosis not present

## 2017-12-25 DIAGNOSIS — G8929 Other chronic pain: Secondary | ICD-10-CM | POA: Diagnosis present

## 2017-12-25 DIAGNOSIS — Z79899 Other long term (current) drug therapy: Secondary | ICD-10-CM

## 2017-12-25 DIAGNOSIS — Z66 Do not resuscitate: Secondary | ICD-10-CM | POA: Diagnosis present

## 2017-12-25 DIAGNOSIS — Y658 Other specified misadventures during surgical and medical care: Secondary | ICD-10-CM | POA: Diagnosis not present

## 2017-12-25 DIAGNOSIS — M1612 Unilateral primary osteoarthritis, left hip: Secondary | ICD-10-CM | POA: Diagnosis present

## 2017-12-25 DIAGNOSIS — R011 Cardiac murmur, unspecified: Secondary | ICD-10-CM | POA: Diagnosis not present

## 2017-12-25 DIAGNOSIS — D696 Thrombocytopenia, unspecified: Secondary | ICD-10-CM | POA: Diagnosis present

## 2017-12-25 DIAGNOSIS — Z885 Allergy status to narcotic agent status: Secondary | ICD-10-CM | POA: Diagnosis not present

## 2017-12-25 DIAGNOSIS — M069 Rheumatoid arthritis, unspecified: Secondary | ICD-10-CM | POA: Diagnosis present

## 2017-12-25 DIAGNOSIS — Z87891 Personal history of nicotine dependence: Secondary | ICD-10-CM

## 2017-12-25 DIAGNOSIS — S3991XA Unspecified injury of abdomen, initial encounter: Secondary | ICD-10-CM | POA: Diagnosis not present

## 2017-12-25 DIAGNOSIS — A4101 Sepsis due to Methicillin susceptible Staphylococcus aureus: Secondary | ICD-10-CM | POA: Diagnosis not present

## 2017-12-25 DIAGNOSIS — S299XXA Unspecified injury of thorax, initial encounter: Secondary | ICD-10-CM | POA: Diagnosis not present

## 2017-12-25 DIAGNOSIS — Z888 Allergy status to other drugs, medicaments and biological substances status: Secondary | ICD-10-CM | POA: Diagnosis not present

## 2017-12-25 DIAGNOSIS — S59902A Unspecified injury of left elbow, initial encounter: Secondary | ICD-10-CM | POA: Diagnosis not present

## 2017-12-25 DIAGNOSIS — T829XXA Unspecified complication of cardiac and vascular prosthetic device, implant and graft, initial encounter: Secondary | ICD-10-CM | POA: Diagnosis not present

## 2017-12-25 DIAGNOSIS — F250 Schizoaffective disorder, bipolar type: Secondary | ICD-10-CM | POA: Diagnosis present

## 2017-12-25 DIAGNOSIS — G9341 Metabolic encephalopathy: Secondary | ICD-10-CM | POA: Diagnosis present

## 2017-12-25 DIAGNOSIS — Z96653 Presence of artificial knee joint, bilateral: Secondary | ICD-10-CM | POA: Diagnosis present

## 2017-12-25 DIAGNOSIS — M25512 Pain in left shoulder: Secondary | ICD-10-CM | POA: Diagnosis not present

## 2017-12-25 DIAGNOSIS — M25522 Pain in left elbow: Secondary | ICD-10-CM | POA: Diagnosis not present

## 2017-12-25 DIAGNOSIS — R52 Pain, unspecified: Secondary | ICD-10-CM | POA: Diagnosis not present

## 2017-12-25 DIAGNOSIS — Z9981 Dependence on supplemental oxygen: Secondary | ICD-10-CM | POA: Diagnosis not present

## 2017-12-25 DIAGNOSIS — I872 Venous insufficiency (chronic) (peripheral): Secondary | ICD-10-CM | POA: Diagnosis not present

## 2017-12-25 DIAGNOSIS — S4992XA Unspecified injury of left shoulder and upper arm, initial encounter: Secondary | ICD-10-CM | POA: Diagnosis not present

## 2017-12-25 DIAGNOSIS — B9562 Methicillin resistant Staphylococcus aureus infection as the cause of diseases classified elsewhere: Secondary | ICD-10-CM | POA: Diagnosis not present

## 2017-12-25 DIAGNOSIS — Z823 Family history of stroke: Secondary | ICD-10-CM | POA: Diagnosis not present

## 2017-12-25 DIAGNOSIS — L03116 Cellulitis of left lower limb: Secondary | ICD-10-CM | POA: Diagnosis present

## 2017-12-25 DIAGNOSIS — M879 Osteonecrosis, unspecified: Secondary | ICD-10-CM | POA: Diagnosis present

## 2017-12-25 DIAGNOSIS — B9561 Methicillin susceptible Staphylococcus aureus infection as the cause of diseases classified elsewhere: Secondary | ICD-10-CM | POA: Diagnosis not present

## 2017-12-25 DIAGNOSIS — Z7982 Long term (current) use of aspirin: Secondary | ICD-10-CM

## 2017-12-25 DIAGNOSIS — R079 Chest pain, unspecified: Secondary | ICD-10-CM | POA: Diagnosis not present

## 2017-12-25 DIAGNOSIS — I82402 Acute embolism and thrombosis of unspecified deep veins of left lower extremity: Secondary | ICD-10-CM | POA: Diagnosis present

## 2017-12-25 DIAGNOSIS — S0990XA Unspecified injury of head, initial encounter: Secondary | ICD-10-CM | POA: Diagnosis not present

## 2017-12-25 DIAGNOSIS — M1990 Unspecified osteoarthritis, unspecified site: Secondary | ICD-10-CM | POA: Diagnosis not present

## 2017-12-25 DIAGNOSIS — I10 Essential (primary) hypertension: Secondary | ICD-10-CM | POA: Diagnosis not present

## 2017-12-25 DIAGNOSIS — R4020 Unspecified coma: Secondary | ICD-10-CM | POA: Diagnosis not present

## 2017-12-25 LAB — COMPREHENSIVE METABOLIC PANEL
ALBUMIN: 3.2 g/dL — AB (ref 3.5–5.0)
ALK PHOS: 64 U/L (ref 38–126)
ALT: 17 U/L (ref 17–63)
ANION GAP: 9 (ref 5–15)
AST: 31 U/L (ref 15–41)
BILIRUBIN TOTAL: 1.1 mg/dL (ref 0.3–1.2)
BUN: 23 mg/dL — AB (ref 6–20)
CALCIUM: 8.7 mg/dL — AB (ref 8.9–10.3)
CO2: 27 mmol/L (ref 22–32)
Chloride: 99 mmol/L — ABNORMAL LOW (ref 101–111)
Creatinine, Ser: 0.74 mg/dL (ref 0.61–1.24)
GFR calc Af Amer: >60 mL/min (ref >60)
Glucose, Bld: 101 mg/dL — ABNORMAL HIGH (ref 65–99)
Potassium: 4.1 mmol/L (ref 3.5–5.1)
Sodium: 135 mmol/L (ref 135–145)
Total Protein: 7.3 g/dL (ref 6.5–8.1)

## 2017-12-25 LAB — BLOOD CULTURE ID PANEL (REFLEXED)
ACINETOBACTER BAUMANNII: NOT DETECTED
CANDIDA ALBICANS: NOT DETECTED
CANDIDA TROPICALIS: NOT DETECTED
Candida glabrata: NOT DETECTED
Candida krusei: NOT DETECTED
Candida parapsilosis: NOT DETECTED
ENTEROBACTERIACEAE SPECIES: NOT DETECTED
ENTEROCOCCUS SPECIES: NOT DETECTED
Enterobacter cloacae complex: NOT DETECTED
Escherichia coli: NOT DETECTED
HAEMOPHILUS INFLUENZAE: NOT DETECTED
Klebsiella oxytoca: NOT DETECTED
Klebsiella pneumoniae: NOT DETECTED
LISTERIA MONOCYTOGENES: NOT DETECTED
METHICILLIN RESISTANCE: NOT DETECTED
NEISSERIA MENINGITIDIS: NOT DETECTED
PSEUDOMONAS AERUGINOSA: NOT DETECTED
Proteus species: NOT DETECTED
STREPTOCOCCUS AGALACTIAE: NOT DETECTED
STREPTOCOCCUS PYOGENES: NOT DETECTED
STREPTOCOCCUS SPECIES: NOT DETECTED
Serratia marcescens: NOT DETECTED
Staphylococcus aureus (BCID): DETECTED — AB
Staphylococcus species: DETECTED — AB
Streptococcus pneumoniae: NOT DETECTED

## 2017-12-25 LAB — CBC WITH DIFFERENTIAL/PLATELET
BASOS ABS: 0 10*3/uL (ref 0.0–0.1)
BASOS PCT: 0 %
EOS PCT: 0 %
Eosinophils Absolute: 0 10*3/uL (ref 0.0–0.7)
HCT: 39.9 % (ref 39.0–52.0)
Hemoglobin: 13.6 g/dL (ref 13.0–17.0)
Lymphocytes Relative: 8 %
Lymphs Abs: 0.9 10*3/uL (ref 0.7–4.0)
MCH: 31.9 pg (ref 26.0–34.0)
MCHC: 34.1 g/dL (ref 30.0–36.0)
MCV: 93.7 fL (ref 78.0–100.0)
MONO ABS: 0.9 10*3/uL (ref 0.1–1.0)
Monocytes Relative: 8 %
NEUTROS ABS: 9 10*3/uL — AB (ref 1.7–7.7)
Neutrophils Relative %: 84 %
PLATELETS: 108 10*3/uL — AB (ref 150–400)
RBC: 4.26 MIL/uL (ref 4.22–5.81)
RDW: 16.2 % — AB (ref 11.5–15.5)
WBC: 10.7 10*3/uL — AB (ref 4.0–10.5)

## 2017-12-25 LAB — URINALYSIS, ROUTINE W REFLEX MICROSCOPIC
Bilirubin Urine: NEGATIVE
Glucose, UA: NEGATIVE mg/dL
KETONES UR: 15 mg/dL — AB
Leukocytes, UA: NEGATIVE
NITRITE: NEGATIVE
PROTEIN: NEGATIVE mg/dL
Specific Gravity, Urine: 1.02 (ref 1.005–1.030)
pH: 6.5 (ref 5.0–8.0)

## 2017-12-25 LAB — URINALYSIS, MICROSCOPIC (REFLEX)

## 2017-12-25 LAB — I-STAT CG4 LACTIC ACID, ED: Lactic Acid, Venous: 0.81 mmol/L (ref 0.5–1.9)

## 2017-12-25 MED ORDER — VANCOMYCIN HCL 1000 MG IV SOLR
INTRAVENOUS | Status: AC
  Filled 2017-12-25: qty 2000

## 2017-12-25 MED ORDER — ACETAMINOPHEN 325 MG PO TABS
650.0000 mg | ORAL_TABLET | Freq: Four times a day (QID) | ORAL | Status: DC | PRN
  Administered 2017-12-28: 650 mg via ORAL
  Filled 2017-12-25: qty 2

## 2017-12-25 MED ORDER — VANCOMYCIN HCL 10 G IV SOLR
2000.0000 mg | Freq: Once | INTRAVENOUS | Status: AC
  Administered 2017-12-25: 2000 mg via INTRAVENOUS
  Filled 2017-12-25: qty 2000

## 2017-12-25 MED ORDER — VANCOMYCIN HCL IN DEXTROSE 1-5 GM/200ML-% IV SOLN: 1000.0000 mg | Freq: Once | INTRAVENOUS | Status: DC

## 2017-12-25 MED ORDER — IOPAMIDOL (ISOVUE-300) INJECTION 61%
100.0000 mL | Freq: Once | INTRAVENOUS | Status: AC | PRN
  Administered 2017-12-25: 100 mL via INTRAVENOUS

## 2017-12-25 MED ORDER — ASPIRIN EC 81 MG PO TBEC
81.0000 mg | DELAYED_RELEASE_TABLET | Freq: Every day | ORAL | Status: DC
  Administered 2017-12-26 – 2017-12-31 (×6): 81 mg via ORAL
  Filled 2017-12-25 (×6): qty 1

## 2017-12-25 MED ORDER — HYDROCORTISONE NA SUCCINATE PF 100 MG IJ SOLR
50.0000 mg | Freq: Three times a day (TID) | INTRAMUSCULAR | Status: DC
  Administered 2017-12-25 – 2017-12-31 (×18): 50 mg via INTRAVENOUS
  Filled 2017-12-25 (×18): qty 2

## 2017-12-25 MED ORDER — HYDROCORTISONE NA SUCCINATE PF 100 MG IJ SOLR
100.0000 mg | Freq: Once | INTRAMUSCULAR | Status: AC
  Administered 2017-12-25: 100 mg via INTRAVENOUS
  Filled 2017-12-25: qty 2

## 2017-12-25 MED ORDER — ENOXAPARIN SODIUM 100 MG/ML ~~LOC~~ SOLN
95.0000 mg | Freq: Two times a day (BID) | SUBCUTANEOUS | Status: DC
  Administered 2017-12-25 – 2017-12-29 (×7): 95 mg via SUBCUTANEOUS
  Filled 2017-12-25: qty 0.95
  Filled 2017-12-25 (×3): qty 1
  Filled 2017-12-25 (×2): qty 0.95
  Filled 2017-12-25: qty 1
  Filled 2017-12-25: qty 0.95

## 2017-12-25 MED ORDER — PIPERACILLIN-TAZOBACTAM 3.375 G IVPB 30 MIN
3.3750 g | Freq: Once | INTRAVENOUS | Status: AC
  Administered 2017-12-25: 3.375 g via INTRAVENOUS
  Filled 2017-12-25 (×2): qty 50

## 2017-12-25 MED ORDER — PIPERACILLIN-TAZOBACTAM 3.375 G IVPB
3.3750 g | Freq: Three times a day (TID) | INTRAVENOUS | Status: DC
  Administered 2017-12-25 – 2017-12-26 (×2): 3.375 g via INTRAVENOUS
  Filled 2017-12-25 (×2): qty 50

## 2017-12-25 MED ORDER — FENTANYL CITRATE (PF) 100 MCG/2ML IJ SOLN: 12.5000 ug | INTRAMUSCULAR | Status: DC | PRN

## 2017-12-25 MED ORDER — SODIUM CHLORIDE 0.9 % IV BOLUS
1000.0000 mL | Freq: Once | INTRAVENOUS | Status: AC
  Administered 2017-12-25: 1000 mL via INTRAVENOUS

## 2017-12-25 MED ORDER — ZIPRASIDONE HCL 80 MG PO CAPS
80.0000 mg | ORAL_CAPSULE | Freq: Every day | ORAL | Status: DC
  Administered 2017-12-26: 80 mg via ORAL
  Filled 2017-12-25: qty 1

## 2017-12-25 MED ORDER — ACETAMINOPHEN 650 MG RE SUPP: 650.0000 mg | Freq: Four times a day (QID) | RECTAL | Status: DC | PRN

## 2017-12-25 MED ORDER — ARIPIPRAZOLE 10 MG PO TABS
30.0000 mg | ORAL_TABLET | Freq: Every day | ORAL | Status: DC
  Administered 2017-12-25 – 2017-12-30 (×6): 30 mg via ORAL
  Filled 2017-12-25 (×6): qty 3

## 2017-12-25 MED ORDER — GABAPENTIN 300 MG PO CAPS
300.0000 mg | ORAL_CAPSULE | Freq: Every day | ORAL | Status: DC
  Administered 2017-12-26 – 2017-12-28 (×3): 300 mg via ORAL
  Filled 2017-12-25 (×3): qty 1

## 2017-12-25 MED ORDER — ONDANSETRON HCL 4 MG/2ML IJ SOLN: 4.0000 mg | Freq: Four times a day (QID) | INTRAMUSCULAR | Status: DC | PRN

## 2017-12-25 MED ORDER — DIVALPROEX SODIUM ER 250 MG PO TB24
1500.0000 mg | ORAL_TABLET | Freq: Two times a day (BID) | ORAL | Status: DC
  Administered 2017-12-25: 1500 mg via ORAL
  Filled 2017-12-25 (×2): qty 6

## 2017-12-25 MED ORDER — ONDANSETRON HCL 4 MG PO TABS: 4.0000 mg | ORAL_TABLET | Freq: Four times a day (QID) | ORAL | Status: DC | PRN

## 2017-12-25 MED ORDER — VANCOMYCIN HCL IN DEXTROSE 1-5 GM/200ML-% IV SOLN
1000.0000 mg | Freq: Three times a day (TID) | INTRAVENOUS | Status: DC
  Administered 2017-12-25 – 2017-12-26 (×2): 1000 mg via INTRAVENOUS
  Filled 2017-12-25 (×2): qty 200

## 2017-12-25 MED FILL — CLINDAMYCIN HCL 300 MG CAP: 300 | 7 days supply | Qty: 21 | Fill #0

## 2017-12-25 NOTE — ED Notes (Signed)
Pt on monitor 

## 2017-12-25 NOTE — Progress Notes (Signed)
72 yo male with RA, hypertension, hyperlipidemia, had fall yesterday and was found to be febrile, refused to be admitted and then returns today to ER. ER requesting admission for cellulitis. Right foot and medial ankle. sbp mid 90's.

## 2017-12-25 NOTE — ED Triage Notes (Signed)
Pt arrived via [facility]. Pt c/o cellulitis to lower legs with pain. Pt also having pain to L scapula after having a ground level fall. Pt was seen here last PM for same thing. Per EMS pt is back because pain and fever is increasing. EMS report temp of 102.7 and they admin 1000 mg of APAP. EMS had pt on 3L O2 and report he was mildly labored on scene.

## 2017-12-25 NOTE — ED Provider Notes (Signed)
[facility] EMERGENCY DEPARTMENT Provider Note   CSN: [id number] Arrival date & time: 12/25/17  1412     History   Chief Complaint Chief Complaint  Patient presents with  . Cellulitis    HPI Shaun Yoder is a 72 y.o. male.  The history is provided by the patient. No language interpreter was used.   Shaun Yoder is a 72 y.o. male who presents to the Emergency Department complaining of fever, confusion.  Level V caveat due to AMS. He presents to the emergency department accompanied by his wife for evaluation of fever and altered mental status. Yesterday he was in his motorized wheelchair when he fell forward, striking his left shoulder. He was evaluated in the emergency department at that time and noted to have cellulitis to his right leg it was started on IV antibiotics. He refused admission at that time and was discharged back home. Since discharge home his wife reports increasing confusion as well as intermittent agitation with fever to 101 at home this afternoon. Due to worsening condition she called 911. EMS administered acetaminophen prior to ED arrival. In the department patient reports pain to his left shoulder as well as his groin and left hip. He does have chronic pain in these areas. Past Medical History:  Diagnosis Date  . Anemia   . Cancer ([place])    bladder  . Chronic venous insufficiency   . Colitis   . Colon polyps   . Depression   . Diverticulosis   . DVT of deep femoral vein ([place])   . Gastritis   . GERD (gastroesophageal reflux disease)   . History of rectal polyps   . Hyperlipidemia   . Hypertension   . Iron deficiency   . Low back pain   . Lymphedema of both lower extremities   . OA (osteoarthritis)   . 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 Schizoaffective disorder, bipolar type ([place])   . Sleep apnea   . Varicose vein     Patient Active Problem List   Diagnosis Date Noted  . Cellulitis 08/29/2017  . Sepsis ([place]) 08/28/2017    . Nocturnal hypoxemia 02/11/2017  . Primary osteoarthritis of left hip 12/06/2016  . Chronic right shoulder pain 12/06/2016  . Contracture of joint of right shoulder region 12/06/2016  . Chronic left hip pain 09/12/2016  . Status post bilateral knee replacements 09/12/2016  . Gait disorder 06/03/2016  . Left leg DVT ([place]) 04/04/2016  . Non-pressure chronic ulcer of right ankle with fat layer exposed ([place]) 10/25/2015  . Varicose veins of lower extremities with ulcer ([place]) 09/07/2015  . Annual physical exam 05/23/2015  . PCP NOTES >>>>>>>>>>>>>>>>> 03/28/2015  . Hypertension 03/28/2015  . Pulmonary nodules 12/09/2014  . Sleep apnea 11/14/2014  . GERD (gastroesophageal reflux disease) 11/14/2014  . Morbid obesity ([place]) 11/14/2014  . Aortic stenosis, severe   . Schizoaffective disorder, bipolar type ([place])   . Rheumatoid arthritis ([place])   . Hyperlipidemia   . Bifasicular block   . Mononeuritis of upper limb 05/20/2013  . Pain in soft tissues of limb 05/20/2013  . 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 Depression 01/13/2013  . Memory loss 01/13/2013    Past Surgical History:  Procedure Laterality Date  . BLADDER SURGERY    . TOTAL KNEE ARTHROPLASTY Bilateral   . VARICOSE VEIN SURGERY          Home Medications    Prior to Admission medications   Medication Sig Start Date End  Date Taking? Authorizing Provider  acetaminophen-codeine (TYLENOL #3) 300-30 MG tablet Take 1 tablet by mouth every 4 (four) hours as needed for moderate pain.    [provider]  apixaban (ELIQUIS) 5 MG TABS tablet Take 2.5 mg by mouth 2 (two) times daily.    [provider]  ARIPiprazole (ABILIFY) 30 MG tablet Take 30 mg by mouth at bedtime.      [provider]  aspirin EC 81 MG tablet Take 81 mg by mouth daily.    [provider]  augmented betamethasone dipropionate (DIPROLENE-AF) 0.05 % cream Apply topically 2 (two) times daily. Apply to the rash of the right  side of the face 11/03/17   Colon Branch, MD  calcium-vitamin D (CALCIUM 500/D) 500-200 MG-UNIT tablet Take 1 tablet by mouth daily with breakfast.     [provider]  ciprofloxacin (CILOXAN) 0.3 % ophthalmic solution Place 1 drop into both eyes every 4 (four) hours while awake. 10/22/17   Colon Branch, MD  clindamycin (CLEOCIN) 300 MG capsule Take 1 capsule (300 mg total) by mouth 3 (three) times daily. 12/24/17   Blanchie Dessert, MD  divalproex (DEPAKOTE ER) 500 MG 24 hr tablet 3 tabs at bedtime    [provider]  folic acid (FOLVITE) 1 MG tablet Take 1 mg by mouth daily.      [provider]  furosemide (LASIX) 20 MG tablet Take 1 tablet (20 mg total) by mouth daily. 10/17/17   Colon Branch, MD  hydrocortisone cream 1 % Apply on affected areas on face and behind ears twice daily for 14 days. Patient taking differently: Apply on affected areas on face and behind ears twice daily as needed for rash 01/01/17   Wendling, Crosby Oyster, DO  ketoconazole (NIZORAL) 2 % shampoo Lather and massage into scalp 2-3 times per week. Leave for 5 minutes before rinsing. 07/07/17   Colon Branch, MD  methotrexate (RHEUMATREX) 2.5 MG tablet Take 17.5 mg by mouth once a week. Reported on 12/06/2015    [name], MD  mirabegron ER (MYRBETRIQ) 50 MG TB24 tablet Take 50 mg by mouth daily.    [provider]  nystatin (MYCOSTATIN) 100000 UNIT/ML suspension Take 5 mLs (500,000 Units total) by mouth 4 (four) times daily. 11/03/17   Colon Branch, MD  Omega-3 1000 MG CAPS Take 1 capsule by mouth daily.     [provider]  omeprazole (PRILOSEC) 20 MG capsule Take 20 mg by mouth every morning.     [provider]  oxybutynin (DITROPAN) 5 MG tablet Take 5 mg by mouth 2 (two) times daily.    [provider]  OXYGEN Inhale into the lungs. To use if O2 drops below 85 %    [provider]  predniSONE (DELTASONE) 5 MG tablet Take 5 mg by mouth daily with breakfast.     [provider]  Tofacitinib Citrate (XELJANZ XR) 11 MG TB24 Take 1 tablet by mouth daily.    [provider]  traMADol (ULTRAM) 50 MG tablet Take 1 tablet (50 mg total) by mouth every 6 (six) hours as needed. 11/17/17   Colon Branch, MD  vitamin C (ASCORBIC ACID) 500 MG tablet Take 500 mg by mouth daily.    [provider]  ziprasidone (GEODON) 80 MG capsule Take 80 mg by mouth daily.    [provider]    Family History Family History  Problem Relation Age of Onset  .  Heart attack Mother   . Heart attack Father   . Stroke Father   . Rheumatologic disease Neg Hx   . Colon cancer Neg Hx   . Lung disease Neg Hx   . Prostate cancer Neg Hx     Social History Social History   Tobacco Use  . Smoking status: Former Smoker    Packs/day: 2.50    Years: 43.00    Pack years: 107.50    Types: Cigarettes    Last attempt to quit: 07/22/1998    Years since quitting: 19.4  . Smokeless tobacco: Never Used  Substance Use Topics  . Alcohol use: Yes    Alcohol/week: 0.0 oz    Comment: Occasional glass of wine  . Drug use: No     Allergies   Leflunomide; Morphine and related; and Plaquenil [hydroxychloroquine sulfate]   Review of Systems Review of Systems  All other systems reviewed and are negative.    Physical Exam Updated Vital Signs BP (!) 90/57   Pulse 63   Temp (!) 102.4 F (39.1 C) (Oral)   Resp 19   Ht 5\' 7"  (1.702 m)   Wt 93.9 kg (207 lb)   SpO2 97%   BMI 32.42 kg/m   Physical Exam  Constitutional: He appears well-developed and well-nourished. He appears distressed.  Ill appearing  HENT:  Head: Normocephalic and atraumatic.  dry mucous membranes  Cardiovascular: Normal rate and regular rhythm.  Murmur heard. Pulmonary/Chest: Effort normal. No respiratory distress.  Decreased air movement and bilateral bases  Abdominal: Soft. There is no tenderness. There is no rebound and no guarding.  ecchymotic stripe across the upper  abdomen without any tenderness.  Musculoskeletal:  Erythema and edema to the right distal leg and foot. 2+ DP pulses bilaterally. Chronic venous stasis changes to bilateral lower extremities. Chronic deformities to bilateral hands, ankles.  Neurological:  Lethargic but irascible to verbal stimuli. Profound generalized weakness.  Skin: Skin is warm and dry.  Psychiatric: He has a normal mood and affect. His behavior is normal.  Nursing note and vitals reviewed.    ED Treatments / Results  Labs (all labs ordered are listed, but only abnormal results are displayed) Labs Reviewed  COMPREHENSIVE METABOLIC PANEL - Abnormal; Notable for the following components:      Result Value   Chloride 99 (*)    Glucose, Bld 101 (*)    BUN 23 (*)    Calcium 8.7 (*)    Albumin 3.2 (*)    All other components within normal limits  CBC WITH DIFFERENTIAL/PLATELET - Abnormal; Notable for the following components:   WBC 10.7 (*)    RDW 16.2 (*)    Platelets 108 (*)    Neutro Abs 9.0 (*)    All other components within normal limits  URINALYSIS, ROUTINE W REFLEX MICROSCOPIC - Abnormal; Notable for the following components:   Color, Urine AMBER (*)    Hgb urine dipstick TRACE (*)    Ketones, ur 15 (*)    All other components within normal limits  URINALYSIS, MICROSCOPIC (REFLEX) - Abnormal; Notable for the following components:   Bacteria, UA RARE (*)    All other components within normal limits  CULTURE, BLOOD (ROUTINE X 2)  CULTURE, BLOOD (ROUTINE X 2)  I-STAT CG4 LACTIC ACID, ED    EKG EKG Interpretation  Date/Time:  Thursday December 25 2017 14:26:01 EDT Ventricular Rate:  95 PR Interval:    QRS Duration: 186 QT Interval:  415 QTC Calculation:  [place] Axis:   -98 Text Interpretation:  Sinus rhythm Probable left atrial enlargement RBBB and LAFB Confirmed by Quintella Reichert [phone]) on 12/25/2017 2:31:35 PM   Radiology Dg Chest 1 View  Result Date: 12/24/2017 CLINICAL DATA:  Recent fall with  chest pain, initial encounter EXAM: CHEST  1 VIEW COMPARISON:  09/30/2017 FINDINGS: Cardiac shadow is stable. Bibasilar scarring is noted. Chronic changes about the shoulder joints are seen. No acute bony abnormality is noted. IMPRESSION: Chronic changes without acute abnormality. Electronically Signed   By: Inez Catalina M.D.   On: 12/24/2017 20:39   Dg Elbow Complete Left  Result Date: 12/24/2017 CLINICAL DATA:  Recent blunt trauma to elbow with pain, initial encounter EXAM: LEFT ELBOW - COMPLETE 3+ VIEW COMPARISON:  07/05/2010 FINDINGS: Mild degenerative changes of the elbow joint are noted. No acute fracture or dislocation is seen. No soft tissue abnormality is noted. IMPRESSION: Degenerative change without acute abnormality. Electronically Signed   By: Inez Catalina M.D.   On: 12/24/2017 20:39   Dg Shoulder Left  Result Date: 12/24/2017 CLINICAL DATA:  Left shoulder injury.  Hit door frame. EXAM: LEFT SHOULDER - 2+ VIEW COMPARISON:  None. FINDINGS: Severe deformity of the left shoulder with flattening of the humeral head and advanced degenerative changes. This is stable since prior chest x-ray. No acute fracture, subluxation or dislocation. IMPRESSION: Severe chronic deformity of the left glenohumeral joint as above with advanced degenerative changes. No acute bony abnormality. Electronically Signed   By: Rolm Baptise M.D.   On: 12/24/2017 20:37    Procedures Procedures (including critical care time) CRITICAL CARE Performed by: Quintella Reichert   Total critical care time: 35 minutes  Critical care time was exclusive of separately billable procedures and treating other patients.  Critical care was necessary to treat or prevent imminent or life-threatening deterioration.  Critical care was time spent personally by me on the following activities: development of treatment plan with patient and/or surrogate as well as nursing, discussions with consultants, evaluation of patient's response to  treatment, examination of patient, obtaining history from patient or surrogate, ordering and performing treatments and interventions, ordering and review of laboratory studies, ordering and review of radiographic studies, pulse oximetry and re-evaluation of patient's condition.  Medications Ordered in ED Medications  vancomycin (VANCOCIN) 2,000 mg in sodium chloride 0.9 % 500 mL IVPB (2,000 mg Intravenous New Bag/Given 12/25/17 1456)  vancomycin (VANCOCIN) 1000 MG powder (has no administration in time range)  vancomycin (VANCOCIN) IVPB 1000 mg/200 mL premix (has no administration in time range)  piperacillin-tazobactam (ZOSYN) IVPB 3.375 g (has no administration in time range)  sodium chloride 0.9 % bolus 1,000 mL (has no administration in time range)  piperacillin-tazobactam (ZOSYN) IVPB 3.375 g (3.375 g Intravenous New Bag/Given 12/25/17 1452)  hydrocortisone sodium succinate (SOLU-CORTEF) 100 MG injection 100 mg (100 mg Intravenous Given 12/25/17 1452)  iopamidol (ISOVUE-300) 61 % injection 100 mL (100 mLs Intravenous Contrast Given 12/25/17 1532)     Initial Impression / Assessment and Plan / ED Course  I have reviewed the triage vital signs and the nursing notes.  Pertinent labs & imaging results that were available during my care of the patient were reviewed by me and considered in my medical decision making (see chart for details).     Patient with history of arthritis, aortic stenosis, DVT on chronic anticoagulation as well as chronic immunosuppression with steroids here for evaluation of fever and altered mental status. He is ill appearing on examination. Leg  with erythema concerning for cellulitis. Given immunocompromised status he was started on broad-spectrum antibiotics for presumed sepsis. Gentle fluid hydration provided given history of severe aortic stenosis. He was treated with hydrocortisone for possible adrenal insufficiency due to chronic prednisone use. Given history of trauma  yesterday with ecchymosis across his abdominal wall CT head, chest and abdomen were obtained to further evaluate. Patient care transferred pending imaging and laboratory results.  Final Clinical Impressions(s) / ED Diagnoses   Final diagnoses:  None    ED Discharge Orders    None       Quintella Reichert, MD 12/25/17 1538

## 2017-12-25 NOTE — ED Notes (Signed)
Pt transported by [facility] at this time.

## 2017-12-25 NOTE — H&P (Signed)
History and Physical    Shaun Yoder XBD:[id number] DOB: Dec 28, 1945 DOA: 12/25/2017  PCP: Colon Branch, MD  Patient coming from: Home.  Chief Complaint: Fever and confusion.  HPI: Shaun Yoder is a 72 y.o. male with history of rheumatoid arthritis, severe aortic stenosis, thrombocytopenia, DVT on apixaban presents to the ER because of increasing confusion and fever.  Patient had come to the ER 1 days ago after patient had a fall from his wheelchair.  At this time as per the wife, who provided the history patient did not hit his head or lose consciousness.  Since his fall he has been having left shoulder and left groin pain.  He had come to the ER and at the time was mildly febrile and was noticed to have a right lower extremity cellulitis.  X-rays of the left shoulder left elbow and chest x-ray were unremarkable and since patient did not want to be admitted was sent home.  Following reaching home patient became more febrile more confused and as per the wife fever of 103 F was recorded.  Patient was brought back to the ER.  ED Course: In the ER patient is mildly febrile.  Right lower extremity has features concerning for cellulitis.  Patient still complains of pain in the left shoulder.  Patient has chronic pain from avascular necrosis of the left shoulder.  Has minimal movement of the left shoulder due to pain.  CT head CT chest and abdomen pelvis were unremarkable.  Patient was started on empiric antibiotics for possible developing sepsis from cellulitis and admitted for further management.  On my exam patient is alert awake and oriented to his name and place.  Has at times looks confused.  Review of Systems: As per HPI, rest all negative.   Past Medical History:  Diagnosis Date  . Anemia   . Cancer ([place])    bladder  . Chronic venous insufficiency   . Colitis   . Colon polyps   . Depression   . Diverticulosis   . DVT of deep femoral vein ([place])   . Gastritis   . GERD  (gastroesophageal reflux disease)   . History of rectal polyps   . Hyperlipidemia   . Hypertension   . Iron deficiency   . Low back pain   . Lymphedema of both lower extremities   . OA (osteoarthritis)   . Osteonecrosis of shoulder region ([place])   . Schizoaffective disorder, bipolar type ([place])   . Sleep apnea   . Varicose vein     Past Surgical History:  Procedure Laterality Date  . BLADDER SURGERY    . TOTAL KNEE ARTHROPLASTY Bilateral   . VARICOSE VEIN SURGERY       reports that he quit smoking about 19 years ago. His smoking use included cigarettes. He has a 107.50 pack-year smoking history. He has never used smokeless tobacco. He reports that he drinks alcohol. He reports that he does not use drugs.  Allergies  Allergen Reactions  . Leflunomide Other (See Comments)    diarrhea  . Morphine And Related Nausea And Vomiting  . Plaquenil [Hydroxychloroquine Sulfate]     rash    Family History  Problem Relation Age of Onset  . Heart attack Mother   . Heart attack Father   . Stroke Father   . Rheumatologic disease Neg Hx   . Colon cancer Neg Hx   . Lung disease Neg Hx   . Prostate cancer Neg Hx  Prior to Admission medications   Medication Sig Start Date End Date Taking? Authorizing Provider  acetaminophen-codeine (TYLENOL #3) 300-30 MG tablet Take 1 tablet by mouth every 4 (four) hours as needed for moderate pain.   Yes [provider]  apixaban (ELIQUIS) 5 MG TABS tablet Take 2.5 mg by mouth 2 (two) times daily.   Yes [provider]  ARIPiprazole (ABILIFY) 30 MG tablet Take 30 mg by mouth at bedtime.     Yes [provider]  aspirin EC 81 MG tablet Take 81 mg by mouth daily.   Yes [provider]  augmented betamethasone dipropionate (DIPROLENE-AF) 0.05 % cream Apply topically 2 (two) times daily. Apply to the rash of the right side of the face 11/03/17  Yes Paz, Alda Berthold, MD  calcium-vitamin D (CALCIUM 500/D) 500-200 MG-UNIT tablet  Take 1 tablet by mouth daily with breakfast.    Yes [provider]  clindamycin (CLEOCIN) 300 MG capsule Take 1 capsule (300 mg total) by mouth 3 (three) times daily. 12/24/17  Yes Plunkett, Loree Fee, MD  divalproex (DEPAKOTE ER) 500 MG 24 hr tablet 3 tabs at bedtime   Yes [provider]  divalproex (DEPAKOTE) 250 MG DR tablet Take 250 mg by mouth 3 (three) times daily.   Yes [provider]  folic acid (FOLVITE) 1 MG tablet Take 1 mg by mouth daily.     Yes [provider]  gabapentin (NEURONTIN) 300 MG capsule Take 300 mg by mouth daily. 11/19/17  Yes [provider]  ketoconazole (NIZORAL) 2 % shampoo Lather and massage into scalp 2-3 times per week. Leave for 5 minutes before rinsing. 07/07/17  Yes Paz, Alda Berthold, MD  methotrexate (RHEUMATREX) 2.5 MG tablet Take 17.5 mg by mouth once a week. Reported on 12/06/2015   Yes [name], MD  mirabegron ER (MYRBETRIQ) 50 MG TB24 tablet Take 50 mg by mouth daily.   Yes [provider]  Omega-3 1000 MG CAPS Take 1 capsule by mouth daily.    Yes [provider]  omeprazole (PRILOSEC) 20 MG capsule Take 20 mg by mouth every morning.    Yes [provider]  oxybutynin (DITROPAN) 5 MG tablet Take 5 mg by mouth 2 (two) times daily.   Yes [provider]  OXYGEN Inhale into the lungs. To use if O2 drops below 85 %   Yes [provider]  predniSONE (DELTASONE) 5 MG tablet Take 5 mg by mouth daily with breakfast.   Yes [provider]  Tofacitinib Citrate (XELJANZ XR) 11 MG TB24 Take 1 tablet by mouth daily.   Yes [provider]  traMADol (ULTRAM) 50 MG tablet Take 1 tablet (50 mg total) by mouth every 6 (six) hours as needed. 11/17/17  Yes Paz, Alda Berthold, MD  vitamin C (ASCORBIC ACID) 500 MG tablet Take 500 mg by mouth daily.   Yes [provider]  ziprasidone (GEODON) 80 MG capsule Take 80 mg by mouth daily.   Yes [provider]    ciprofloxacin (CILOXAN) 0.3 % ophthalmic solution Place 1 drop into both eyes every 4 (four) hours while awake. Patient not taking: Reported on 12/25/2017 10/22/17   Colon Branch, MD  furosemide (LASIX) 20 MG tablet Take 1 tablet (20 mg total) by mouth daily. Patient not taking: Reported on 12/25/2017 10/17/17   Colon Branch, MD  hydrocortisone cream 1 % Apply on affected areas on face and behind ears twice daily for 14 days. Patient  not taking: Reported on 12/25/2017 01/01/17   [name], DO  nystatin (MYCOSTATIN) 100000 UNIT/ML suspension Take 5 mLs (500,000 Units total) by mouth 4 (four) times daily. Patient not taking: Reported on 12/25/2017 11/03/17   Colon Branch, MD    Physical Exam: Vitals:   12/25/17 1730 12/25/17 1830 12/25/17 1956 12/25/17 2100  BP: 110/86 107/61  (!) 109/52  Pulse: (!) 55 60  (!) 53  Resp: 20 20  18   Temp:   97.9 F (36.6 C)   TempSrc:   Oral   SpO2: 98% 94%  92%  Weight:      Height:          Constitutional: Moderately built and nourished. Vitals:   12/25/17 1730 12/25/17 1830 12/25/17 1956 12/25/17 2100  BP: 110/86 107/61  (!) 109/52  Pulse: (!) 55 60  (!) 53  Resp: 20 20  18   Temp:   97.9 F (36.6 C)   TempSrc:   Oral   SpO2: 98% 94%  92%  Weight:      Height:       Eyes: Anicteric no pallor. ENMT: No discharge from the ears eyes nose or mouth. Neck: No mass felt.  No neck rigidity.  No JVD appreciated. Respiratory: No rhonchi or crepitations. Cardiovascular: S1-S2 heard no murmurs appreciated. Abdomen: Soft nontender bowel sounds present. Musculoskeletal: Swelling and redness of the right lower extremity.  Extends from the foot up to the mid calf.  Has restricted movement of the left upper extremity due to pain. Skin: Swelling and redness of the right foot extending from the foot to the mid calf.  No active discharge. Neurologic: Alert awake oriented to his name and place.  Moves all extremities. Psychiatric: Oriented to his name and  place.   Labs on Admission: I have personally reviewed following labs and imaging studies  CBC: Recent Labs  Lab 12/24/17 2031 12/25/17 1428  WBC 12.0* 10.7*  NEUTROABS 10.5* 9.0*  HGB 14.3 13.6  HCT 42.2 39.9  MCV 94.4 93.7  PLT 125* 785*   Basic Metabolic Panel: Recent Labs  Lab 12/24/17 2031 12/25/17 1428  NA 137 135  K 4.3 4.1  CL 100* 99*  CO2 30 27  GLUCOSE 111* 101*  BUN 25* 23*  CREATININE 0.70 0.74  CALCIUM 9.0 8.7*   GFR: Estimated Creatinine Clearance: 92.5 mL/min (by C-G formula based on SCr of 0.74 mg/dL). Liver Function Tests: Recent Labs  Lab 12/25/17 1428  AST 31  ALT 17  ALKPHOS 64  BILITOT 1.1  PROT 7.3  ALBUMIN 3.2*   No results for input(s): LIPASE, AMYLASE in the last 168 hours. No results for input(s): AMMONIA in the last 168 hours. Coagulation Profile: No results for input(s): INR, PROTIME in the last 168 hours. Cardiac Enzymes: No results for input(s): CKTOTAL, CKMB, CKMBINDEX, TROPONINI in the last 168 hours. BNP (last 3 results) No results for input(s): PROBNP in the last 8760 hours. HbA1C: No results for input(s): HGBA1C in the last 72 hours. CBG: No results for input(s): GLUCAP in the last 168 hours. Lipid Profile: No results for input(s): CHOL, HDL, LDLCALC, TRIG, CHOLHDL, LDLDIRECT in the last 72 hours. Thyroid Function Tests: No results for input(s): TSH, T4TOTAL, FREET4, T3FREE, THYROIDAB in the last 72 hours. Anemia Panel: No results for input(s): VITAMINB12, FOLATE, FERRITIN, TIBC, IRON, RETICCTPCT in the last 72 hours. Urine analysis:    Component Value Date/Time   COLORURINE AMBER (A) 12/25/2017 [place] 12/25/2017  1428   LABSPEC 1.020 12/25/2017 1428   PHURINE 6.5 12/25/2017 1428   GLUCOSEU NEGATIVE 12/25/2017 1428   GLUCOSEU NEGATIVE 04/03/2016 1453   HGBUR TRACE (A) 12/25/2017 1428   BILIRUBINUR NEGATIVE 12/25/2017 1428   BILIRUBINUR neg 04/03/2016 1549   KETONESUR 15 (A) 12/25/2017 1428     PROTEINUR NEGATIVE 12/25/2017 1428   UROBILINOGEN 1.0 04/03/2016 1549   UROBILINOGEN 2.0 (A) 04/03/2016 1453   NITRITE NEGATIVE 12/25/2017 1428   LEUKOCYTESUR NEGATIVE 12/25/2017 1428   Sepsis Labs: @LABRCNTIP (procalcitonin:4,lacticidven:4) ) Recent Results (from the past 240 hour(s))  Culture, blood (Routine X 2) w Reflex to ID Panel     Status: None (Preliminary result)   Collection Time: 12/24/17  9:30 PM  Result Value Ref Range Status   Specimen Description   Final    BLOOD RIGHT HAND Performed at [facility], [address] [place], [place] [address]    Special Requests   Final    BOTTLES DRAWN AEROBIC ONLY Blood Culture results may not be optimal due to an inadequate volume of blood received in culture bottles Performed at [facility], [address] [place], [place] [address]    Culture  Setup Time   Final    GRAM POSITIVE COCCI AEROBIC BOTTLE ONLY Performed at [facility], [place] [address] [place], [place] [address]    Culture PENDING  Incomplete   Report Status PENDING  Incomplete  Culture, blood (Routine X 2) w Reflex to ID Panel     Status: None (Preliminary result)   Collection Time: 12/24/17 10:15 PM  Result Value Ref Range Status   Specimen Description   Final    BLOOD RIGHT ANTECUBITAL Performed at [facility], [address] [place],  [address]    Special Requests   Final    BOTTLES DRAWN AEROBIC AND ANAEROBIC Blood Culture adequate volume Performed at [facility], [address] [place], [place] [address]    Culture  Setup Time   Final    GRAM POSITIVE COCCI IN BOTH AEROBIC AND ANAEROBIC BOTTLES Organism ID to follow Performed at [facility], [place] [address] [place],  [address]    Culture PENDING  Incomplete   Report Status PENDING  Incomplete     Radiological Exams on Admission: Dg Chest 1 View  Result Date: 12/24/2017 CLINICAL DATA:  Recent fall with chest pain,  initial encounter EXAM: CHEST  1 VIEW COMPARISON:  09/30/2017 FINDINGS: Cardiac shadow is stable. Bibasilar scarring is noted. Chronic changes about the shoulder joints are seen. No acute bony abnormality is noted. IMPRESSION: Chronic changes without acute abnormality. Electronically Signed   By: Inez Catalina M.D.   On: 12/24/2017 20:39   Dg Elbow Complete Left  Result Date: 12/24/2017 CLINICAL DATA:  Recent blunt trauma to elbow with pain, initial encounter EXAM: LEFT ELBOW - COMPLETE 3+ VIEW COMPARISON:  07/05/2010 FINDINGS: Mild degenerative changes of the elbow joint are noted. No acute fracture or dislocation is seen. No soft tissue abnormality is noted. IMPRESSION: Degenerative change without acute abnormality. Electronically Signed   By: Inez Catalina M.D.   On: 12/24/2017 20:39   Ct Head Wo Contrast  Result Date: 12/25/2017 CLINICAL DATA:  Fall, altered level of consciousness EXAM: CT HEAD WITHOUT CONTRAST TECHNIQUE: Contiguous axial images were obtained from the base of the skull through the vertex without intravenous contrast. COMPARISON:  CT head dated 10/05/2017 FINDINGS: Brain: No evidence  of acute infarction, hemorrhage, hydrocephalus, extra-axial collection or mass lesion/mass effect. Subcortical white matter and periventricular small vessel ischemic changes. Mild cortical atrophy. Vascular: Intracranial atherosclerosis. Skull: Normal. Negative for fracture or focal lesion. Sinuses/Orbits: The visualized paranasal sinuses are essentially clear. The mastoid air cells are unopacified. Other: None. IMPRESSION: No evidence of acute intracranial abnormality. Atrophy with small vessel ischemic changes. Electronically Signed   By: Julian Hy M.D.   On: 12/25/2017 15:45   Ct Chest W Contrast  Result Date: 12/25/2017 CLINICAL DATA:  Fall, altered level of consciousness EXAM: CT CHEST, ABDOMEN, AND PELVIS WITH CONTRAST TECHNIQUE: Multidetector CT imaging of the chest, abdomen and pelvis was  performed following the standard protocol during bolus administration of intravenous contrast. CONTRAST:  140mL ISOVUE-300 IOPAMIDOL (ISOVUE-300) INJECTION 61% COMPARISON:  CT abdomen/pelvis dated 08/21/2016 FINDINGS: CT CHEST FINDINGS Cardiovascular: No evidence of traumatic aortic injury. Atherosclerotic calcifications the aortic root and arch. The heart is normal in size.  No pericardial effusion. Coronary atherosclerosis of the LAD. Mediastinum/Nodes: No suspicious mediastinal lymphadenopathy. Visualized thyroid is unremarkable. Lungs/Pleura: Mild scarring/atelectasis in the right middle lobe, lingula, and bilateral lower lobes, chronic. Associated left lower lobe bronchiectasis. No focal consolidation. No suspicious pulmonary nodules. No pleural effusion or pneumothorax. Musculoskeletal: Degenerative changes of the thoracic spine. Severe degenerative changes of the bilateral shoulders. No fracture is seen. CT ABDOMEN PELVIS FINDINGS Hepatobiliary: Liver is within normal limits. Gallbladder is unremarkable. No intrahepatic or extrahepatic ductal dilatation. Pancreas: Within normal limits. Spleen: Within normal limits. Adrenals/Urinary Tract: Adrenal glands are within normal limits. 13 mm interpolar left renal cyst. Right kidney is within normal limits. No hydronephrosis. Bladder is within normal limits. Stomach/Bowel: Stomach is within normal limits. No evidence of bowel obstruction. Normal appendix (series 2/image 37). Vascular/Lymphatic: No evidence of abdominal aortic aneurysm. Atherosclerotic calcifications of the abdominal aorta and branch vessels. No suspicious abdominopelvic lymphadenopathy. Reproductive: Prostate is unremarkable. Other: No abdominopelvic ascites. No hemoperitoneum or free air. Musculoskeletal: Degenerative changes of the lumbar spine. Moderate degenerative changes of the left hip, chronic. No fracture is seen. IMPRESSION: No evidence of traumatic injury to the chest, abdomen, or pelvis.  Severe degenerative changes of the bilateral shoulders. Moderate degenerative changes of the left hip, chronic. Additional ancillary findings as above. Electronically Signed   By: Julian Hy M.D.   On: 12/25/2017 15:58   Ct Abdomen Pelvis W Contrast  Result Date: 12/25/2017 CLINICAL DATA:  Fall, altered level of consciousness EXAM: CT CHEST, ABDOMEN, AND PELVIS WITH CONTRAST TECHNIQUE: Multidetector CT imaging of the chest, abdomen and pelvis was performed following the standard protocol during bolus administration of intravenous contrast. CONTRAST:  147mL ISOVUE-300 IOPAMIDOL (ISOVUE-300) INJECTION 61% COMPARISON:  CT abdomen/pelvis dated 08/21/2016 FINDINGS: CT CHEST FINDINGS Cardiovascular: No evidence of traumatic aortic injury. Atherosclerotic calcifications the aortic root and arch. The heart is normal in size.  No pericardial effusion. Coronary atherosclerosis of the LAD. Mediastinum/Nodes: No suspicious mediastinal lymphadenopathy. Visualized thyroid is unremarkable. Lungs/Pleura: Mild scarring/atelectasis in the right middle lobe, lingula, and bilateral lower lobes, chronic. Associated left lower lobe bronchiectasis. No focal consolidation. No suspicious pulmonary nodules. No pleural effusion or pneumothorax. Musculoskeletal: Degenerative changes of the thoracic spine. Severe degenerative changes of the bilateral shoulders. No fracture is seen. CT ABDOMEN PELVIS FINDINGS Hepatobiliary: Liver is within normal limits. Gallbladder is unremarkable. No intrahepatic or extrahepatic ductal dilatation. Pancreas: Within normal limits. Spleen: Within normal limits. Adrenals/Urinary Tract: Adrenal glands are within normal limits. 13 mm interpolar left renal cyst. Right kidney is within normal  limits. No hydronephrosis. Bladder is within normal limits. Stomach/Bowel: Stomach is within normal limits. No evidence of bowel obstruction. Normal appendix (series 2/image 37). Vascular/Lymphatic: No evidence of  abdominal aortic aneurysm. Atherosclerotic calcifications of the abdominal aorta and branch vessels. No suspicious abdominopelvic lymphadenopathy. Reproductive: Prostate is unremarkable. Other: No abdominopelvic ascites. No hemoperitoneum or free air. Musculoskeletal: Degenerative changes of the lumbar spine. Moderate degenerative changes of the left hip, chronic. No fracture is seen. IMPRESSION: No evidence of traumatic injury to the chest, abdomen, or pelvis. Severe degenerative changes of the bilateral shoulders. Moderate degenerative changes of the left hip, chronic. Additional ancillary findings as above. Electronically Signed   By: Julian Hy M.D.   On: 12/25/2017 15:58   Dg Shoulder Left  Result Date: 12/24/2017 CLINICAL DATA:  Left shoulder injury.  Hit door frame. EXAM: LEFT SHOULDER - 2+ VIEW COMPARISON:  None. FINDINGS: Severe deformity of the left shoulder with flattening of the humeral head and advanced degenerative changes. This is stable since prior chest x-ray. No acute fracture, subluxation or dislocation. IMPRESSION: Severe chronic deformity of the left glenohumeral joint as above with advanced degenerative changes. No acute bony abnormality. Electronically Signed   By: Rolm Baptise M.D.   On: 12/24/2017 20:37    EKG: Independently reviewed.  Sinus rhythm with RBBB.  Assessment/Plan Principal Problem:   Sepsis ([place]) Active Problems:   Aortic stenosis, severe   Schizoaffective disorder, bipolar type ([facility])   Rheumatoid arthritis ([place])   Left leg DVT ([facility])   Cellulitis of right leg    1. Possible developing sepsis secondary to cellulitis of the right lower extremity -patient has been empirically placed on vancomycin and Zosyn.  Follow cultures.  Patient has been admitted recently for left lower activity cellulitis at the time patient had Pseudomonas bacteremia. 2. Acute encephalopathy likely from developing sepsis.  On my exam patient is oriented to his name and place.  Will  check ammonia levels and Depakote level since patient is on Depakote.  Note that patient also received Percocet at home for his pain when he was discharged yesterday.  This could also be contributing. 3. History of DVT of the left lower extremity -until patient can relabel to take orally we will keep patient on Lovenox.  Once patient is more alert awake and can take medications only change to apixaban. 4. Thrombocytopenia it appears to be chronic -follow CBC. 5. History of rheumatoid arthritis -we will hold immunosuppressants due to acute infectious process.  Will keep patient on stress dose steroids for now. 6. Aortic stenosis being followed by cardiologist. 7. History of schizoaffective disorder and bipolar -on Abilify Geodon and Depakote.   DVT prophylaxis: Lovenox. Code Status: DNR as confirmed by patient's wife. Family Communication: Patient's wife. Disposition Plan: Home. Consults called: None. Admission status: Inpatient.   Rise Patience MD [facility] Pager [phone].  If 7PM-7AM, please contact night-coverage [email or web address] Password [email or web address]  12/25/2017, 10:06 PM

## 2017-12-25 NOTE — ED Provider Notes (Signed)
I received this patient in signout from Dr. Ralene Bathe.  Presented with fever and altered mental status, code sepsis initiated, we were awaiting imaging of head, chest, abdomen, and pelvis.  CT should show no acute findings and no obvious source of infection.  His blood pressure has been remained stable with systolic 84K-103, MAPs ~12.  Is on 2 L supplemental oxygen.  His right leg is erythematous, both legs have chronic venous stasis changes but I am concerned about cellulitis as possible source given asymmetric appearance peer to left.  We have gently fluid resuscitated because of his history of aortic stenosis.  Discussed admission with [facility] hospitalist, Dr. Maudie Mercury, appreciate assistance.  Patient admitted to [facility] for further treatment.   Julane Crock, Wenda Overland, MD 12/25/17 [phone]

## 2017-12-25 NOTE — Progress Notes (Signed)
Pharmacy Antibiotic Note  Shaun Yoder is a 72 y.o. male admitted on 12/25/2017 with sepsis. Febrile with concern for cellulitis of right foot. Pharmacy has been consulted for Zosyn and vancomycin dosing.  Plan: - Zosyn 3.375 G 30 min infusion x 1 - Zosyn 3.375 G every 8 hours  - vancomycin 2 G load x 1 - vancomycin 1 G every 8 hours - goal trough 15-20 mcg/mL  - monitor clinical progression, length of therapy, renal function, and vancomycin trough as needed   Height: 5\' 7"  (170.2 cm) Weight: 207 lb (93.9 kg) IBW/kg (Calculated) : 66.1  Temp (24hrs), Avg:101.4 F (38.6 C), Min:100.4 F (38 C), Max:102.4 F (39.1 C)  Recent Labs  Lab 12/24/17 2031 12/25/17 1430  WBC 12.0*  --   CREATININE 0.70  --   LATICACIDVEN  --  0.81    Estimated Creatinine Clearance: 92.5 mL/min (by C-G formula based on SCr of 0.7 mg/dL).    Allergies  Allergen Reactions  . Leflunomide Other (See Comments)    diarrhea  . Morphine And Related Nausea And Vomiting  . Plaquenil [Hydroxychloroquine Sulfate]     rash    Antimicrobials this admission: Zosyn 6/6 >>  Vancomycin 6/6  >>   Dose adjustments this admission: N/A  Microbiology results: 6/6 BCx: pending  Thank you for allowing pharmacy to be a part of this patient's care.  Deboraha Sprang, PharmD 12/25/2017 2:55 PM

## 2017-12-25 NOTE — Progress Notes (Signed)
ANTICOAGULATION CONSULT NOTE - Initial Consult  Pharmacy Consult for Enoxaparin Indication: VTE treatment  Allergies  Allergen Reactions  . Leflunomide Other (See Comments)    diarrhea  . Morphine And Related Nausea And Vomiting  . Plaquenil [Hydroxychloroquine Sulfate]     rash    Patient Measurements: Height: 5\' 7"  (170.2 cm) Weight: 207 lb (93.9 kg) IBW/kg (Calculated) : 66.1 Heparin Dosing Weight:   Vital Signs: Temp: 97.9 F (36.6 C) (06/06 1956) Temp Source: Oral (06/06 1956) BP: 109/52 (06/06 2100) Pulse Rate: 53 (06/06 2100)  Labs: Recent Labs    12/24/17 2031 12/25/17 1428  HGB 14.3 13.6  HCT 42.2 39.9  PLT 125* 108*  CREATININE 0.70 0.74    Estimated Creatinine Clearance: 92.5 mL/min (by C-G formula based on SCr of 0.74 mg/dL).   Medical History: Past Medical History:  Diagnosis Date  . Anemia   . Cancer ([place])    bladder  . Chronic venous insufficiency   . Colitis   . Colon polyps   . Depression   . Diverticulosis   . DVT of deep femoral vein ([place])   . Gastritis   . GERD (gastroesophageal reflux disease)   . History of rectal polyps   . Hyperlipidemia   . Hypertension   . Iron deficiency   . Low back pain   . Lymphedema of both lower extremities   . OA (osteoarthritis)   . 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 Schizoaffective disorder, bipolar type ([place])   . Sleep apnea   . Varicose vein     Medications:  Medications Prior to Admission  Medication Sig Dispense Refill Last Dose  . acetaminophen-codeine (TYLENOL #3) 300-30 MG tablet Take 1 tablet by mouth every 4 (four) hours as needed for moderate pain.   Past Month at Unknown time  . apixaban (ELIQUIS) 5 MG TABS tablet Take 2.5 mg by mouth 2 (two) times daily.   12/24/2017 at 600 pm  . ARIPiprazole (ABILIFY) 30 MG tablet Take 30 mg by mouth at bedtime.     12/25/2017 at Unknown time  . aspirin EC 81 MG tablet Take 81 mg by mouth daily.   12/24/2017 at Unknown time  . augmented  betamethasone dipropionate (DIPROLENE-AF) 0.05 % cream Apply topically 2 (two) times daily. Apply to the rash of the right side of the face 60 g 0 Past Week at Unknown time  . calcium-vitamin D (CALCIUM 500/D) 500-200 MG-UNIT tablet Take 1 tablet by mouth daily with breakfast.    12/24/2017 at Unknown time  . clindamycin (CLEOCIN) 300 MG capsule Take 1 capsule (300 mg total) by mouth 3 (three) times daily. 21 capsule 0 12/24/2017 at Unknown time  . divalproex (DEPAKOTE ER) 500 MG 24 hr tablet 3 tabs at bedtime   12/24/2017 at Unknown time  . divalproex (DEPAKOTE) 250 MG DR tablet Take 250 mg by mouth 3 (three) times daily.   12/24/2017 at Unknown time  . folic acid (FOLVITE) 1 MG tablet Take 1 mg by mouth daily.     12/24/2017 at Unknown time  . gabapentin (NEURONTIN) 300 MG capsule Take 300 mg by mouth daily.   12/24/2017 at Unknown time  . ketoconazole (NIZORAL) 2 % shampoo Lather and massage into scalp 2-3 times per week. Leave for 5 minutes before rinsing. 120 mL 0 Past Week at Unknown time  . methotrexate (RHEUMATREX) 2.5 MG tablet Take 17.5 mg by mouth once a week. Reported on 12/06/2015   Past Week at Unknown  time  . mirabegron ER (MYRBETRIQ) 50 MG TB24 tablet Take 50 mg by mouth daily.   12/24/2017 at Unknown time  . Omega-3 1000 MG CAPS Take 1 capsule by mouth daily.    12/24/2017 at Unknown time  . omeprazole (PRILOSEC) 20 MG capsule Take 20 mg by mouth every morning.    12/24/2017 at Unknown time  . oxybutynin (DITROPAN) 5 MG tablet Take 5 mg by mouth 2 (two) times daily.   12/24/2017 at Unknown time  . OXYGEN Inhale into the lungs. To use if O2 drops below 85 %   12/25/2017 at Unknown time  . predniSONE (DELTASONE) 5 MG tablet Take 5 mg by mouth daily with breakfast.   12/24/2017 at Unknown time  . Tofacitinib Citrate (XELJANZ XR) 11 MG TB24 Take 1 tablet by mouth daily.   12/24/2017 at Unknown time  . traMADol (ULTRAM) 50 MG tablet Take 1 tablet (50 mg total) by mouth every 6 (six) hours as needed. 30 tablet 0  12/24/2017 at Unknown time  . vitamin C (ASCORBIC ACID) 500 MG tablet Take 500 mg by mouth daily.   12/24/2017 at Unknown time  . ziprasidone (GEODON) 80 MG capsule Take 80 mg by mouth daily.   12/25/2017 at Unknown time  . ciprofloxacin (CILOXAN) 0.3 % ophthalmic solution Place 1 drop into both eyes every 4 (four) hours while awake. (Patient not taking: Reported on 12/25/2017) 5 mL 0 Not Taking at Unknown time  . furosemide (LASIX) 20 MG tablet Take 1 tablet (20 mg total) by mouth daily. (Patient not taking: Reported on 12/25/2017) 90 tablet 0 Not Taking at Unknown time  . hydrocortisone cream 1 % Apply on affected areas on face and behind ears twice daily for 14 days. (Patient not taking: Reported on 12/25/2017) 30 g 1 Not Taking at Unknown time  . nystatin (MYCOSTATIN) 100000 UNIT/ML suspension Take 5 mLs (500,000 Units total) by mouth 4 (four) times daily. (Patient not taking: Reported on 12/25/2017) 200 mL 0 Not Taking at Unknown time   Scheduled:  . ARIPiprazole  30 mg Oral QHS  . [START ON 12/26/2017] aspirin EC  81 mg Oral Daily  . divalproex  1,500 mg Oral BID  . enoxaparin (LOVENOX) injection  95 mg Subcutaneous BID  . [START ON 12/26/2017] gabapentin  300 mg Oral Daily  . hydrocortisone sod succinate (SOLU-CORTEF) inj  50 mg Intravenous Q8H  . vancomycin      . [START ON 12/26/2017] ziprasidone  80 mg Oral Daily    Assessment: Patient on apixaban prior to admission and MD wants pharmacy to dose enoxaparin.  Last dose apixaban 2.5mg  at 1800 6/5.    Goal of Therapy:  Anti-Xa level 0.6-1 units/ml 4hrs after LMWH dose given Monitor platelets by anticoagulation protocol: Yes   Plan:  Enoxaparin 95mg  sq q12hr   Tyler Deis, Shea Stakes [place] 12/25/2017,10:23 PM

## 2017-12-26 ENCOUNTER — Inpatient Hospital Stay (HOSPITAL_COMMUNITY): Payer: Medicare Other

## 2017-12-26 DIAGNOSIS — B9561 Methicillin susceptible Staphylococcus aureus infection as the cause of diseases classified elsewhere: Secondary | ICD-10-CM | POA: Diagnosis present

## 2017-12-26 DIAGNOSIS — R7881 Bacteremia: Secondary | ICD-10-CM

## 2017-12-26 DIAGNOSIS — M1990 Unspecified osteoarthritis, unspecified site: Secondary | ICD-10-CM

## 2017-12-26 DIAGNOSIS — R011 Cardiac murmur, unspecified: Secondary | ICD-10-CM

## 2017-12-26 DIAGNOSIS — Z8249 Family history of ischemic heart disease and other diseases of the circulatory system: Secondary | ICD-10-CM

## 2017-12-26 DIAGNOSIS — Z87891 Personal history of nicotine dependence: Secondary | ICD-10-CM

## 2017-12-26 DIAGNOSIS — I5030 Unspecified diastolic (congestive) heart failure: Secondary | ICD-10-CM

## 2017-12-26 DIAGNOSIS — Z888 Allergy status to other drugs, medicaments and biological substances status: Secondary | ICD-10-CM

## 2017-12-26 DIAGNOSIS — I350 Nonrheumatic aortic (valve) stenosis: Secondary | ICD-10-CM

## 2017-12-26 DIAGNOSIS — Z823 Family history of stroke: Secondary | ICD-10-CM

## 2017-12-26 DIAGNOSIS — B9562 Methicillin resistant Staphylococcus aureus infection as the cause of diseases classified elsewhere: Secondary | ICD-10-CM

## 2017-12-26 LAB — COMPREHENSIVE METABOLIC PANEL
ALK PHOS: 59 U/L (ref 38–126)
ALT: 26 U/L (ref 17–63)
AST: 37 U/L (ref 15–41)
Albumin: 2.9 g/dL — ABNORMAL LOW (ref 3.5–5.0)
Anion gap: 10 (ref 5–15)
BILIRUBIN TOTAL: 1 mg/dL (ref 0.3–1.2)
BUN: 20 mg/dL (ref 6–20)
CALCIUM: 8.4 mg/dL — AB (ref 8.9–10.3)
CO2: 27 mmol/L (ref 22–32)
CREATININE: 0.59 mg/dL — AB (ref 0.61–1.24)
Chloride: 101 mmol/L (ref 101–111)
GFR calc Af Amer: >60 mL/min (ref >60)
GFR calc non Af Amer: >60 mL/min (ref >60)
GLUCOSE: 108 mg/dL — AB (ref 65–99)
Potassium: 4 mmol/L (ref 3.5–5.1)
Sodium: 138 mmol/L (ref 135–145)
TOTAL PROTEIN: 6.8 g/dL (ref 6.5–8.1)

## 2017-12-26 LAB — CBC WITH DIFFERENTIAL/PLATELET
BASOS PCT: 0 %
Basophils Absolute: 0 10*3/uL (ref 0.0–0.1)
Eosinophils Absolute: 0 10*3/uL (ref 0.0–0.7)
Eosinophils Relative: 0 %
HEMATOCRIT: 40.2 % (ref 39.0–52.0)
HEMOGLOBIN: 13 g/dL (ref 13.0–17.0)
LYMPHS ABS: 0.4 10*3/uL — AB (ref 0.7–4.0)
Lymphocytes Relative: 4 %
MCH: 30.7 pg (ref 26.0–34.0)
MCHC: 32.3 g/dL (ref 30.0–36.0)
MCV: 95 fL (ref 78.0–100.0)
MONOS PCT: 8 %
Monocytes Absolute: 0.8 10*3/uL (ref 0.1–1.0)
NEUTROS ABS: 9 10*3/uL — AB (ref 1.7–7.7)
NEUTROS PCT: 88 %
Platelets: 107 10*3/uL — ABNORMAL LOW (ref 150–400)
RBC: 4.23 MIL/uL (ref 4.22–5.81)
RDW: 16.3 % — ABNORMAL HIGH (ref 11.5–15.5)
WBC: 10.2 10*3/uL (ref 4.0–10.5)

## 2017-12-26 LAB — GLUCOSE, CAPILLARY
GLUCOSE-CAPILLARY: 111 mg/dL — AB (ref 65–99)
Glucose-Capillary: 111 mg/dL — ABNORMAL HIGH (ref 65–99)
Glucose-Capillary: 123 mg/dL — ABNORMAL HIGH (ref 65–99)
Glucose-Capillary: 124 mg/dL — ABNORMAL HIGH (ref 65–99)
Glucose-Capillary: 155 mg/dL — ABNORMAL HIGH (ref 65–99)

## 2017-12-26 LAB — PROCALCITONIN: Procalcitonin: <0.1 ng/mL

## 2017-12-26 LAB — ECHOCARDIOGRAM COMPLETE
HEIGHTINCHES: 67 in
WEIGHTICAEL: 3312 [oz_av]

## 2017-12-26 LAB — VALPROIC ACID LEVEL: Valproic Acid Lvl: 57 ug/mL (ref 50.0–100.0)

## 2017-12-26 LAB — AMMONIA: Ammonia: 45 umol/L — ABNORMAL HIGH (ref 9–35)

## 2017-12-26 LAB — MRSA PCR SCREENING: MRSA BY PCR: POSITIVE — AB

## 2017-12-26 LAB — LACTIC ACID, PLASMA: Lactic Acid, Venous: 0.9 mmol/L (ref 0.5–1.9)

## 2017-12-26 MED ORDER — ZIPRASIDONE HCL 80 MG PO CAPS
80.0000 mg | ORAL_CAPSULE | Freq: Every day | ORAL | Status: DC
  Administered 2017-12-26 – 2017-12-30 (×5): 80 mg via ORAL
  Filled 2017-12-26 (×5): qty 1

## 2017-12-26 MED ORDER — CEFAZOLIN SODIUM-DEXTROSE 2-4 GM/100ML-% IV SOLN
2.0000 g | Freq: Three times a day (TID) | INTRAVENOUS | Status: DC
  Administered 2017-12-26 – 2017-12-31 (×16): 2 g via INTRAVENOUS
  Filled 2017-12-26 (×19): qty 100

## 2017-12-26 MED ORDER — CHLORHEXIDINE GLUCONATE CLOTH 2 % EX PADS
6.0000 | MEDICATED_PAD | Freq: Every day | CUTANEOUS | Status: AC
  Administered 2017-12-26 – 2017-12-30 (×4): 6 via TOPICAL

## 2017-12-26 MED ORDER — DIVALPROEX SODIUM ER 500 MG PO TB24
1500.0000 mg | ORAL_TABLET | Freq: Every day | ORAL | Status: DC
  Administered 2017-12-26 – 2017-12-30 (×5): 1500 mg via ORAL
  Filled 2017-12-26 (×4): qty 3

## 2017-12-26 MED ORDER — MUPIROCIN 2 % EX OINT
1.0000 "application " | TOPICAL_OINTMENT | Freq: Two times a day (BID) | CUTANEOUS | Status: AC
  Administered 2017-12-26 – 2017-12-30 (×10): 1 via NASAL
  Filled 2017-12-26 (×4): qty 22

## 2017-12-26 MED ORDER — DIVALPROEX SODIUM 250 MG PO DR TAB
250.0000 mg | DELAYED_RELEASE_TABLET | Freq: Every day | ORAL | Status: DC
  Administered 2017-12-26 – 2017-12-31 (×6): 250 mg via ORAL
  Filled 2017-12-26 (×6): qty 1

## 2017-12-26 NOTE — Progress Notes (Signed)
Pharmacy Antibiotic Note  Shaun Yoder is a 72 y.o. male admitted on 12/25/2017 with sepsis. Febrile with concern for cellulitis of right foot. Pharmacy has been consulted for Zosyn and vancomycin dosing.  Blood cx reveal GPC with BCID = MSSA.  Orders to adjust to cefazolin per pharmacy  Today, 12/26/2017  Renal: SCr WNL  WBC WNL  Fever resolved  Plan:  Cefazolin 2gm IV q8h  Pharmacy will follow peripherally as do not anticipate need to renally adjust   Height: 5\' 7"  (170.2 cm) Weight: 207 lb (93.9 kg) IBW/kg (Calculated) : 66.1  Temp (24hrs), Avg:98.7 F (37.1 C), Min:97.7 F (36.5 C), Max:102.4 F (39.1 C)  Recent Labs  Lab 12/24/17 2031 12/25/17 1428 12/25/17 1430 12/26/17 0324  WBC 12.0* 10.7*  --  10.2  CREATININE 0.70 0.74  --  0.59*  LATICACIDVEN  --   --  0.81 0.9    Estimated Creatinine Clearance: 92.5 mL/min (A) (by C-G formula based on SCr of 0.59 mg/dL (L)).    Allergies  Allergen Reactions  . Leflunomide Other (See Comments)    diarrhea  . Morphine And Related Nausea And Vomiting  . Plaquenil [Hydroxychloroquine Sulfate]     rash    Antimicrobials this admission: Zosyn 6/6 >> 6/7 Vancomycin 6/6  >> 6/7 Cefazolin 6/7 >>  Dose adjustments this admission: N/A  Microbiology results: 6/6 BCx: GPC (BCID = MSSA) 6/7 BCx:   Thank you for allowing pharmacy to be a part of this patient's care.  Doreene Eland, PharmD, BCPS.   Pager: [phone] 12/26/2017 9:54 AM

## 2017-12-26 NOTE — Consult Note (Signed)
[facility] for Infectious Disease    Date of Admission:  12/25/2017    Total days of antibiotics 2        Day 1 cefazolin              Reason for Consult: Automatic consultation for methicillin sensitive staph aureus bacteremia    Assessment: He has right foot cellulitis complicated by staph aureus bacteremia.  Repeat blood cultures have been obtained.  I will order a transthoracic echocardiogram.  He is certainly at risk for endocarditis given his severe aortic stenosis and calcified mitral valve.  He is also at risk for septic arthritis although I do not see any evidence of active joint infection at this time.  Plan: 1. Continue cefazolin 2. Hold off on PICC placement until repeat blood cultures are negative 3. Transthoracic echocardiogram 4. I will have Dr. Lita Mains [phone]) monitor cultures and make any changes necessary this weekend  Principal Problem:   Bacteremia due to methicillin susceptible Staphylococcus aureus (MSSA) Active Problems:   Sepsis ([place])   Cellulitis of right leg   Aortic stenosis, severe   Schizoaffective disorder, bipolar type ([place])   Rheumatoid arthritis ([place])   Left leg DVT ([place])   Scheduled Meds: . ARIPiprazole  30 mg Oral QHS  . aspirin EC  81 mg Oral Daily  . Chlorhexidine Gluconate Cloth  6 each Topical Q0600  . divalproex  1,500 mg Oral QHS  . divalproex  250 mg Oral Q lunch  . enoxaparin (LOVENOX) injection  95 mg Subcutaneous BID  . gabapentin  300 mg Oral Daily  . hydrocortisone sod succinate (SOLU-CORTEF) inj  50 mg Intravenous Q8H  . mupirocin ointment  1 application Nasal BID  . ziprasidone  80 mg Oral QHS   Continuous Infusions: .  ceFAZolin (ANCEF) IV     PRN Meds:.acetaminophen **OR** acetaminophen, fentaNYL (SUBLIMAZE) injection, ondansetron **OR** ondansetron (ZOFRAN) IV  HPI: Shaun Yoder is a 72 y.o. male with multiple medical problems including severe aortic stenosis and severe arthritis who is  largely confined to an electric wheelchair when he is up during the day.  He says that he has a very narrow doorway into 1 of his rooms.  2 days ago he accidentally ran his wheelchair into the door frame striking his left shoulder and elbow.  His wife brought him to the emergency department.  X-rays revealed severe arthritis but no acute changes.  He was noted incidentally to have fever and was found to have right foot cellulitis.  He refused admission and was discharged with oral clindamycin but returned several hours later because of persistent fever, new confusion and agitation.  Blood cultures done on his first ED visit have grown MSSA in both sets.   Review of Systems: Review of Systems  Unable to perform ROS: Mental acuity    Past Medical History:  Diagnosis Date  . Anemia   . Cancer ([place])    bladder  . Chronic venous insufficiency   . Colitis   . Colon polyps   . Depression   . Diverticulosis   . DVT of deep femoral vein ([place])   . Gastritis   . GERD (gastroesophageal reflux disease)   . History of rectal polyps   . Hyperlipidemia   . Hypertension   . Iron deficiency   . Low back pain   . Lymphedema of both lower extremities   . OA (osteoarthritis)   . Osteonecrosis of  shoulder region [facility])   . Schizoaffective disorder, bipolar type ([place])   . Sleep apnea   . Varicose vein     Social History   Tobacco Use  . Smoking status: Former Smoker    Packs/day: 2.50    Years: 43.00    Pack years: 107.50    Types: Cigarettes    Last attempt to quit: 07/22/1998    Years since quitting: 19.4  . Smokeless tobacco: Never Used  Substance Use Topics  . Alcohol use: Yes    Alcohol/week: 0.0 oz    Comment: Occasional glass of wine  . Drug use: No    Family History  Problem Relation Age of Onset  . Heart attack Mother   . Heart attack Father   . Stroke Father   . Rheumatologic disease Neg Hx   . Colon cancer Neg Hx   . Lung disease Neg Hx   . Prostate cancer Neg Hx     Allergies  Allergen Reactions  . Leflunomide Other (See Comments)    diarrhea  . Morphine And Related Nausea And Vomiting  . Plaquenil [Hydroxychloroquine Sulfate]     rash    OBJECTIVE: Blood pressure 128/71, pulse 65, temperature 97.9 F (36.6 C), temperature source Oral, resp. rate 19, height 5\' 7"  (1.702 m), weight 207 lb (93.9 kg), SpO2 99 %.  Physical Exam  Constitutional:  He is alert, pleasant and talkative but has poor recall of recent events.  HENT:  Mouth/Throat: No oropharyngeal exudate.  Cardiovascular: Normal rate and regular rhythm.  Murmur heard. Harsh 2/6 systolic murmur.  Pulmonary/Chest: Effort normal and breath sounds normal.  Abdominal: Soft. He exhibits no distension. There is no tenderness.  Musculoskeletal:  He has pain with range of motion of multiple joints including his left shoulder and left elbow but there is no redness, swelling or warmth.  He has bilateral knee replacements.  Neurological: He is alert.  Skin:  He has bilateral venous stasis dermatitis of his lower extremities with several small scabbed areas.  He has erythema and slight warmth of his right foot.  He has thickened dystrophic toenails.    Lab Results Lab Results  Component Value Date   WBC 10.2 12/26/2017   HGB 13.0 12/26/2017   HCT 40.2 12/26/2017   MCV 95.0 12/26/2017   PLT 107 (L) 12/26/2017    Lab Results  Component Value Date   CREATININE 0.59 (L) 12/26/2017   BUN 20 12/26/2017   NA 138 12/26/2017   K 4.0 12/26/2017   CL 101 12/26/2017   CO2 27 12/26/2017    Lab Results  Component Value Date   ALT 26 12/26/2017   AST 37 12/26/2017   ALKPHOS 59 12/26/2017   BILITOT 1.0 12/26/2017     Microbiology: Recent Results (from the past 240 hour(s))  Culture, blood (Routine X 2) w Reflex to ID Panel     Status: Abnormal (Preliminary result)   Collection Time: 12/24/17  9:30 PM  Result Value Ref Range Status   Specimen Description   Final    BLOOD RIGHT  HAND Performed at [facility], [address] [place], [place] [address]    Special Requests   Final    BOTTLES DRAWN AEROBIC ONLY Blood Culture results may not be optimal due to an inadequate volume of blood received in culture bottles Performed at [facility], [address] [place], [place] [address]    Culture  Setup Time   Final  GRAM POSITIVE COCCI AEROBIC BOTTLE ONLY CRITICAL RESULT CALLED TO, READ BACK BY AND VERIFIED WITHLavell Luster [facility] 2206 12/25/17 A BROWNING Performed at [facility], [place] [address], [place], [place] [address]    Culture STAPHYLOCOCCUS AUREUS (A)  Final   Report Status PENDING  Incomplete  Culture, blood (Routine X 2) w Reflex to ID Panel     Status: Abnormal (Preliminary result)   Collection Time: 12/24/17 10:15 PM  Result Value Ref Range Status   Specimen Description   Final    BLOOD RIGHT ANTECUBITAL Performed at [facility], [address] [place], [place] [address]    Special Requests   Final    BOTTLES DRAWN AEROBIC AND ANAEROBIC Blood Culture adequate volume Performed at [facility], [address] [place], [place] [address]    Culture  Setup Time   Final    GRAM POSITIVE COCCI IN BOTH AEROBIC AND ANAEROBIC BOTTLES CRITICAL RESULT CALLED TO, READ BACK BY AND VERIFIED WITH: Lavell Luster [facility] 2206 12/25/17 A BROWNING    Culture (A)  Final    STAPHYLOCOCCUS AUREUS SUSCEPTIBILITIES TO FOLLOW Performed at [facility], [place] [address], [place], [place] [address]    Report Status PENDING  Incomplete  Blood Culture ID Panel (Reflexed)     Status: Abnormal   Collection Time: 12/24/17 10:15 PM  Result Value Ref Range Status   Enterococcus species NOT DETECTED NOT DETECTED Final   Listeria monocytogenes NOT DETECTED NOT DETECTED Final   Staphylococcus species DETECTED (A) NOT DETECTED Final    Comment: CRITICAL RESULT CALLED TO, READ BACK BY AND VERIFIED WITH: Lavell Luster [facility] 2206  12/25/17 A BROWNING    Staphylococcus aureus DETECTED (A) NOT DETECTED Final    Comment: Methicillin (oxacillin) susceptible Staphylococcus aureus (MSSA). Preferred therapy is anti staphylococcal beta lactam antibiotic (Cefazolin or Nafcillin), unless clinically contraindicated. CRITICAL RESULT CALLED TO, READ BACK BY AND VERIFIED WITH: Lavell Luster [facility] 2206 12/25/17 A BROWNING    Methicillin resistance NOT DETECTED NOT DETECTED Final   Streptococcus species NOT DETECTED NOT DETECTED Final   Streptococcus agalactiae NOT DETECTED NOT DETECTED Final   Streptococcus pneumoniae NOT DETECTED NOT DETECTED Final   Streptococcus pyogenes NOT DETECTED NOT DETECTED Final   Acinetobacter baumannii NOT DETECTED NOT DETECTED Final   Enterobacteriaceae species NOT DETECTED NOT DETECTED Final   Enterobacter cloacae complex NOT DETECTED NOT DETECTED Final   Escherichia coli NOT DETECTED NOT DETECTED Final   Klebsiella oxytoca NOT DETECTED NOT DETECTED Final   Klebsiella pneumoniae NOT DETECTED NOT DETECTED Final   Proteus species NOT DETECTED NOT DETECTED Final   Serratia marcescens NOT DETECTED NOT DETECTED Final   Haemophilus influenzae NOT DETECTED NOT DETECTED Final   Neisseria meningitidis NOT DETECTED NOT DETECTED Final   Pseudomonas aeruginosa NOT DETECTED NOT DETECTED Final   Candida albicans NOT DETECTED NOT DETECTED Final   Candida glabrata NOT DETECTED NOT DETECTED Final   Candida krusei NOT DETECTED NOT DETECTED Final   Candida parapsilosis NOT DETECTED NOT DETECTED Final   Candida tropicalis NOT DETECTED NOT DETECTED Final    Comment: Performed at [facility], [address], [place], [place] [address]  MRSA PCR Screening     Status: Abnormal   Collection Time: 12/25/17 11:31 PM  Result Value Ref Range Status   MRSA by PCR POSITIVE (A) NEGATIVE Final    Comment:        The GeneXpert MRSA Assay (  FDA approved for NASAL specimens only), is one component of a comprehensive MRSA  colonization surveillance program. It is not intended to diagnose MRSA infection nor to guide or monitor treatment for MRSA infections. RESULT CALLED TO, READ BACK BY AND VERIFIED WITH: A SAWYERS,RN @0554  12/26/17 MKELLY Performed at [facility], [place] [address] [place], [place] [address]     Michel Bickers, [facility] [phone] pager   [phone] cell 12/26/2017, 10:06 AM

## 2017-12-26 NOTE — Progress Notes (Signed)
PROGRESS NOTE    Shaun Yoder  FFM:[id number] DOB: Jul 12, 1946 DOA: 12/25/2017 PCP: Colon Branch, MD    Brief Narrative:  72 y.o. male with history of rheumatoid arthritis, severe aortic stenosis, thrombocytopenia, DVT on apixaban presents to the ER because of increasing confusion and fever.  Pt diagnosed with sepsis secondary to cellulitis. Dx with Staph Aureus bacteremia.    Assessment & Plan:   Principal Problem:   Bacteremia due to methicillin susceptible Staphylococcus aureus (MSSA)/  Sepsis (HCC)/Cellulitis of right leg - ID consulted and assisting with case. - Plan is for Cefazolin. Awaiting from repeat blood cultures to be negative before placing picc line. - TTE  Active Problems:   Aortic stenosis, severe - stable    Schizoaffective disorder, bipolar type (HCC) - stable    Rheumatoid arthritis ([place]) - stable    Left leg DVT ([place]) - Pt is on Lovenox  DVT prophylaxis: Lovenox Code Status: Full Family Communication: none at bedside.  Disposition Plan:    Consultants:   ID   Procedures: none   Antimicrobials: Ancef   Subjective: Pt has no new complaints no acute issues overnight.   Objective: Vitals:   12/26/17 0400 12/26/17 0500 12/26/17 0720 12/26/17 1115  BP: (!) 103/58 128/71    Pulse: (!) 49 65    Resp: (!) 21 19    Temp:   97.9 F (36.6 C) 97.6 F (36.4 C)  TempSrc:   Oral Oral  SpO2: 99% 99%    Weight:      Height:        Intake/Output Summary (Last 24 hours) at 12/26/2017 1313 Last data filed at 12/26/2017 0506 Gross per 24 hour  Intake 2050 ml  Output 680 ml  Net 1370 ml   Filed Weights   12/25/17 1421  Weight: 93.9 kg (207 lb)    Examination:  General exam: Appears calm and comfortable, in nad. Respiratory system: Clear to auscultation. Respiratory effort normal. Equal chest rise.  Cardiovascular system: S1 & S2 heard, RRR. No JVD, murmurs, rubs, gallops  Gastrointestinal system: Abdomen is nondistended, soft and  nontender. No organomegaly or masses felt. Normal bowel sounds heard. Central nervous system: Alert and oriented. No focal neurological deficits. Extremities: warm, no cyanosis Skin: RLE cellulitis, no lesions or ulcers, on limited exam. Psychiatry:  Mood & affect appropriate.   Data Reviewed: I have personally reviewed following labs and imaging studies  CBC: Recent Labs  Lab 12/24/17 2031 12/25/17 1428 12/26/17 0324  WBC 12.0* 10.7* 10.2  NEUTROABS 10.5* 9.0* 9.0*  HGB 14.3 13.6 13.0  HCT 42.2 39.9 40.2  MCV 94.4 93.7 95.0  PLT 125* 108* 570*   Basic Metabolic Panel: Recent Labs  Lab 12/24/17 2031 12/25/17 1428 12/26/17 0324  NA 137 135 138  K 4.3 4.1 4.0  CL 100* 99* 101  CO2 30 27 27   GLUCOSE 111* 101* 108*  BUN 25* 23* 20  CREATININE 0.70 0.74 0.59*  CALCIUM 9.0 8.7* 8.4*   GFR: Estimated Creatinine Clearance: 92.5 mL/min (A) (by C-G formula based on SCr of 0.59 mg/dL (L)). Liver Function Tests: Recent Labs  Lab 12/25/17 1428 12/26/17 0324  AST 31 37  ALT 17 26  ALKPHOS 64 59  BILITOT 1.1 1.0  PROT 7.3 6.8  ALBUMIN 3.2* 2.9*   No results for input(s): LIPASE, AMYLASE in the last 168 hours. Recent Labs  Lab 12/26/17 0025  AMMONIA 45*   Coagulation Profile: No results for input(s): INR, PROTIME in the last  168 hours. Cardiac Enzymes: No results for input(s): CKTOTAL, CKMB, CKMBINDEX, TROPONINI in the last 168 hours. BNP (last 3 results) No results for input(s): PROBNP in the last 8760 hours. HbA1C: No results for input(s): HGBA1C in the last 72 hours. CBG: Recent Labs  Lab 12/26/17 0019 12/26/17 0547 12/26/17 1117  GLUCAP 111* 124* 111*   Lipid Profile: No results for input(s): CHOL, HDL, LDLCALC, TRIG, CHOLHDL, LDLDIRECT in the last 72 hours. Thyroid Function Tests: No results for input(s): TSH, T4TOTAL, FREET4, T3FREE, THYROIDAB in the last 72 hours. Anemia Panel: No results for input(s): VITAMINB12, FOLATE, FERRITIN, TIBC, IRON,  RETICCTPCT in the last 72 hours. Sepsis Labs: Recent Labs  Lab 12/25/17 1430 12/26/17 0324  PROCALCITON  --  <0.10  LATICACIDVEN 0.81 0.9    Recent Results (from the past 240 hour(s))  Culture, blood (Routine X 2) w Reflex to ID Panel     Status: Abnormal (Preliminary result)   Collection Time: 12/24/17  9:30 PM  Result Value Ref Range Status   Specimen Description   Final    BLOOD RIGHT HAND Performed at [facility], [address] [place], [place] [address]    Special Requests   Final    BOTTLES DRAWN AEROBIC ONLY Blood Culture results may not be optimal due to an inadequate volume of blood received in culture bottles Performed at [facility], [address] [place], [place] [address]    Culture  Setup Time   Final    GRAM POSITIVE COCCI AEROBIC BOTTLE ONLY CRITICAL RESULT CALLED TO, READ BACK BY AND VERIFIED WITHLavell Luster [facility] 2206 12/25/17 A BROWNING Performed at [facility], [place] [address] [place], [place] [address]    Culture STAPHYLOCOCCUS AUREUS (A)  Final   Report Status PENDING  Incomplete  Culture, blood (Routine X 2) w Reflex to ID Panel     Status: Abnormal (Preliminary result)   Collection Time: 12/24/17 10:15 PM  Result Value Ref Range Status   Specimen Description   Final    BLOOD RIGHT ANTECUBITAL Performed at [facility], [address] [place], [place] [address]    Special Requests   Final    BOTTLES DRAWN AEROBIC AND ANAEROBIC Blood Culture adequate volume Performed at [facility], [address] [place], [place] [address]    Culture  Setup Time   Final    GRAM POSITIVE COCCI IN BOTH AEROBIC AND ANAEROBIC BOTTLES CRITICAL RESULT CALLED TO, READ BACK BY AND VERIFIED WITH: Lavell Luster [facility] 2206 12/25/17 A BROWNING    Culture (A)  Final    STAPHYLOCOCCUS AUREUS SUSCEPTIBILITIES TO FOLLOW Performed at [facility], [place] [address], [place], [place] [address]    Report Status PENDING   Incomplete  Blood Culture ID Panel (Reflexed)     Status: Abnormal   Collection Time: 12/24/17 10:15 PM  Result Value Ref Range Status   Enterococcus species NOT DETECTED NOT DETECTED Final   Listeria monocytogenes NOT DETECTED NOT DETECTED Final   Staphylococcus species DETECTED (A) NOT DETECTED Final    Comment: CRITICAL RESULT CALLED TO, READ BACK BY AND VERIFIED WITH: Lavell Luster PH[facility] 2206 12/25/17 A BROWNING    Staphylococcus aureus DETECTED (A) NOT DETECTED Final    Comment: Methicillin (oxacillin) susceptible Staphylococcus aureus (MSSA). Preferred therapy is anti staphylococcal beta lactam antibiotic (Cefazolin or Nafcillin), unless clinically contraindicated. CRITICAL RESULT CALLED TO, READ BACK BY AND VERIFIED WITH:  Lavell Luster PHARMD 2206 12/25/17 A BROWNING    Methicillin resistance NOT DETECTED NOT DETECTED Final   Streptococcus species NOT DETECTED NOT DETECTED Final   Streptococcus agalactiae NOT DETECTED NOT DETECTED Final   Streptococcus pneumoniae NOT DETECTED NOT DETECTED Final   Streptococcus pyogenes NOT DETECTED NOT DETECTED Final   Acinetobacter baumannii NOT DETECTED NOT DETECTED Final   Enterobacteriaceae species NOT DETECTED NOT DETECTED Final   Enterobacter cloacae complex NOT DETECTED NOT DETECTED Final   Escherichia coli NOT DETECTED NOT DETECTED Final   Klebsiella oxytoca NOT DETECTED NOT DETECTED Final   Klebsiella pneumoniae NOT DETECTED NOT DETECTED Final   Proteus species NOT DETECTED NOT DETECTED Final   Serratia marcescens NOT DETECTED NOT DETECTED Final   Haemophilus influenzae NOT DETECTED NOT DETECTED Final   Neisseria meningitidis NOT DETECTED NOT DETECTED Final   Pseudomonas aeruginosa NOT DETECTED NOT DETECTED Final   Candida albicans NOT DETECTED NOT DETECTED Final   Candida glabrata NOT DETECTED NOT DETECTED Final   Candida krusei NOT DETECTED NOT DETECTED Final   Candida parapsilosis NOT DETECTED NOT DETECTED Final   Candida tropicalis NOT  DETECTED NOT DETECTED Final    Comment: Performed at [facility], [place], [place], [place] [address]  MRSA PCR Screening     Status: Abnormal   Collection Time: 12/25/17 11:31 PM  Result Value Ref Range Status   MRSA by PCR POSITIVE (A) NEGATIVE Final    Comment:        The GeneXpert MRSA Assay (FDA approved for NASAL specimens only), is one component of a comprehensive MRSA colonization surveillance program. It is not intended to diagnose MRSA infection nor to guide or monitor treatment for MRSA infections. RESULT CALLED TO, READ BACK BY AND VERIFIED WITH: A SAWYERS,RN @0554  12/26/17 MKELLY Performed at [facility], [place] [address] [place], [place] [address]          Radiology Studies: Dg Chest 1 View  Result Date: 12/24/2017 CLINICAL DATA:  Recent fall with chest pain, initial encounter EXAM: CHEST  1 VIEW COMPARISON:  09/30/2017 FINDINGS: Cardiac shadow is stable. Bibasilar scarring is noted. Chronic changes about the shoulder joints are seen. No acute bony abnormality is noted. IMPRESSION: Chronic changes without acute abnormality. Electronically Signed   By: Inez Catalina M.D.   On: 12/24/2017 20:39   Dg Elbow Complete Left  Result Date: 12/24/2017 CLINICAL DATA:  Recent blunt trauma to elbow with pain, initial encounter EXAM: LEFT ELBOW - COMPLETE 3+ VIEW COMPARISON:  07/05/2010 FINDINGS: Mild degenerative changes of the elbow joint are noted. No acute fracture or dislocation is seen. No soft tissue abnormality is noted. IMPRESSION: Degenerative change without acute abnormality. Electronically Signed   By: Inez Catalina M.D.   On: 12/24/2017 20:39   Ct Head Wo Contrast  Result Date: 12/25/2017 CLINICAL DATA:  Fall, altered level of consciousness EXAM: CT HEAD WITHOUT CONTRAST TECHNIQUE: Contiguous axial images were obtained from the base of the skull through the vertex without intravenous contrast. COMPARISON:  CT head dated 10/05/2017  FINDINGS: Brain: No evidence of acute infarction, hemorrhage, hydrocephalus, extra-axial collection or mass lesion/mass effect. Subcortical white matter and periventricular small vessel ischemic changes. Mild cortical atrophy. Vascular: Intracranial atherosclerosis. Skull: Normal. Negative for fracture or focal lesion. Sinuses/Orbits: The visualized paranasal sinuses are essentially clear. The mastoid air cells are unopacified. Other: None. IMPRESSION: No evidence of acute intracranial abnormality. Atrophy with small vessel ischemic changes. Electronically Signed   By: Bertis Ruddy  [place] Pink M.D.   On: 12/25/2017 15:45   Ct Chest W Contrast  Result Date: 12/25/2017 CLINICAL DATA:  Fall, altered level of consciousness EXAM: CT CHEST, ABDOMEN, AND PELVIS WITH CONTRAST TECHNIQUE: Multidetector CT imaging of the chest, abdomen and pelvis was performed following the standard protocol during bolus administration of intravenous contrast. CONTRAST:  165mL ISOVUE-300 IOPAMIDOL (ISOVUE-300) INJECTION 61% COMPARISON:  CT abdomen/pelvis dated 08/21/2016 FINDINGS: CT CHEST FINDINGS Cardiovascular: No evidence of traumatic aortic injury. Atherosclerotic calcifications the aortic root and arch. The heart is normal in size.  No pericardial effusion. Coronary atherosclerosis of the LAD. Mediastinum/Nodes: No suspicious mediastinal lymphadenopathy. Visualized thyroid is unremarkable. Lungs/Pleura: Mild scarring/atelectasis in the right middle lobe, lingula, and bilateral lower lobes, chronic. Associated left lower lobe bronchiectasis. No focal consolidation. No suspicious pulmonary nodules. No pleural effusion or pneumothorax. Musculoskeletal: Degenerative changes of the thoracic spine. Severe degenerative changes of the bilateral shoulders. No fracture is seen. CT ABDOMEN PELVIS FINDINGS Hepatobiliary: Liver is within normal limits. Gallbladder is unremarkable. No intrahepatic or extrahepatic ductal dilatation. Pancreas: Within normal  limits. Spleen: Within normal limits. Adrenals/Urinary Tract: Adrenal glands are within normal limits. 13 mm interpolar left renal cyst. Right kidney is within normal limits. No hydronephrosis. Bladder is within normal limits. Stomach/Bowel: Stomach is within normal limits. No evidence of bowel obstruction. Normal appendix (series 2/image 37). Vascular/Lymphatic: No evidence of abdominal aortic aneurysm. Atherosclerotic calcifications of the abdominal aorta and branch vessels. No suspicious abdominopelvic lymphadenopathy. Reproductive: Prostate is unremarkable. Other: No abdominopelvic ascites. No hemoperitoneum or free air. Musculoskeletal: Degenerative changes of the lumbar spine. Moderate degenerative changes of the left hip, chronic. No fracture is seen. IMPRESSION: No evidence of traumatic injury to the chest, abdomen, or pelvis. Severe degenerative changes of the bilateral shoulders. Moderate degenerative changes of the left hip, chronic. Additional ancillary findings as above. Electronically Signed   By: Julian Hy M.D.   On: 12/25/2017 15:58   Ct Abdomen Pelvis W Contrast  Result Date: 12/25/2017 CLINICAL DATA:  Fall, altered level of consciousness EXAM: CT CHEST, ABDOMEN, AND PELVIS WITH CONTRAST TECHNIQUE: Multidetector CT imaging of the chest, abdomen and pelvis was performed following the standard protocol during bolus administration of intravenous contrast. CONTRAST:  153mL ISOVUE-300 IOPAMIDOL (ISOVUE-300) INJECTION 61% COMPARISON:  CT abdomen/pelvis dated 08/21/2016 FINDINGS: CT CHEST FINDINGS Cardiovascular: No evidence of traumatic aortic injury. Atherosclerotic calcifications the aortic root and arch. The heart is normal in size.  No pericardial effusion. Coronary atherosclerosis of the LAD. Mediastinum/Nodes: No suspicious mediastinal lymphadenopathy. Visualized thyroid is unremarkable. Lungs/Pleura: Mild scarring/atelectasis in the right middle lobe, lingula, and bilateral lower lobes,  chronic. Associated left lower lobe bronchiectasis. No focal consolidation. No suspicious pulmonary nodules. No pleural effusion or pneumothorax. Musculoskeletal: Degenerative changes of the thoracic spine. Severe degenerative changes of the bilateral shoulders. No fracture is seen. CT ABDOMEN PELVIS FINDINGS Hepatobiliary: Liver is within normal limits. Gallbladder is unremarkable. No intrahepatic or extrahepatic ductal dilatation. Pancreas: Within normal limits. Spleen: Within normal limits. Adrenals/Urinary Tract: Adrenal glands are within normal limits. 13 mm interpolar left renal cyst. Right kidney is within normal limits. No hydronephrosis. Bladder is within normal limits. Stomach/Bowel: Stomach is within normal limits. No evidence of bowel obstruction. Normal appendix (series 2/image 37). Vascular/Lymphatic: No evidence of abdominal aortic aneurysm. Atherosclerotic calcifications of the abdominal aorta and branch vessels. No suspicious abdominopelvic lymphadenopathy. Reproductive: Prostate is unremarkable. Other: No abdominopelvic ascites. No hemoperitoneum or free air. Musculoskeletal: Degenerative changes of the lumbar spine. Moderate degenerative changes of the left  hip, chronic. No fracture is seen. IMPRESSION: No evidence of traumatic injury to the chest, abdomen, or pelvis. Severe degenerative changes of the bilateral shoulders. Moderate degenerative changes of the left hip, chronic. Additional ancillary findings as above. Electronically Signed   By: Julian Hy M.D.   On: 12/25/2017 15:58   Dg Chest Port 1 View  Result Date: 12/25/2017 CLINICAL DATA:  Sepsis. EXAM: PORTABLE CHEST 1 VIEW COMPARISON:  Chest CT 7 hours prior. FINDINGS: Low lung volumes. Heart is enlarged. Streaky bibasilar opacities consistent with atelectasis, similar to prior CT. Bronchovascular crowding versus vascular congestion. No large pleural effusion or pneumothorax. IMPRESSION: Low lung volumes with cardiomegaly and  bronchovascular crowding versus vascular congestion. Bibasilar atelectasis. Electronically Signed   By: Jeb Levering M.D.   On: 12/25/2017 22:46   Dg Shoulder Left  Result Date: 12/24/2017 CLINICAL DATA:  Left shoulder injury.  Hit door frame. EXAM: LEFT SHOULDER - 2+ VIEW COMPARISON:  None. FINDINGS: Severe deformity of the left shoulder with flattening of the humeral head and advanced degenerative changes. This is stable since prior chest x-ray. No acute fracture, subluxation or dislocation. IMPRESSION: Severe chronic deformity of the left glenohumeral joint as above with advanced degenerative changes. No acute bony abnormality. Electronically Signed   By: Rolm Baptise M.D.   On: 12/24/2017 20:37        Scheduled Meds: . ARIPiprazole  30 mg Oral QHS  . aspirin EC  81 mg Oral Daily  . Chlorhexidine Gluconate Cloth  6 each Topical Q0600  . divalproex  1,500 mg Oral QHS  . divalproex  250 mg Oral Q lunch  . enoxaparin (LOVENOX) injection  95 mg Subcutaneous BID  . gabapentin  300 mg Oral Daily  . hydrocortisone sod succinate (SOLU-CORTEF) inj  50 mg Intravenous Q8H  . mupirocin ointment  1 application Nasal BID  . ziprasidone  80 mg Oral QHS   Continuous Infusions: .  ceFAZolin (ANCEF) IV       LOS: 1 day    Time spent: 36 min  Velvet Bathe, MD [facility] Pager [phone]  If 7PM-7AM, please contact night-coverage [email or web address] Password [email or web address] 12/26/2017, 1:13 PM

## 2017-12-26 NOTE — Progress Notes (Signed)
PHARMACY - PHYSICIAN COMMUNICATION CRITICAL VALUE ALERT - BLOOD CULTURE IDENTIFICATION (BCID)  Shaun Yoder is an 72 y.o. male who presented to [facility] on 12/25/2017 with a chief complaint of Cellulitis  Assessment:  Patient with cellulitis.  BCID noted to be MSSA.  (include suspected source if known)  Name of physician (or Provider) Contacted: Bodenheimer  Current antibiotics: Vancomycin and Zosyn   Changes to prescribed antibiotics recommended:  Recommendations accepted by provider---Due to being in ICU and less than 24hr since admit, provider agreed it would be best to have rounding team address after AM rounds.  If wish to de-escalate to Cefazolin 2gm iv q8hr for MSSA.  Results for orders placed or performed during the hospital encounter of 12/24/17  Blood Culture ID Panel (Reflexed) (Collected: 12/24/2017 10:15 PM)  Result Value Ref Range   Enterococcus species NOT DETECTED NOT DETECTED   Listeria monocytogenes NOT DETECTED NOT DETECTED   Staphylococcus species DETECTED (A) NOT DETECTED   Staphylococcus aureus DETECTED (A) NOT DETECTED   Methicillin resistance NOT DETECTED NOT DETECTED   Streptococcus species NOT DETECTED NOT DETECTED   Streptococcus agalactiae NOT DETECTED NOT DETECTED   Streptococcus pneumoniae NOT DETECTED NOT DETECTED   Streptococcus pyogenes NOT DETECTED NOT DETECTED   Acinetobacter baumannii NOT DETECTED NOT DETECTED   Enterobacteriaceae species NOT DETECTED NOT DETECTED   Enterobacter cloacae complex NOT DETECTED NOT DETECTED   Escherichia coli NOT DETECTED NOT DETECTED   Klebsiella oxytoca NOT DETECTED NOT DETECTED   Klebsiella pneumoniae NOT DETECTED NOT DETECTED   Proteus species NOT DETECTED NOT DETECTED   Serratia marcescens NOT DETECTED NOT DETECTED   Haemophilus influenzae NOT DETECTED NOT DETECTED   Neisseria meningitidis NOT DETECTED NOT DETECTED   Pseudomonas aeruginosa NOT DETECTED NOT DETECTED   Candida albicans NOT DETECTED NOT  DETECTED   Candida glabrata NOT DETECTED NOT DETECTED   Candida krusei NOT DETECTED NOT DETECTED   Candida parapsilosis NOT DETECTED NOT DETECTED   Candida tropicalis NOT DETECTED NOT DETECTED    Nani Skillern Crowford 12/26/2017  12:18 AM

## 2017-12-26 NOTE — Progress Notes (Signed)
  Echocardiogram 2D Echocardiogram has been performed.  Shaun Yoder 12/26/2017, 4:08 PM

## 2017-12-27 LAB — CBC
HCT: 37.6 % — ABNORMAL LOW (ref 39.0–52.0)
HEMOGLOBIN: 12.5 g/dL — AB (ref 13.0–17.0)
MCH: 31.5 pg (ref 26.0–34.0)
MCHC: 33.2 g/dL (ref 30.0–36.0)
MCV: 94.7 fL (ref 78.0–100.0)
PLATELETS: 111 10*3/uL — AB (ref 150–400)
RBC: 3.97 MIL/uL — ABNORMAL LOW (ref 4.22–5.81)
RDW: 15.9 % — ABNORMAL HIGH (ref 11.5–15.5)
WBC: 7.3 10*3/uL (ref 4.0–10.5)

## 2017-12-27 LAB — CULTURE, BLOOD (ROUTINE X 2): Special Requests: ADEQUATE

## 2017-12-27 LAB — GLUCOSE, CAPILLARY
GLUCOSE-CAPILLARY: 163 mg/dL — AB (ref 65–99)
Glucose-Capillary: 125 mg/dL — ABNORMAL HIGH (ref 65–99)
Glucose-Capillary: 132 mg/dL — ABNORMAL HIGH (ref 65–99)
Glucose-Capillary: 167 mg/dL — ABNORMAL HIGH (ref 65–99)

## 2017-12-27 MED ORDER — POLYVINYL ALCOHOL 1.4 % OP SOLN
1.0000 [drp] | OPHTHALMIC | Status: DC | PRN
  Filled 2017-12-27 (×2): qty 15

## 2017-12-27 NOTE — Progress Notes (Signed)
PROGRESS NOTE    Shaun Yoder  WUJ:[id number] DOB: 01/13/1946 DOA: 12/25/2017 PCP: Colon Branch, MD    Brief Narrative:  72 y.o. male with history of rheumatoid arthritis, severe aortic stenosis, thrombocytopenia, DVT on apixaban presents to the ER because of increasing confusion and fever.  Pt diagnosed with sepsis secondary to cellulitis. Dx with Staph Aureus bacteremia.    Assessment & Plan:   Principal Problem:   Bacteremia due to methicillin susceptible Staphylococcus aureus (MSSA)/Sepsis (HCC)/Cellulitis of right leg - ID consulted and assisting with case. - Will continue Cefazolin. Awaiting from repeat blood cultures to be negative before placing picc line. - TTE  Active Problems:   Aortic stenosis, severe - stable    Schizoaffective disorder, bipolar type (HCC) - stable    Rheumatoid arthritis ([place]) - stable    Left leg DVT ([place]) - Pt is on Lovenox  DVT prophylaxis: Lovenox Code Status: Full Family Communication: none at bedside.  Disposition Plan:    Consultants:   ID   Procedures: none   Antimicrobials: Ancef   Subjective: He reports no new complaints currently.   Objective: Vitals:   12/27/17 0500 12/27/17 0600 12/27/17 0700 12/27/17 0800  BP: 117/65 126/62 (!) 160/44 126/65  Pulse: 61 (!) 54 (!) 50 (!) 56  Resp: 19 (!) 21 (!) 24 20  Temp:    (!) 97.4 F (36.3 C)  TempSrc:    Axillary  SpO2: 97% 97% 93% 91%  Weight:      Height:        Intake/Output Summary (Last 24 hours) at 12/27/2017 1022 Last data filed at 12/27/2017 9562 Gross per 24 hour  Intake 350 ml  Output -  Net 350 ml   Filed Weights   12/25/17 1421  Weight: 93.9 kg (207 lb)    Examination:  General exam: Pt in nad, Appears calm and comfortable Respiratory system: Clear to auscultation. Respiratory effort normal. Equal chest rise.  Cardiovascular system: S1 & S2 heard, RRR. No JVD, murmurs, rubs, gallops  Gastrointestinal system: Abdomen is nondistended, soft  and nontender. No organomegaly or masses felt. Normal bowel sounds heard. Central nervous system: Alert and oriented. No focal neurological deficits. Extremities: warm, no cyanosis Skin: RLE cellulitis, no lesions or ulcers, on limited exam. Psychiatry:  Mood & affect appropriate.   Data Reviewed: I have personally reviewed following labs and imaging studies  CBC: Recent Labs  Lab 12/24/17 2031 12/25/17 1428 12/26/17 0324 12/27/17 0658  WBC 12.0* 10.7* 10.2 7.3  NEUTROABS 10.5* 9.0* 9.0*  --   HGB 14.3 13.6 13.0 12.5*  HCT 42.2 39.9 40.2 37.6*  MCV 94.4 93.7 95.0 94.7  PLT 125* 108* 107* 130*   Basic Metabolic Panel: Recent Labs  Lab 12/24/17 2031 12/25/17 1428 12/26/17 0324  NA 137 135 138  K 4.3 4.1 4.0  CL 100* 99* 101  CO2 30 27 27   GLUCOSE 111* 101* 108*  BUN 25* 23* 20  CREATININE 0.70 0.74 0.59*  CALCIUM 9.0 8.7* 8.4*   GFR: Estimated Creatinine Clearance: 92.5 mL/min (A) (by C-G formula based on SCr of 0.59 mg/dL (L)). Liver Function Tests: Recent Labs  Lab 12/25/17 1428 12/26/17 0324  AST 31 37  ALT 17 26  ALKPHOS 64 59  BILITOT 1.1 1.0  PROT 7.3 6.8  ALBUMIN 3.2* 2.9*   No results for input(s): LIPASE, AMYLASE in the last 168 hours. Recent Labs  Lab 12/26/17 0025  AMMONIA 45*   Coagulation Profile: No results for input(s):  INR, PROTIME in the last 168 hours. Cardiac Enzymes: No results for input(s): CKTOTAL, CKMB, CKMBINDEX, TROPONINI in the last 168 hours. BNP (last 3 results) No results for input(s): PROBNP in the last 8760 hours. HbA1C: No results for input(s): HGBA1C in the last 72 hours. CBG: Recent Labs  Lab 12/26/17 0547 12/26/17 1117 12/26/17 1722 12/26/17 2338 12/27/17 0603  GLUCAP 124* 111* 155* 123* 125*   Lipid Profile: No results for input(s): CHOL, HDL, LDLCALC, TRIG, CHOLHDL, LDLDIRECT in the last 72 hours. Thyroid Function Tests: No results for input(s): TSH, T4TOTAL, FREET4, T3FREE, THYROIDAB in the last 72  hours. Anemia Panel: No results for input(s): VITAMINB12, FOLATE, FERRITIN, TIBC, IRON, RETICCTPCT in the last 72 hours. Sepsis Labs: Recent Labs  Lab 12/25/17 1430 12/26/17 0324  PROCALCITON  --  <0.10  LATICACIDVEN 0.81 0.9    Recent Results (from the past 240 hour(s))  Culture, blood (Routine X 2) w Reflex to ID Panel     Status: Abnormal   Collection Time: 12/24/17  9:30 PM  Result Value Ref Range Status   Specimen Description   Final    BLOOD RIGHT HAND Performed at [facility], [address] [place], [place] [address]    Special Requests   Final    BOTTLES DRAWN AEROBIC ONLY Blood Culture results may not be optimal due to an inadequate volume of blood received in culture bottles Performed at [facility], [address] [place], [place] [address]    Culture  Setup Time   Final    GRAM POSITIVE COCCI AEROBIC BOTTLE ONLY CRITICAL RESULT CALLED TO, READ BACK BY AND VERIFIED WITH: Lavell Luster [facility] 2206 12/25/17 A BROWNING    Culture (A)  Final    STAPHYLOCOCCUS AUREUS SUSCEPTIBILITIES PERFORMED ON PREVIOUS CULTURE WITHIN THE LAST 5 DAYS. Performed at [facility], [place] [address], [place], [place] [address]    Report Status 12/27/2017 FINAL  Final  Culture, blood (Routine X 2) w Reflex to ID Panel     Status: Abnormal   Collection Time: 12/24/17 10:15 PM  Result Value Ref Range Status   Specimen Description   Final    BLOOD RIGHT ANTECUBITAL Performed at [facility], [address] [place], [place] [address]    Special Requests   Final    BOTTLES DRAWN AEROBIC AND ANAEROBIC Blood Culture adequate volume Performed at [facility], [address] [place], [place] [address]    Culture  Setup Time   Final    GRAM POSITIVE COCCI IN BOTH AEROBIC AND ANAEROBIC BOTTLES CRITICAL RESULT CALLED TO, READ BACK BY AND VERIFIED WITHLavell Luster [facility] 2206 12/25/17 A BROWNING Performed at [facility], [place] [address], [place], [place] [address]    Culture STAPHYLOCOCCUS AUREUS (A)  Final   Report Status 12/27/2017 FINAL  Final   Organism ID, Bacteria STAPHYLOCOCCUS AUREUS  Final      Susceptibility   Staphylococcus aureus - MIC*    CIPROFLOXACIN <=0.5 SENSITIVE Sensitive     ERYTHROMYCIN <=0.25 SENSITIVE Sensitive     GENTAMICIN <=0.5 SENSITIVE Sensitive     OXACILLIN 0.5 SENSITIVE Sensitive     TETRACYCLINE <=1 SENSITIVE Sensitive     VANCOMYCIN 1 SENSITIVE Sensitive     TRIMETH/SULFA <=10 SENSITIVE Sensitive     CLINDAMYCIN <=0.25 SENSITIVE Sensitive     RIFAMPIN <=0.5 SENSITIVE Sensitive     Inducible Clindamycin NEGATIVE Sensitive     *  STAPHYLOCOCCUS AUREUS  Blood Culture ID Panel (Reflexed)     Status: Abnormal   Collection Time: 12/24/17 10:15 PM  Result Value Ref Range Status   Enterococcus species NOT DETECTED NOT DETECTED Final   Listeria monocytogenes NOT DETECTED NOT DETECTED Final   Staphylococcus species DETECTED (A) NOT DETECTED Final    Comment: CRITICAL RESULT CALLED TO, READ BACK BY AND VERIFIED WITH: Lavell Luster [facility] 2206 12/25/17 A BROWNING    Staphylococcus aureus DETECTED (A) NOT DETECTED Final    Comment: Methicillin (oxacillin) susceptible Staphylococcus aureus (MSSA). Preferred therapy is anti staphylococcal beta lactam antibiotic (Cefazolin or Nafcillin), unless clinically contraindicated. CRITICAL RESULT CALLED TO, READ BACK BY AND VERIFIED WITH: Lavell Luster [facility] 2206 12/25/17 A BROWNING    Methicillin resistance NOT DETECTED NOT DETECTED Final   Streptococcus species NOT DETECTED NOT DETECTED Final   Streptococcus agalactiae NOT DETECTED NOT DETECTED Final   Streptococcus pneumoniae NOT DETECTED NOT DETECTED Final   Streptococcus pyogenes NOT DETECTED NOT DETECTED Final   Acinetobacter baumannii NOT DETECTED NOT DETECTED Final   Enterobacteriaceae species NOT DETECTED NOT DETECTED Final   Enterobacter cloacae complex NOT DETECTED NOT DETECTED Final   Escherichia  coli NOT DETECTED NOT DETECTED Final   Klebsiella oxytoca NOT DETECTED NOT DETECTED Final   Klebsiella pneumoniae NOT DETECTED NOT DETECTED Final   Proteus species NOT DETECTED NOT DETECTED Final   Serratia marcescens NOT DETECTED NOT DETECTED Final   Haemophilus influenzae NOT DETECTED NOT DETECTED Final   Neisseria meningitidis NOT DETECTED NOT DETECTED Final   Pseudomonas aeruginosa NOT DETECTED NOT DETECTED Final   Candida albicans NOT DETECTED NOT DETECTED Final   Candida glabrata NOT DETECTED NOT DETECTED Final   Candida krusei NOT DETECTED NOT DETECTED Final   Candida parapsilosis NOT DETECTED NOT DETECTED Final   Candida tropicalis NOT DETECTED NOT DETECTED Final    Comment: Performed at [facility], [address], [place], [place] [address]  Blood culture (routine x 2)     Status: None (Preliminary result)   Collection Time: 12/25/17  2:36 PM  Result Value Ref Range Status   Specimen Description   Final    BLOOD LEFT ARM Performed at [facility], [address] [place], [place] [address]    Special Requests   Final    BOTTLES DRAWN AEROBIC AND ANAEROBIC Blood Culture adequate volume Performed at [facility], [address] [place], [place] [address]    Culture   Final    NO GROWTH < 24 HOURS Performed at [facility], [place] [address] [place], [place] [address]    Report Status PENDING  Incomplete  Blood culture (routine x 2)     Status: None (Preliminary result)   Collection Time: 12/25/17  2:41 PM  Result Value Ref Range Status   Specimen Description   Final    BLOOD RIGHT HAND Performed at [facility], [address] [place], [place] [address]    Special Requests   Final    BOTTLES DRAWN AEROBIC AND ANAEROBIC Blood Culture adequate volume Performed at [facility], [address] [place], [place] [address]    Culture   Final    NO GROWTH < 24 HOURS Performed at [facility], [place] [address] [place], [place] [address]    Report Status PENDING  Incomplete  MRSA PCR Screening     Status: Abnormal   Collection Time:  12/25/17 11:31 PM  Result Value Ref Range Status   MRSA by PCR POSITIVE (A) NEGATIVE Final    Comment:        The GeneXpert MRSA Assay (FDA approved for NASAL specimens only), is one component of a comprehensive MRSA colonization surveillance program. It is not intended to diagnose MRSA infection nor to guide or monitor treatment for MRSA infections. RESULT CALLED TO, READ BACK BY AND VERIFIED WITH: A SAWYERS,RN @0554  12/26/17 MKELLY Performed at [facility], [place] [address] [place], [place] [address]          Radiology Studies: Ct Head Wo Contrast  Result Date: 12/25/2017 CLINICAL DATA:  Fall, altered level of consciousness EXAM: CT HEAD WITHOUT CONTRAST TECHNIQUE: Contiguous axial images were obtained from the base of the skull through the vertex without intravenous contrast. COMPARISON:  CT head dated 10/05/2017 FINDINGS: Brain: No evidence of acute infarction, hemorrhage, hydrocephalus, extra-axial collection or mass lesion/mass effect. Subcortical white matter and periventricular small vessel ischemic changes. Mild cortical atrophy. Vascular: Intracranial atherosclerosis. Skull: Normal. Negative for fracture or focal lesion. Sinuses/Orbits: The visualized paranasal sinuses are essentially clear. The mastoid air cells are unopacified. Other: None. IMPRESSION: No evidence of acute intracranial abnormality. Atrophy with small vessel ischemic changes. Electronically Signed   By: Julian Hy M.D.   On: 12/25/2017 15:45   Ct Chest W Contrast  Result Date: 12/25/2017 CLINICAL DATA:  Fall, altered level of consciousness EXAM: CT CHEST, ABDOMEN, AND PELVIS WITH CONTRAST TECHNIQUE: Multidetector CT imaging of the chest, abdomen and pelvis was performed following the standard protocol during bolus administration of intravenous contrast.  CONTRAST:  141mL ISOVUE-300 IOPAMIDOL (ISOVUE-300) INJECTION 61% COMPARISON:  CT abdomen/pelvis dated 08/21/2016 FINDINGS: CT CHEST FINDINGS Cardiovascular: No evidence of traumatic aortic injury. Atherosclerotic calcifications the aortic root and arch. The heart is normal in size.  No pericardial effusion. Coronary atherosclerosis of the LAD. Mediastinum/Nodes: No suspicious mediastinal lymphadenopathy. Visualized thyroid is unremarkable. Lungs/Pleura: Mild scarring/atelectasis in the right middle lobe, lingula, and bilateral lower lobes, chronic. Associated left lower lobe bronchiectasis. No focal consolidation. No suspicious pulmonary nodules. No pleural effusion or pneumothorax. Musculoskeletal: Degenerative changes of the thoracic spine. Severe degenerative changes of the bilateral shoulders. No fracture is seen. CT ABDOMEN PELVIS FINDINGS Hepatobiliary: Liver is within normal limits. Gallbladder is unremarkable. No intrahepatic or extrahepatic ductal dilatation. Pancreas: Within normal limits. Spleen: Within normal limits. Adrenals/Urinary Tract: Adrenal glands are within normal limits. 13 mm interpolar left renal cyst. Right kidney is within normal limits. No hydronephrosis. Bladder is within normal limits. Stomach/Bowel: Stomach is within normal limits. No evidence of bowel obstruction. Normal appendix (series 2/image 37). Vascular/Lymphatic: No evidence of abdominal aortic aneurysm. Atherosclerotic calcifications of the abdominal aorta and branch vessels. No suspicious abdominopelvic lymphadenopathy. Reproductive: Prostate is unremarkable. Other: No abdominopelvic ascites. No hemoperitoneum or free air. Musculoskeletal: Degenerative changes of the lumbar spine. Moderate degenerative changes of the left hip, chronic. No fracture is seen. IMPRESSION: No evidence of traumatic injury to the chest, abdomen, or pelvis. Severe degenerative changes of the bilateral shoulders. Moderate degenerative changes of the  left hip, chronic. Additional ancillary findings as above. Electronically Signed   By: Julian Hy M.D.   On: 12/25/2017 15:58   Ct Abdomen Pelvis W Contrast  Result Date: 12/25/2017 CLINICAL DATA:  Fall, altered level of consciousness EXAM: CT CHEST, ABDOMEN, AND PELVIS WITH CONTRAST TECHNIQUE: Multidetector CT imaging of the chest, abdomen and pelvis was performed following the standard protocol during bolus administration of  intravenous contrast. CONTRAST:  122mL ISOVUE-300 IOPAMIDOL (ISOVUE-300) INJECTION 61% COMPARISON:  CT abdomen/pelvis dated 08/21/2016 FINDINGS: CT CHEST FINDINGS Cardiovascular: No evidence of traumatic aortic injury. Atherosclerotic calcifications the aortic root and arch. The heart is normal in size.  No pericardial effusion. Coronary atherosclerosis of the LAD. Mediastinum/Nodes: No suspicious mediastinal lymphadenopathy. Visualized thyroid is unremarkable. Lungs/Pleura: Mild scarring/atelectasis in the right middle lobe, lingula, and bilateral lower lobes, chronic. Associated left lower lobe bronchiectasis. No focal consolidation. No suspicious pulmonary nodules. No pleural effusion or pneumothorax. Musculoskeletal: Degenerative changes of the thoracic spine. Severe degenerative changes of the bilateral shoulders. No fracture is seen. CT ABDOMEN PELVIS FINDINGS Hepatobiliary: Liver is within normal limits. Gallbladder is unremarkable. No intrahepatic or extrahepatic ductal dilatation. Pancreas: Within normal limits. Spleen: Within normal limits. Adrenals/Urinary Tract: Adrenal glands are within normal limits. 13 mm interpolar left renal cyst. Right kidney is within normal limits. No hydronephrosis. Bladder is within normal limits. Stomach/Bowel: Stomach is within normal limits. No evidence of bowel obstruction. Normal appendix (series 2/image 37). Vascular/Lymphatic: No evidence of abdominal aortic aneurysm. Atherosclerotic calcifications of the abdominal aorta and branch  vessels. No suspicious abdominopelvic lymphadenopathy. Reproductive: Prostate is unremarkable. Other: No abdominopelvic ascites. No hemoperitoneum or free air. Musculoskeletal: Degenerative changes of the lumbar spine. Moderate degenerative changes of the left hip, chronic. No fracture is seen. IMPRESSION: No evidence of traumatic injury to the chest, abdomen, or pelvis. Severe degenerative changes of the bilateral shoulders. Moderate degenerative changes of the left hip, chronic. Additional ancillary findings as above. Electronically Signed   By: Julian Hy M.D.   On: 12/25/2017 15:58   Dg Chest Port 1 View  Result Date: 12/25/2017 CLINICAL DATA:  Sepsis. EXAM: PORTABLE CHEST 1 VIEW COMPARISON:  Chest CT 7 hours prior. FINDINGS: Low lung volumes. Heart is enlarged. Streaky bibasilar opacities consistent with atelectasis, similar to prior CT. Bronchovascular crowding versus vascular congestion. No large pleural effusion or pneumothorax. IMPRESSION: Low lung volumes with cardiomegaly and bronchovascular crowding versus vascular congestion. Bibasilar atelectasis. Electronically Signed   By: Jeb Levering M.D.   On: 12/25/2017 22:46        Scheduled Meds: . ARIPiprazole  30 mg Oral QHS  . aspirin EC  81 mg Oral Daily  . Chlorhexidine Gluconate Cloth  6 each Topical Q0600  . divalproex  1,500 mg Oral QHS  . divalproex  250 mg Oral Q lunch  . enoxaparin (LOVENOX) injection  95 mg Subcutaneous BID  . gabapentin  300 mg Oral Daily  . hydrocortisone sod succinate (SOLU-CORTEF) inj  50 mg Intravenous Q8H  . mupirocin ointment  1 application Nasal BID  . ziprasidone  80 mg Oral QHS   Continuous Infusions: .  ceFAZolin (ANCEF) IV Stopped (12/27/17 3976)     LOS: 2 days    Time spent: 70 min  Velvet Bathe, MD [facility] Pager [phone]  If 7PM-7AM, please contact night-coverage [email or web address] Password [facility] 12/27/2017, 10:22 AM

## 2017-12-28 ENCOUNTER — Telehealth: Payer: Self-pay

## 2017-12-28 LAB — GLUCOSE, CAPILLARY
GLUCOSE-CAPILLARY: 100 mg/dL — AB (ref 65–99)
GLUCOSE-CAPILLARY: 207 mg/dL — AB (ref 65–99)
GLUCOSE-CAPILLARY: 97 mg/dL (ref 65–99)
Glucose-Capillary: 99 mg/dL (ref 65–99)

## 2017-12-28 MED ORDER — MIRABEGRON ER 25 MG PO TB24
50.0000 mg | ORAL_TABLET | Freq: Every day | ORAL | Status: DC
  Administered 2017-12-28 – 2017-12-31 (×4): 50 mg via ORAL
  Filled 2017-12-28 (×4): qty 2

## 2017-12-28 NOTE — Progress Notes (Addendum)
AC called and talked to pt. Family continues to say that medication was in room prior. RN looked into prior history of pt in this same room. The name on the wrong medication bag and the prior patient in the room do not match. Medication was brought down from ICU and was already placed in belongings prior to transport. AC provided family with patient experience card. Family informed AC that they would like to double verify any medications that have the pt name on it that are brought into the room by nurses.

## 2017-12-28 NOTE — Telephone Encounter (Signed)
+   BC from ED 12/24/17 currently admitted per [facility] D and being treated

## 2017-12-28 NOTE — Progress Notes (Signed)
PROGRESS NOTE    Shaun Yoder  WUJ:[id number] DOB: 1945/08/09 DOA: 12/25/2017 PCP: Colon Branch, MD    Brief Narrative:  72 y.o. male with history of rheumatoid arthritis, severe aortic stenosis, thrombocytopenia, DVT on apixaban presents to the ER because of increasing confusion and fever.  Pt diagnosed with sepsis secondary to cellulitis. Dx with Staph Aureus bacteremia.    Assessment & Plan:   Principal Problem:   Bacteremia due to methicillin susceptible Staphylococcus aureus (MSSA)/Sepsis (HCC)/Cellulitis of right leg - ID consulted and assisting with case. - Continue Cefazolin. Awaiting from repeat blood cultures to be negative before placing picc line. - TTE reporting EF of 60-65% with grade 1 diastolic dysfunction  Active Problems:   Aortic stenosis, severe - stable    Schizoaffective disorder, bipolar type (HCC) - stable    Rheumatoid arthritis ([place]) - stable    Left leg DVT ([place]) - Pt is on Lovenox  DVT prophylaxis: Lovenox Code Status: Full Family Communication: None at bedside.  Disposition Plan: Pending improvement in condition  Consultants:   ID   Procedures: none   Antimicrobials: Ancef   Subjective: He reports no new complaints currently.   Objective: Vitals:   12/27/17 1200 12/27/17 1529 12/27/17 2129 12/28/17 0606  BP:  129/84 133/72 126/75  Pulse:  82 64 (!) 49  Resp:  18 18 18   Temp: 98.3 F (36.8 C) 98.5 F (36.9 C) 98.7 F (37.1 C) 97.8 F (36.6 C)  TempSrc: Axillary Oral Oral Oral  SpO2:  95% 94% 94%  Weight:      Height:        Intake/Output Summary (Last 24 hours) at 12/28/2017 1207 Last data filed at 12/28/2017 9562 Gross per 24 hour  Intake 200 ml  Output 1175 ml  Net -975 ml   Filed Weights   12/25/17 1421  Weight: 93.9 kg (207 lb)    Examination: exam unchanged when compared to prior on 12/27/17  General exam: Pt in nad, Appears calm and comfortable Respiratory system: Clear to auscultation. Respiratory  effort normal. Equal chest rise.  Cardiovascular system: S1 & S2 heard, RRR. No JVD, murmurs, rubs, gallops  Gastrointestinal system: Abdomen is nondistended, soft and nontender. No organomegaly or masses felt. Normal bowel sounds heard. Central nervous system: Alert and oriented. No focal neurological deficits. Extremities: warm, no cyanosis Skin: RLE cellulitis, no lesions or ulcers, on limited exam. Psychiatry:  Mood & affect appropriate.   Data Reviewed: I have personally reviewed following labs and imaging studies  CBC: Recent Labs  Lab 12/24/17 2031 12/25/17 1428 12/26/17 0324 12/27/17 0658  WBC 12.0* 10.7* 10.2 7.3  NEUTROABS 10.5* 9.0* 9.0*  --   HGB 14.3 13.6 13.0 12.5*  HCT 42.2 39.9 40.2 37.6*  MCV 94.4 93.7 95.0 94.7  PLT 125* 108* 107* 130*   Basic Metabolic Panel: Recent Labs  Lab 12/24/17 2031 12/25/17 1428 12/26/17 0324  NA 137 135 138  K 4.3 4.1 4.0  CL 100* 99* 101  CO2 30 27 27   GLUCOSE 111* 101* 108*  BUN 25* 23* 20  CREATININE 0.70 0.74 0.59*  CALCIUM 9.0 8.7* 8.4*   GFR: Estimated Creatinine Clearance: 92.5 mL/min (A) (by C-G formula based on SCr of 0.59 mg/dL (L)). Liver Function Tests: Recent Labs  Lab 12/25/17 1428 12/26/17 0324  AST 31 37  ALT 17 26  ALKPHOS 64 59  BILITOT 1.1 1.0  PROT 7.3 6.8  ALBUMIN 3.2* 2.9*   No results for input(s): LIPASE, AMYLASE  in the last 168 hours. Recent Labs  Lab 12/26/17 0025  AMMONIA 45*   Coagulation Profile: No results for input(s): INR, PROTIME in the last 168 hours. Cardiac Enzymes: No results for input(s): CKTOTAL, CKMB, CKMBINDEX, TROPONINI in the last 168 hours. BNP (last 3 results) No results for input(s): PROBNP in the last 8760 hours. HbA1C: No results for input(s): HGBA1C in the last 72 hours. CBG: Recent Labs  Lab 12/27/17 1150 12/27/17 1919 12/27/17 2355 12/28/17 0608 12/28/17 1127  GLUCAP 132* 163* 167* 99 207*   Lipid Profile: No results for input(s): CHOL, HDL,  LDLCALC, TRIG, CHOLHDL, LDLDIRECT in the last 72 hours. Thyroid Function Tests: No results for input(s): TSH, T4TOTAL, FREET4, T3FREE, THYROIDAB in the last 72 hours. Anemia Panel: No results for input(s): VITAMINB12, FOLATE, FERRITIN, TIBC, IRON, RETICCTPCT in the last 72 hours. Sepsis Labs: Recent Labs  Lab 12/25/17 1430 12/26/17 0324  PROCALCITON  --  <0.10  LATICACIDVEN 0.81 0.9    Recent Results (from the past 240 hour(s))  Culture, blood (Routine X 2) w Reflex to ID Panel     Status: Abnormal   Collection Time: 12/24/17  9:30 PM  Result Value Ref Range Status   Specimen Description   Final    BLOOD RIGHT HAND Performed at [facility], [address] [place], [place] [address]    Special Requests   Final    BOTTLES DRAWN AEROBIC ONLY Blood Culture results may not be optimal due to an inadequate volume of blood received in culture bottles Performed at [facility], [address] [place], [place] [address]    Culture  Setup Time   Final    GRAM POSITIVE COCCI AEROBIC BOTTLE ONLY CRITICAL RESULT CALLED TO, READ BACK BY AND VERIFIED WITH: Lavell Luster [facility] 2206 12/25/17 A BROWNING    Culture (A)  Final    STAPHYLOCOCCUS AUREUS SUSCEPTIBILITIES PERFORMED ON PREVIOUS CULTURE WITHIN THE LAST 5 DAYS. Performed at [facility], [place] [address] [place], [place] [address]    Report Status 12/27/2017 FINAL  Final  Culture, blood (Routine X 2) w Reflex to ID Panel     Status: Abnormal   Collection Time: 12/24/17 10:15 PM  Result Value Ref Range Status   Specimen Description   Final    BLOOD RIGHT ANTECUBITAL Performed at [facility], [address] [place], [place] [address]    Special Requests   Final    BOTTLES DRAWN AEROBIC AND ANAEROBIC Blood Culture adequate volume Performed at [facility], [address] [place], [place] [address]    Culture  Setup Time   Final    GRAM POSITIVE COCCI IN BOTH AEROBIC AND ANAEROBIC  BOTTLES CRITICAL RESULT CALLED TO, READ BACK BY AND VERIFIED WITHLavell Luster [facility] 2206 12/25/17 A BROWNING Performed at [facility], [place] [address] [place],  [address]    Culture STAPHYLOCOCCUS AUREUS (A)  Final   Report Status 12/27/2017 FINAL  Final   Organism ID, Bacteria STAPHYLOCOCCUS AUREUS  Final      Susceptibility   Staphylococcus aureus - MIC*    CIPROFLOXACIN <=0.5 SENSITIVE Sensitive     ERYTHROMYCIN <=0.25 SENSITIVE Sensitive     GENTAMICIN <=0.5 SENSITIVE Sensitive     OXACILLIN 0.5 SENSITIVE Sensitive     TETRACYCLINE <=1 SENSITIVE Sensitive     VANCOMYCIN 1 SENSITIVE Sensitive     TRIMETH/SULFA <=10 SENSITIVE Sensitive  CLINDAMYCIN <=0.25 SENSITIVE Sensitive     RIFAMPIN <=0.5 SENSITIVE Sensitive     Inducible Clindamycin NEGATIVE Sensitive     * STAPHYLOCOCCUS AUREUS  Blood Culture ID Panel (Reflexed)     Status: Abnormal   Collection Time: 12/24/17 10:15 PM  Result Value Ref Range Status   Enterococcus species NOT DETECTED NOT DETECTED Final   Listeria monocytogenes NOT DETECTED NOT DETECTED Final   Staphylococcus species DETECTED (A) NOT DETECTED Final    Comment: CRITICAL RESULT CALLED TO, READ BACK BY AND VERIFIED WITH: Lavell Luster [facility] 2206 12/25/17 A BROWNING    Staphylococcus aureus DETECTED (A) NOT DETECTED Final    Comment: Methicillin (oxacillin) susceptible Staphylococcus aureus (MSSA). Preferred therapy is anti staphylococcal beta lactam antibiotic (Cefazolin or Nafcillin), unless clinically contraindicated. CRITICAL RESULT CALLED TO, READ BACK BY AND VERIFIED WITH: Lavell Luster [facility] 2206 12/25/17 A BROWNING    Methicillin resistance NOT DETECTED NOT DETECTED Final   Streptococcus species NOT DETECTED NOT DETECTED Final   Streptococcus agalactiae NOT DETECTED NOT DETECTED Final   Streptococcus pneumoniae NOT DETECTED NOT DETECTED Final   Streptococcus pyogenes NOT DETECTED NOT DETECTED Final   Acinetobacter baumannii NOT DETECTED NOT  DETECTED Final   Enterobacteriaceae species NOT DETECTED NOT DETECTED Final   Enterobacter cloacae complex NOT DETECTED NOT DETECTED Final   Escherichia coli NOT DETECTED NOT DETECTED Final   Klebsiella oxytoca NOT DETECTED NOT DETECTED Final   Klebsiella pneumoniae NOT DETECTED NOT DETECTED Final   Proteus species NOT DETECTED NOT DETECTED Final   Serratia marcescens NOT DETECTED NOT DETECTED Final   Haemophilus influenzae NOT DETECTED NOT DETECTED Final   Neisseria meningitidis NOT DETECTED NOT DETECTED Final   Pseudomonas aeruginosa NOT DETECTED NOT DETECTED Final   Candida albicans NOT DETECTED NOT DETECTED Final   Candida glabrata NOT DETECTED NOT DETECTED Final   Candida krusei NOT DETECTED NOT DETECTED Final   Candida parapsilosis NOT DETECTED NOT DETECTED Final   Candida tropicalis NOT DETECTED NOT DETECTED Final    Comment: Performed at [facility], [address] [place], [place] [address]  Blood culture (routine x 2)     Status: None (Preliminary result)   Collection Time: 12/25/17  2:36 PM  Result Value Ref Range Status   Specimen Description   Final    BLOOD LEFT ARM Performed at [facility], [address] [place], [place] [address]    Special Requests   Final    BOTTLES DRAWN AEROBIC AND ANAEROBIC Blood Culture adequate volume Performed at [facility], [address] [place], [place] [address]    Culture   Final    NO GROWTH 2 DAYS Performed at [facility], [place] [address], [place], [place] [address]    Report Status PENDING  Incomplete  Blood culture (routine x 2)     Status: None (Preliminary result)   Collection Time: 12/25/17  2:41 PM  Result Value Ref Range Status   Specimen Description   Final    BLOOD RIGHT HAND Performed at [facility], [address] [place], [place] [address]    Special Requests   Final    BOTTLES DRAWN AEROBIC AND ANAEROBIC Blood Culture adequate volume Performed at [facility], [address] [place], [place] [address]    Culture   Final    NO GROWTH 2 DAYS Performed at [facility], [place] [address], [place], [place]  [address]    Report Status PENDING  Incomplete  MRSA PCR Screening     Status: Abnormal   Collection Time: 12/25/17 11:31 PM  Result Value Ref Range Status   MRSA by PCR POSITIVE (A) NEGATIVE Final    Comment:        The GeneXpert MRSA Assay (FDA approved for NASAL specimens only), is one component of a comprehensive MRSA colonization surveillance program. It is not intended to diagnose MRSA infection nor to guide or monitor treatment for MRSA infections. RESULT CALLED TO, READ BACK BY AND VERIFIED WITH: A SAWYERS,RN @0554  12/26/17 MKELLY Performed at [facility], [place] [address], [place], [place] [address]          Radiology Studies: No results found.      Scheduled Meds: . ARIPiprazole  30 mg Oral QHS  . aspirin EC  81 mg Oral Daily  . Chlorhexidine Gluconate Cloth  6 each Topical Q0600  . divalproex  1,500 mg Oral QHS  . divalproex  250 mg Oral Q lunch  . enoxaparin (LOVENOX) injection  95 mg Subcutaneous BID  . gabapentin  300 mg Oral Daily  . hydrocortisone sod succinate (SOLU-CORTEF) inj  50 mg Intravenous Q8H  . mirabegron ER  50 mg Oral Daily  . mupirocin ointment  1 application Nasal BID  . ziprasidone  80 mg Oral QHS   Continuous Infusions: .  ceFAZolin (ANCEF) IV 2 g (12/28/17 0606)     LOS: 3 days    Time spent: 81 min  Velvet Bathe, MD [facility] Pager [phone]  If 7PM-7AM, please contact night-coverage [email or web address] Password [email or web address] 12/28/2017, 12:07 PM

## 2017-12-28 NOTE — Progress Notes (Signed)
ANTICOAGULATION CONSULT NOTE - Follow Up Consult  Pharmacy Consult for Lovenox Indication: DVT  Allergies  Allergen Reactions  . Leflunomide Other (See Comments)    diarrhea  . Morphine And Related Nausea And Vomiting  . Plaquenil [Hydroxychloroquine Sulfate]     rash    Patient Measurements: Height: 5\' 7"  (170.2 cm) Weight: 207 lb (93.9 kg) IBW/kg (Calculated) : 66.1  Vital Signs: Temp: 97.8 F (36.6 C) (06/09 0606) Temp Source: Oral (06/09 0606) BP: 126/75 (06/09 0606) Pulse Rate: 49 (06/09 0606)  Labs: Recent Labs    12/25/17 1428 12/26/17 0324 12/27/17 0658  HGB 13.6 13.0 12.5*  HCT 39.9 40.2 37.6*  PLT 108* 107* 111*  CREATININE 0.74 0.59*  --     Estimated Creatinine Clearance: 92.5 mL/min (A) (by C-G formula based on SCr of 0.59 mg/dL (L)).   Medications:  Scheduled:  . ARIPiprazole  30 mg Oral QHS  . aspirin EC  81 mg Oral Daily  . Chlorhexidine Gluconate Cloth  6 each Topical Q0600  . divalproex  1,500 mg Oral QHS  . divalproex  250 mg Oral Q lunch  . enoxaparin (LOVENOX) injection  95 mg Subcutaneous BID  . gabapentin  300 mg Oral Daily  . hydrocortisone sod succinate (SOLU-CORTEF) inj  50 mg Intravenous Q8H  . mupirocin ointment  1 application Nasal BID  . ziprasidone  80 mg Oral QHS   Infusions:  .  ceFAZolin (ANCEF) IV 2 g (12/28/17 0606)    Assessment: 89 yoM admitted on 6/6 with fever, AMS, fall from wheelchair, and cellulitis/bacteremia.  He is on chronic apixaban for hx of DVT.  Pharmacy is consulted to dose Lovenox d/t unreliable PO intake.  6/6 CT: No acute intracranial abnormality.  No evidence of traumatic injury to the chest, abdomen, or pelvis.  PTA apixaban 2.5 mg PO BID.  Last dose on 6/5 at 18:00.  Today, 12/28/2017: CBC: Hgb 12.5, Plt 111 - low/stable near baseline SCr 0.59 (last on 6/7)   Goal of Therapy:  Anti-Xa level 0.6-1 units/ml 4hrs after LMWH dose given Monitor platelets by anticoagulation protocol: Yes    Plan:  Continue Lovenox 95 mg SQ q12h Follow up CBC and SCr q72 hrs while on Lovenox Follow up plans to resume oral anticoagulation.  Gretta Arab PharmD, BCPS Pager [phone] 12/28/2017 9:31 AM

## 2017-12-28 NOTE — Progress Notes (Addendum)
Pt family member brought to the attention that pt bag that contains artifical tear eye drops has another pt name on it. Pt was transferred from ICU and the eye drops (that were unopened at time of transport) were brought up with along with all of his other belongings from the ICU. When RN was scanning medication, there were no warnings. Pt has order for artifical tears. The same bottle of medication was given to pt since 6/8 @ 0700 which was entered from the ICU and ICU were the ones that placed the artifical tears with the wrong patient information in his belongings. Family insists that the eye drops were already in the room before the pt got there. After talking with nursing staff that had pt when he was admitted to our floor on 6/9, staff states that the ICU nurse placed the medications over on the computer.  After further investigation, the pt's name that was on the bag was originally from the ICU where the bag came from. ICU picked up the wrong pt bag with artifical tears and put it with the pt's belongings in his room. Family states they are upset that another pt medication was placed with his medications and that they were being used. Would like to speak with directors about the situation. AC will be called.

## 2017-12-29 ENCOUNTER — Inpatient Hospital Stay: Payer: Self-pay

## 2017-12-29 DIAGNOSIS — L03115 Cellulitis of right lower limb: Secondary | ICD-10-CM

## 2017-12-29 LAB — GLUCOSE, CAPILLARY
GLUCOSE-CAPILLARY: 94 mg/dL (ref 65–99)
GLUCOSE-CAPILLARY: 98 mg/dL (ref 65–99)
Glucose-Capillary: 153 mg/dL — ABNORMAL HIGH (ref 65–99)

## 2017-12-29 MED ORDER — POLYETHYLENE GLYCOL 3350 17 G PO PACK
17.0000 g | PACK | Freq: Every day | ORAL | Status: DC
  Filled 2017-12-29 (×2): qty 1

## 2017-12-29 MED ORDER — APIXABAN 2.5 MG PO TABS
2.5000 mg | ORAL_TABLET | Freq: Two times a day (BID) | ORAL | Status: DC
  Administered 2017-12-29 – 2017-12-31 (×4): 2.5 mg via ORAL
  Filled 2017-12-29 (×4): qty 1

## 2017-12-29 MED ORDER — GABAPENTIN 300 MG PO CAPS
300.0000 mg | ORAL_CAPSULE | Freq: Every day | ORAL | Status: DC
Start: 2017-12-29 — End: 2017-12-31
  Administered 2017-12-29 – 2017-12-30 (×2): 300 mg via ORAL
  Filled 2017-12-29 (×2): qty 1

## 2017-12-29 NOTE — Progress Notes (Signed)
[facility] Infusion Coordinator will follow pt with ID team to support home IV ABX at home if that is DC plan.   If patient discharges after hours, please call [phone].   Larry Sierras 12/29/2017, 9:44 AM

## 2017-12-29 NOTE — Care Management Important Message (Signed)
Important Message  Patient Details  Name: Shaun Yoder MRN: [id number] Date of Birth: June 29, 1946   Medicare Important Message Given:  Yes    Kerin Salen 12/29/2017, 2:48 [place] Message  Patient Details  Name: Shaun Yoder MRN: [id number] Date of Birth: [birth year]   Medicare Important Message Given:  Yes    Kerin Salen 12/29/2017, 2:48 PM

## 2017-12-29 NOTE — Progress Notes (Signed)
Patient ID: Shaun Yoder, male   DOB: 19-Feb-1946, 72 y.o.   MRN: [id number]         [facility]  Date of Admission:  12/25/2017    Total days of antibiotics 5        Day 4 cefazolin         ASSESSMENT: He has MSSA bacteremia complicating right foot cellulitis.  1 of 2 repeat blood cultures on 12/25/2017 were positive again.  2 more sets of blood cultures have been ordered.  I am comfortable going ahead with PICC placement as he is now afebrile and has had 2-1/2 more days of IV antibiotic therapy since the last positive culture.  His TTE did not show any vegetations.  He has severe calcific aortic stenosis.  I talked Mr. Ann Held and his wife about the potential utility of TEE.  He tells me that he has a "short neck" and is very fearful about having any procedures.  He said that he would prefer to simply be treated for the possibility of endocarditis.  PLAN: 1. Continue cefazolin 2. PICC placement  Diagnosis: MSSA bacteremia  Culture Result: MSSA  Allergies  Allergen Reactions  . Leflunomide Other (See Comments)    diarrhea  . Morphine And Related Nausea And Vomiting  . Plaquenil [Hydroxychloroquine Sulfate]     rash    OPAT Orders Discharge antibiotics: Per pharmacy protocol cefazolin  Duration: 6 weeks End Date: 02/05/2018  [facility] Care Per Protocol:  Labs weekly while on IV antibiotics: _x_ CBC with differential _x_ BMP __ CMP __ CRP __ ESR __ Vancomycin trough  _x_ Please pull PIC at completion of IV antibiotics __ Please leave PIC in place until doctor has seen patient or been notified  Fax weekly labs to [phone]  Clinic Follow Up Appt: I will arrange follow-up in my clinic on 02/05/2018  Principal Problem:   Bacteremia due to methicillin susceptible Staphylococcus aureus (MSSA) Active Problems:   Sepsis ([place])   Cellulitis of right leg   Aortic stenosis, severe   Schizoaffective disorder, bipolar type ([place])  Rheumatoid arthritis ([place])   Left leg DVT ([place])   Scheduled Meds: . apixaban  2.5 mg Oral BID  . ARIPiprazole  30 mg Oral QHS  . aspirin EC  81 mg Oral Daily  . Chlorhexidine Gluconate Cloth  6 each Topical Q0600  . divalproex  1,500 mg Oral QHS  . divalproex  250 mg Oral Q lunch  . gabapentin  300 mg Oral QHS  . hydrocortisone sod succinate (SOLU-CORTEF) inj  50 mg Intravenous Q8H  . mirabegron ER  50 mg Oral Daily  . mupirocin ointment  1 application Nasal BID  . ziprasidone  80 mg Oral QHS   Continuous Infusions: .  ceFAZolin (ANCEF) IV Stopped (12/29/17 4166)   PRN Meds:.acetaminophen **OR** acetaminophen, fentaNYL (SUBLIMAZE) injection, ondansetron **OR** ondansetron (ZOFRAN) IV, polyvinyl alcohol   SUBJECTIVE: He is feeling much better.  He does not have much recall about being admitted to the hospital.  Review of Systems: Review of Systems  Unable to perform ROS: Mental acuity    Allergies  Allergen Reactions  . Leflunomide Other (See Comments)    diarrhea  . Morphine And Related Nausea And Vomiting  . Plaquenil [Hydroxychloroquine Sulfate]     rash    OBJECTIVE: Vitals:   12/28/17 0606 12/28/17 1425 12/28/17 2131 12/29/17 0609  BP: 126/75 (!) 117/55 123/83 (!) 151/74  Pulse: (!) 49 (!) 56 (!) 50  65  Resp: 18 (!) _0 Temp: 97.8 F (36.6 C) (!) 97.5 F (36.4 C) 97.8 F (36.6 C) 97.7 F (36.5 C)  TempSrc: Oral Oral Oral Oral  SpO2: 94% 100% 95% 94%  Weight:      Height:       Body mass index is 32.42 kg/m.  Physical Exam  Constitutional:  He is very alert, pleasant and talkative.  His wife is at the bedside.  She frequently has to remind or correct him about recent details.  Cardiovascular: Normal rate and regular rhythm.  Murmur heard. No change in harsh 2/6 systolic murmur  Pulmonary/Chest: Effort normal and breath sounds normal.  Neurological: He is alert.  Skin:  Right foot cellulitis has resolved.    Lab Results Lab Results    Component Value Date   WBC 7.3 12/27/2017   HGB 12.5 (L) 12/27/2017   HCT 37.6 (L) 12/27/2017   MCV 94.7 12/27/2017   PLT 111 (L) 12/27/2017    Lab Results  Component Value Date   CREATININE 0.59 (L) 12/26/2017   BUN 20 12/26/2017   NA 138 12/26/2017   K 4.0 12/26/2017   CL 101 12/26/2017   CO2 27 12/26/2017    Lab Results  Component Value Date   ALT 26 12/26/2017   AST 37 12/26/2017   ALKPHOS 59 12/26/2017   BILITOT 1.0 12/26/2017     Microbiology: Recent Results (from the past 240 hour(s))  Culture, blood (Routine X 2) w Reflex to ID Panel     Status: Abnormal   Collection Time: 12/24/17  9:30 PM  Result Value Ref Range Status   Specimen Description   Final    BLOOD RIGHT HAND Performed at [facility], [address] [place], [place] [address]    Special Requests   Final    BOTTLES DRAWN AEROBIC ONLY Blood Culture results may not be optimal due to an inadequate volume of blood received in culture bottles Performed at [facility], [address] [place], [place] [address]    Culture  Setup Time   Final    GRAM POSITIVE COCCI AEROBIC BOTTLE ONLY CRITICAL RESULT CALLED TO, READ BACK BY AND VERIFIED WITH: Lavell Luster [facility] 2206 12/25/17 A BROWNING    Culture (A)  Final    STAPHYLOCOCCUS AUREUS SUSCEPTIBILITIES PERFORMED ON PREVIOUS CULTURE WITHIN THE LAST 5 DAYS. Performed at [facility], [place] [address] [place], [place] [address]    Report Status 12/27/2017 FINAL  Final  Culture, blood (Routine X 2) w Reflex to ID Panel     Status: Abnormal   Collection Time: 12/24/17 10:15 PM  Result Value Ref Range Status   Specimen Description   Final    BLOOD RIGHT ANTECUBITAL Performed at [facility], [address] [place], [place] [address]    Special Requests   Final    BOTTLES DRAWN AEROBIC AND ANAEROBIC Blood Culture adequate volume Performed at [facility], [address] [place], [place] [address]     Culture  Setup Time   Final    GRAM POSITIVE COCCI IN BOTH AEROBIC AND ANAEROBIC BOTTLES CRITICAL RESULT CALLED TO, READ BACK BY AND VERIFIED WITHLavell Luster [facility] 2206 12/25/17 A BROWNING Performed at [facility], [place] [address] [place], [place] [address]    Culture STAPHYLOCOCCUS AUREUS (A)  Final   Report Status 12/27/2017 FINAL  Final   Organism  ID, Bacteria STAPHYLOCOCCUS AUREUS  Final      Susceptibility   Staphylococcus aureus - MIC*    CIPROFLOXACIN <=0.5 SENSITIVE Sensitive     ERYTHROMYCIN <=0.25 SENSITIVE Sensitive     GENTAMICIN <=0.5 SENSITIVE Sensitive     OXACILLIN 0.5 SENSITIVE Sensitive     TETRACYCLINE <=1 SENSITIVE Sensitive     VANCOMYCIN 1 SENSITIVE Sensitive     TRIMETH/SULFA <=10 SENSITIVE Sensitive     CLINDAMYCIN <=0.25 SENSITIVE Sensitive     RIFAMPIN <=0.5 SENSITIVE Sensitive     Inducible Clindamycin NEGATIVE Sensitive     * STAPHYLOCOCCUS AUREUS  Blood Culture ID Panel (Reflexed)     Status: Abnormal   Collection Time: 12/24/17 10:15 PM  Result Value Ref Range Status   Enterococcus species NOT DETECTED NOT DETECTED Final   Listeria monocytogenes NOT DETECTED NOT DETECTED Final   Staphylococcus species DETECTED (A) NOT DETECTED Final    Comment: CRITICAL RESULT CALLED TO, READ BACK BY AND VERIFIED WITH: Lavell Luster [facility] 2206 12/25/17 A BROWNING    Staphylococcus aureus DETECTED (A) NOT DETECTED Final    Comment: Methicillin (oxacillin) susceptible Staphylococcus aureus (MSSA). Preferred therapy is anti staphylococcal beta lactam antibiotic (Cefazolin or Nafcillin), unless clinically contraindicated. CRITICAL RESULT CALLED TO, READ BACK BY AND VERIFIED WITH: Lavell Luster [facility] 2206 12/25/17 A BROWNING    Methicillin resistance NOT DETECTED NOT DETECTED Final   Streptococcus species NOT DETECTED NOT DETECTED Final   Streptococcus agalactiae NOT DETECTED NOT DETECTED Final   Streptococcus pneumoniae NOT DETECTED NOT DETECTED Final   Streptococcus  pyogenes NOT DETECTED NOT DETECTED Final   Acinetobacter baumannii NOT DETECTED NOT DETECTED Final   Enterobacteriaceae species NOT DETECTED NOT DETECTED Final   Enterobacter cloacae complex NOT DETECTED NOT DETECTED Final   Escherichia coli NOT DETECTED NOT DETECTED Final   Klebsiella oxytoca NOT DETECTED NOT DETECTED Final   Klebsiella pneumoniae NOT DETECTED NOT DETECTED Final   Proteus species NOT DETECTED NOT DETECTED Final   Serratia marcescens NOT DETECTED NOT DETECTED Final   Haemophilus influenzae NOT DETECTED NOT DETECTED Final   Neisseria meningitidis NOT DETECTED NOT DETECTED Final   Pseudomonas aeruginosa NOT DETECTED NOT DETECTED Final   Candida albicans NOT DETECTED NOT DETECTED Final   Candida glabrata NOT DETECTED NOT DETECTED Final   Candida krusei NOT DETECTED NOT DETECTED Final   Candida parapsilosis NOT DETECTED NOT DETECTED Final   Candida tropicalis NOT DETECTED NOT DETECTED Final    Comment: Performed at [facility], [address], [place], [place] [address]  Blood culture (routine x 2)     Status: None (Preliminary result)   Collection Time: 12/25/17  2:36 PM  Result Value Ref Range Status   Specimen Description   Final    BLOOD LEFT ARM Performed at [facility], [address] [place], [place] [address]    Special Requests   Final    BOTTLES DRAWN AEROBIC AND ANAEROBIC Blood Culture adequate volume Performed at [facility], [address] [place], [place] [address]    Culture  Setup Time   Final    GRAM POSITIVE COCCI IN CLUSTERS AEROBIC BOTTLE ONLY CRITICAL RESULT CALLED TO, READ BACK BY AND VERIFIED WITH: E WILLIAMSON,PHARMD AT 7124 12/29/17 BY L BENFIELD Performed at [facility], [place] [address] [place], [place] [address]    Culture GRAM POSITIVE COCCI  Final   Report Status PENDING  Incomplete  Blood culture (routine x 2)  Status: None (Preliminary result)   Collection Time: 12/25/17  2:41 PM  Result Value  Ref Range Status   Specimen Description   Final    BLOOD RIGHT HAND Performed at [facility], [address] [place], [place] [address]    Special Requests   Final    BOTTLES DRAWN AEROBIC AND ANAEROBIC Blood Culture adequate volume Performed at [facility], [address] [place], [place] [address]    Culture   Final    NO GROWTH 3 DAYS Performed at [place], [place] [address], [place], [place] [address]    Report Status PENDING  Incomplete  MRSA PCR Screening     Status: Abnormal   Collection Time: 12/25/17 11:31 PM  Result Value Ref Range Status   MRSA by PCR POSITIVE (A) NEGATIVE Final    Comment:        The GeneXpert MRSA Assay (FDA approved for NASAL specimens only), is one component of a comprehensive MRSA colonization surveillance program. It is not intended to diagnose MRSA infection nor to guide or monitor treatment for MRSA infections. RESULT CALLED TO, READ BACK BY AND VERIFIED WITH: A SAWYERS,RN _0  12/26/17 MKELLY Performed at [facility], [place] [address], [place], [place] [address]     Michel Bickers, [place] [facility] [place] [facility] [phone] pager   [phone] cell 12/29/2017, 10:57 AM

## 2017-12-29 NOTE — Progress Notes (Signed)
PROGRESS NOTE    Shaun Yoder  MWU:[id number] DOB: 1946/03/23 DOA: 12/25/2017 PCP: Colon Branch, MD    Brief Narrative:  72 y.o. male with history of rheumatoid arthritis, severe aortic stenosis, thrombocytopenia, DVT on apixaban presents to the ER because of increasing confusion and fever.  Pt diagnosed with sepsis secondary to cellulitis. Dx with Staph Aureus bacteremia.   Assessment & Plan:   Principal Problem:   Bacteremia due to methicillin susceptible Staphylococcus aureus (MSSA)/Sepsis (HCC)/Cellulitis of right leg - ID consulted and assisting with case. - Continue Cefazolin. Last blood culture positive will obtain another set of blood cultures.  - TTE reporting EF of 60-65% with grade 1 diastolic dysfunction  Active Problems:   Aortic stenosis, severe - stable    Schizoaffective disorder, bipolar type (HCC) - stable    Rheumatoid arthritis ([place]) - stable    Left leg DVT ([name]) - Pt is on Lovenox  DVT prophylaxis: Lovenox Code Status: Full Family Communication: None at bedside.  Disposition Plan: Pending improvement in condition  Consultants:   ID   Procedures: none   Antimicrobials: Ancef   Subjective: No acute issues overnight.   Objective: Vitals:   12/28/17 1425 12/28/17 2131 12/29/17 0609 12/29/17 1215  BP: (!) 117/55 123/83 (!) 151/74 114/69  Pulse: (!) 56 (!) 50 65 72  Resp: (!) 28 20 18  (!) 26  Temp: (!) 97.5 F (36.4 C) 97.8 F (36.6 C) 97.7 F (36.5 C) 98 F (36.7 C)  TempSrc: Oral Oral Oral Oral  SpO2: 100% 95% 94% 93%  Weight:      Height:        Intake/Output Summary (Last 24 hours) at 12/29/2017 1251 Last data filed at 12/29/2017 0836 Gross per 24 hour  Intake 590 ml  Output 1300 ml  Net -710 ml   Filed Weights   12/25/17 1421  Weight: 93.9 kg (207 lb)    Examination: exam unchanged when compared to prior on 12/28/17  General exam: Pt in nad, Appears calm and comfortable Respiratory system: Clear to auscultation.  Respiratory effort normal. Equal chest rise.  Cardiovascular system: S1 & S2 heard, RRR. No JVD, murmurs, rubs, gallops  Gastrointestinal system: Abdomen is nondistended, soft and nontender. No organomegaly or masses felt. Normal bowel sounds heard. Central nervous system: Alert and oriented. No focal neurological deficits. Extremities: warm, no cyanosis Skin: RLE cellulitis, no lesions or ulcers, on limited exam. Psychiatry:  Mood & affect appropriate.   Data Reviewed: I have personally reviewed following labs and imaging studies  CBC: Recent Labs  Lab 12/24/17 2031 12/25/17 1428 12/26/17 0324 12/27/17 0658  WBC 12.0* 10.7* 10.2 7.3  NEUTROABS 10.5* 9.0* 9.0*  --   HGB 14.3 13.6 13.0 12.5*  HCT 42.2 39.9 40.2 37.6*  MCV 94.4 93.7 95.0 94.7  PLT 125* 108* 107* 725*   Basic Metabolic Panel: Recent Labs  Lab 12/24/17 2031 12/25/17 1428 12/26/17 0324  NA 137 135 138  K 4.3 4.1 4.0  CL 100* 99* 101  CO2 30 27 27   GLUCOSE 111* 101* 108*  BUN 25* 23* 20  CREATININE 0.70 0.74 0.59*  CALCIUM 9.0 8.7* 8.4*   GFR: Estimated Creatinine Clearance: 92.5 mL/min (A) (by C-G formula based on SCr of 0.59 mg/dL (L)). Liver Function Tests: Recent Labs  Lab 12/25/17 1428 12/26/17 0324  AST 31 37  ALT 17 26  ALKPHOS 64 59  BILITOT 1.1 1.0  PROT 7.3 6.8  ALBUMIN 3.2* 2.9*   No results for  input(s): LIPASE, AMYLASE in the last 168 hours. Recent Labs  Lab 12/26/17 0025  AMMONIA 45*   Coagulation Profile: No results for input(s): INR, PROTIME in the last 168 hours. Cardiac Enzymes: No results for input(s): CKTOTAL, CKMB, CKMBINDEX, TROPONINI in the last 168 hours. BNP (last 3 results) No results for input(s): PROBNP in the last 8760 hours. HbA1C: No results for input(s): HGBA1C in the last 72 hours. CBG: Recent Labs  Lab 12/28/17 1127 12/28/17 1832 12/28/17 2344 12/29/17 0605 12/29/17 1208  GLUCAP 207* 97 100* 94 153*   Lipid Profile: No results for input(s):  CHOL, HDL, LDLCALC, TRIG, CHOLHDL, LDLDIRECT in the last 72 hours. Thyroid Function Tests: No results for input(s): TSH, T4TOTAL, FREET4, T3FREE, THYROIDAB in the last 72 hours. Anemia Panel: No results for input(s): VITAMINB12, FOLATE, FERRITIN, TIBC, IRON, RETICCTPCT in the last 72 hours. Sepsis Labs: Recent Labs  Lab 12/25/17 1430 12/26/17 0324  PROCALCITON  --  <0.10  LATICACIDVEN 0.81 0.9    Recent Results (from the past 240 hour(s))  Culture, blood (Routine X 2) w Reflex to ID Panel     Status: Abnormal   Collection Time: 12/24/17  9:30 PM  Result Value Ref Range Status   Specimen Description   Final    BLOOD RIGHT HAND Performed at [facility], [address] [place], [place] [address]    Special Requests   Final    BOTTLES DRAWN AEROBIC ONLY Blood Culture results may not be optimal due to an inadequate volume of blood received in culture bottles Performed at [facility], [address] [place], [place] [address]    Culture  Setup Time   Final    GRAM POSITIVE COCCI AEROBIC BOTTLE ONLY CRITICAL RESULT CALLED TO, READ BACK BY AND VERIFIED WITH: Lavell Luster [facility] 2206 12/25/17 A BROWNING    Culture (A)  Final    STAPHYLOCOCCUS AUREUS SUSCEPTIBILITIES PERFORMED ON PREVIOUS CULTURE WITHIN THE LAST 5 DAYS. Performed at [facility], [place] [address] [place], [place] [address]    Report Status 12/27/2017 FINAL  Final  Culture, blood (Routine X 2) w Reflex to ID Panel     Status: Abnormal   Collection Time: 12/24/17 10:15 PM  Result Value Ref Range Status   Specimen Description   Final    BLOOD RIGHT ANTECUBITAL Performed at [facility], [address] [place], [place] [address]    Special Requests   Final    BOTTLES DRAWN AEROBIC AND ANAEROBIC Blood Culture adequate volume Performed at [facility], [address] [place], [place] [address]    Culture  Setup Time   Final    GRAM POSITIVE COCCI IN BOTH AEROBIC AND  ANAEROBIC BOTTLES CRITICAL RESULT CALLED TO, READ BACK BY AND VERIFIED WITHLavell Luster [facility] 2206 12/25/17 A BROWNING Performed at [facility], [place] [address], [place], [place] [address]    Culture STAPHYLOCOCCUS AUREUS (A)  Final   Report Status 12/27/2017 FINAL  Final   Organism ID, Bacteria STAPHYLOCOCCUS AUREUS  Final      Susceptibility   Staphylococcus aureus - MIC*    CIPROFLOXACIN <=0.5 SENSITIVE Sensitive     ERYTHROMYCIN <=0.25 SENSITIVE Sensitive     GENTAMICIN <=0.5 SENSITIVE Sensitive     OXACILLIN 0.5 SENSITIVE Sensitive     TETRACYCLINE <=1 SENSITIVE Sensitive     VANCOMYCIN 1 SENSITIVE Sensitive     TRIMETH/SULFA <=10 SENSITIVE Sensitive  CLINDAMYCIN <=0.25 SENSITIVE Sensitive     RIFAMPIN <=0.5 SENSITIVE Sensitive     Inducible Clindamycin NEGATIVE Sensitive     * STAPHYLOCOCCUS AUREUS  Blood Culture ID Panel (Reflexed)     Status: Abnormal   Collection Time: 12/24/17 10:15 PM  Result Value Ref Range Status   Enterococcus species NOT DETECTED NOT DETECTED Final   Listeria monocytogenes NOT DETECTED NOT DETECTED Final   Staphylococcus species DETECTED (A) NOT DETECTED Final    Comment: CRITICAL RESULT CALLED TO, READ BACK BY AND VERIFIED WITH: Lavell Luster [facility] 2206 12/25/17 A BROWNING    Staphylococcus aureus DETECTED (A) NOT DETECTED Final    Comment: Methicillin (oxacillin) susceptible Staphylococcus aureus (MSSA). Preferred therapy is anti staphylococcal beta lactam antibiotic (Cefazolin or Nafcillin), unless clinically contraindicated. CRITICAL RESULT CALLED TO, READ BACK BY AND VERIFIED WITH: Lavell Luster [facility] 2206 12/25/17 A BROWNING    Methicillin resistance NOT DETECTED NOT DETECTED Final   Streptococcus species NOT DETECTED NOT DETECTED Final   Streptococcus agalactiae NOT DETECTED NOT DETECTED Final   Streptococcus pneumoniae NOT DETECTED NOT DETECTED Final   Streptococcus pyogenes NOT DETECTED NOT DETECTED Final   Acinetobacter baumannii NOT  DETECTED NOT DETECTED Final   Enterobacteriaceae species NOT DETECTED NOT DETECTED Final   Enterobacter cloacae complex NOT DETECTED NOT DETECTED Final   Escherichia coli NOT DETECTED NOT DETECTED Final   Klebsiella oxytoca NOT DETECTED NOT DETECTED Final   Klebsiella pneumoniae NOT DETECTED NOT DETECTED Final   Proteus species NOT DETECTED NOT DETECTED Final   Serratia marcescens NOT DETECTED NOT DETECTED Final   Haemophilus influenzae NOT DETECTED NOT DETECTED Final   Neisseria meningitidis NOT DETECTED NOT DETECTED Final   Pseudomonas aeruginosa NOT DETECTED NOT DETECTED Final   Candida albicans NOT DETECTED NOT DETECTED Final   Candida glabrata NOT DETECTED NOT DETECTED Final   Candida krusei NOT DETECTED NOT DETECTED Final   Candida parapsilosis NOT DETECTED NOT DETECTED Final   Candida tropicalis NOT DETECTED NOT DETECTED Final    Comment: Performed at [facility], [address], [place], [place] [address]  Blood culture (routine x 2)     Status: None (Preliminary result)   Collection Time: 12/25/17  2:36 PM  Result Value Ref Range Status   Specimen Description   Final    BLOOD LEFT ARM Performed at [facility], [address] [place], [place] [address]    Special Requests   Final    BOTTLES DRAWN AEROBIC AND ANAEROBIC Blood Culture adequate volume Performed at [facility], [address] [place], [place] [address]    Culture  Setup Time   Final    GRAM POSITIVE COCCI IN CLUSTERS AEROBIC BOTTLE ONLY CRITICAL RESULT CALLED TO, READ BACK BY AND VERIFIED WITH: E WILLIAMSON,PHARMD AT 0263 12/29/17 BY L BENFIELD Performed at [facility], [place] [address] [place], [place] [address]    Culture GRAM POSITIVE COCCI  Final   Report Status PENDING  Incomplete  Blood culture (routine x 2)     Status: None (Preliminary result)   Collection Time: 12/25/17  2:41 PM  Result Value Ref Range Status   Specimen Description   Final    BLOOD RIGHT  HAND Performed at [facility], [address] [place], [place] [address]    Special Requests   Final    BOTTLES DRAWN AEROBIC AND ANAEROBIC Blood Culture adequate volume Performed at [facility], [address] [place], [place] [address]    Culture   Final    NO GROWTH 4 DAYS Performed at [place] [address], [place], [place] [address]    Report Status PENDING  Incomplete  MRSA PCR Screening     Status: Abnormal   Collection Time: 12/25/17 11:31 PM  Result Value Ref Range Status   MRSA by PCR POSITIVE (A) NEGATIVE Final    Comment:        The GeneXpert MRSA Assay (FDA approved for NASAL specimens only), is one component of a comprehensive MRSA colonization surveillance program. It is not intended to diagnose MRSA infection nor to guide or monitor treatment for MRSA infections. RESULT CALLED TO, READ BACK BY AND VERIFIED WITH: A SAWYERS,RN @0554  12/26/17 MKELLY Performed at [facility], [place] [address], [place], [place] [address]      Radiology Studies: [place] Ekg Site Rite  Result Date: 12/29/2017 If [facility] image not attached, placement could not be confirmed due to current cardiac rhythm.   Scheduled Meds: . apixaban  2.5 mg Oral BID  . ARIPiprazole  30 mg Oral QHS  . aspirin EC  81 mg Oral Daily  . Chlorhexidine Gluconate Cloth  6 each Topical Q0600  . divalproex  1,500 mg Oral QHS  . divalproex  250 mg Oral Q lunch  . gabapentin  300 mg Oral QHS  . hydrocortisone sod succinate (SOLU-CORTEF) inj  50 mg Intravenous Q8H  . mirabegron ER  50 mg Oral Daily  . mupirocin ointment  1 application Nasal BID  . ziprasidone  80 mg Oral QHS   Continuous Infusions: .  ceFAZolin (ANCEF) IV Stopped (12/29/17 0037)     LOS: 4 days    Time spent: 20 min  Velvet Bathe, MD [facility] Pager [phone]  If 7PM-7AM, please contact night-coverage [email or web address] Password [email or web address] 12/29/2017, 12:51 PM

## 2017-12-29 NOTE — Progress Notes (Signed)
PHARMACY - PHYSICIAN COMMUNICATION CRITICAL VALUE ALERT - BLOOD CULTURE IDENTIFICATION (BCID)  Shaun Yoder is an 72 y.o. male who presented to [facility] on 12/25/2017 with a chief complaint of confusion and fever  Assessment:  Bacteremia 2nd MSSA 2nd R leg cellulitis 6/10 called by lab for 6/6 1 of 4 bottles with SA - will not run BCID because already done on 6/5 - likely MSSA   Name of physician (or Provider) Contacted: Wendee Beavers via [place] text  Current antibiotics: cefazolin 2 gm IV q8  Changes to prescribed antibiotics recommended:  Patient is on recommended antibiotics - No changes needed  Results for orders placed or performed during the hospital encounter of 12/24/17  Blood Culture ID Panel (Reflexed) (Collected: 12/24/2017 10:15 PM)  Result Value Ref Range   Enterococcus species NOT DETECTED NOT DETECTED   Listeria monocytogenes NOT DETECTED NOT DETECTED   Staphylococcus species DETECTED (A) NOT DETECTED   Staphylococcus aureus DETECTED (A) NOT DETECTED   Methicillin resistance NOT DETECTED NOT DETECTED   Streptococcus species NOT DETECTED NOT DETECTED   Streptococcus agalactiae NOT DETECTED NOT DETECTED   Streptococcus pneumoniae NOT DETECTED NOT DETECTED   Streptococcus pyogenes NOT DETECTED NOT DETECTED   Acinetobacter baumannii NOT DETECTED NOT DETECTED   Enterobacteriaceae species NOT DETECTED NOT DETECTED   Enterobacter cloacae complex NOT DETECTED NOT DETECTED   Escherichia coli NOT DETECTED NOT DETECTED   Klebsiella oxytoca NOT DETECTED NOT DETECTED   Klebsiella pneumoniae NOT DETECTED NOT DETECTED   Proteus species NOT DETECTED NOT DETECTED   Serratia marcescens NOT DETECTED NOT DETECTED   Haemophilus influenzae NOT DETECTED NOT DETECTED   Neisseria meningitidis NOT DETECTED NOT DETECTED   Pseudomonas aeruginosa NOT DETECTED NOT DETECTED   Candida albicans NOT DETECTED NOT DETECTED   Candida glabrata NOT DETECTED NOT DETECTED   Candida krusei NOT DETECTED NOT  DETECTED   Candida parapsilosis NOT DETECTED NOT DETECTED   Candida tropicalis NOT DETECTED NOT DETECTED    Eudelia Bunch, Pharm.D. [phone] 12/29/2017 10:03 AM

## 2017-12-29 NOTE — Progress Notes (Signed)
PHARMACY CONSULT NOTE FOR:  OUTPATIENT  PARENTERAL ANTIBIOTIC THERAPY (OPAT)  Indication: MSSA bacteremia Regimen: cefazolin 2 gm IV q8h End date: 02/05/18  IV antibiotic discharge orders are pended. To discharging provider:  please sign these orders via discharge navigator,  Select New Orders & click on the button choice - Manage This Unsigned Work.     Thank you for allowing pharmacy to be a part of this patient's care.  Eudelia Bunch, Pharm.D. [phone] 12/29/2017 11:12 AM

## 2017-12-30 LAB — CULTURE, BLOOD (ROUTINE X 2)
Culture: NO GROWTH
SPECIAL REQUESTS: ADEQUATE
SPECIAL REQUESTS: ADEQUATE

## 2017-12-30 LAB — GLUCOSE, CAPILLARY
GLUCOSE-CAPILLARY: 110 mg/dL — AB (ref 65–99)
GLUCOSE-CAPILLARY: 83 mg/dL (ref 65–99)
Glucose-Capillary: 105 mg/dL — ABNORMAL HIGH (ref 65–99)
Glucose-Capillary: 112 mg/dL — ABNORMAL HIGH (ref 65–99)

## 2017-12-30 MED ORDER — SODIUM CHLORIDE 0.9% FLUSH: 10.0000 mL | INTRAVENOUS | Status: DC | PRN

## 2017-12-30 MED ORDER — SODIUM CHLORIDE 0.9% FLUSH
10.0000 mL | Freq: Two times a day (BID) | INTRAVENOUS | Status: DC
  Administered 2017-12-30 – 2017-12-31 (×2): 10 mL

## 2017-12-30 MED ORDER — SODIUM CHLORIDE 0.9 % IV SOLN
INTRAVENOUS | Status: DC
  Administered 2017-12-30: 14:00:00 via INTRAVENOUS

## 2017-12-30 NOTE — Care Management Note (Signed)
Case Management Note  Patient Details  Name: Shaun Yoder MRN: [id number] Date of Birth: 1946/05/31  Subjective/Objective:Spoke to patient/spouse Shaun Yoder in rm about d/c plans-Patient declines SNF;Wants HHC-[facility] chosen for HHRN-long term iv abx-HHRN-instruction, labs per protocal. Patient has PICC. Spouse will be the primary caregiver for instruciton-Shaun Yoder c#[phone]-[facility] rep [name] for iv infusion notified. [facility]-HHRN/PT/OT/aide,CSW-rep [name] aware of d/c in am. Patient already has private duty aide-[employer] benefit. Patient will get bedside commode on own. Ortho tech will put on L arm shoulder sling.                     Action/Plan:d/c home w/HHC/iv abx.   Expected Discharge Date:                  Expected Discharge Plan:  [place]  In-House Referral:     Discharge planning Services  CM Consult  Post Acute Care Choice:  Durable Medical Equipment(w/c,rollator,ramps,handicapped vehicle,private duty aide) Choice offered to:  Patient  DME Arranged:    DME Agency:     HH Arranged:  RN, PT, OT, IV Antibiotics, Nurse's Aide, Social Work, Refused SNF [place] Agency:  [place]  Status of Service:  Completed, signed off  If discussed at [employer], dates discussed:    Additional Comments:  [name], RN 12/30/2017, 1:02 PM

## 2017-12-30 NOTE — Progress Notes (Signed)
PROGRESS NOTE    Shaun Yoder  HKV:[id number] DOB: 1946-02-04 DOA: 12/25/2017 PCP: Colon Branch, MD    Brief Narrative:  72 y.o. male with history of rheumatoid arthritis, severe aortic stenosis, thrombocytopenia, DVT on apixaban presents to the ER because of increasing confusion and fever.  Pt diagnosed with sepsis secondary to cellulitis. Dx with Staph Aureus bacteremia. Plan is for picc line and 6 wks of antibiotics  Assessment & Plan:   Principal Problem:   Bacteremia due to methicillin susceptible Staphylococcus aureus (MSSA)/Sepsis (HCC)/Cellulitis of right leg - ID consulted and assisting with case. - Continue Cefazolin. Last blood culture positive will obtain another set of blood cultures.  - TTE reporting EF of 60-65% with grade 1 diastolic dysfunction - Patient refuses TEE and as such prefers to be treated for 6 weeks with antibiotics.  Patient has OPAT orders and plan will be for weekly labs CBC and BMP to be sent to infectious disease doctor for further monitoring.  Active Problems:   Aortic stenosis, severe - Stable    Schizoaffective disorder, bipolar type (HCC) - Stable    Rheumatoid arthritis ([place]) - Stable    Left leg DVT (HCC) - Pt is on Lovenox  DVT prophylaxis: Lovenox Code Status: Full Family Communication: None at bedside.  Disposition Plan: Today have discussed with patient, spouse, case manager what discharge plans will be.  Patient will need home health nursing to assist with medication administration.  Today we will do discharge planning with plans to discharge tomorrow morning.  Consultants:   ID   Procedures: none   Antimicrobials: Ancef   Subjective: Pt had many questions answered to his satisfaction as well as his wife.  Plan is for home health nursing to come out and to assist patient with vacation administration.  This will need to be set up.  We will have case manager discussed with family and patient.  Objective: Vitals:   12/29/17 2058 12/29/17 2253 12/30/17 0636 12/30/17 1235  BP: (!) 144/88 (!) 149/74 106/64 134/80  Pulse: 64 (!) 51 64 64  Resp: 18  20 (!) 21  Temp: 98.9 F (37.2 C)  97.7 F (36.5 C) 97.9 F (36.6 C)  TempSrc:    Oral  SpO2: 95%  92% 95%  Weight:      Height:        Intake/Output Summary (Last 24 hours) at 12/30/2017 1621 Last data filed at 12/30/2017 1424 Gross per 24 hour  Intake 541.67 ml  Output 1050 ml  Net -508.33 ml   Filed Weights   12/25/17 1421  Weight: 93.9 kg (207 lb)    Examination: exam unchanged when compared to prior on 12/29/17  General exam: Pt in nad, Appears calm and comfortable Respiratory system: Clear to auscultation. Respiratory effort normal. Equal chest rise.  Cardiovascular system: S1 & S2 heard, RRR. No JVD, murmurs, rubs, gallops  Gastrointestinal system: Abdomen is nondistended, soft and nontender. No organomegaly or masses felt. Normal bowel sounds heard. Central nervous system: Alert and oriented. No focal neurological deficits. Extremities: warm, no cyanosis Skin: RLE cellulitis, no lesions or ulcers, on limited exam. Psychiatry:  Mood & affect appropriate.   Data Reviewed: I have personally reviewed following labs and imaging studies  CBC: Recent Labs  Lab 12/24/17 2031 12/25/17 1428 12/26/17 0324 12/27/17 0658  WBC 12.0* 10.7* 10.2 7.3  NEUTROABS 10.5* 9.0* 9.0*  --   HGB 14.3 13.6 13.0 12.5*  HCT 42.2 39.9 40.2 37.6*  MCV 94.4 93.7 95.0  94.7  PLT 125* 108* 107* 952*   Basic Metabolic Panel: Recent Labs  Lab 12/24/17 2031 12/25/17 1428 12/26/17 0324  NA 137 135 138  K 4.3 4.1 4.0  CL 100* 99* 101  CO2 30 27 27   GLUCOSE 111* 101* 108*  BUN 25* 23* 20  CREATININE 0.70 0.74 0.59*  CALCIUM 9.0 8.7* 8.4*   GFR: Estimated Creatinine Clearance: 92.5 mL/min (A) (by C-G formula based on SCr of 0.59 mg/dL (L)). Liver Function Tests: Recent Labs  Lab 12/25/17 1428 12/26/17 0324  AST 31 37  ALT 17 26  ALKPHOS 64 59    BILITOT 1.1 1.0  PROT 7.3 6.8  ALBUMIN 3.2* 2.9*   No results for input(s): LIPASE, AMYLASE in the last 168 hours. Recent Labs  Lab 12/26/17 0025  AMMONIA 45*   Coagulation Profile: No results for input(s): INR, PROTIME in the last 168 hours. Cardiac Enzymes: No results for input(s): CKTOTAL, CKMB, CKMBINDEX, TROPONINI in the last 168 hours. BNP (last 3 results) No results for input(s): PROBNP in the last 8760 hours. HbA1C: No results for input(s): HGBA1C in the last 72 hours. CBG: Recent Labs  Lab 12/29/17 1208 12/29/17 1749 12/30/17 0007 12/30/17 0632 12/30/17 1224  GLUCAP 153* 98 110* 83 105*   Lipid Profile: No results for input(s): CHOL, HDL, LDLCALC, TRIG, CHOLHDL, LDLDIRECT in the last 72 hours. Thyroid Function Tests: No results for input(s): TSH, T4TOTAL, FREET4, T3FREE, THYROIDAB in the last 72 hours. Anemia Panel: No results for input(s): VITAMINB12, FOLATE, FERRITIN, TIBC, IRON, RETICCTPCT in the last 72 hours. Sepsis Labs: Recent Labs  Lab 12/25/17 1430 12/26/17 0324  PROCALCITON  --  <0.10  LATICACIDVEN 0.81 0.9    Recent Results (from the past 240 hour(s))  Culture, blood (Routine X 2) w Reflex to ID Panel     Status: Abnormal   Collection Time: 12/24/17  9:30 PM  Result Value Ref Range Status   Specimen Description   Final    BLOOD RIGHT HAND Performed at [facility], [address] [place], [place] [address]    Special Requests   Final    BOTTLES DRAWN AEROBIC ONLY Blood Culture results may not be optimal due to an inadequate volume of blood received in culture bottles Performed at [facility] [address] [place], [place] [address]    Culture  Setup Time   Final    GRAM POSITIVE COCCI AEROBIC BOTTLE ONLY CRITICAL RESULT CALLED TO, READ BACK BY AND VERIFIED WITH: Lavell Luster [facility] 2206 12/25/17 A BROWNING    Culture (A)  Final    STAPHYLOCOCCUS AUREUS SUSCEPTIBILITIES PERFORMED ON PREVIOUS CULTURE WITHIN THE  LAST 5 DAYS. Performed at [facility], [place] [address] [place], [place] [address]    Report Status 12/27/2017 FINAL  Final  Culture, blood (Routine X 2) w Reflex to ID Panel     Status: Abnormal   Collection Time: 12/24/17 10:15 PM  Result Value Ref Range Status   Specimen Description   Final    BLOOD RIGHT ANTECUBITAL Performed at [facility], [address] [place], [place] [address]    Special Requests   Final    BOTTLES DRAWN AEROBIC AND ANAEROBIC Blood Culture adequate volume Performed at [facility], [address] [place], [place] [address]    Culture  Setup Time   Final    GRAM POSITIVE COCCI IN BOTH AEROBIC AND ANAEROBIC BOTTLES CRITICAL  RESULT CALLED TO, READ BACK BY AND VERIFIED WITHLavell Luster [facility] 2206 12/25/17 A BROWNING Performed at [facility], [place] [address], [place], [place] [address]    Culture STAPHYLOCOCCUS AUREUS (A)  Final   Report Status 12/27/2017 FINAL  Final   Organism ID, Bacteria STAPHYLOCOCCUS AUREUS  Final      Susceptibility   Staphylococcus aureus - MIC*    CIPROFLOXACIN <=0.5 SENSITIVE Sensitive     ERYTHROMYCIN <=0.25 SENSITIVE Sensitive     GENTAMICIN <=0.5 SENSITIVE Sensitive     OXACILLIN 0.5 SENSITIVE Sensitive     TETRACYCLINE <=1 SENSITIVE Sensitive     VANCOMYCIN 1 SENSITIVE Sensitive     TRIMETH/SULFA <=10 SENSITIVE Sensitive     CLINDAMYCIN <=0.25 SENSITIVE Sensitive     RIFAMPIN <=0.5 SENSITIVE Sensitive     Inducible Clindamycin NEGATIVE Sensitive     * STAPHYLOCOCCUS AUREUS  Blood Culture ID Panel (Reflexed)     Status: Abnormal   Collection Time: 12/24/17 10:15 PM  Result Value Ref Range Status   Enterococcus species NOT DETECTED NOT DETECTED Final   Listeria monocytogenes NOT DETECTED NOT DETECTED Final   Staphylococcus species DETECTED (A) NOT DETECTED Final    Comment: CRITICAL RESULT CALLED TO, READ BACK BY AND VERIFIED WITH: Lavell Luster [facility] 2206 12/25/17 A BROWNING     Staphylococcus aureus DETECTED (A) NOT DETECTED Final    Comment: Methicillin (oxacillin) susceptible Staphylococcus aureus (MSSA). Preferred therapy is anti staphylococcal beta lactam antibiotic (Cefazolin or Nafcillin), unless clinically contraindicated. CRITICAL RESULT CALLED TO, READ BACK BY AND VERIFIED WITH: Lavell Luster [facility] 2206 12/25/17 A BROWNING    Methicillin resistance NOT DETECTED NOT DETECTED Final   Streptococcus species NOT DETECTED NOT DETECTED Final   Streptococcus agalactiae NOT DETECTED NOT DETECTED Final   Streptococcus pneumoniae NOT DETECTED NOT DETECTED Final   Streptococcus pyogenes NOT DETECTED NOT DETECTED Final   Acinetobacter baumannii NOT DETECTED NOT DETECTED Final   Enterobacteriaceae species NOT DETECTED NOT DETECTED Final   Enterobacter cloacae complex NOT DETECTED NOT DETECTED Final   Escherichia coli NOT DETECTED NOT DETECTED Final   Klebsiella oxytoca NOT DETECTED NOT DETECTED Final   Klebsiella pneumoniae NOT DETECTED NOT DETECTED Final   Proteus species NOT DETECTED NOT DETECTED Final   Serratia marcescens NOT DETECTED NOT DETECTED Final   Haemophilus influenzae NOT DETECTED NOT DETECTED Final   Neisseria meningitidis NOT DETECTED NOT DETECTED Final   Pseudomonas aeruginosa NOT DETECTED NOT DETECTED Final   Candida albicans NOT DETECTED NOT DETECTED Final   Candida glabrata NOT DETECTED NOT DETECTED Final   Candida krusei NOT DETECTED NOT DETECTED Final   Candida parapsilosis NOT DETECTED NOT DETECTED Final   Candida tropicalis NOT DETECTED NOT DETECTED Final    Comment: Performed at [facility], [address], [place], [place] [address]  Blood culture (routine x 2)     Status: Abnormal   Collection Time: 12/25/17  2:36 PM  Result Value Ref Range Status   Specimen Description   Final    BLOOD LEFT ARM Performed at [facility], [address] [place], [place] [address]    Special Requests   Final    BOTTLES DRAWN AEROBIC AND  ANAEROBIC Blood Culture adequate volume Performed at [facility], [address] [place], [place] [address]    Culture  Setup Time   Final    GRAM POSITIVE COCCI IN CLUSTERS AEROBIC BOTTLE ONLY CRITICAL RESULT CALLED TO, READ  BACK BY AND VERIFIED WITH: E WILLIAMSON,PHARMD AT 9323 12/29/17 BY L BENFIELD    Culture (A)  Final    STAPHYLOCOCCUS AUREUS SUSCEPTIBILITIES PERFORMED ON PREVIOUS CULTURE WITHIN THE LAST 5 DAYS. Performed at [facility], [place] [address], [place], [place] [address]    Report Status 12/30/2017 FINAL  Final  Blood culture (routine x 2)     Status: None   Collection Time: 12/25/17  2:41 PM  Result Value Ref Range Status   Specimen Description   Final    BLOOD RIGHT HAND Performed at [facility], [address] [place], [place] [address]    Special Requests   Final    BOTTLES DRAWN AEROBIC AND ANAEROBIC Blood Culture adequate volume Performed at [facility], [address] [place], [place] [address]    Culture   Final    NO GROWTH 5 DAYS Performed at [facility], [place] [address] [place], [place] [address]    Report Status 12/30/2017 FINAL  Final  MRSA PCR Screening     Status: Abnormal   Collection Time: 12/25/17 11:31 PM  Result Value Ref Range Status   MRSA by PCR POSITIVE (A) NEGATIVE Final    Comment:        The GeneXpert MRSA Assay (FDA approved for NASAL specimens only), is one component of a comprehensive MRSA colonization surveillance program. It is not intended to diagnose MRSA infection nor to guide or monitor treatment for MRSA infections. RESULT CALLED TO, READ BACK BY AND VERIFIED WITH: A SAWYERS,RN @0554  12/26/17 MKELLY Performed at [facility], [place] [address], [place], [place] [address]   Culture, blood (routine x 2)     Status: None (Preliminary result)   Collection Time: 12/29/17 10:50 AM  Result Value Ref Range Status   Specimen Description   Final    BLOOD  RIGHT HAND Performed at [facility] [address], [place], [place] [address]    Special Requests   Final    BOTTLES DRAWN AEROBIC ONLY Blood Culture adequate volume Performed at [facility] [address] [place], [place] [address]    Culture   Final    NO GROWTH 1 DAY Performed at [facility], [place] [address] [place], [place] [address]    Report Status PENDING  Incomplete  Culture, blood (routine x 2)     Status: None (Preliminary result)   Collection Time: 12/29/17 10:50 AM  Result Value Ref Range Status   Specimen Description   Final    BLOOD RIGHT WRIST Performed at [place] [address] [place], [place] [address]    Special Requests   Final    BOTTLES DRAWN AEROBIC AND ANAEROBIC Blood Culture adequate volume Performed at [facility] [address] [place], [place] [address]    Culture   Final    NO GROWTH 1 DAY Performed at [facility], [place] [address] [place], [place] [address]    Report Status PENDING  Incomplete     Radiology Studies: [place] Ekg Site Rite  Result Date: 12/29/2017 If Site Rite image not attached, placement could not be confirmed due to current cardiac rhythm.   Scheduled Meds: . apixaban  2.5 mg Oral BID  . ARIPiprazole  30 mg Oral QHS  . aspirin EC  81 mg Oral Daily  . Chlorhexidine Gluconate Cloth  6 each Topical Q0600  .  divalproex  1,500 mg Oral QHS  . divalproex  250 mg Oral Q lunch  . gabapentin  300 mg Oral QHS  . hydrocortisone sod succinate (SOLU-CORTEF) inj  50 mg Intravenous Q8H  . mirabegron ER  50 mg Oral Daily  . mupirocin ointment  1 application Nasal BID  . polyethylene glycol  17 g Oral Daily  . sodium chloride flush  10-40 mL Intracatheter Q12H  . ziprasidone  80 mg Oral QHS   Continuous Infusions: . sodium chloride 10 mL/hr at 12/30/17 1350  .  ceFAZolin (ANCEF) IV Stopped (12/30/17 1423)     LOS: 5 days    Time  spent: 20 min  Velvet Bathe, MD [facility] Pager [phone]  If 7PM-7AM, please contact night-coverage [email or web address] Password [facility] 12/30/2017, 4:21 PM

## 2017-12-30 NOTE — Progress Notes (Signed)
Patient requesting to leave AMA and "sign himself out." He stated he had a "bad vibe" and his daughter or friend would come get him. I explained to the patient that it would be best if he stayed and discussed with the doctor in the AM. He is agreeable to stay. All questions answered. Will continue to monitor patient closely.

## 2017-12-30 NOTE — Progress Notes (Signed)
Peripherally Inserted Central Catheter/Midline Placement  The IV Nurse has discussed with the patient and/or persons authorized to consent for the patient, the purpose of this procedure and the potential benefits and risks involved with this procedure.  The benefits include less needle sticks, lab draws from the catheter, and the patient may be discharged home with the catheter. Risks include, but not limited to, infection, bleeding, blood clot (thrombus formation), and puncture of an artery; nerve damage and irregular heartbeat and possibility to perform a PICC exchange if needed/ordered by physician.  Alternatives to this procedure were also discussed.  [facility] PICC patient education guide, fact sheet on infection prevention and patient information card has been provided to patient /or left at bedside.    PICC/Midline Placement Documentation        Shaun Yoder 12/30/2017, 10:03 AM

## 2017-12-30 NOTE — Consult Note (Signed)
   [facility] CM Inpatient Consult   12/30/2017  Shaun Yoder [birth year] [id number]   Patient screened for potential [facility] Care Management program.   Spoke with inpatient RNCM to discuss potential [facility] Care Management needs. No [facility] Care Management needs identified.   Spoke with both patient and wife at bedside. Provided [facility] Care Management brochure with 24-hr nurse advice line magnet to contact if [place] Management is needed in the future. Both patient wife express appreciation of visit and deny having any [facility] Care Management needs at this time.    Marthenia Rolling, MSN-Ed, RN,BSN [facility] Liaison [phone]

## 2017-12-30 NOTE — Progress Notes (Addendum)
Patient ID: Shaun Yoder, male   DOB: 1945-10-02, 72 y.o.   MRN: [id number]         [facility] for Infectious Disease  Date of Admission:  12/25/2017    Total days of antibiotics 6        Day 5 cefazolin         ASSESSMENT: He is improving on therapy for right foot cellulitis complicated by MSSA bacteremia.  He told me again today that he prefers not to have a TEE.  I will go ahead with my plan to simply treat him for the possibility of MSSA endocarditis, especially in light of his severe calcific, aortic stenosis.  He is in agreement with that plan.  PLAN: 1. Continue cefazolin 2. I will sign off now but arrange follow-up in my clinic  Diagnosis: MSSA bacteremia  Culture Result: MSSA  Allergies  Allergen Reactions  . Leflunomide Other (See Comments)    diarrhea  . Morphine And Related Nausea And Vomiting  . Plaquenil [Hydroxychloroquine Sulfate]     rash    OPAT Orders Discharge antibiotics: Per pharmacy protocol cefazolin  Duration: 6 weeks End Date: 02/05/2018  [facility] Care Per Protocol:  Labs weekly while on IV antibiotics: _x_ CBC with differential _x_ BMP __ CMP __ CRP __ ESR __ Vancomycin trough  _x_ Please pull PIC at completion of IV antibiotics __ Please leave PIC in place until doctor has seen patient or been notified  Fax weekly labs to [phone]  Clinic Follow Up Appt: I will arrange follow-up in my clinic on 02/05/2018  Principal Problem:   Bacteremia due to methicillin susceptible Staphylococcus aureus (MSSA) Active Problems:   Sepsis ([place])   Cellulitis of right leg   Aortic stenosis, severe   Schizoaffective disorder, bipolar type ([place])   Rheumatoid arthritis ([place])   Left leg DVT ([place])   Scheduled Meds: . apixaban  2.5 mg Oral BID  . ARIPiprazole  30 mg Oral QHS  . aspirin EC  81 mg Oral Daily  . Chlorhexidine Gluconate Cloth  6 each Topical Q0600  . divalproex  1,500 mg Oral QHS  . divalproex  250 mg Oral Q lunch  .  gabapentin  300 mg Oral QHS  . hydrocortisone sod succinate (SOLU-CORTEF) inj  50 mg Intravenous Q8H  . mirabegron ER  50 mg Oral Daily  . mupirocin ointment  1 application Nasal BID  . polyethylene glycol  17 g Oral Daily  . sodium chloride flush  10-40 mL Intracatheter Q12H  . ziprasidone  80 mg Oral QHS   Continuous Infusions: . sodium chloride 10 mL/hr at 12/30/17 1350  .  ceFAZolin (ANCEF) IV Stopped (12/30/17 1423)   PRN Meds:.acetaminophen **OR** acetaminophen, fentaNYL (SUBLIMAZE) injection, ondansetron **OR** ondansetron (ZOFRAN) IV, polyvinyl alcohol, sodium chloride flush   SUBJECTIVE: He is feeling much better.    Review of Systems: Review of Systems  Unable to perform ROS: Mental acuity    Allergies  Allergen Reactions  . Leflunomide Other (See Comments)    diarrhea  . Morphine And Related Nausea And Vomiting  . Plaquenil [Hydroxychloroquine Sulfate]     rash    OBJECTIVE: Vitals:   12/29/17 2058 12/29/17 2253 12/30/17 0636 12/30/17 1235  BP: (!) 144/88 (!) 149/74 106/64 134/80  Pulse: 64 (!) 51 64 64  Resp: 18  20 (!) 21  Temp: 98.9 F (37.2 C)  97.7 F (36.5 C) 97.9 F (36.6 C)  TempSrc:    Oral  SpO2:  95%  92% 95%  Weight:      Height:       Body mass index is 32.42 kg/m.  Physical Exam  Constitutional:  He is very alert, pleasant and talkative.  His wife is at the bedside.    Cardiovascular: Normal rate and regular rhythm.  Murmur heard. No change in harsh 2/6 systolic murmur.  Pulmonary/Chest: Effort normal and breath sounds normal.  Neurological: He is alert.  Skin:  Right foot cellulitis has resolved.  New right arm PICC.    Lab Results Lab Results  Component Value Date   WBC 7.3 12/27/2017   HGB 12.5 (L) 12/27/2017   HCT 37.6 (L) 12/27/2017   MCV 94.7 12/27/2017   PLT 111 (L) 12/27/2017    Lab Results  Component Value Date   CREATININE 0.59 (L) 12/26/2017   BUN 20 12/26/2017   NA 138 12/26/2017   K 4.0 12/26/2017   CL  101 12/26/2017   CO2 27 12/26/2017    Lab Results  Component Value Date   ALT 26 12/26/2017   AST 37 12/26/2017   ALKPHOS 59 12/26/2017   BILITOT 1.0 12/26/2017     Microbiology: Recent Results (from the past 240 hour(s))  Culture, blood (Routine X 2) w Reflex to ID Panel     Status: Abnormal   Collection Time: 12/24/17  9:30 PM  Result Value Ref Range Status   Specimen Description   Final    BLOOD RIGHT HAND Performed at [facility], [address] [place], [place] [address]    Special Requests   Final    BOTTLES DRAWN AEROBIC ONLY Blood Culture results may not be optimal due to an inadequate volume of blood received in culture bottles Performed at [facility], [address] [place], [place] [address]    Culture  Setup Time   Final    GRAM POSITIVE COCCI AEROBIC BOTTLE ONLY CRITICAL RESULT CALLED TO, READ BACK BY AND VERIFIED WITH: Lavell Luster [facility] 2206 12/25/17 A BROWNING    Culture (A)  Final    STAPHYLOCOCCUS AUREUS SUSCEPTIBILITIES PERFORMED ON PREVIOUS CULTURE WITHIN THE LAST 5 DAYS. Performed at [facility], [place] [address], [place], [place] [address]    Report Status 12/27/2017 FINAL  Final  Culture, blood (Routine X 2) w Reflex to ID Panel     Status: Abnormal   Collection Time: 12/24/17 10:15 PM  Result Value Ref Range Status   Specimen Description   Final    BLOOD RIGHT ANTECUBITAL Performed at [facility], [address] [place], [place] [address]    Special Requests   Final    BOTTLES DRAWN AEROBIC AND ANAEROBIC Blood Culture adequate volume Performed at [facility], [address] [place], [place] [address]    Culture  Setup Time   Final    GRAM POSITIVE COCCI IN BOTH AEROBIC AND ANAEROBIC BOTTLES CRITICAL RESULT CALLED TO, READ BACK BY AND VERIFIED WITHLavell Luster [facility] 2206 12/25/17 A BROWNING Performed at [facility], [place] [address] [place], [place] [address]    Culture  STAPHYLOCOCCUS AUREUS (A)  Final   Report Status 12/27/2017 FINAL  Final   Organism ID, Bacteria STAPHYLOCOCCUS AUREUS  Final      Susceptibility   Staphylococcus aureus - MIC*    CIPROFLOXACIN <=0.5 SENSITIVE Sensitive     ERYTHROMYCIN <=0.25 SENSITIVE Sensitive     GENTAMICIN <=0.5 SENSITIVE Sensitive  OXACILLIN 0.5 SENSITIVE Sensitive     TETRACYCLINE <=1 SENSITIVE Sensitive     VANCOMYCIN 1 SENSITIVE Sensitive     TRIMETH/SULFA <=10 SENSITIVE Sensitive     CLINDAMYCIN <=0.25 SENSITIVE Sensitive     RIFAMPIN <=0.5 SENSITIVE Sensitive     Inducible Clindamycin NEGATIVE Sensitive     * STAPHYLOCOCCUS AUREUS  Blood Culture ID Panel (Reflexed)     Status: Abnormal   Collection Time: 12/24/17 10:15 PM  Result Value Ref Range Status   Enterococcus species NOT DETECTED NOT DETECTED Final   Listeria monocytogenes NOT DETECTED NOT DETECTED Final   Staphylococcus species DETECTED (A) NOT DETECTED Final    Comment: CRITICAL RESULT CALLED TO, READ BACK BY AND VERIFIED WITH: Lavell Luster [facility] 2206 12/25/17 A BROWNING    Staphylococcus aureus DETECTED (A) NOT DETECTED Final    Comment: Methicillin (oxacillin) susceptible Staphylococcus aureus (MSSA). Preferred therapy is anti staphylococcal beta lactam antibiotic (Cefazolin or Nafcillin), unless clinically contraindicated. CRITICAL RESULT CALLED TO, READ BACK BY AND VERIFIED WITH: Lavell Luster [facility] 2206 12/25/17 A BROWNING    Methicillin resistance NOT DETECTED NOT DETECTED Final   Streptococcus species NOT DETECTED NOT DETECTED Final   Streptococcus agalactiae NOT DETECTED NOT DETECTED Final   Streptococcus pneumoniae NOT DETECTED NOT DETECTED Final   Streptococcus pyogenes NOT DETECTED NOT DETECTED Final   Acinetobacter baumannii NOT DETECTED NOT DETECTED Final   Enterobacteriaceae species NOT DETECTED NOT DETECTED Final   Enterobacter cloacae complex NOT DETECTED NOT DETECTED Final   Escherichia coli NOT DETECTED NOT DETECTED Final    Klebsiella oxytoca NOT DETECTED NOT DETECTED Final   Klebsiella pneumoniae NOT DETECTED NOT DETECTED Final   Proteus species NOT DETECTED NOT DETECTED Final   Serratia marcescens NOT DETECTED NOT DETECTED Final   Haemophilus influenzae NOT DETECTED NOT DETECTED Final   Neisseria meningitidis NOT DETECTED NOT DETECTED Final   Pseudomonas aeruginosa NOT DETECTED NOT DETECTED Final   Candida albicans NOT DETECTED NOT DETECTED Final   Candida glabrata NOT DETECTED NOT DETECTED Final   Candida krusei NOT DETECTED NOT DETECTED Final   Candida parapsilosis NOT DETECTED NOT DETECTED Final   Candida tropicalis NOT DETECTED NOT DETECTED Final    Comment: Performed at [facility], [address], [place], [place] [address]  Blood culture (routine x 2)     Status: Abnormal   Collection Time: 12/25/17  2:36 PM  Result Value Ref Range Status   Specimen Description   Final    BLOOD LEFT ARM Performed at [facility], [address] [place], [place] [address]    Special Requests   Final    BOTTLES DRAWN AEROBIC AND ANAEROBIC Blood Culture adequate volume Performed at [facility], [address] [place], [place] [address]    Culture  Setup Time   Final    GRAM POSITIVE COCCI IN CLUSTERS AEROBIC BOTTLE ONLY CRITICAL RESULT CALLED TO, READ BACK BY AND VERIFIED WITH: E WILLIAMSON,PHARMD AT 5329 12/29/17 BY L BENFIELD    Culture (A)  Final    STAPHYLOCOCCUS AUREUS SUSCEPTIBILITIES PERFORMED ON PREVIOUS CULTURE WITHIN THE LAST 5 DAYS. Performed at [facility], [place] [address] [place], [place] [address]    Report Status 12/30/2017 FINAL  Final  Blood culture (routine x 2)     Status: None   Collection Time: 12/25/17  2:41 PM  Result Value Ref Range Status   Specimen Description   Final    BLOOD RIGHT  HAND Performed at [facility], [address] [place], [place] [address]    Special Requests   Final    BOTTLES DRAWN AEROBIC AND ANAEROBIC Blood  Culture adequate volume Performed at [facility], [address] [place], [place] [address]    Culture   Final    NO GROWTH 5 DAYS Performed at [facility], [place] [address] [place], [place] [address]    Report Status 12/30/2017 FINAL  Final  MRSA PCR Screening     Status: Abnormal   Collection Time: 12/25/17 11:31 PM  Result Value Ref Range Status   MRSA by PCR POSITIVE (A) NEGATIVE Final    Comment:        The GeneXpert MRSA Assay (FDA approved for NASAL specimens only), is one component of a comprehensive MRSA colonization surveillance program. It is not intended to diagnose MRSA infection nor to guide or monitor treatment for MRSA infections. RESULT CALLED TO, READ BACK BY AND VERIFIED WITH: A SAWYERS,RN @0554  12/26/17 MKELLY Performed at [facility], [place] [address], [place], [place] [address]   Culture, blood (routine x 2)     Status: None (Preliminary result)   Collection Time: 12/29/17 10:50 AM  Result Value Ref Range Status   Specimen Description   Final    BLOOD RIGHT HAND Performed at [place] [address] [place], [place] [address]    Special Requests   Final    BOTTLES DRAWN AEROBIC ONLY Blood Culture adequate volume Performed at [place] [address] [place], [place] [address]    Culture   Final    NO GROWTH 1 DAY Performed at [facility], [place] [address] [place], [place] [address]    Report Status PENDING  Incomplete  Culture, blood (routine x 2)     Status: None (Preliminary result)   Collection Time: 12/29/17 10:50 AM  Result Value Ref Range Status   Specimen Description   Final    BLOOD RIGHT WRIST Performed at [place] [address] [place], [place] [address]    Special Requests   Final    BOTTLES DRAWN AEROBIC AND ANAEROBIC Blood Culture adequate volume Performed at [place] [address],  [place], [place] [address]    Culture   Final    NO GROWTH 1 DAY Performed at [facility], [place] [address] [place], [place] [address]    Report Status PENDING  Incomplete    Michel Bickers, MD [facility] [phone] pager   [phone] cell 12/30/2017, 4:02 PM

## 2017-12-30 NOTE — Evaluation (Signed)
Physical Therapy Evaluation Patient Details Name: Shaun Yoder MRN: [id number] DOB: 01/19/1946 Today's Date: 12/30/2017   History of Present Illness  72 yo male admitted with sepsis bacteremia, AMS, L shoulder pain (from accident that occurred in wheelchair). Hx of severe aortic stenosis, schizoaffective d/o, bipolar d/o, RA, OA, DVT, chronic pain, obesity, lymphedema, cellulitis.     Clinical Impression  On eval, pt required Mod assist for mobility. He was able to stand and pivot to recliner with great effort. Pt presents with general weakness, decreased activity tolerance, and impaired gait and balance. He is currently at risk for falls when mobilizing. Wife was present during eval. She expressed concerns about being able to manage pt's care at home. Pt was not agreeable to placement when discussed during eval. Will follow and progress activity as tolerated. Recommend SNF at this time.     Follow Up Recommendations SNF;Supervision/Assistance - 24 hour(HHPT if pt refuses placement)    Equipment Recommendations  None recommended by PT    Recommendations for Other Services       Precautions / Restrictions Precautions Precautions: Fall Precaution Comments: chronically debilitated shoulder and L hip Restrictions Weight Bearing Restrictions: No      Mobility  Bed Mobility Overal bed mobility: Needs Assistance Bed Mobility: Supine to Sit     Supine to sit: Mod assist;HOB elevated     General bed mobility comments: Assist for trunk and L LE. Assist to scoot to EOB with use of bedpad. Increased time. Pt pulled on armrest of recliner.   Transfers Overall transfer level: Needs assistance   Transfers: Sit to/from Stand;Stand Pivot Transfers Sit to Stand: Mod assist;From elevated surface Stand pivot transfers: Min assist       General transfer comment: Assist to rise, stabilize, control descent. Difficulty finding good UE placement due to shoulder limitations. Placed RW in  front of pt for R UE support (normally uses tripod cane-not available in room). He was unable to tolerate WBing with L UE.  Pt takes very short, choppy/shuffle like steps to get to chair. Pt fatigued halfway during transfer and had to rest in standing. O2 sat >90% on RA. Pt is at risk for falls.   Ambulation/Gait             General Gait Details: NT-nonambulatory  Stairs            Wheelchair Mobility    Modified Rankin (Stroke Patients Only)       Balance Overall balance assessment: Needs assistance   Sitting balance-Leahy Scale: Fair       Standing balance-Leahy Scale: Poor                               Pertinent Vitals/Pain Pain Assessment: Faces Faces Pain Scale: Hurts even more Pain Location: L shoulder Pain Descriptors / Indicators: Sore;Aching Pain Intervention(s): Monitored during session;Repositioned    Home Living Family/patient expects to be discharged to:: Private residence Living Arrangements: Spouse/significant other Available Help at Discharge: Family;Available 24 hours/day(aide 5 days/week for 3 hours) Type of Home: House Home Access: Ramped entrance     Home Layout: One level Home Equipment: Other (comment);Wheelchair - power;Hospital bed;Walker - 4 wheels;Cane - quad;Grab bars - toilet;Grab bars - tub/shower;Hand held shower head(bedrails, valet seat for car entry) Additional Comments: gait belt, transfer board, reacher orderd from [place]    Prior Function Level of Independence: Needs assistance   Gait / Transfers Assistance Needed: pt will  say he can transfer unassisted (wife does not endorse this)  ADL's / Homemaking Assistance Needed: aide assists with shower        Hand Dominance        Extremity/Trunk Assessment   Upper Extremity Assessment Upper Extremity Assessment: RUE deficits/detail;LUE deficits/detail RUE Deficits / Details: chronic limited ROM due to arthritis LUE Deficits / Details: chronic limited ROM due  to arthritis LUE: Unable to fully assess due to pain    Lower Extremity Assessment Lower Extremity Assessment: Generalized weakness    Cervical / Trunk Assessment Cervical / Trunk Assessment: Kyphotic  Communication   Communication: No difficulties  Cognition Arousal/Alertness: Awake/alert Behavior During Therapy: WFL for tasks assessed/performed   Area of Impairment: Attention                   Current Attention Level: Divided           General Comments: pt tends to jump from subject to subject      General Comments      Exercises     Assessment/Plan    PT Assessment Patient needs continued PT services  PT Problem List Decreased strength;Decreased balance;Decreased mobility;Decreased activity tolerance;Decreased range of motion;Decreased coordination;Decreased knowledge of use of DME;Decreased safety awareness       PT Treatment Interventions DME instruction;Functional mobility training;Therapeutic activities;Therapeutic exercise;Balance training;Patient/family education    PT Goals (Current goals can be found in the Care Plan section)  Acute Rehab PT Goals Patient Stated Goal: home PT Goal Formulation: With patient/family Time For Goal Achievement: 01/13/18 Potential to Achieve Goals: Good    Frequency Min 3X/week   Barriers to discharge        Co-evaluation               AM-PAC PT "6 Clicks" Daily Activity  Outcome Measure Difficulty turning over in bed (including adjusting bedclothes, sheets and blankets)?: Unable Difficulty moving from lying on back to sitting on the side of the bed? : Unable Difficulty sitting down on and standing up from a chair with arms (e.g., wheelchair, bedside commode, etc,.)?: Unable Help needed moving to and from a bed to chair (including a wheelchair)?: A Lot Help needed walking in hospital room?: Total Help needed climbing 3-5 steps with a railing? : Total 6 Click Score: 7    End of Session Equipment  Utilized During Treatment: Gait belt Activity Tolerance: Patient limited by fatigue Patient left: in chair;with call bell/phone within reach   PT Visit Diagnosis: Other abnormalities of gait and mobility (R26.89);Muscle weakness (generalized) (M62.81)    Time: 9233-0076 PT Time Calculation (min) (ACUTE ONLY): 39 min   Charges:   PT Evaluation $PT Eval Moderate Complexity: 1 Mod PT Treatments $Therapeutic Activity: 23-37 mins   PT G Codes:         Weston Anna, MPT Pager: [phone]

## 2017-12-31 ENCOUNTER — Emergency Department (HOSPITAL_COMMUNITY)
Admission: EM | Admit: 2017-12-31 | Discharge: 2018-01-01 | Disposition: A | Discharge status: Home / Self Care | Payer: Medicare Other | Attending: Emergency Medicine | Admitting: Emergency Medicine

## 2017-12-31 ENCOUNTER — Encounter (HOSPITAL_COMMUNITY): Payer: Self-pay | Admitting: Emergency Medicine

## 2017-12-31 DIAGNOSIS — Z7901 Long term (current) use of anticoagulants: Secondary | ICD-10-CM | POA: Insufficient documentation

## 2017-12-31 DIAGNOSIS — Y658 Other specified misadventures during surgical and medical care: Secondary | ICD-10-CM | POA: Insufficient documentation

## 2017-12-31 DIAGNOSIS — Z7982 Long term (current) use of aspirin: Secondary | ICD-10-CM | POA: Insufficient documentation

## 2017-12-31 DIAGNOSIS — B9561 Methicillin susceptible Staphylococcus aureus infection as the cause of diseases classified elsewhere: Secondary | ICD-10-CM | POA: Diagnosis not present

## 2017-12-31 DIAGNOSIS — I872 Venous insufficiency (chronic) (peripheral): Secondary | ICD-10-CM | POA: Diagnosis not present

## 2017-12-31 DIAGNOSIS — I10 Essential (primary) hypertension: Secondary | ICD-10-CM | POA: Diagnosis not present

## 2017-12-31 DIAGNOSIS — Z96653 Presence of artificial knee joint, bilateral: Secondary | ICD-10-CM | POA: Insufficient documentation

## 2017-12-31 DIAGNOSIS — Z87891 Personal history of nicotine dependence: Secondary | ICD-10-CM | POA: Insufficient documentation

## 2017-12-31 DIAGNOSIS — T82838A Hemorrhage of vascular prosthetic devices, implants and grafts, initial encounter: Secondary | ICD-10-CM | POA: Diagnosis not present

## 2017-12-31 DIAGNOSIS — T829XXA Unspecified complication of cardiac and vascular prosthetic device, implant and graft, initial encounter: Secondary | ICD-10-CM | POA: Diagnosis not present

## 2017-12-31 DIAGNOSIS — Z8551 Personal history of malignant neoplasm of bladder: Secondary | ICD-10-CM | POA: Insufficient documentation

## 2017-12-31 DIAGNOSIS — Z79899 Other long term (current) drug therapy: Secondary | ICD-10-CM | POA: Insufficient documentation

## 2017-12-31 LAB — GLUCOSE, CAPILLARY
GLUCOSE-CAPILLARY: 135 mg/dL — AB (ref 65–99)
Glucose-Capillary: 116 mg/dL — ABNORMAL HIGH (ref 65–99)
Glucose-Capillary: 95 mg/dL (ref 65–99)

## 2017-12-31 MED ORDER — CEFAZOLIN IV (FOR PTA / DISCHARGE USE ONLY): 2.0000 g | Freq: Three times a day (TID) | INTRAVENOUS | 0 refills | Status: AC

## 2017-12-31 MED ORDER — HEPARIN SOD (PORK) LOCK FLUSH 100 UNIT/ML IV SOLN
250.0000 [IU] | INTRAVENOUS | Status: AC | PRN
  Administered 2017-12-31: 250 [IU]

## 2017-12-31 NOTE — Evaluation (Signed)
Occupational Therapy Evaluation Patient Details Name: Shaun Yoder MRN: [id number] DOB: 05/13/1946 Today's Date: 12/31/2017    History of Present Illness 72 yo male admitted with sepsis bacteremia, AMS, L shoulder pain (from accident that occurred in wheelchair). Hx of severe aortic stenosis, schizoaffective d/o, bipolar d/o, RA, OA, DVT, chronic pain, obesity, lymphedema, cellulitis.    Clinical Impression   Pt admitted with the above diagnosis. Pt currently with functional limitations due to the deficits listed below (see OT Problem List).  Pt will benefit from skilled OT to increase their safety and independence with ADL and functional mobility for ADL to facilitate discharge to venue listed below.      Follow Up Recommendations  SNF    Equipment Recommendations  Other (comment)    Recommendations for Other Services       Precautions / Restrictions Precautions Precautions: Fall Precaution Comments: chronically debilitated shoulder and L hip Restrictions Weight Bearing Restrictions: No      Mobility Bed Mobility Overal bed mobility: Needs Assistance Bed Mobility: Rolling Rolling: Mod assist   Supine to sit: Mod assist        Transfers                 General transfer comment: pts wife reports pt needed 2-3 A to get to toilet. Pt refused to transfer with OT    Balance                                           ADL either performed or assessed with clinical judgement   ADL Overall ADL's : Needs assistance/impaired                                       General ADL Comments: Pt returned from toilet in which 3 staff had to A pt. Disccussed safety concerns regarding ADL's at home. Pt became agitated speaking rudely to wife.  Pt does not seem to understand amount of A he is currently needing.  OT reccomends SNF for this pt  At this time pt does have 3 hours of care 5 days a week at home.  Pts wife not able to A due to  her RA.                   Pertinent Vitals/Pain Pain Score: 4  Pain Location: L shoulder Pain Descriptors / Indicators: Sore;Aching Pain Intervention(s): Limited activity within patient's tolerance     Hand Dominance     Extremity/Trunk Assessment             Communication Communication Communication: No difficulties   Cognition Arousal/Alertness: Awake/alert Behavior During Therapy: Agitated   Area of Impairment: Safety/judgement                               General Comments: pt with decreased awareness of situation and awareness of amount of A pt needs.  Pts wife with RA and cant A pt as he wants her too   Shaun Yoder expects to be discharged to:: Private residence Living Arrangements: Spouse/significant other Available Help at Discharge: Family;Available 24 hours/day(aide 5 days/week for 3 hours) Type  of Home: House Home Access: Ramped entrance     Home Layout: One level     Bathroom Shower/Tub: Tub/shower unit(walk in)   Bathroom Toilet: Handicapped height     Home Equipment: Other (comment);Wheelchair - power;Hospital bed;Walker - 4 wheels;Cane - quad;Grab bars - toilet;Grab bars - tub/shower;Hand held shower head(bedrails, valet seat for car entry)   Additional Comments: gait belt, transfer board, reacher orderd from [place]      Prior Functioning/Environment Level of Independence: Needs assistance  Gait / Transfers Assistance Needed: pt will say he can transfer unassisted (wife does not endorse this)  ADL's / Homemaking Assistance Needed: aide assists with shower            OT Problem List: Decreased strength;Decreased activity tolerance;Impaired balance (sitting and/or standing);Decreased safety awareness;Decreased knowledge of precautions;Obesity;Pain      OT Treatment/Interventions: Self-care/ADL training;Patient/family education;DME and/or AE instruction    OT Goals(Current  goals can be found in the care plan section) Acute Rehab OT Goals Patient Stated Goal: home OT Goal Formulation: With patient Time For Goal Achievement: 01/14/18 ADL Goals Pt Will Perform Grooming: with supervision;sitting Pt Will Perform Lower Body Dressing: with supervision;sitting/lateral leans;with min assist;sit to/from stand Pt Will Transfer to Toilet: with min assist;bedside commode;stand pivot transfer Pt Will Perform Toileting - Clothing Manipulation and hygiene: sitting/lateral leans;sit to/from stand;with min assist  OT Frequency: Min 2X/week   Barriers to D/C: Decreased caregiver support  wife had RA and can provide limited A       Co-evaluation              AM-PAC PT "6 Clicks" Daily Activity     Outcome Measure Help from another person eating meals?: A Little Help from another person taking care of personal grooming?: A Little Help from another person toileting, which includes using toliet, bedpan, or urinal?: Total Help from another person bathing (including washing, rinsing, drying)?: A Lot Help from another person to put on and taking off regular upper body clothing?: A Lot Help from another person to put on and taking off regular lower body clothing?: Total 6 Click Score: 12   End of Session Nurse Communication: Mobility status  Activity Tolerance: Treatment limited secondary to agitation Patient left: in bed;with call bell/phone within reach  OT Visit Diagnosis: Unsteadiness on feet (R26.81);Other abnormalities of gait and mobility (R26.89);Muscle weakness (generalized) (M62.81);History of falling (Z91.81);Repeated falls (R29.6);Pain                Time: 3428-7681 OT Time Calculation (min): 22 min Charges:  OT General Charges $OT Visit: 1 Visit OT Evaluation $OT Eval Moderate Complexity: 1 Mod G-Codes:     Kari Baars, OT [phone]  Payton Mccallum D 12/31/2017, 3:03 PM

## 2017-12-31 NOTE — Progress Notes (Signed)
Pt discharged from the unit via wheelchair. IV team capped and flushed PICC line for home use. Discharge instructions were clarified with the wife about [facility] delivering the medications between 7-9 pm tonight. No questions or concerns at this time.

## 2017-12-31 NOTE — Care Management Note (Signed)
Case Management Note  Patient Details  Name: Shaun Yoder MRN: [id number] Date of Birth: 02-26-1946  Subjective/Objective: Awaiting all HHC, iv abx orders-MD notified. Patient has 4 prong cane-no new cane is needed even though ordered. [facility] iv infusion liason-[name] following for iv abx,[facility] rep [name] following for all other [place] orders-HHRN/PT/OT/aide/csw. Patient has private duty care.                   Action/Plan:d/c home w/HHC/iv abx.   Expected Discharge Date:                  Expected Discharge Plan:  [place]  In-House Referral:     Discharge planning Services  CM Consult  Post Acute Care Choice:  Durable Medical Equipment(w/c,rollator,ramps,handicapped vehicle,private duty aide) Choice offered to:  Patient  DME Arranged:    DME Agency:     HH Arranged:  RN, PT, OT, IV Antibiotics, Nurse's Aide, Social Work, Refused SNF [place] Agency:  [place]  Status of Service:  Completed, signed off  If discussed at [employer], dates discussed:    Additional Comments:  [name], RN 12/31/2017, 12:18 PM

## 2017-12-31 NOTE — Progress Notes (Signed)
Case Mgr confirmed w/[facility] rep [name] iv infusion-initial instruction has been done [facility] pharmacy will deliver abx to home between 6-8p. Spouse will give 10 o clock dose on own tonight. [facility] HHRN will come in am to continue instruction for iv abx. Nurse will not give spouse the script for the iv abx since [facility] pharmacy has the electronic script for the medicine. No further CM needs.

## 2017-12-31 NOTE — ED Triage Notes (Addendum)
Patient reports he was discharged today after admission for sepsis. Reports d/c with PICC line that is bleeding on dressing. Patient reports taking blood thinners.

## 2017-12-31 NOTE — Discharge Summary (Signed)
Physician Discharge Summary  Shaun Yoder QBV:[id number] DOB: 05-06-1946 DOA: 12/25/2017  PCP: Colon Branch, MD  Admit date: 12/25/2017 Discharge date: 12/31/2017  Time spent: 36 minutes  Recommendations for Outpatient Follow-up:  1. Ensure ID specialist is getting weekly labs    Discharge Diagnoses:  Principal Problem:   Bacteremia due to methicillin susceptible Staphylococcus aureus (MSSA) Active Problems:   Aortic stenosis, severe   Schizoaffective disorder, bipolar type (HCC)   Rheumatoid arthritis ([place])   Left leg DVT (HCC)   Sepsis (HCC)   Cellulitis of right leg   Discharge Condition: stable  Diet recommendation: Heart healthy  Filed Weights   12/25/17 1421  Weight: 93.9 kg (207 lb)    History of present illness:  72 y.o.malewithhistory of rheumatoid arthritis, severe aortic stenosis, thrombocytopenia, DVT on apixaban presents to the ER because of increasing confusion and fever.  Pt diagnosed with sepsis secondary to cellulitis. Dx with Staph Aureus bacteremia. Plan is for picc line and 6 wks of antibiotics  Hospital Course:    Principal Problem:   Bacteremia due to methicillin susceptible Staphylococcus aureus (MSSA)/Sepsis (HCC)/Cellulitis of right leg - ID consulted and assisting with case. - Continue Cefazolin. Last blood culture positive will obtain another set of blood cultures.  - TTE reporting EF of 60-65% with grade 1 diastolic dysfunction - Patient refuses TEE and as such prefers to be treated for 6 weeks with antibiotics.  Patient has OPAT orders and plan will be for weekly labs CBC and BMP to be sent to infectious disease doctor for further monitoring. - Home health to assist with antibiotics at home. Wife has been taught as well.  Active Problems:   Aortic stenosis, severe - Stable will continue prior to admission medication regimen    Schizoaffective disorder, bipolar type ([place]) - Stable, continue prior to admission medication  management    Rheumatoid arthritis ([place]) - Stable, continue prior to admission medication regimen    Left leg DVT ([place]) -Continue prior to admission anticoagulant  Procedures:  None  Consultations:  Infectious disease specialist: Michel Bickers  Discharge Exam: Vitals:   12/30/17 2051 12/31/17 0637  BP: (!) 137/54 (!) 144/85  Pulse: 60 (!) 52  Resp: 16 18  Temp: 98 F (36.7 C) 98.3 F (36.8 C)  SpO2: 93% 92%    General: Pt in nad, alert and awake Cardiovascular: rrr, no rubs Respiratory: no increased wob, no wheezes  Discharge Instructions   Discharge Instructions    Diet - low sodium heart healthy   Complete by:  As directed    Discharge instructions   Complete by:  As directed    Please follow up with your infectious disease specialist.   Home infusion instructions [facility] May follow [place] Dosing Protocol; May administer Cathflo as needed to maintain patency of vascular access device.; Flushing of vascular access device: per [facility] Protocol: 0.9% NaCl pre/post medica...   Complete by:  As directed    Instructions:  May follow [place] Dosing Protocol   Instructions:  May administer Cathflo as needed to maintain patency of vascular access device.   Instructions:  Flushing of vascular access device: per [facility] Protocol: 0.9% NaCl pre/post medication administration and prn patency; Heparin 100 u/ml, 57m for implanted ports and Heparin 10u/ml, 566mfor all other central venous catheters.   Instructions:  May follow [facility] Anaphylaxis Protocol for First Dose Administration in the home: 0.9% NaCl at 25-50 ml/hr to maintain IV access for protocol meds. Epinephrine 0.3  ml IV/IM PRN and Benadryl 25-50 IV/IM PRN s/s of anaphylaxis.   Instructions:  [place] Infusion Coordinator (RN) to assist per patient IV care needs in the home PRN.   Increase activity slowly   Complete by:  As directed      Allergies as of 12/31/2017      Reactions   Leflunomide  Other (See Comments)   diarrhea   Morphine And Related Nausea And Vomiting   Plaquenil [hydroxychloroquine Sulfate]    rash      Medication List    STOP taking these medications   ciprofloxacin 0.3 % ophthalmic solution Commonly known as:  CILOXAN   clindamycin 300 MG capsule Commonly known as:  CLEOCIN   methotrexate 2.5 MG tablet Commonly known as:  RHEUMATREX     TAKE these medications   acetaminophen-codeine 300-30 MG tablet Commonly known as:  TYLENOL #3 Take 1 tablet by mouth every 4 (four) hours as needed for moderate pain.   ARIPiprazole 30 MG tablet Commonly known as:  ABILIFY Take 30 mg by mouth at bedtime.   aspirin EC 81 MG tablet Take 81 mg by mouth daily.   augmented betamethasone dipropionate 0.05 % cream Commonly known as:  DIPROLENE-AF Apply topically 2 (two) times daily. Apply to the rash of the right side of the face   CALCIUM 500/D 500-200 MG-UNIT tablet Generic drug:  calcium-vitamin D Take 1 tablet by mouth daily with breakfast.   ceFAZolin IVPB Commonly known as:  ANCEF Inject 2 g into the vein every 8 (eight) hours. Indication:  MSSA bacteremia Last Day of Therapy:  02/05/2018 Labs - Once weekly:  CBC/D and BMP, Labs - Every other week:  ESR and CRP   divalproex 250 MG DR tablet Commonly known as:  DEPAKOTE Take 250 mg by mouth daily with lunch.   divalproex 500 MG 24 hr tablet Commonly known as:  DEPAKOTE ER 3 tabs at bedtime   ELIQUIS 5 MG Tabs tablet Generic drug:  apixaban Take 2.5 mg by mouth 2 (two) times daily.   folic acid 1 MG tablet Commonly known as:  FOLVITE Take 1 mg by mouth daily.   furosemide 20 MG tablet Commonly known as:  LASIX Take 1 tablet (20 mg total) by mouth daily.   gabapentin 300 MG capsule Commonly known as:  NEURONTIN Take 300 mg by mouth daily.   hydrocortisone cream 1 % Apply on affected areas on face and behind ears twice daily for 14 days.   ketoconazole 2 % shampoo Commonly known as:   NIZORAL Lather and massage into scalp 2-3 times per week. Leave for 5 minutes before rinsing.   Melatonin 3 MG Tabs Take 3 mg by mouth at bedtime.   mirabegron ER 50 MG Tb24 tablet Commonly known as:  MYRBETRIQ Take 50 mg by mouth daily.   nystatin 100000 UNIT/ML suspension Commonly known as:  MYCOSTATIN Take 5 mLs (500,000 Units total) by mouth 4 (four) times daily.   Omega-3 1000 MG Caps Take 1 capsule by mouth daily.   omeprazole 20 MG capsule Commonly known as:  PRILOSEC Take 20 mg by mouth every morning.   oxybutynin 5 MG tablet Commonly known as:  DITROPAN Take 5 mg by mouth 2 (two) times daily.   OXYGEN Inhale into the lungs. To use if O2 drops below 85 %   predniSONE 5 MG tablet Commonly known as:  DELTASONE Take 5 mg by mouth daily with breakfast.   traMADol 50 MG tablet Commonly  known as:  ULTRAM Take 1 tablet (50 mg total) by mouth every 6 (six) hours as needed.   vitamin C 500 MG tablet Commonly known as:  ASCORBIC ACID Take 500 mg by mouth daily.   XELJANZ XR 11 MG Tb24 Generic drug:  Tofacitinib Citrate Take 1 tablet by mouth daily.   ziprasidone 80 MG capsule Commonly known as:  GEODON Take 80 mg by mouth daily.            Home Infusion Instuctions  (From admission, onward)        Start     Ordered   12/31/17 0000  Home infusion instructions [facility] May follow [place] Dosing Protocol; May administer Cathflo as needed to maintain patency of vascular access device.; Flushing of vascular access device: per [facility] Protocol: 0.9% NaCl pre/post medica...    Question Answer Comment  Instructions May follow [place] Dosing Protocol   Instructions May administer Cathflo as needed to maintain patency of vascular access device.   Instructions Flushing of vascular access device: per [facility] Protocol: 0.9% NaCl pre/post medication administration and prn patency; Heparin 100 u/ml, 96m for implanted ports and Heparin 10u/ml, 562mfor all  other central venous catheters.   Instructions May follow [facility] Anaphylaxis Protocol for First Dose Administration in the home: 0.9% NaCl at 25-50 ml/hr to maintain IV access for protocol meds. Epinephrine 0.3 ml IV/IM PRN and Benadryl 25-50 IV/IM PRN s/s of anaphylaxis.   Instructions [facility] Infusion Coordinator (RN) to assist per patient IV care needs in the home PRN.      12/31/17 1229       Durable Medical Equipment  (From admission, onward)        Start     Ordered   12/30/17 1609  For home use only DME Cane  Once    Comments:  Pt wants a 3 prong if possible.   12/30/17 1608     Allergies  Allergen Reactions  . Leflunomide Other (See Comments)    diarrhea  . Morphine And Related Nausea And Vomiting  . Plaquenil [Hydroxychloroquine Sulfate]     rash   Follow-up Information    [facility], [facility]-Home Follow up.   Specialty:  [place]  [facility],instruction,supplies, physical therapy/occupational therapy,aide,social worker Contact information: [address] [place] [phone]          The results of significant diagnostics from this hospitalization (including imaging, microbiology, ancillary and laboratory) are listed below for reference.    Significant Diagnostic Studies: Dg Chest 1 View  Result Date: 12/24/2017 CLINICAL DATA:  Recent fall with chest pain, initial encounter EXAM: CHEST  1 VIEW COMPARISON:  09/30/2017 FINDINGS: Cardiac shadow is stable. Bibasilar scarring is noted. Chronic changes about the shoulder joints are seen. No acute bony abnormality is noted. IMPRESSION: Chronic changes without acute abnormality. Electronically Signed   By: MaInez Catalina.D.   On: 12/24/2017 20:39   Dg Elbow Complete Left  Result Date: 12/24/2017 CLINICAL DATA:  Recent blunt trauma to elbow with pain, initial encounter EXAM: LEFT ELBOW - COMPLETE 3+ VIEW COMPARISON:  07/05/2010 FINDINGS: Mild  degenerative changes of the elbow joint are noted. No acute fracture or dislocation is seen. No soft tissue abnormality is noted. IMPRESSION: Degenerative change without acute abnormality. Electronically Signed   By: MaInez Catalina.D.   On: 12/24/2017 20:39   Ct Head Wo Contrast  Result Date: 12/25/2017  CLINICAL DATA:  Fall, altered level of consciousness EXAM: CT HEAD WITHOUT CONTRAST TECHNIQUE: Contiguous axial images were obtained from the base of the skull through the vertex without intravenous contrast. COMPARISON:  CT head dated 10/05/2017 FINDINGS: Brain: No evidence of acute infarction, hemorrhage, hydrocephalus, extra-axial collection or mass lesion/mass effect. Subcortical white matter and periventricular small vessel ischemic changes. Mild cortical atrophy. Vascular: Intracranial atherosclerosis. Skull: Normal. Negative for fracture or focal lesion. Sinuses/Orbits: The visualized paranasal sinuses are essentially clear. The mastoid air cells are unopacified. Other: None. IMPRESSION: No evidence of acute intracranial abnormality. Atrophy with small vessel ischemic changes. Electronically Signed   By: Julian Hy M.D.   On: 12/25/2017 15:45   Ct Chest W Contrast  Result Date: 12/25/2017 CLINICAL DATA:  Fall, altered level of consciousness EXAM: CT CHEST, ABDOMEN, AND PELVIS WITH CONTRAST TECHNIQUE: Multidetector CT imaging of the chest, abdomen and pelvis was performed following the standard protocol during bolus administration of intravenous contrast. CONTRAST:  183m ISOVUE-300 IOPAMIDOL (ISOVUE-300) INJECTION 61% COMPARISON:  CT abdomen/pelvis dated 08/21/2016 FINDINGS: CT CHEST FINDINGS Cardiovascular: No evidence of traumatic aortic injury. Atherosclerotic calcifications the aortic root and arch. The heart is normal in size.  No pericardial effusion. Coronary atherosclerosis of the LAD. Mediastinum/Nodes: No suspicious mediastinal lymphadenopathy. Visualized thyroid is unremarkable.  Lungs/Pleura: Mild scarring/atelectasis in the right middle lobe, lingula, and bilateral lower lobes, chronic. Associated left lower lobe bronchiectasis. No focal consolidation. No suspicious pulmonary nodules. No pleural effusion or pneumothorax. Musculoskeletal: Degenerative changes of the thoracic spine. Severe degenerative changes of the bilateral shoulders. No fracture is seen. CT ABDOMEN PELVIS FINDINGS Hepatobiliary: Liver is within normal limits. Gallbladder is unremarkable. No intrahepatic or extrahepatic ductal dilatation. Pancreas: Within normal limits. Spleen: Within normal limits. Adrenals/Urinary Tract: Adrenal glands are within normal limits. 13 mm interpolar left renal cyst. Right kidney is within normal limits. No hydronephrosis. Bladder is within normal limits. Stomach/Bowel: Stomach is within normal limits. No evidence of bowel obstruction. Normal appendix (series 2/image 37). Vascular/Lymphatic: No evidence of abdominal aortic aneurysm. Atherosclerotic calcifications of the abdominal aorta and branch vessels. No suspicious abdominopelvic lymphadenopathy. Reproductive: Prostate is unremarkable. Other: No abdominopelvic ascites. No hemoperitoneum or free air. Musculoskeletal: Degenerative changes of the lumbar spine. Moderate degenerative changes of the left hip, chronic. No fracture is seen. IMPRESSION: No evidence of traumatic injury to the chest, abdomen, or pelvis. Severe degenerative changes of the bilateral shoulders. Moderate degenerative changes of the left hip, chronic. Additional ancillary findings as above. Electronically Signed   By: SJulian HyM.D.   On: 12/25/2017 15:58   Ct Abdomen Pelvis W Contrast  Result Date: 12/25/2017 CLINICAL DATA:  Fall, altered level of consciousness EXAM: CT CHEST, ABDOMEN, AND PELVIS WITH CONTRAST TECHNIQUE: Multidetector CT imaging of the chest, abdomen and pelvis was performed following the standard protocol during bolus administration of  intravenous contrast. CONTRAST:  1046mISOVUE-300 IOPAMIDOL (ISOVUE-300) INJECTION 61% COMPARISON:  CT abdomen/pelvis dated 08/21/2016 FINDINGS: CT CHEST FINDINGS Cardiovascular: No evidence of traumatic aortic injury. Atherosclerotic calcifications the aortic root and arch. The heart is normal in size.  No pericardial effusion. Coronary atherosclerosis of the LAD. Mediastinum/Nodes: No suspicious mediastinal lymphadenopathy. Visualized thyroid is unremarkable. Lungs/Pleura: Mild scarring/atelectasis in the right middle lobe, lingula, and bilateral lower lobes, chronic. Associated left lower lobe bronchiectasis. No focal consolidation. No suspicious pulmonary nodules. No pleural effusion or pneumothorax. Musculoskeletal: Degenerative changes of the thoracic spine. Severe degenerative changes of the bilateral shoulders. No fracture is seen. CT ABDOMEN PELVIS FINDINGS Hepatobiliary:  Liver is within normal limits. Gallbladder is unremarkable. No intrahepatic or extrahepatic ductal dilatation. Pancreas: Within normal limits. Spleen: Within normal limits. Adrenals/Urinary Tract: Adrenal glands are within normal limits. 13 mm interpolar left renal cyst. Right kidney is within normal limits. No hydronephrosis. Bladder is within normal limits. Stomach/Bowel: Stomach is within normal limits. No evidence of bowel obstruction. Normal appendix (series 2/image 37). Vascular/Lymphatic: No evidence of abdominal aortic aneurysm. Atherosclerotic calcifications of the abdominal aorta and branch vessels. No suspicious abdominopelvic lymphadenopathy. Reproductive: Prostate is unremarkable. Other: No abdominopelvic ascites. No hemoperitoneum or free air. Musculoskeletal: Degenerative changes of the lumbar spine. Moderate degenerative changes of the left hip, chronic. No fracture is seen. IMPRESSION: No evidence of traumatic injury to the chest, abdomen, or pelvis. Severe degenerative changes of the bilateral shoulders. Moderate  degenerative changes of the left hip, chronic. Additional ancillary findings as above. Electronically Signed   By: Julian Hy M.D.   On: 12/25/2017 15:58   Dg Chest Port 1 View  Result Date: 12/25/2017 CLINICAL DATA:  Sepsis. EXAM: PORTABLE CHEST 1 VIEW COMPARISON:  Chest CT 7 hours prior. FINDINGS: Low lung volumes. Heart is enlarged. Streaky bibasilar opacities consistent with atelectasis, similar to prior CT. Bronchovascular crowding versus vascular congestion. No large pleural effusion or pneumothorax. IMPRESSION: Low lung volumes with cardiomegaly and bronchovascular crowding versus vascular congestion. Bibasilar atelectasis. Electronically Signed   By: Jeb Levering M.D.   On: 12/25/2017 22:46   Dg Shoulder Left  Result Date: 12/24/2017 CLINICAL DATA:  Left shoulder injury.  Hit door frame. EXAM: LEFT SHOULDER - 2+ VIEW COMPARISON:  None. FINDINGS: Severe deformity of the left shoulder with flattening of the humeral head and advanced degenerative changes. This is stable since prior chest x-ray. No acute fracture, subluxation or dislocation. IMPRESSION: Severe chronic deformity of the left glenohumeral joint as above with advanced degenerative changes. No acute bony abnormality. Electronically Signed   By: Rolm Baptise M.D.   On: 12/24/2017 20:37   [place] Ekg Site Rite  Result Date: 12/29/2017 If Site Rite image not attached, placement could not be confirmed due to current cardiac rhythm.   Microbiology: Recent Results (from the past 240 hour(s))  Culture, blood (Routine X 2) w Reflex to ID Panel     Status: Abnormal   Collection Time: 12/24/17  9:30 PM  Result Value Ref Range Status   Specimen Description   Final    BLOOD RIGHT HAND Performed at [facility], [address] [place], [place] [address]    Special Requests   Final    BOTTLES DRAWN AEROBIC ONLY Blood Culture results may not be optimal due to an inadequate volume of blood received in culture  bottles Performed at [facility], [address] [place], [place] [address]    Culture  Setup Time   Final    GRAM POSITIVE COCCI AEROBIC BOTTLE ONLY CRITICAL RESULT CALLED TO, READ BACK BY AND VERIFIED WITH: Lavell Luster [facility] 2206 12/25/17 A BROWNING    Culture (A)  Final    STAPHYLOCOCCUS AUREUS SUSCEPTIBILITIES PERFORMED ON PREVIOUS CULTURE WITHIN THE LAST 5 DAYS. Performed at [facility], [name] [address], [place], [place] [address]    Report Status 12/27/2017 FINAL  Final  Culture, blood (Routine X 2) w Reflex to ID Panel     Status: Abnormal   Collection Time: 12/24/17 10:15 PM  Result Value Ref Range Status   Specimen Description   Final  BLOOD RIGHT ANTECUBITAL Performed at [facility], [address] [place], [place] [address]    Special Requests   Final    BOTTLES DRAWN AEROBIC AND ANAEROBIC Blood Culture adequate volume Performed at [facility], [address] [place], [place] [address]    Culture  Setup Time   Final    GRAM POSITIVE COCCI IN BOTH AEROBIC AND ANAEROBIC BOTTLES CRITICAL RESULT CALLED TO, READ BACK BY AND VERIFIED WITHLavell Luster [facility] 2206 12/25/17 A BROWNING Performed at [facility], [place] [address], [place], [place] [address]    Culture STAPHYLOCOCCUS AUREUS (A)  Final   Report Status 12/27/2017 FINAL  Final   Organism ID, Bacteria STAPHYLOCOCCUS AUREUS  Final      Susceptibility   Staphylococcus aureus - MIC*    CIPROFLOXACIN <=0.5 SENSITIVE Sensitive     ERYTHROMYCIN <=0.25 SENSITIVE Sensitive     GENTAMICIN <=0.5 SENSITIVE Sensitive     OXACILLIN 0.5 SENSITIVE Sensitive     TETRACYCLINE <=1 SENSITIVE Sensitive     VANCOMYCIN 1 SENSITIVE Sensitive     TRIMETH/SULFA <=10 SENSITIVE Sensitive     CLINDAMYCIN <=0.25 SENSITIVE Sensitive     RIFAMPIN <=0.5 SENSITIVE Sensitive     Inducible Clindamycin NEGATIVE Sensitive     * STAPHYLOCOCCUS AUREUS  Blood Culture ID Panel (Reflexed)     Status:  Abnormal   Collection Time: 12/24/17 10:15 PM  Result Value Ref Range Status   Enterococcus species NOT DETECTED NOT DETECTED Final   Listeria monocytogenes NOT DETECTED NOT DETECTED Final   Staphylococcus species DETECTED (A) NOT DETECTED Final    Comment: CRITICAL RESULT CALLED TO, READ BACK BY AND VERIFIED WITH: Lavell Luster PHAR[facility] 2206 12/25/17 A BROWNING    Staphylococcus aureus DETECTED (A) NOT DETECTED Final    Comment: Methicillin (oxacillin) susceptible Staphylococcus aureus (MSSA). Preferred therapy is anti staphylococcal beta lactam antibiotic (Cefazolin or Nafcillin), unless clinically contraindicated. CRITICAL RESULT CALLED TO, READ BACK BY AND VERIFIED WITH: Lavell Luster [facility] 2206 12/25/17 A BROWNING    Methicillin resistance NOT DETECTED NOT DETECTED Final   Streptococcus species NOT DETECTED NOT DETECTED Final   Streptococcus agalactiae NOT DETECTED NOT DETECTED Final   Streptococcus pneumoniae NOT DETECTED NOT DETECTED Final   Streptococcus pyogenes NOT DETECTED NOT DETECTED Final   Acinetobacter baumannii NOT DETECTED NOT DETECTED Final   Enterobacteriaceae species NOT DETECTED NOT DETECTED Final   Enterobacter cloacae complex NOT DETECTED NOT DETECTED Final   Escherichia coli NOT DETECTED NOT DETECTED Final   Klebsiella oxytoca NOT DETECTED NOT DETECTED Final   Klebsiella pneumoniae NOT DETECTED NOT DETECTED Final   Proteus species NOT DETECTED NOT DETECTED Final   Serratia marcescens NOT DETECTED NOT DETECTED Final   Haemophilus influenzae NOT DETECTED NOT DETECTED Final   Neisseria meningitidis NOT DETECTED NOT DETECTED Final   Pseudomonas aeruginosa NOT DETECTED NOT DETECTED Final   Candida albicans NOT DETECTED NOT DETECTED Final   Candida glabrata NOT DETECTED NOT DETECTED Final   Candida krusei NOT DETECTED NOT DETECTED Final   Candida parapsilosis NOT DETECTED NOT DETECTED Final   Candida tropicalis NOT DETECTED NOT DETECTED Final    Comment: Performed at [facility], [address] [place], [place] [address]  Blood culture (routine x 2)     Status: Abnormal   Collection Time: 12/25/17  2:36 PM  Result Value Ref Range Status   Specimen Description   Final    BLOOD LEFT ARM  Performed at [facility], [address] [place], [place] [address]    Special Requests   Final    BOTTLES DRAWN AEROBIC AND ANAEROBIC Blood Culture adequate volume Performed at [facility], [address] [place], [place] [address]    Culture  Setup Time   Final    GRAM POSITIVE COCCI IN CLUSTERS AEROBIC BOTTLE ONLY CRITICAL RESULT CALLED TO, READ BACK BY AND VERIFIED WITH: E WILLIAMSON,PHARMD AT 4081 12/29/17 BY L BENFIELD    Culture (A)  Final    STAPHYLOCOCCUS AUREUS SUSCEPTIBILITIES PERFORMED ON PREVIOUS CULTURE WITHIN THE LAST 5 DAYS. Performed at [facility], [place] [address], [place], [place] [address]    Report Status 12/30/2017 FINAL  Final  Blood culture (routine x 2)     Status: None   Collection Time: 12/25/17  2:41 PM  Result Value Ref Range Status   Specimen Description   Final    BLOOD RIGHT HAND Performed at [facility], [address] [place], [place] [address]    Special Requests   Final    BOTTLES DRAWN AEROBIC AND ANAEROBIC Blood Culture adequate volume Performed at [facility], [address] [place], [place] [address]    Culture   Final    NO GROWTH 5 DAYS Performed at [facility], [place] [address], [place], [place] [address]    Report Status 12/30/2017 FINAL  Final  MRSA PCR Screening     Status: Abnormal   Collection Time: 12/25/17 11:31 PM  Result Value Ref Range Status   MRSA by PCR POSITIVE (A) NEGATIVE Final    Comment:        The GeneXpert MRSA Assay (FDA approved for NASAL specimens only), is one component of a comprehensive MRSA colonization surveillance program. It is not intended to diagnose MRSA infection nor to guide or monitor treatment for MRSA  infections. RESULT CALLED TO, READ BACK BY AND VERIFIED WITH: A SAWYERS,RN '@[id number]'$  12/26/17 MKELLY Performed at [facility], [place] [address], [place], [place] [address]   Culture, blood (routine x 2)     Status: None (Preliminary result)   Collection Time: 12/29/17 10:50 AM  Result Value Ref Range Status   Specimen Description   Final    BLOOD RIGHT HAND Performed at [facility] [address] [place], [place] [address]    Special Requests   Final    BOTTLES DRAWN AEROBIC ONLY Blood Culture adequate volume Performed at [facility] [address], [place], [place] [address]    Culture   Final    NO GROWTH 1 DAY Performed at [facility], [place] [address] [place], [place] [address]    Report Status PENDING  Incomplete  Culture, blood (routine x 2)     Status: None (Preliminary result)   Collection Time: 12/29/17 10:50 AM  Result Value Ref Range Status   Specimen Description   Final    BLOOD RIGHT WRIST Performed at [facility] [address], [place], [place] [address]    Special Requests   Final    BOTTLES DRAWN AEROBIC AND ANAEROBIC Blood Culture adequate volume Performed at [facility] [address], [place],  [address]    Culture   Final    NO GROWTH 1 DAY Performed at [facility], [place] [address] [place],  [address]    Report  Status PENDING  Incomplete     Labs: Basic Metabolic Panel: Recent Labs  Lab 12/24/17 2031 12/25/17 1428 12/26/17 0324  NA 137 135 138  K 4.3 4.1 4.0  CL 100* 99* 101  CO2 30 27 27   GLUCOSE 111* 101* 108*  BUN 25* 23* 20  CREATININE 0.70 0.74 0.59*  CALCIUM 9.0 8.7* 8.4*   Liver Function Tests: Recent Labs  Lab 12/25/17 1428 12/26/17 0324  AST 31 37  ALT 17 26  ALKPHOS 64 59  BILITOT 1.1 1.0  PROT 7.3 6.8  ALBUMIN 3.2* 2.9*   No results for input(s): LIPASE, AMYLASE in the last 168 hours. Recent  Labs  Lab 12/26/17 0025  AMMONIA 45*   CBC: Recent Labs  Lab 12/24/17 2031 12/25/17 1428 12/26/17 0324 12/27/17 0658  WBC 12.0* 10.7* 10.2 7.3  NEUTROABS 10.5* 9.0* 9.0*  --   HGB 14.3 13.6 13.0 12.5*  HCT 42.2 39.9 40.2 37.6*  MCV 94.4 93.7 95.0 94.7  PLT 125* 108* 107* 111*   Cardiac Enzymes: No results for input(s): CKTOTAL, CKMB, CKMBINDEX, TROPONINI in the last 168 hours. BNP: BNP (last 3 results) Recent Labs    12/24/17 2031  BNP 266.4*    ProBNP (last 3 results) No results for input(s): PROBNP in the last 8760 hours.  CBG: Recent Labs  Lab 12/30/17 1224 12/30/17 1739 12/31/17 0019 12/31/17 0623 12/31/17 1151  GLUCAP 105* 112* 135* 116* 95       Signed:  Velvet Bathe MD.  [facility] 12/31/2017, 12:30 PM

## 2018-01-01 ENCOUNTER — Telehealth: Payer: Self-pay

## 2018-01-01 DIAGNOSIS — Z7901 Long term (current) use of anticoagulants: Secondary | ICD-10-CM | POA: Diagnosis not present

## 2018-01-01 DIAGNOSIS — A4901 Methicillin susceptible Staphylococcus aureus infection, unspecified site: Secondary | ICD-10-CM | POA: Diagnosis not present

## 2018-01-01 DIAGNOSIS — F259 Schizoaffective disorder, unspecified: Secondary | ICD-10-CM | POA: Diagnosis not present

## 2018-01-01 DIAGNOSIS — I872 Venous insufficiency (chronic) (peripheral): Secondary | ICD-10-CM | POA: Diagnosis not present

## 2018-01-01 DIAGNOSIS — I82402 Acute embolism and thrombosis of unspecified deep veins of left lower extremity: Secondary | ICD-10-CM | POA: Diagnosis not present

## 2018-01-01 DIAGNOSIS — I10 Essential (primary) hypertension: Secondary | ICD-10-CM | POA: Diagnosis not present

## 2018-01-01 DIAGNOSIS — Z9981 Dependence on supplemental oxygen: Secondary | ICD-10-CM | POA: Diagnosis not present

## 2018-01-01 DIAGNOSIS — I350 Nonrheumatic aortic (valve) stenosis: Secondary | ICD-10-CM | POA: Diagnosis not present

## 2018-01-01 DIAGNOSIS — K219 Gastro-esophageal reflux disease without esophagitis: Secondary | ICD-10-CM | POA: Diagnosis not present

## 2018-01-01 DIAGNOSIS — W050XXD Fall from non-moving wheelchair, subsequent encounter: Secondary | ICD-10-CM | POA: Diagnosis not present

## 2018-01-01 DIAGNOSIS — M069 Rheumatoid arthritis, unspecified: Secondary | ICD-10-CM | POA: Diagnosis not present

## 2018-01-01 DIAGNOSIS — Z5181 Encounter for therapeutic drug level monitoring: Secondary | ICD-10-CM | POA: Diagnosis not present

## 2018-01-01 DIAGNOSIS — E785 Hyperlipidemia, unspecified: Secondary | ICD-10-CM | POA: Diagnosis not present

## 2018-01-01 DIAGNOSIS — M1990 Unspecified osteoarthritis, unspecified site: Secondary | ICD-10-CM | POA: Diagnosis not present

## 2018-01-01 DIAGNOSIS — L03115 Cellulitis of right lower limb: Secondary | ICD-10-CM | POA: Diagnosis not present

## 2018-01-01 DIAGNOSIS — D696 Thrombocytopenia, unspecified: Secondary | ICD-10-CM | POA: Diagnosis not present

## 2018-01-01 DIAGNOSIS — Z791 Long term (current) use of non-steroidal anti-inflammatories (NSAID): Secondary | ICD-10-CM | POA: Diagnosis not present

## 2018-01-01 DIAGNOSIS — K5790 Diverticulosis of intestine, part unspecified, without perforation or abscess without bleeding: Secondary | ICD-10-CM | POA: Diagnosis not present

## 2018-01-01 DIAGNOSIS — Z452 Encounter for adjustment and management of vascular access device: Secondary | ICD-10-CM | POA: Diagnosis not present

## 2018-01-01 DIAGNOSIS — F319 Bipolar disorder, unspecified: Secondary | ICD-10-CM | POA: Diagnosis not present

## 2018-01-01 DIAGNOSIS — D649 Anemia, unspecified: Secondary | ICD-10-CM | POA: Diagnosis not present

## 2018-01-01 DIAGNOSIS — G4733 Obstructive sleep apnea (adult) (pediatric): Secondary | ICD-10-CM | POA: Diagnosis not present

## 2018-01-01 DIAGNOSIS — I890 Lymphedema, not elsewhere classified: Secondary | ICD-10-CM | POA: Diagnosis not present

## 2018-01-01 DIAGNOSIS — Z86718 Personal history of other venous thrombosis and embolism: Secondary | ICD-10-CM | POA: Diagnosis not present

## 2018-01-01 DIAGNOSIS — Z96651 Presence of right artificial knee joint: Secondary | ICD-10-CM | POA: Diagnosis not present

## 2018-01-01 MED ORDER — HEPARIN SOD (PORK) LOCK FLUSH 100 UNIT/ML IV SOLN: 250.0000 [IU] | Freq: Every day | INTRAVENOUS | Status: DC

## 2018-01-01 MED ORDER — HEPARIN SOD (PORK) LOCK FLUSH 100 UNIT/ML IV SOLN
250.0000 [IU] | INTRAVENOUS | Status: DC | PRN
  Administered 2018-01-01: 250 [IU]

## 2018-01-01 NOTE — ED Notes (Signed)
Discharge instructions reviewed with pt. Pt verbalized understanding. Pt to follow up with PCP. PT assisted into car by staff.

## 2018-01-01 NOTE — ED Provider Notes (Signed)
[facility] DEPT Provider Note   CSN: [id number] Arrival date & time: 12/31/17  2133     History   Chief Complaint No chief complaint on file.   HPI Shaun Yoder is a 72 y.o. male.  The history is provided by the patient.  He has history of hypertension, hyperlipidemia, chronic venous insufficiency and was discharged from the hospital yesterday after placement of a PICC line to treat Staph aureus bacteremia.  At home today, the PICC line started bleeding.  His wife called to get directions and was advised to apply a gauze.  However, the PICC line bled through the gauze.  They were then directed to come to the ED.  Past Medical History:  Diagnosis Date  . Anemia   . Cancer ([place])    bladder  . Chronic venous insufficiency   . Colitis   . Shaun polyps   . Depression   . Diverticulosis   . DVT of deep femoral vein ([place])   . Gastritis   . GERD (gastroesophageal reflux disease)   . History of rectal polyps   . Hyperlipidemia   . Hypertension   . Iron deficiency   . Low back pain   . Lymphedema of both lower extremities   . OA (osteoarthritis)   . 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 Schizoaffective disorder, bipolar type ([place])   . Sleep apnea   . Varicose vein     Patient Active Problem List   Diagnosis Date Noted  . Bacteremia due to methicillin susceptible Staphylococcus aureus (MSSA) 12/26/2017  . Cellulitis of right leg 12/25/2017  . Sepsis ([place]) 08/28/2017  . Nocturnal hypoxemia 02/11/2017  . Primary osteoarthritis of left hip 12/06/2016  . Chronic right shoulder pain 12/06/2016  . Contracture of joint of right shoulder region 12/06/2016  . Chronic left hip pain 09/12/2016  . Status post bilateral knee replacements 09/12/2016  . Gait disorder 06/03/2016  . Left leg DVT ([place]) 04/04/2016  . Non-pressure chronic ulcer of right ankle with fat layer exposed ([place]) 10/25/2015  . Varicose veins of lower extremities with  ulcer ([place]) 09/07/2015  . Annual physical exam 05/23/2015  . PCP NOTES >>>>>>>>>>>>>>>>> 03/28/2015  . Hypertension 03/28/2015  . Pulmonary nodules 12/09/2014  . Sleep apnea 11/14/2014  . GERD (gastroesophageal reflux disease) 11/14/2014  . Morbid obesity ([place]) 11/14/2014  . Aortic stenosis, severe   . Schizoaffective disorder, bipolar type ([place])   . Rheumatoid arthritis ([place])   . Hyperlipidemia   . Bifasicular block   . Mononeuritis of upper limb 05/20/2013  . Pain in soft tissues of limb 05/20/2013  . 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 Depression 01/13/2013  . Memory loss 01/13/2013    Past Surgical History:  Procedure Laterality Date  . BLADDER SURGERY    . TOTAL KNEE ARTHROPLASTY Bilateral   . VARICOSE VEIN SURGERY          Home Medications    Prior to Admission medications   Medication Sig Start Date End Date Taking? Authorizing Provider  acetaminophen-codeine (TYLENOL #3) 300-30 MG tablet Take 1 tablet by mouth every 4 (four) hours as needed for moderate pain.    [provider]  apixaban (ELIQUIS) 5 MG TABS tablet Take 2.5 mg by mouth 2 (two) times daily.    [provider]  ARIPiprazole (ABILIFY) 30 MG tablet Take 30 mg by mouth at bedtime.      [provider]  aspirin EC 81 MG tablet Take 81 mg  by mouth daily.    [provider]  augmented betamethasone dipropionate (DIPROLENE-AF) 0.05 % cream Apply topically 2 (two) times daily. Apply to the rash of the right side of the face 11/03/17   Shaun Branch, MD  calcium-vitamin D (CALCIUM 500/D) 500-200 MG-UNIT tablet Take 1 tablet by mouth daily with breakfast.     [provider]  ceFAZolin (ANCEF) IVPB Inject 2 g into the vein every 8 (eight) hours. Indication:  MSSA bacteremia Last Day of Therapy:  02/05/2018 Labs - Once weekly:  CBC/D and BMP, Labs - Every other week:  ESR and CRP 12/31/17 02/07/18  Shaun Bathe, MD  divalproex (DEPAKOTE ER) 500 MG 24 hr tablet 3 tabs  at bedtime    [provider]  divalproex (DEPAKOTE) 250 MG DR tablet Take 250 mg by mouth daily with lunch.     [provider]  folic acid (FOLVITE) 1 MG tablet Take 1 mg by mouth daily.      [provider]  furosemide (LASIX) 20 MG tablet Take 1 tablet (20 mg total) by mouth daily. Patient not taking: Reported on 12/25/2017 10/17/17   Shaun Branch, MD  gabapentin (NEURONTIN) 300 MG capsule Take 300 mg by mouth daily. 11/19/17   [provider]  hydrocortisone cream 1 % Apply on affected areas on face and behind ears twice daily for 14 days. Patient not taking: Reported on 12/25/2017 01/01/17   Shaun Pal, DO  ketoconazole (NIZORAL) 2 % shampoo Lather and massage into scalp 2-3 times per week. Leave for 5 minutes before rinsing. 07/07/17   Shaun Branch, MD  Melatonin 3 MG TABS Take 3 mg by mouth at bedtime.    [provider]  mirabegron ER (MYRBETRIQ) 50 MG TB24 tablet Take 50 mg by mouth daily.    [provider]  nystatin (MYCOSTATIN) 100000 UNIT/ML suspension Take 5 mLs (500,000 Units total) by mouth 4 (four) times daily. Patient not taking: Reported on 12/25/2017 11/03/17   Shaun Branch, MD  Omega-3 1000 MG CAPS Take 1 capsule by mouth daily.     [provider]  omeprazole (PRILOSEC) 20 MG capsule Take 20 mg by mouth every morning.     [provider]  oxybutynin (DITROPAN) 5 MG tablet Take 5 mg by mouth 2 (two) times daily.    [provider]  OXYGEN Inhale into the lungs. To use if O2 drops below 85 %    [provider]  predniSONE (DELTASONE) 5 MG tablet Take 5 mg by mouth daily with breakfast.    [provider]  Tofacitinib Citrate (XELJANZ XR) 11 MG TB24 Take 1 tablet by mouth daily.    [provider]  traMADol (ULTRAM) 50 MG tablet Take 1 tablet (50 mg total) by mouth every 6 (six) hours as needed. 11/17/17   Shaun Branch, MD  vitamin C (ASCORBIC ACID) 500 MG tablet Take 500  mg by mouth daily.    [provider]  ziprasidone (GEODON) 80 MG capsule Take 80 mg by mouth daily.    [provider]    Family History Family History  Problem Relation Age of Onset  . Heart attack Mother   . Heart attack Father   . Stroke Father   . Rheumatologic disease Neg Hx   . Shaun cancer Neg Hx   . Lung disease Neg Hx   . Prostate cancer Neg Hx     Social History Social History  Tobacco Use  . Smoking status: Former Smoker    Packs/day: 2.50    Years: 43.00    Pack years: 107.50    Types: Cigarettes    Last attempt to quit: 07/22/1998    Years since quitting: 19.4  . Smokeless tobacco: Never Used  Substance Use Topics  . Alcohol use: Yes    Alcohol/week: 0.0 oz    Comment: Occasional glass of wine  . Drug use: No     Allergies   Leflunomide; Morphine and related; and Plaquenil [hydroxychloroquine sulfate]   Review of Systems Review of Systems  All other systems reviewed and are negative.    Physical Exam Updated Vital Signs BP (!) 155/123 (BP Location: Left Arm)   Pulse 76   Temp 98.7 F (37.1 C)   Resp 20   SpO2 95%   Physical Exam  Nursing note and vitals reviewed.  71 year old male, resting comfortably and in no acute distress. Vital signs are significant for elevated blood pressure. Oxygen saturation is 95%, which is normal. Head is normocephalic and atraumatic. PERRLA, EOMI. Oropharynx is clear. Neck is nontender and supple without adenopathy or JVD. Back is nontender and there is no CVA tenderness. Lungs are clear without rales, wheezes, or rhonchi. Chest is nontender. Heart has regular rate and rhythm with 3/6 holosystolic murmur. Abdomen is soft, flat, nontender without masses or hepatosplenomegaly and peristalsis is normoactive. Extremities have trace edema, full range of motion is present.  Severe venous stasis changes present bilaterally.  PICC line present in the right antecubital space with no bleeding  noted. Skin is warm and dry without rash. Neurologic: Mental status is normal, cranial nerves are intact, there are no motor or sensory deficits.  ED Treatments / Results   Procedures Procedures   Medications Ordered in ED Medications  heparin lock flush 100 unit/mL (has no administration in time range)    And  heparin lock flush 100 unit/mL (250 Units Intracatheter Given 01/01/18 0036)     Initial Impression / Assessment and Plan / ED Course  I have reviewed the triage vital signs and the nursing notes.  PICC line bleeding.  Prior to my seeing the patient, he was evaluated by IV team who noted that the PICC line flushed well, and bleeding had stopped.  No evidence of ongoing bleeding at this point.  Advised to go home to continue his home antibiotics.  He does have a home health visit scheduled for 8:30 AM.  Advised to return should there be further problems with bleeding.  Old records are reviewed, confirming recent hospitalization for staphylococcal bacteremia.  Final Clinical Impressions(s) / ED Diagnoses   Final diagnoses:  Complication associated with peripherally inserted central catheter, initial encounter    ED Discharge Orders    None       Delora Fuel, MD 75/05/10 [phone]

## 2018-01-01 NOTE — Progress Notes (Signed)
Patient presented to ER with bleeding PICC at insertion site. PICC dressing changed, insertion site assessed.  Bleeding from insertion site significantly decreased. New PICC dressing clean, dry and intact. Patient and spouse educated on bleeding at newly placed PICC is very common. Patient scheduled for [facility] visit at home at 0830.

## 2018-01-01 NOTE — Telephone Encounter (Signed)
[facility] follow up call meade. Wife states they were performing a procedure at home and she would call me back.

## 2018-01-01 NOTE — Discharge Instructions (Addendum)
Continue your antibiotics at home. Return if you are having more problems with the PICC line.

## 2018-01-01 NOTE — Telephone Encounter (Signed)
01/01/18   [facility] Care Management Follow-up Telephone Call  ADMISSION DATE: 12/25/2017 DISCHARGE DATE: 12/31/2017   How have you been since you were released from the hospital?  Feeling ok per wife has had problems with pic line and has been to ER for this. Had nurse over this morning.     Do you understand why you were in the hospital? Yes   Do you understand the discharge instrcutions? Yes    Items Reviewed:  Medications reviewed:  Yes Not taking Lasix,no swelling at this time. Stopped Xeljanz XR and Nystatin Suspension    Allergies reviewed: Yes   Dietary changes reviewed: Heart healthy   Referrals reviewed: Hospital follow up appointment scheduled.   Functional Questionnaire: .  Activities of Daily Living (ADLs): Patient has to have help with all  Any patient concerns? Not at this time   Confirmed importance and date/time of follow-up visits scheduled: Yes   Confirmed with patient if condition begins to worsen call PCP or go to the ER. Yes    Patient was given the office number and encouragred to call back with questions or concerns. Yes

## 2018-01-02 ENCOUNTER — Emergency Department (HOSPITAL_COMMUNITY)
Admission: EM | Admit: 2018-01-02 | Discharge: 2018-01-02 | Disposition: A | Discharge status: Home / Self Care | Payer: Medicare Other | Attending: Emergency Medicine | Admitting: Emergency Medicine

## 2018-01-02 ENCOUNTER — Emergency Department (HOSPITAL_COMMUNITY): Payer: Medicare Other

## 2018-01-02 ENCOUNTER — Encounter (HOSPITAL_COMMUNITY): Payer: Self-pay

## 2018-01-02 ENCOUNTER — Other Ambulatory Visit: Payer: Self-pay

## 2018-01-02 DIAGNOSIS — Y828 Other medical devices associated with adverse incidents: Secondary | ICD-10-CM | POA: Diagnosis not present

## 2018-01-02 DIAGNOSIS — Z7982 Long term (current) use of aspirin: Secondary | ICD-10-CM | POA: Insufficient documentation

## 2018-01-02 DIAGNOSIS — Z79899 Other long term (current) drug therapy: Secondary | ICD-10-CM | POA: Insufficient documentation

## 2018-01-02 DIAGNOSIS — Z87891 Personal history of nicotine dependence: Secondary | ICD-10-CM | POA: Diagnosis not present

## 2018-01-02 DIAGNOSIS — T82838A Hemorrhage of vascular prosthetic devices, implants and grafts, initial encounter: Secondary | ICD-10-CM

## 2018-01-02 DIAGNOSIS — Z7901 Long term (current) use of anticoagulants: Secondary | ICD-10-CM | POA: Insufficient documentation

## 2018-01-02 DIAGNOSIS — Z8551 Personal history of malignant neoplasm of bladder: Secondary | ICD-10-CM | POA: Insufficient documentation

## 2018-01-02 DIAGNOSIS — I10 Essential (primary) hypertension: Secondary | ICD-10-CM | POA: Diagnosis not present

## 2018-01-02 DIAGNOSIS — Z96653 Presence of artificial knee joint, bilateral: Secondary | ICD-10-CM | POA: Diagnosis not present

## 2018-01-02 DIAGNOSIS — R918 Other nonspecific abnormal finding of lung field: Secondary | ICD-10-CM | POA: Diagnosis not present

## 2018-01-02 NOTE — ED Notes (Deleted)
Upon discharge with PTAR, PICC line site bleeding. Will contact PICC team to evaluate prior to discharge             

## 2018-01-02 NOTE — ED Notes (Signed)
Bed: WTR6 Expected date:  Expected time:  Means of arrival:  Comments: 

## 2018-01-02 NOTE — Discharge Instructions (Signed)
Get help right away if: °Your PICC is accidentally pulled all the way out. If this happens, cover the insertion site with a bandage or gauze dressing. Do not throw the PICC away. Your health care provider will need to inspect it. °Your PICC was tugged or pulled and has partially come out. Do not  push the PICC back in. °There is any type of drainage, redness, or swelling where the PICC enters the skin. °You cannot flush the PICC, it is difficult to flush, or the PICC leaks around the insertion site when it is flushed. °You hear a "flushing" sound when the PICC is flushed. °You have pain, discomfort, or numbness in your arm, shoulder, or jaw on the same side as the PICC. °You feel your heart "racing" or skipping beats. °You notice a hole or tear in the PICC. °You develop chills or a fever. °

## 2018-01-02 NOTE — Telephone Encounter (Signed)
noted 

## 2018-01-02 NOTE — ED Triage Notes (Signed)
[facility] sent [facility] to the Ed because he has been having bleeding issues x 2 days. Iv Team saw patient in Triage and PICC line flushe without issues,but wanted the patient in a bed prior to dressing change. Patient states he takes Eliquis.

## 2018-01-02 NOTE — ED Provider Notes (Signed)
[facility] DEPT Provider Note   CSN: [id number] Arrival date & time: 01/02/18  1010     History   Chief Complaint Chief Complaint  Patient presents with  . PICC line issues    HPI Shaun Yoder is a 72 y.o. male who presents emergency department for evaluation of bleeding around his PICC line.  He has a past medical history of severe aortic stenosis and recent staph bacteremia.  Patient is also on Eliquis for his severe aortic stenosis.  He has multiple areas of bruising around the body.  He denies pain, fevers or chills.  HPI  Past Medical History:  Diagnosis Date  . Anemia   . Cancer ([place])    bladder  . Chronic venous insufficiency   . Colitis   . Colon polyps   . Depression   . Diverticulosis   . DVT of deep femoral vein ([place])   . Gastritis   . GERD (gastroesophageal reflux disease)   . History of rectal polyps   . Hyperlipidemia   . Hypertension   . Iron deficiency   . Low back pain   . Lymphedema of both lower extremities   . OA (osteoarthritis)   . 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 Schizoaffective disorder, bipolar type ([place])   . Sleep apnea   . Varicose vein     Patient Active Problem List   Diagnosis Date Noted  . Bacteremia due to methicillin susceptible Staphylococcus aureus (MSSA) 12/26/2017  . Cellulitis of right leg 12/25/2017  . Sepsis ([place]) 08/28/2017  . Nocturnal hypoxemia 02/11/2017  . Primary osteoarthritis of left hip 12/06/2016  . Chronic right shoulder pain 12/06/2016  . Contracture of joint of right shoulder region 12/06/2016  . Chronic left hip pain 09/12/2016  . Status post bilateral knee replacements 09/12/2016  . Gait disorder 06/03/2016  . Left leg DVT ([place]) 04/04/2016  . Non-pressure chronic ulcer of right ankle with fat layer exposed ([place]) 10/25/2015  . Varicose veins of lower extremities with ulcer ([place]) 09/07/2015  . Annual physical exam 05/23/2015  . PCP NOTES >>>>>>>>>>>>>>>>>  03/28/2015  . Hypertension 03/28/2015  . Pulmonary nodules 12/09/2014  . Sleep apnea 11/14/2014  . GERD (gastroesophageal reflux disease) 11/14/2014  . Morbid obesity ([place]) 11/14/2014  . Aortic stenosis, severe   . Schizoaffective disorder, bipolar type ([place])   . Rheumatoid arthritis ([place])   . Hyperlipidemia   . Bifasicular block   . Mononeuritis of upper limb 05/20/2013  . Pain in soft tissues of limb 05/20/2013  . 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 Depression 01/13/2013  . Memory loss 01/13/2013    Past Surgical History:  Procedure Laterality Date  . BLADDER SURGERY    . TOTAL KNEE ARTHROPLASTY Bilateral   . VARICOSE VEIN SURGERY          Home Medications    Prior to Admission medications   Medication Sig Start Date End Date Taking? Authorizing Provider  acetaminophen-codeine (TYLENOL #3) 300-30 MG tablet Take 1 tablet by mouth every 4 (four) hours as needed for moderate pain.    [provider]  apixaban (ELIQUIS) 5 MG TABS tablet Take 2.5 mg by mouth 2 (two) times daily.    [provider]  ARIPiprazole (ABILIFY) 30 MG tablet Take 30 mg by mouth at bedtime.      [provider]  aspirin EC 81 MG tablet Take 81 mg by mouth daily.    [provider]  augmented betamethasone dipropionate (DIPROLENE-AF)  0.05 % cream Apply topically 2 (two) times daily. Apply to the rash of the right side of the face 11/03/17   Colon Branch, MD  calcium-vitamin D (CALCIUM 500/D) 500-200 MG-UNIT tablet Take 1 tablet by mouth daily with breakfast.     [provider]  ceFAZolin (ANCEF) IVPB Inject 2 g into the vein every 8 (eight) hours. Indication:  MSSA bacteremia Last Day of Therapy:  02/05/2018 Labs - Once weekly:  CBC/D and BMP, Labs - Every other week:  ESR and CRP 12/31/17 02/07/18  Velvet Bathe, MD  divalproex (DEPAKOTE ER) 500 MG 24 hr tablet 3 tabs at bedtime    [provider]  divalproex (DEPAKOTE) 250 MG DR tablet Take 250 mg  by mouth daily with lunch.     [provider]  folic acid (FOLVITE) 1 MG tablet Take 1 mg by mouth daily.      [provider]  furosemide (LASIX) 20 MG tablet Take 1 tablet (20 mg total) by mouth daily. Patient not taking: Reported on 12/25/2017 10/17/17   Colon Branch, MD  gabapentin (NEURONTIN) 300 MG capsule Take 300 mg by mouth daily. 11/19/17   [provider]  hydrocortisone cream 1 % Apply on affected areas on face and behind ears twice daily for 14 days. Patient not taking: Reported on 12/25/2017 01/01/17   [name], DO  ketoconazole (NIZORAL) 2 % shampoo Lather and massage into scalp 2-3 times per week. Leave for 5 minutes before rinsing. 07/07/17   Colon Branch, MD  Melatonin 3 MG TABS Take 3 mg by mouth at bedtime.    [provider]  mirabegron ER (MYRBETRIQ) 50 MG TB24 tablet Take 50 mg by mouth daily.    [provider]  nystatin (MYCOSTATIN) 100000 UNIT/ML suspension Take 5 mLs (500,000 Units total) by mouth 4 (four) times daily. Patient not taking: Reported on 12/25/2017 11/03/17   Colon Branch, MD  Omega-3 1000 MG CAPS Take 1 capsule by mouth daily.     [provider]  omeprazole (PRILOSEC) 20 MG capsule Take 20 mg by mouth every morning.     [provider]  oxybutynin (DITROPAN) 5 MG tablet Take 5 mg by mouth 2 (two) times daily.    [provider]  OXYGEN Inhale into the lungs. To use if O2 drops below 85 %    [provider]  predniSONE (DELTASONE) 5 MG tablet Take 5 mg by mouth daily with breakfast.    [provider]  Tofacitinib Citrate (XELJANZ XR) 11 MG TB24 Take 1 tablet by mouth daily.    [provider]  traMADol (ULTRAM) 50 MG tablet Take 1 tablet (50 mg total) by mouth every 6 (six) hours as needed. 11/17/17   Colon Branch, MD  vitamin C (ASCORBIC ACID) 500 MG tablet Take 500 mg by mouth daily.    [provider]  ziprasidone (GEODON) 80 MG capsule Take 80  mg by mouth daily.    [provider]    Family History Family History  Problem Relation Age of Onset  . Heart attack Mother   . Heart attack Father   . Stroke Father   . Rheumatologic disease Neg Hx   . Colon cancer Neg Hx   . Lung disease Neg Hx   . Prostate cancer Neg Hx     Social History Social History   Tobacco Use  . Smoking status: Former Smoker    Packs/day: 2.50  Years: 43.00    Pack years: 107.50    Types: Cigarettes    Last attempt to quit: 07/22/1998    Years since quitting: 19.4  . Smokeless tobacco: Never Used  Substance Use Topics  . Alcohol use: Yes    Alcohol/week: 0.0 oz    Comment: Occasional glass of wine  . Drug use: No     Allergies   Leflunomide; Morphine and related; and Plaquenil [hydroxychloroquine sulfate]   Review of Systems Review of Systems  Ten systems reviewed and are negative for acute change, except as noted in the HPI.   Physical Exam Updated Vital Signs BP 112/72 (BP Location: Right Arm)   Pulse 75   Temp 98.9 F (37.2 C) (Oral)   Resp 18   Ht 5' 7"  (1.702 m)   Wt 93.9 kg (207 lb)   SpO2 93%   BMI 32.42 kg/m   Physical Exam  Constitutional: He appears well-developed and well-nourished. No distress.  Peers chronically ill  HENT:  Head: Normocephalic and atraumatic.  Eyes: Conjunctivae are normal. No scleral icterus.  Neck: Normal range of motion. Neck supple.  Cardiovascular: Normal rate, regular rhythm and normal heart sounds.  Pulmonary/Chest: Effort normal and breath sounds normal. No respiratory distress.  Abdominal: Soft. There is no tenderness.  Musculoskeletal: He exhibits edema.  PIC line in the right upper extremity, gauze dressing is soaked with blood Eating edema bilateral lower extremities.  Neurological: He is alert.  Skin: Skin is warm and dry. He is not diaphoretic.  Bruising across the upper chest wall and lower abdomen, bilateral legs.  There is a tense bulla full of blood on the left  outer extremity.  Psychiatric: His behavior is normal.  Nursing note and vitals reviewed.    ED Treatments / Results  Labs (all labs ordered are listed, but only abnormal results are displayed) Labs Reviewed - No data to display  EKG None  Radiology No results found.  Procedures Procedures (including critical care time)  Medications Ordered in ED Medications - No data to display   Initial Impression / Assessment and Plan / ED Course  I have reviewed the triage vital signs and the nursing notes.  Pertinent labs & imaging results that were available during my care of the patient were reviewed by me and considered in my medical decision making (see chart for details).  Clinical Course as of Jan 03 1624  Fri Jan 02, 2018  1351 Picc line team here to see the patient.  Patient PICC line flushed easily upfront however when the PICC line nurse came to change the dressing she noticed that it was about 14 cm out of place.  We have ordered a chest x-ray to confirm placement.   [AH]    Clinical Course User Index [AH] Margarita Mail, PA-C    Since PICC line appears to be in place.  Bleeding resolved and bandage changed.  Patient appears safe to be discharged at this time.  Final Clinical Impressions(s) / ED Diagnoses   Final diagnoses:  Bleeding from peripherally inserted central venous catheter (PICC), initial encounter [facility])    ED Discharge Orders    None       Margarita Mail, PA-C 01/02/18 1626    Nat Christen, MD 01/03/18 [phone]

## 2018-01-03 DIAGNOSIS — I82402 Acute embolism and thrombosis of unspecified deep veins of left lower extremity: Secondary | ICD-10-CM | POA: Diagnosis not present

## 2018-01-03 DIAGNOSIS — A4901 Methicillin susceptible Staphylococcus aureus infection, unspecified site: Secondary | ICD-10-CM | POA: Diagnosis not present

## 2018-01-03 DIAGNOSIS — D696 Thrombocytopenia, unspecified: Secondary | ICD-10-CM | POA: Diagnosis not present

## 2018-01-03 DIAGNOSIS — I350 Nonrheumatic aortic (valve) stenosis: Secondary | ICD-10-CM | POA: Diagnosis not present

## 2018-01-03 DIAGNOSIS — L03115 Cellulitis of right lower limb: Secondary | ICD-10-CM | POA: Diagnosis not present

## 2018-01-03 DIAGNOSIS — M069 Rheumatoid arthritis, unspecified: Secondary | ICD-10-CM | POA: Diagnosis not present

## 2018-01-03 LAB — CULTURE, BLOOD (ROUTINE X 2)
CULTURE: NO GROWTH
CULTURE: NO GROWTH
SPECIAL REQUESTS: ADEQUATE
Special Requests: ADEQUATE

## 2018-01-05 ENCOUNTER — Ambulatory Visit (INDEPENDENT_AMBULATORY_CARE_PROVIDER_SITE_OTHER): Payer: Medicare Other | Admitting: Internal Medicine

## 2018-01-05 ENCOUNTER — Encounter: Payer: Self-pay | Admitting: Internal Medicine

## 2018-01-05 ENCOUNTER — Inpatient Hospital Stay: Payer: Medicare Other | Admitting: Internal Medicine

## 2018-01-05 VITALS — BP 122/84 | HR 58 | Temp 98.3°F | Resp 16 | Ht 61.0 in

## 2018-01-05 DIAGNOSIS — Z09 Encounter for follow-up examination after completed treatment for conditions other than malignant neoplasm: Secondary | ICD-10-CM

## 2018-01-05 DIAGNOSIS — F250 Schizoaffective disorder, bipolar type: Secondary | ICD-10-CM | POA: Diagnosis not present

## 2018-01-05 DIAGNOSIS — R7881 Bacteremia: Secondary | ICD-10-CM

## 2018-01-05 DIAGNOSIS — I872 Venous insufficiency (chronic) (peripheral): Secondary | ICD-10-CM | POA: Diagnosis not present

## 2018-01-05 DIAGNOSIS — L97811 Non-pressure chronic ulcer of other part of right lower leg limited to breakdown of skin: Secondary | ICD-10-CM | POA: Diagnosis not present

## 2018-01-05 DIAGNOSIS — L97221 Non-pressure chronic ulcer of left calf limited to breakdown of skin: Secondary | ICD-10-CM | POA: Diagnosis not present

## 2018-01-05 DIAGNOSIS — L03115 Cellulitis of right lower limb: Secondary | ICD-10-CM

## 2018-01-05 DIAGNOSIS — I87313 Chronic venous hypertension (idiopathic) with ulcer of bilateral lower extremity: Secondary | ICD-10-CM | POA: Diagnosis not present

## 2018-01-05 DIAGNOSIS — B9561 Methicillin susceptible Staphylococcus aureus infection as the cause of diseases classified elsewhere: Secondary | ICD-10-CM

## 2018-01-05 DIAGNOSIS — A419 Sepsis, unspecified organism: Secondary | ICD-10-CM | POA: Diagnosis not present

## 2018-01-05 NOTE — Progress Notes (Addendum)
Subjective:    Patient ID: Shaun Yoder, male    DOB: April 10, 1946, 71 y.o.   MRN: [id number]  DOS:  01/05/2018 Type of visit - description : TCM 7 Interval history:  Admitted to the hospital and discharged 12/31/2017. He was admitted with confusion and fever, he was found to have methicillin susceptible staph aureus bacteremia and sepsis secondary to cellulitis of the right leg. ID was consulted, TTE showed EF of 60%, patient refused TEE and elected to be treated with 6 weeks of antibiotics with weekly CBCs and BMPs. Other medical problems felt to be stable. Since the  discharge, went to the ER once d/t bleeding from the PICC line site.  X-ray showed that the PICC line was in place, bleeding resolved. Since his discharge, he has seen the wound care center in [facility].   Review of Systems He is here with his wife. Good compliance of medication. No fever chills Appetite is very good. The wife reports that he has been extremely demanding, irritable, yelling at her, intolerant.  Past Medical History:  Diagnosis Date  . Anemia   . Cancer ([place])    bladder  . Chronic venous insufficiency   . Colitis   . Colon polyps   . Depression   . Diverticulosis   . DVT of deep femoral vein ([place])   . Gastritis   . GERD (gastroesophageal reflux disease)   . History of rectal polyps   . Hyperlipidemia   . Hypertension   . Iron deficiency   . Low back pain   . Lymphedema of both lower extremities   . OA (osteoarthritis)   . Osteonecrosis of shoulder region ([place])   . Schizoaffective disorder, bipolar type ([place])   . Sleep apnea   . Varicose vein     Past Surgical History:  Procedure Laterality Date  . BLADDER SURGERY    . TOTAL KNEE ARTHROPLASTY Bilateral   . VARICOSE VEIN SURGERY      Social History   Socioeconomic History  . Marital status: Married    Spouse name: Not on file  . Number of children: 2  . Years of education: Not on file  . Highest education level: Not on  file  Occupational History  . Occupation: retired  Scientific laboratory technician  . Financial resource strain: Not on file  . Food insecurity:    Worry: Not on file    Inability: Not on file  . Transportation needs:    Medical: Not on file    Non-medical: Not on file  Tobacco Use  . Smoking status: Former Smoker    Packs/day: 2.50    Years: 43.00    Pack years: 107.50    Types: Cigarettes    Last attempt to quit: 07/22/1998    Years since quitting: 19.4  . Smokeless tobacco: Never Used  Substance and Sexual Activity  . Alcohol use: Yes    Alcohol/week: 0.0 oz    Comment: Occasional glass of wine  . Drug use: No  . Sexual activity: Not on file  Lifestyle  . Physical activity:    Days per week: Not on file    Minutes per session: Not on file  . Stress: Not on file  Relationships  . Social connections:    Talks on phone: Not on file    Gets together: Not on file    Attends religious service: Not on file    Active member of club or organization: Not on file  Attends meetings of clubs or organizations: Not on file    Relationship status: Not on file  . Intimate partner violence:    Fear of current or ex partner: Not on file    Emotionally abused: Not on file    Physically abused: Not on file    Forced sexual activity: Not on file  Other Topics Concern  . Not on file  Social History Narrative   Originally from [place]. Moved to [facility] in July 22, 1985. 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 No known asbestos exposure. 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 Previously worked in receiving at [employer]. 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 No international travel. Has a dog currently. Remote exposure to Cockatiel in a different home. No mold exposure. No hot tub exposure.       Two adopted children      Allergies as of 01/05/2018      Reactions   Leflunomide Other (See Comments)   diarrhea   Morphine And Related  Nausea And Vomiting   Plaquenil [hydroxychloroquine Sulfate]    rash      Medication List        Accurate as of 01/05/18 11:59 PM. Always use your most recent med list.          acetaminophen-codeine 300-30 MG tablet Commonly known as:  TYLENOL #3 Take 1 tablet by mouth every 4 (four) hours as needed for moderate pain.   ARIPiprazole 30 MG tablet Commonly known as:  ABILIFY Take 30 mg by mouth at bedtime.   aspirin EC 81 MG tablet Take 81 mg by mouth daily.   CALCIUM 500/D 500-200 MG-UNIT tablet Generic drug:  calcium-vitamin D Take 1 tablet by mouth daily with breakfast.   ceFAZolin IVPB Commonly known as:  ANCEF Inject 2 g into the vein every 8 (eight) hours. Indication:  MSSA bacteremia Last Day of Therapy:  02/05/2018 Labs - Once weekly:  CBC/D and BMP, Labs - Every other week:  ESR and CRP   divalproex 250 MG DR tablet Commonly known as:  DEPAKOTE Take 250 mg by mouth daily with lunch.   divalproex 500 MG 24 hr tablet Commonly known as:  DEPAKOTE ER Take 1,500 mg by mouth daily. 3 tabs at bedtime   ELIQUIS 5 MG Tabs tablet Generic drug:  apixaban Take 2.5 mg by mouth 2 (two) times daily.   folic acid 1 MG tablet Commonly known as:  FOLVITE Take 1 mg by mouth daily.   furosemide 20 MG tablet Commonly known as:  LASIX Take 1 tablet (20 mg total) by mouth daily.   gabapentin 300 MG capsule Commonly known as:  NEURONTIN Take 300 mg by mouth daily.   ketoconazole 2 % shampoo Commonly known as:  NIZORAL Lather and massage into scalp 2-3 times per week. Leave for 5 minutes before rinsing.   Melatonin 3 MG Tabs Take 3 mg by mouth at bedtime.   mirabegron ER 50 MG Tb24 tablet Commonly known as:  MYRBETRIQ Take 50 mg by mouth daily.   Omega-3 1000 MG Caps Take 1 capsule by mouth daily.   omeprazole 20 MG capsule Commonly known as:  PRILOSEC Take 20 mg by mouth every morning.   oxybutynin 5 MG tablet Commonly known as:  DITROPAN Take 5 mg by  mouth 2 (two) times daily.   OXYGEN Inhale into the lungs. To use if O2 drops below 85 %  predniSONE 5 MG tablet Commonly known as:  DELTASONE Take 5 mg by mouth daily with breakfast.   traMADol 50 MG tablet Commonly known as:  ULTRAM Take 1 tablet (50 mg total) by mouth every 6 (six) hours as needed.   vitamin C 500 MG tablet Commonly known as:  ASCORBIC ACID Take 500 mg by mouth daily.   XELJANZ XR 11 MG Tb24 Generic drug:  Tofacitinib Citrate Take 1 tablet by mouth daily.   ziprasidone 80 MG capsule Commonly known as:  GEODON Take 80 mg by mouth daily.          Objective:   Physical Exam BP 122/84 (BP Location: Left Arm, Patient Position: Sitting, Cuff Size: Normal)   Pulse (!) 58   Temp 98.3 F (36.8 C) (Oral)   Resp 16   Ht _0  (1.549 m)   SpO2 92%   BMI 39.11 kg/m  General:   Well developed, NAD during the visit, I did noted him to be quite irritable  HEENT:  Normocephalic . Face symmetric, atraumatic Lungs:  CTA B Normal respiratory effort, no intercostal retractions, no accessory muscle use. Heart: RRR, significant systolic murmur.  Pretibial areas covered with a [place] Skin: Not pale. Not jaundice Neurologic:  alert & oriented X3.  Speech normal, gait, wheelchair bound essentially. Psych--  Cooperative with normal attention span and concentration. He does seem belligerent when addressing his wife     Assessment & Plan:    Assessment   (new pt, transfer from [facility],02-2015) Hypertension Hyperlipidemia Ao stenosis, severe: Dr Burt Knack MSK: --Seronegative Rheumatoid arthritis-- Dr Lenna Gilford  --DJD-- DR Tamera Punt , dr Ronnie Derby --low back pain d/t DJD, Dr Sherlyn Lick  PSYCH: Schizophrenia, bipolar, depression: Follow-up at the [place] in [facility] GU: Bladder cancer-transitional cell, Dr. Shona Needles GI:   GERD, history of colitis, history of gastritis,  colon polyps- had a virtual cscope 2016, + polyps, declined further eval Pulmomany: Dr Ashok Cordia --Sleep  apnea:  Cpap intolerant --CT nodules f/u 2 pulmonary  --hypoxemia, dx 01-2017, unable to get O2 from medicare, eventually got O2 home-portable ~08-2017 Hematology: L LEG DVT 02-2016 + Lupus anticoagulant Varicose veins: s/p remote surgery, has LE wounds  H/o  iron deficiency anemia [facility], [facility], psych, Ortho, pain mngmt   PLAN  Bacteremia, sepsis, lower extremity cellulitis: After the hospital admission he was discharged home, has a PICC line in place, wife provides 3 doses of antibiotics daily.  Home health nurse visits him twice a week and change his dressing. Due to problems with bleeding, wife is flushing the line with saline, not heparin. He is on aspirin and Eliquis. Check BMP and CBC today and weekly, share results with ID, put ID referral. He was seen today at the wound care center and he was put on Unna boot bilaterally after they open up to blisters on each leg. Patient's wife knows to call me if he is not getting those drawn by the home health agency. HTN: Seems controlled Aortic stenosis: Has not seen cardiology in a while.  He does not have any symptoms but he does not exert is essentially wheelchair-bound. Lupus anticoagulant history of leg DVT: On aspirin and Eliquis.  Had problems with bleeding with a PICC line Schizophrenia, bipolar, depression: Patient behavior has been very difficult, wife reports that he has been very irritable, angry at times, demanding.  I counseled her to the best of my ability.  With his psychiatric history and on multiple medications I recommend mngmt to  be lead by a specialist , he has an appointment to see the [facility] psychiatrist this week however they could also reach out to Dr. Casimiro Needle, see AVS  Advise wife to give [place] a call if something needs to be done urgently. Medication list: Has listed Tylenol 3 and Ultram, takes on or the other, never both Pt elected not to take Lasix but as needed only Morrie Sheldon on hold due to recent sepsis Also  on aspirin and Eliquis, discussed informally with hematology, he is likely benefiting from aspirin thus I'll continue both. RTC 6 weeks   Today, I spent more than  45  min with the patient: >50% of the time counseling regards schizophrenia, bipolar, depression issues, listening to his wife's concerns, reviewing the chart and coordinating his care.

## 2018-01-05 NOTE — Patient Instructions (Addendum)
GO TO THE LAB : Get the blood work     GO TO THE FRONT DESK Schedule your next appointment for a checkup in 6 weeks  you are getting blood work today and you need blood work weekly until you finish antibiotics.  Please be sure that home health agency is doing that.  If not, let me know  We are referring you to the infectious diseases doctor  Please reach out to Dr. Casimiro Needle [facility] [address], [place], [place] [address]   Phone # [phone]

## 2018-01-05 NOTE — Progress Notes (Signed)
Pre visit review using our clinic review tool, if applicable. No additional management support is needed unless otherwise documented below in the visit note. 

## 2018-01-06 ENCOUNTER — Telehealth: Payer: Self-pay | Admitting: Internal Medicine

## 2018-01-06 DIAGNOSIS — I350 Nonrheumatic aortic (valve) stenosis: Secondary | ICD-10-CM | POA: Diagnosis not present

## 2018-01-06 DIAGNOSIS — I82402 Acute embolism and thrombosis of unspecified deep veins of left lower extremity: Secondary | ICD-10-CM | POA: Diagnosis not present

## 2018-01-06 DIAGNOSIS — A4901 Methicillin susceptible Staphylococcus aureus infection, unspecified site: Secondary | ICD-10-CM | POA: Diagnosis not present

## 2018-01-06 DIAGNOSIS — B9561 Methicillin susceptible Staphylococcus aureus infection as the cause of diseases classified elsewhere: Secondary | ICD-10-CM | POA: Diagnosis not present

## 2018-01-06 DIAGNOSIS — L03119 Cellulitis of unspecified part of limb: Secondary | ICD-10-CM | POA: Diagnosis not present

## 2018-01-06 DIAGNOSIS — M069 Rheumatoid arthritis, unspecified: Secondary | ICD-10-CM | POA: Diagnosis not present

## 2018-01-06 DIAGNOSIS — D696 Thrombocytopenia, unspecified: Secondary | ICD-10-CM | POA: Diagnosis not present

## 2018-01-06 DIAGNOSIS — L03115 Cellulitis of right lower limb: Secondary | ICD-10-CM | POA: Diagnosis not present

## 2018-01-06 LAB — BASIC METABOLIC PANEL
BUN: 25 mg/dL — ABNORMAL HIGH (ref 6–23)
CO2: 34 mEq/L — ABNORMAL HIGH (ref 19–32)
Calcium: 8.6 mg/dL (ref 8.4–10.5)
Chloride: 103 mEq/L (ref 96–112)
Creatinine, Ser: 0.67 mg/dL (ref 0.40–1.50)
GFR: 124.08 mL/min (ref >60.00)
GLUCOSE: 120 mg/dL — AB (ref 70–99)
Potassium: 4.4 mEq/L (ref 3.5–5.1)
SODIUM: 143 meq/L (ref 135–145)

## 2018-01-06 LAB — CBC WITH DIFFERENTIAL/PLATELET
BASOS ABS: 0 10*3/uL (ref 0.0–0.1)
Basophils Relative: 0.4 % (ref 0.0–3.0)
Eosinophils Absolute: 0 10*3/uL (ref 0.0–0.7)
Eosinophils Relative: 0.4 % (ref 0.0–5.0)
HCT: 34.4 % — ABNORMAL LOW (ref 39.0–52.0)
Hemoglobin: 11.5 g/dL — ABNORMAL LOW (ref 13.0–17.0)
LYMPHS ABS: 1.9 10*3/uL (ref 0.7–4.0)
Lymphocytes Relative: 22.1 % (ref 12.0–46.0)
MCHC: 33.6 g/dL (ref 30.0–36.0)
MCV: 94.5 fl (ref 78.0–100.0)
MONO ABS: 0.7 10*3/uL (ref 0.1–1.0)
MONOS PCT: 8 % (ref 3.0–12.0)
NEUTROS PCT: 69.1 % (ref 43.0–77.0)
Neutro Abs: 5.8 10*3/uL (ref 1.4–7.7)
Platelets: 158 10*3/uL (ref 150.0–400.0)
RBC: 3.64 Mil/uL — AB (ref 4.22–5.81)
RDW: 17.6 % — ABNORMAL HIGH (ref 11.5–15.5)
WBC: 8.4 10*3/uL (ref 4.0–10.5)

## 2018-01-06 NOTE — Telephone Encounter (Signed)
Copied from [place] [id number]. Topic: Inquiry >> Jan 06, 2018  2:01 PM Lennox Solders wrote: Reason for CRM: jim hoffman PT [facility] is calling and  needing verbal order after evaluation today  and recommend twice a wk for 4 wks for functional mobility and fall risk reduction. Pt had a fall on 01-03-18 no injury reported. Pt would also benefit from speech therapy for cognition and safety to reduce fall risk

## 2018-01-06 NOTE — Telephone Encounter (Signed)
Spoke w/ Clair Gulling- verbal orders given for PT, ST. Informed we saw Pt yesterday for hosp f/u.

## 2018-01-07 DIAGNOSIS — D696 Thrombocytopenia, unspecified: Secondary | ICD-10-CM | POA: Diagnosis not present

## 2018-01-07 DIAGNOSIS — L03115 Cellulitis of right lower limb: Secondary | ICD-10-CM | POA: Diagnosis not present

## 2018-01-07 DIAGNOSIS — A4901 Methicillin susceptible Staphylococcus aureus infection, unspecified site: Secondary | ICD-10-CM | POA: Diagnosis not present

## 2018-01-07 DIAGNOSIS — I350 Nonrheumatic aortic (valve) stenosis: Secondary | ICD-10-CM | POA: Diagnosis not present

## 2018-01-07 DIAGNOSIS — M069 Rheumatoid arthritis, unspecified: Secondary | ICD-10-CM | POA: Diagnosis not present

## 2018-01-07 DIAGNOSIS — I82402 Acute embolism and thrombosis of unspecified deep veins of left lower extremity: Secondary | ICD-10-CM | POA: Diagnosis not present

## 2018-01-08 ENCOUNTER — Telehealth: Payer: Self-pay | Admitting: Internal Medicine

## 2018-01-08 NOTE — Assessment & Plan Note (Addendum)
Bacteremia, sepsis, lower extremity cellulitis: After the hospital admission he was discharged home, has a PICC line in place, wife provides 3 doses of antibiotics daily.  Home health nurse visits him twice a week and change his dressing. Due to problems with bleeding, wife is flushing the line with saline, not heparin. He is on aspirin and Eliquis. Check BMP and CBC today and weekly, share results with ID, put ID referral. He was seen today at the wound care center and he was put on Unna boot bilaterally after they open up to blisters on each leg. Patient's wife knows to call me if he is not getting those drawn by the home health agency. HTN: Seems controlled Aortic stenosis: Has not seen cardiology in a while.  He does not have any symptoms but he does not exert is essentially wheelchair-bound. Lupus anticoagulant history of leg DVT: On aspirin and Eliquis.  Had problems with bleeding with a PICC line Schizophrenia, bipolar, depression: Patient behavior has been very difficult, wife reports that he has been very irritable, angry at times, demanding.  I counseled her to the best of my ability.  With his psychiatric history and on multiple medications I recommend mngmt to be lead by a specialist , he has an appointment to see the [place] psychiatrist this week however they could also reach out to Dr. Casimiro Needle, see AVS  Advise wife to give [place] a call if something needs to be done urgently. Medication list: Has listed Tylenol 3 and Ultram, takes on or the other, never both Pt elected not to take Lasix but as needed only Morrie Sheldon on hold due to recent sepsis Also on aspirin and Eliquis, discussed informally with hematology, he is likely benefiting from aspirin thus I'll continue both. RTC 6 weeks

## 2018-01-08 NOTE — Telephone Encounter (Signed)
Copied from [place] [id number]. Topic: Quick Communication - See Telephone Encounter >> Jan 08, 2018  2:31 PM Rutherford Nail, NT wrote: CRM for notification. See Telephone encounter for: 01/08/18. Manuela Schwartz, occupational therapist with [facility] calling and is requesting verbal orders for the following: Home health Occupational Therapy 1x a week for 1 week 2x a week for 2 weeks 1x a week for 2 weeks.  CB#: [phone]- Confidential voicemail. Can leave a message

## 2018-01-08 NOTE — Telephone Encounter (Signed)
LMOM w/ verbal orders for Manuela Schwartz.

## 2018-01-09 DIAGNOSIS — Z87891 Personal history of nicotine dependence: Secondary | ICD-10-CM | POA: Diagnosis not present

## 2018-01-09 DIAGNOSIS — R05 Cough: Secondary | ICD-10-CM | POA: Diagnosis not present

## 2018-01-09 DIAGNOSIS — Z888 Allergy status to other drugs, medicaments and biological substances status: Secondary | ICD-10-CM | POA: Diagnosis not present

## 2018-01-09 DIAGNOSIS — Z8669 Personal history of other diseases of the nervous system and sense organs: Secondary | ICD-10-CM | POA: Diagnosis not present

## 2018-01-09 DIAGNOSIS — I350 Nonrheumatic aortic (valve) stenosis: Secondary | ICD-10-CM | POA: Diagnosis not present

## 2018-01-09 DIAGNOSIS — Z8739 Personal history of other diseases of the musculoskeletal system and connective tissue: Secondary | ICD-10-CM | POA: Diagnosis not present

## 2018-01-09 DIAGNOSIS — Z885 Allergy status to narcotic agent status: Secondary | ICD-10-CM | POA: Diagnosis not present

## 2018-01-09 DIAGNOSIS — F329 Major depressive disorder, single episode, unspecified: Secondary | ICD-10-CM | POA: Diagnosis not present

## 2018-01-09 DIAGNOSIS — R410 Disorientation, unspecified: Secondary | ICD-10-CM | POA: Diagnosis not present

## 2018-01-09 DIAGNOSIS — R259 Unspecified abnormal involuntary movements: Secondary | ICD-10-CM | POA: Diagnosis not present

## 2018-01-09 DIAGNOSIS — J9811 Atelectasis: Secondary | ICD-10-CM | POA: Diagnosis not present

## 2018-01-09 DIAGNOSIS — R0902 Hypoxemia: Secondary | ICD-10-CM | POA: Diagnosis not present

## 2018-01-09 DIAGNOSIS — I82402 Acute embolism and thrombosis of unspecified deep veins of left lower extremity: Secondary | ICD-10-CM | POA: Diagnosis not present

## 2018-01-09 DIAGNOSIS — L03115 Cellulitis of right lower limb: Secondary | ICD-10-CM | POA: Diagnosis not present

## 2018-01-09 DIAGNOSIS — F250 Schizoaffective disorder, bipolar type: Secondary | ICD-10-CM | POA: Diagnosis not present

## 2018-01-09 DIAGNOSIS — I10 Essential (primary) hypertension: Secondary | ICD-10-CM | POA: Diagnosis not present

## 2018-01-09 DIAGNOSIS — G629 Polyneuropathy, unspecified: Secondary | ICD-10-CM | POA: Diagnosis not present

## 2018-01-09 DIAGNOSIS — R7881 Bacteremia: Secondary | ICD-10-CM | POA: Diagnosis not present

## 2018-01-09 DIAGNOSIS — A4901 Methicillin susceptible Staphylococcus aureus infection, unspecified site: Secondary | ICD-10-CM | POA: Diagnosis not present

## 2018-01-09 DIAGNOSIS — Z7982 Long term (current) use of aspirin: Secondary | ICD-10-CM | POA: Diagnosis not present

## 2018-01-09 DIAGNOSIS — M069 Rheumatoid arthritis, unspecified: Secondary | ICD-10-CM | POA: Diagnosis not present

## 2018-01-09 DIAGNOSIS — F259 Schizoaffective disorder, unspecified: Secondary | ICD-10-CM | POA: Diagnosis not present

## 2018-01-09 DIAGNOSIS — Z8551 Personal history of malignant neoplasm of bladder: Secondary | ICD-10-CM | POA: Diagnosis not present

## 2018-01-09 DIAGNOSIS — Z79899 Other long term (current) drug therapy: Secondary | ICD-10-CM | POA: Diagnosis not present

## 2018-01-09 DIAGNOSIS — D696 Thrombocytopenia, unspecified: Secondary | ICD-10-CM | POA: Diagnosis not present

## 2018-01-10 DIAGNOSIS — Z046 Encounter for general psychiatric examination, requested by authority: Secondary | ICD-10-CM | POA: Diagnosis not present

## 2018-01-10 DIAGNOSIS — R4182 Altered mental status, unspecified: Secondary | ICD-10-CM | POA: Diagnosis not present

## 2018-01-10 DIAGNOSIS — R269 Unspecified abnormalities of gait and mobility: Secondary | ICD-10-CM | POA: Diagnosis not present

## 2018-01-10 DIAGNOSIS — F250 Schizoaffective disorder, bipolar type: Secondary | ICD-10-CM | POA: Diagnosis not present

## 2018-01-12 DIAGNOSIS — A4901 Methicillin susceptible Staphylococcus aureus infection, unspecified site: Secondary | ICD-10-CM | POA: Diagnosis not present

## 2018-01-12 DIAGNOSIS — I350 Nonrheumatic aortic (valve) stenosis: Secondary | ICD-10-CM | POA: Diagnosis not present

## 2018-01-12 DIAGNOSIS — I82402 Acute embolism and thrombosis of unspecified deep veins of left lower extremity: Secondary | ICD-10-CM | POA: Diagnosis not present

## 2018-01-12 DIAGNOSIS — M069 Rheumatoid arthritis, unspecified: Secondary | ICD-10-CM | POA: Diagnosis not present

## 2018-01-12 DIAGNOSIS — L03115 Cellulitis of right lower limb: Secondary | ICD-10-CM | POA: Diagnosis not present

## 2018-01-12 DIAGNOSIS — D696 Thrombocytopenia, unspecified: Secondary | ICD-10-CM | POA: Diagnosis not present

## 2018-01-13 ENCOUNTER — Telehealth: Payer: Self-pay

## 2018-01-13 ENCOUNTER — Telehealth: Payer: Self-pay | Admitting: *Deleted

## 2018-01-13 NOTE — Telephone Encounter (Signed)
Stationary commode orders received from [facility]- forms signed and faxed to [phone]. Form sent for scanning.

## 2018-01-13 NOTE — Telephone Encounter (Signed)
Received Physician Orders from [facility]; forwarded to provider/SLS 06/25

## 2018-01-14 ENCOUNTER — Telehealth: Payer: Self-pay | Admitting: *Deleted

## 2018-01-14 NOTE — Telephone Encounter (Signed)
Orders signed and faxed to [facility] at [phone]. Form sent for scanning. Fax confirmation received.

## 2018-01-14 NOTE — Telephone Encounter (Signed)
Received Physician Orders [x2, with several duplicates discarded] from [facility]; forwarded to provider/SLS 06/26

## 2018-01-16 ENCOUNTER — Other Ambulatory Visit: Payer: Self-pay | Admitting: Pharmacist

## 2018-01-16 NOTE — Progress Notes (Signed)
[facility] pharmacy lab review  

## 2018-01-17 ENCOUNTER — Other Ambulatory Visit: Payer: Self-pay | Admitting: Internal Medicine

## 2018-01-19 ENCOUNTER — Telehealth: Payer: Self-pay | Admitting: Pharmacist

## 2018-01-19 DIAGNOSIS — L97221 Non-pressure chronic ulcer of left calf limited to breakdown of skin: Secondary | ICD-10-CM | POA: Diagnosis not present

## 2018-01-19 DIAGNOSIS — L97822 Non-pressure chronic ulcer of other part of left lower leg with fat layer exposed: Secondary | ICD-10-CM | POA: Diagnosis not present

## 2018-01-19 DIAGNOSIS — L97811 Non-pressure chronic ulcer of other part of right lower leg limited to breakdown of skin: Secondary | ICD-10-CM | POA: Diagnosis not present

## 2018-01-19 DIAGNOSIS — I87313 Chronic venous hypertension (idiopathic) with ulcer of bilateral lower extremity: Secondary | ICD-10-CM | POA: Diagnosis not present

## 2018-01-19 DIAGNOSIS — I872 Venous insufficiency (chronic) (peripheral): Secondary | ICD-10-CM | POA: Diagnosis not present

## 2018-01-19 DIAGNOSIS — I890 Lymphedema, not elsewhere classified: Secondary | ICD-10-CM | POA: Diagnosis not present

## 2018-01-19 NOTE — Telephone Encounter (Signed)
Orders signed [x2]- forms faxed to [facility] at [phone]. Forms sent for scanning.

## 2018-01-19 NOTE — Telephone Encounter (Signed)
Rx sent 

## 2018-01-19 NOTE — Telephone Encounter (Signed)
Coretta from [facility] called about patient's IV antibiotics and home health orders.  She stated that patient went into the [place] system over the weekend for some mental health issues and his discharge summary said to "resume home health orders". Coretta [facility] been [facility] to [facility] but was wondering if she could get them from Dr. Megan Salon.  Per Dr. Hale Bogus last note in the hospital on 6/11, patient was to continue IV cefazolin for MSSA bacteremia/cellulitis +/- endocarditis until 7/18 when he sees Dr. Megan Salon in clinic.  I told Coretta to go ahead and resume orders from Dr. Megan Salon and to continue the cefazolin until 7/18. She verbalized understanding.

## 2018-01-19 NOTE — Telephone Encounter (Signed)
Ok RF X1

## 2018-01-19 NOTE — Telephone Encounter (Signed)
Pt requesting refill on betamethasone cream. No longer on med list. Please advise.

## 2018-01-20 DIAGNOSIS — I350 Nonrheumatic aortic (valve) stenosis: Secondary | ICD-10-CM | POA: Diagnosis not present

## 2018-01-20 DIAGNOSIS — I82402 Acute embolism and thrombosis of unspecified deep veins of left lower extremity: Secondary | ICD-10-CM | POA: Diagnosis not present

## 2018-01-20 DIAGNOSIS — M069 Rheumatoid arthritis, unspecified: Secondary | ICD-10-CM | POA: Diagnosis not present

## 2018-01-20 DIAGNOSIS — L03115 Cellulitis of right lower limb: Secondary | ICD-10-CM | POA: Diagnosis not present

## 2018-01-20 DIAGNOSIS — D696 Thrombocytopenia, unspecified: Secondary | ICD-10-CM | POA: Diagnosis not present

## 2018-01-20 DIAGNOSIS — A4901 Methicillin susceptible Staphylococcus aureus infection, unspecified site: Secondary | ICD-10-CM | POA: Diagnosis not present

## 2018-01-21 ENCOUNTER — Other Ambulatory Visit: Payer: Self-pay | Admitting: Pharmacist

## 2018-01-21 ENCOUNTER — Telehealth: Payer: Self-pay | Admitting: Internal Medicine

## 2018-01-21 DIAGNOSIS — I82402 Acute embolism and thrombosis of unspecified deep veins of left lower extremity: Secondary | ICD-10-CM | POA: Diagnosis not present

## 2018-01-21 DIAGNOSIS — M069 Rheumatoid arthritis, unspecified: Secondary | ICD-10-CM | POA: Diagnosis not present

## 2018-01-21 DIAGNOSIS — A4901 Methicillin susceptible Staphylococcus aureus infection, unspecified site: Secondary | ICD-10-CM | POA: Diagnosis not present

## 2018-01-21 DIAGNOSIS — D696 Thrombocytopenia, unspecified: Secondary | ICD-10-CM | POA: Diagnosis not present

## 2018-01-21 DIAGNOSIS — L03115 Cellulitis of right lower limb: Secondary | ICD-10-CM | POA: Diagnosis not present

## 2018-01-21 DIAGNOSIS — I350 Nonrheumatic aortic (valve) stenosis: Secondary | ICD-10-CM | POA: Diagnosis not present

## 2018-01-21 NOTE — Telephone Encounter (Signed)
Spoke w/ Jim- verbal orders given.  

## 2018-01-21 NOTE — Progress Notes (Signed)
[facility] pharmacy lab review  

## 2018-01-21 NOTE — Telephone Encounter (Signed)
Copied from [place] [id number]. Topic: General - Other >> Jan 21, 2018 10:42 AM Valla Leaver wrote: Reason for CRM: Clair Gulling, PT with [facility] callin for PT orders with frequency 2x a wk for 4 wks for functional mobility and fall risk reduction.

## 2018-01-22 DIAGNOSIS — M069 Rheumatoid arthritis, unspecified: Secondary | ICD-10-CM | POA: Diagnosis not present

## 2018-01-22 DIAGNOSIS — I350 Nonrheumatic aortic (valve) stenosis: Secondary | ICD-10-CM | POA: Diagnosis not present

## 2018-01-22 DIAGNOSIS — I82402 Acute embolism and thrombosis of unspecified deep veins of left lower extremity: Secondary | ICD-10-CM | POA: Diagnosis not present

## 2018-01-22 DIAGNOSIS — L03115 Cellulitis of right lower limb: Secondary | ICD-10-CM | POA: Diagnosis not present

## 2018-01-22 DIAGNOSIS — D696 Thrombocytopenia, unspecified: Secondary | ICD-10-CM | POA: Diagnosis not present

## 2018-01-22 DIAGNOSIS — A4901 Methicillin susceptible Staphylococcus aureus infection, unspecified site: Secondary | ICD-10-CM | POA: Diagnosis not present

## 2018-01-23 ENCOUNTER — Telehealth: Payer: Self-pay | Admitting: Internal Medicine

## 2018-01-23 ENCOUNTER — Encounter: Payer: Self-pay | Admitting: Family

## 2018-01-23 ENCOUNTER — Ambulatory Visit (INDEPENDENT_AMBULATORY_CARE_PROVIDER_SITE_OTHER): Payer: Medicare Other | Admitting: Family

## 2018-01-23 ENCOUNTER — Telehealth: Payer: Self-pay

## 2018-01-23 ENCOUNTER — Telehealth: Payer: Self-pay | Admitting: Family

## 2018-01-23 VITALS — BP 104/53 | HR 69 | Temp 98.3°F | Resp 18

## 2018-01-23 DIAGNOSIS — L270 Generalized skin eruption due to drugs and medicaments taken internally: Secondary | ICD-10-CM | POA: Diagnosis not present

## 2018-01-23 MED ORDER — DOXYCYCLINE HYCLATE 100 MG PO TABS: 100.0000 mg | ORAL_TABLET | Freq: Two times a day (BID) | ORAL | 0 refills | Status: DC
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
## 2018-01-23 NOTE — Telephone Encounter (Signed)
Left message with [facility] to discuss further instructions for patients PICC line. If [facility] does call back I or another cma at this office would need to speak with them about this but it is [facility] for pec to discuss medication sent in to patient's wife if she calls back.

## 2018-01-23 NOTE — Patient Instructions (Signed)
Stop (IV antibiotics). Call if fever, worsening rash.  Keep your upcoming appointment with Dr. Megan Salon.

## 2018-01-23 NOTE — Telephone Encounter (Signed)
Please call [facility] and let pt's home health RN "Chong Sicilian" know that we are discontinuing his Ancef due to drug rash. Please remove PICC line.    Contact wife and let her know that I spoke with Dr. Megan Salon and he agrees with plan. He does recommend that the patient take an additional 2 weeks of oral doxy.  I have sent this to his pharmacy.

## 2018-01-23 NOTE — Telephone Encounter (Signed)
Left message on patients phone to return call. [place] for [employer] to discuss.

## 2018-01-23 NOTE — Telephone Encounter (Signed)
Copied from [place] [phone]. Topic: Quick Communication - See Telephone Encounter >> Jan 23, 2018  3:56 PM Antonieta Iba C wrote: CRM for notification. See Telephone encounter for: 01/23/18.   IPJRPZP with [facility] called in to make provider aware that they went out per order to remove pt's pickline and pt was not home. They will try back again at another time.   [phone]

## 2018-01-23 NOTE — Telephone Encounter (Signed)
Spoke with Manuela Schwartz at [facility]. She was going to relay message to [place] to have his PICC line removed. She gave me Deatra Ina (his home health nurse) phone number which is [phone]. Will call patty to make sure it was removed.

## 2018-01-23 NOTE — Telephone Encounter (Signed)
Patient developed rash while on cefazolin. To be treated through 7/18.  Spoke with [facility] pharmacist. Plan to change to vancomycin for remaining course  To start on vancomycin on 7/8 or 7/9.

## 2018-01-26 DIAGNOSIS — L97221 Non-pressure chronic ulcer of left calf limited to breakdown of skin: Secondary | ICD-10-CM | POA: Diagnosis not present

## 2018-01-26 DIAGNOSIS — L97811 Non-pressure chronic ulcer of other part of right lower leg limited to breakdown of skin: Secondary | ICD-10-CM | POA: Diagnosis not present

## 2018-01-26 DIAGNOSIS — I87312 Chronic venous hypertension (idiopathic) with ulcer of left lower extremity: Secondary | ICD-10-CM | POA: Diagnosis not present

## 2018-01-26 DIAGNOSIS — I872 Venous insufficiency (chronic) (peripheral): Secondary | ICD-10-CM | POA: Diagnosis not present

## 2018-01-26 DIAGNOSIS — I890 Lymphedema, not elsewhere classified: Secondary | ICD-10-CM | POA: Diagnosis not present

## 2018-01-27 DIAGNOSIS — M069 Rheumatoid arthritis, unspecified: Secondary | ICD-10-CM | POA: Diagnosis not present

## 2018-01-27 DIAGNOSIS — I350 Nonrheumatic aortic (valve) stenosis: Secondary | ICD-10-CM | POA: Diagnosis not present

## 2018-01-27 DIAGNOSIS — L03115 Cellulitis of right lower limb: Secondary | ICD-10-CM | POA: Diagnosis not present

## 2018-01-27 DIAGNOSIS — A4901 Methicillin susceptible Staphylococcus aureus infection, unspecified site: Secondary | ICD-10-CM | POA: Diagnosis not present

## 2018-01-27 DIAGNOSIS — D696 Thrombocytopenia, unspecified: Secondary | ICD-10-CM | POA: Diagnosis not present

## 2018-01-27 DIAGNOSIS — I82402 Acute embolism and thrombosis of unspecified deep veins of left lower extremity: Secondary | ICD-10-CM | POA: Diagnosis not present

## 2018-01-27 NOTE — Telephone Encounter (Signed)
noted 

## 2018-01-27 NOTE — Telephone Encounter (Signed)
Noted, thank you

## 2018-01-27 NOTE — Telephone Encounter (Signed)
Patient's wife Janae Bridgeman called stating the [facility] line will not be removed at this time and the patient is being treated with vancomycin for an infection. [facility] is overseeing this and will re-visit changing the pic line at a later time.

## 2018-01-27 NOTE — Telephone Encounter (Signed)
Spoke with Shaun Yoder. She states they have been in contact with Dr. Bridget Hartshorn with infectious disease and her Dr. Bridget Hartshorn he did not want the PICC line removed. Rather than removing it they switched medication to vancomycin. Nurse is at house now giving patient his first dose. She states the rash is resolved much less on his legs.

## 2018-01-28 DIAGNOSIS — L03115 Cellulitis of right lower limb: Secondary | ICD-10-CM | POA: Diagnosis not present

## 2018-01-28 DIAGNOSIS — M069 Rheumatoid arthritis, unspecified: Secondary | ICD-10-CM | POA: Diagnosis not present

## 2018-01-28 DIAGNOSIS — I350 Nonrheumatic aortic (valve) stenosis: Secondary | ICD-10-CM | POA: Diagnosis not present

## 2018-01-28 DIAGNOSIS — I82402 Acute embolism and thrombosis of unspecified deep veins of left lower extremity: Secondary | ICD-10-CM | POA: Diagnosis not present

## 2018-01-28 DIAGNOSIS — D696 Thrombocytopenia, unspecified: Secondary | ICD-10-CM | POA: Diagnosis not present

## 2018-01-28 DIAGNOSIS — A4901 Methicillin susceptible Staphylococcus aureus infection, unspecified site: Secondary | ICD-10-CM | POA: Diagnosis not present

## 2018-01-29 ENCOUNTER — Telehealth: Payer: Self-pay | Admitting: *Deleted

## 2018-01-29 DIAGNOSIS — D696 Thrombocytopenia, unspecified: Secondary | ICD-10-CM | POA: Diagnosis not present

## 2018-01-29 DIAGNOSIS — I350 Nonrheumatic aortic (valve) stenosis: Secondary | ICD-10-CM | POA: Diagnosis not present

## 2018-01-29 DIAGNOSIS — M069 Rheumatoid arthritis, unspecified: Secondary | ICD-10-CM | POA: Diagnosis not present

## 2018-01-29 DIAGNOSIS — A4901 Methicillin susceptible Staphylococcus aureus infection, unspecified site: Secondary | ICD-10-CM | POA: Diagnosis not present

## 2018-01-29 DIAGNOSIS — I82402 Acute embolism and thrombosis of unspecified deep veins of left lower extremity: Secondary | ICD-10-CM | POA: Diagnosis not present

## 2018-01-29 DIAGNOSIS — L03115 Cellulitis of right lower limb: Secondary | ICD-10-CM | POA: Diagnosis not present

## 2018-01-29 NOTE — Telephone Encounter (Signed)
Received Physician Orders from [facility]; forwarded to provider/SLS 07/11

## 2018-01-30 ENCOUNTER — Other Ambulatory Visit: Payer: Self-pay | Admitting: Pharmacist

## 2018-01-30 DIAGNOSIS — L03115 Cellulitis of right lower limb: Secondary | ICD-10-CM | POA: Diagnosis not present

## 2018-01-30 DIAGNOSIS — I350 Nonrheumatic aortic (valve) stenosis: Secondary | ICD-10-CM | POA: Diagnosis not present

## 2018-01-30 DIAGNOSIS — M069 Rheumatoid arthritis, unspecified: Secondary | ICD-10-CM | POA: Diagnosis not present

## 2018-01-30 DIAGNOSIS — D696 Thrombocytopenia, unspecified: Secondary | ICD-10-CM | POA: Diagnosis not present

## 2018-01-30 DIAGNOSIS — I82402 Acute embolism and thrombosis of unspecified deep veins of left lower extremity: Secondary | ICD-10-CM | POA: Diagnosis not present

## 2018-01-30 DIAGNOSIS — A4901 Methicillin susceptible Staphylococcus aureus infection, unspecified site: Secondary | ICD-10-CM | POA: Diagnosis not present

## 2018-01-30 NOTE — Progress Notes (Signed)
[facility] pharmacy lab review  

## 2018-01-30 NOTE — Telephone Encounter (Signed)
Form completed and faxed to [facility] at [phone]. Form sent for scanning.

## 2018-02-02 DIAGNOSIS — D696 Thrombocytopenia, unspecified: Secondary | ICD-10-CM | POA: Diagnosis not present

## 2018-02-02 DIAGNOSIS — I87312 Chronic venous hypertension (idiopathic) with ulcer of left lower extremity: Secondary | ICD-10-CM | POA: Diagnosis not present

## 2018-02-02 DIAGNOSIS — M069 Rheumatoid arthritis, unspecified: Secondary | ICD-10-CM | POA: Diagnosis not present

## 2018-02-02 DIAGNOSIS — L97221 Non-pressure chronic ulcer of left calf limited to breakdown of skin: Secondary | ICD-10-CM | POA: Diagnosis not present

## 2018-02-02 DIAGNOSIS — L97811 Non-pressure chronic ulcer of other part of right lower leg limited to breakdown of skin: Secondary | ICD-10-CM | POA: Diagnosis not present

## 2018-02-02 DIAGNOSIS — I82402 Acute embolism and thrombosis of unspecified deep veins of left lower extremity: Secondary | ICD-10-CM | POA: Diagnosis not present

## 2018-02-02 DIAGNOSIS — I872 Venous insufficiency (chronic) (peripheral): Secondary | ICD-10-CM | POA: Diagnosis not present

## 2018-02-02 DIAGNOSIS — L03115 Cellulitis of right lower limb: Secondary | ICD-10-CM | POA: Diagnosis not present

## 2018-02-02 DIAGNOSIS — A4901 Methicillin susceptible Staphylococcus aureus infection, unspecified site: Secondary | ICD-10-CM | POA: Diagnosis not present

## 2018-02-02 DIAGNOSIS — I350 Nonrheumatic aortic (valve) stenosis: Secondary | ICD-10-CM | POA: Diagnosis not present

## 2018-02-03 DIAGNOSIS — I350 Nonrheumatic aortic (valve) stenosis: Secondary | ICD-10-CM | POA: Diagnosis not present

## 2018-02-03 DIAGNOSIS — I82402 Acute embolism and thrombosis of unspecified deep veins of left lower extremity: Secondary | ICD-10-CM | POA: Diagnosis not present

## 2018-02-03 DIAGNOSIS — M069 Rheumatoid arthritis, unspecified: Secondary | ICD-10-CM | POA: Diagnosis not present

## 2018-02-03 DIAGNOSIS — A4901 Methicillin susceptible Staphylococcus aureus infection, unspecified site: Secondary | ICD-10-CM | POA: Diagnosis not present

## 2018-02-03 DIAGNOSIS — D696 Thrombocytopenia, unspecified: Secondary | ICD-10-CM | POA: Diagnosis not present

## 2018-02-03 DIAGNOSIS — L03115 Cellulitis of right lower limb: Secondary | ICD-10-CM | POA: Diagnosis not present

## 2018-02-05 ENCOUNTER — Inpatient Hospital Stay: Payer: Medicare Other | Admitting: Internal Medicine

## 2018-02-05 DIAGNOSIS — I350 Nonrheumatic aortic (valve) stenosis: Secondary | ICD-10-CM | POA: Diagnosis not present

## 2018-02-05 DIAGNOSIS — M069 Rheumatoid arthritis, unspecified: Secondary | ICD-10-CM | POA: Diagnosis not present

## 2018-02-05 DIAGNOSIS — D696 Thrombocytopenia, unspecified: Secondary | ICD-10-CM | POA: Diagnosis not present

## 2018-02-05 DIAGNOSIS — A4901 Methicillin susceptible Staphylococcus aureus infection, unspecified site: Secondary | ICD-10-CM | POA: Diagnosis not present

## 2018-02-05 DIAGNOSIS — I82402 Acute embolism and thrombosis of unspecified deep veins of left lower extremity: Secondary | ICD-10-CM | POA: Diagnosis not present

## 2018-02-05 DIAGNOSIS — L03115 Cellulitis of right lower limb: Secondary | ICD-10-CM | POA: Diagnosis not present

## 2018-02-06 ENCOUNTER — Other Ambulatory Visit: Payer: Self-pay | Admitting: Pharmacist

## 2018-02-06 DIAGNOSIS — I82402 Acute embolism and thrombosis of unspecified deep veins of left lower extremity: Secondary | ICD-10-CM | POA: Diagnosis not present

## 2018-02-06 DIAGNOSIS — D696 Thrombocytopenia, unspecified: Secondary | ICD-10-CM | POA: Diagnosis not present

## 2018-02-06 DIAGNOSIS — A4901 Methicillin susceptible Staphylococcus aureus infection, unspecified site: Secondary | ICD-10-CM | POA: Diagnosis not present

## 2018-02-06 DIAGNOSIS — I350 Nonrheumatic aortic (valve) stenosis: Secondary | ICD-10-CM | POA: Diagnosis not present

## 2018-02-06 DIAGNOSIS — M069 Rheumatoid arthritis, unspecified: Secondary | ICD-10-CM | POA: Diagnosis not present

## 2018-02-06 DIAGNOSIS — L03115 Cellulitis of right lower limb: Secondary | ICD-10-CM | POA: Diagnosis not present

## 2018-02-06 NOTE — Progress Notes (Signed)
[facility] pharmacy lab review  

## 2018-02-09 ENCOUNTER — Ambulatory Visit: Payer: Medicare Other | Admitting: Internal Medicine

## 2018-02-09 DIAGNOSIS — L97222 Non-pressure chronic ulcer of left calf with fat layer exposed: Secondary | ICD-10-CM | POA: Diagnosis not present

## 2018-02-09 DIAGNOSIS — A4901 Methicillin susceptible Staphylococcus aureus infection, unspecified site: Secondary | ICD-10-CM | POA: Diagnosis not present

## 2018-02-09 DIAGNOSIS — S81001D Unspecified open wound, right knee, subsequent encounter: Secondary | ICD-10-CM | POA: Diagnosis not present

## 2018-02-09 DIAGNOSIS — L03115 Cellulitis of right lower limb: Secondary | ICD-10-CM | POA: Diagnosis not present

## 2018-02-09 DIAGNOSIS — L97221 Non-pressure chronic ulcer of left calf limited to breakdown of skin: Secondary | ICD-10-CM | POA: Diagnosis not present

## 2018-02-09 DIAGNOSIS — I890 Lymphedema, not elsewhere classified: Secondary | ICD-10-CM | POA: Diagnosis not present

## 2018-02-09 DIAGNOSIS — D696 Thrombocytopenia, unspecified: Secondary | ICD-10-CM | POA: Diagnosis not present

## 2018-02-09 DIAGNOSIS — I82402 Acute embolism and thrombosis of unspecified deep veins of left lower extremity: Secondary | ICD-10-CM | POA: Diagnosis not present

## 2018-02-09 DIAGNOSIS — M069 Rheumatoid arthritis, unspecified: Secondary | ICD-10-CM | POA: Diagnosis not present

## 2018-02-09 DIAGNOSIS — I872 Venous insufficiency (chronic) (peripheral): Secondary | ICD-10-CM | POA: Diagnosis not present

## 2018-02-09 DIAGNOSIS — I350 Nonrheumatic aortic (valve) stenosis: Secondary | ICD-10-CM | POA: Diagnosis not present

## 2018-02-10 DIAGNOSIS — I350 Nonrheumatic aortic (valve) stenosis: Secondary | ICD-10-CM | POA: Diagnosis not present

## 2018-02-10 DIAGNOSIS — I82402 Acute embolism and thrombosis of unspecified deep veins of left lower extremity: Secondary | ICD-10-CM | POA: Diagnosis not present

## 2018-02-10 DIAGNOSIS — D696 Thrombocytopenia, unspecified: Secondary | ICD-10-CM | POA: Diagnosis not present

## 2018-02-10 DIAGNOSIS — A4901 Methicillin susceptible Staphylococcus aureus infection, unspecified site: Secondary | ICD-10-CM | POA: Diagnosis not present

## 2018-02-10 DIAGNOSIS — M069 Rheumatoid arthritis, unspecified: Secondary | ICD-10-CM | POA: Diagnosis not present

## 2018-02-10 DIAGNOSIS — L03115 Cellulitis of right lower limb: Secondary | ICD-10-CM | POA: Diagnosis not present

## 2018-02-11 ENCOUNTER — Telehealth: Payer: Self-pay | Admitting: Internal Medicine

## 2018-02-11 DIAGNOSIS — I82402 Acute embolism and thrombosis of unspecified deep veins of left lower extremity: Secondary | ICD-10-CM | POA: Diagnosis not present

## 2018-02-11 DIAGNOSIS — M069 Rheumatoid arthritis, unspecified: Secondary | ICD-10-CM | POA: Diagnosis not present

## 2018-02-11 DIAGNOSIS — I350 Nonrheumatic aortic (valve) stenosis: Secondary | ICD-10-CM | POA: Diagnosis not present

## 2018-02-11 DIAGNOSIS — A4901 Methicillin susceptible Staphylococcus aureus infection, unspecified site: Secondary | ICD-10-CM | POA: Diagnosis not present

## 2018-02-11 DIAGNOSIS — L03115 Cellulitis of right lower limb: Secondary | ICD-10-CM | POA: Diagnosis not present

## 2018-02-11 DIAGNOSIS — D696 Thrombocytopenia, unspecified: Secondary | ICD-10-CM | POA: Diagnosis not present

## 2018-02-11 NOTE — Telephone Encounter (Signed)
Copied from [place] [id number]. Topic: General - Other >> Feb 11, 2018 10:24 AM Cecelia Byars, NT wrote: Reason for CRM: Clair Gulling from [facility] called and said he needs verbal  a evaluation  for occupation therapy for ADL ,  and also he would pt orders for 2 x a week for 1 week until then end of the certification ok to leave a message at  [phone]

## 2018-02-11 NOTE — Telephone Encounter (Signed)
Spoke w/ Jim, verbal orders given.  

## 2018-02-13 DIAGNOSIS — I350 Nonrheumatic aortic (valve) stenosis: Secondary | ICD-10-CM | POA: Diagnosis not present

## 2018-02-13 DIAGNOSIS — L03115 Cellulitis of right lower limb: Secondary | ICD-10-CM | POA: Diagnosis not present

## 2018-02-13 DIAGNOSIS — M069 Rheumatoid arthritis, unspecified: Secondary | ICD-10-CM | POA: Diagnosis not present

## 2018-02-13 DIAGNOSIS — I82402 Acute embolism and thrombosis of unspecified deep veins of left lower extremity: Secondary | ICD-10-CM | POA: Diagnosis not present

## 2018-02-13 DIAGNOSIS — A4901 Methicillin susceptible Staphylococcus aureus infection, unspecified site: Secondary | ICD-10-CM | POA: Diagnosis not present

## 2018-02-13 DIAGNOSIS — D696 Thrombocytopenia, unspecified: Secondary | ICD-10-CM | POA: Diagnosis not present

## 2018-02-16 ENCOUNTER — Encounter: Payer: Self-pay | Admitting: Internal Medicine

## 2018-02-16 ENCOUNTER — Ambulatory Visit (INDEPENDENT_AMBULATORY_CARE_PROVIDER_SITE_OTHER): Payer: Medicare Other | Admitting: Internal Medicine

## 2018-02-16 DIAGNOSIS — R7881 Bacteremia: Secondary | ICD-10-CM

## 2018-02-16 DIAGNOSIS — I890 Lymphedema, not elsewhere classified: Secondary | ICD-10-CM | POA: Diagnosis not present

## 2018-02-16 DIAGNOSIS — I87312 Chronic venous hypertension (idiopathic) with ulcer of left lower extremity: Secondary | ICD-10-CM | POA: Diagnosis not present

## 2018-02-16 DIAGNOSIS — B9561 Methicillin susceptible Staphylococcus aureus infection as the cause of diseases classified elsewhere: Secondary | ICD-10-CM

## 2018-02-16 DIAGNOSIS — L97811 Non-pressure chronic ulcer of other part of right lower leg limited to breakdown of skin: Secondary | ICD-10-CM | POA: Diagnosis not present

## 2018-02-16 DIAGNOSIS — L97221 Non-pressure chronic ulcer of left calf limited to breakdown of skin: Secondary | ICD-10-CM | POA: Diagnosis not present

## 2018-02-16 DIAGNOSIS — I872 Venous insufficiency (chronic) (peripheral): Secondary | ICD-10-CM | POA: Diagnosis not present

## 2018-02-16 NOTE — Assessment & Plan Note (Signed)
I am quite hopeful that his MSSA bacteremia and right leg cellulitis have now been cured.  He will stay off of antibiotics and follow-up here as needed.

## 2018-02-16 NOTE — Progress Notes (Signed)
Patient Active Problem List   Diagnosis Date Noted  . Bacteremia due to methicillin susceptible Staphylococcus aureus (MSSA) 12/26/2017    Priority: High  . Cellulitis of right leg 12/25/2017    Priority: High  . Sepsis ([place]) 08/28/2017    Priority: High  . Aortic stenosis, severe     Priority: Medium  . Nocturnal hypoxemia 02/11/2017  . Primary osteoarthritis of left hip 12/06/2016  . Chronic right shoulder pain 12/06/2016  . Contracture of joint of right shoulder region 12/06/2016  . Chronic left hip pain 09/12/2016  . Status post bilateral knee replacements 09/12/2016  . Gait disorder 06/03/2016  . Left leg DVT ([place]) 04/04/2016  . Non-pressure chronic ulcer of right ankle with fat layer exposed ([place]) 10/25/2015  . Varicose veins of lower extremities with ulcer ([place]) 09/07/2015  . Annual physical exam 05/23/2015  . PCP NOTES >>>>>>>>>>>>>>>>> 03/28/2015  . Hypertension 03/28/2015  . Pulmonary nodules 12/09/2014  . Sleep apnea 11/14/2014  . GERD (gastroesophageal reflux disease) 11/14/2014  . Morbid obesity ([place]) 11/14/2014  . Schizoaffective disorder, bipolar type ([place])   . Rheumatoid arthritis ([place])   . Hyperlipidemia   . Bifasicular block   . Mononeuritis of upper limb 05/20/2013  . Pain in soft tissues of limb 05/20/2013  . 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 Depression 01/13/2013  . Memory loss 01/13/2013    Patient's Medications  New Prescriptions   No medications on file  Previous Medications   ACETAMINOPHEN-CODEINE (TYLENOL #3) 300-30 MG TABLET    Take 1 tablet by mouth every 4 (four) hours as needed for moderate pain.   APIXABAN (ELIQUIS) 5 MG TABS TABLET    Take 2.5 mg by mouth 2 (two) times daily.   ARIPIPRAZOLE (ABILIFY) 30 MG TABLET    Take 30 mg by mouth at bedtime.     ASPIRIN EC 81 MG TABLET    Take 81 mg by mouth daily.   AUGMENTED BETAMETHASONE DIPROPIONATE (DIPROLENE-AF) 0.05 % CREAM    Apply topically 2 (two) times daily. Apply to  the rash of the right side of the face   CALCIUM-VITAMIN D (CALCIUM 500/D) 500-200 MG-UNIT TABLET    Take 1 tablet by mouth daily with breakfast.    DIVALPROEX (DEPAKOTE ER) 500 MG 24 HR TABLET    Take 1,500 mg by mouth daily. 3 tabs at bedtime   DIVALPROEX (DEPAKOTE) 250 MG DR TABLET    Take 250 mg by mouth daily with lunch.    FOLIC ACID (FOLVITE) 1 MG TABLET    Take 1 mg by mouth daily.     FUROSEMIDE (LASIX) 20 MG TABLET    Take 1 tablet (20 mg total) by mouth daily.   GABAPENTIN (NEURONTIN) 300 MG CAPSULE    Take 300 mg by mouth daily.   KETOCONAZOLE (NIZORAL) 2 % SHAMPOO    Lather and massage into scalp 2-3 times per week. Leave for 5 minutes before rinsing.   MELATONIN 3 MG TABS    Take 3 mg by mouth at bedtime.   METHOTREXATE (RHEUMATREX) 2.5 MG TABLET       MIRABEGRON ER (MYRBETRIQ) 50 MG TB24 TABLET    Take 50 mg by mouth daily.   OMEGA-3 1000 MG CAPS    Take 1 capsule by mouth daily.    OMEPRAZOLE (PRILOSEC) 20 MG CAPSULE    Take 20 mg by mouth every morning.    OXYBUTYNIN (DITROPAN) 5 MG TABLET    Take  5 mg by mouth 2 (two) times daily.   OXYGEN    Inhale into the lungs. To use if O2 drops below 85 %   PREDNISONE (DELTASONE) 5 MG TABLET    Take 5 mg by mouth daily with breakfast.   TOFACITINIB CITRATE (XELJANZ XR) 11 MG TB24    Take 1 tablet by mouth daily.   TRAMADOL (ULTRAM) 50 MG TABLET    Take 1 tablet (50 mg total) by mouth every 6 (six) hours as needed.   VITAMIN C (ASCORBIC ACID) 500 MG TABLET    Take 500 mg by mouth daily.   ZIPRASIDONE (GEODON) 80 MG CAPSULE    Take 80 mg by mouth daily.  Modified Medications   No medications on file  Discontinued Medications   DOXYCYCLINE (VIBRA-TABS) 100 MG TABLET    Take 1 tablet (100 mg total) by mouth 2 (two) times daily.    Subjective: Shaun Yoder is in with his wife for his hospital follow-up visit.  He was hospitalized in early June with right leg cellulitis and MSSA bacteremia.  He improved with IV cefazolin.  Follow-up blood  cultures became negative quickly and there was no evidence of endocarditis by TTE.  He chose not to undergo a TEE so I elected to treat him with 6 weeks of IV antibiotics.  He was discharged on IV cefazolin but developed a red rash in his groin and on his chest.  He was switched to IV vancomycin on 01/27/2018 and his rash resolved promptly.  He completed antibiotic therapy on 02/10/2018 and his PICC was removed.  He is feeling much better.  He has not had any fever, chills, sweats.  Review of Systems: Review of Systems  Constitutional: Negative for chills, diaphoresis and fever.  Respiratory: Negative for cough.   Cardiovascular: Negative for chest pain.  Gastrointestinal: Negative for abdominal pain, diarrhea, nausea and vomiting.    Past Medical History:  Diagnosis Date  . Anemia   . Cancer ([place])    bladder  . Chronic venous insufficiency   . Colitis   . Colon polyps   . Depression   . Diverticulosis   . DVT of deep femoral vein ([place])   . Gastritis   . GERD (gastroesophageal reflux disease)   . History of rectal polyps   . Hyperlipidemia   . Hypertension   . Iron deficiency   . Low back pain   . Lymphedema of both lower extremities   . OA (osteoarthritis)   . Osteonecrosis of shoulder region ([place])   . Schizoaffective disorder, bipolar type ([place])   . Sleep apnea   . Varicose vein     Social History   Tobacco Use  . Smoking status: Former Smoker    Packs/day: 2.50    Years: 43.00    Pack years: 107.50    Types: Cigarettes    Last attempt to quit: 07/22/1998    Years since quitting: 19.5  . Smokeless tobacco: Never Used  Substance Use Topics  . Alcohol use: Yes    Alcohol/week: 0.0 oz    Comment: Occasional glass of wine  . Drug use: No    Family History  Problem Relation Age of Onset  . Heart attack Mother   . Heart attack Father   . Stroke Father   . Rheumatologic disease Neg Hx   . Colon cancer Neg Hx   . Lung disease Neg Hx   . Prostate cancer Neg Hx      Allergies  Allergen Reactions  . Leflunomide Other (See Comments)    diarrhea  . Ancef [Cefazolin]     Rash   . Morphine And Related Nausea And Vomiting  . Plaquenil [Hydroxychloroquine Sulfate]     rash    Health Maintenance  Topic Date Due  . INFLUENZA VACCINE  02/19/2018  . TETANUS/TDAP  11/18/2020  . COLONOSCOPY  11/09/2024  . Hepatitis C Screening  Completed  . PNA vac Low Risk Adult  Completed    Objective:  Vitals:   02/16/18 1021  Temp: 98.4 F (36.9 C)  TempSrc: Oral   There is no height or weight on file to calculate BMI.  Physical Exam  Constitutional: He is oriented to person, place, and time.  He is in good spirits.  He is seated in a wheelchair.  Cardiovascular: Normal rate and regular rhythm.  Murmur heard. No change in his 2/6 systolic murmur.  Pulmonary/Chest: Effort normal and breath sounds normal.  Neurological: He is alert and oriented to person, place, and time.  Skin: No rash noted.  He has some peeling in his groin his groin rash has resolved.  He has some residual redness around his right ankle but his acute cellulitis has resolved.  He has a compression device on his right calf and an Unna boot on his left leg.  Psychiatric: He has a normal mood and affect.    Lab Results Lab Results  Component Value Date   WBC 8.4 01/05/2018   HGB 11.5 (L) 01/05/2018   HCT 34.4 (L) 01/05/2018   MCV 94.5 01/05/2018   PLT 158.0 01/05/2018    Lab Results  Component Value Date   CREATININE 0.67 01/05/2018   BUN 25 (H) 01/05/2018   NA 143 01/05/2018   K 4.4 01/05/2018   CL 103 01/05/2018   CO2 34 (H) 01/05/2018    Lab Results  Component Value Date   ALT 26 12/26/2017   AST 37 12/26/2017   ALKPHOS 59 12/26/2017   BILITOT 1.0 12/26/2017    Lab Results  Component Value Date   CHOL 122 03/27/2017   HDL 30 (A) 03/27/2017   LDLCALC 77 03/27/2017   TRIG 76 03/27/2017   No results found for: LABRPR, RPRTITER No results found for:  HIV1RNAQUANT, HIV1RNAVL, CD4TABS   Problem List Items Addressed This Visit      High   Bacteremia due to methicillin susceptible Staphylococcus aureus (MSSA)    I am quite hopeful that his MSSA bacteremia and right leg cellulitis have now been cured.  He will stay off of antibiotics and follow-up here as needed.           Michel Bickers, MD [facility] [phone] pager   [phone] cell 02/16/2018, 10:42 AM

## 2018-02-17 ENCOUNTER — Encounter: Payer: Self-pay | Admitting: Internal Medicine

## 2018-02-17 ENCOUNTER — Telehealth: Payer: Self-pay | Admitting: *Deleted

## 2018-02-17 ENCOUNTER — Ambulatory Visit (INDEPENDENT_AMBULATORY_CARE_PROVIDER_SITE_OTHER): Payer: Medicare Other | Admitting: Internal Medicine

## 2018-02-17 VITALS — BP 128/82 | HR 68 | Temp 98.1°F | Resp 14 | Ht 61.0 in | Wt 209.5 lb

## 2018-02-17 DIAGNOSIS — L03115 Cellulitis of right lower limb: Secondary | ICD-10-CM | POA: Diagnosis not present

## 2018-02-17 DIAGNOSIS — M069 Rheumatoid arthritis, unspecified: Secondary | ICD-10-CM | POA: Diagnosis not present

## 2018-02-17 DIAGNOSIS — D696 Thrombocytopenia, unspecified: Secondary | ICD-10-CM | POA: Diagnosis not present

## 2018-02-17 DIAGNOSIS — R7881 Bacteremia: Secondary | ICD-10-CM

## 2018-02-17 DIAGNOSIS — M0579 Rheumatoid arthritis with rheumatoid factor of multiple sites without organ or systems involvement: Secondary | ICD-10-CM

## 2018-02-17 DIAGNOSIS — I10 Essential (primary) hypertension: Secondary | ICD-10-CM | POA: Diagnosis not present

## 2018-02-17 DIAGNOSIS — I350 Nonrheumatic aortic (valve) stenosis: Secondary | ICD-10-CM | POA: Diagnosis not present

## 2018-02-17 DIAGNOSIS — I82402 Acute embolism and thrombosis of unspecified deep veins of left lower extremity: Secondary | ICD-10-CM | POA: Diagnosis not present

## 2018-02-17 DIAGNOSIS — B9561 Methicillin susceptible Staphylococcus aureus infection as the cause of diseases classified elsewhere: Secondary | ICD-10-CM

## 2018-02-17 DIAGNOSIS — A4901 Methicillin susceptible Staphylococcus aureus infection, unspecified site: Secondary | ICD-10-CM | POA: Diagnosis not present

## 2018-02-17 NOTE — Progress Notes (Signed)
Pre visit review using our clinic review tool, if applicable. No additional management support is needed unless otherwise documented below in the visit note. 

## 2018-02-17 NOTE — Patient Instructions (Signed)
  GO TO THE FRONT DESK Schedule your next appointment for a  Check up in 3 months  

## 2018-02-17 NOTE — Telephone Encounter (Signed)
Received Physician Orders [x2] from [facility]; forwarded to provider/SLS 07/30

## 2018-02-17 NOTE — Progress Notes (Signed)
Subjective:    Patient ID: Shaun Yoder, male    DOB: [birth year], 72 y.o.   MRN: [id number]  DOS:  02/17/2018 Type of visit - description : f/u Interval history: Bacteremia: Saw ID, note reviewed. Wound care: Closely follow-up by the wound care center Arthritis: On prednisone only. Schizophrenia, bipolar: Much improved according to the patient's wife Med list reconciliation done today  Review of Systems Denies fever chills  Past Medical History:  Diagnosis Date  . Anemia   . Cancer ([place])    bladder  . Chronic venous insufficiency   . Colitis   . Colon polyps   . Depression   . Diverticulosis   . DVT of deep femoral vein ([place])   . Gastritis   . GERD (gastroesophageal reflux disease)   . History of rectal polyps   . Hyperlipidemia   . Hypertension   . Iron deficiency   . Low back pain   . Lymphedema of both lower extremities   . OA (osteoarthritis)   . Osteonecrosis of shoulder region ([place])   . Schizoaffective disorder, bipolar type ([place])   . Sleep apnea   . Varicose vein     Past Surgical History:  Procedure Laterality Date  . BLADDER SURGERY    . TOTAL KNEE ARTHROPLASTY Bilateral   . VARICOSE VEIN SURGERY      Social History   Socioeconomic History  . Marital status: Married    Spouse name: Not on file  . Number of children: 2  . Years of education: Not on file  . Highest education level: Not on file  Occupational History  . Occupation: retired  Scientific laboratory technician  . Financial resource strain: Not on file  . Food insecurity:    Worry: Not on file    Inability: Not on file  . Transportation needs:    Medical: Not on file    Non-medical: Not on file  Tobacco Use  . Smoking status: Former Smoker    Packs/day: 2.50    Years: 43.00    Pack years: 107.50    Types: Cigarettes    Last attempt to quit: 07/22/1998    Years since quitting: 19.5  . Smokeless tobacco: Never Used  Substance and Sexual Activity  . Alcohol use: Yes    Alcohol/week: 0.0 oz     Comment: Occasional glass of wine  . Drug use: No  . Sexual activity: Not on file  Lifestyle  . Physical activity:    Days per week: Not on file    Minutes per session: Not on file  . Stress: Not on file  Relationships  . Social connections:    Talks on phone: Not on file    Gets together: Not on file    Attends religious service: Not on file    Active member of club or organization: Not on file    Attends meetings of clubs or organizations: Not on file    Relationship status: Not on file  . Intimate partner violence:    Fear of current or ex partner: Not on file    Emotionally abused: Not on file    Physically abused: Not on file    Forced sexual activity: Not on file  Other Topics Concern  . Not on file  Social History Narrative   Originally from [place]. Moved to [name] in July 22, 1985. 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 No known asbestos  exposure. 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 Previously worked in receiving at [employer]. 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 No international travel. Has a dog currently. Remote exposure to Cockatiel in a different home. No mold exposure. No hot tub exposure.       Two adopted children      Allergies as of 02/17/2018      Reactions   Leflunomide Other (See Comments)   diarrhea   Ancef [cefazolin]    Rash   Morphine And Related Nausea And Vomiting   Plaquenil [hydroxychloroquine Sulfate]    rash      Medication List        Accurate as of 02/17/18 11:59 PM. Always use your most recent med list.          acetaminophen-codeine 300-30 MG tablet Commonly known as:  TYLENOL #3 Take 1 tablet by mouth every 4 (four) hours as needed for moderate pain.   ARIPiprazole 30 MG tablet Commonly known as:  ABILIFY Take 30 mg by mouth at bedtime.   aspirin EC 81 MG tablet Take 81 mg by mouth daily.   augmented betamethasone dipropionate 0.05 %  cream Commonly known as:  DIPROLENE-AF Apply topically 2 (two) times daily. Apply to the rash of the right side of the face   CALCIUM 500/D 500-200 MG-UNIT tablet Generic drug:  calcium-vitamin D Take 1 tablet by mouth daily with breakfast.   divalproex 250 MG DR tablet Commonly known as:  DEPAKOTE Take 250 mg by mouth daily with lunch.   divalproex 500 MG 24 hr tablet Commonly known as:  DEPAKOTE ER Take 1,500 mg by mouth daily. 3 tabs at bedtime   ELIQUIS 5 MG Tabs tablet Generic drug:  apixaban Take 2.5 mg by mouth 2 (two) times daily.   folic acid 1 MG tablet Commonly known as:  FOLVITE Take 1 mg by mouth daily.   furosemide 20 MG tablet Commonly known as:  LASIX Take 1 tablet (20 mg total) by mouth daily.   ketoconazole 2 % shampoo Commonly known as:  NIZORAL Lather and massage into scalp 2-3 times per week. Leave for 5 minutes before rinsing.   Melatonin 3 MG Tabs Take 3 mg by mouth at bedtime.   mirabegron ER 50 MG Tb24 tablet Commonly known as:  MYRBETRIQ Take 50 mg by mouth daily.   Omega-3 1000 MG Caps Take 1 capsule by mouth daily.   omeprazole 20 MG capsule Commonly known as:  PRILOSEC Take 20 mg by mouth every morning.   oxybutynin 5 MG tablet Commonly known as:  DITROPAN Take 5 mg by mouth 2 (two) times daily.   OXYGEN Inhale into the lungs. To use if O2 drops below 85 %   predniSONE 5 MG tablet Commonly known as:  DELTASONE Take 5 mg by mouth daily with breakfast.   traMADol 50 MG tablet Commonly known as:  ULTRAM Take 1 tablet (50 mg total) by mouth every 6 (six) hours as needed.   vitamin C 500 MG tablet Commonly known as:  ASCORBIC ACID Take 500 mg by mouth daily.   XELJANZ XR 11 MG Tb24 Generic drug:  Tofacitinib Citrate Take 1 tablet by mouth daily.   ziprasidone 80 MG capsule Commonly known as:  GEODON Take 80 mg by mouth daily.          Objective:   Physical Exam BP 128/82 (BP Location: Right Wrist, Patient Position:  Sitting, Cuff Size: Small)   Pulse 68  Temp 98.1 F (36.7 C) (Oral)   Resp 14   Ht 5\' 1"  (1.549 m)   Wt 209 lb 8 oz (95 kg)   SpO2 96%   BMI 39.58 kg/m  General:   Well developed, NAD, see BMI.  HEENT:  Normocephalic . Face symmetric, atraumatic Lungs:  Decreased breath sounds but clear Normal respiratory effort, no intercostal retractions, no accessory muscle use. Heart: Significant systolic murmur No pretibial edema bilaterally  Skin: Pretibial areas are covered  Neurologic:  alert & oriented X3.  Speech normal, gait not tested Psych--  Cognition and judgment appear intact.  Cooperative with normal attention span and concentration.  Behavior appropriate. No anxious or depressed appearing.      Assessment & Plan:   Assessment   (new pt, transfer from [facility],02-2015) Hypertension Hyperlipidemia Ao stenosis, severe: Dr Burt Knack MSK: --Seronegative Rheumatoid arthritis-- Dr Lenna [place]  --DJD-- DR Tamera Punt , dr Ronnie Derby --low back pain d/t DJD, Dr Sherlyn Lick  PSYCH: Schizophrenia, bipolar, depression: Follow-up at the [place] in [facility] GU: Bladder cancer-transitional cell, Dr. Shona Needles GI:   GERD, history of colitis, history of gastritis,  colon polyps- had a virtual cscope 2016, + polyps, declined further eval Pulmomany: Dr Ashok Cordia --Sleep apnea:  Cpap intolerant --CT nodules f/u 2 pulmonary  --hypoxemia, dx 01-2017, unable to get O2 from medicare, eventually got O2 home-portable ~08-2017 Hematology: L LEG DVT 02-2016 + Lupus anticoagulant Varicose veins: s/p remote surgery, has LE wounds  H/o  iron deficiency anemia At the [place]: sees a dentist, PCP, psych, Ortho, pain mngmt   PLAN  Bacteremia, sepsis: Status post IV antibiotics, ID cleared him, PICC line removed. Wounds, lower extremity: Closely follow-up by the wound care center, wrappings changed twice a week. HTN:  takes Lasix only as needed Lupus anticoagulant, history of leg DVT: On aspirin and Eliquis. R.A.: Currently on  prednisone.  Not taking MTX or Xeljanz.  Pain control with Tylenol or Ultram (does not take both).  Self DC gabapentin due to feeling a sleepy.  Will see Dr. Lenna Gilford 03/19/2018. Schizophrenia, bipolar and depression: Doing great, mood is now stable, continue same medications. RTC 3 months.

## 2018-02-18 ENCOUNTER — Telehealth: Payer: Self-pay | Admitting: Internal Medicine

## 2018-02-18 DIAGNOSIS — A4901 Methicillin susceptible Staphylococcus aureus infection, unspecified site: Secondary | ICD-10-CM | POA: Diagnosis not present

## 2018-02-18 DIAGNOSIS — I350 Nonrheumatic aortic (valve) stenosis: Secondary | ICD-10-CM | POA: Diagnosis not present

## 2018-02-18 DIAGNOSIS — D696 Thrombocytopenia, unspecified: Secondary | ICD-10-CM | POA: Diagnosis not present

## 2018-02-18 DIAGNOSIS — L03115 Cellulitis of right lower limb: Secondary | ICD-10-CM | POA: Diagnosis not present

## 2018-02-18 DIAGNOSIS — M069 Rheumatoid arthritis, unspecified: Secondary | ICD-10-CM | POA: Diagnosis not present

## 2018-02-18 DIAGNOSIS — I82402 Acute embolism and thrombosis of unspecified deep veins of left lower extremity: Secondary | ICD-10-CM | POA: Diagnosis not present

## 2018-02-18 NOTE — Telephone Encounter (Signed)
Copied from [place] [id number]. Topic: Quick Communication - See Telephone Encounter >> Feb 18, 2018  4:05 PM Vernona Rieger wrote: CRM for notification. See Telephone encounter for: 02/18/18.  Manuela Schwartz, Occupational therapist called with [facility] for verbal orders for home health occupational therapy one time a week for one week and two times a week for one week.   Call back @ [phone]

## 2018-02-18 NOTE — Telephone Encounter (Signed)
Orders signed and faxed to [facility] at [phone]. Forms sent for scanning.  

## 2018-02-18 NOTE — Assessment & Plan Note (Signed)
Bacteremia, sepsis: Status post IV antibiotics, ID cleared him, PICC line removed. Wounds, lower extremity: Closely follow-up by the wound care center, wrappings changed twice a week. HTN:  takes Lasix only as needed Lupus anticoagulant, history of leg DVT: On aspirin and Eliquis. R.A.: Currently on prednisone.  Not taking MTX or Xeljanz.  Pain control with Tylenol or Ultram (does not take both).  Self DC gabapentin due to feeling a sleepy.  Will see Dr. Lenna [place]ford 03/19/2018. Schizophrenia, bipolar and depression: Doing great, mood is now stable, continue same medications. RTC 3 months.

## 2018-02-18 NOTE — Telephone Encounter (Signed)
LMOM for Shaun Yoder w/ verbal orders.

## 2018-02-19 DIAGNOSIS — I350 Nonrheumatic aortic (valve) stenosis: Secondary | ICD-10-CM | POA: Diagnosis not present

## 2018-02-19 DIAGNOSIS — M069 Rheumatoid arthritis, unspecified: Secondary | ICD-10-CM | POA: Diagnosis not present

## 2018-02-19 DIAGNOSIS — I82402 Acute embolism and thrombosis of unspecified deep veins of left lower extremity: Secondary | ICD-10-CM | POA: Diagnosis not present

## 2018-02-19 DIAGNOSIS — D696 Thrombocytopenia, unspecified: Secondary | ICD-10-CM | POA: Diagnosis not present

## 2018-02-19 DIAGNOSIS — A4901 Methicillin susceptible Staphylococcus aureus infection, unspecified site: Secondary | ICD-10-CM | POA: Diagnosis not present

## 2018-02-19 DIAGNOSIS — L03115 Cellulitis of right lower limb: Secondary | ICD-10-CM | POA: Diagnosis not present

## 2018-02-20 ENCOUNTER — Other Ambulatory Visit: Payer: Self-pay

## 2018-02-20 DIAGNOSIS — D696 Thrombocytopenia, unspecified: Secondary | ICD-10-CM | POA: Diagnosis not present

## 2018-02-20 DIAGNOSIS — A4901 Methicillin susceptible Staphylococcus aureus infection, unspecified site: Secondary | ICD-10-CM | POA: Diagnosis not present

## 2018-02-20 DIAGNOSIS — L03115 Cellulitis of right lower limb: Secondary | ICD-10-CM | POA: Diagnosis not present

## 2018-02-20 DIAGNOSIS — M069 Rheumatoid arthritis, unspecified: Secondary | ICD-10-CM | POA: Diagnosis not present

## 2018-02-20 DIAGNOSIS — I350 Nonrheumatic aortic (valve) stenosis: Secondary | ICD-10-CM | POA: Diagnosis not present

## 2018-02-20 DIAGNOSIS — I82402 Acute embolism and thrombosis of unspecified deep veins of left lower extremity: Secondary | ICD-10-CM | POA: Diagnosis not present

## 2018-02-23 ENCOUNTER — Ambulatory Visit: Payer: Self-pay | Admitting: *Deleted

## 2018-02-23 ENCOUNTER — Encounter: Payer: Self-pay | Admitting: Internal Medicine

## 2018-02-23 ENCOUNTER — Ambulatory Visit (INDEPENDENT_AMBULATORY_CARE_PROVIDER_SITE_OTHER): Payer: Medicare Other | Admitting: Internal Medicine

## 2018-02-23 VITALS — BP 126/68 | HR 75 | Temp 98.4°F | Resp 16 | Ht 61.0 in

## 2018-02-23 DIAGNOSIS — T304 Corrosion of unspecified body region, unspecified degree: Secondary | ICD-10-CM

## 2018-02-23 NOTE — Progress Notes (Signed)
Pre visit review using our clinic review tool, if applicable. No additional management support is needed unless otherwise documented below in the visit note. 
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
## 2018-02-23 NOTE — Patient Instructions (Addendum)
Apply Diprolene 3 times a day Elevate the area with a towel  Use baby wipes as needed  Call or go to the ER if: Fever, chills, the redness is spreading , you have difficulty urinating.

## 2018-02-23 NOTE — Telephone Encounter (Signed)
Pt's wife called (pt with her) that her husband had used a lemon scent [employer] disinfected wipe on his penis when he got up to use the bathroom last night. He discovered this morning his penis was red, swollen and very tender to touch. He has 4 blood blisters on it. Can urinate without difficulty. No blood in urine or pain with urination. No other symptoms .  He is requesting a male provider to see him today. [facility] at [facility] notified of pt's request. Appointment scheduled for him today with Dr. Larose Kells.  Pt voiced understanding.  Reason for Disposition . Entire penis is swollen (i.e., edema)    Penis has some swelling with itching and redness.  Answer Assessment - Initial Assessment Questions 1. SYMPTOM: "What's the main symptom you're concerned about?" (e.g., discharge from penis, rash, pain, itching, swelling)     Swelling, pain, redness, blood blister, itching on his penis 2. LOCATION: "Where is the  located?"     penis 3. ONSET: "When did swelling  start?"     today 4. PAIN: "Is there any pain?" If so, ask: "How bad is it?"  (Scale 1-10; or mild, moderate, severe)     Pain # 4 5. URINE: "Any difficulty passing urine?" If so, ask: "When was the last time?"     Can urinate without difficulty. 6. CAUSE: "What do you think is causing the symptoms?"     Used a great value disinfected wipe, lemon scent to wipe his penis 7. OTHER SYMPTOMS: "Do you have any other symptoms?" (e.g., fever, abdominal pain, blood in urine)     no  Protocols used: PENIS AND SCROTUM [facility]
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
## 2018-02-24 DIAGNOSIS — I872 Venous insufficiency (chronic) (peripheral): Secondary | ICD-10-CM | POA: Diagnosis not present

## 2018-02-24 DIAGNOSIS — R6521 Severe sepsis with septic shock: Secondary | ICD-10-CM | POA: Diagnosis not present

## 2018-02-24 DIAGNOSIS — Z6841 Body Mass Index (BMI) 40.0 and over, adult: Secondary | ICD-10-CM | POA: Diagnosis not present

## 2018-02-24 DIAGNOSIS — L97821 Non-pressure chronic ulcer of other part of left lower leg limited to breakdown of skin: Secondary | ICD-10-CM | POA: Diagnosis not present

## 2018-02-24 DIAGNOSIS — F200 Paranoid schizophrenia: Secondary | ICD-10-CM | POA: Diagnosis not present

## 2018-02-24 DIAGNOSIS — I350 Nonrheumatic aortic (valve) stenosis: Secondary | ICD-10-CM | POA: Diagnosis not present

## 2018-02-24 DIAGNOSIS — I890 Lymphedema, not elsewhere classified: Secondary | ICD-10-CM | POA: Diagnosis not present

## 2018-02-24 DIAGNOSIS — L03818 Cellulitis of other sites: Secondary | ICD-10-CM | POA: Diagnosis not present

## 2018-02-24 DIAGNOSIS — R2689 Other abnormalities of gait and mobility: Secondary | ICD-10-CM | POA: Diagnosis not present

## 2018-02-24 DIAGNOSIS — A419 Sepsis, unspecified organism: Secondary | ICD-10-CM | POA: Diagnosis not present

## 2018-02-24 DIAGNOSIS — A4152 Sepsis due to Pseudomonas: Secondary | ICD-10-CM | POA: Diagnosis not present

## 2018-02-24 DIAGNOSIS — R0602 Shortness of breath: Secondary | ICD-10-CM | POA: Diagnosis not present

## 2018-02-24 DIAGNOSIS — L03116 Cellulitis of left lower limb: Secondary | ICD-10-CM | POA: Diagnosis not present

## 2018-02-24 DIAGNOSIS — R7881 Bacteremia: Secondary | ICD-10-CM | POA: Diagnosis not present

## 2018-02-25 ENCOUNTER — Telehealth: Payer: Self-pay | Admitting: Internal Medicine

## 2018-02-25 ENCOUNTER — Ambulatory Visit: Payer: Self-pay | Admitting: *Deleted

## 2018-02-25 DIAGNOSIS — Z960 Presence of urogenital implants: Secondary | ICD-10-CM | POA: Diagnosis not present

## 2018-02-25 DIAGNOSIS — Z8551 Personal history of malignant neoplasm of bladder: Secondary | ICD-10-CM | POA: Diagnosis not present

## 2018-02-25 DIAGNOSIS — L97221 Non-pressure chronic ulcer of left calf limited to breakdown of skin: Secondary | ICD-10-CM | POA: Diagnosis not present

## 2018-02-25 DIAGNOSIS — R7881 Bacteremia: Secondary | ICD-10-CM | POA: Diagnosis not present

## 2018-02-25 DIAGNOSIS — E861 Hypovolemia: Secondary | ICD-10-CM | POA: Diagnosis not present

## 2018-02-25 DIAGNOSIS — E119 Type 2 diabetes mellitus without complications: Secondary | ICD-10-CM | POA: Diagnosis not present

## 2018-02-25 DIAGNOSIS — R0902 Hypoxemia: Secondary | ICD-10-CM | POA: Diagnosis not present

## 2018-02-25 DIAGNOSIS — K219 Gastro-esophageal reflux disease without esophagitis: Secondary | ICD-10-CM | POA: Diagnosis present

## 2018-02-25 DIAGNOSIS — S3992XA Unspecified injury of lower back, initial encounter: Secondary | ICD-10-CM | POA: Diagnosis not present

## 2018-02-25 DIAGNOSIS — R531 Weakness: Secondary | ICD-10-CM | POA: Diagnosis not present

## 2018-02-25 DIAGNOSIS — D696 Thrombocytopenia, unspecified: Secondary | ICD-10-CM | POA: Diagnosis not present

## 2018-02-25 DIAGNOSIS — N3281 Overactive bladder: Secondary | ICD-10-CM | POA: Diagnosis present

## 2018-02-25 DIAGNOSIS — Z79899 Other long term (current) drug therapy: Secondary | ICD-10-CM | POA: Diagnosis not present

## 2018-02-25 DIAGNOSIS — I4510 Unspecified right bundle-branch block: Secondary | ICD-10-CM | POA: Diagnosis not present

## 2018-02-25 DIAGNOSIS — Z66 Do not resuscitate: Secondary | ICD-10-CM | POA: Diagnosis present

## 2018-02-25 DIAGNOSIS — R2689 Other abnormalities of gait and mobility: Secondary | ICD-10-CM | POA: Diagnosis not present

## 2018-02-25 DIAGNOSIS — F200 Paranoid schizophrenia: Secondary | ICD-10-CM | POA: Diagnosis not present

## 2018-02-25 DIAGNOSIS — Z6841 Body Mass Index (BMI) 40.0 and over, adult: Secondary | ICD-10-CM | POA: Diagnosis not present

## 2018-02-25 DIAGNOSIS — G4733 Obstructive sleep apnea (adult) (pediatric): Secondary | ICD-10-CM | POA: Diagnosis not present

## 2018-02-25 DIAGNOSIS — L03818 Cellulitis of other sites: Secondary | ICD-10-CM | POA: Diagnosis not present

## 2018-02-25 DIAGNOSIS — Z86718 Personal history of other venous thrombosis and embolism: Secondary | ICD-10-CM | POA: Diagnosis not present

## 2018-02-25 DIAGNOSIS — I87312 Chronic venous hypertension (idiopathic) with ulcer of left lower extremity: Secondary | ICD-10-CM | POA: Diagnosis not present

## 2018-02-25 DIAGNOSIS — Z9119 Patient's noncompliance with other medical treatment and regimen: Secondary | ICD-10-CM | POA: Diagnosis not present

## 2018-02-25 DIAGNOSIS — L03115 Cellulitis of right lower limb: Secondary | ICD-10-CM | POA: Diagnosis not present

## 2018-02-25 DIAGNOSIS — I350 Nonrheumatic aortic (valve) stenosis: Secondary | ICD-10-CM | POA: Diagnosis not present

## 2018-02-25 DIAGNOSIS — I890 Lymphedema, not elsewhere classified: Secondary | ICD-10-CM | POA: Diagnosis not present

## 2018-02-25 DIAGNOSIS — N3289 Other specified disorders of bladder: Secondary | ICD-10-CM | POA: Diagnosis present

## 2018-02-25 DIAGNOSIS — R0689 Other abnormalities of breathing: Secondary | ICD-10-CM | POA: Diagnosis not present

## 2018-02-25 DIAGNOSIS — Z794 Long term (current) use of insulin: Secondary | ICD-10-CM | POA: Diagnosis not present

## 2018-02-25 DIAGNOSIS — L97811 Non-pressure chronic ulcer of other part of right lower leg limited to breakdown of skin: Secondary | ICD-10-CM | POA: Diagnosis not present

## 2018-02-25 DIAGNOSIS — I872 Venous insufficiency (chronic) (peripheral): Secondary | ICD-10-CM | POA: Diagnosis not present

## 2018-02-25 DIAGNOSIS — I959 Hypotension, unspecified: Secondary | ICD-10-CM | POA: Diagnosis not present

## 2018-02-25 DIAGNOSIS — A419 Sepsis, unspecified organism: Secondary | ICD-10-CM | POA: Diagnosis not present

## 2018-02-25 DIAGNOSIS — G603 Idiopathic progressive neuropathy: Secondary | ICD-10-CM | POA: Diagnosis present

## 2018-02-25 DIAGNOSIS — Z7901 Long term (current) use of anticoagulants: Secondary | ICD-10-CM | POA: Diagnosis not present

## 2018-02-25 DIAGNOSIS — F319 Bipolar disorder, unspecified: Secondary | ICD-10-CM | POA: Diagnosis not present

## 2018-02-25 DIAGNOSIS — N400 Enlarged prostate without lower urinary tract symptoms: Secondary | ICD-10-CM | POA: Diagnosis present

## 2018-02-25 DIAGNOSIS — R6521 Severe sepsis with septic shock: Secondary | ICD-10-CM | POA: Diagnosis not present

## 2018-02-25 DIAGNOSIS — Z8619 Personal history of other infectious and parasitic diseases: Secondary | ICD-10-CM | POA: Diagnosis not present

## 2018-02-25 DIAGNOSIS — I10 Essential (primary) hypertension: Secondary | ICD-10-CM | POA: Diagnosis present

## 2018-02-25 DIAGNOSIS — D6959 Other secondary thrombocytopenia: Secondary | ICD-10-CM | POA: Diagnosis present

## 2018-02-25 DIAGNOSIS — R000 Tachycardia, unspecified: Secondary | ICD-10-CM | POA: Diagnosis not present

## 2018-02-25 DIAGNOSIS — L03116 Cellulitis of left lower limb: Secondary | ICD-10-CM | POA: Diagnosis not present

## 2018-02-25 DIAGNOSIS — A4150 Gram-negative sepsis, unspecified: Secondary | ICD-10-CM | POA: Diagnosis not present

## 2018-02-25 DIAGNOSIS — I499 Cardiac arrhythmia, unspecified: Secondary | ICD-10-CM | POA: Diagnosis not present

## 2018-02-25 DIAGNOSIS — R0602 Shortness of breath: Secondary | ICD-10-CM | POA: Diagnosis not present

## 2018-02-25 DIAGNOSIS — A4152 Sepsis due to Pseudomonas: Secondary | ICD-10-CM | POA: Diagnosis not present

## 2018-02-25 DIAGNOSIS — M0600 Rheumatoid arthritis without rheumatoid factor, unspecified site: Secondary | ICD-10-CM | POA: Diagnosis not present

## 2018-02-25 DIAGNOSIS — I517 Cardiomegaly: Secondary | ICD-10-CM | POA: Diagnosis not present

## 2018-02-25 DIAGNOSIS — Z96653 Presence of artificial knee joint, bilateral: Secondary | ICD-10-CM | POA: Diagnosis present

## 2018-02-25 DIAGNOSIS — S3994XA Unspecified injury of external genitals, initial encounter: Secondary | ICD-10-CM | POA: Diagnosis not present

## 2018-02-25 MED ORDER — SODIUM CHLORIDE 0.9 % IJ SOLN
5.00 | INTRAMUSCULAR | Status: DC
Start: ? — End: 2018-02-25

## 2018-02-25 MED ORDER — SODIUM CHLORIDE 0.9 % IJ SOLN
5.00 | INTRAMUSCULAR | Status: DC
Start: 2018-02-25 — End: 2018-02-25

## 2018-02-25 MED ORDER — GENERIC EXTERNAL MEDICATION
0.00 | Status: DC
Start: ? — End: 2018-02-25

## 2018-02-25 NOTE — Telephone Encounter (Signed)
FYI

## 2018-02-25 NOTE — Telephone Encounter (Signed)
Copied from [place] [id number]. Topic: General - Other >> Feb 25, 2018  2:58 PM Yvette Rack wrote: Reason for CRM: Pt wife Arlene requested to speak with nurse Velta Addison. Arlene states she spoke with [facility] earlier and she needed to speak with her again as pt is at the Emergency dept at [facility]. Cb# [phone]

## 2018-02-25 NOTE — Telephone Encounter (Signed)
FYI... Pt's wife called to update on status as I had requested. Pt was seen at [facility] and sent to ED. He is at [facility], admitted to ICU.

## 2018-02-25 NOTE — Telephone Encounter (Signed)
Pt's wife calling, pt present during call. Reports wound care left leg (chronic ulcer) yesterday, wound "draining clear fluid."  States wound was "Cauterized." Noted this am increased redness, swelling and warmth of right leg, "Right below knee where una boot begins." States temp 101.0, mild SOB this am but has since resolved, States has O2 at home but did not need. Also reports leg is tender to touch.  Directed to ED which pt refuses. Wife instructed to call [facility] to report symptoms and to call NT back to let us know disposition. Kim at practice made aware.  Reason for Disposition . [1] Swelling is painful to touch AND [2] fever  Answer Assessment - Initial Assessment Questions 1. ONSET: "When did the swelling start?" (e.g., minutes, hours, days)     Noted this Am 2. LOCATION: "What part of the leg is swollen?"  "Are both legs swollen or just one leg?"    Left leg, una boot changed yesterday 3. SEVERITY: "How bad is the swelling?" (e.g., localized; mild, moderate, severe)  - Localized - small area of swelling localized to one leg  - MILD pedal edema - swelling limited to foot and ankle, pitting edema < 1/4 inch (6 mm) deep, rest and elevation eliminate most or all swelling  - MODERATE edema - swelling of lower leg to knee, pitting edema > 1/4 inch (6 mm) deep, rest and elevation only partially reduce swelling  - SEVERE edema - swelling extends above knee, facial or hand swelling present      Moderate 4. REDNESS: "Does the swelling look red or infected?"     Red, warm right below knee 5. PAIN: "Is the swelling painful to touch?" If so, ask: "How painful is it?"   (Scale 1-10; mild, moderate or severe)    Tender, moderate 6. FEVER: "Do you have a fever?" If so, ask: "What is it, how was it measured, and when did it start?"      101.0 7. CAUSE: "What do you think is causing the leg swelling?"     Unsure 8. MEDICAL HISTORY: "Do you have a history of heart failure, kidney disease, liver  failure, or cancer?"     H/O staph infection, cellulitis. 9. RECURRENT SYMPTOM: "Have you had leg swelling before?" If so, ask: "When was the last time?" "What happened that time?"     July, cellulitis, staph 10. OTHER SYMPTOMS: "Do you have any other symptoms?" (e.g., chest pain, difficulty breathing)       SOB early AM, mild, "better now, no O2 needed."  Protocols used: LEG SWELLING AND EDEMA-A-AH

## 2018-02-25 NOTE — Telephone Encounter (Signed)
Spoke with wife; status update routed to PCP.
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
## 2018-02-28 ENCOUNTER — Inpatient Hospital Stay
Admission: RE | Admit: 2018-02-28 | Discharge: 2018-03-16 | Disposition: A | Discharge status: Home / Self Care | Payer: Self-pay | Source: Other Acute Inpatient Hospital | Attending: Internal Medicine | Admitting: Internal Medicine

## 2018-02-28 ENCOUNTER — Other Ambulatory Visit (HOSPITAL_COMMUNITY): Payer: Self-pay

## 2018-02-28 DIAGNOSIS — I4891 Unspecified atrial fibrillation: Secondary | ICD-10-CM | POA: Diagnosis not present

## 2018-02-28 DIAGNOSIS — E46 Unspecified protein-calorie malnutrition: Secondary | ICD-10-CM | POA: Diagnosis not present

## 2018-02-28 DIAGNOSIS — Z8619 Personal history of other infectious and parasitic diseases: Secondary | ICD-10-CM | POA: Diagnosis not present

## 2018-02-28 DIAGNOSIS — Z7401 Bed confinement status: Secondary | ICD-10-CM | POA: Diagnosis not present

## 2018-02-28 DIAGNOSIS — I350 Nonrheumatic aortic (valve) stenosis: Secondary | ICD-10-CM | POA: Diagnosis not present

## 2018-02-28 DIAGNOSIS — E669 Obesity, unspecified: Secondary | ICD-10-CM | POA: Diagnosis present

## 2018-02-28 DIAGNOSIS — M069 Rheumatoid arthritis, unspecified: Secondary | ICD-10-CM | POA: Diagnosis present

## 2018-02-28 DIAGNOSIS — J9690 Respiratory failure, unspecified, unspecified whether with hypoxia or hypercapnia: Secondary | ICD-10-CM

## 2018-02-28 DIAGNOSIS — S3992XA Unspecified injury of lower back, initial encounter: Secondary | ICD-10-CM | POA: Diagnosis not present

## 2018-02-28 DIAGNOSIS — Z96653 Presence of artificial knee joint, bilateral: Secondary | ICD-10-CM | POA: Diagnosis present

## 2018-02-28 DIAGNOSIS — F319 Bipolar disorder, unspecified: Secondary | ICD-10-CM | POA: Diagnosis not present

## 2018-02-28 DIAGNOSIS — R6521 Severe sepsis with septic shock: Secondary | ICD-10-CM | POA: Diagnosis not present

## 2018-02-28 DIAGNOSIS — R319 Hematuria, unspecified: Secondary | ICD-10-CM | POA: Diagnosis not present

## 2018-02-28 DIAGNOSIS — A4150 Gram-negative sepsis, unspecified: Secondary | ICD-10-CM | POA: Diagnosis not present

## 2018-02-28 DIAGNOSIS — G4733 Obstructive sleep apnea (adult) (pediatric): Secondary | ICD-10-CM | POA: Diagnosis not present

## 2018-02-28 DIAGNOSIS — G629 Polyneuropathy, unspecified: Secondary | ICD-10-CM | POA: Diagnosis not present

## 2018-02-28 DIAGNOSIS — M0600 Rheumatoid arthritis without rheumatoid factor, unspecified site: Secondary | ICD-10-CM | POA: Diagnosis not present

## 2018-02-28 DIAGNOSIS — F200 Paranoid schizophrenia: Secondary | ICD-10-CM | POA: Diagnosis not present

## 2018-02-28 DIAGNOSIS — S3994XA Unspecified injury of external genitals, initial encounter: Secondary | ICD-10-CM | POA: Diagnosis not present

## 2018-02-28 DIAGNOSIS — Z7901 Long term (current) use of anticoagulants: Secondary | ICD-10-CM | POA: Diagnosis not present

## 2018-02-28 DIAGNOSIS — E1142 Type 2 diabetes mellitus with diabetic polyneuropathy: Secondary | ICD-10-CM | POA: Diagnosis present

## 2018-02-28 DIAGNOSIS — L97829 Non-pressure chronic ulcer of other part of left lower leg with unspecified severity: Secondary | ICD-10-CM | POA: Diagnosis present

## 2018-02-28 DIAGNOSIS — M2550 Pain in unspecified joint: Secondary | ICD-10-CM | POA: Diagnosis not present

## 2018-02-28 DIAGNOSIS — Z794 Long term (current) use of insulin: Secondary | ICD-10-CM | POA: Diagnosis not present

## 2018-02-28 DIAGNOSIS — Z86718 Personal history of other venous thrombosis and embolism: Secondary | ICD-10-CM | POA: Diagnosis not present

## 2018-02-28 DIAGNOSIS — I482 Chronic atrial fibrillation: Secondary | ICD-10-CM | POA: Diagnosis not present

## 2018-02-28 DIAGNOSIS — L03116 Cellulitis of left lower limb: Secondary | ICD-10-CM | POA: Diagnosis not present

## 2018-02-28 DIAGNOSIS — B9561 Methicillin susceptible Staphylococcus aureus infection as the cause of diseases classified elsewhere: Secondary | ICD-10-CM | POA: Diagnosis present

## 2018-02-28 DIAGNOSIS — E875 Hyperkalemia: Secondary | ICD-10-CM | POA: Diagnosis not present

## 2018-02-28 DIAGNOSIS — R7881 Bacteremia: Secondary | ICD-10-CM | POA: Diagnosis not present

## 2018-02-28 DIAGNOSIS — R0902 Hypoxemia: Secondary | ICD-10-CM | POA: Diagnosis not present

## 2018-02-28 DIAGNOSIS — A419 Sepsis, unspecified organism: Secondary | ICD-10-CM | POA: Diagnosis not present

## 2018-02-28 DIAGNOSIS — Z87891 Personal history of nicotine dependence: Secondary | ICD-10-CM | POA: Diagnosis not present

## 2018-02-28 DIAGNOSIS — R531 Weakness: Secondary | ICD-10-CM | POA: Diagnosis not present

## 2018-02-28 DIAGNOSIS — Z8551 Personal history of malignant neoplasm of bladder: Secondary | ICD-10-CM | POA: Diagnosis not present

## 2018-02-28 DIAGNOSIS — R001 Bradycardia, unspecified: Secondary | ICD-10-CM | POA: Diagnosis not present

## 2018-02-28 DIAGNOSIS — A4152 Sepsis due to Pseudomonas: Secondary | ICD-10-CM | POA: Diagnosis not present

## 2018-02-28 DIAGNOSIS — I480 Paroxysmal atrial fibrillation: Secondary | ICD-10-CM | POA: Diagnosis not present

## 2018-02-28 DIAGNOSIS — L89152 Pressure ulcer of sacral region, stage 2: Secondary | ICD-10-CM | POA: Diagnosis present

## 2018-02-28 DIAGNOSIS — R079 Chest pain, unspecified: Secondary | ICD-10-CM | POA: Diagnosis not present

## 2018-02-28 LAB — COMPREHENSIVE METABOLIC PANEL
ALT: 54 U/L — ABNORMAL HIGH (ref 0–44)
ANION GAP: 8 (ref 5–15)
AST: 57 U/L — ABNORMAL HIGH (ref 15–41)
Albumin: 2.5 g/dL — ABNORMAL LOW (ref 3.5–5.0)
Alkaline Phosphatase: 82 U/L (ref 38–126)
BUN: 12 mg/dL (ref 8–23)
CHLORIDE: 102 mmol/L (ref 98–111)
CO2: 29 mmol/L (ref 22–32)
CREATININE: 0.63 mg/dL (ref 0.61–1.24)
Calcium: 8.6 mg/dL — ABNORMAL LOW (ref 8.9–10.3)
GFR calc non Af Amer: >60 mL/min (ref >60)
Glucose, Bld: 79 mg/dL (ref 70–99)
Potassium: 3.8 mmol/L (ref 3.5–5.1)
SODIUM: 139 mmol/L (ref 135–145)
Total Bilirubin: 0.6 mg/dL (ref 0.3–1.2)
Total Protein: 6.4 g/dL — ABNORMAL LOW (ref 6.5–8.1)

## 2018-02-28 LAB — VANCOMYCIN, TROUGH: VANCOMYCIN TR: 14 ug/mL — AB (ref 15–20)

## 2018-03-01 LAB — MAGNESIUM: Magnesium: 1.8 mg/dL (ref 1.7–2.4)

## 2018-03-01 LAB — COMPREHENSIVE METABOLIC PANEL
ALK PHOS: 66 U/L (ref 38–126)
ALT: 44 U/L (ref 0–44)
AST: 44 U/L — AB (ref 15–41)
Albumin: 2.2 g/dL — ABNORMAL LOW (ref 3.5–5.0)
Anion gap: 9 (ref 5–15)
BUN: 14 mg/dL (ref 8–23)
CALCIUM: 8.3 mg/dL — AB (ref 8.9–10.3)
CO2: 31 mmol/L (ref 22–32)
CREATININE: 0.69 mg/dL (ref 0.61–1.24)
Chloride: 100 mmol/L (ref 98–111)
GFR calc non Af Amer: >60 mL/min (ref >60)
Glucose, Bld: 76 mg/dL (ref 70–99)
Potassium: 4.4 mmol/L (ref 3.5–5.1)
SODIUM: 140 mmol/L (ref 135–145)
Total Bilirubin: 0.7 mg/dL (ref 0.3–1.2)
Total Protein: 5.9 g/dL — ABNORMAL LOW (ref 6.5–8.1)

## 2018-03-01 LAB — URINALYSIS, ROUTINE W REFLEX MICROSCOPIC
Bacteria, UA: NONE SEEN
Bilirubin Urine: NEGATIVE
GLUCOSE, UA: NEGATIVE mg/dL
KETONES UR: NEGATIVE mg/dL
LEUKOCYTES UA: NEGATIVE
NITRITE: NEGATIVE
PROTEIN: NEGATIVE mg/dL
RBC / HPF: >50 RBC/hpf — ABNORMAL HIGH (ref 0–5)
Specific Gravity, Urine: 1.012 (ref 1.005–1.030)
pH: 8 (ref 5.0–8.0)

## 2018-03-01 LAB — CBC
HEMATOCRIT: 41.9 % (ref 39.0–52.0)
HEMOGLOBIN: 13.1 g/dL (ref 13.0–17.0)
MCH: 30.5 pg (ref 26.0–34.0)
MCHC: 31.3 g/dL (ref 30.0–36.0)
MCV: 97.7 fL (ref 78.0–100.0)
Platelets: 92 10*3/uL — ABNORMAL LOW (ref 150–400)
RBC: 4.29 MIL/uL (ref 4.22–5.81)
RDW: 13.7 % (ref 11.5–15.5)
WBC: 5.2 10*3/uL (ref 4.0–10.5)

## 2018-03-01 LAB — PROTIME-INR
INR: 1.15
Prothrombin Time: 14.7 seconds (ref 11.4–15.2)

## 2018-03-01 LAB — PHOSPHORUS: Phosphorus: 3.1 mg/dL (ref 2.5–4.6)

## 2018-03-01 LAB — TSH: TSH: 1.799 u[IU]/mL (ref 0.350–4.500)

## 2018-03-02 LAB — HEMOGLOBIN A1C
Hgb A1c MFr Bld: 4.9 % (ref 4.8–5.6)
Mean Plasma Glucose: 94 mg/dL

## 2018-03-02 LAB — URINE CULTURE: Culture: NO GROWTH

## 2018-03-02 NOTE — Telephone Encounter (Signed)
Patient wife called and states that the nurse wanted to know the update on the patient.  They have transferred him from [facility] to [facility] [place]. He has cellulitis in his leg. They are expecting him to be in the hospital for 2-3 weeks. They are working on OT, PT and speech . If you need any further information please call wife [phone].

## 2018-03-03 ENCOUNTER — Other Ambulatory Visit (HOSPITAL_COMMUNITY): Payer: Self-pay

## 2018-03-03 LAB — CBC
HEMATOCRIT: 42.9 % (ref 39.0–52.0)
HEMOGLOBIN: 13.6 g/dL (ref 13.0–17.0)
MCH: 30.6 pg (ref 26.0–34.0)
MCHC: 31.7 g/dL (ref 30.0–36.0)
MCV: 96.4 fL (ref 78.0–100.0)
Platelets: 96 10*3/uL — ABNORMAL LOW (ref 150–400)
RBC: 4.45 MIL/uL (ref 4.22–5.81)
RDW: 13.3 % (ref 11.5–15.5)
WBC: 5.7 10*3/uL (ref 4.0–10.5)

## 2018-03-03 LAB — VANCOMYCIN, TROUGH: Vancomycin Tr: 17 ug/mL (ref 15–20)

## 2018-03-04 ENCOUNTER — Other Ambulatory Visit: Payer: Self-pay | Admitting: Internal Medicine

## 2018-03-04 DIAGNOSIS — L219 Seborrheic dermatitis, unspecified: Secondary | ICD-10-CM

## 2018-03-04 LAB — BASIC METABOLIC PANEL
ANION GAP: 9 (ref 5–15)
BUN: 12 mg/dL (ref 8–23)
CALCIUM: 8.9 mg/dL (ref 8.9–10.3)
CO2: 29 mmol/L (ref 22–32)
CREATININE: 0.68 mg/dL (ref 0.61–1.24)
Chloride: 102 mmol/L (ref 98–111)
GFR calc non Af Amer: >60 mL/min (ref >60)
Glucose, Bld: 123 mg/dL — ABNORMAL HIGH (ref 70–99)
Potassium: 5.8 mmol/L — ABNORMAL HIGH (ref 3.5–5.1)
Sodium: 140 mmol/L (ref 135–145)

## 2018-03-04 LAB — MAGNESIUM: Magnesium: 1.7 mg/dL (ref 1.7–2.4)

## 2018-03-05 ENCOUNTER — Telehealth: Payer: Self-pay | Admitting: *Deleted

## 2018-03-05 LAB — CULTURE, BLOOD (ROUTINE X 2)
Culture: NO GROWTH
Culture: NO GROWTH

## 2018-03-05 LAB — MAGNESIUM: Magnesium: 2 mg/dL (ref 1.7–2.4)

## 2018-03-05 LAB — POTASSIUM: POTASSIUM: 4.9 mmol/L (ref 3.5–5.1)

## 2018-03-05 NOTE — Telephone Encounter (Signed)
Received Physician Orders from [facility], attempted to call and update provider's RTO, on hold 5 minutes; will  forward to provider upon RTO/SLS 08/15

## 2018-03-06 LAB — CBC
HEMATOCRIT: 44.2 % (ref 39.0–52.0)
HEMOGLOBIN: 13.7 g/dL (ref 13.0–17.0)
MCH: 30 pg (ref 26.0–34.0)
MCHC: 31 g/dL (ref 30.0–36.0)
MCV: 96.7 fL (ref 78.0–100.0)
Platelets: 158 10*3/uL (ref 150–400)
RBC: 4.57 MIL/uL (ref 4.22–5.81)
RDW: 13.4 % (ref 11.5–15.5)
WBC: 8.1 10*3/uL (ref 4.0–10.5)

## 2018-03-06 LAB — PROTIME-INR
INR: 1.02
PROTHROMBIN TIME: 13.3 s (ref 11.4–15.2)

## 2018-03-09 LAB — BASIC METABOLIC PANEL
ANION GAP: 7 (ref 5–15)
BUN: 18 mg/dL (ref 8–23)
CALCIUM: 8.9 mg/dL (ref 8.9–10.3)
CO2: 33 mmol/L — AB (ref 22–32)
CREATININE: 0.61 mg/dL (ref 0.61–1.24)
Chloride: 98 mmol/L (ref 98–111)
GFR calc non Af Amer: >60 mL/min (ref >60)
GLUCOSE: 76 mg/dL (ref 70–99)
Potassium: 4.5 mmol/L (ref 3.5–5.1)
Sodium: 138 mmol/L (ref 135–145)

## 2018-03-09 LAB — CBC
HCT: 42.2 % (ref 39.0–52.0)
HEMOGLOBIN: 13 g/dL (ref 13.0–17.0)
MCH: 30 pg (ref 26.0–34.0)
MCHC: 30.8 g/dL (ref 30.0–36.0)
MCV: 97.2 fL (ref 78.0–100.0)
PLATELETS: 137 10*3/uL — AB (ref 150–400)
RBC: 4.34 MIL/uL (ref 4.22–5.81)
RDW: 13.4 % (ref 11.5–15.5)
WBC: 9.4 10*3/uL (ref 4.0–10.5)

## 2018-03-09 LAB — MAGNESIUM: MAGNESIUM: 1.7 mg/dL (ref 1.7–2.4)

## 2018-03-10 LAB — BASIC METABOLIC PANEL
Anion gap: 10 (ref 5–15)
BUN: 16 mg/dL (ref 8–23)
CALCIUM: 9.4 mg/dL (ref 8.9–10.3)
CHLORIDE: 97 mmol/L — AB (ref 98–111)
CO2: 33 mmol/L — AB (ref 22–32)
CREATININE: 0.67 mg/dL (ref 0.61–1.24)
GFR calc non Af Amer: >60 mL/min (ref >60)
Glucose, Bld: 94 mg/dL (ref 70–99)
Potassium: 4.8 mmol/L (ref 3.5–5.1)
SODIUM: 140 mmol/L (ref 135–145)

## 2018-03-10 NOTE — Telephone Encounter (Signed)
Orders signed on PCP return to office- forms faxed to [facility] at [phone]. Form sent for scanning.

## 2018-03-11 LAB — BASIC METABOLIC PANEL
ANION GAP: 7 (ref 5–15)
BUN: 19 mg/dL (ref 8–23)
CALCIUM: 8.7 mg/dL — AB (ref 8.9–10.3)
CHLORIDE: 99 mmol/L (ref 98–111)
CO2: 32 mmol/L (ref 22–32)
CREATININE: 0.69 mg/dL (ref 0.61–1.24)
GFR calc non Af Amer: >60 mL/min (ref >60)
Glucose, Bld: 95 mg/dL (ref 70–99)
Potassium: 5.2 mmol/L — ABNORMAL HIGH (ref 3.5–5.1)
SODIUM: 138 mmol/L (ref 135–145)

## 2018-03-11 LAB — PHOSPHORUS: Phosphorus: 3.4 mg/dL (ref 2.5–4.6)

## 2018-03-11 LAB — CBC
HCT: 45.2 % (ref 39.0–52.0)
HEMOGLOBIN: 14 g/dL (ref 13.0–17.0)
MCH: 30 pg (ref 26.0–34.0)
MCHC: 31 g/dL (ref 30.0–36.0)
MCV: 97 fL (ref 78.0–100.0)
Platelets: 131 10*3/uL — ABNORMAL LOW (ref 150–400)
RBC: 4.66 MIL/uL (ref 4.22–5.81)
RDW: 13.5 % (ref 11.5–15.5)
WBC: 6.7 10*3/uL (ref 4.0–10.5)

## 2018-03-11 LAB — MAGNESIUM: Magnesium: 2.3 mg/dL (ref 1.7–2.4)

## 2018-03-12 LAB — POTASSIUM: POTASSIUM: 4.7 mmol/L (ref 3.5–5.1)

## 2018-03-16 ENCOUNTER — Telehealth: Payer: Self-pay | Admitting: Pulmonary Disease

## 2018-03-16 LAB — BASIC METABOLIC PANEL
ANION GAP: 8 (ref 5–15)
BUN: 20 mg/dL (ref 8–23)
CO2: 34 mmol/L — AB (ref 22–32)
Calcium: 9.4 mg/dL (ref 8.9–10.3)
Chloride: 97 mmol/L — ABNORMAL LOW (ref 98–111)
Creatinine, Ser: 0.65 mg/dL (ref 0.61–1.24)
Glucose, Bld: 75 mg/dL (ref 70–99)
Potassium: 5 mmol/L (ref 3.5–5.1)
SODIUM: 139 mmol/L (ref 135–145)

## 2018-03-16 LAB — PHOSPHORUS: Phosphorus: 3.5 mg/dL (ref 2.5–4.6)

## 2018-03-16 LAB — CBC
HCT: 45.1 % (ref 39.0–52.0)
HEMOGLOBIN: 14 g/dL (ref 13.0–17.0)
MCH: 30 pg (ref 26.0–34.0)
MCHC: 31 g/dL (ref 30.0–36.0)
MCV: 96.8 fL (ref 78.0–100.0)
Platelets: 142 10*3/uL — ABNORMAL LOW (ref 150–400)
RBC: 4.66 MIL/uL (ref 4.22–5.81)
RDW: 13.2 % (ref 11.5–15.5)
WBC: 6.5 10*3/uL (ref 4.0–10.5)

## 2018-03-16 LAB — MAGNESIUM: Magnesium: 1.7 mg/dL (ref 1.7–2.4)

## 2018-03-16 NOTE — Telephone Encounter (Signed)
Called Shaun Yoder unable to reach left message to give [place] a call back.

## 2018-03-17 NOTE — Telephone Encounter (Signed)
Attempted to call [id number] with [facility] but unable to reach him and unable to leave a message due to VM not being set up. Will try to call back later.

## 2018-03-18 NOTE — Telephone Encounter (Signed)
ATC Ashley. She did not answer and the VM was not setup. Will attempt to call back later.

## 2018-03-18 NOTE — Progress Notes (Signed)
@Patient  ID: Shaun Yoder, male    DOB: 11/05/1945, 72 y.o.   MRN: [id number]  Chief Complaint  Patient presents with  . Hospitalization Follow-up    breathing is ok, needs bipap instead of CPAP, needs BIPAP tiration    Referring provider: Colon Branch, MD  HPI: 72 year old male patient followed in our office for obstructive sleep apnea, chronic oxygen dependent respiratory failure, obesity hypoventilation syndrome  Smoker/ Smoking History: Former smoker.  Quit in 2000.  107-pack-year. Pt of: Dr. Elsworth Soho  03/19/2018  - Visit   72 year old patient presents office visit today.  [facility]  Insurance informed patient that he needs to have a BiPAP titration study.  Patient reports he completed PCP follow-up today on 03/19/2018.  PCP to refer patient to wound center in [facility].  Patient also reports the [place] in [place] is coordinating DME supplies.  Patient reports he is been doing well.  Feels well since being discharged from the hospital.  Patient reports compliance with medications as well as with follow-ups to specialty visits.    Allergies  Allergen Reactions  . Leflunomide Other (See Comments)    diarrhea  . Ancef [Cefazolin]     Rash   . Morphine And Related Nausea And Vomiting  . Plaquenil [Hydroxychloroquine Sulfate]     rash    Immunization History  Administered Date(s) Administered  . Influenza Split 04/12/2017  . Influenza, High Dose Seasonal PF 05/23/2015, 04/03/2016  . Influenza-Unspecified 04/21/2014  . Pneumococcal Conjugate-13 04/21/2014  . Pneumococcal Polysaccharide-23 05/23/2015  . Tdap 11/19/2010    Past Medical History:  Diagnosis Date  . Anemia   . Cancer ([place])    bladder  . Chronic venous insufficiency   . Colitis   . Colon polyps   . Depression   . Diverticulosis   . DVT of deep femoral vein ([place])   . Gastritis   . GERD (gastroesophageal reflux disease)     . History of rectal polyps   . Hyperlipidemia   . Hypertension   . Iron deficiency   . Low back pain   . Lymphedema of both lower extremities   . OA (osteoarthritis)   . Osteonecrosis of shoulder region ([place])   . Schizoaffective disorder, bipolar type ([place])   . Sleep apnea   . Varicose vein     Tobacco History: Social History   Tobacco Use  Smoking Status Former Smoker  . Packs/day: 2.50  . Years: 43.00  . Pack years: 107.50  . Types: Cigarettes  . Last attempt to quit: 07/22/1998  . Years since quitting: 19.6  Smokeless Tobacco Never Used   Counseling given: Not Answered Continue not smoking.  Outpatient Encounter Medications as of 03/19/2018  Medication Sig  . apixaban (ELIQUIS) 5 MG TABS tablet Take 2.5 mg by mouth 2 (two) times daily.  . ARIPiprazole (ABILIFY) 30 MG tablet Take 30 mg by mouth at bedtime.    [name] aspirin EC 81 MG tablet Take 81 mg by mouth daily.  [name] augmented betamethasone dipropionate (DIPROLENE-AF) 0.05 % cream Apply topically 2 (two) times daily. Apply to the rash of the right side of the face  . calcium-vitamin D (CALCIUM 500/D) 500-200 MG-UNIT tablet Take 1 tablet by mouth daily with breakfast.   . divalproex (DEPAKOTE ER) 500 MG 24 hr tablet Take 1,500 mg by mouth daily. 3 tabs at bedtime  . divalproex (DEPAKOTE) 250 MG DR tablet Take 250 mg  by mouth daily with lunch.   . doxycycline (VIBRA-TABS) 100 MG tablet Take 1 tablet (100 mg total) by mouth 2 (two) times daily.  . folic acid (FOLVITE) 1 MG tablet Take 1 mg by mouth daily.    . furosemide (LASIX) 20 MG tablet Take 1 tablet (20 mg total) by mouth daily.  [name] ketoconazole (NIZORAL) 2 % shampoo LATHER AND MASSAGE INTO SCALP 2-3 TIMES PER WEEK. LEAVE FOR 5 MINUTES BEFORE RINSING.  . Melatonin 3 MG TABS Take 3 mg by mouth at bedtime.  . mirabegron ER (MYRBETRIQ) 50 MG TB24 tablet Take 50 mg by mouth daily.  . Omega-3 1000 MG CAPS Take 1 capsule by mouth daily.   [name] omeprazole (PRILOSEC) 20 MG capsule  Take 20 mg by mouth every morning.   [name] oxybutynin (DITROPAN) 5 MG tablet Take 5 mg by mouth 2 (two) times daily.  . OXYGEN Inhale into the lungs. To use if O2 drops below 85 %  . predniSONE (DELTASONE) 5 MG tablet Take 5 mg by mouth daily with breakfast.  . Tofacitinib Citrate (XELJANZ XR) 11 MG TB24 Take 1 tablet by mouth daily.  . traMADol (ULTRAM) 50 MG tablet Take 1 tablet (50 mg total) by mouth every 6 (six) hours as needed.  . vitamin C (ASCORBIC ACID) 500 MG tablet Take 500 mg by mouth daily.  . ziprasidone (GEODON) 80 MG capsule Take 80 mg by mouth daily.  . [DISCONTINUED] acetaminophen-codeine (TYLENOL #3) 300-30 MG tablet Take 1 tablet by mouth every 4 (four) hours as needed for moderate pain.   No facility-administered encounter medications on file as of 03/19/2018.      Review of Systems  Review of Systems  Constitutional: Negative for activity change, chills, fatigue, fever and unexpected weight change.  HENT: Negative for congestion, postnasal drip and sore throat.   Respiratory: Negative for cough, shortness of breath and wheezing.   Cardiovascular: Negative for chest pain and palpitations.  Gastrointestinal: Negative for constipation, diarrhea, nausea and vomiting.  Genitourinary: Negative for hematuria and urgency.  Musculoskeletal: Positive for gait problem (in wheelchair with cane ). Negative for arthralgias.  Skin: Positive for wound (multiple wounds, wrapped, seen at PCP office today, referred to [facility] wound center).  Neurological: Negative for dizziness, seizures and headaches.  Psychiatric/Behavioral: Negative for dysphoric mood. The patient is not nervous/anxious.   All other systems reviewed and are negative.    Physical Exam  BP 90/60 (BP Location: Right Arm, Cuff Size: Normal)   Pulse 89   Ht 5\' 1"  (1.549 m)   Wt 210 lb (95.3 kg)   SpO2 90%   BMI 39.68 kg/m   Wt Readings from Last 5 Encounters:  03/19/18 210 lb (95.3 kg)  03/19/18 210 lb (95.3 kg)    02/17/18 209 lb 8 oz (95 kg)  01/02/18 207 lb (93.9 kg)  12/25/17 207 lb (93.9 kg)    Physical Exam  Constitutional: He is oriented to person, place, and time and well-developed, well-nourished, and in no distress. No distress.  HENT:  Head: Normocephalic and atraumatic.  Right Ear: Hearing, tympanic membrane, external ear and ear canal normal.  Left Ear: Hearing, tympanic membrane, external ear and ear canal normal.  Nose: Nose normal. Right sinus exhibits no maxillary sinus tenderness and no frontal sinus tenderness. Left sinus exhibits no maxillary sinus tenderness and no frontal sinus tenderness.  Mouth/Throat: Uvula is midline and oropharynx is clear and moist. No oropharyngeal exudate.  Eyes: Pupils are equal, round, and reactive  to light.  Neck: Normal range of motion. Neck supple. No JVD present.  Cardiovascular: Normal rate, regular rhythm and normal heart sounds.  Pulmonary/Chest: Effort normal and breath sounds normal. No accessory muscle usage. No respiratory distress. He has no decreased breath sounds. He has no wheezes. He has no rhonchi.  Abdominal: Soft. Bowel sounds are normal. There is no tenderness.  Musculoskeletal: Normal range of motion. He exhibits edema (2+ LE bilaterally ).  In wheelchair   Lymphadenopathy:    He has no cervical adenopathy.  Neurological: He is alert and oriented to person, place, and time.  Skin: He is not diaphoretic. No erythema.  Multiple wrapped leg wounds  Psychiatric: Mood, memory, affect and judgment normal.  Nursing note and vitals reviewed.     Lab Results:  CBC    Component Value Date/Time   WBC 6.5 03/16/2018 0656   RBC 4.66 03/16/2018 0656   HGB 14.0 03/16/2018 0656   HGB 13.2 06/09/2017 1335   HCT 45.1 03/16/2018 0656   HCT 38.3 (L) 06/09/2017 1335   PLT 142 (L) 03/16/2018 0656   PLT 114 (L) 06/09/2017 1335   MCV 96.8 03/16/2018 0656   MCV 95 06/09/2017 1335   MCH 30.0 03/16/2018 0656   MCHC 31.0 03/16/2018 0656    RDW 13.2 03/16/2018 0656   RDW 15.0 06/09/2017 1335   LYMPHSABS 1.9 01/05/2018 1614   LYMPHSABS 1.1 06/09/2017 1335   MONOABS 0.7 01/05/2018 1614   EOSABS 0.0 01/05/2018 1614   EOSABS 0.0 06/09/2017 1335   BASOSABS 0.0 01/05/2018 1614   BASOSABS 0.0 06/09/2017 1335    BMET    Component Value Date/Time   NA 139 03/16/2018 0656   NA 145 05/05/2017 1345   K 5.0 03/16/2018 0656   K 4.4 05/05/2017 1345   CL 97 (L) 03/16/2018 0656   CL 104 05/05/2017 1345   CO2 34 (H) 03/16/2018 0656   CO2 31 05/05/2017 1345   GLUCOSE 75 03/16/2018 0656   GLUCOSE 95 05/05/2017 1345   BUN 20 03/16/2018 0656   BUN 17 05/05/2017 1345   CREATININE 0.65 03/16/2018 0656   CREATININE 0.9 05/05/2017 1345   CALCIUM 9.4 03/16/2018 0656   CALCIUM 9.3 05/05/2017 1345   GFRNONAA >60 03/16/2018 0656   GFRAA >60 03/16/2018 0656    BNP    Component Value Date/Time   BNP 266.4 (H) 12/24/2017 2031    ProBNP No results found for: PROBNP  Imaging: Dg Chest Port 1 View  Result Date: 02/28/2018 CLINICAL DATA:  Respiratory failure per ordering notes. Patient reports he was transferred here from [facility] earlier today. Denies current chest pain or SOB. Reports he was admitted into [facility] for a leg wound. Hx of HTN. Former smoker- quit in 2000. EXAM: PORTABLE CHEST 1 VIEW COMPARISON:  02/26/2018 and older exams. FINDINGS: Cardiac silhouette is partly obscured, but appears mildly enlarged. No mediastinal or hilar masses. Lung volumes are low. There are bibasilar opacities consistent with atelectasis. Remainder of the lungs is clear. No convincing pleural effusion.  No pneumothorax. There are advanced arthropathic changes of both shoulders. IMPRESSION: 1. By basilar lung opacities most consistent with atelectasis. Consider a component of pneumonia if there are consistent clinical findings. No significant change from the most recent prior exam. Electronically Signed   By: Lajean Manes M.D.    On: 02/28/2018 18:40       Assessment & Plan:   Pleasant 72 year old patient seen for hospital follow-up today.  Patient  to continue using BiPAP.  Will order BiPAP titration study to be completed in our sleep lab.  Patient to continue oxygen use as prescribed.  Patient to follow-up with Dr. Elsworth Soho in 4 to 6 weeks.  Nocturnal hypoxemia Continue 2 L at night  Chronic respiratory failure with hypoxia (HCC) Continue wearing her oxygen  >>>2 L with exertion  >>>2 L via BiPAP at night  You can purchase an SPO2 monitor from your pharmacy to check oxygen levels >>>I would like for your oxygen levels to remain above 90%  Follow-up with Dr. Elsworth Soho in 4 to 6 weeks    Sleep apnea We will order BiPAP titration study to be done at the sleep lab >>>Proceed to the patient care coordinators they will schedule this for you today  Continue using your BiPAP nightly  Follow-up with Dr. Elsworth Soho in 4 to 6 weeks       Lauraine Rinne, NP 03/19/2018

## 2018-03-19 ENCOUNTER — Ambulatory Visit (INDEPENDENT_AMBULATORY_CARE_PROVIDER_SITE_OTHER): Payer: Medicare Other | Admitting: Pulmonary Disease

## 2018-03-19 ENCOUNTER — Encounter: Payer: Self-pay | Admitting: Internal Medicine

## 2018-03-19 ENCOUNTER — Ambulatory Visit (INDEPENDENT_AMBULATORY_CARE_PROVIDER_SITE_OTHER): Payer: Medicare Other | Admitting: Internal Medicine

## 2018-03-19 ENCOUNTER — Encounter: Payer: Self-pay | Admitting: Pulmonary Disease

## 2018-03-19 VITALS — BP 121/72 | HR 80 | Temp 98.5°F | Resp 16 | Ht 61.0 in | Wt 210.0 lb

## 2018-03-19 VITALS — BP 90/60 | HR 89 | Ht 61.0 in | Wt 210.0 lb

## 2018-03-19 DIAGNOSIS — R269 Unspecified abnormalities of gait and mobility: Secondary | ICD-10-CM

## 2018-03-19 DIAGNOSIS — A419 Sepsis, unspecified organism: Secondary | ICD-10-CM | POA: Diagnosis not present

## 2018-03-19 DIAGNOSIS — L03116 Cellulitis of left lower limb: Secondary | ICD-10-CM | POA: Diagnosis not present

## 2018-03-19 DIAGNOSIS — J9611 Chronic respiratory failure with hypoxia: Secondary | ICD-10-CM

## 2018-03-19 DIAGNOSIS — G4734 Idiopathic sleep related nonobstructive alveolar hypoventilation: Secondary | ICD-10-CM | POA: Diagnosis not present

## 2018-03-19 DIAGNOSIS — G4733 Obstructive sleep apnea (adult) (pediatric): Secondary | ICD-10-CM

## 2018-03-19 DIAGNOSIS — G4730 Sleep apnea, unspecified: Secondary | ICD-10-CM | POA: Diagnosis not present

## 2018-03-19 DIAGNOSIS — S31000A Unspecified open wound of lower back and pelvis without penetration into retroperitoneum, initial encounter: Secondary | ICD-10-CM | POA: Diagnosis not present

## 2018-03-19 DIAGNOSIS — R296 Repeated falls: Secondary | ICD-10-CM

## 2018-03-19 MED ORDER — DOXYCYCLINE HYCLATE 100 MG PO TABS: 100.0000 mg | ORAL_TABLET | Freq: Two times a day (BID) | ORAL | 0 refills | Status: DC

## 2018-03-19 NOTE — Telephone Encounter (Signed)
We have attempted to contact Shaun Yoder several times with no success. Per triage protocol, message will be closed.

## 2018-03-19 NOTE — Assessment & Plan Note (Signed)
Continue wearing her oxygen  >>>2 L with exertion  >>>2 L via BiPAP at night  You can purchase an SPO2 monitor from your pharmacy to check oxygen levels >>>I would like for your oxygen levels to remain above 90%  Follow-up with Dr. Elsworth Soho in 4 to 6 weeks
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
## 2018-03-19 NOTE — Assessment & Plan Note (Signed)
We will order BiPAP titration study to be done at the sleep lab >>>Proceed to the patient care coordinators they will schedule this for you today  Continue using your BiPAP nightly  Follow-up with Dr. Elsworth Soho in 4 to 6 weeks

## 2018-03-19 NOTE — Assessment & Plan Note (Signed)
Continue 2 L at night

## 2018-03-19 NOTE — Patient Instructions (Signed)
Next office visit in 1 month, please make an appointment  I am sending 1 week of additional doxycycline  We are asking PT/OT to visit you again, if they do not contact you within 2 to 3 days let me know  I am referring you back to the wound care center  For the pressure ulcer: Frequent turning [place]  skin Call if you get worse.
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
## 2018-03-19 NOTE — Patient Instructions (Addendum)
We will order BiPAP titration study to be done at the sleep lab >>>Proceed to the patient care coordinators they will schedule this for you today  Continue using your BiPAP nightly  Continue wearing her oxygen  >>>2 L with exertion  >>>2 L via BiPAP at night  You can purchase an SPO2 monitor from your pharmacy to check oxygen levels >>>I would like for your oxygen levels to remain above 90%   Follow-up with Dr. Elsworth Soho in 4 to 6 weeks   It is flu season:   >>>Remember to be washing your hands regularly, using hand sanitizer, be careful to use around herself with has contact with people who are sick will increase her chances of getting sick yourself. >>> Best ways to protect herself from the flu: Receive the yearly flu vaccine, practice good hand hygiene washing with soap and also using hand sanitizer when available, eat a nutritious meals, get adequate rest, hydrate appropriately     Please contact the office if your symptoms worsen or you have concerns that you are not improving.   Thank you for choosing [facility] for your healthcare, and for allowing [place] to partner with you on your healthcare journey. I am thankful to be able to provide care to you today.   Wyn Quaker FNP-C

## 2018-03-20 ENCOUNTER — Ambulatory Visit (HOSPITAL_BASED_OUTPATIENT_CLINIC_OR_DEPARTMENT_OTHER): Payer: Medicare Other | Attending: Pulmonary Disease | Admitting: Pulmonary Disease

## 2018-03-20 VITALS — Ht 61.0 in | Wt 210.0 lb

## 2018-03-20 DIAGNOSIS — G4733 Obstructive sleep apnea (adult) (pediatric): Secondary | ICD-10-CM | POA: Diagnosis not present

## 2018-03-24 ENCOUNTER — Telehealth: Payer: Self-pay

## 2018-03-24 ENCOUNTER — Other Ambulatory Visit (HOSPITAL_BASED_OUTPATIENT_CLINIC_OR_DEPARTMENT_OTHER): Payer: Self-pay

## 2018-03-24 DIAGNOSIS — G4733 Obstructive sleep apnea (adult) (pediatric): Secondary | ICD-10-CM

## 2018-03-24 NOTE — Telephone Encounter (Signed)
Copied from [place] [id number]. Topic: Referral - Question >> Mar 24, 2018  9:56 AM Reyne Dumas L wrote: Reason for CRM:   Pt's wife calling in.  States that they have always used [facility] for their needs and the referral that was sent for pt went to [facility].  Pt's wife would like the PT and OT referral sent to [facility]. Arlene can be reached at [phone]

## 2018-03-25 NOTE — Telephone Encounter (Signed)
Okay to change to [facility]

## 2018-03-25 NOTE — Telephone Encounter (Signed)
Please advise 

## 2018-03-25 NOTE — Telephone Encounter (Signed)
Can this be changed to [facility].

## 2018-03-26 NOTE — Telephone Encounter (Signed)
Sent to [facility]

## 2018-03-26 NOTE — Telephone Encounter (Signed)
Noted  

## 2018-03-30 ENCOUNTER — Telehealth: Payer: Self-pay

## 2018-03-30 ENCOUNTER — Telehealth: Payer: Self-pay | Admitting: Internal Medicine

## 2018-03-30 DIAGNOSIS — A419 Sepsis, unspecified organism: Secondary | ICD-10-CM | POA: Diagnosis not present

## 2018-03-30 DIAGNOSIS — M069 Rheumatoid arthritis, unspecified: Secondary | ICD-10-CM | POA: Diagnosis not present

## 2018-03-30 DIAGNOSIS — R296 Repeated falls: Secondary | ICD-10-CM | POA: Diagnosis not present

## 2018-03-30 DIAGNOSIS — Z9981 Dependence on supplemental oxygen: Secondary | ICD-10-CM | POA: Diagnosis not present

## 2018-03-30 DIAGNOSIS — L03116 Cellulitis of left lower limb: Secondary | ICD-10-CM | POA: Diagnosis not present

## 2018-03-30 DIAGNOSIS — I10 Essential (primary) hypertension: Secondary | ICD-10-CM | POA: Diagnosis not present

## 2018-03-30 DIAGNOSIS — Z86718 Personal history of other venous thrombosis and embolism: Secondary | ICD-10-CM | POA: Diagnosis not present

## 2018-03-30 DIAGNOSIS — E785 Hyperlipidemia, unspecified: Secondary | ICD-10-CM | POA: Diagnosis not present

## 2018-03-30 DIAGNOSIS — Z7901 Long term (current) use of anticoagulants: Secondary | ICD-10-CM | POA: Diagnosis not present

## 2018-03-30 DIAGNOSIS — Z96651 Presence of right artificial knee joint: Secondary | ICD-10-CM | POA: Diagnosis not present

## 2018-03-30 DIAGNOSIS — I350 Nonrheumatic aortic (valve) stenosis: Secondary | ICD-10-CM | POA: Diagnosis not present

## 2018-03-30 DIAGNOSIS — Z96652 Presence of left artificial knee joint: Secondary | ICD-10-CM | POA: Diagnosis not present

## 2018-03-30 DIAGNOSIS — Z87891 Personal history of nicotine dependence: Secondary | ICD-10-CM | POA: Diagnosis not present

## 2018-03-30 DIAGNOSIS — Z7984 Long term (current) use of oral hypoglycemic drugs: Secondary | ICD-10-CM | POA: Diagnosis not present

## 2018-03-30 DIAGNOSIS — F259 Schizoaffective disorder, unspecified: Secondary | ICD-10-CM | POA: Diagnosis not present

## 2018-03-30 DIAGNOSIS — I872 Venous insufficiency (chronic) (peripheral): Secondary | ICD-10-CM | POA: Diagnosis not present

## 2018-03-30 DIAGNOSIS — D649 Anemia, unspecified: Secondary | ICD-10-CM | POA: Diagnosis not present

## 2018-03-30 NOTE — Telephone Encounter (Signed)
Please advise 

## 2018-03-30 NOTE — Telephone Encounter (Signed)
Ok to all, thx

## 2018-03-30 NOTE — Telephone Encounter (Signed)
Copied from [place] [phone]. Topic: Quick Communication - See Telephone Encounter >> Mar 30, 2018 10:39 AM Reyne Dumas L wrote: CRM for notification. See Telephone encounter for: 03/30/18.  Jim from [facility] calling to get verbal orders for PT for functional mobility and fall risk reduction:  2x a week for 4 weeks Evaluation for: OT for ADLs:   ST for speech and swallowing Nursing for history of wounds, hospitalizations and change in medicine. Clair Gulling can be reached at [phone], OK to leave a message

## 2018-03-30 NOTE — Telephone Encounter (Signed)
LMOM w/ orders.

## 2018-03-30 NOTE — Telephone Encounter (Signed)
Multiple orders received from [facility]. Orders signed and faxed to [phone]. Forms sent for scanning.

## 2018-03-31 ENCOUNTER — Telehealth: Payer: Self-pay | Admitting: Internal Medicine

## 2018-03-31 ENCOUNTER — Telehealth: Payer: Self-pay | Admitting: *Deleted

## 2018-03-31 DIAGNOSIS — M069 Rheumatoid arthritis, unspecified: Secondary | ICD-10-CM | POA: Diagnosis not present

## 2018-03-31 DIAGNOSIS — I872 Venous insufficiency (chronic) (peripheral): Secondary | ICD-10-CM | POA: Diagnosis not present

## 2018-03-31 DIAGNOSIS — I10 Essential (primary) hypertension: Secondary | ICD-10-CM | POA: Diagnosis not present

## 2018-03-31 DIAGNOSIS — A419 Sepsis, unspecified organism: Secondary | ICD-10-CM | POA: Diagnosis not present

## 2018-03-31 DIAGNOSIS — L03116 Cellulitis of left lower limb: Secondary | ICD-10-CM | POA: Diagnosis not present

## 2018-03-31 DIAGNOSIS — R296 Repeated falls: Secondary | ICD-10-CM | POA: Diagnosis not present

## 2018-03-31 NOTE — Telephone Encounter (Signed)
Received Physician Orders from [facility]; forwarded to provider/SLS 09/10

## 2018-03-31 NOTE — Telephone Encounter (Signed)
Copied from [place] [id number]. Topic: Quick Communication - See Telephone Encounter >> Mar 31, 2018  2:52 PM Synthia Innocent wrote: CRM for notification. See Telephone encounter for: 03/31/18. [place] Requesting verbal orders for plan of care for speech therapy. 1x a week for 4 weeks swallowing and communications

## 2018-03-31 NOTE — Telephone Encounter (Signed)
Already given orders to [facility].

## 2018-04-01 DIAGNOSIS — A419 Sepsis, unspecified organism: Secondary | ICD-10-CM | POA: Diagnosis not present

## 2018-04-01 DIAGNOSIS — R296 Repeated falls: Secondary | ICD-10-CM | POA: Diagnosis not present

## 2018-04-01 DIAGNOSIS — M069 Rheumatoid arthritis, unspecified: Secondary | ICD-10-CM | POA: Diagnosis not present

## 2018-04-01 DIAGNOSIS — I10 Essential (primary) hypertension: Secondary | ICD-10-CM | POA: Diagnosis not present

## 2018-04-01 DIAGNOSIS — I890 Lymphedema, not elsewhere classified: Secondary | ICD-10-CM | POA: Diagnosis not present

## 2018-04-01 DIAGNOSIS — L03116 Cellulitis of left lower limb: Secondary | ICD-10-CM | POA: Diagnosis not present

## 2018-04-01 DIAGNOSIS — I87312 Chronic venous hypertension (idiopathic) with ulcer of left lower extremity: Secondary | ICD-10-CM | POA: Diagnosis not present

## 2018-04-01 DIAGNOSIS — L97821 Non-pressure chronic ulcer of other part of left lower leg limited to breakdown of skin: Secondary | ICD-10-CM | POA: Diagnosis not present

## 2018-04-01 DIAGNOSIS — I872 Venous insufficiency (chronic) (peripheral): Secondary | ICD-10-CM | POA: Diagnosis not present

## 2018-04-01 DIAGNOSIS — L97221 Non-pressure chronic ulcer of left calf limited to breakdown of skin: Secondary | ICD-10-CM | POA: Diagnosis not present

## 2018-04-02 DIAGNOSIS — M79671 Pain in right foot: Secondary | ICD-10-CM | POA: Diagnosis not present

## 2018-04-02 DIAGNOSIS — M79672 Pain in left foot: Secondary | ICD-10-CM | POA: Diagnosis not present

## 2018-04-02 DIAGNOSIS — B351 Tinea unguium: Secondary | ICD-10-CM | POA: Diagnosis not present

## 2018-04-02 NOTE — Telephone Encounter (Signed)
VO given to [place], [place] for ST.

## 2018-04-03 DIAGNOSIS — M25521 Pain in right elbow: Secondary | ICD-10-CM | POA: Diagnosis not present

## 2018-04-06 ENCOUNTER — Other Ambulatory Visit: Payer: Self-pay | Admitting: Pulmonary Disease

## 2018-04-06 DIAGNOSIS — I872 Venous insufficiency (chronic) (peripheral): Secondary | ICD-10-CM | POA: Diagnosis not present

## 2018-04-06 DIAGNOSIS — L03115 Cellulitis of right lower limb: Secondary | ICD-10-CM | POA: Diagnosis not present

## 2018-04-06 DIAGNOSIS — G4730 Sleep apnea, unspecified: Secondary | ICD-10-CM

## 2018-04-06 DIAGNOSIS — L03116 Cellulitis of left lower limb: Secondary | ICD-10-CM | POA: Diagnosis not present

## 2018-04-06 DIAGNOSIS — A419 Sepsis, unspecified organism: Secondary | ICD-10-CM | POA: Diagnosis not present

## 2018-04-06 DIAGNOSIS — I10 Essential (primary) hypertension: Secondary | ICD-10-CM | POA: Diagnosis not present

## 2018-04-06 DIAGNOSIS — M069 Rheumatoid arthritis, unspecified: Secondary | ICD-10-CM | POA: Diagnosis not present

## 2018-04-06 DIAGNOSIS — G4733 Obstructive sleep apnea (adult) (pediatric): Secondary | ICD-10-CM | POA: Diagnosis not present

## 2018-04-06 DIAGNOSIS — R296 Repeated falls: Secondary | ICD-10-CM | POA: Diagnosis not present

## 2018-04-06 NOTE — Progress Notes (Signed)
Please place an order with DME company to ensure patient is on the settings.  Also please place order for reevaluation sleep center 4 to 6 weeks from now under current recommendations. Please contact the patient to let them know the results of their sleep study.   See results below:   - Recommend a trial of Auto-BiPAP 12-20 cm H2O, EPAP 8-16cm, PS 4cm.  - Avoid alcohol, sedatives and other CNS depressants that may worsen sleep apnea and disrupt normal sleep architecture.   - Sleep hygiene should be reviewed to assess factors that may improve sleep quality.   - Weight management and regular exercise should be initiated or continued.   - Return to Sleep Center for re-evaluation after 4 weeks of therapy. Reassess need for oxygen

## 2018-04-06 NOTE — Progress Notes (Signed)
Called patient and was able to talk to his wife. She states they have had a BiPAP since leaving hospital on 8/26. Titration study done on 8/30. Results above. When called, Wife explains the patient is already on 2L of Oxygen into the BiPAP. Patient has a follow up appointment with Dr. Elsworth Soho on 04/21/18. Recommendation was to have a reevaluation 4 weeks after therapy to see need to Oxygen. Is this 4 weeks after settings are changed order placed today? Also does the reevaluation need to be done before follow up appointment with Elsworth Soho on the 1st of October?  IS the evaluation to see if the Oxygen can be removed from the bipap?   Looking for verification / clarification before putting in order for BiPAP titration study.   Routing to [employer] and Dr. Elsworth Soho for further assistance.

## 2018-04-06 NOTE — Procedures (Signed)
  Patient Name: Shaun, Yoder Date: 03/20/2018   Gender: Male  D.O.B: [birth year]  Age (years): 62  Referring Provider: Kara Mead MD, ABSM  Height (inches): 61  Interpreting Physician: Kara Mead MD, ABSM  Weight (lbs): 204  RPSGT: Lanae Boast  BMI: 39  MRN: [id number]  Neck Size: 18.00  <br> <br>  CLINICAL INFORMATION  The patient is referred for a BiPAP titration to treat sleep apnea. Date of HST: 05/2017, AHI 39/h SLEEP STUDY TECHNIQUE  As per the AASM Manual for the Scoring of Sleep and Associated Events v2.3 (April 2016) with a hypopnea requiring 4% desaturations. The channels recorded and monitored were frontal, central and occipital EEG, electrooculogram (EOG), submentalis EMG (chin), nasal and oral airflow, thoracic and abdominal wall motion, anterior tibialis EMG, snore microphone, electrocardiogram, and pulse oximetry. Bilevel positive airway pressure (BPAP) was initiated at the beginning of the study and titrated to treat sleep-disordered breathing. MEDICATIONS  Medications self-administered by patient taken the night of the study : MELATONIN, DEPAKOTE, ABILIFY, GEODON RESPIRATORY PARAMETERS  Optimal IPAP Pressure (cm): 20    AHI at Optimal Pressure (/hr) 20/h   Optimal EPAP Pressure (cm):16         Overall Minimal O2 (%): 67.0 Minimal O2 at Optimal Pressure (%): 83   SLEEP ARCHITECTURE  Start Time: 10:52:42 PM Stop Time: 5:08:57 AM Total Time (min): 376.2 Total Sleep Time (min): 250  Sleep Latency (min): 15.4 Sleep Efficiency (%): 66.4% REM Latency (min): 6.5 WASO (min): 110.9  Stage N1 (%): 25.4% Stage N2 (%): 69.4% Stage N3 (%): 0.0% Stage R (%): 5.2  Supine (%): 100.00 Arousal Index (/hr): 30.5          CARDIAC DATA  The 2 lead EKG demonstrated sinus rhythm. The mean heart rate was 60.3 beats per minute. Other EKG findings include: PVCs.  LEG MOVEMENT DATA  The total Periodic Limb Movements of Sleep (PLMS) were 0. The PLMS index was  0.0. A PLMS index of <15 is considered normal in adults. IMPRESSIONS  - An optimal PAP pressure could not be selected for this patient based on the available study data.  - Central sleep apnea was not noted during this titration (CAI = 0.2/h).  - Severe oxygen desaturations were observed during this titration (min O2 = 67.0%).  - The patient snored with soft snoring volume.  - 2-lead EKG demonstrated: PVCs  - Clinically significant periodic limb movements were not noted during this study. Arousals associated with PLMs were rare. DIAGNOSIS  - Obstructive Sleep Apnea (327.23 [G47.33 ICD-10])  - Nocturnal Hypoxemia (327.26 [G47.36 ICD-10]) RECOMMENDATIONS  - Recommend a trial of Auto-BiPAP 12-20 cm H2O, EPAP 8-16cm, PS 4cm. - Avoid alcohol, sedatives and other CNS depressants that may worsen sleep apnea and disrupt normal sleep architecture.  - Sleep hygiene should be reviewed to assess factors that may improve sleep quality.  - Weight management and regular exercise should be initiated or continued.  - Return to Sleep Center for re-evaluation after 4 weeks of therapy. Reassess need for oxygen   Kara Mead MD Board Certified in [place]

## 2018-04-07 DIAGNOSIS — I872 Venous insufficiency (chronic) (peripheral): Secondary | ICD-10-CM | POA: Diagnosis not present

## 2018-04-07 DIAGNOSIS — A419 Sepsis, unspecified organism: Secondary | ICD-10-CM | POA: Diagnosis not present

## 2018-04-07 DIAGNOSIS — M069 Rheumatoid arthritis, unspecified: Secondary | ICD-10-CM | POA: Diagnosis not present

## 2018-04-07 DIAGNOSIS — R296 Repeated falls: Secondary | ICD-10-CM | POA: Diagnosis not present

## 2018-04-07 DIAGNOSIS — L218 Other seborrheic dermatitis: Secondary | ICD-10-CM | POA: Diagnosis not present

## 2018-04-07 DIAGNOSIS — I10 Essential (primary) hypertension: Secondary | ICD-10-CM | POA: Diagnosis not present

## 2018-04-07 DIAGNOSIS — L03116 Cellulitis of left lower limb: Secondary | ICD-10-CM | POA: Diagnosis not present

## 2018-04-07 NOTE — Progress Notes (Signed)
Discussed results with Dr. Elsworth Soho today.    We will have patient complete overnight oximetry at home on room air with BiPAP settings recommended after sleep study.  Patient required higher pressures which is why he is failed CPAP and why we need coverage with BiPAP.    We will contact the patient after the overnight oximetry to see if patient does require oxygen at night.  Patient can continue to wear oxygen with the exception of the night of the overnight oximetry study where he will need to be on room air.  Keep follow-up with our office in October/1/19 with Dr. Elsworth Soho.  Wyn Quaker, FNP

## 2018-04-07 NOTE — Progress Notes (Signed)
Called patient was not able to talk to anyone. Left message to call back so I could inform them of the ONO on room air for 2-4 weeks from now (04/07/18) and Oxygen continue use except on the night of the ONO

## 2018-04-08 ENCOUNTER — Telehealth: Payer: Self-pay

## 2018-04-08 ENCOUNTER — Telehealth: Payer: Self-pay | Admitting: Pulmonary Disease

## 2018-04-08 DIAGNOSIS — A419 Sepsis, unspecified organism: Secondary | ICD-10-CM | POA: Diagnosis not present

## 2018-04-08 DIAGNOSIS — I872 Venous insufficiency (chronic) (peripheral): Secondary | ICD-10-CM | POA: Diagnosis not present

## 2018-04-08 DIAGNOSIS — L03116 Cellulitis of left lower limb: Secondary | ICD-10-CM | POA: Diagnosis not present

## 2018-04-08 DIAGNOSIS — R296 Repeated falls: Secondary | ICD-10-CM | POA: Diagnosis not present

## 2018-04-08 DIAGNOSIS — I10 Essential (primary) hypertension: Secondary | ICD-10-CM | POA: Diagnosis not present

## 2018-04-08 DIAGNOSIS — M069 Rheumatoid arthritis, unspecified: Secondary | ICD-10-CM | POA: Diagnosis not present

## 2018-04-08 NOTE — Telephone Encounter (Signed)
Copied from [place] [phone]. Topic: General - Other >> Apr 08, 2018  9:39 AM Yvette Rack wrote: Reason for CRM: Curt Jews with [facility] states pt was extremely manic during her visit with him today. Joaquim Lai stated pt was cursing at her and his wife and he was able to get out of the home with his caregiver. Joaquim Lai has now left the home so if there are any instructions she suggests to contact pt wife. Joaquim Lai can be contacted at [phone] if needed for further information.

## 2018-04-08 NOTE — Telephone Encounter (Signed)
Recommend patient and family to contact the [place], they are following him for this problem. ER if symptoms severe or if he is at risk of hurting himself or others

## 2018-04-08 NOTE — Telephone Encounter (Signed)
[place], Pt's spouse to return call. Okay for [facility] to discuss.

## 2018-04-08 NOTE — Telephone Encounter (Signed)
LMTCB

## 2018-04-08 NOTE — Telephone Encounter (Signed)
Plan of care signed and faxed to [facility] at [phone]. Form sent for scanning.  

## 2018-04-09 ENCOUNTER — Other Ambulatory Visit: Payer: Self-pay | Admitting: Pulmonary Disease

## 2018-04-09 ENCOUNTER — Telehealth: Payer: Self-pay | Admitting: Internal Medicine

## 2018-04-09 DIAGNOSIS — G4730 Sleep apnea, unspecified: Secondary | ICD-10-CM

## 2018-04-09 NOTE — Telephone Encounter (Signed)
LMOM TCB x1 for Corene Cornea w/ [facility]  See BiPAP titration results: Notes recorded by Lauraine Rinne, NP on 04/06/2018 at 2:24 PM EDT Please place an order with DME company to ensure patient is on the settings.  Also please place order for reevaluation sleep center 4 to 6 weeks from now under current recommendations. Please contact the patient to let them know the results of their sleep study.    See results below:    - Recommend a trial of Auto-BiPAP 12-20 cm H2O, EPAP 8-16cm, PS 4cm.  - Avoid alcohol, sedatives and other CNS depressants that may worsen sleep apnea and disrupt normal sleep architecture.   - Sleep hygiene should be reviewed to assess factors that may improve sleep quality.   - Weight management and regular exercise should be initiated or continued.   - Return to Sleep Center for re-evaluation after 4 weeks of therapy. Reassess need for oxygen

## 2018-04-09 NOTE — Telephone Encounter (Signed)
Carlisle Beers, RN routed conversation to [employer] 2 minutes ago (10:59 AM)    Carlisle Beers, RN 6 minutes ago (10:55 AM)      Wife called back. Message form Dr Larose Kells given to wife:   "Recommend patient and family to contact the [place], they are following him for this problem. ER if symptoms severe or if he is at risk of hurting himself or others"  Wife stated that she took pt to the [place] and she stated that they did nothing for his symptoms. Wife is requesting a referral to see a psychologist for pt to see in [place]. Call back number is [phone].      Documentation

## 2018-04-09 NOTE — Telephone Encounter (Signed)
Author phoned wife to relay that we cannot refer to psychiatry, but to [facility] with [facility] to [facility]. Arlene says she is "[facility] up with the [facility]", but he has seen psychiatry in [place] in the past, "but needs more treatment".  locations that offer psychiatry provided to pt's wife, and wife appreciative. Author asked wife to reach out if she needed anything else from our office, wife verbalized understanding.

## 2018-04-09 NOTE — Telephone Encounter (Signed)
Raquel Sarna- Pt's wife requesting referral to psychiatry- we are unable to refer to psych.

## 2018-04-09 NOTE — Telephone Encounter (Signed)
Duplicate message. 

## 2018-04-09 NOTE — Progress Notes (Signed)
Was able to talk to patients wife regarding patient's results.  They verbalized an understanding of what was discussed.Wife states they have appointment conflicts and so we rescheduled for Tuesday Oct 2. At 1030 and order for ONO was placed. No further questions at this time.

## 2018-04-09 NOTE — Telephone Encounter (Signed)
Wife called back. Message form Dr Larose Kells given to wife:   "Recommend patient and family to contact the [place], they are following him for this problem. ER if symptoms severe or if he is at risk of hurting himself or others"  Wife stated that she took pt to the [place] and she stated that they did nothing for his symptoms. Wife is requesting a referral to see a psychologist for pt to see in [place]. Call back number is [phone].

## 2018-04-10 DIAGNOSIS — I872 Venous insufficiency (chronic) (peripheral): Secondary | ICD-10-CM | POA: Diagnosis not present

## 2018-04-10 DIAGNOSIS — R296 Repeated falls: Secondary | ICD-10-CM | POA: Diagnosis not present

## 2018-04-10 DIAGNOSIS — A419 Sepsis, unspecified organism: Secondary | ICD-10-CM | POA: Diagnosis not present

## 2018-04-10 DIAGNOSIS — I10 Essential (primary) hypertension: Secondary | ICD-10-CM | POA: Diagnosis not present

## 2018-04-10 DIAGNOSIS — L03116 Cellulitis of left lower limb: Secondary | ICD-10-CM | POA: Diagnosis not present

## 2018-04-10 DIAGNOSIS — M069 Rheumatoid arthritis, unspecified: Secondary | ICD-10-CM | POA: Diagnosis not present

## 2018-04-10 NOTE — Telephone Encounter (Signed)
LMTCB

## 2018-04-13 NOTE — Telephone Encounter (Signed)
LMOM TCB x2 for [employer]

## 2018-04-14 NOTE — Telephone Encounter (Signed)
lmom X1  With [facility]

## 2018-04-15 NOTE — Telephone Encounter (Signed)
Barbaraann Rondo from [facility] has called and advised that they patient has called and ask them to pick up the equipment that he is going to get his equipment from the [place].  Routing to [place] as a [employer]

## 2018-04-15 NOTE — Telephone Encounter (Signed)
We have attempted to contact [facility] several times with no success or call back. Per triage protocol, message will be closed.

## 2018-04-15 NOTE — Telephone Encounter (Signed)
Ok thank you.   Wyn Quaker FNP

## 2018-04-20 ENCOUNTER — Ambulatory Visit: Payer: Medicare Other | Admitting: Internal Medicine

## 2018-04-21 ENCOUNTER — Ambulatory Visit: Payer: Medicare Other | Admitting: Pulmonary Disease

## 2018-04-22 ENCOUNTER — Ambulatory Visit: Payer: Medicare Other | Admitting: Pulmonary Disease

## 2018-04-22 ENCOUNTER — Encounter (HOSPITAL_BASED_OUTPATIENT_CLINIC_OR_DEPARTMENT_OTHER): Payer: Medicare Other

## 2018-05-05 ENCOUNTER — Telehealth: Payer: Self-pay | Admitting: *Deleted

## 2018-05-05 IMAGING — CR DG HIP (WITH OR WITHOUT PELVIS) 2-3V*L*
3 series · 3 of 3 positions shown · non-contrast
Comparison: None.

CLINICAL DATA: Left groin pain for 3 weeks.  No known injury.

EXAM:
DG HIP (WITH OR WITHOUT PELVIS) 2-3V LEFT

[t pelvis a.p.]
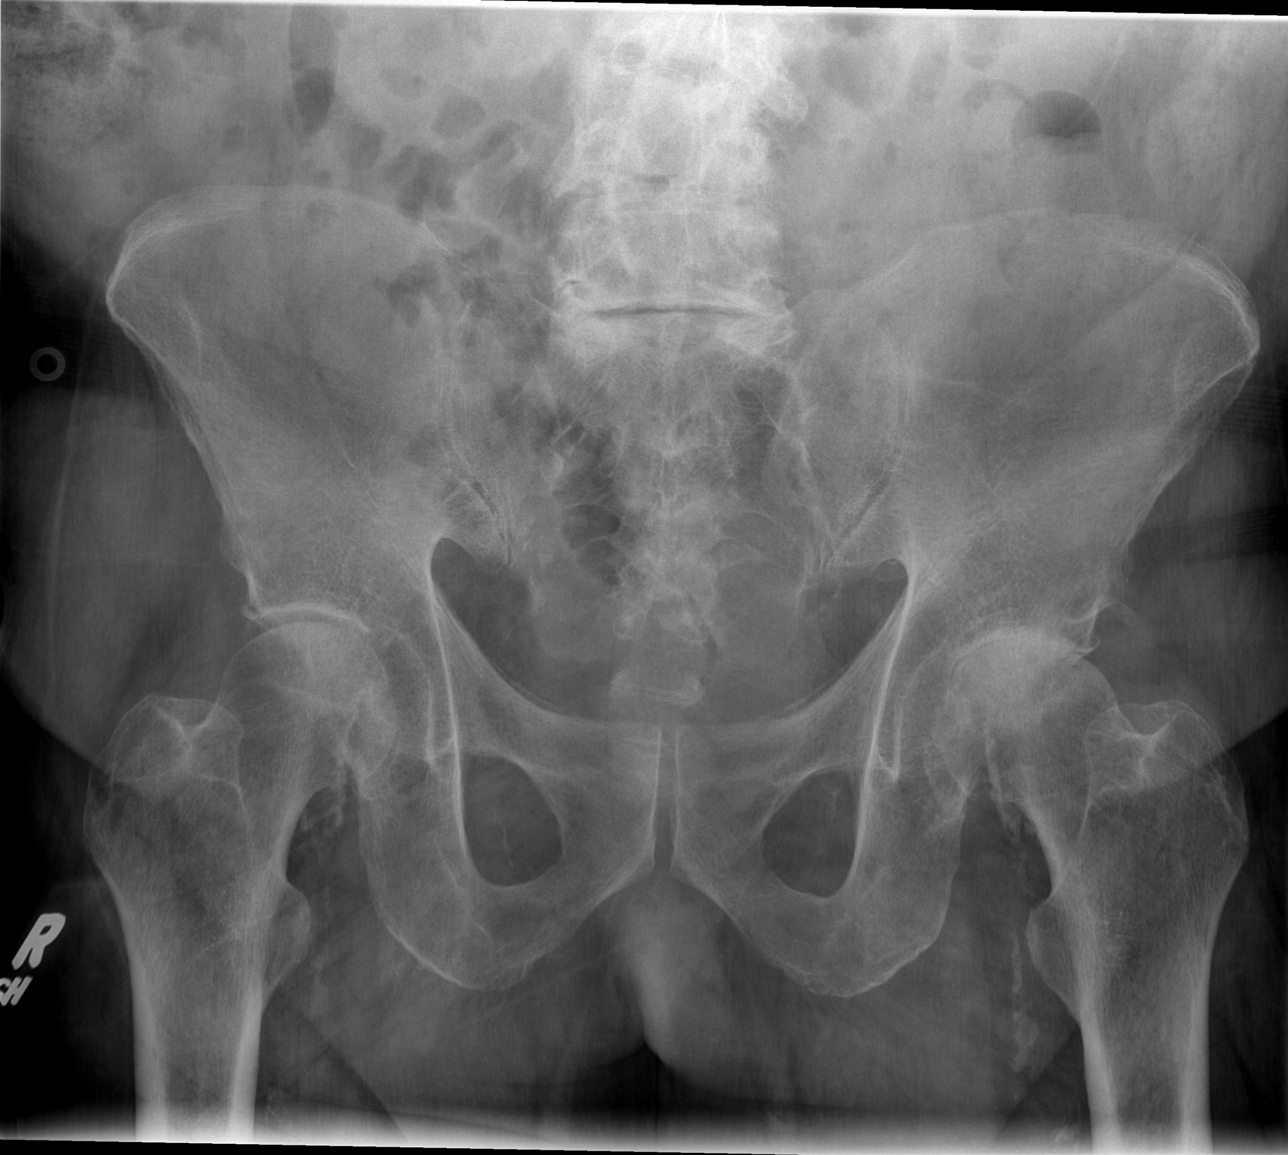

[t hip ap left]
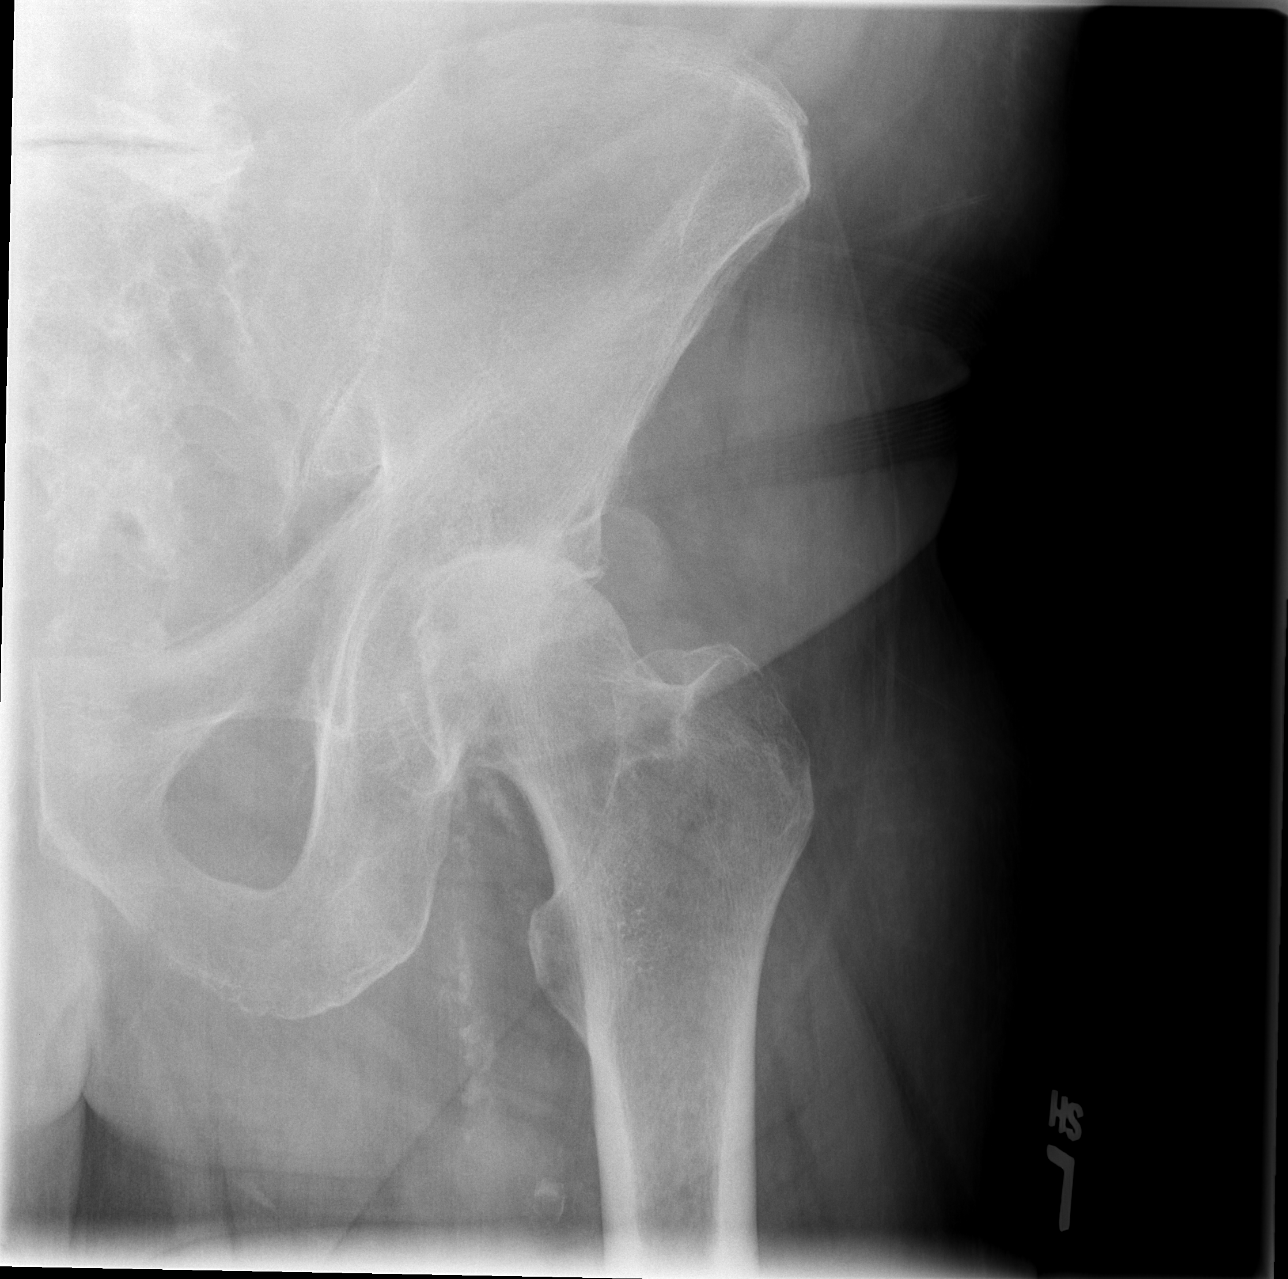

[t hip frog leg left]
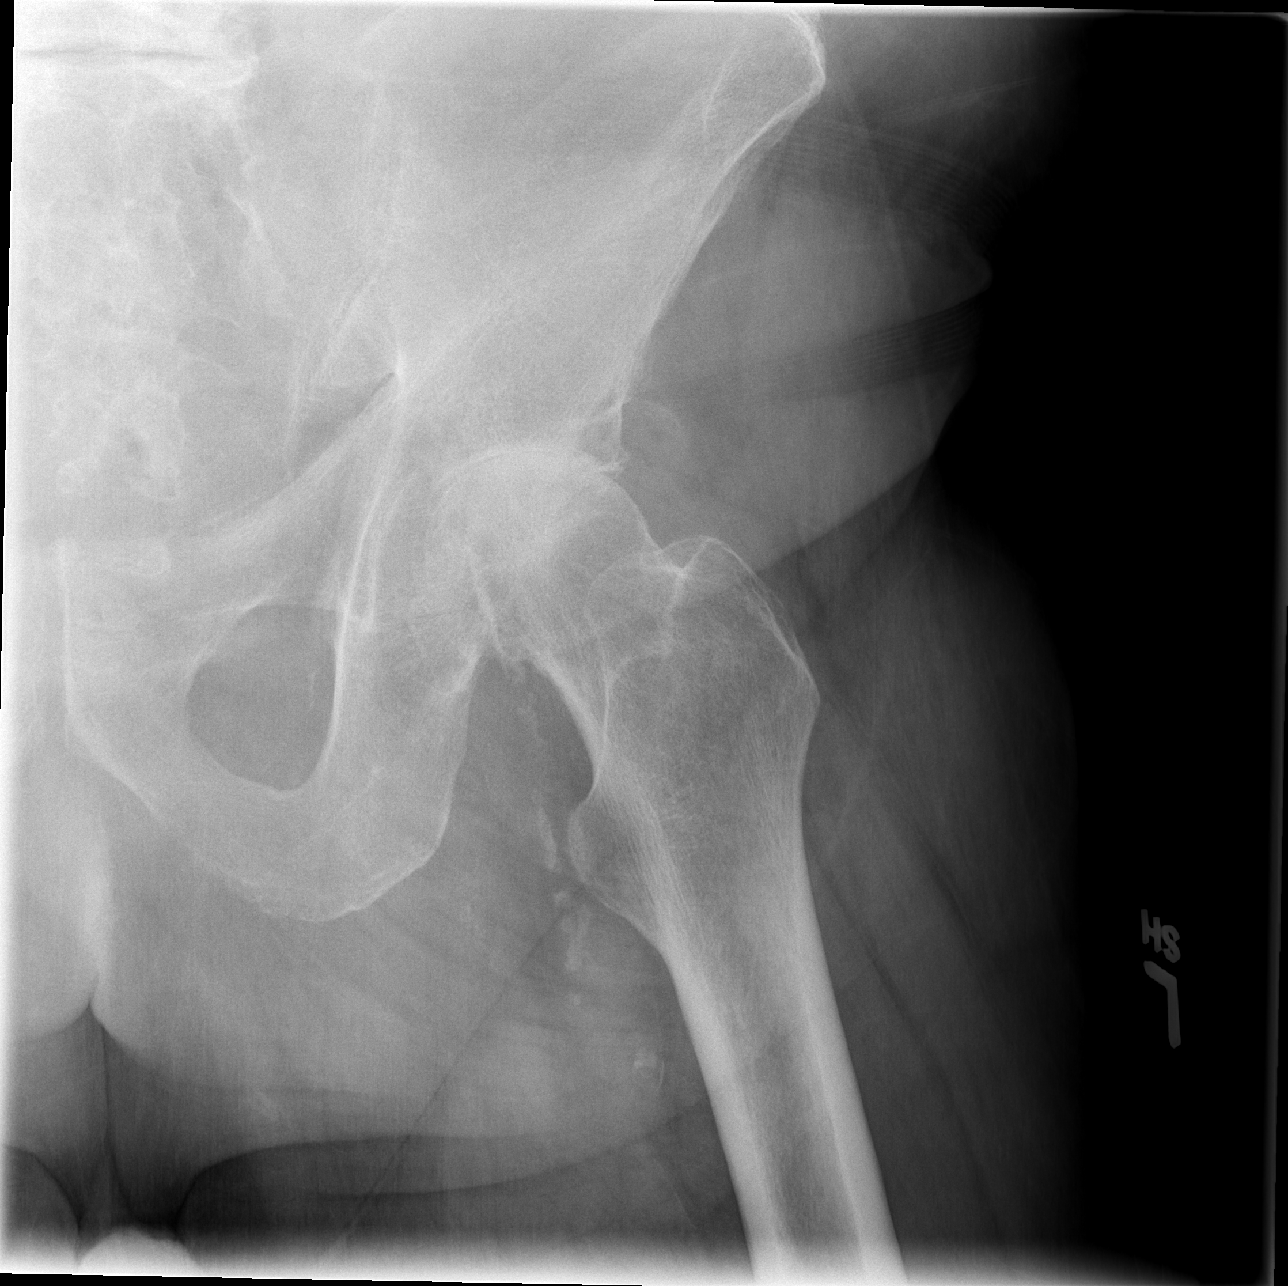

[3 of 3 positions shown; findings below may reference images not displayed]

FINDINGS: Advanced degenerative changes in the left hip with near complete
joint space loss and subchondral sclerosis. Mild spurring. Mild
degenerative changes in the right hip. SI joints are symmetric and
unremarkable. No acute bony abnormality. Specifically, no fracture,
subluxation, or dislocation. Soft tissues are intact.
IMPRESSION: Advanced degenerative changes on the left. Mild degenerative changes
on the right. No acute findings.

## 2018-05-05 NOTE — Telephone Encounter (Signed)
Received Physician Orders from [facility]; forwarded to provider/SLS 10/15  

## 2018-05-05 NOTE — Telephone Encounter (Signed)
Orders signed and faxed to [facility] at [phone]. Orders sent for scanning.

## 2018-05-18 ENCOUNTER — Encounter: Payer: Self-pay | Admitting: Internal Medicine

## 2018-05-18 ENCOUNTER — Ambulatory Visit (INDEPENDENT_AMBULATORY_CARE_PROVIDER_SITE_OTHER): Payer: Medicare Other | Admitting: Internal Medicine

## 2018-05-18 VITALS — BP 126/78 | HR 54 | Temp 97.7°F | Resp 16 | Ht 61.0 in | Wt 210.0 lb

## 2018-05-18 DIAGNOSIS — F329 Major depressive disorder, single episode, unspecified: Secondary | ICD-10-CM

## 2018-05-18 DIAGNOSIS — I10 Essential (primary) hypertension: Secondary | ICD-10-CM

## 2018-05-18 DIAGNOSIS — Z79899 Other long term (current) drug therapy: Secondary | ICD-10-CM | POA: Diagnosis not present

## 2018-05-18 DIAGNOSIS — M0579 Rheumatoid arthritis with rheumatoid factor of multiple sites without organ or systems involvement: Secondary | ICD-10-CM | POA: Diagnosis not present

## 2018-05-18 DIAGNOSIS — F32A Depression, unspecified: Secondary | ICD-10-CM

## 2018-05-18 DIAGNOSIS — M1990 Unspecified osteoarthritis, unspecified site: Secondary | ICD-10-CM

## 2018-05-18 NOTE — Progress Notes (Signed)
Pre visit review using our clinic review tool, if applicable. No additional management support is needed unless otherwise documented below in the visit note. 

## 2018-05-18 NOTE — Patient Instructions (Signed)
GO TO THE LAB     GO TO THE FRONT DESK Schedule your next appointment for a  Check up in 4 months

## 2018-05-18 NOTE — Assessment & Plan Note (Signed)
Left leg wound, sacral wound: Still has a small area at the left leg, is self taking care of it.  They report that the sacral wound is resolved. The patient decided not to follow-up with the word cancer care, he also cancelled  PT, OT and stopped oxygen..  See phone note from 04/21/2018 HTN: On no medications at this point. Hyperlipidemia: On no meds, consider labs on RTC. Rheumatoid arthritis: Not taking Morrie Sheldon, "did not work".  Currently on prednisone, reports that he will see rheumatology on as-needed basis DJD: Controlled on prn tramadol, drug screen and contract today, refill tramadol as needed Schizophrenia, bipolar, depression: Back in September he was admitted to the [place] due to mental issues, currently doing well. Bladder cancer: To see Dr. Diona Fanti next week per patient. Preventive care: Got a flu shot last week at the [place] Compliance: Poor compliance with advice, decided not to continue seeing the wound care center on regular basis, no PT, OT or oxygen supplementation. RTC 4 months

## 2018-05-18 NOTE — Progress Notes (Signed)
Subjective:    Patient ID: Shaun Yoder, male    DOB: 09-May-1946, 72 y.o.   MRN: [id number]  DOS:  05/18/2018 Type of visit - description : f/u Interval history: Here with his wife, they both report that he is doing well. Chart is reviewed.   Review of Systems Denies fever chills No chest pain or difficulty breathing The only wound that he has at this point is a small one on the left leg, is self treating it with local care.   Past Medical History:  Diagnosis Date  . Anemia   . Cancer ([place])    bladder  . Chronic venous insufficiency   . Colitis   . Colon polyps   . Depression   . Diverticulosis   . DVT of deep femoral vein ([place])   . Gastritis   . GERD (gastroesophageal reflux disease)   . History of rectal polyps   . Hyperlipidemia   . Hypertension   . Iron deficiency   . Low back pain   . Lymphedema of both lower extremities   . OA (osteoarthritis)   . Osteonecrosis of shoulder region ([place])   . Schizoaffective disorder, bipolar type ([place])   . Sleep apnea   . Varicose vein     Past Surgical History:  Procedure Laterality Date  . BLADDER SURGERY    . TOTAL KNEE ARTHROPLASTY Bilateral   . VARICOSE VEIN SURGERY      Social History   Socioeconomic History  . Marital status: Married    Spouse name: Not on file  . Number of children: 2  . Years of education: Not on file  . Highest education level: Not on file  Occupational History  . Occupation: retired  Scientific laboratory technician  . Financial resource strain: Not on file  . Food insecurity:    Worry: Not on file    Inability: Not on file  . Transportation needs:    Medical: Not on file    Non-medical: Not on file  Tobacco Use  . Smoking status: Former Smoker    Packs/day: 2.50    Years: 43.00    Pack years: 107.50    Types: Cigarettes    Last attempt to quit: 07/22/1998    Years since quitting: 19.8  . Smokeless tobacco: Never Used  Substance and Sexual Activity  . Alcohol use: Yes    Alcohol/week:  0.0 standard drinks    Comment: Occasional glass of wine  . Drug use: No  . Sexual activity: Not Currently  Lifestyle  . Physical activity:    Days per week: Not on file    Minutes per session: Not on file  . Stress: Not on file  Relationships  . Social connections:    Talks on phone: Not on file    Gets together: Not on file    Attends religious service: Not on file    Active member of club or organization: Not on file    Attends meetings of clubs or organizations: Not on file    Relationship status: Not on file  . Intimate partner violence:    Fear of current or ex partner: Not on file    Emotionally abused: Not on file    Physically abused: Not on file    Forced sexual activity: Not on file  Other Topics Concern  . Not on file  Social History Narrative   Originally from [place]. Moved to [place] in July 22, 1985. 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 No known asbestos exposure. 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 Previously worked in receiving at [employer]. 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 No international travel. Has a dog currently. Remote exposure to Cockatiel in a different home. No mold exposure. No hot tub exposure.       Two adopted children      Allergies as of 05/18/2018      Reactions   Leflunomide Other (See Comments)   diarrhea   Ancef [cefazolin]    Rash   Morphine And Related Nausea And Vomiting   Plaquenil [hydroxychloroquine Sulfate]    rash      Medication List        Accurate as of 05/18/18  8:39 PM. Always use your most recent med list.          ARIPiprazole 30 MG tablet Commonly known as:  ABILIFY Take 30 mg by mouth at bedtime.   aspirin EC 81 MG tablet Take 81 mg by mouth daily.   augmented betamethasone dipropionate 0.05 % cream Commonly known as:  DIPROLENE-AF Apply topically 2 (two) times daily. Apply to the rash of the right side of the face     CALCIUM 500/D 500-200 MG-UNIT tablet Generic drug:  calcium-vitamin D Take 1 tablet by mouth daily with breakfast.   divalproex 250 MG DR tablet Commonly known as:  DEPAKOTE Take 250 mg by mouth daily with lunch.   divalproex 500 MG 24 hr tablet Commonly known as:  DEPAKOTE ER Take 1,500 mg by mouth daily. 3 tabs at bedtime   ELIQUIS 5 MG Tabs tablet Generic drug:  apixaban Take 2.5 mg by mouth 2 (two) times daily.   folic acid 1 MG tablet Commonly known as:  FOLVITE Take 1 mg by mouth daily.   ketoconazole 2 % shampoo Commonly known as:  NIZORAL LATHER AND MASSAGE INTO SCALP 2-3 TIMES PER WEEK. LEAVE FOR 5 MINUTES BEFORE RINSING.   Melatonin 3 MG Tabs Take 3 mg by mouth at bedtime.   mirabegron ER 50 MG Tb24 tablet Commonly known as:  MYRBETRIQ Take 50 mg by mouth daily.   Omega-3 1000 MG Caps Take 1 capsule by mouth daily.   omeprazole 20 MG capsule Commonly known as:  PRILOSEC Take 20 mg by mouth every morning.   oxybutynin 5 MG tablet Commonly known as:  DITROPAN Take 5 mg by mouth 2 (two) times daily.   OXYGEN Inhale into the lungs. To use if O2 drops below 85 %   predniSONE 5 MG tablet Commonly known as:  DELTASONE Take 5 mg by mouth daily with breakfast.   traMADol 50 MG tablet Commonly known as:  ULTRAM Take 1 tablet (50 mg total) by mouth every 6 (six) hours as needed.   vitamin C 500 MG tablet Commonly known as:  ASCORBIC ACID Take 500 mg by mouth daily.   XELJANZ XR 11 MG Tb24 Generic drug:  Tofacitinib Citrate Take 1 tablet by mouth daily.   ziprasidone 80 MG capsule Commonly known as:  GEODON Take 80 mg by mouth daily.   ziprasidone 20 MG capsule Commonly known as:  GEODON Take 20 mg by mouth daily at 12 noon.          Objective:   Physical Exam BP 126/78 (BP Location: Left Arm, Patient Position: Sitting, Cuff Size: Normal)   Pulse (!) 54   Temp 97.7 F (36.5 C) (Oral)   Resp  16   Ht 5\' 1"  (1.549 m)   Wt 210 lb (95.3 kg)  Comment: Per Pt  SpO2 93%   BMI 39.68 kg/m   General:   Well developed, NAD, see BMI.  HEENT:  Normocephalic . Face symmetric, atraumatic Lungs:  CTA B Normal respiratory effort, no intercostal retractions, no accessory muscle use. Heart: RRR, + syst  murmur.  Skin: Lower extremities are covered. Neurologic:  alert & oriented X3.  Speech normal, gait not tested.  Sits in a wheelchair. Psych--  Cognition and judgment appear intact.  Cooperative with normal attention span and concentration.  Behavior appropriate. No anxious or depressed appearing.      Assessment & Plan:    Assessment   (new pt, transfer from [facility],02-2015) Hypertension Hyperlipidemia Ao stenosis, severe: Dr Burt Knack MSK: --Seronegative Rheumatoid arthritis-- Dr Lenna Gilford  --DJD-- DR Tamera Punt , dr Ronnie Derby --low back pain d/t DJD, Dr Sherlyn Lick  PSYCH: Schizophrenia, bipolar, depression: Follow-up at the [place] in [facility] GU: Bladder cancer-transitional cell, Dr. Shona Needles GI:   GERD, history of colitis, history of gastritis,  colon polyps- had a virtual cscope 2016, + polyps, declined further eval Pulmomany: Dr Ashok Cordia --Sleep apnea:  Cpap intolerant --CT nodules f/u 2 pulmonary  --hypoxemia, dx 01-2017, unable to get O2 from medicare, eventually got O2 home-portable ~08-2017 Hematology: L LEG DVT 02-2016 + Lupus anticoagulant Varicose veins: s/p remote surgery, has LE wounds  [facility]  [facility], Ortho, pain mngmt   PLAN  Left leg wound, sacral wound: Still has a small area at the left leg, is self taking care of it.  They report that the sacral wound is resolved. The patient decided not to follow-up with the word cancer care, he also cancelled  PT, OT and stopped oxygen..  See phone note from 04/21/2018 HTN: On no medications at this point. Hyperlipidemia: On no meds, consider labs on RTC. Rheumatoid arthritis: Not taking Morrie Sheldon, "did not work".  Currently on  prednisone, reports that he will see rheumatology on as-needed basis DJD: Controlled on prn tramadol, drug screen and contract today, refill tramadol as needed Schizophrenia, bipolar, depression: Back in September he was admitted to the [place] due to mental issues, currently doing well. Bladder cancer: To see Dr. Diona Fanti next week per patient. Preventive care: Got a flu shot last week at the [place] Compliance: Poor compliance with advice, decided not to continue seeing the wound care center on regular basis, no PT, OT or oxygen supplementation. RTC 4 months

## 2018-05-22 LAB — DRUG TOX MONITOR 1 W/CONF, ORAL FLD
Amphetamines: NEGATIVE ng/mL (ref <10)
Barbiturates: NEGATIVE ng/mL (ref <10)
Benzodiazepines: NEGATIVE ng/mL (ref <0.50)
Buprenorphine: NEGATIVE ng/mL (ref <0.10)
Cocaine: NEGATIVE ng/mL (ref <5.0)
Fentanyl: NEGATIVE ng/mL (ref <0.10)
Heroin Metabolite: NEGATIVE ng/mL (ref <1.0)
MARIJUANA: NEGATIVE ng/mL (ref <2.5)
MDMA: NEGATIVE ng/mL (ref <10)
Meprobamate: NEGATIVE ng/mL (ref <2.5)
Methadone: NEGATIVE ng/mL (ref <5.0)
Nicotine Metabolite: NEGATIVE ng/mL (ref <5.0)
Opiates: NEGATIVE ng/mL (ref <2.5)
Phencyclidine: NEGATIVE ng/mL (ref <10)
Tapentadol: NEGATIVE ng/mL (ref <5.0)
Tramadol: 406.8 ng/mL — ABNORMAL HIGH (ref <5.0)
Tramadol: POSITIVE ng/mL — AB (ref <5.0)
Zolpidem: NEGATIVE ng/mL (ref <5.0)

## 2018-05-23 ENCOUNTER — Other Ambulatory Visit: Payer: Self-pay | Admitting: Family Medicine

## 2018-05-23 DIAGNOSIS — N481 Balanitis: Secondary | ICD-10-CM

## 2018-05-27 DIAGNOSIS — C678 Malignant neoplasm of overlapping sites of bladder: Secondary | ICD-10-CM | POA: Diagnosis not present

## 2018-06-08 ENCOUNTER — Telehealth: Payer: Self-pay | Admitting: Internal Medicine

## 2018-06-08 NOTE — Telephone Encounter (Signed)
Spoke w/ Janae Bridgeman, Pt's wife, informed of recommendations. They will try tramadol and tylenol.

## 2018-06-08 NOTE — Telephone Encounter (Signed)
Copied from [place] [id number]. Topic: Quick Communication - See Telephone Encounter >> Jun 08, 2018  9:05 AM Robina Ade, Helene Kelp D wrote: CRM for notification. See Telephone encounter for: 06/08/18. Pt wife called and said that his tramadol is not working and wants to know if Dr. Larose Kells can call him in something else for pain. Please call pt back, thanks.

## 2018-06-08 NOTE — Telephone Encounter (Signed)
Please advise 

## 2018-06-08 NOTE — Telephone Encounter (Signed)
Unfortunately, options are limited because he is allergic to morphine and related drugs such as oxycodone or hydrocodone. In addition to tramadol , rec Tylenol 500 mg 2 tablets every 8 hours He might benefit from see pain management again.  Would he like a referral?

## 2018-06-15 ENCOUNTER — Telehealth: Payer: Self-pay | Admitting: Internal Medicine

## 2018-06-15 NOTE — Telephone Encounter (Signed)
Copied from [place] [id number]. Topic: Quick Communication - Rx Refill/Question >> Jun 15, 2018 11:18 AM Leward Quan A wrote: Medication: traMADol (ULTRAM) 50 MG tablet,  oxyCODONE-acetaminophen (PERCOCET/ROXICET) 5-325 MG per tablet 1 tablet  Has the patient contacted their pharmacy? Yes.   (Agent: If no, request that the patient contact the pharmacy for the refill.) (Agent: If yes, when and what did the pharmacy advise?)  Preferred Pharmacy (with phone number or street name): [id number] - [place], [place] [address] [phone] (Phone) [phone] (Fax)    Agent: Please be advised that RX refills may take up to 3 business days. We ask that you follow-up with your pharmacy.

## 2018-06-15 NOTE — Telephone Encounter (Signed)
Rx refill request and new Rx request:  Tramadol 50 mg  - refill request- pended Rx                                                              Oxycodone- acetaminophen 5-325 mg - new Rx  LOV: 05/18/18  PCP: [place]: verified

## 2018-06-17 DIAGNOSIS — B351 Tinea unguium: Secondary | ICD-10-CM | POA: Diagnosis not present

## 2018-06-17 DIAGNOSIS — M79672 Pain in left foot: Secondary | ICD-10-CM | POA: Diagnosis not present

## 2018-06-17 DIAGNOSIS — M79671 Pain in right foot: Secondary | ICD-10-CM | POA: Diagnosis not present

## 2018-06-17 MED ORDER — TRAMADOL HCL 50 MG PO TABS: 50.0000 mg | ORAL_TABLET | Freq: Four times a day (QID) | ORAL | 0 refills | Status: DC | PRN

## 2018-06-17 NOTE — Telephone Encounter (Signed)
Pt's wife calling to check on the status of this.  States that pt has no medication left.

## 2018-06-17 NOTE — Telephone Encounter (Signed)
sent 

## 2018-06-17 NOTE — Telephone Encounter (Signed)
Requested medication (s) are due for refill today -yes-as needed  Requested medication (s) are on the active medication list -yes  Future visit scheduled -yes  Last refill: 11/17/17  Notes to clinic: patient is requesting non-delegated Rx: Ultram. Also requesting new pain medication. (Sorry mistakenly mis routed)  Requested Prescriptions  Pending Prescriptions Disp Refills   traMADol (ULTRAM) 50 MG tablet 30 tablet 0    Sig: Take 1 tablet (50 mg total) by mouth every 6 (six) hours as needed.     Not Delegated - Analgesics:  Opioid Agonists Failed - 06/16/2018 11:22 AM      Failed - This refill cannot be delegated      Failed - Urine Drug Screen completed in last 360 days.      Passed - Valid encounter within last 6 months    Recent Outpatient Visits          1 month ago Osteoarthritis, unspecified osteoarthritis type, unspecified site   [facility] [place], MD   3 months ago Wound of sacral region, initial encounter   [facility] [place], MD   3 months ago Burn, Systems analyst at [place], MD   4 months ago Bacteremia due to methicillin susceptible Staphylococcus aureus (MSSA)   [facility] [place], MD   4 months ago Drug rash   [facility] at [place], NP      Future Appointments            In 5 months Colon Branch, MD [place] at [facility], [facility]            Requested Prescriptions  Pending Prescriptions Disp Refills   traMADol (ULTRAM) 50 MG tablet 30 tablet 0    Sig: Take 1 tablet (50 mg total) by mouth every 6 (six) hours as needed.     Not Delegated - Analgesics:  Opioid Agonists Failed - 06/16/2018 11:22 AM      Failed - This refill cannot be delegated      Failed - Urine Drug Screen completed  in last 360 days.      Passed - Valid encounter within last 6 months    Recent Outpatient Visits          1 month ago Osteoarthritis, unspecified osteoarthritis type, unspecified site   [facility] [place], MD   3 months ago Wound of sacral region, initial Loss adjuster, chartered at [place], MD   3 months ago Burn, Systems analyst at [place], MD   4 months ago Bacteremia due to methicillin susceptible Staphylococcus aureus (MSSA)   [facility] at [place], MD   4 months ago Drug rash   [facility] at [place], NP      Future Appointments            In 5 months Larose Kells, Alda Berthold, MD [facility] at [place]

## 2018-06-17 NOTE — Telephone Encounter (Signed)
Pt is requesting refill on tramadol.   Last OV: 05/18/2018 Last Fill: 11/17/2017 #30 and 0RF UDS: 05/18/2018 Low risk  NCCR in media from 05/18/2018

## 2018-07-02 DIAGNOSIS — L89022 Pressure ulcer of left elbow, stage 2: Secondary | ICD-10-CM | POA: Diagnosis not present

## 2018-07-02 DIAGNOSIS — L98492 Non-pressure chronic ulcer of skin of other sites with fat layer exposed: Secondary | ICD-10-CM | POA: Diagnosis not present

## 2018-07-02 DIAGNOSIS — L97222 Non-pressure chronic ulcer of left calf with fat layer exposed: Secondary | ICD-10-CM | POA: Diagnosis not present

## 2018-07-02 DIAGNOSIS — L89312 Pressure ulcer of right buttock, stage 2: Secondary | ICD-10-CM | POA: Diagnosis not present

## 2018-07-02 DIAGNOSIS — L97821 Non-pressure chronic ulcer of other part of left lower leg limited to breakdown of skin: Secondary | ICD-10-CM | POA: Diagnosis not present

## 2018-07-02 DIAGNOSIS — L97812 Non-pressure chronic ulcer of other part of right lower leg with fat layer exposed: Secondary | ICD-10-CM | POA: Diagnosis not present

## 2018-07-02 DIAGNOSIS — I872 Venous insufficiency (chronic) (peripheral): Secondary | ICD-10-CM | POA: Diagnosis not present

## 2018-07-02 DIAGNOSIS — I890 Lymphedema, not elsewhere classified: Secondary | ICD-10-CM | POA: Diagnosis not present

## 2018-07-08 DIAGNOSIS — L89020 Pressure ulcer of left elbow, unstageable: Secondary | ICD-10-CM | POA: Diagnosis not present

## 2018-07-08 DIAGNOSIS — I872 Venous insufficiency (chronic) (peripheral): Secondary | ICD-10-CM | POA: Diagnosis not present

## 2018-07-08 DIAGNOSIS — Z86718 Personal history of other venous thrombosis and embolism: Secondary | ICD-10-CM | POA: Diagnosis not present

## 2018-07-08 DIAGNOSIS — I890 Lymphedema, not elsewhere classified: Secondary | ICD-10-CM | POA: Diagnosis not present

## 2018-07-08 DIAGNOSIS — L97812 Non-pressure chronic ulcer of other part of right lower leg with fat layer exposed: Secondary | ICD-10-CM | POA: Diagnosis not present

## 2018-07-08 DIAGNOSIS — Z7901 Long term (current) use of anticoagulants: Secondary | ICD-10-CM | POA: Diagnosis not present

## 2018-07-08 DIAGNOSIS — L89312 Pressure ulcer of right buttock, stage 2: Secondary | ICD-10-CM | POA: Diagnosis not present

## 2018-07-08 DIAGNOSIS — L97822 Non-pressure chronic ulcer of other part of left lower leg with fat layer exposed: Secondary | ICD-10-CM | POA: Diagnosis not present

## 2018-07-08 DIAGNOSIS — M069 Rheumatoid arthritis, unspecified: Secondary | ICD-10-CM | POA: Diagnosis not present

## 2018-07-08 DIAGNOSIS — F259 Schizoaffective disorder, unspecified: Secondary | ICD-10-CM | POA: Diagnosis not present

## 2018-07-09 DIAGNOSIS — L97222 Non-pressure chronic ulcer of left calf with fat layer exposed: Secondary | ICD-10-CM | POA: Diagnosis not present

## 2018-07-09 DIAGNOSIS — I872 Venous insufficiency (chronic) (peripheral): Secondary | ICD-10-CM | POA: Diagnosis not present

## 2018-07-09 DIAGNOSIS — L98492 Non-pressure chronic ulcer of skin of other sites with fat layer exposed: Secondary | ICD-10-CM | POA: Diagnosis not present

## 2018-07-09 DIAGNOSIS — L97821 Non-pressure chronic ulcer of other part of left lower leg limited to breakdown of skin: Secondary | ICD-10-CM | POA: Diagnosis not present

## 2018-07-09 DIAGNOSIS — L89312 Pressure ulcer of right buttock, stage 2: Secondary | ICD-10-CM | POA: Diagnosis not present

## 2018-07-09 DIAGNOSIS — I890 Lymphedema, not elsewhere classified: Secondary | ICD-10-CM | POA: Diagnosis not present

## 2018-07-13 ENCOUNTER — Other Ambulatory Visit: Payer: Self-pay | Admitting: Internal Medicine

## 2018-07-13 NOTE — Telephone Encounter (Signed)
Sent!

## 2018-07-13 NOTE — Telephone Encounter (Addendum)
Pt is requesting refill on tramadol.   Last OV: 05/18/2018 Last Fill: 06/17/2018 #30 and 0RF UDS: 05/18/2018 Low risk  NCCR in media from 05/18/2018

## 2018-07-14 DIAGNOSIS — I890 Lymphedema, not elsewhere classified: Secondary | ICD-10-CM | POA: Diagnosis not present

## 2018-07-14 DIAGNOSIS — L97812 Non-pressure chronic ulcer of other part of right lower leg with fat layer exposed: Secondary | ICD-10-CM | POA: Diagnosis not present

## 2018-07-14 DIAGNOSIS — I872 Venous insufficiency (chronic) (peripheral): Secondary | ICD-10-CM | POA: Diagnosis not present

## 2018-07-14 DIAGNOSIS — L89020 Pressure ulcer of left elbow, unstageable: Secondary | ICD-10-CM | POA: Diagnosis not present

## 2018-07-14 DIAGNOSIS — L89312 Pressure ulcer of right buttock, stage 2: Secondary | ICD-10-CM | POA: Diagnosis not present

## 2018-07-14 DIAGNOSIS — L97822 Non-pressure chronic ulcer of other part of left lower leg with fat layer exposed: Secondary | ICD-10-CM | POA: Diagnosis not present

## 2018-07-20 DIAGNOSIS — L89020 Pressure ulcer of left elbow, unstageable: Secondary | ICD-10-CM | POA: Diagnosis not present

## 2018-07-20 DIAGNOSIS — I872 Venous insufficiency (chronic) (peripheral): Secondary | ICD-10-CM | POA: Diagnosis not present

## 2018-07-20 DIAGNOSIS — L97812 Non-pressure chronic ulcer of other part of right lower leg with fat layer exposed: Secondary | ICD-10-CM | POA: Diagnosis not present

## 2018-07-20 DIAGNOSIS — I890 Lymphedema, not elsewhere classified: Secondary | ICD-10-CM | POA: Diagnosis not present

## 2018-07-20 DIAGNOSIS — L97822 Non-pressure chronic ulcer of other part of left lower leg with fat layer exposed: Secondary | ICD-10-CM | POA: Diagnosis not present

## 2018-07-20 DIAGNOSIS — L89312 Pressure ulcer of right buttock, stage 2: Secondary | ICD-10-CM | POA: Diagnosis not present

## 2018-07-28 ENCOUNTER — Telehealth: Payer: Self-pay | Admitting: Internal Medicine

## 2018-07-28 DIAGNOSIS — L97222 Non-pressure chronic ulcer of left calf with fat layer exposed: Secondary | ICD-10-CM | POA: Diagnosis not present

## 2018-07-28 DIAGNOSIS — L89020 Pressure ulcer of left elbow, unstageable: Secondary | ICD-10-CM | POA: Diagnosis not present

## 2018-07-28 DIAGNOSIS — L97221 Non-pressure chronic ulcer of left calf limited to breakdown of skin: Secondary | ICD-10-CM | POA: Diagnosis not present

## 2018-07-28 DIAGNOSIS — L97812 Non-pressure chronic ulcer of other part of right lower leg with fat layer exposed: Secondary | ICD-10-CM | POA: Diagnosis not present

## 2018-07-28 DIAGNOSIS — I890 Lymphedema, not elsewhere classified: Secondary | ICD-10-CM | POA: Diagnosis not present

## 2018-07-28 DIAGNOSIS — L97821 Non-pressure chronic ulcer of other part of left lower leg limited to breakdown of skin: Secondary | ICD-10-CM | POA: Diagnosis not present

## 2018-07-28 DIAGNOSIS — I872 Venous insufficiency (chronic) (peripheral): Secondary | ICD-10-CM | POA: Diagnosis not present

## 2018-07-28 DIAGNOSIS — L89312 Pressure ulcer of right buttock, stage 2: Secondary | ICD-10-CM | POA: Diagnosis not present

## 2018-07-28 DIAGNOSIS — L97822 Non-pressure chronic ulcer of other part of left lower leg with fat layer exposed: Secondary | ICD-10-CM | POA: Diagnosis not present

## 2018-07-28 DIAGNOSIS — L98492 Non-pressure chronic ulcer of skin of other sites with fat layer exposed: Secondary | ICD-10-CM | POA: Diagnosis not present

## 2018-07-29 NOTE — Telephone Encounter (Signed)
sent 

## 2018-07-29 NOTE — Telephone Encounter (Signed)
Pt is requesting refill on tramadol.   Last OV: 05/18/2018 Last fill: 07/13/2018 #30 and 0RF (Pt sig: 1 tab q6h prn) UDS: 05/18/2018 Low risk  NCCR in media from 04/2018

## 2018-08-03 DIAGNOSIS — L97822 Non-pressure chronic ulcer of other part of left lower leg with fat layer exposed: Secondary | ICD-10-CM | POA: Diagnosis not present

## 2018-08-03 DIAGNOSIS — L97812 Non-pressure chronic ulcer of other part of right lower leg with fat layer exposed: Secondary | ICD-10-CM | POA: Diagnosis not present

## 2018-08-03 DIAGNOSIS — I890 Lymphedema, not elsewhere classified: Secondary | ICD-10-CM | POA: Diagnosis not present

## 2018-08-03 DIAGNOSIS — L89312 Pressure ulcer of right buttock, stage 2: Secondary | ICD-10-CM | POA: Diagnosis not present

## 2018-08-03 DIAGNOSIS — I872 Venous insufficiency (chronic) (peripheral): Secondary | ICD-10-CM | POA: Diagnosis not present

## 2018-08-03 DIAGNOSIS — L89020 Pressure ulcer of left elbow, unstageable: Secondary | ICD-10-CM | POA: Diagnosis not present

## 2018-08-11 ENCOUNTER — Other Ambulatory Visit: Payer: Self-pay | Admitting: Internal Medicine

## 2018-08-11 DIAGNOSIS — L89312 Pressure ulcer of right buttock, stage 2: Secondary | ICD-10-CM | POA: Diagnosis not present

## 2018-08-11 DIAGNOSIS — I872 Venous insufficiency (chronic) (peripheral): Secondary | ICD-10-CM | POA: Diagnosis not present

## 2018-08-11 DIAGNOSIS — N481 Balanitis: Secondary | ICD-10-CM

## 2018-08-11 DIAGNOSIS — L97222 Non-pressure chronic ulcer of left calf with fat layer exposed: Secondary | ICD-10-CM | POA: Diagnosis not present

## 2018-08-11 DIAGNOSIS — I890 Lymphedema, not elsewhere classified: Secondary | ICD-10-CM | POA: Diagnosis not present

## 2018-08-11 DIAGNOSIS — L98492 Non-pressure chronic ulcer of skin of other sites with fat layer exposed: Secondary | ICD-10-CM | POA: Diagnosis not present

## 2018-08-11 DIAGNOSIS — L97821 Non-pressure chronic ulcer of other part of left lower leg limited to breakdown of skin: Secondary | ICD-10-CM | POA: Diagnosis not present

## 2018-08-12 DIAGNOSIS — M12812 Other specific arthropathies, not elsewhere classified, left shoulder: Secondary | ICD-10-CM | POA: Diagnosis not present

## 2018-08-12 DIAGNOSIS — M12811 Other specific arthropathies, not elsewhere classified, right shoulder: Secondary | ICD-10-CM | POA: Diagnosis not present

## 2018-08-24 DIAGNOSIS — B351 Tinea unguium: Secondary | ICD-10-CM | POA: Diagnosis not present

## 2018-08-24 DIAGNOSIS — M79672 Pain in left foot: Secondary | ICD-10-CM | POA: Diagnosis not present

## 2018-08-24 DIAGNOSIS — M79671 Pain in right foot: Secondary | ICD-10-CM | POA: Diagnosis not present

## 2018-08-25 DIAGNOSIS — L98492 Non-pressure chronic ulcer of skin of other sites with fat layer exposed: Secondary | ICD-10-CM | POA: Diagnosis not present

## 2018-08-25 DIAGNOSIS — L97222 Non-pressure chronic ulcer of left calf with fat layer exposed: Secondary | ICD-10-CM | POA: Diagnosis not present

## 2018-08-25 DIAGNOSIS — I872 Venous insufficiency (chronic) (peripheral): Secondary | ICD-10-CM | POA: Diagnosis not present

## 2018-08-25 DIAGNOSIS — I890 Lymphedema, not elsewhere classified: Secondary | ICD-10-CM | POA: Diagnosis not present

## 2018-08-25 DIAGNOSIS — L89312 Pressure ulcer of right buttock, stage 2: Secondary | ICD-10-CM | POA: Diagnosis not present

## 2018-08-25 DIAGNOSIS — L97821 Non-pressure chronic ulcer of other part of left lower leg limited to breakdown of skin: Secondary | ICD-10-CM | POA: Diagnosis not present

## 2018-08-27 NOTE — Progress Notes (Addendum)
Subjective:   Shaun Yoder is a 73 y.o. male who presents for Medicare Annual/Subsequent preventive examination.  Pt here in electric wheelchair. Accompanied by wife.  Review of Systems: No ROS.  Medicare Wellness Visit. Additional risk factors are reflected in the social history. Cardiac Risk Factors include: advanced age (>18men, >19 women);male gender;hypertension;dyslipidemia;obesity (BMI >30kg/m2) Sleep patterns:  Takes melatonin. Sleeps 5-6 hrs. Home Safety/Smoke Alarms: Feels safe in home. Smoke alarms in place.  Lives with wife in 1 story home. Wheelchair accessible. 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 Male:   CCS- last 11/10/14. No recall on file    PSA-  Lab Results  Component Value Date   PSA 0.409 03/27/2017   PSA 0.505 10/19/2015   PSA 1.10 Test Methodology: [employer] PSA 03/17/2009       Objective:    Vitals: BP 132/82 (BP Location: Left Arm, Patient Position: Sitting, Cuff Size: Normal)   Pulse 69   Ht 5\' 1"  (1.549 m)   Wt 210 lb (95.3 kg)   SpO2 93%   BMI 39.68 kg/m   Body mass index is 39.68 kg/m.  Advanced Directives 08/28/2018 03/20/2018 12/26/2017 12/25/2017 12/24/2017 11/04/2017 08/28/2017  Does Patient Have a Medical Advance Directive? Yes Yes Yes No No No Yes  Type of Paramedic of [place];Living will Healthcare Power of [place]  Does patient want to make changes to medical advance directive? No - Patient declined No - Patient declined No - Patient declined - - - No - Patient declined  Copy of [place] in Chart? Yes - validated most recent copy scanned in chart (See row information) Yes - - - - No - copy requested  Would patient like information on creating a medical advance directive? - - - - - - -    Tobacco Social History   Tobacco Use  Smoking Status Former Smoker  . Packs/day: 2.50  . Years: 43.00  . Pack  years: 107.50  . Types: Cigarettes  . Last attempt to quit: 07/22/1998  . Years since quitting: 20.1  Smokeless Tobacco Never Used     Counseling given: Not Answered   Clinical Intake:     Pain : No/denies pain                 Past Medical History:  Diagnosis Date  . Anemia   . Cancer ([place])    bladder  . Chronic venous insufficiency   . Colitis   . Colon polyps   . Depression   . Diverticulosis   . DVT of deep femoral vein ([place])   . Gastritis   . GERD (gastroesophageal reflux disease)   . History of rectal polyps   . Hyperlipidemia   . Hypertension   . Iron deficiency   . Low back pain   . Lymphedema of both lower extremities   . OA (osteoarthritis)   . 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 Schizoaffective disorder, bipolar type ([place])   . Sleep apnea   . Varicose vein    Past Surgical History:  Procedure Laterality Date  . BLADDER SURGERY    . TOTAL KNEE ARTHROPLASTY Bilateral   . VARICOSE VEIN SURGERY     Family History  Problem Relation Age of Onset  . Heart attack Mother   . Heart attack Father   . Stroke Father   . Rheumatologic disease Neg  Hx   . Colon cancer Neg Hx   . Lung disease Neg Hx   . Prostate cancer Neg Hx    Social History   Socioeconomic History  . Marital status: Married    Spouse name: Not on file  . Number of children: 2  . Years of education: Not on file  . Highest education level: Not on file  Occupational History  . Occupation: retired  Scientific laboratory technician  . Financial resource strain: Not on file  . Food insecurity:    Worry: Not on file    Inability: Not on file  . Transportation needs:    Medical: Not on file    Non-medical: Not on file  Tobacco Use  . Smoking status: Former Smoker    Packs/day: 2.50    Years: 43.00    Pack years: 107.50    Types: Cigarettes    Last attempt to quit: 07/22/1998    Years since quitting: 20.1  . Smokeless tobacco: Never Used  Substance and Sexual Activity  . Alcohol use:  Not Currently    Alcohol/week: 0.0 standard drinks  . Drug use: No  . Sexual activity: Not Currently  Lifestyle  . Physical activity:    Days per week: Not on file    Minutes per session: Not on file  . Stress: Not on file  Relationships  . Social connections:    Talks on phone: Not on file    Gets together: Not on file    Attends religious service: Not on file    Active member of club or organization: Not on file    Attends meetings of clubs or organizations: Not on file    Relationship status: Not on file  Other Topics Concern  . Not on file  Social History Narrative   Originally from [place]. Moved to [facility] in July 22, 1985. 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 No known asbestos exposure. 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 No international travel. Has a dog currently. Remote exposure to Cockatiel in a different home. No mold exposure. No hot tub exposure.       Two adopted children    Outpatient Encounter Medications as of 08/28/2018  Medication Sig  . apixaban (ELIQUIS) 5 MG TABS tablet Take 2.5 mg by mouth 2 (two) times daily.  . ARIPiprazole (ABILIFY) 30 MG tablet Take 30 mg by mouth at bedtime.    [name] aspirin EC 81 MG tablet Take 81 mg by mouth daily.  [name] augmented betamethasone dipropionate (DIPROLENE-AF) 0.05 % cream Apply topically 2 (two) times daily. Apply to the rash of the right side of the face  . calcium-vitamin D (CALCIUM 500/D) 500-200 MG-UNIT tablet Take 1 tablet by mouth daily with breakfast.   . clotrimazole (LOTRIMIN) 1 % cream APPLY TO AFFECTED AREA TWICE A DAY  . divalproex (DEPAKOTE ER) 500 MG 24 hr tablet Take 1,500 mg by mouth daily. 3 tabs at bedtime  . divalproex (DEPAKOTE) 250 MG DR tablet Take 250 mg by mouth daily with lunch.   . folic acid (FOLVITE) 1 MG tablet Take 1 mg by mouth daily.    [name]  ketoconazole (NIZORAL) 2 % shampoo LATHER AND MASSAGE INTO SCALP 2-3 TIMES PER WEEK. LEAVE FOR 5 MINUTES BEFORE RINSING.  . Melatonin 3 MG TABS Take 3 mg by mouth at bedtime.  [name]  mirabegron ER (MYRBETRIQ) 50 MG TB24 tablet Take 50 mg by mouth daily.  . Omega-3 1000 MG CAPS Take 1 capsule by mouth daily.   [name] omeprazole (PRILOSEC) 20 MG capsule Take 20 mg by mouth every morning.   [name] oxybutynin (DITROPAN) 5 MG tablet Take 5 mg by mouth 2 (two) times daily.  . predniSONE (DELTASONE) 5 MG tablet Take 5 mg by mouth daily with breakfast.  . traMADol (ULTRAM) 50 MG tablet Take 1 tablet (50 mg total) by mouth every 6 (six) hours as needed.  . vitamin C (ASCORBIC ACID) 500 MG tablet Take 500 mg by mouth daily.  . ziprasidone (GEODON) 20 MG capsule Take 20 mg by mouth daily at 12 noon.  . ziprasidone (GEODON) 80 MG capsule Take 80 mg by mouth daily.  . OXYGEN Inhale into the lungs. To use if O2 drops below 85 %  . Tofacitinib Citrate (XELJANZ XR) 11 MG TB24 Take 1 tablet by mouth daily.   No facility-administered encounter medications on file as of 08/28/2018.     Activities of Daily Living In your present state of health, do you have any difficulty performing the following activities: 08/28/2018 12/26/2017  Hearing? N N  Vision? N N  Comment wears glasses -  Difficulty concentrating or making decisions? Y N  Walking or climbing stairs? Y Y  Dressing or bathing? Y Y  Doing errands, shopping? [name]  Preparing Food and eating ? Y -  Using the Toilet? Y -  In the past six months, have you accidently leaked urine? Y -  Comment uses urinal -  Do you have problems with loss of bowel control? Y -  Managing your Medications? Y -  Managing your Finances? Y -  Housekeeping or managing your Housekeeping? Y -  Some recent data might be hidden    Patient Care Team: Colon Branch, MD as PCP - General (Internal Medicine) [name], MD as Consulting Physician (Rheumatology) Sherren Mocha, MD as Consulting  Physician (Cardiology) Javier Glazier, MD as Consulting Physician (Pulmonary Disease) Bea Laura, MD as Consulting Physician (Internal Medicine) Franchot Gallo, MD as Consulting Physician (Urology) Vickey Huger, MD as Consulting Physician (Orthopedic Surgery) [name], MD as Consulting Physician (Oncology)   Assessment:   This is a routine wellness examination for Jatavious. Physical assessment deferred to PCP.  Exercise Activities and Dietary recommendations Current Exercise Habits: The patient does not participate in regular exercise at present, Exercise limited by: neurologic condition(s) Diet (meal preparation, eat out, water intake, caffeinated beverages, dairy products, fruits and vegetables): well balanced, on average, 3 meals per day     Goals    . DIET - EAT MORE FRUITS AND VEGETABLES       Fall Risk Fall Risk  08/28/2018 02/20/2018 01/05/2018 12/06/2016 10/24/2016  Falls in the past year? 1 No Exclusion - non ambulatory Yes Yes  Comment - Emmi Telephone Survey: data to providers prior to load - last fall september 2017 -  Number falls in past yr: 1 - - 2 or more 2 or more  Injury with Fall? 0 - - No -  Risk Factor Category  - - - High Fall Risk -  Risk for fall due to : Impaired balance/gait;Impaired mobility;History of fall(s) - - - -  Risk for fall due to: Comment - - - - -  Follow up - - - Falls prevention discussed;Education provided Education provided    Depression Screen [facility] 2/9 Scores 08/28/2018 12/06/2016 10/24/2016  03/01/2016  PHQ - 2 Score 0 3 0 0  PHQ- 9 Score - 9 - -    Cognitive Function  MMSE - Mini Mental State Exam 08/28/2018 10/24/2016 10/24/2015  Orientation to time 5 5 5   Orientation to Place 5 5 5   Registration 3 3 3   Attention/ Calculation 5 5 5   Recall 2 1 2   Language- name 2 objects 2 2 2   Language- repeat 1 1 1   Language- follow 3 step command 3 3 3   Language- read & follow direction 1 1 1   Write a sentence 1 1 1   Copy design 1 1 1     Total score 29 28 29         Immunization History  Administered Date(s) Administered  . Influenza Split 04/12/2017  . Influenza, High Dose Seasonal PF 05/23/2015, 04/03/2016  . Influenza-Unspecified 04/21/2014  . Pneumococcal Conjugate-13 04/21/2014  . Pneumococcal Polysaccharide-23 05/23/2015  . Tdap 11/19/2010   Screening Tests Health Maintenance  Topic Date Due  . INFLUENZA VACCINE  10/20/2018 (Originally 02/19/2018)  . TETANUS/TDAP  11/18/2020  . COLONOSCOPY  11/09/2024  . Hepatitis C Screening  Completed  . PNA vac Low Risk Adult  Completed       Plan:    Please schedule your next medicare wellness visit with me in 1 yr.  Eat heart healthy diet (full of fruits, vegetables, whole grains, lean protein, water--limit salt, fat, and sugar intake)   Continue doing brain stimulating activities (puzzles, reading, adult coloring books, staying active) to keep memory sharp.    I have personally reviewed and noted the following in the patient's chart:   . Medical and social history . Use of alcohol, tobacco or illicit drugs  . Current medications and supplements . Functional ability and status . Nutritional status . Physical activity . Advanced directives . List of other physicians . Hospitalizations, surgeries, and ER visits in previous 12 months . Vitals . Screenings to include cognitive, depression, and falls . Referrals and appointments  In addition, I have reviewed and discussed with patient certain preventive protocols, quality metrics, and best practice recommendations. A written personalized care plan for preventive services as well as general preventive health recommendations were provided to patient.     Shela Nevin, [place]  08/28/2018  Reviewed and agreed with assesment & plan of RN.  Mackie Pai, PA-C

## 2018-08-28 ENCOUNTER — Ambulatory Visit (INDEPENDENT_AMBULATORY_CARE_PROVIDER_SITE_OTHER): Payer: Medicare Other | Admitting: *Deleted

## 2018-08-28 ENCOUNTER — Encounter: Payer: Self-pay | Admitting: *Deleted

## 2018-08-28 VITALS — BP 132/82 | HR 69 | Ht 61.0 in | Wt 210.0 lb

## 2018-08-28 DIAGNOSIS — Z0000 Encounter for general adult medical examination without abnormal findings: Secondary | ICD-10-CM

## 2018-08-28 NOTE — Patient Instructions (Signed)
Please schedule your next medicare wellness visit with me in 1 yr.  Eat heart healthy diet (full of fruits, vegetables, whole grains, lean protein, water--limit salt, fat, and sugar intake)   Continue doing brain stimulating activities (puzzles, reading, adult coloring books, staying active) to keep memory sharp.    Mr. Shaun Yoder , Thank you for taking time to come for your Medicare Wellness Visit. I appreciate your ongoing commitment to your health goals. Please review the following plan we discussed and let me know if I can assist you in the future.   These are the goals we discussed: Goals    . DIET - EAT MORE FRUITS AND VEGETABLES       This is a list of the screening recommended for you and due dates:  Health Maintenance  Topic Date Due  . Flu Shot  10/20/2018*  . Tetanus Vaccine  11/18/2020  . Colon Cancer Screening  11/09/2024  .  Hepatitis C: One time screening is recommended by Center for Disease Control  (CDC) for  adults born from [birth year] through 1965.   Completed  . Pneumonia vaccines  Completed  *Topic was postponed. The date shown is not the original due date.    Health Maintenance After Age 49 After age 52, you are at a higher risk for certain long-term diseases and infections as well as injuries from falls. Falls are a major cause of broken bones and head injuries in people who are older than age 49. Getting regular preventive care can help to keep you healthy and well. Preventive care includes getting regular testing and making lifestyle changes as recommended by your health care provider. Talk with your health care provider about:  Which screenings and tests you should have. A screening is a test that checks for a disease when you have no symptoms.  A diet and exercise plan that is right for you. What should I know about screenings and tests to prevent falls? Screening and testing are the best ways to find a health problem early. Early diagnosis and treatment give you  the best chance of managing medical conditions that are common after age 82. Certain conditions and lifestyle choices may make you more likely to have a fall. Your health care provider may recommend:  Regular vision checks. Poor vision and conditions such as cataracts can make you more likely to have a fall. If you wear glasses, make sure to get your prescription updated if your vision changes.  Medicine review. Work with your health care provider to regularly review all of the medicines you are taking, including over-the-counter medicines. Ask your health care provider about any side effects that may make you more likely to have a fall. Tell your health care provider if any medicines that you take make you feel dizzy or sleepy.  Osteoporosis screening. Osteoporosis is a condition that causes the bones to get weaker. This can make the bones weak and cause them to break more easily.  Blood pressure screening. Blood pressure changes and medicines to control blood pressure can make you feel dizzy.  Strength and balance checks. Your health care provider may recommend certain tests to check your strength and balance while standing, walking, or changing positions.  Foot health exam. Foot pain and numbness, as well as not wearing proper footwear, can make you more likely to have a fall.  Depression screening. You may be more likely to have a fall if you have a fear of falling, feel emotionally low, or feel  unable to do activities that you used to do.  Alcohol use screening. Using too much alcohol can affect your balance and may make you more likely to have a fall. What actions can I take to lower my risk of falls? General instructions  Talk with your health care provider about your risks for falling. Tell your health care provider if: ? You fall. Be sure to tell your health care provider about all falls, even ones that seem minor. ? You feel dizzy, sleepy, or off-balance.  Take over-the-counter and  prescription medicines only as told by your health care provider. These include any supplements.  Eat a healthy diet and maintain a healthy weight. A healthy diet includes low-fat dairy products, low-fat (lean) meats, and fiber from whole grains, beans, and lots of fruits and vegetables. Home safety  Remove any tripping hazards, such as rugs, cords, and clutter.  Install safety equipment such as grab bars in bathrooms and safety rails on stairs.  Keep rooms and walkways well-lit. Activity   Follow a regular exercise program to stay fit. This will help you maintain your balance. Ask your health care provider what types of exercise are appropriate for you.  If you need a cane or walker, use it as recommended by your health care provider.  Wear supportive shoes that have nonskid soles. Lifestyle  Do not drink alcohol if your health care provider tells you not to drink.  If you drink alcohol, limit how much you have: ? 0-1 drink a day for women. ? 0-2 drinks a day for men.  Be aware of how much alcohol is in your drink. In the [place] one drink equals one typical bottle of beer (12 oz), one-half glass of wine (5 oz), or one shot of hard liquor (1 oz).  Do not use any products that contain nicotine or tobacco, such as cigarettes and e-cigarettes. If you need help quitting, ask your health care provider. Summary  Having a healthy lifestyle and getting preventive care can help to protect your health and wellness after age 59.  Screening and testing are the best way to find a health problem early and help you avoid having a fall. Early diagnosis and treatment give you the best chance for managing medical conditions that are more common for people who are older than age 31.  Falls are a major cause of broken bones and head injuries in people who are older than age 80. Take precautions to prevent a fall at home.  Work with your health care provider to learn what changes you can make to  improve your health and wellness and to prevent falls. This information is not intended to replace advice given to you by your health care provider. Make sure you discuss any questions you have with your health care provider. Document Released: 05/21/2017 Document Revised: 05/21/2017 Document Reviewed: 05/21/2017 Elsevier Interactive Patient Education  2019 [employer].

## 2018-08-30 ENCOUNTER — Telehealth: Payer: Self-pay | Admitting: Internal Medicine

## 2018-08-31 NOTE — Telephone Encounter (Signed)
Refill request for tramadol.   Last OV: 05/18/2018 Last Fill: 07/29/2018 #30 and 0RF UDS: None  NCCR in media 05/18/2018

## 2018-08-31 NOTE — Telephone Encounter (Signed)
Sent!

## 2018-09-08 DIAGNOSIS — L89312 Pressure ulcer of right buttock, stage 2: Secondary | ICD-10-CM | POA: Diagnosis not present

## 2018-09-08 DIAGNOSIS — I890 Lymphedema, not elsewhere classified: Secondary | ICD-10-CM | POA: Diagnosis not present

## 2018-09-08 DIAGNOSIS — I872 Venous insufficiency (chronic) (peripheral): Secondary | ICD-10-CM | POA: Diagnosis not present

## 2018-09-08 DIAGNOSIS — L97821 Non-pressure chronic ulcer of other part of left lower leg limited to breakdown of skin: Secondary | ICD-10-CM | POA: Diagnosis not present

## 2018-09-08 DIAGNOSIS — L97222 Non-pressure chronic ulcer of left calf with fat layer exposed: Secondary | ICD-10-CM | POA: Diagnosis not present

## 2018-09-08 DIAGNOSIS — L98492 Non-pressure chronic ulcer of skin of other sites with fat layer exposed: Secondary | ICD-10-CM | POA: Diagnosis not present

## 2018-09-14 ENCOUNTER — Telehealth: Payer: Self-pay | Admitting: Internal Medicine

## 2018-09-14 NOTE — Telephone Encounter (Signed)
Copied from [place] [id number]. Topic: Quick Communication - Rx Refill/Question >> Sep 14, 2018  4:09 PM Margot Ables wrote: Medication: clotrimazole (LOTRIMIN) 1 % cream - pt called stating he is still having a yeast infection in groin area - he said the medication has not resolved the issue ongoing for over a month and asking if there is a stronger medication or if he needs to see a specialist   Has the patient contacted their pharmacy? N/A Preferred Pharmacy (with phone number or street name): [id number] - [place], [place] - [address] [phone] (Phone) [phone] (Fax)

## 2018-09-14 NOTE — Telephone Encounter (Signed)
Arrange office visit please

## 2018-09-14 NOTE — Telephone Encounter (Signed)
Please inform Pt to schedule appt w/ PCP to discuss/evaluate. Thank you.

## 2018-09-14 NOTE — Telephone Encounter (Signed)
Please advise 

## 2018-09-16 ENCOUNTER — Encounter: Payer: Self-pay | Admitting: Internal Medicine

## 2018-09-16 ENCOUNTER — Ambulatory Visit (INDEPENDENT_AMBULATORY_CARE_PROVIDER_SITE_OTHER): Payer: Medicare Other | Admitting: Internal Medicine

## 2018-09-16 VITALS — BP 126/72 | HR 78 | Temp 98.1°F | Resp 18 | Ht 61.0 in | Wt 215.0 lb

## 2018-09-16 DIAGNOSIS — R399 Unspecified symptoms and signs involving the genitourinary system: Secondary | ICD-10-CM | POA: Diagnosis not present

## 2018-09-16 DIAGNOSIS — M05719 Rheumatoid arthritis with rheumatoid factor of unspecified shoulder without organ or systems involvement: Secondary | ICD-10-CM

## 2018-09-16 DIAGNOSIS — L309 Dermatitis, unspecified: Secondary | ICD-10-CM

## 2018-09-16 MED ORDER — KETOCONAZOLE 2 % EX CREA: 1.0000 "application " | TOPICAL_CREAM | Freq: Every day | CUTANEOUS | 0 refills | Status: DC

## 2018-09-16 NOTE — Progress Notes (Signed)
Subjective:    Patient ID: Shaun Yoder, male    DOB: 13-Jan-1946, 73 y.o.   MRN: [id number]  DOS:  09/16/2018 Type of visit - description: Acute visit, here with his wife About 3 weeks ago, he started to develop rash at the groins.  Worse on the left.  Have been using a leftover clotrimazole without much help. No itching or oozing.  Also, he has chronic urinary frequency, much worse in the last 7 to 10 days, goes to the bathroom basically every 20 minutes.  Review of Systems  Denies fever chills No nausea or vomiting No suprapubic pain Urine slightly dark but not frank hematuria  Past Medical History:  Diagnosis Date  . Anemia   . Cancer ([place])    bladder  . Chronic venous insufficiency   . Colitis   . Colon polyps   . Depression   . Diverticulosis   . DVT of deep femoral vein ([place])   . Gastritis   . GERD (gastroesophageal reflux disease)   . History of rectal polyps   . Hyperlipidemia   . Hypertension   . Iron deficiency   . Low back pain   . Lymphedema of both lower extremities   . OA (osteoarthritis)   . Osteonecrosis of shoulder region ([place])   . Schizoaffective disorder, bipolar type ([place])   . Sleep apnea   . Varicose vein     Past Surgical History:  Procedure Laterality Date  . BLADDER SURGERY    . TOTAL KNEE ARTHROPLASTY Bilateral   . VARICOSE VEIN SURGERY      Social History   Socioeconomic History  . Marital status: Married    Spouse name: Not on file  . Number of children: 2  . Years of education: Not on file  . Highest education level: Not on file  Occupational History  . Occupation: retired  Scientific laboratory technician  . Financial resource strain: Not on file  . Food insecurity:    Worry: Not on file    Inability: Not on file  . Transportation needs:    Medical: Not on file    Non-medical: Not on file  Tobacco Use  . Smoking status: Former Smoker    Packs/day: 2.50    Years: 43.00    Pack years: 107.50    Types: Cigarettes    Last attempt  to quit: 07/22/1998    Years since quitting: 20.1  . Smokeless tobacco: Never Used  Substance and Sexual Activity  . Alcohol use: Not Currently    Alcohol/week: 0.0 standard drinks  . Drug use: No  . Sexual activity: Not Currently  Lifestyle  . Physical activity:    Days per week: Not on file    Minutes per session: Not on file  . Stress: Not on file  Relationships  . Social connections:    Talks on phone: Not on file    Gets together: Not on file    Attends religious service: Not on file    Active member of club or organization: Not on file    Attends meetings of clubs or organizations: Not on file    Relationship status: Not on file  . Intimate partner violence:    Fear of current or ex partner: Not on file    Emotionally abused: Not on file    Physically abused: Not on file    Forced sexual activity: Not on file  Other Topics Concern  . Not on file  Social History Narrative  Originally from [place]. Moved to [facility] in July 22, 1985. 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 No known asbestos exposure. 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 No international travel. Has a dog currently. Remote exposure to Cockatiel in a different home. No mold exposure. No hot tub exposure.       Two adopted children      Allergies as of 09/16/2018      Reactions   Leflunomide Other (See Comments)   diarrhea   Ancef [cefazolin]    Rash   Morphine And Related Nausea And Vomiting   Plaquenil [hydroxychloroquine Sulfate]    rash      Medication List       Accurate as of September 16, 2018 11:59 PM. Always use your most recent med list.        ARIPiprazole 30 MG tablet Commonly known as:  ABILIFY Take 30 mg by mouth at bedtime.   aspirin EC 81 MG tablet Take 81 mg by mouth daily.   augmented betamethasone dipropionate 0.05 %  cream Commonly known as:  DIPROLENE-AF Apply topically 2 (two) times daily. Apply to the rash of the right side of the face   CALCIUM 500/D 500-200 MG-UNIT tablet Generic drug:  calcium-vitamin D Take 1 tablet by mouth daily with breakfast.   divalproex 250 MG DR tablet Commonly known as:  DEPAKOTE Take 250 mg by mouth daily with lunch.   divalproex 500 MG 24 hr tablet Commonly known as:  DEPAKOTE ER Take 1,500 mg by mouth daily. 3 tabs at bedtime   ELIQUIS 5 MG Tabs tablet Generic drug:  apixaban Take 2.5 mg by mouth 2 (two) times daily.   folic acid 1 MG tablet Commonly known as:  FOLVITE Take 1 mg by mouth daily.   ketoconazole 2 % shampoo Commonly known as:  NIZORAL LATHER AND MASSAGE INTO SCALP 2-3 TIMES PER WEEK. LEAVE FOR 5 MINUTES BEFORE RINSING.   ketoconazole 2 % cream Commonly known as:  NIZORAL Apply 1 application topically daily.   Melatonin 3 MG Tabs Take 3 mg by mouth at bedtime.   mirabegron ER 50 MG Tb24 tablet Commonly known as:  MYRBETRIQ Take 50 mg by mouth daily.   Omega-3 1000 MG Caps Take 1 capsule by mouth daily.   omeprazole 20 MG capsule Commonly known as:  PRILOSEC Take 20 mg by mouth every morning.   oxybutynin 5 MG tablet Commonly known as:  DITROPAN Take 5 mg by mouth 2 (two) times daily.   predniSONE 5 MG tablet Commonly known as:  DELTASONE Take 5 mg by mouth daily with breakfast.   traMADol 50 MG tablet Commonly known as:  ULTRAM TAKE 1 TABLET (50 MG TOTAL) BY MOUTH EVERY 6 (SIX) HOURS AS NEEDED.   vitamin C 500 MG tablet Commonly known as:  ASCORBIC ACID Take 500 mg by mouth daily.   XELJANZ XR 11 MG Tb24 Generic drug:  Tofacitinib Citrate ER Take 1 tablet by mouth daily.   ziprasidone 80 MG capsule Commonly known as:  GEODON Take 80 mg by mouth daily.   ziprasidone 20 MG capsule Commonly known as:  GEODON Take 20 mg by mouth daily at 12 noon.           Objective:   Physical  Exam Genitourinary:     BP 126/72 (BP Location: Left  Arm, Patient Position: Sitting, Cuff Size: Normal)   Pulse 78   Temp 98.1 F (36.7 C) (Oral)   Resp 18   Ht 5' 1" (1.549 m)   Wt 215 lb (97.5 kg)   SpO2 90%   BMI 40.62 kg/m  General:   Well developed, NAD, BMI noted. HEENT:  Normocephalic . Face symmetric, atraumatic Abdomen: Soft, not tender.  Exam performed while the patient was sitting.  Skin: See graphic Neurologic:  alert & oriented X3.  Speech normal, gait not tested, patient sits in a wheelchair. Psych--  Cognition and judgment appear intact.  Cooperative with normal attention span and concentration.  Behavior appropriate. No anxious or depressed appearing.      Assessment      Assessment   (new pt, transfer from [facility],02-2015) Hypertension Hyperlipidemia Ao stenosis, severe: Dr Burt Knack MSK: --Seronegative Rheumatoid arthritis-- Dr Lenna Gilford  --DJD-- DR Tamera Punt , dr Ronnie Derby --low back pain d/t DJD, Dr Sherlyn Lick  PSYCH: Schizophrenia, bipolar, depression: Follow-up at the [place] in [facility] GU: Bladder cancer-transitional cell, Dr. Shona Needles GI:   GERD, history of colitis, history of gastritis,  colon polyps- had a virtual cscope 2016, + polyps, declined further eval Pulmomany: Dr Ashok Cordia --Sleep apnea:  Cpap intolerant --CT nodules f/u 2 pulmonary  --hypoxemia, dx 01-2017, unable to get O2 from medicare, eventually got O2 home-portable ~08-2017; off O2 by ~ 05/2018 Hematology: L LEG DVT 02-2016 + Lupus anticoagulant Varicose veins: s/p remote surgery, has LE wounds  H[facility]  [facility] deficiency [facility], Ortho, pain mngmt   PLAN  Dermatitis: Likely fungal, recommend Nizoral cream twice a day, keep the area as dry as possible. Leg wound: Seen at the wound clinic 09/08/2018 Rheumatoid arthritis: Met with a new rheumatologist at the [facility] yesterday, labs and x-rays done, he usually takes 5 mg of prednisone daily, he is now taking a higher   taper dose. Hypoxemia: The patient reports today that he is no longer using oxygen since approximately November 2018 UTI, history of bladder cancer:  Has chronic urinary frequency,  much worse lately.  Check a UA, urine culture, treat accordingly.  Otherwise follow-up with his urologist as schedule in few weeks

## 2018-09-16 NOTE — Patient Instructions (Signed)
Provide a urine sample  Applied the  cream twice a day for 10 days  Try to keep the area as dry as possible, after you take your bath, pat it dry  Use powder or talc as needed

## 2018-09-16 NOTE — Progress Notes (Signed)
Pre visit review using our clinic review tool, if applicable. No additional management support is needed unless otherwise documented below in the visit note. 

## 2018-09-17 LAB — URINE CULTURE
MICRO NUMBER:: 245512
SPECIMEN QUALITY:: ADEQUATE

## 2018-09-17 LAB — URINALYSIS, ROUTINE W REFLEX MICROSCOPIC
BILIRUBIN URINE: NEGATIVE
Nitrite: NEGATIVE
Specific Gravity, Urine: 1.025 (ref 1.000–1.030)
TOTAL PROTEIN, URINE-UPE24: NEGATIVE
URINE GLUCOSE: NEGATIVE
UROBILINOGEN UA: 2 — AB (ref 0.0–1.0)
pH: 6 (ref 5.0–8.0)

## 2018-09-17 NOTE — Assessment & Plan Note (Signed)
Dermatitis: Likely fungal, recommend Nizoral cream twice a day, keep the area as dry as possible. Leg wound: Seen at the wound clinic 09/08/2018 Rheumatoid arthritis: Met with a new rheumatologist at the [facility] yesterday, labs and x-rays done, he usually takes 5 mg of prednisone daily, he is now taking a higher  taper dose. Hypoxemia: The patient reports today that he is no longer using oxygen since approximately November 2018 UTI, history of bladder cancer:  Has chronic urinary frequency,  much worse lately.  Check a UA, urine culture, treat accordingly.  Otherwise follow-up with his urologist as schedule in few weeks

## 2018-09-21 ENCOUNTER — Telehealth: Payer: Self-pay | Admitting: Internal Medicine

## 2018-09-21 DIAGNOSIS — I350 Nonrheumatic aortic (valve) stenosis: Secondary | ICD-10-CM | POA: Diagnosis not present

## 2018-09-21 NOTE — Telephone Encounter (Signed)
Copied from [place] [phone] [phone]. Topic: Quick Communication - See Telephone Encounter >> Sep 21, 2018  8:59 AM Bea Graff, NT wrote: CRM for notification. See Telephone encounter for: 09/21/18. Pts wife requesting a call to discuss husbands lab results.

## 2018-09-21 NOTE — Telephone Encounter (Signed)
Pt will receive call once labs have been resulted by PCP.

## 2018-09-22 DIAGNOSIS — I493 Ventricular premature depolarization: Secondary | ICD-10-CM | POA: Diagnosis not present

## 2018-09-22 DIAGNOSIS — I452 Bifascicular block: Secondary | ICD-10-CM | POA: Diagnosis not present

## 2018-09-23 DIAGNOSIS — N3941 Urge incontinence: Secondary | ICD-10-CM | POA: Diagnosis not present

## 2018-09-23 DIAGNOSIS — N3000 Acute cystitis without hematuria: Secondary | ICD-10-CM | POA: Diagnosis not present

## 2018-09-23 DIAGNOSIS — C678 Malignant neoplasm of overlapping sites of bladder: Secondary | ICD-10-CM | POA: Diagnosis not present

## 2018-09-23 DIAGNOSIS — R310 Gross hematuria: Secondary | ICD-10-CM | POA: Diagnosis not present

## 2018-10-24 ENCOUNTER — Other Ambulatory Visit: Payer: Self-pay | Admitting: Internal Medicine

## 2018-10-24 DIAGNOSIS — L219 Seborrheic dermatitis, unspecified: Secondary | ICD-10-CM

## 2018-11-17 ENCOUNTER — Other Ambulatory Visit: Payer: Self-pay

## 2018-11-17 ENCOUNTER — Encounter: Payer: Self-pay | Admitting: Internal Medicine

## 2018-11-17 ENCOUNTER — Ambulatory Visit (INDEPENDENT_AMBULATORY_CARE_PROVIDER_SITE_OTHER): Payer: Medicare Other | Admitting: Internal Medicine

## 2018-11-17 DIAGNOSIS — F250 Schizoaffective disorder, bipolar type: Secondary | ICD-10-CM

## 2018-11-17 DIAGNOSIS — M05719 Rheumatoid arthritis with rheumatoid factor of unspecified shoulder without organ or systems involvement: Secondary | ICD-10-CM | POA: Diagnosis not present

## 2018-11-17 DIAGNOSIS — I10 Essential (primary) hypertension: Secondary | ICD-10-CM | POA: Diagnosis not present

## 2018-11-17 DIAGNOSIS — E785 Hyperlipidemia, unspecified: Secondary | ICD-10-CM | POA: Diagnosis not present

## 2018-11-17 NOTE — Progress Notes (Signed)
Subjective:    Patient ID: Shaun Yoder, male    DOB: 09-18-1945, 73 y.o.   MRN: [id number]  DOS:  11/17/2018 Type of visit - description: Virtual Visit via Video Note  I connected with@ on 11/18/18 at  1:00 PM EDT by a video enabled telemedicine application and verified that I am speaking with the correct person using two identifiers.   THIS ENCOUNTER IS A VIRTUAL VISIT DUE TO COVID-19 - PATIENT WAS NOT SEEN IN THE OFFICE. PATIENT HAS CONSENTED TO VIRTUAL VISIT / TELEMEDICINE VISIT   Location of patient: home  Location of provider: office  I discussed the limitations of evaluation and management by telemedicine and the availability of in person appointments. The patient expressed understanding and agreed to proceed.  History of Present Illness: Follow-up I talk with the patient and his wife. We went over his medications and labs. In general he is feeling well, at baseline. He is mood is very good "most of the time".   Review of Systems Denies fever chills No chest pain, breathing at baseline.  No cough. COVID-19: Staying sheltered at home, asymptomatic from that standpoint  Past Medical History:  Diagnosis Date  . Anemia   . Cancer ([place])    bladder  . Chronic venous insufficiency   . Colitis   . Colon polyps   . Depression   . Diverticulosis   . DVT of deep femoral vein ([place])   . Gastritis   . GERD (gastroesophageal reflux disease)   . History of rectal polyps   . Hyperlipidemia   . Hypertension   . Iron deficiency   . Low back pain   . Lymphedema of both lower extremities   . OA (osteoarthritis)   . Osteonecrosis of shoulder region ([place])   . Schizoaffective disorder, bipolar type ([place])   . Sleep apnea   . Varicose vein     Past Surgical History:  Procedure Laterality Date  . BLADDER SURGERY    . TOTAL KNEE ARTHROPLASTY Bilateral   . VARICOSE VEIN SURGERY      Social History   Socioeconomic History  . Marital status: Married    Spouse name:  Not on file  . Number of children: 2  . Years of education: Not on file  . Highest education level: Not on file  Occupational History  . Occupation: retired  Scientific laboratory technician  . Financial resource strain: Not on file  . Food insecurity:    Worry: Not on file    Inability: Not on file  . Transportation needs:    Medical: Not on file    Non-medical: Not on file  Tobacco Use  . Smoking status: Former Smoker    Packs/day: 2.50    Years: 43.00    Pack years: 107.50    Types: Cigarettes    Last attempt to quit: 07/22/1998    Years since quitting: 20.3  . Smokeless tobacco: Never Used  Substance and Sexual Activity  . Alcohol use: Not Currently    Alcohol/week: 0.0 standard drinks  . Drug use: No  . Sexual activity: Not Currently  Lifestyle  . Physical activity:    Days per week: Not on file    Minutes per session: Not on file  . Stress: Not on file  Relationships  . Social connections:    Talks on phone: Not on file    Gets together: Not on file    Attends religious service: Not on file    Active member of  club or organization: Not on file    Attends meetings of clubs or organizations: Not on file    Relationship status: Not on file  . Intimate partner violence:    Fear of current or ex partner: Not on file    Emotionally abused: Not on file    Physically abused: Not on file    Forced sexual activity: Not on file  Other Topics Concern  . Not on file  Social History Narrative   Originally from [place]. Moved to [facility] in July 22, 1985. 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 No known asbestos exposure. 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 No international travel. Has a dog currently. Remote exposure to Cockatiel in a different home. No mold exposure. No hot tub exposure.       Two adopted children      Allergies as of  11/17/2018      Reactions   Leflunomide Other (See Comments)   diarrhea   Ancef [cefazolin]    Rash   Morphine And Related Nausea And Vomiting   Plaquenil [hydroxychloroquine Sulfate]    rash      Medication List       Accurate as of November 17, 2018 11:59 PM. Always use your most recent med list.        ARIPiprazole 30 MG tablet Commonly known as:  ABILIFY Take 30 mg by mouth at bedtime.   aspirin EC 81 MG tablet Take 81 mg by mouth daily.   augmented betamethasone dipropionate 0.05 % cream Commonly known as:  DIPROLENE-AF Apply topically 2 (two) times daily. Apply to the rash of the right side of the face   Calcium 500/D 500-200 MG-UNIT tablet Generic drug:  calcium-vitamin D Take 1 tablet by mouth daily with breakfast.   divalproex 250 MG DR tablet Commonly known as:  DEPAKOTE Take 250 mg by mouth daily with lunch.   divalproex 500 MG 24 hr tablet Commonly known as:  DEPAKOTE ER Take 1,500 mg by mouth daily. 3 tabs at bedtime   Eliquis 5 MG Tabs tablet Generic drug:  apixaban Take 2.5 mg by mouth 2 (two) times daily.   folic acid 1 MG tablet Commonly known as:  FOLVITE Take 1 mg by mouth daily.   ketoconazole 2 % cream Commonly known as:  NIZORAL Apply 1 application topically daily.   ketoconazole 2 % shampoo Commonly known as:  NIZORAL LATHER AND MASSAGE INTO SCALP 2-3 TIMES PER WEEK. LEAVE FOR 5 MINUTES BEFORE RINSING.   Melatonin 3 MG Tabs Take 3 mg by mouth at bedtime.   mirabegron ER 50 MG Tb24 tablet Commonly known as:  MYRBETRIQ Take 50 mg by mouth daily.   Omega-3 1000 MG Caps Take 1 capsule by mouth daily.   omeprazole 20 MG capsule Commonly known as:  PRILOSEC Take 20 mg by mouth every morning.   oxybutynin 5 MG tablet Commonly known as:  DITROPAN Take 5 mg by mouth 2 (two) times daily.   predniSONE 5 MG tablet Commonly known as:  DELTASONE Take 5 mg by mouth daily with breakfast.   traMADol 50 MG tablet Commonly known as:   ULTRAM TAKE 1 TABLET (50 MG TOTAL) BY MOUTH EVERY 6 (SIX) HOURS AS NEEDED.   vitamin C 500 MG tablet Commonly known as:  ASCORBIC ACID Take 500 mg by  mouth daily.   ziprasidone 80 MG capsule Commonly known as:  GEODON Take 80 mg by mouth daily.   ziprasidone 20 MG capsule Commonly known as:  GEODON Take 20 mg by mouth daily at 12 noon.           Objective:   Physical Exam There were no vitals taken for this visit. This is a video visit, alert oriented x3, no distress.  BP today 110/87.  Weight today 220 pounds.  Patient is sitting in a wheelchair with no apparent distress    Assessment      Assessment   (new pt, transfer from [facility],02-2015) Hypertension Hyperlipidemia Ao stenosis, severe: Dr Burt Knack MSK: --Seronegative Rheumatoid arthritis-- Dr Lenna Gilford  --DJD-- DR Tamera Punt , dr Ronnie Derby --low back pain d/t DJD, Dr Sherlyn Lick  PSYCH: Schizophrenia, bipolar, depression: Follow-up at the [place] in [facility] GU: Bladder cancer-transitional cell, Dr. Shona Needles GI:   GERD, history of colitis, history of gastritis,  colon polyps- had a virtual cscope 2016, + polyps, declined further eval Pulmomany: Dr Ashok Cordia --Sleep apnea:  Cpap intolerant --CT nodules f/u 2 pulmonary  --hypoxemia, dx 01-2017, unable to get O2 from medicare, eventually got O2 home-portable ~08-2017; off O2 by ~ 05/2018 Hematology: L LEG DVT 02-2016 + Lupus anticoagulant Varicose veins: s/p remote surgery, has LE wounds  H/o  iron deficiency anemia [facility], PCP, psych, Ortho, pain mngmt   PLAN  This is a video visit HTN: Currently on no medications, BP today is 110/87 Hyperlipidemia: Not on medications, due for a FLP Rheumatoid arthritis: Currently on prednisone, [facility]. Schizophrenia, bipolar, depression: Doing well most of the time, he looks very well today actually calm, cooperative.  Better than baseline.  On Abilify, Depakote, Geodon. Lupus anticoagulant: Reports  good compliance with Eliquis. History of wounds and ulcers: Reports no skin problems of that nature at this time Under normal circumstances, I would do a routine labs but he is high risk for COVID-19 and I think is safer for him to stay home. RTC 3 months face-to-face if possible.  Will call and schedule    I discussed the assessment and treatment plan with the patient. The patient was provided an opportunity to ask questions and all were answered. The patient agreed with the plan and demonstrated an understanding of the instructions.   The patient was advised to call back or seek an in-person evaluation if the symptoms worsen or if the condition fails to improve as anticipated.

## 2018-11-18 NOTE — Assessment & Plan Note (Signed)
This is a video visit HTN: Currently on no medications, BP today is 110/87 Hyperlipidemia: Not on medications, due for a FLP Rheumatoid arthritis: Currently on prednisone, follow-[facility]. Schizophrenia, bipolar, depression: Doing well most of the time, he looks very well today actually calm, cooperative.  Better than baseline.  On Abilify, Depakote, Geodon. Lupus anticoagulant: Reports good compliance with Eliquis. History of wounds and ulcers: Reports no skin problems of that nature at this time Under normal circumstances, I would do a routine labs but he is high risk for COVID-19 and I think is safer for him to stay home. RTC 3 months face-to-face if possible.  Will call and schedule

## 2018-11-19 ENCOUNTER — Other Ambulatory Visit: Payer: Self-pay | Admitting: *Deleted

## 2018-11-19 DIAGNOSIS — I890 Lymphedema, not elsewhere classified: Secondary | ICD-10-CM | POA: Diagnosis not present

## 2018-11-19 DIAGNOSIS — I872 Venous insufficiency (chronic) (peripheral): Secondary | ICD-10-CM | POA: Diagnosis not present

## 2018-11-19 DIAGNOSIS — L97921 Non-pressure chronic ulcer of unspecified part of left lower leg limited to breakdown of skin: Secondary | ICD-10-CM | POA: Diagnosis not present

## 2018-11-19 DIAGNOSIS — I87311 Chronic venous hypertension (idiopathic) with ulcer of right lower extremity: Secondary | ICD-10-CM | POA: Diagnosis not present

## 2018-11-19 DIAGNOSIS — S90521A Blister (nonthermal), right ankle, initial encounter: Secondary | ICD-10-CM | POA: Diagnosis not present

## 2018-11-19 DIAGNOSIS — L97911 Non-pressure chronic ulcer of unspecified part of right lower leg limited to breakdown of skin: Secondary | ICD-10-CM | POA: Diagnosis not present

## 2018-11-19 DIAGNOSIS — S80822A Blister (nonthermal), left lower leg, initial encounter: Secondary | ICD-10-CM | POA: Diagnosis not present

## 2018-11-19 DIAGNOSIS — S80222A Blister (nonthermal), left knee, initial encounter: Secondary | ICD-10-CM | POA: Diagnosis not present

## 2018-11-19 DIAGNOSIS — S90522A Blister (nonthermal), left ankle, initial encounter: Secondary | ICD-10-CM | POA: Diagnosis not present

## 2018-11-19 DIAGNOSIS — L97322 Non-pressure chronic ulcer of left ankle with fat layer exposed: Secondary | ICD-10-CM | POA: Diagnosis not present

## 2018-11-19 NOTE — Patient Outreach (Signed)
[facility] [facility]) Care Management  11/19/2018  Shaun Yoder Nov 08, 1945 [id number]   THN screening call   Referral source: Insurance plan, Tier 5    Outreach call to patient no answer able to leave a HIPAA compliant message for return call.   Plan Will plan return call in the next 4 business days.  Will send unsuccessful outreach letter    Joylene Draft, RN, [place] Management Coordinator  [phone]- Mobile [phone]- [place]

## 2018-11-24 ENCOUNTER — Other Ambulatory Visit: Payer: Self-pay | Admitting: *Deleted

## 2018-11-24 NOTE — Patient Outreach (Signed)
[place] [facility]) Care Management  11/24/2018  Shaun Yoder 16-Mar-1946 [id number]   Telephone screening call #2  Referral source : Insurance Plan, Tier 5. Insurance: Scientist, water quality.    Outreach call to patient , person answering phone states that he is not available , request return call later .   1500  Returned call to patient, explained the reason for the call and [facility] care management services.   Patient discussed doing fairly well on today, about his usual with chronic arthritis problems.  Screening completed: Social Patient lives at home with his spouse, they live in a one level town house.  Patient reports having good support through the [place] with getting supplies, he has a specialized VAN,that his mobility wheelchair fits on that his wife drives. Patient has a specialized tub and his wife assist him with getting a shower about 3 times a week. Patient discussed having a bath aide arranged through the [place] up until a few months ago,   and hopeful for plan for her to return after virus restriction eased.  Patient denies having a recent fall.  Patient discussed having specialist, at the [place], , psychiatrist, dentist  and PCP and he follows up regularly has a visit within the next month. States he also see Dr.Paz locally as her prescribes tramadol for pain .  Conditions Patient discussed having problems with Arthritis, limiting mobility, using mobility chair reports not taking medication for arthritis due to it not helping  for  Patient reports having history of schizophrenia , depression and follows up at the [place] about every 3 months and taking medications as prescribed.  Patient discussed having hypertension but it is well controlled not on medication states it runs good at office visits.  Patient discussed having diagnosis of aortic stenosis, recent office visit at [facility] and patient states he doesn't plan to have andy surgery.  Patient discussed history of  problems with blister/ulcers  that develop on leg, currently left leg has an wound , he had recent visit at wound center in [place], and plans to attend again in 2 weeks ,his wife changes dressing every 2 days.   Medication No cost concerns gets filled at the [facility]    Consent  Explained and offered [facility] care management telephonic  services for education and management with chronic conditions,  patient declined need for service at this time, states between everything arranged through [place] he is managing alright at this time. Patient is interesting in receiving additional information about program.   Plan  Will send successful outreach letter .    Joylene Draft, RN, [place] Management Coordinator  [phone]- Mobile [phone]- Toll Free Main Office

## 2018-12-03 DIAGNOSIS — L97921 Non-pressure chronic ulcer of unspecified part of left lower leg limited to breakdown of skin: Secondary | ICD-10-CM | POA: Diagnosis not present

## 2018-12-03 DIAGNOSIS — I87312 Chronic venous hypertension (idiopathic) with ulcer of left lower extremity: Secondary | ICD-10-CM | POA: Diagnosis not present

## 2018-12-03 DIAGNOSIS — I890 Lymphedema, not elsewhere classified: Secondary | ICD-10-CM | POA: Diagnosis not present

## 2018-12-03 DIAGNOSIS — L97911 Non-pressure chronic ulcer of unspecified part of right lower leg limited to breakdown of skin: Secondary | ICD-10-CM | POA: Diagnosis not present

## 2018-12-03 DIAGNOSIS — I872 Venous insufficiency (chronic) (peripheral): Secondary | ICD-10-CM | POA: Diagnosis not present

## 2018-12-03 DIAGNOSIS — L97311 Non-pressure chronic ulcer of right ankle limited to breakdown of skin: Secondary | ICD-10-CM | POA: Diagnosis not present

## 2018-12-03 DIAGNOSIS — L97321 Non-pressure chronic ulcer of left ankle limited to breakdown of skin: Secondary | ICD-10-CM | POA: Diagnosis not present

## 2018-12-03 DIAGNOSIS — L97811 Non-pressure chronic ulcer of other part of right lower leg limited to breakdown of skin: Secondary | ICD-10-CM | POA: Diagnosis not present

## 2018-12-03 DIAGNOSIS — L97412 Non-pressure chronic ulcer of right heel and midfoot with fat layer exposed: Secondary | ICD-10-CM | POA: Diagnosis not present

## 2018-12-03 DIAGNOSIS — L97821 Non-pressure chronic ulcer of other part of left lower leg limited to breakdown of skin: Secondary | ICD-10-CM | POA: Diagnosis not present

## 2018-12-09 DIAGNOSIS — R3915 Urgency of urination: Secondary | ICD-10-CM | POA: Diagnosis not present

## 2018-12-09 DIAGNOSIS — N3941 Urge incontinence: Secondary | ICD-10-CM | POA: Diagnosis not present

## 2018-12-10 DIAGNOSIS — M79672 Pain in left foot: Secondary | ICD-10-CM | POA: Diagnosis not present

## 2018-12-10 DIAGNOSIS — M79671 Pain in right foot: Secondary | ICD-10-CM | POA: Diagnosis not present

## 2018-12-10 DIAGNOSIS — B351 Tinea unguium: Secondary | ICD-10-CM | POA: Diagnosis not present

## 2018-12-24 DIAGNOSIS — L97811 Non-pressure chronic ulcer of other part of right lower leg limited to breakdown of skin: Secondary | ICD-10-CM | POA: Diagnosis not present

## 2018-12-24 DIAGNOSIS — L97911 Non-pressure chronic ulcer of unspecified part of right lower leg limited to breakdown of skin: Secondary | ICD-10-CM | POA: Diagnosis not present

## 2018-12-24 DIAGNOSIS — L97821 Non-pressure chronic ulcer of other part of left lower leg limited to breakdown of skin: Secondary | ICD-10-CM | POA: Diagnosis not present

## 2018-12-24 DIAGNOSIS — I890 Lymphedema, not elsewhere classified: Secondary | ICD-10-CM | POA: Diagnosis not present

## 2018-12-24 DIAGNOSIS — L97921 Non-pressure chronic ulcer of unspecified part of left lower leg limited to breakdown of skin: Secondary | ICD-10-CM | POA: Diagnosis not present

## 2018-12-24 DIAGNOSIS — I872 Venous insufficiency (chronic) (peripheral): Secondary | ICD-10-CM | POA: Diagnosis not present

## 2019-01-04 ENCOUNTER — Telehealth: Payer: Self-pay | Admitting: Internal Medicine

## 2019-01-04 NOTE — Telephone Encounter (Signed)
Tramadol.   Last OV: 11/17/2018 Last Fill: 08/31/2018 #30 and 1RF UDS: 05/18/2018 Low risk

## 2019-01-05 NOTE — Telephone Encounter (Signed)
Sent!

## 2019-01-07 DIAGNOSIS — L97821 Non-pressure chronic ulcer of other part of left lower leg limited to breakdown of skin: Secondary | ICD-10-CM | POA: Diagnosis not present

## 2019-01-07 DIAGNOSIS — I872 Venous insufficiency (chronic) (peripheral): Secondary | ICD-10-CM | POA: Diagnosis not present

## 2019-01-07 DIAGNOSIS — L97222 Non-pressure chronic ulcer of left calf with fat layer exposed: Secondary | ICD-10-CM | POA: Diagnosis not present

## 2019-01-07 DIAGNOSIS — G894 Chronic pain syndrome: Secondary | ICD-10-CM | POA: Diagnosis not present

## 2019-01-07 DIAGNOSIS — L97811 Non-pressure chronic ulcer of other part of right lower leg limited to breakdown of skin: Secondary | ICD-10-CM | POA: Diagnosis not present

## 2019-01-07 DIAGNOSIS — L97822 Non-pressure chronic ulcer of other part of left lower leg with fat layer exposed: Secondary | ICD-10-CM | POA: Diagnosis not present

## 2019-01-07 DIAGNOSIS — L98491 Non-pressure chronic ulcer of skin of other sites limited to breakdown of skin: Secondary | ICD-10-CM | POA: Diagnosis not present

## 2019-01-07 DIAGNOSIS — I890 Lymphedema, not elsewhere classified: Secondary | ICD-10-CM | POA: Diagnosis not present

## 2019-01-07 DIAGNOSIS — M25552 Pain in left hip: Secondary | ICD-10-CM | POA: Diagnosis not present

## 2019-01-21 DIAGNOSIS — L97811 Non-pressure chronic ulcer of other part of right lower leg limited to breakdown of skin: Secondary | ICD-10-CM | POA: Diagnosis not present

## 2019-01-21 DIAGNOSIS — S80821A Blister (nonthermal), right lower leg, initial encounter: Secondary | ICD-10-CM | POA: Diagnosis not present

## 2019-01-21 DIAGNOSIS — I890 Lymphedema, not elsewhere classified: Secondary | ICD-10-CM | POA: Diagnosis not present

## 2019-01-21 DIAGNOSIS — L97912 Non-pressure chronic ulcer of unspecified part of right lower leg with fat layer exposed: Secondary | ICD-10-CM | POA: Diagnosis not present

## 2019-01-21 DIAGNOSIS — I872 Venous insufficiency (chronic) (peripheral): Secondary | ICD-10-CM | POA: Diagnosis not present

## 2019-01-27 ENCOUNTER — Ambulatory Visit (INDEPENDENT_AMBULATORY_CARE_PROVIDER_SITE_OTHER): Payer: Medicare Other | Admitting: Internal Medicine

## 2019-01-27 ENCOUNTER — Other Ambulatory Visit: Payer: Self-pay

## 2019-01-27 DIAGNOSIS — M1612 Unilateral primary osteoarthritis, left hip: Secondary | ICD-10-CM | POA: Diagnosis not present

## 2019-01-27 NOTE — Progress Notes (Signed)
Subjective:    Patient ID: Shaun Yoder, male    DOB: March 12, 1946, 73 y.o.   MRN: [id number]  DOS:  01/27/2019 Type of visit - description: Virtual Visit via Video Note  I connected with@ on 01/29/19 at  3:20 PM EDT by a video enabled telemedicine application and verified that I am speaking with the correct person using two identifiers.   THIS ENCOUNTER IS A VIRTUAL VISIT DUE TO COVID-19 - PATIENT WAS NOT SEEN IN THE OFFICE. PATIENT HAS CONSENTED TO VIRTUAL VISIT / TELEMEDICINE VISIT   Location of patient: home  Location of provider: office  I discussed the limitations of evaluation and management by telemedicine and the availability of in person appointments. The patient expressed understanding and agreed to proceed.  History of Present Illness: Acute  The patient is dealing with left hip pain, was seen at the orthopedic office, after x-rays and apparently MRI he was diagnosed with advanced DJD, not amenable to any intervention except pain management.  Was recommended to be refer to a specialist by me. Additionally, he is concerned about a clot on the left leg, because he has pain.  He denies to me any unusual swelling.  Other than above he is at baseline. We reviewed together his medication list, good compliance.     Review of Systems Denies fever chills, skin issues follow-up at the wound care center.   Past Medical History:  Diagnosis Date  . Anemia   . Cancer ([place])    bladder  . Chronic venous insufficiency   . Colitis   . Colon polyps   . Depression   . Diverticulosis   . DVT of deep femoral vein ([place])   . Gastritis   . GERD (gastroesophageal reflux disease)   . History of rectal polyps   . Hyperlipidemia   . Hypertension   . Iron deficiency   . Low back pain   . Lymphedema of both lower extremities   . OA (osteoarthritis)   . Osteonecrosis of shoulder region ([place])   . Schizoaffective disorder, bipolar type ([place])   . Sleep apnea   . Varicose vein     Past Surgical History:  Procedure Laterality Date  . BLADDER SURGERY    . TOTAL KNEE ARTHROPLASTY Bilateral   . VARICOSE VEIN SURGERY      Social History   Socioeconomic History  . Marital status: Married    Spouse name: Not on file  . Number of children: 2  . Years of education: Not on file  . Highest education level: Not on file  Occupational History  . Occupation: retired  Scientific laboratory technician  . Financial resource strain: Not on file  . Food insecurity    Worry: Not on file    Inability: Not on file  . Transportation needs    Medical: Not on file    Non-medical: Not on file  Tobacco Use  . Smoking status: Former Smoker    Packs/day: 2.50    Years: 43.00    Pack years: 107.50    Types: Cigarettes    Quit date: 07/22/1998    Years since quitting: 20.5  . Smokeless tobacco: Never Used  Substance and Sexual Activity  . Alcohol use: Not Currently    Alcohol/week: 0.0 standard drinks  . Drug use: No  . Sexual activity: Not Currently  Lifestyle  . Physical activity    Days per week: Not on file    Minutes per session: Not on file  . Stress:  Not on file  Relationships  . Social Herbalist on phone: Not on file    Gets together: Not on file    Attends religious service: Not on file    Active member of club or organization: Not on file    Attends meetings of clubs or organizations: Not on file    Relationship status: Not on file  . Intimate partner violence    Fear of current or ex partner: Not on file    Emotionally abused: Not on file    Physically abused: Not on file    Forced sexual activity: Not on file  Other Topics Concern  . Not on file  Social History Narrative   Originally from [place]. Moved to [facility] in July 22, 1985. 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 No known asbestos exposure. 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 No international travel. Has a dog currently. Remote exposure to Cockatiel in a different home. No mold exposure. No hot tub exposure.       Two adopted children      Allergies as of 01/27/2019      Reactions   Leflunomide Other (See Comments)   diarrhea   Ancef [cefazolin]    Rash   Morphine And Related Nausea And Vomiting   Plaquenil [hydroxychloroquine Sulfate]    rash      Medication List       Accurate as of January 27, 2019 11:59 PM. If you have any questions, ask your nurse or doctor.        ARIPiprazole 30 MG tablet Commonly known as: ABILIFY Take 30 mg by mouth at bedtime.   aspirin EC 81 MG tablet Take 81 mg by mouth daily.   augmented betamethasone dipropionate 0.05 % cream Commonly known as: DIPROLENE-AF Apply topically 2 (two) times daily. Apply to the rash of the right side of the face   Calcium 500/D 500-200 MG-UNIT tablet Generic drug: calcium-vitamin D Take 1 tablet by mouth daily with breakfast.   divalproex 250 MG DR tablet Commonly known as: DEPAKOTE Take 250 mg by mouth daily with lunch.   divalproex 500 MG 24 hr tablet Commonly known as: DEPAKOTE ER Take 1,500 mg by mouth daily. 3 tabs at bedtime   Eliquis 5 MG Tabs tablet Generic drug: apixaban Take 2.5 mg by mouth 2 (two) times daily.   folic acid 1 MG tablet Commonly known as: FOLVITE Take 1 mg by mouth daily.   ketoconazole 2 % cream Commonly known as: NIZORAL Apply 1 application topically daily.   ketoconazole 2 % shampoo Commonly known as: NIZORAL LATHER AND MASSAGE INTO SCALP 2-3 TIMES PER WEEK. LEAVE FOR 5 MINUTES BEFORE RINSING.   Melatonin 3 MG Tabs Take 3 mg by mouth at bedtime.   mirabegron ER 50 MG Tb24 tablet Commonly known as: MYRBETRIQ Take 50 mg by mouth daily.   Omega-3 1000 MG Caps Take 1 capsule by mouth daily.   omeprazole 20 MG capsule Commonly known as: PRILOSEC Take 20 mg by mouth every morning.   oxybutynin 5 MG tablet  Commonly known as: DITROPAN Take 5 mg by mouth 2 (two) times daily.   predniSONE 5 MG tablet Commonly known as: DELTASONE Take 5 mg by mouth daily with breakfast.   traMADol 50 MG tablet Commonly known as: ULTRAM TAKE  1 TABLET (50 MG TOTAL) BY MOUTH EVERY 6 (SIX) HOURS AS NEEDED.   vitamin C 500 MG tablet Commonly known as: ASCORBIC ACID Take 500 mg by mouth daily.   ziprasidone 80 MG capsule Commonly known as: GEODON Take 80 mg by mouth daily.   ziprasidone 20 MG capsule Commonly known as: GEODON Take 20 mg by mouth daily at 12 noon.           Objective:   Physical Exam There were no vitals taken for this visit. Is a virtual video visit.  The patient is alert oriented x3, no apparent distress.  He seems in good spirits    Assessment     Assessment   ( transfer from [facility],02-2015) Hypertension Hyperlipidemia Ao stenosis, severe: Dr Burt Knack MSK: --Seronegative Rheumatoid arthritis-- Dr Lenna [place]  --DJD-- DR Tamera Punt , dr Ronnie Derby --low back pain d/t DJD, Dr Sherlyn Lick  PSYCH: Schizophrenia, bipolar, depression: Follow-up at the [facility] in [facility] GU: Bladder cancer-transitional cell, Dr. Shona Needles GI:   GERD, history of colitis, history of gastritis,  colon polyps- had a virtual cscope 2016, + polyps, declined further eval Pulmomany: Dr Ashok Cordia --Sleep apnea:  Cpap intolerant --CT nodules f/u 2 pulmonary  --hypoxemia, dx 01-2017, unable to get O2 from medicare, eventually got O2 home-portable ~08-2017; off O2 by ~ 05/2018 Hematology: L LEG DVT 02-2016 + Lupus anticoagulant Varicose veins: s/p remote surgery, has LE wounds  [facility], Ortho, pain mngmt   PLAN   DJD: Advanced DJD left hip per patient, was seen by Dr Antonietta Jewel , needs pain management, will arrange a referral.  Until then continue taking tramadol every 6 hours.  That is barely controlling his pain. Other problems seem to be stable and managed elsewhere.   I  discussed the assessment and treatment plan with the patient. The patient was provided an opportunity to ask questions and all were answered. The patient agreed with the plan and demonstrated an understanding of the instructions.   The patient was advised to call back or seek an in-person evaluation if the symptoms worsen or if the condition fails to improve as anticipated.

## 2019-01-29 NOTE — Assessment & Plan Note (Signed)
DJD: Advanced DJD left hip per patient, was seen by Dr Antonietta Jewel , needs pain management, will arrange a referral.  Until then continue taking tramadol every 6 hours.  That is barely controlling his pain. Other problems seem to be stable and managed elsewhere.

## 2019-02-01 DIAGNOSIS — L97921 Non-pressure chronic ulcer of unspecified part of left lower leg limited to breakdown of skin: Secondary | ICD-10-CM | POA: Diagnosis not present

## 2019-02-01 DIAGNOSIS — I872 Venous insufficiency (chronic) (peripheral): Secondary | ICD-10-CM | POA: Diagnosis not present

## 2019-02-01 DIAGNOSIS — L97812 Non-pressure chronic ulcer of other part of right lower leg with fat layer exposed: Secondary | ICD-10-CM | POA: Diagnosis not present

## 2019-02-01 DIAGNOSIS — L97312 Non-pressure chronic ulcer of right ankle with fat layer exposed: Secondary | ICD-10-CM | POA: Diagnosis not present

## 2019-02-01 DIAGNOSIS — I890 Lymphedema, not elsewhere classified: Secondary | ICD-10-CM | POA: Diagnosis not present

## 2019-02-01 DIAGNOSIS — L97911 Non-pressure chronic ulcer of unspecified part of right lower leg limited to breakdown of skin: Secondary | ICD-10-CM | POA: Diagnosis not present

## 2019-02-01 DIAGNOSIS — L97822 Non-pressure chronic ulcer of other part of left lower leg with fat layer exposed: Secondary | ICD-10-CM | POA: Diagnosis not present

## 2019-02-15 DIAGNOSIS — I872 Venous insufficiency (chronic) (peripheral): Secondary | ICD-10-CM | POA: Diagnosis not present

## 2019-02-15 DIAGNOSIS — I890 Lymphedema, not elsewhere classified: Secondary | ICD-10-CM | POA: Diagnosis not present

## 2019-02-15 DIAGNOSIS — L97921 Non-pressure chronic ulcer of unspecified part of left lower leg limited to breakdown of skin: Secondary | ICD-10-CM | POA: Diagnosis not present

## 2019-02-15 DIAGNOSIS — L97911 Non-pressure chronic ulcer of unspecified part of right lower leg limited to breakdown of skin: Secondary | ICD-10-CM | POA: Diagnosis not present

## 2019-02-17 ENCOUNTER — Ambulatory Visit (INDEPENDENT_AMBULATORY_CARE_PROVIDER_SITE_OTHER): Payer: Medicare Other | Admitting: Internal Medicine

## 2019-02-17 ENCOUNTER — Encounter: Payer: Self-pay | Admitting: Internal Medicine

## 2019-02-17 ENCOUNTER — Other Ambulatory Visit: Payer: Self-pay

## 2019-02-17 VITALS — BP 109/72 | HR 85 | Temp 99.1°F | Resp 16 | Ht 61.0 in | Wt 210.0 lb

## 2019-02-17 DIAGNOSIS — E785 Hyperlipidemia, unspecified: Secondary | ICD-10-CM

## 2019-02-17 DIAGNOSIS — M159 Polyosteoarthritis, unspecified: Secondary | ICD-10-CM

## 2019-02-17 DIAGNOSIS — M150 Primary generalized (osteo)arthritis: Secondary | ICD-10-CM | POA: Diagnosis not present

## 2019-02-17 DIAGNOSIS — I350 Nonrheumatic aortic (valve) stenosis: Secondary | ICD-10-CM

## 2019-02-17 DIAGNOSIS — I10 Essential (primary) hypertension: Secondary | ICD-10-CM

## 2019-02-17 MED ORDER — TRAMADOL HCL 50 MG PO TABS: 50.0000 mg | ORAL_TABLET | Freq: Four times a day (QID) | ORAL | 1 refills | Status: DC | PRN

## 2019-02-17 NOTE — Progress Notes (Signed)
Pre visit review using our clinic review tool, if applicable. No additional management support is needed unless otherwise documented below in the visit note. 

## 2019-02-17 NOTE — Assessment & Plan Note (Addendum)
Has a PCP @ the [facility].  We agreed w/ patient that the [facility] will be tracking screening, shots, etc

## 2019-02-17 NOTE — Progress Notes (Signed)
Subjective:    Patient ID: Shaun Yoder, male    DOB: February 26, 1946, 73 y.o.   MRN: [id number]  DOS:  02/17/2019 Type of visit - description: Routine follow-up No major concerns Pain management: Request a refill on tramadol COPD: Reports this is stable, he canceled the appointment he had with a pulmonologist at the [facility] per patient. Complaining of pain and feeling of wax at the R ear.  No discharge.    Review of Systems No fever chills No chest pain.  Shortness of breath at baseline No nausea, vomiting, diarrhea  Past Medical History:  Diagnosis Date  . Anemia   . Cancer ([place])    bladder  . Chronic venous insufficiency   . Colitis   . Colon polyps   . Depression   . Diverticulosis   . DVT of deep femoral vein ([place])   . Gastritis   . GERD (gastroesophageal reflux disease)   . History of rectal polyps   . Hyperlipidemia   . Hypertension   . Iron deficiency   . Low back pain   . Lymphedema of both lower extremities   . OA (osteoarthritis)   . Osteonecrosis of shoulder region ([place])   . Schizoaffective disorder, bipolar type ([place])   . Sleep apnea   . Varicose vein     Past Surgical History:  Procedure Laterality Date  . BLADDER SURGERY    . TOTAL KNEE ARTHROPLASTY Bilateral   . VARICOSE VEIN SURGERY      Social History   Socioeconomic History  . Marital status: Married    Spouse name: Not on file  . Number of children: 2  . Years of education: Not on file  . Highest education level: Not on file  Occupational History  . Occupation: retired  Scientific laboratory technician  . Financial resource strain: Not on file  . Food insecurity    Worry: Not on file    Inability: Not on file  . Transportation needs    Medical: Not on file    Non-medical: Not on file  Tobacco Use  . Smoking status: Former Smoker    Packs/day: 2.50    Years: 43.00    Pack years: 107.50    Types: Cigarettes    Quit date: 07/22/1998    Years since quitting: 20.5  . Smokeless tobacco: Never Used   Substance and Sexual Activity  . Alcohol use: Not Currently    Alcohol/week: 0.0 standard drinks  . Drug use: No  . Sexual activity: Not Currently  Lifestyle  . Physical activity    Days per week: Not on file    Minutes per session: Not on file  . Stress: Not on file  Relationships  . Social Herbalist on phone: Not on file    Gets together: Not on file    Attends religious service: Not on file    Active member of club or organization: Not on file    Attends meetings of clubs or organizations: Not on file    Relationship status: Not on file  . Intimate partner violence    Fear of current or ex partner: Not on file    Emotionally abused: Not on file    Physically abused: Not on file    Forced sexual activity: Not on file  Other Topics Concern  . Not on file  Social History Narrative   Originally from [place]. Moved to [facility] in July 22, 1985. 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 No known asbestos exposure. 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 No international travel. Has a dog currently. Remote exposure to Cockatiel in a different home. No mold exposure. No hot tub exposure.       Two adopted children      Allergies as of 02/17/2019      Reactions   Leflunomide Other (See Comments)   diarrhea   Ancef [cefazolin]    Rash   Morphine And Related Nausea And Vomiting   Plaquenil [hydroxychloroquine Sulfate]    rash      Medication List       Accurate as of February 17, 2019  2:16 PM. If you have any questions, ask your nurse or doctor.        ARIPiprazole 30 MG tablet Commonly known as: ABILIFY Take 30 mg by mouth at bedtime.   aspirin EC 81 MG tablet Take 81 mg by mouth daily.   augmented betamethasone dipropionate 0.05 % cream Commonly known as: DIPROLENE-AF Apply topically 2 (two) times daily.  Apply to the rash of the right side of the face   Calcium 500/D 500-200 MG-UNIT tablet Generic drug: calcium-vitamin D Take 1 tablet by mouth daily with breakfast.   clotrimazole 1 % cream Commonly known as: LOTRIMIN Apply 1 application topically daily as needed.   divalproex 250 MG DR tablet Commonly known as: DEPAKOTE Take 250 mg by mouth daily with lunch.   divalproex 500 MG 24 hr tablet Commonly known as: DEPAKOTE ER Take 1,500 mg by mouth daily. 3 tabs at bedtime   Eliquis 5 MG Tabs tablet Generic drug: apixaban Take 2.5 mg by mouth 2 (two) times daily.   folic acid 1 MG tablet Commonly known as: FOLVITE Take 1 mg by mouth daily.   ketoconazole 2 % cream Commonly known as: NIZORAL Apply 1 application topically daily.   ketoconazole 2 % shampoo Commonly known as: NIZORAL LATHER AND MASSAGE INTO SCALP 2-3 TIMES PER WEEK. LEAVE FOR 5 MINUTES BEFORE RINSING.   Melatonin 3 MG Tabs Take 3 mg by mouth at bedtime.   mirabegron ER 50 MG Tb24 tablet Commonly known as: MYRBETRIQ Take 50 mg by mouth daily.   Omega-3 1000 MG Caps Take 1 capsule by mouth daily.   omeprazole 20 MG capsule Commonly known as: PRILOSEC Take 20 mg by mouth every morning.   oxybutynin 5 MG tablet Commonly known as: DITROPAN Take 5 mg by mouth 3 (three) times daily.   predniSONE 5 MG tablet Commonly known as: DELTASONE Take 5 mg by mouth daily with breakfast.   traMADol 50 MG tablet Commonly known as: ULTRAM TAKE 1 TABLET (50 MG TOTAL) BY MOUTH EVERY 6 (SIX) HOURS AS NEEDED.   triamcinolone cream 0.1 % Commonly known as: KENALOG Apply 1 application topically 2 (two) times daily.   vitamin C 500 MG tablet Commonly known as: ASCORBIC ACID Take 500 mg by mouth daily.   ziprasidone 80 MG capsule Commonly known as: GEODON Take 80 mg by mouth daily.   ziprasidone 20 MG capsule Commonly known as: GEODON Take 20 mg by mouth daily at 12 noon.           Objective:   Physical  Exam BP 109/72 (BP Location: Left Arm, Patient Position: Sitting, Cuff Size: Small)   Pulse 85   Temp 99.1 F (37.3 C) (Oral)  Resp 16   Ht 5' 1"  (1.549 m)   Wt 210 lb (95.3 kg)   SpO2 93%   BMI 39.68 kg/m  General:   Well developed, NAD, BMI noted. HEENT:  Normocephalic . Face symmetric, atraumatic. Ear exam: Normal bilaterally Lungs:  Decreased breath sounds particularly at bases Normal respiratory effort, no intercostal retractions, no accessory muscle use. Heart: Regular, significant systolic murmur .   Skin: Pretibial area is wrapped B Neurologic:  alert & oriented X3.  Speech normal, gait not tested Psych--  Cognition and judgment appear intact.  Cooperative with normal attention span and concentration.  Behavior appropriate. No anxious or depressed appearing.      Assessment     Assessment   ( transfer from [facility],02-2015) Hypertension Hyperlipidemia Ao stenosis, severe ([place]lena Heckle [place] MSK: --Seronegative Rheumatoid arthritis  --DJD-- DR Tamera Punt , dr Ronnie Derby --low back pain d/t DJD, Dr Sherlyn Lick  PSYCH: Schizophrenia, bipolar, depression: Follow-up at the [facility] in [place] GU: Bladder cancer-transitional cell, Dr. Shona Needles GI:   GERD, history of colitis, history of gastritis,  colon polyps- had a virtual cscope 2016, + polyps, declined further eval Pulmomany:   --Sleep apnea:  Cpap intolerant --CT nodules f/u 2 pulmonary  --hypoxemia, dx 01-2017, unable to get O2 from medicare, eventually got O2 home-portable ~08-2017; off O2 by ~ 05/2018 Hematology: L LEG DVT 02-2016 + Lupus anticoagulant Varicose veins: s/p remote surgery, has LE wounds  H/o  iron deficiency anemia [facility], psych, Ortho, pain mngmt   PLAN   HTN: BP today is very good, he is really not taking any BP medications.  Request blood work.  We will get a CMP and CBC. High cholesterol: History of, not on medication, check a FLP. Aortic stenosis: Saw Dr. Quillian Quince   09-2018, they are planning echocardiogram soon.  The patient has no symptoms (noting that he is wheelchair-bound). Schizophrenia, bipolar, depression: Followed at the [facility] in [place], [place] 9 today 14.  He seems well today. DJD: Refill tramadol.   Pulmonology: Patient cancel the appointment he had today at the [facility]. Ear pain?:  Exam is negative.  Recommend observation. Preventive care: Patient assures me that his primary care needs are met at the [facility].  I did recommend him to get a flu shot. RTC 6 months

## 2019-02-17 NOTE — Patient Instructions (Addendum)
GO TO THE LAB : Get the blood work    GO TO THE FRONT DESK Schedule your next appointment for a checkup in 6 months  Get flu shot this fall

## 2019-02-18 DIAGNOSIS — M79671 Pain in right foot: Secondary | ICD-10-CM | POA: Diagnosis not present

## 2019-02-18 DIAGNOSIS — M79672 Pain in left foot: Secondary | ICD-10-CM | POA: Diagnosis not present

## 2019-02-18 DIAGNOSIS — B351 Tinea unguium: Secondary | ICD-10-CM | POA: Diagnosis not present

## 2019-02-18 LAB — LIPID PANEL
Cholesterol: 126 mg/dL (ref 0–200)
HDL: 30.5 mg/dL — ABNORMAL LOW (ref >39.00)
LDL Cholesterol: 77 mg/dL (ref 0–99)
NonHDL: 95.99
Total CHOL/HDL Ratio: 4
Triglycerides: 94 mg/dL (ref 0.0–149.0)
VLDL: 18.8 mg/dL (ref 0.0–40.0)

## 2019-02-18 LAB — CBC WITH DIFFERENTIAL/PLATELET
Basophils Absolute: 0.1 10*3/uL (ref 0.0–0.1)
Basophils Relative: 0.7 % (ref 0.0–3.0)
Eosinophils Absolute: 0 10*3/uL (ref 0.0–0.7)
Eosinophils Relative: 0.3 % (ref 0.0–5.0)
HCT: 41.8 % (ref 39.0–52.0)
Hemoglobin: 13.9 g/dL (ref 13.0–17.0)
Lymphocytes Relative: 12.1 % (ref 12.0–46.0)
Lymphs Abs: 1 10*3/uL (ref 0.7–4.0)
MCHC: 33.4 g/dL (ref 30.0–36.0)
MCV: 94.4 fl (ref 78.0–100.0)
Monocytes Absolute: 0.5 10*3/uL (ref 0.1–1.0)
Monocytes Relative: 5.6 % (ref 3.0–12.0)
Neutro Abs: 6.9 10*3/uL (ref 1.4–7.7)
Neutrophils Relative %: 81.3 % — ABNORMAL HIGH (ref 43.0–77.0)
Platelets: 160 10*3/uL (ref 150.0–400.0)
RBC: 4.42 Mil/uL (ref 4.22–5.81)
RDW: 13.6 % (ref 11.5–15.5)
WBC: 8.4 10*3/uL (ref 4.0–10.5)

## 2019-02-18 LAB — COMPREHENSIVE METABOLIC PANEL
ALT: 12 U/L (ref 0–53)
AST: 20 U/L (ref 0–37)
Albumin: 3.4 g/dL — ABNORMAL LOW (ref 3.5–5.2)
Alkaline Phosphatase: 66 U/L (ref 39–117)
BUN: 25 mg/dL — ABNORMAL HIGH (ref 6–23)
CO2: 29 mEq/L (ref 19–32)
Calcium: 8.7 mg/dL (ref 8.4–10.5)
Chloride: 102 mEq/L (ref 96–112)
Creatinine, Ser: 0.59 mg/dL (ref 0.40–1.50)
GFR: 134.76 mL/min (ref >60.00)
Glucose, Bld: 127 mg/dL — ABNORMAL HIGH (ref 70–99)
Potassium: 4.2 mEq/L (ref 3.5–5.1)
Sodium: 138 mEq/L (ref 135–145)
Total Bilirubin: 0.7 mg/dL (ref 0.2–1.2)
Total Protein: 6.2 g/dL (ref 6.0–8.3)

## 2019-02-18 NOTE — Assessment & Plan Note (Signed)
HTN: BP today is very good, he is really not taking any BP medications.  Request blood work.  We will get a CMP and CBC. High cholesterol: History of, not on medication, check a FLP. Aortic stenosis: Saw Dr. Quillian Quince  09-2018, they are planning echocardiogram soon.  The patient has no symptoms (noting that he is wheelchair-bound). Schizophrenia, bipolar, depression: Followed at the [facility] in [place], [place] 9 today 14.  He seems well today. DJD: Refill tramadol.   Pulmonology: Patient cancel the appointment he had today at the [facility]. Ear pain?:  Exam is negative.  Recommend observation. Preventive care: Patient assures me that his primary care needs are met at the [facility].  I did recommend him to get a flu shot. RTC 6 months

## 2019-02-23 DIAGNOSIS — R52 Pain, unspecified: Secondary | ICD-10-CM | POA: Diagnosis not present

## 2019-02-23 DIAGNOSIS — M25559 Pain in unspecified hip: Secondary | ICD-10-CM | POA: Diagnosis not present

## 2019-02-23 DIAGNOSIS — G894 Chronic pain syndrome: Secondary | ICD-10-CM | POA: Diagnosis not present

## 2019-02-23 DIAGNOSIS — F259 Schizoaffective disorder, unspecified: Secondary | ICD-10-CM | POA: Diagnosis not present

## 2019-02-24 ENCOUNTER — Ambulatory Visit (INDEPENDENT_AMBULATORY_CARE_PROVIDER_SITE_OTHER): Payer: Medicare Other | Admitting: Internal Medicine

## 2019-02-24 ENCOUNTER — Other Ambulatory Visit: Payer: Self-pay

## 2019-02-24 ENCOUNTER — Encounter: Payer: Self-pay | Admitting: Internal Medicine

## 2019-02-24 VITALS — BP 105/77 | HR 71 | Temp 98.9°F | Resp 18 | Ht 61.0 in | Wt 210.0 lb

## 2019-02-24 DIAGNOSIS — K1121 Acute sialoadenitis: Secondary | ICD-10-CM

## 2019-02-24 MED ORDER — AMOXICILLIN-POT CLAVULANATE 875-125 MG PO TABS: 1.0000 | ORAL_TABLET | Freq: Two times a day (BID) | ORAL | 0 refills | Status: DC

## 2019-02-24 NOTE — Progress Notes (Signed)
Pre visit review using our clinic review tool, if applicable. No additional management support is needed unless otherwise documented below in the visit note. 

## 2019-02-24 NOTE — Progress Notes (Signed)
Subjective:    Patient ID: Shaun Yoder, male    DOB: 1945-08-10, 73 y.o.   MRN: [id number]  DOS:  02/24/2019 Type of visit - description: Acute, here with his wife Yesterday, developed acutely pain at the right side of the face and he felt the areas was TTP.  Review of Systems No fever chills No abdominal pain. No rash anywhere.   Past Medical History:  Diagnosis Date   Anemia    Cancer ([place])    bladder   Chronic venous insufficiency    Colitis    Colon polyps    Depression    Diverticulosis    DVT of deep femoral vein (HCC)    Gastritis    GERD (gastroesophageal reflux disease)    History of rectal polyps    Hyperlipidemia    Hypertension    Iron deficiency    Low back pain    Lymphedema of both lower extremities    OA (osteoarthritis)    Osteonecrosis of shoulder region ([place])    Schizoaffective disorder, bipolar type ([place])    Sleep apnea    Varicose vein     Past Surgical History:  Procedure Laterality Date   BLADDER SURGERY     TOTAL KNEE ARTHROPLASTY Bilateral    VARICOSE VEIN SURGERY      Social History   Socioeconomic History   Marital status: Married    Spouse name: Not on file   Number of children: 2   Years of education: Not on file   Highest education level: Not on file  Occupational History   Occupation: retired  Scientist, product/process development strain: Not on file   Food insecurity    Worry: Not on file    Inability: Not on Lexicographer needs    Medical: Not on file    Non-medical: Not on file  Tobacco Use   Smoking status: Former Smoker    Packs/day: 2.50    Years: 43.00    Pack years: 107.50    Types: Cigarettes    Quit date: 07/22/1998    Years since quitting: 20.6   Smokeless tobacco: Never Used  Substance and Sexual Activity   Alcohol use: Not Currently    Alcohol/week: 0.0 standard drinks   Drug use: No   Sexual activity: Not Currently  Lifestyle   Physical activity      Days per week: Not on file    Minutes per session: Not on file   Stress: Not on file  Relationships   Social connections    Talks on phone: Not on file    Gets together: Not on file    Attends religious service: Not on file    Active member of club or organization: Not on file    Attends meetings of clubs or organizations: Not on file    Relationship status: Not on file   Intimate partner violence    Fear of current or ex partner: Not on file    Emotionally abused: Not on file    Physically abused: Not on file    Forced sexual activity: Not on file  Other Topics Concern   Not on file  Social History Narrative   Originally from [place]. Moved to [facility] in July 22, 1985. 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 No known asbestos exposure. 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 No international travel. Has a dog currently. Remote exposure to Cockatiel in a different home. No mold exposure. No hot tub exposure.       Two adopted children      Allergies as of 02/24/2019      Reactions   Leflunomide Other (See Comments)   diarrhea   Ancef [cefazolin]    Rash   Morphine And Related Nausea And Vomiting   Plaquenil [hydroxychloroquine Sulfate]    rash      Medication List       Accurate as of February 24, 2019 11:59 PM. If you have any questions, ask your nurse or doctor.        amoxicillin-clavulanate 875-125 MG tablet Commonly known as: Augmentin Take 1 tablet by mouth 2 (two) times daily. Started by: Kathlene November, MD   ARIPiprazole 30 MG tablet Commonly known as: ABILIFY Take 30 mg by mouth at bedtime.   aspirin EC 81 MG tablet Take 81 mg by mouth daily.   augmented betamethasone dipropionate 0.05 % cream Commonly known as: DIPROLENE-AF Apply topically 2 (two) times daily. Apply to the rash of the right side of the  face   Calcium 500/D 500-200 MG-UNIT tablet Generic drug: calcium-vitamin D Take 1 tablet by mouth daily with breakfast.   clotrimazole 1 % cream Commonly known as: LOTRIMIN Apply 1 application topically daily as needed.   divalproex 250 MG DR tablet Commonly known as: DEPAKOTE Take 250 mg by mouth daily with lunch.   divalproex 500 MG 24 hr tablet Commonly known as: DEPAKOTE ER Take 1,500 mg by mouth daily. 3 tabs at bedtime   Eliquis 5 MG Tabs tablet Generic drug: apixaban Take 2.5 mg by mouth 2 (two) times daily.   folic acid 1 MG tablet Commonly known as: FOLVITE Take 1 mg by mouth daily.   ketoconazole 2 % cream Commonly known as: NIZORAL Apply 1 application topically daily.   ketoconazole 2 % shampoo Commonly known as: NIZORAL LATHER AND MASSAGE INTO SCALP 2-3 TIMES PER WEEK. LEAVE FOR 5 MINUTES BEFORE RINSING.   Melatonin 3 MG Tabs Take 3 mg by mouth at bedtime.   mirabegron ER 50 MG Tb24 tablet Commonly known as: MYRBETRIQ Take 50 mg by mouth daily.   Omega-3 1000 MG Caps Take 1 capsule by mouth daily.   omeprazole 20 MG capsule Commonly known as: PRILOSEC Take 20 mg by mouth every morning.   oxybutynin 5 MG tablet Commonly known as: DITROPAN Take 5 mg by mouth 3 (three) times daily.   predniSONE 5 MG tablet Commonly known as: DELTASONE Take 5 mg by mouth daily with breakfast.   traMADol 50 MG tablet Commonly known as: ULTRAM Take 1 tablet (50 mg total) by mouth every 6 (six) hours as needed.   triamcinolone cream 0.1 % Commonly known as: KENALOG Apply 1 application topically 2 (two) times daily.   vitamin C 500 MG tablet Commonly known as: ASCORBIC ACID Take 500 mg by mouth daily.   ziprasidone 80 MG capsule Commonly known as: GEODON Take 80 mg by mouth daily.   ziprasidone 20 MG capsule Commonly known as: GEODON Take 20 mg by mouth daily at 12 noon.           Objective:   Physical Exam HENT:     Head:     BP 105/77 (BP  Location: Left Arm, Patient Position: Sitting, Cuff Size: Small)    Pulse 71  Temp 98.9 F (37.2 C) (Oral)    Resp 18    Ht 5\' 1"  (1.549 m)    Wt 210 lb (95.3 kg)    SpO2 91%    BMI 39.68 kg/m  General:   Well developed, NAD, BMI noted. HEENT:  Normocephalic . Face   atraumatic Ears: TMs are canals are normal Mouth: Dry membranes; no gum lesions.   Upper gums not seen, patient using a denture. No lymphadenopathies. See graphic;  left side of the face normal  Skin: No rash at the head or neck Neurologic:  alert & oriented X3.  Psych--  Cognition and judgment appear intact.  Cooperative with normal attention span and concentration.  Behavior appropriate. No anxious or depressed appearing.      Assessment      Assessment   ( transfer from [facility],02-2015) Hypertension Hyperlipidemia Ao stenosis, severe ([place]lena Heckle [place] MSK: --Seronegative Rheumatoid arthritis  --DJD-- DR Tamera Punt , dr Ronnie Derby --low back pain d/t DJD, Dr Sherlyn Lick  PSYCH: Schizophrenia, bipolar, depression: Follow-up at the [facility] in [place] GU: Bladder cancer-transitional cell, Dr. Shona Needles GI:   GERD, history of colitis, history of gastritis,  colon polyps- had a virtual cscope 2016, + polyps, declined further eval Pulmomany:   --Sleep apnea:  Cpap intolerant --CT nodules f/u 2 pulmonary  --hypoxemia, dx 01-2017, unable to get O2 from medicare, eventually got O2 home-portable ~08-2017; off O2 by ~ 05/2018 Hematology: L LEG DVT 02-2016 + Lupus anticoagulant Varicose veins: s/p remote surgery, has LE wounds  H/o  iron deficiency anemia At the [facility]: sees a dentist, PCP, psych, Ortho, pain mngmt   PLAN   Acute parotitis: That is the likely diagnosis on clinical grounds. Recommend amoxicillin (allergic to cephalosporin but tolerates PCNs) Good hydration, stimulating gland with lime or lemon, warm compress, call if not better.  See AVS

## 2019-02-24 NOTE — Patient Instructions (Signed)
Take antibiotic as prescribed from 1 week  Use a warm compress over the area of inflammation twice a day  Suck on a lime or lemon 3 times a day for few days  Call if not gradually better  Call anytime if you get much worse.  Drink plenty of fluids   Parotitis  Parotitis means that you have irritation and swelling (inflammation) in one or both of your parotid glands. These glands make saliva. They are found on each side of your face, below and in front of your earlobes. You may or may not have pain with this condition. What are the causes? This condition may be caused by:  Infections from germs (bacteria or viruses).  Something blocking the flow of saliva through the parotid glands. This can be a stone, scar tissue, or a tumor.  Diseases that cause your body's defense system (immune system) to attack healthy cells in your salivary glands. These are called autoimmune diseases. What increases the risk? You are more likely to get this condition if:  You are 27 years old or older.  You do not drink enough fluids (are dehydrated).  You drink too much alcohol.  You have: ? A dry mouth. ? Diabetes. ? Gout. ? A long-term illness.  You do not take good care of your mouth and teeth (poor dental hygiene).  You have had radiation treatments to the head and neck.  You take certain medicines. What are the signs or symptoms? Symptoms of this condition depend on the cause. They may include:  Swelling under and in front of the ear. This may get worse after you eat.  Redness of the skin over the parotid gland.  Pain and tenderness over the parotid gland. This may get worse after you eat.  Fever or chills.  Pus coming from the ducts inside the mouth.  Dry mouth.  A bad taste in the mouth. How is this treated? Treatment for this condition depends on the cause. Treatment may include:  Antibiotic medicine for an infection from bacteria.  Drinking more fluids.  Removing a  stone or obstruction.  Treating a disease that is causing parotitis.  Surgery to drain an infection, remove a growth, or remove the whole gland. Treatment may not be needed if the swelling goes away with home care. Follow these instructions at home: Medicines   Take over-the-counter and prescription medicines only as told by your doctor.  If you were prescribed an antibiotic medicine, take it as told by your doctor. Do not stop taking the antibiotic even if you start to feel better. Managing pain and swelling  If told, put heat on the affected area. Do this as often as told by your doctor. Use the heat source that your doctor recommends, such as a moist heat pack or a heating pad. ? Place a towel between your skin and the heat source. ? Leave the heat on for 20-30 minutes. ? Remove the heat if your skin turns bright red. This is very important if you are unable to feel pain, heat, or cold. You may have a greater risk of getting burned.  Gargle with salt water 3-4 times a day or as needed. To make salt water, dissolve -1 tsp (3-6 g) of salt in 1 cup (237 mL) of warm water.  Gently rub your parotid glands as told by your doctor. General instructions   Drink enough fluid to keep your pee (urine) pale yellow.  Keep your mouth clean and moist.  Suck  on sour candy. This may help to: ? Make your mouth less dry. ? Make more saliva.  Take good care of your mouth: ? Brush your teeth at least two times a day. ? Floss your teeth every day. ? See your dentist regularly.  Do not use any products that contain nicotine or tobacco. These include cigarettes, e-cigarettes, and chewing tobacco. If you need help quitting, ask your doctor.  Do not drink alcohol.  Keep all follow-up visits as told by your doctor. This is important. Contact a doctor if:  You have a fever or chills.  You have new symptoms.  Your symptoms get worse.  Your symptoms do not get better with treatment. Get  help right away if:  You have trouble breathing or swallowing. Summary  Parotitis means that you have irritation and swelling (inflammation) in one or both of your parotid glands.  Symptoms include pain and swelling under and in front of the ear.  Treatment for parotitis depends on the cause. In some cases, the condition may go away on its own with home care.  You should drink plenty of fluids, take good care of your mouth, and avoid tobacco products. This information is not intended to replace advice given to you by your health care provider. Make sure you discuss any questions you have with your health care provider. Document Released: 08/10/2010 Document Revised: 02/03/2018 Document Reviewed: 02/03/2018 Elsevier Patient Education  [place].

## 2019-02-25 NOTE — Assessment & Plan Note (Signed)
Acute parotitis: That is the likely diagnosis on clinical grounds. Recommend amoxicillin (allergic to cephalosporin but tolerates PCNs) Good hydration, stimulating gland with lime or lemon, warm compress, call if not better.  See AVS

## 2019-02-28 ENCOUNTER — Telehealth: Payer: Self-pay | Admitting: Family Medicine

## 2019-02-28 MED ORDER — CLINDAMYCIN HCL 300 MG PO CAPS: 300.0000 mg | ORAL_CAPSULE | Freq: Three times a day (TID) | ORAL | 0 refills | Status: AC

## 2019-02-28 NOTE — Telephone Encounter (Signed)
Received after hrs call regarding pt. Seen Wed, no better, worse in fact. Has been compliant w Augmentin. Will change to [place]. If he does not wish to change, he can wait until tomorrow to sched appt w Dr. Larose Kells or seek care at [facility] today. No reports of fevers, spreading redness, drainage.

## 2019-03-01 DIAGNOSIS — L97921 Non-pressure chronic ulcer of unspecified part of left lower leg limited to breakdown of skin: Secondary | ICD-10-CM | POA: Diagnosis not present

## 2019-03-01 DIAGNOSIS — I872 Venous insufficiency (chronic) (peripheral): Secondary | ICD-10-CM | POA: Diagnosis not present

## 2019-03-01 DIAGNOSIS — I890 Lymphedema, not elsewhere classified: Secondary | ICD-10-CM | POA: Diagnosis not present

## 2019-03-01 DIAGNOSIS — L97911 Non-pressure chronic ulcer of unspecified part of right lower leg limited to breakdown of skin: Secondary | ICD-10-CM | POA: Diagnosis not present

## 2019-03-01 NOTE — Telephone Encounter (Signed)
Called to follow up on patient.  Wife states that patient is still in a little pain and swelling has gone down just a little.  She will call tomorrow for an appointment if no better.  He has appointment this afternoon and she wanted to wait.  Advised again to seek care at [facility] or ER if he gets worse.

## 2019-03-02 ENCOUNTER — Encounter: Payer: Self-pay | Admitting: Internal Medicine

## 2019-03-02 ENCOUNTER — Ambulatory Visit (INDEPENDENT_AMBULATORY_CARE_PROVIDER_SITE_OTHER): Payer: Medicare Other | Admitting: Internal Medicine

## 2019-03-02 ENCOUNTER — Other Ambulatory Visit: Payer: Self-pay

## 2019-03-02 DIAGNOSIS — K1121 Acute sialoadenitis: Secondary | ICD-10-CM | POA: Diagnosis not present

## 2019-03-02 MED ORDER — METRONIDAZOLE 500 MG PO TABS: 500.0000 mg | ORAL_TABLET | Freq: Three times a day (TID) | ORAL | 0 refills | Status: DC

## 2019-03-02 NOTE — Progress Notes (Signed)
Subjective:    Patient ID: Shaun Yoder, male    DOB: [birth year], 73 y.o.   MRN: [id number]  DOS:  03/02/2019 Type of visit - description: Virtual Visit via Video Note  I connected with@   by a video enabled telemedicine application and verified that I am speaking with the correct person using two identifiers.   THIS ENCOUNTER IS A VIRTUAL VISIT DUE TO COVID-19 - PATIENT WAS NOT SEEN IN THE OFFICE. PATIENT HAS CONSENTED TO VIRTUAL VISIT / TELEMEDICINE VISIT   Location of patient: home  Location of provider: office  I discussed the limitations of evaluation and management by telemedicine and the availability of in person appointments. The patient expressed understanding and agreed to proceed.  History of Present Illness: Acute 6 days ago was diagnosed with parotitis, prescribed Augmentin, was not improving much, was switched to clindamycin. Since then swelling and pain has decreased a little but also he thinks he is having side effects. His joints are hurting more than usual and requests to change clindamycin.    Review of Systems Denies fever, no chills. No difficulty swallowing or breathing.  Past Medical History:  Diagnosis Date  . Anemia   . Cancer ([place])    bladder  . Chronic venous insufficiency   . Colitis   . Colon polyps   . Depression   . Diverticulosis   . DVT of deep femoral vein ([place])   . Gastritis   . GERD (gastroesophageal reflux disease)   . History of rectal polyps   . Hyperlipidemia   . Hypertension   . Iron deficiency   . Low back pain   . Lymphedema of both lower extremities   . OA (osteoarthritis)   . Osteonecrosis of shoulder region ([place])   . Schizoaffective disorder, bipolar type ([place])   . Sleep apnea   . Varicose vein     Past Surgical History:  Procedure Laterality Date  . BLADDER SURGERY    . TOTAL KNEE ARTHROPLASTY Bilateral   . VARICOSE VEIN SURGERY      Social History   Socioeconomic History  . Marital status: Married     Spouse name: Not on file  . Number of children: 2  . Years of education: Not on file  . Highest education level: Not on file  Occupational History  . Occupation: retired  Scientific laboratory technician  . Financial resource strain: Not on file  . Food insecurity    Worry: Not on file    Inability: Not on file  . Transportation needs    Medical: Not on file    Non-medical: Not on file  Tobacco Use  . Smoking status: Former Smoker    Packs/day: 2.50    Years: 43.00    Pack years: 107.50    Types: Cigarettes    Quit date: 07/22/1998    Years since quitting: 20.6  . Smokeless tobacco: Never Used  Substance and Sexual Activity  . Alcohol use: Not Currently    Alcohol/week: 0.0 standard drinks  . Drug use: No  . Sexual activity: Not Currently  Lifestyle  . Physical activity    Days per week: Not on file    Minutes per session: Not on file  . Stress: Not on file  Relationships  . Social Herbalist on phone: Not on file    Gets together: Not on file    Attends religious service: Not on file    Active member of club or organization: Not  on file    Attends meetings of clubs or organizations: Not on file    Relationship status: Not on file  . Intimate partner violence    Fear of current or ex partner: Not on file    Emotionally abused: Not on file    Physically abused: Not on file    Forced sexual activity: Not on file  Other Topics Concern  . Not on file  Social History Narrative   Originally from [place]. Moved to [facility] in [place], 1985. 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 No known asbestos exposure. 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 No international travel. Has a dog currently. Remote exposure to Cockatiel in a different home. No mold exposure. No hot tub exposure.       Two adopted children      Allergies as of  03/02/2019      Reactions   Leflunomide Other (See Comments)   diarrhea   Ancef [cefazolin]    Rash   Morphine And Related Nausea And Vomiting   Plaquenil [hydroxychloroquine Sulfate]    rash      Medication List       Accurate as of March 02, 2019  3:52 PM. If you have any questions, ask your nurse or doctor.        ARIPiprazole 30 MG tablet Commonly known as: ABILIFY Take 30 mg by mouth at bedtime.   aspirin EC 81 MG tablet Take 81 mg by mouth daily.   augmented betamethasone dipropionate 0.05 % cream Commonly known as: DIPROLENE-AF Apply topically 2 (two) times daily. Apply to the rash of the right side of the face   Calcium 500/D 500-200 MG-UNIT tablet Generic drug: calcium-vitamin D Take 1 tablet by mouth daily with breakfast.   clindamycin 300 MG capsule Commonly known as: CLEOCIN Take 1 capsule (300 mg total) by mouth 3 (three) times daily for 7 days.   clotrimazole 1 % cream Commonly known as: LOTRIMIN Apply 1 application topically daily as needed.   divalproex 250 MG DR tablet Commonly known as: DEPAKOTE Take 250 mg by mouth daily with lunch.   divalproex 500 MG 24 hr tablet Commonly known as: DEPAKOTE ER Take 1,500 mg by mouth daily. 3 tabs at bedtime   Eliquis 5 MG Tabs tablet Generic drug: apixaban Take 2.5 mg by mouth 2 (two) times daily.   folic acid 1 MG tablet Commonly known as: FOLVITE Take 1 mg by mouth daily.   ketoconazole 2 % cream Commonly known as: NIZORAL Apply 1 application topically daily.   ketoconazole 2 % shampoo Commonly known as: NIZORAL LATHER AND MASSAGE INTO SCALP 2-3 TIMES PER WEEK. LEAVE FOR 5 MINUTES BEFORE RINSING.   Melatonin 3 MG Tabs Take 3 mg by mouth at bedtime.   mirabegron ER 50 MG Tb24 tablet Commonly known as: MYRBETRIQ Take 50 mg by mouth daily.   Omega-3 1000 MG Caps Take 1 capsule by mouth daily.   omeprazole 20 MG capsule Commonly known as: PRILOSEC Take 20 mg by mouth every morning.    oxybutynin 5 MG tablet Commonly known as: DITROPAN Take 5 mg by mouth 3 (three) times daily.   predniSONE 5 MG tablet Commonly known as: DELTASONE Take 5 mg by mouth daily with breakfast.   traMADol 50 MG tablet Commonly known as: ULTRAM Take 1 tablet (  50 mg total) by mouth every 6 (six) hours as needed.   triamcinolone cream 0.1 % Commonly known as: KENALOG Apply 1 application topically 2 (two) times daily.   vitamin C 500 MG tablet Commonly known as: ASCORBIC ACID Take 500 mg by mouth daily.   ziprasidone 80 MG capsule Commonly known as: GEODON Take 80 mg by mouth daily.   ziprasidone 20 MG capsule Commonly known as: GEODON Take 20 mg by mouth daily at 12 noon.           Objective:   Physical Exam There were no vitals taken for this visit. Virtual video visit, patient is alert oriented x3, seems at baseline, face is a slightly asymmetric with minimal swelling at the right side of the face consistent with history of parotitis    Assessment      Assessment   ( transfer from [facility],02-2015) Hypertension Hyperlipidemia Ao stenosis, severe ([place] Olena Heckle [place] MSK: --Seronegative Rheumatoid arthritis  --DJD-- DR Tamera Punt , dr Ronnie Derby --low back pain d/t DJD, Dr Sherlyn Lick  PSYCH: Schizophrenia, bipolar, depression: Follow-up at the [facility] [place] in [place] GU: Bladder cancer-transitional cell, Dr. Shona Needles GI:   GERD, history of colitis, history of gastritis,  colon polyps- had a virtual cscope 2016, + polyps, declined further eval Pulmomany:   --Sleep apnea:  Cpap intolerant --CT nodules f/u 2 pulmonary  --hypoxemia, dx 01-2017, unable to get O2 from medicare, eventually got O2 home-portable ~08-2017; off O2 by ~ 05/2018 Hematology: L LEG DVT 02-2016 + Lupus anticoagulant Varicose veins: s/p remote surgery, has LE wounds  [facility], [facility], Ortho, pain mngmt   PLAN Acute parotitis: Was not responding to Augmentin,  switched to clindamycin, he is better but reports that clindamycin is making him hurt more. Plan: DC clindamycin, go back on Augmentin, has  3 days supply  leftover.  Add Flagyl 500 mg 3 times a day, #15. Continue warm compress, suck on lime or lemon's and call if not gradually better.      I discussed the assessment and treatment plan with the patient. The patient was provided an opportunity to ask questions and all were answered. The patient agreed with the plan and demonstrated an understanding of the instructions.   The patient was advised to call back or seek an in-person evaluation if the symptoms worsen or if the condition fails to improve as anticipated.

## 2019-03-03 NOTE — Assessment & Plan Note (Signed)
Acute parotitis: Was not responding to Augmentin, switched to clindamycin, he is better but reports that clindamycin is making him hurt more. Plan: DC clindamycin, go back on Augmentin, has  3 days supply  leftover.  Add Flagyl 500 mg 3 times a day, #15. Continue warm compress, suck on lime or lemon's and call if not gradually better.

## 2019-03-15 DIAGNOSIS — L03116 Cellulitis of left lower limb: Secondary | ICD-10-CM | POA: Diagnosis not present

## 2019-05-04 DIAGNOSIS — L97412 Non-pressure chronic ulcer of right heel and midfoot with fat layer exposed: Secondary | ICD-10-CM | POA: Diagnosis not present

## 2019-05-04 DIAGNOSIS — I890 Lymphedema, not elsewhere classified: Secondary | ICD-10-CM | POA: Diagnosis not present

## 2019-05-04 DIAGNOSIS — I872 Venous insufficiency (chronic) (peripheral): Secondary | ICD-10-CM | POA: Diagnosis not present

## 2019-05-11 DIAGNOSIS — I872 Venous insufficiency (chronic) (peripheral): Secondary | ICD-10-CM | POA: Diagnosis not present

## 2019-05-11 DIAGNOSIS — L97412 Non-pressure chronic ulcer of right heel and midfoot with fat layer exposed: Secondary | ICD-10-CM | POA: Diagnosis not present

## 2019-05-11 DIAGNOSIS — I890 Lymphedema, not elsewhere classified: Secondary | ICD-10-CM | POA: Diagnosis not present

## 2019-05-27 DIAGNOSIS — I890 Lymphedema, not elsewhere classified: Secondary | ICD-10-CM | POA: Diagnosis not present

## 2019-05-27 DIAGNOSIS — I872 Venous insufficiency (chronic) (peripheral): Secondary | ICD-10-CM | POA: Diagnosis not present

## 2019-05-27 DIAGNOSIS — L97412 Non-pressure chronic ulcer of right heel and midfoot with fat layer exposed: Secondary | ICD-10-CM | POA: Diagnosis not present

## 2019-05-31 DIAGNOSIS — M79672 Pain in left foot: Secondary | ICD-10-CM | POA: Diagnosis not present

## 2019-05-31 DIAGNOSIS — M79671 Pain in right foot: Secondary | ICD-10-CM | POA: Diagnosis not present

## 2019-05-31 DIAGNOSIS — B351 Tinea unguium: Secondary | ICD-10-CM | POA: Diagnosis not present

## 2019-06-04 DIAGNOSIS — C678 Malignant neoplasm of overlapping sites of bladder: Secondary | ICD-10-CM | POA: Diagnosis not present

## 2019-07-02 ENCOUNTER — Other Ambulatory Visit: Payer: Self-pay

## 2019-07-02 ENCOUNTER — Ambulatory Visit: Payer: Self-pay | Admitting: *Deleted

## 2019-07-02 DIAGNOSIS — Z20828 Contact with and (suspected) exposure to other viral communicable diseases: Secondary | ICD-10-CM | POA: Diagnosis not present

## 2019-07-02 DIAGNOSIS — Z20822 Contact with and (suspected) exposure to covid-19: Secondary | ICD-10-CM

## 2019-07-02 NOTE — Telephone Encounter (Signed)
  I returned the call.   He and wife on speaker phone.    He woke up yesterday morning with a bad sore throat and congestion that has progressed into a cough.   No fever, no underlying medical conditions that put him at increased risk when asked.    They were wondering if he should be tested for COVID-19.   I let them know since it is wide spread in our community and with his symptoms it would be a good idea to be tested.    See triage notes and protocol notes.  Directions and hours given for the testing site at the [facility].  He has requested to be tested first  Then call back and make a virtual visit appt with Dr. Larose Kells.  I went over the signs/symptoms to watch for to either call us back or go to an urgent care or emergency room.   He and his wife were agreeable and verbalized understanding when to seek further medical help.  I forwarded my notes to Dr. Ethel Rana office so he would be aware of the situation also.     Reason for Disposition . COVID-19 Testing, questions about  Answer Assessment - Initial Assessment Questions 1. COVID-19 DIAGNOSIS: "Who made your Coronavirus (COVID-19) diagnosis?" "Was it confirmed by a positive lab test?" If not diagnosed by a HCP, ask "Are there lots of cases (community spread) where you live?" (See public health department website, if unsure)    No exposure to COVID-19 virus.    Yesterday started bad sore throat and congestion.  He is coughing up dark sputum.    He has aortic stenosis.   No diabetes.    2. COVID-19 EXPOSURE: "Was there any known exposure to COVID before the symptoms began?" CDC Definition of close contact: within 6 feet (2 meters) for a total of 15 minutes or more over a 24-hour period.      No exposure 3. ONSET: "When did the COVID-19 symptoms start?"      Yesterday morning. 4. WORST SYMPTOM: "What is your worst symptom?" (e.g., cough, fever, shortness of breath, muscle aches)     Sore throat is bad 5. COUGH: "Do you have a  cough?" If so, ask: "How bad is the cough?"       Yes  Coughing up dark sputum.   No blood.   Dark beige/yellow sputum. 6. FEVER: "Do you have a fever?" If so, ask: "What is your temperature, how was it measured, and when did it start?"     No fever.    7. RESPIRATORY STATUS: "Describe your breathing?" (e.g., shortness of breath, wheezing, unable to speak)      Wife could hear congestion in chest. 8. BETTER-SAME-WORSE: "Are you getting better, staying the same or getting worse compared to yesterday?"  If getting worse, ask, "In what way?"     Getting worse 9. HIGH RISK DISEASE: "Do you have any chronic medical problems?" (e.g., asthma, heart or lung disease, weak immune system, obesity, etc.)     No 10. PREGNANCY: "Is there any chance you are pregnant?" "When was your last menstrual period?"       N/A 11. OTHER SYMPTOMS: "Do you have any other symptoms?"  (e.g., chills, fatigue, headache, loss of smell or taste, muscle pain, sore throat; new loss of smell or taste especially support the diagnosis of COVID-19)       None of the above.  Protocols used: CORONAVIRUS (COVID-19) DIAGNOSED OR SUSPECTED-A-AH

## 2019-07-04 LAB — NOVEL CORONAVIRUS, NAA: SARS-CoV-2, NAA: NOT DETECTED

## 2019-07-05 ENCOUNTER — Ambulatory Visit (INDEPENDENT_AMBULATORY_CARE_PROVIDER_SITE_OTHER): Payer: Medicare Other | Admitting: Internal Medicine

## 2019-07-05 ENCOUNTER — Other Ambulatory Visit: Payer: Self-pay

## 2019-07-05 DIAGNOSIS — Z09 Encounter for follow-up examination after completed treatment for conditions other than malignant neoplasm: Secondary | ICD-10-CM

## 2019-07-05 DIAGNOSIS — M0579 Rheumatoid arthritis with rheumatoid factor of multiple sites without organ or systems involvement: Secondary | ICD-10-CM | POA: Diagnosis not present

## 2019-07-05 DIAGNOSIS — J40 Bronchitis, not specified as acute or chronic: Secondary | ICD-10-CM | POA: Diagnosis not present

## 2019-07-05 MED ORDER — DOXYCYCLINE HYCLATE 100 MG PO CAPS: 100.0000 mg | ORAL_CAPSULE | Freq: Two times a day (BID) | ORAL | 0 refills | Status: DC

## 2019-07-05 NOTE — Progress Notes (Addendum)
Subjective:    Patient ID: Shaun Yoder, male    DOB: 05-07-1946, 73 y.o.   MRN: [id number]  DOS:  07/05/2019 Type of visit - description: Attempted  to make this a video visit, due to technical difficulties from the patient side it was not possible  thus we proceeded with a Virtual Visit via Telephone    I connected with above mentioned patient  by telephone and verified that I am speaking with the correct person using two identifiers.  THIS ENCOUNTER IS A VIRTUAL VISIT DUE TO COVID-19 - PATIENT WAS NOT SEEN IN THE OFFICE. PATIENT HAS CONSENTED TO VIRTUAL VISIT / TELEMEDICINE VISIT   Location of patient: home  Location of provider: office  I discussed the limitations, risks, security and privacy concerns of performing an evaluation and management service by telephone and the availability of in person appointments. I also discussed with the patient that there may be a patient responsible charge related to this service. The patient expressed understanding and agreed to proceed.   History of Present Illness: Acute The patient started with symptoms approximately 3 days ago: Mild sore throat and chest congestion. On 07/02/2019 he and his wife were tested negative for COVID-19. He is calling because he continue with chest congestion and some rattling. Occasional wheezing?[name] No history of asthma or COPD.  Medication list reviewed and updated, he is now immunosuppressed with Humira, still taking prednisone.   Review of Systems No fever chills No chest pain no difficulty breathing No nausea, vomiting, diarrhea No myalgias.   Past Medical History:  Diagnosis Date  . Anemia   . Cancer ([place])    bladder  . Chronic venous insufficiency   . Colitis   . Colon polyps   . Depression   . Diverticulosis   . DVT of deep femoral vein ([place])   . Gastritis   . GERD (gastroesophageal reflux disease)   . History of rectal polyps   . Hyperlipidemia   . Hypertension   . Iron  deficiency   . Low back pain   . Lymphedema of both lower extremities   . OA (osteoarthritis)   . 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 Schizoaffective disorder, bipolar type ([place])   . Sleep apnea   . Varicose vein     Past Surgical History:  Procedure Laterality Date  . BLADDER SURGERY    . TOTAL KNEE ARTHROPLASTY Bilateral   . VARICOSE VEIN SURGERY      Social History   Socioeconomic History  . Marital status: Married    Spouse name: Not on file  . Number of children: 2  . Years of education: Not on file  . Highest education level: Not on file  Occupational History  . Occupation: retired  Tobacco Use  . Smoking status: Former Smoker    Packs/day: 2.50    Years: 43.00    Pack years: 107.50    Types: Cigarettes    Quit date: 07/22/1998    Years since quitting: 20.9  . Smokeless tobacco: Never Used  Substance and Sexual Activity  . Alcohol use: Not Currently    Alcohol/week: 0.0 standard drinks  . Drug use: No  . Sexual activity: Not Currently  Other Topics Concern  . Not on file  Social History Narrative   Originally from [place]. Moved to [facility] in July 22, 1985. 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 No known asbestos exposure.  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 No international travel. Has a dog currently. Remote exposure to Cockatiel in a different home. No mold exposure. No hot tub exposure.       Two adopted children   Social Determinants of Health   Financial Resource Strain:   . Difficulty of Paying Living Expenses: Not on file  Food Insecurity:   . Worried About Charity fundraiser in the Last Year: Not on file  . Ran Out of Food in the Last Year: Not on file  Transportation Needs:   . Lack of Transportation (Medical): Not on file  . Lack of Transportation (Non-Medical): Not on file    Physical Activity:   . Days of Exercise per Week: Not on file  . Minutes of Exercise per Session: Not on file  Stress:   . Feeling of Stress : Not on file  Social Connections:   . Frequency of Communication with Friends and Family: Not on file  . Frequency of Social Gatherings with Friends and Family: Not on file  . Attends Religious Services: Not on file  . Active Member of Clubs or Organizations: Not on file  . Attends Archivist Meetings: Not on file  . Marital Status: Not on file  Intimate Partner Violence:   . Fear of Current or Ex-Partner: Not on file  . Emotionally Abused: Not on file  . Physically Abused: Not on file  . Sexually Abused: Not on file      Allergies as of 07/05/2019      Reactions   Leflunomide Other (See Comments)   diarrhea   Ancef [cefazolin]    Rash   Morphine And Related Nausea And Vomiting   Plaquenil [hydroxychloroquine Sulfate]    rash      Medication List       Accurate as of July 05, 2019 11:59 PM. If you have any questions, ask your nurse or doctor.        STOP taking these medications   metroNIDAZOLE 500 MG tablet Commonly known as: FLAGYL Stopped by: Kathlene November, MD   traMADol 50 MG tablet Commonly known as: ULTRAM Stopped by: Kathlene November, MD     TAKE these medications   ARIPiprazole 30 MG tablet Commonly known as: ABILIFY Take 30 mg by mouth at bedtime.   aspirin EC 81 MG tablet Take 81 mg by mouth daily.   augmented betamethasone dipropionate 0.05 % cream Commonly known as: DIPROLENE-AF Apply topically 2 (two) times daily. Apply to the rash of the right side of the face   Calcium 500/D 500-200 MG-UNIT tablet Generic drug: calcium-vitamin D Take 1 tablet by mouth daily with breakfast.   clotrimazole 1 % cream Commonly known as: LOTRIMIN Apply 1 application topically daily as needed.   divalproex 250 MG DR tablet Commonly known as: DEPAKOTE Take 250 mg by mouth daily with lunch.   divalproex 500 MG 24  hr tablet Commonly known as: DEPAKOTE ER Take 1,500 mg by mouth daily. 3 tabs at bedtime   doxycycline 100 MG capsule Commonly known as: VIBRAMYCIN Take 1 capsule (100 mg total) by mouth 2 (two) times daily. Started by: Kathlene November, MD   Eliquis 5 MG Tabs tablet Generic drug: apixaban Take 2.5 mg by mouth 2 (two) times daily.   folic acid 1 MG tablet Commonly known as: FOLVITE Take 1 mg by mouth daily.   Humira 40  MG/0.4ML Pskt Generic drug: Adalimumab Inject into the skin.   HYDROcodone-acetaminophen 7.5-325 MG tablet Commonly known as: NORCO Take 1 tablet by mouth every 6 (six) hours as needed for moderate pain.   ketoconazole 2 % cream Commonly known as: NIZORAL Apply 1 application topically daily.   ketoconazole 2 % shampoo Commonly known as: NIZORAL LATHER AND MASSAGE INTO SCALP 2-3 TIMES PER WEEK. LEAVE FOR 5 MINUTES BEFORE RINSING.   Melatonin 3 MG Tabs Take 3 mg by mouth at bedtime.   mirabegron ER 50 MG Tb24 tablet Commonly known as: MYRBETRIQ Take 50 mg by mouth daily.   Omega-3 1000 MG Caps Take 1 capsule by mouth daily.   omeprazole 20 MG capsule Commonly known as: PRILOSEC Take 20 mg by mouth every morning.   oxybutynin 5 MG tablet Commonly known as: DITROPAN Take 5 mg by mouth 3 (three) times daily.   predniSONE 5 MG tablet Commonly known as: DELTASONE Take 5 mg by mouth daily with breakfast.   triamcinolone cream 0.1 % Commonly known as: KENALOG Apply 1 application topically 2 (two) times daily.   vitamin C 500 MG tablet Commonly known as: ASCORBIC ACID Take 500 mg by mouth daily.   ziprasidone 80 MG capsule Commonly known as: GEODON Take 80 mg by mouth daily.   ziprasidone 20 MG capsule Commonly known as: GEODON Take 20 mg by mouth daily at 12 noon.           Objective:   Physical Exam There were no vitals taken for this visit. This is a virtual visit, I was able to contact the patient via video briefly, he looked at baseline  and in no distress, then we had to switch to phone, he sounded well, alert oriented x3 and speaking in complete sentences    Assessment     Assessment   ( transfer from [facility],02-2015) Hypertension Hyperlipidemia Ao stenosis, severe ([place]lena Heckle [place] MSK: --Seronegative Rheumatoid arthritis  --DJD-- DR Tamera Punt , dr Ronnie Derby --low back pain d/t DJD, Dr Sherlyn Lick  PSYCH: Schizophrenia, bipolar, depression: Follow-up at the [facility] in [place] GU: Bladder cancer-transitional cell, Dr. Shona Needles GI:   GERD, history of colitis, history of gastritis,  colon polyps- had a virtual cscope 2016, + polyps, declined further eval Pulmomany:   --Sleep apnea:  Cpap intolerant --CT nodules f/u 2 pulmonary  --hypoxemia, dx 01-2017, unable to get O2 from medicare, eventually got O2 home-portable ~08-2017; off O2 by ~ 05/2018 Hematology: L LEG DVT 02-2016 + Lupus anticoagulant Varicose veins: s/p remote surgery, has LE wounds  [facility], [facility]o, pain mngmt   PLAN Bronchitis. Symptoms likely due to bronchitis, he is immunosuppressed, fortunately just tested negative for COVID-19. Recommend  doxycycline, Robitussin-DM or Mucinex DM, good hydration and pay attention to his symptoms.  If he is not gradually improving let me know. Question of wheezing, will consider albuterol. DJD: Recently switched from tramadol to hydrocodone by pain management Seronegative rheumatoid arthritis: he is now going Humira    I discussed the assessment and treatment plan with the patient. The patient was provided an opportunity to ask questions and all were answered. The patient agreed with the plan and demonstrated an understanding of the instructions.   The patient was advised to call back or seek an in-person evaluation if the symptoms worsen or if the condition fails to improve as anticipated.  I provided 20 minutes of non-face-to-face time during this  encounter.  Kathlene November, MD

## 2019-07-06 NOTE — Assessment & Plan Note (Addendum)
Bronchitis. Symptoms likely due to bronchitis, he is immunosuppressed, fortunately just tested negative for COVID-19. Recommend  doxycycline, Robitussin-DM or Mucinex DM, good hydration and pay attention to his symptoms.  If he is not gradually improving let me know. Question of wheezing, will consider albuterol. DJD: Recently switched from tramadol to hydrocodone by pain management Seronegative rheumatoid arthritis: he is now going Humira

## 2019-07-12 ENCOUNTER — Ambulatory Visit (INDEPENDENT_AMBULATORY_CARE_PROVIDER_SITE_OTHER): Payer: Medicare Other | Admitting: Internal Medicine

## 2019-07-12 ENCOUNTER — Other Ambulatory Visit: Payer: Self-pay

## 2019-07-12 DIAGNOSIS — J40 Bronchitis, not specified as acute or chronic: Secondary | ICD-10-CM

## 2019-07-12 DIAGNOSIS — G4734 Idiopathic sleep related nonobstructive alveolar hypoventilation: Secondary | ICD-10-CM | POA: Diagnosis not present

## 2019-07-12 NOTE — Progress Notes (Signed)
Subjective:    Patient ID: Shaun Yoder, male    DOB: [birth year], 73 y.o.   MRN: [id number]  DOS:  07/12/2019 Type of visit - description: Virtual Visit via Video Note  I connected with the above patient  by a video enabled telemedicine application and verified that I am speaking with the correct person using two identifiers.   THIS ENCOUNTER IS A VIRTUAL VISIT DUE TO COVID-19 - PATIENT WAS NOT SEEN IN THE OFFICE. PATIENT HAS CONSENTED TO VIRTUAL VISIT / TELEMEDICINE VISIT   Location of patient: home  Location of provider: office  I discussed the limitations of evaluation and management by telemedicine and the availability of in person appointments. The patient expressed understanding and agreed to proceed.  History of Present Illness: Acute virtual visit, here with his wife Recently the patient was seen with bronchitis, he is still taking doxycycline, he has 1 or 2 days left. He was getting better until yesterday when she developed sore throat again, some cough, yellowish sputum production.    Review of Systems  No fever chills No chest pain no difficulty breathing at rest Appetite is okay He has problems with lower extremity skin infections, the legs do not seem to be infected at this point.  Past Medical History:  Diagnosis Date  . Anemia   . Cancer ([place])    bladder  . Chronic venous insufficiency   . Colitis   . Colon polyps   . Depression   . Diverticulosis   . DVT of deep femoral vein ([place])   . Gastritis   . GERD (gastroesophageal reflux disease)   . History of rectal polyps   . Hyperlipidemia   . Hypertension   . Iron deficiency   . Low back pain   . Lymphedema of both lower extremities   . OA (osteoarthritis)   . Osteonecrosis of shoulder region ([place])   . Schizoaffective disorder, bipolar type ([place])   . Sleep apnea   . Varicose vein     Past Surgical History:  Procedure Laterality Date  . BLADDER SURGERY    . TOTAL KNEE ARTHROPLASTY  Bilateral   . VARICOSE VEIN SURGERY      Social History   Socioeconomic History  . Marital status: Married    Spouse name: Not on file  . Number of children: 2  . Years of education: Not on file  . Highest education level: Not on file  Occupational History  . Occupation: retired  Tobacco Use  . Smoking status: Former Smoker    Packs/day: 2.50    Years: 43.00    Pack years: 107.50    Types: Cigarettes    Quit date: 07/22/1998    Years since quitting: 20.9  . Smokeless tobacco: Never Used  Substance and Sexual Activity  . Alcohol use: Not Currently    Alcohol/week: 0.0 standard drinks  . Drug use: No  . Sexual activity: Not Currently  Other Topics Concern  . Not on file  Social History Narrative   Originally from [place]. Moved to [facility] in July 22, 1985. 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 No known asbestos exposure. 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 No international travel. Has a dog currently. Remote exposure to Cockatiel in a different home. No mold exposure. No hot tub exposure.  Two adopted children   Social Determinants of Health   Financial Resource Strain:   . Difficulty of Paying Living Expenses: Not on file  Food Insecurity:   . Worried About Charity fundraiser in the Last Year: Not on file  . Ran Out of Food in the Last Year: Not on file  Transportation Needs:   . Lack of Transportation (Medical): Not on file  . Lack of Transportation (Non-Medical): Not on file  Physical Activity:   . Days of Exercise per Week: Not on file  . Minutes of Exercise per Session: Not on file  Stress:   . Feeling of Stress : Not on file  Social Connections:   . Frequency of Communication with Friends and Family: Not on file  . Frequency of Social Gatherings with Friends and Family: Not on file  . Attends  Religious Services: Not on file  . Active Member of Clubs or Organizations: Not on file  . Attends Archivist Meetings: Not on file  . Marital Status: Not on file  Intimate Partner Violence:   . Fear of Current or Ex-Partner: Not on file  . Emotionally Abused: Not on file  . Physically Abused: Not on file  . Sexually Abused: Not on file      Allergies as of 07/12/2019      Reactions   Leflunomide Other (See Comments)   diarrhea   Ancef [cefazolin]    Rash   Morphine And Related Nausea And Vomiting   Plaquenil [hydroxychloroquine Sulfate]    rash      Medication List       Accurate as of July 12, 2019  2:00 PM. If you have any questions, ask your nurse or doctor.        ARIPiprazole 30 MG tablet Commonly known as: ABILIFY Take 30 mg by mouth at bedtime.   aspirin EC 81 MG tablet Take 81 mg by mouth daily.   augmented betamethasone dipropionate 0.05 % cream Commonly known as: DIPROLENE-AF Apply topically 2 (two) times daily. Apply to the rash of the right side of the face   Calcium 500/D 500-200 MG-UNIT tablet Generic drug: calcium-vitamin D Take 1 tablet by mouth daily with breakfast.   clotrimazole 1 % cream Commonly known as: LOTRIMIN Apply 1 application topically daily as needed.   divalproex 250 MG DR tablet Commonly known as: DEPAKOTE Take 250 mg by mouth daily with lunch.   divalproex 500 MG 24 hr tablet Commonly known as: DEPAKOTE ER Take 1,500 mg by mouth daily. 3 tabs at bedtime   doxycycline 100 MG capsule Commonly known as: VIBRAMYCIN Take 1 capsule (100 mg total) by mouth 2 (two) times daily.   Eliquis 5 MG Tabs tablet Generic drug: apixaban Take 2.5 mg by mouth 2 (two) times daily.   folic acid 1 MG tablet Commonly known as: FOLVITE Take 1 mg by mouth daily.   Humira 40 MG/0.4ML Pskt Generic drug: Adalimumab Inject into the skin.   HYDROcodone-acetaminophen 7.5-325 MG tablet Commonly known as: NORCO Take 1 tablet by  mouth every 6 (six) hours as needed for moderate pain.   ketoconazole 2 % cream Commonly known as: NIZORAL Apply 1 application topically daily.   ketoconazole 2 % shampoo Commonly known as: NIZORAL LATHER AND MASSAGE INTO SCALP 2-3 TIMES PER WEEK. LEAVE FOR 5 MINUTES BEFORE RINSING.   Melatonin 3 MG Tabs Take 3 mg by mouth at bedtime.   mirabegron ER 50 MG Tb24 tablet Commonly known as:  MYRBETRIQ Take 50 mg by mouth daily.   Omega-3 1000 MG Caps Take 1 capsule by mouth daily.   omeprazole 20 MG capsule Commonly known as: PRILOSEC Take 20 mg by mouth every morning.   oxybutynin 5 MG tablet Commonly known as: DITROPAN Take 5 mg by mouth 3 (three) times daily.   predniSONE 5 MG tablet Commonly known as: DELTASONE Take 5 mg by mouth daily with breakfast.   triamcinolone cream 0.1 % Commonly known as: KENALOG Apply 1 application topically 2 (two) times daily.   vitamin C 500 MG tablet Commonly known as: ASCORBIC ACID Take 500 mg by mouth daily.   ziprasidone 80 MG capsule Commonly known as: GEODON Take 80 mg by mouth daily.   ziprasidone 20 MG capsule Commonly known as: GEODON Take 20 mg by mouth daily at 12 noon.           Objective:   Physical Exam There were no vitals taken for this visit. He is alert oriented x3, no distress, sitting in his recliner and seems to be at baseline. The family check the O2 sat: It is started at 84% and went up to 87% at rest.    Assessment      Assessment   ( transfer from [facility],02-2015) Hypertension Hyperlipidemia Ao stenosis, severe ([place]lena Heckle [place] MSK: --Seronegative Rheumatoid arthritis  --DJD-- DR Tamera Punt , dr Ronnie Derby --low back pain d/t DJD, Dr Sherlyn Lick  PSYCH: Schizophrenia, bipolar, depression: Follow-up at the [facility] in [place] GU: Bladder cancer-transitional cell, Dr. Shona Needles GI:   GERD, history of colitis, history of gastritis,  colon polyps- had a virtual cscope 2016, + polyps, declined further  eval Pulmomany:   --Sleep apnea:  Cpap intolerant --CT nodules f/u 2 pulmonary  --hypoxemia, dx 01-2017, unable to get O2 from medicare, eventually got O2 home-portable ~08-2017; off O2 by ~ 05/2018 Hematology: L LEG DVT 02-2016 + Lupus anticoagulant Varicose veins: s/p remote surgery, has LE wounds  H/o  iron deficiency anemia [facility], [facility], psych, Ortho, pain mngmt   PLAN Bronchitis, worsening chronic hypoxemia: The patient was seen virtually and diagnosed with bronchitis on 07/05/2019, was Rx on doxycycline.  Symptoms initially improved but they restarted yesterday. He is family check his O2 sat during this visit and it was initially 84% and went up to 87% at rest. He has known hypoxemia however  O2 sats are is typically higher than today's reading.    I am concerned, he needs further evaluation, recommend ER. They verbalized understanding.    I discussed the assessment and treatment plan with the patient. The patient was provided an opportunity to ask questions and all were answered. The patient agreed with the plan and demonstrated an understanding of the instructions.   The patient was advised to call back or seek an in-person evaluation if the symptoms worsen or if the condition fails to improve as anticipated.

## 2019-07-13 ENCOUNTER — Telehealth: Payer: Self-pay | Admitting: Internal Medicine

## 2019-07-13 MED ORDER — PREDNISONE 10 MG PO TABS: ORAL_TABLET | ORAL | 0 refills | Status: DC

## 2019-07-13 MED ORDER — ALBUTEROL SULFATE HFA 108 (90 BASE) MCG/ACT IN AERS: 2.0000 | INHALATION_SPRAY | Freq: Four times a day (QID) | RESPIRATORY_TRACT | 1 refills | Status: DC | PRN

## 2019-07-13 NOTE — Telephone Encounter (Signed)
I noticed the patient did not go to the ER.  He refused to go. I spoke with the patient and his wife, symptoms are about the same, O2 sat is still 87%. Plan: Prednisone taper, after he is done go back to his routine 5 mg daily Continue Mucinex DM Although he is not wheezing he might benefit from albuterol every 6 hours as needed for persisting cough. Rx sent.

## 2019-07-13 NOTE — Assessment & Plan Note (Signed)
Bronchitis, worsening chronic hypoxemia: The patient was seen virtually and diagnosed with bronchitis on 07/05/2019, was Rx on doxycycline.  Symptoms initially improved but they restarted yesterday. He is family check his O2 sat during this visit and it was initially 84% and went up to 87% at rest. He has known hypoxemia however  O2 sats are is typically higher than today's reading.    I am concerned, he needs further evaluation, recommend ER. They verbalized understanding.

## 2019-07-19 ENCOUNTER — Other Ambulatory Visit: Payer: Self-pay | Admitting: Internal Medicine

## 2019-07-19 DIAGNOSIS — L219 Seborrheic dermatitis, unspecified: Secondary | ICD-10-CM

## 2019-07-27 DIAGNOSIS — L97821 Non-pressure chronic ulcer of other part of left lower leg limited to breakdown of skin: Secondary | ICD-10-CM | POA: Diagnosis not present

## 2019-07-27 DIAGNOSIS — L97921 Non-pressure chronic ulcer of unspecified part of left lower leg limited to breakdown of skin: Secondary | ICD-10-CM | POA: Diagnosis not present

## 2019-07-27 DIAGNOSIS — I872 Venous insufficiency (chronic) (peripheral): Secondary | ICD-10-CM | POA: Diagnosis not present

## 2019-07-27 DIAGNOSIS — I890 Lymphedema, not elsewhere classified: Secondary | ICD-10-CM | POA: Diagnosis not present

## 2019-07-30 DIAGNOSIS — L97821 Non-pressure chronic ulcer of other part of left lower leg limited to breakdown of skin: Secondary | ICD-10-CM | POA: Diagnosis not present

## 2019-07-30 DIAGNOSIS — I890 Lymphedema, not elsewhere classified: Secondary | ICD-10-CM | POA: Diagnosis not present

## 2019-07-30 DIAGNOSIS — I872 Venous insufficiency (chronic) (peripheral): Secondary | ICD-10-CM | POA: Diagnosis not present

## 2019-08-03 DIAGNOSIS — L97222 Non-pressure chronic ulcer of left calf with fat layer exposed: Secondary | ICD-10-CM | POA: Diagnosis not present

## 2019-08-03 DIAGNOSIS — I890 Lymphedema, not elsewhere classified: Secondary | ICD-10-CM | POA: Diagnosis not present

## 2019-08-03 DIAGNOSIS — I872 Venous insufficiency (chronic) (peripheral): Secondary | ICD-10-CM | POA: Diagnosis not present

## 2019-08-03 DIAGNOSIS — L97921 Non-pressure chronic ulcer of unspecified part of left lower leg limited to breakdown of skin: Secondary | ICD-10-CM | POA: Diagnosis not present

## 2019-08-04 DIAGNOSIS — L97821 Non-pressure chronic ulcer of other part of left lower leg limited to breakdown of skin: Secondary | ICD-10-CM | POA: Diagnosis not present

## 2019-08-04 DIAGNOSIS — I872 Venous insufficiency (chronic) (peripheral): Secondary | ICD-10-CM | POA: Diagnosis not present

## 2019-08-04 DIAGNOSIS — I890 Lymphedema, not elsewhere classified: Secondary | ICD-10-CM | POA: Diagnosis not present

## 2019-08-08 ENCOUNTER — Other Ambulatory Visit: Payer: Self-pay

## 2019-08-08 ENCOUNTER — Encounter (HOSPITAL_BASED_OUTPATIENT_CLINIC_OR_DEPARTMENT_OTHER): Payer: Self-pay | Admitting: Emergency Medicine

## 2019-08-08 ENCOUNTER — Emergency Department (HOSPITAL_BASED_OUTPATIENT_CLINIC_OR_DEPARTMENT_OTHER)
Admission: EM | Admit: 2019-08-08 | Discharge: 2019-08-08 | Disposition: A | Discharge status: Home / Self Care | Payer: Medicare Other | Attending: Emergency Medicine | Admitting: Emergency Medicine

## 2019-08-08 DIAGNOSIS — Z96653 Presence of artificial knee joint, bilateral: Secondary | ICD-10-CM | POA: Diagnosis not present

## 2019-08-08 DIAGNOSIS — Z7901 Long term (current) use of anticoagulants: Secondary | ICD-10-CM | POA: Insufficient documentation

## 2019-08-08 DIAGNOSIS — I10 Essential (primary) hypertension: Secondary | ICD-10-CM | POA: Insufficient documentation

## 2019-08-08 DIAGNOSIS — R001 Bradycardia, unspecified: Secondary | ICD-10-CM | POA: Diagnosis not present

## 2019-08-08 DIAGNOSIS — Z87891 Personal history of nicotine dependence: Secondary | ICD-10-CM | POA: Diagnosis not present

## 2019-08-08 DIAGNOSIS — Z79899 Other long term (current) drug therapy: Secondary | ICD-10-CM | POA: Diagnosis not present

## 2019-08-08 DIAGNOSIS — Z8551 Personal history of malignant neoplasm of bladder: Secondary | ICD-10-CM | POA: Insufficient documentation

## 2019-08-08 DIAGNOSIS — Z4800 Encounter for change or removal of nonsurgical wound dressing: Secondary | ICD-10-CM | POA: Diagnosis not present

## 2019-08-08 DIAGNOSIS — Z5189 Encounter for other specified aftercare: Secondary | ICD-10-CM | POA: Diagnosis not present

## 2019-08-08 DIAGNOSIS — Z7982 Long term (current) use of aspirin: Secondary | ICD-10-CM | POA: Insufficient documentation

## 2019-08-08 DIAGNOSIS — L139 Bullous disorder, unspecified: Secondary | ICD-10-CM | POA: Diagnosis not present

## 2019-08-08 NOTE — Discharge Instructions (Addendum)
There was no active bleeding on your examination today. Please get rechecked if you develop new evidence of bleeding such as wet or new bloodstains on your dressing.

## 2019-08-08 NOTE — ED Provider Notes (Signed)
[facility] EMERGENCY DEPARTMENT Provider Note   CSN: [id number] Arrival date & time: 08/08/19  1522     History Chief Complaint  Patient presents with  . Wound Check    Shaun Yoder is a 74 y.o. male.  The history is provided by the patient and medical records. No language interpreter was used.  Wound Check   Shaun Yoder is a 74 y.o. male who presents to the Emergency Department complaining of wound check. He presents the emergency department for evaluation of bleeding to chronic wound to the left lower extremity. He is followed by the wound center for venous stasis ulcers and wounds to the left lower extremity. When he was seen on January 12 he had significant bleeding from a ruptured bola and required rapping in his Eliquis was discontinued. He was rechecked the next day and things were stable. He is scheduled to be seen in two days for recheck. He states that he sleeps rough and noticed that he had some blood on the bandage today and comes in for evaluation due to concern for bleeding. He denies any fevers, chills, shortness of breath. He states that the his leg pain is similar to his prior pain. No additional symptoms. He states the dressing changes are very painful for him.    Past Medical History:  Diagnosis Date  . Anemia   . Cancer ([place])    bladder  . Chronic venous insufficiency   . Colitis   . Colon polyps   . Depression   . Diverticulosis   . DVT of deep femoral vein ([place])   . Gastritis   . GERD (gastroesophageal reflux disease)   . History of rectal polyps   . Hyperlipidemia   . Hypertension   . Iron deficiency   . Low back pain   . Lymphedema of both lower extremities   . OA (osteoarthritis)   . 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 Schizoaffective disorder, bipolar type ([place])   . Sleep apnea   . Varicose vein     Patient Active Problem List   Diagnosis Date Noted  . Chronic respiratory failure with hypoxia ([place]) 03/19/2018    . Bacteremia due to methicillin susceptible Staphylococcus aureus (MSSA) 12/26/2017  . Cellulitis of left leg 12/25/2017  . Sepsis ([place]) 08/28/2017  . Nocturnal hypoxemia 02/11/2017  . DJD (degenerative joint disease) 12/06/2016  . Chronic right shoulder pain 12/06/2016  . Contracture of joint of right shoulder region 12/06/2016  . Chronic left hip pain 09/12/2016  . Status post bilateral knee replacements 09/12/2016  . Gait disorder 06/03/2016  . Left leg DVT ([place]) 04/04/2016  . Non-pressure chronic ulcer of right ankle with fat layer exposed ([place]) 10/25/2015  . Varicose veins of lower extremities with ulcer ([place]) 09/07/2015  . Annual physical exam 05/23/2015  . PCP NOTES >>>>>>>>>>>>>>>>> 03/28/2015  . Hypertension 03/28/2015  . Pulmonary nodules 12/09/2014  . Sleep apnea 11/14/2014  . GERD (gastroesophageal reflux disease) 11/14/2014  . Morbid obesity ([place]) 11/14/2014  . Aortic stenosis, severe   . Schizoaffective disorder, bipolar type ([place])   . Rheumatoid arthritis ([place])   . Hyperlipidemia   . Bifasicular block   . Mononeuritis of upper limb 05/20/2013  . Pain in soft tissues of limb 05/20/2013  . 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 Depression 01/13/2013  . Memory loss 01/13/2013    Past Surgical History:  Procedure Laterality Date  . BLADDER SURGERY    . TOTAL KNEE ARTHROPLASTY Bilateral   .  VARICOSE VEIN SURGERY         Family History  Problem Relation Age of Onset  . Heart attack Mother   . Heart attack Father   . Stroke Father   . Rheumatologic disease Neg Hx   . Colon cancer Neg Hx   . Lung disease Neg Hx   . Prostate cancer Neg Hx     Social History   Tobacco Use  . Smoking status: Former Smoker    Packs/day: 2.50    Years: 43.00    Pack years: 107.50    Types: Cigarettes    Quit date: 07/22/1998    Years since quitting: 21.0  . Smokeless tobacco: Never Used  Substance Use Topics  . Alcohol use: Not Currently    Alcohol/week: 0.0  standard drinks  . Drug use: No    Home Medications Prior to Admission medications   Medication Sig Start Date End Date Taking? Authorizing Provider  Adalimumab (HUMIRA) 40 MG/0.4ML PSKT Inject into the skin.    [provider]  albuterol (VENTOLIN HFA) 108 (90 Base) MCG/ACT inhaler Inhale 2 puffs into the lungs every 6 (six) hours as needed for wheezing or shortness of breath. 07/13/19   Colon Branch, MD  apixaban (ELIQUIS) 5 MG TABS tablet Take 2.5 mg by mouth 2 (two) times daily.    [provider]  ARIPiprazole (ABILIFY) 30 MG tablet Take 30 mg by mouth at bedtime.      [provider]  aspirin EC 81 MG tablet Take 81 mg by mouth daily.    [provider]  augmented betamethasone dipropionate (DIPROLENE-AF) 0.05 % cream Apply topically 2 (two) times daily. Apply to the rash of the right side of the face 01/19/18   Colon Branch, MD  calcium-vitamin D (CALCIUM 500/D) 500-200 MG-UNIT tablet Take 1 tablet by mouth daily with breakfast.     [provider]  clotrimazole (LOTRIMIN) 1 % cream Apply 1 application topically daily as needed.    [provider]  divalproex (DEPAKOTE ER) 500 MG 24 hr tablet Take 1,500 mg by mouth daily. 3 tabs at bedtime    [provider]  divalproex (DEPAKOTE) 250 MG DR tablet Take 250 mg by mouth daily with lunch.     [provider]  doxycycline (VIBRAMYCIN) 100 MG capsule Take 1 capsule (100 mg total) by mouth 2 (two) times daily. 07/05/19   Colon Branch, MD  folic acid (FOLVITE) 1 MG tablet Take 1 mg by mouth daily.      [provider]  HYDROcodone-acetaminophen (NORCO) 7.5-325 MG tablet Take 1 tablet by mouth every 6 (six) hours as needed for moderate pain.    [provider]  ketoconazole (NIZORAL) 2 % cream Apply 1 application topically daily. 09/16/18   Colon Branch, MD  ketoconazole (NIZORAL) 2 % shampoo LATHER AND MASSAGE INTO SCALP 2-3 TIMES PER WEEK. LEAVE FOR 5 MINUTES  BEFORE RINSING. 07/19/19   Colon Branch, MD  Melatonin 3 MG TABS Take 3 mg by mouth at bedtime.    [provider]  mirabegron ER (MYRBETRIQ) 50 MG TB24 tablet Take 50 mg by mouth daily.    [provider]  Omega-3 1000 MG CAPS Take 1 capsule by mouth daily.     [provider]  omeprazole (PRILOSEC) 20 MG capsule Take 20 mg by mouth every morning.     [provider]  oxybutynin (DITROPAN) 5 MG tablet Take 5 mg by mouth  3 (three) times daily.     [provider]  predniSONE (DELTASONE) 10 MG tablet 3 tabs x 3 days, 2 tabs x 3 days, 1 tab x 3 days 07/13/19   Colon Branch, MD  predniSONE (DELTASONE) 5 MG tablet Take 5 mg by mouth daily with breakfast.    [provider]  triamcinolone cream (KENALOG) 0.1 % Apply 1 application topically 2 (two) times daily.    [provider]  vitamin C (ASCORBIC ACID) 500 MG tablet Take 500 mg by mouth daily.    [provider]  ziprasidone (GEODON) 20 MG capsule Take 20 mg by mouth daily at 12 noon.    [provider]  ziprasidone (GEODON) 80 MG capsule Take 80 mg by mouth daily.    [provider]    Allergies    Leflunomide, Ancef [cefazolin], Morphine and related, and Plaquenil [hydroxychloroquine sulfate]  Review of Systems   Review of Systems  All other systems reviewed and are negative.   Physical Exam Updated Vital Signs BP 104/81 (BP Location: Right Arm)   Pulse (!) 54   Temp 98.8 F (37.1 C) (Oral)   Resp 19   Ht 5\' 1"  (1.549 m)   Wt 95.3 kg   SpO2 92%   BMI 39.68 kg/m   Physical Exam Vitals and nursing note reviewed.  Constitutional:      Appearance: He is well-developed.     Comments: Chronically ill appearing  HENT:     Head: Normocephalic and atraumatic.  Cardiovascular:     Rate and Rhythm: Regular rhythm. Bradycardia present.     Heart sounds: Murmur present.  Pulmonary:     Effort: Pulmonary effort is normal. No respiratory distress.      Breath sounds: Normal breath sounds.  Abdominal:     Palpations: Abdomen is soft.     Tenderness: There is no abdominal tenderness. There is no guarding or rebound.  Musculoskeletal:        General: No tenderness.     Comments: Edema to bilateral lower extremities, left greater than right. There is a dressing in place to the left lower extremity from the mid foot to the knee. There is a small area of dried blood on the distal anterior portion of the dressing. There is no distal or proximal erythema.  Skin:    General: Skin is warm and dry.  Neurological:     Mental Status: He is alert and oriented to person, place, and time.  Psychiatric:        Behavior: Behavior normal.     ED Results / Procedures / Treatments   Labs (all labs ordered are listed, but only abnormal results are displayed) Labs Reviewed - No data to display  EKG None  Radiology No results found.  Procedures Procedures (including critical care time)  Medications Ordered in ED Medications - No data to display  ED Course  I have reviewed the triage vital signs and the nursing notes.  Pertinent labs & imaging results that were available during my care of the patient were reviewed by me and considered in my medical decision making (see chart for details).    MDM Rules/Calculators/A&P                     Patient here for evaluation of wound check due to concern for bleeding. He has one small area of dried blood on his dressing, no evidence of active bleeding. Discussed with patient risks and  benefits of taking down his current wound dressing in the emergency department. He has no infectious symptoms. Patient reports that dressing changes are painful and he would prefer to wait for wound center follow-up for dressing change at that time. Discussed with patient and wife bleeding precautions, outpatient follow-up and return precautions.  Final Clinical Impression(s) / ED Diagnoses Final diagnoses:  Encounter  for wound re-check    Rx / DC Orders ED Discharge Orders    None       Quintella Reichert, MD 08/08/19 1829

## 2019-08-08 NOTE — ED Notes (Signed)
Pt's dressing marked by Dr. Ralene Bathe with permanent marker prior to pt discharge

## 2019-08-08 NOTE — ED Notes (Signed)
ED Provider at bedside. Dr. Ralene Bathe states she has spoken with pt's wife regarding plan of care

## 2019-08-08 NOTE — ED Triage Notes (Signed)
Pt is followed by the [facility] wound center for blisters to lower legs. He is here today because it is oozing more than normal and is painful.

## 2019-08-08 NOTE — ED Notes (Signed)
ED Provider at bedside. 

## 2019-08-10 DIAGNOSIS — I890 Lymphedema, not elsewhere classified: Secondary | ICD-10-CM | POA: Diagnosis not present

## 2019-08-10 DIAGNOSIS — I872 Venous insufficiency (chronic) (peripheral): Secondary | ICD-10-CM | POA: Diagnosis not present

## 2019-08-10 DIAGNOSIS — L97821 Non-pressure chronic ulcer of other part of left lower leg limited to breakdown of skin: Secondary | ICD-10-CM | POA: Diagnosis not present

## 2019-08-11 ENCOUNTER — Other Ambulatory Visit: Payer: Self-pay

## 2019-08-11 ENCOUNTER — Other Ambulatory Visit (INDEPENDENT_AMBULATORY_CARE_PROVIDER_SITE_OTHER): Payer: Medicare Other

## 2019-08-11 DIAGNOSIS — R32 Unspecified urinary incontinence: Secondary | ICD-10-CM | POA: Diagnosis not present

## 2019-08-12 ENCOUNTER — Telehealth: Payer: Self-pay

## 2019-08-12 ENCOUNTER — Telehealth: Payer: Self-pay | Admitting: Internal Medicine

## 2019-08-12 LAB — URINALYSIS, ROUTINE W REFLEX MICROSCOPIC
Bilirubin Urine: NEGATIVE
Ketones, ur: NEGATIVE
Leukocytes,Ua: NEGATIVE
Nitrite: NEGATIVE
Specific Gravity, Urine: 1.02 (ref 1.000–1.030)
Total Protein, Urine: NEGATIVE
Urine Glucose: NEGATIVE
Urobilinogen, UA: 4 — AB (ref 0.0–1.0)
pH: 7.5 (ref 5.0–8.0)

## 2019-08-12 LAB — URINE CULTURE
MICRO NUMBER:: 10061624
SPECIMEN QUALITY:: ADEQUATE

## 2019-08-12 NOTE — Telephone Encounter (Addendum)
UA negative for leukocytes, awaiting urine culture. Mychart message sent to Pt and Arlene informing above and informing that virtual visit will be required for sore throat and other sx's. Asked that they call office to schedule.

## 2019-08-12 NOTE — Telephone Encounter (Signed)
Please advise 

## 2019-08-12 NOTE — Telephone Encounter (Signed)
Shaun Yoder, wife Ph# [phone]  Wife called requesting UA results.  Pt has sore throat again (like he had about 1 month ago). Pt is coughing up yellow/brown mucus. Wife said his chest is rattling. She said the last time Dr. Larose Kells ordered antibiotics. Please advise.

## 2019-08-12 NOTE — Telephone Encounter (Signed)
I need more information before prescribing antibiotics, please set up a virtual visit with me or a partner. I could see him tomorrow after 4 PM virtually. If they feel it is more urgent at that: Urgent care.

## 2019-08-12 NOTE — Telephone Encounter (Signed)
Late entry--Pt's wife Arlene called yesterday morning- Pt wife urinary incontinence and odor. Requesting to drop off urine for infection. Okay per Dr. Larose Kells.

## 2019-08-12 NOTE — Telephone Encounter (Signed)
Appt scheduled tomorrow virtually at 1:40pm.

## 2019-08-13 ENCOUNTER — Telehealth: Payer: Self-pay

## 2019-08-13 ENCOUNTER — Encounter: Payer: Self-pay | Admitting: Internal Medicine

## 2019-08-13 ENCOUNTER — Ambulatory Visit (INDEPENDENT_AMBULATORY_CARE_PROVIDER_SITE_OTHER): Payer: Medicare Other | Admitting: Internal Medicine

## 2019-08-13 DIAGNOSIS — J9610 Chronic respiratory failure, unspecified whether with hypoxia or hypercapnia: Secondary | ICD-10-CM

## 2019-08-13 DIAGNOSIS — J9611 Chronic respiratory failure with hypoxia: Secondary | ICD-10-CM | POA: Diagnosis not present

## 2019-08-13 DIAGNOSIS — R32 Unspecified urinary incontinence: Secondary | ICD-10-CM

## 2019-08-13 MED ORDER — SULFAMETHOXAZOLE-TRIMETHOPRIM 800-160 MG PO TABS: 1.0000 | ORAL_TABLET | Freq: Two times a day (BID) | ORAL | 0 refills | Status: DC

## 2019-08-13 MED ORDER — PREDNISONE 10 MG PO TABS: ORAL_TABLET | ORAL | 0 refills | Status: DC

## 2019-08-13 MED ORDER — ALBUTEROL SULFATE (2.5 MG/3ML) 0.083% IN NEBU: 2.5000 mg | INHALATION_SOLUTION | Freq: Four times a day (QID) | RESPIRATORY_TRACT | 5 refills | Status: DC | PRN

## 2019-08-13 NOTE — Telephone Encounter (Signed)
Oxygen orders faxed to [facility].

## 2019-08-13 NOTE — Progress Notes (Signed)
Subjective:    Patient ID: Shaun Yoder, male    DOB: September 04, 1945, 74 y.o.   MRN: [id number]  DOS:  08/13/2019 Type of visit - description: Virtual Visit via Video Note  I connected with the above patient  by a video enabled telemedicine application and verified that I am speaking with the correct person using two identifiers.   THIS ENCOUNTER IS A VIRTUAL VISIT DUE TO COVID-19 - PATIENT WAS NOT SEEN IN THE OFFICE. PATIENT HAS CONSENTED TO VIRTUAL VISIT / TELEMEDICINE VISIT   Location of patient: home  Location of provider: office  I discussed the limitations of evaluation and management by telemedicine and the availability of in person appointments. The patient expressed understanding and agreed to proceed.  Acute The patient was seen with bronchitis in December, after that visit, he got somewhat better but symptoms resurface this week: Hoarseness, increased chest congestion, cough with yellowish sputum, sore throat. His oxygen is usually in the high 80s low 90s, today it has ranged from 81% to 85%.  Also he recently call with urine incontinence, they requested that urinalysis and a urine culture which showed no specific infection. Denies  fever chills. No dysuria No increase back pain, no saddle anesthesia, no bowel incontinence.  No chest pain No nausea or vomiting     Review of Systems See above   Past Medical History:  Diagnosis Date  . Anemia   . Cancer ([place])    bladder  . Chronic venous insufficiency   . Colitis   . Colon polyps   . Depression   . Diverticulosis   . DVT of deep femoral vein ([place])   . Gastritis   . GERD (gastroesophageal reflux disease)   . History of rectal polyps   . Hyperlipidemia   . Hypertension   . Iron deficiency   . Low back pain   . Lymphedema of both lower extremities   . OA (osteoarthritis)   . 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 Schizoaffective disorder, bipolar type ([place])   . Sleep apnea   . Varicose vein      Past Surgical History:  Procedure Laterality Date  . BLADDER SURGERY    . TOTAL KNEE ARTHROPLASTY Bilateral   . VARICOSE VEIN SURGERY          Objective:   Physical Exam There were no vitals taken for this visit. This is a virtual video visit, he is sitting, alert oriented x3, he is indeed slightly hoarse, some chest congestion noted but no respiratory distress. (At the end of the visit, we switched to telephone visit because poor sound  quality)    Assessment       Assessment   ( transfer from [facility],02-2015) Hypertension Hyperlipidemia Ao stenosis, severe ([place]len[place] Heckle [place] MSK: --Seronegative Rheumatoid arthritis  --DJD-- DR Tamera Punt , dr Ronnie Derby --low back pain d/t DJD, Dr Sherlyn Lick  PSYCH: Schizophrenia, bipolar, depression: Follow-up at the [facility] in [place] GU: Bladder cancer-transitional cell, Dr. Shona Needles GI:   GERD, history of colitis, history of gastritis,  colon polyps- had a virtual cscope 2016, + polyps, declined further eval Pulmomany:   --Sleep apnea:  Cpap intolerant --CT nodules f/u 2 pulmonary  --Chronic respiratory failure with hypoxemia, dx 01-2017, unable to get O2 as of 1-20 21 either from [facility] or the [facility] Hematology: L LEG DVT 02-2016 + Lupus anticoagulant Varicose veins: s/p remote surgery, has LE wounds  H/o  iron deficiency anemia At the [facility]: sees a dentist, PCP,  psych, Ortho, pain mngmt   PLAN Chronic hypoxic respiratory failure with acute bronchitis: Today his O2 sat is 81 to 85%, below his baseline.  Under normal circumstances I will send him to the ER, but [facility] has declined  ER visits before  and he has chronic hypoxia with good tolerance. Plan: Bactrim for the treatment of bronchitis (no Zithromax due to interaction), round of prednisone (after that he can go to his normal 5 mg daily) Continue albuterol puffers Robitussin.. We will ask a home health agency to visit him and see if we can set him up for a nebulizer and finally  getting him some oxygen. Urinary incontinence: With no severe back pain, bowel incontinence or other red flags.  Slightly better, urine culture with no specific infection.  I discussed the assessment and treatment plan with the patient. The patient was provided an opportunity to ask questions and all were answered. The patient agreed with the plan and demonstrated an understanding of the instructions.   The patient was advised to call back or seek an in-person evaluation if the symptoms worsen or if the condition fails to improve as anticipated.

## 2019-08-14 NOTE — Assessment & Plan Note (Signed)
Chronic hypoxic respiratory failure with acute bronchitis: Today his O2 sat is 81 to 85%, below his baseline.  Under normal circumstances I will send him to the ER, but Shaun Yoder has declined  ER visits before  and he has chronic hypoxia with good tolerance. Plan: Bactrim for the treatment of bronchitis (no Zithromax due to interaction), round of prednisone (after that he can go to his normal 5 mg daily) Continue albuterol puffers Robitussin.. We will ask a home health agency to visit him and see if we can set him up for a nebulizer and finally getting him some oxygen. Urinary incontinence: With no severe back pain, bowel incontinence or other red flags.  Slightly better, urine culture with no specific infection.

## 2019-08-17 ENCOUNTER — Ambulatory Visit: Payer: Medicare Other | Admitting: Internal Medicine

## 2019-08-17 DIAGNOSIS — L97821 Non-pressure chronic ulcer of other part of left lower leg limited to breakdown of skin: Secondary | ICD-10-CM | POA: Diagnosis not present

## 2019-08-17 DIAGNOSIS — I890 Lymphedema, not elsewhere classified: Secondary | ICD-10-CM | POA: Diagnosis not present

## 2019-08-17 DIAGNOSIS — L97222 Non-pressure chronic ulcer of left calf with fat layer exposed: Secondary | ICD-10-CM | POA: Diagnosis not present

## 2019-08-17 DIAGNOSIS — I872 Venous insufficiency (chronic) (peripheral): Secondary | ICD-10-CM | POA: Diagnosis not present

## 2019-08-23 DIAGNOSIS — I890 Lymphedema, not elsewhere classified: Secondary | ICD-10-CM | POA: Diagnosis not present

## 2019-08-23 DIAGNOSIS — L97822 Non-pressure chronic ulcer of other part of left lower leg with fat layer exposed: Secondary | ICD-10-CM | POA: Diagnosis not present

## 2019-08-23 DIAGNOSIS — I872 Venous insufficiency (chronic) (peripheral): Secondary | ICD-10-CM | POA: Diagnosis not present

## 2019-08-24 ENCOUNTER — Ambulatory Visit (INDEPENDENT_AMBULATORY_CARE_PROVIDER_SITE_OTHER): Payer: Medicare Other | Admitting: Internal Medicine

## 2019-08-24 ENCOUNTER — Encounter: Payer: Self-pay | Admitting: Internal Medicine

## 2019-08-24 VITALS — BP 103/79 | Temp 96.8°F

## 2019-08-24 DIAGNOSIS — M05719 Rheumatoid arthritis with rheumatoid factor of unspecified shoulder without organ or systems involvement: Secondary | ICD-10-CM

## 2019-08-24 DIAGNOSIS — J9611 Chronic respiratory failure with hypoxia: Secondary | ICD-10-CM

## 2019-08-24 DIAGNOSIS — R32 Unspecified urinary incontinence: Secondary | ICD-10-CM | POA: Diagnosis not present

## 2019-08-24 NOTE — Progress Notes (Signed)
Subjective:    Patient ID: Shaun Yoder, male    DOB: June 02, 1946, 74 y.o.   MRN: [id number]  DOS:  08/24/2019 Type of visit - description: Virtual Visit via Video Note  I connected with the above patient  by a video enabled telemedicine application and verified that I am speaking with the correct person using two identifiers.   THIS ENCOUNTER IS A VIRTUAL VISIT DUE TO COVID-19 - PATIENT WAS NOT SEEN IN THE OFFICE. PATIENT HAS CONSENTED TO VIRTUAL VISIT / TELEMEDICINE VISIT   Location of patient: home  Location of provider: office  I discussed the limitations of evaluation and management by telemedicine and the availability of in person appointments. The patient expressed understanding and agreed to proceed.  Follow-up Since the last office visit, he took antibiotics for acute on chronic respiratory failure due to bronchitis. He has definitely improved. Today we also addressed a number of other issues.    Review of Systems At this point he reports decreased cough and chest congestion. No fever chills L UTS improved.   Past Medical History:  Diagnosis Date  . Anemia   . Cancer ([place])    bladder  . Chronic venous insufficiency   . Colitis   . Colon polyps   . Depression   . Diverticulosis   . DVT of deep femoral vein ([place])   . Gastritis   . GERD (gastroesophageal reflux disease)   . History of rectal polyps   . Hyperlipidemia   . Hypertension   . Iron deficiency   . Low back pain   . Lymphedema of both lower extremities   . OA (osteoarthritis)   . Osteonecrosis of shoulder region ([place])   . Schizoaffective disorder, bipolar type ([place])   . Sleep apnea   . Varicose vein     Past Surgical History:  Procedure Laterality Date  . BLADDER SURGERY    . TOTAL KNEE ARTHROPLASTY Bilateral   . VARICOSE VEIN SURGERY          Objective:   Physical Exam BP 103/79   Temp (!) 96.8 F (36 C)   SpO2 (!) 89%  This is a virtual video visit, he is alert oriented x3,  speaking in complete sentences, he seems to be better than the last time we talk.    Assessment      Assessment   ( transfer from [facility],02-2015) Hypertension Hyperlipidemia Ao stenosis, severe ([place] Olena Heckle [place] MSK: --Seronegative Rheumatoid arthritis  --DJD-- DR Tamera Punt , dr Ronnie Derby --low back pain d/t DJD, Dr Sherlyn Lick  PSYCH: Schizophrenia, bipolar, depression: Follow-up at the [facility] in [place] GU: Bladder cancer-transitional cell, Dr. Shona Needles GI:   GERD, history of colitis, history of gastritis,  colon polyps- had a virtual cscope 2016, + polyps, declined further eval Pulmomany:   --Sleep apnea:  Cpap intolerant --CT nodules f/u 2 pulmonary  --Chronic respiratory failure with hypoxemia, dx 01-2017, unable to get O2 as of 1-20 21 either from [facility] or the [facility] Hematology: L LEG DVT 02-2016 + Lupus anticoagulant Varicose veins: s/p remote surgery, has LE wounds  H/o  iron deficiency anemia At the [facility]: sees a dentist, PCP, psych, Ortho, pain mngmt   PLAN Chronic hypoxic respiratory failure: Was recently sick with bronchitis and acute exacerbation, he took Bactrim and prednisone he is now improved. O2 sat remains 89% and again he has not been able to get oxygen supplements.  We will try again with different agency than [facility] if possible. The wife eventually  got [place]bul[place] and he has been doing nebs QID  with great benefit.  For now on his okay to use the nebulizer 4 times daily as needed. Urinary incontinence: Improved Shoulder pain (fracture?)  [facility] to have [facility] soon Rheumatoid arthritis: Sees Dr. Maudie Mercury at the [facility], he is back on Humira RTC: He sees a number of doctors at the [facility], he could see me as needed but he likes to have a regular visit in 6 months.  Will arrange    I discussed the assessment and treatment plan with the patient. The patient was provided an opportunity to ask questions and all were answered. The patient agreed with  the plan and demonstrated an understanding of the instructions.   The patient was advised to call back or seek an in-person evaluation if the symptoms worsen or if the condition fails to improve as anticipated.

## 2019-08-25 NOTE — Assessment & Plan Note (Signed)
Chronic hypoxic respiratory failure: Was recently sick with bronchitis and acute exacerbation, Shaun Yoder took Bactrim and prednisone Shaun Yoder is now improved. O2 sat remains 89% and again Shaun Yoder has not been able to get oxygen supplements.  We will try again with different agency than [facility] if possible. Shaun Yoder eventually got [place] and Shaun Yoder has been doing nebs QID  with great benefit.  For now on Shaun Yoder okay to use Shaun nebulizer 4 times daily as needed. Urinary incontinence: Improved Shoulder pain (fracture?)  [facility], to have further x-rays soon Rheumatoid arthritis: Sees Dr. Maudie Mercury at [facility], Shaun Yoder is back on Humira RTC: Shaun Yoder sees a number of doctors at [facility] [place], Shaun Yoder could see me as needed but Shaun Yoder likes to have a regular visit in 6 months.  Will arrange

## 2019-08-26 NOTE — Telephone Encounter (Signed)
Received fax confirmation from [facility].

## 2019-08-26 NOTE — Telephone Encounter (Addendum)
Spoke w/ Pt's wife on 08/24/19- they have not heard from [facility] regarding O2 orders. Will try [facility]. Orders faxed to [phone].

## 2019-08-27 NOTE — Telephone Encounter (Signed)
Received call today from Jeneen Rinks at [facility]- unable to do Oxygen therapy for Pt- he informed Pt must go through [facility]. Urgent O2 orders faxed to [facility] at [phone].

## 2019-08-30 DIAGNOSIS — I890 Lymphedema, not elsewhere classified: Secondary | ICD-10-CM | POA: Diagnosis not present

## 2019-08-30 DIAGNOSIS — L97921 Non-pressure chronic ulcer of unspecified part of left lower leg limited to breakdown of skin: Secondary | ICD-10-CM | POA: Diagnosis not present

## 2019-08-30 DIAGNOSIS — L97822 Non-pressure chronic ulcer of other part of left lower leg with fat layer exposed: Secondary | ICD-10-CM | POA: Diagnosis not present

## 2019-08-30 DIAGNOSIS — L97821 Non-pressure chronic ulcer of other part of left lower leg limited to breakdown of skin: Secondary | ICD-10-CM | POA: Diagnosis not present

## 2019-08-30 DIAGNOSIS — L97412 Non-pressure chronic ulcer of right heel and midfoot with fat layer exposed: Secondary | ICD-10-CM | POA: Diagnosis not present

## 2019-08-30 DIAGNOSIS — I872 Venous insufficiency (chronic) (peripheral): Secondary | ICD-10-CM | POA: Diagnosis not present

## 2019-08-30 NOTE — Progress Notes (Signed)
Virtual Visit via Audio Note  I connected with patient on 08/31/19 at  2:00 PM EST by audio enabled telemedicine application and verified that I am speaking with the correct person using two identifiers.   THIS ENCOUNTER IS A VIRTUAL VISIT DUE TO COVID-19 - PATIENT WAS NOT SEEN IN THE OFFICE. PATIENT HAS CONSENTED TO VIRTUAL VISIT / TELEMEDICINE VISIT   Location of patient: home  Location of provider: office  I discussed the limitations of evaluation and management by telemedicine and the availability of in person appointments. The patient expressed understanding and agreed to proceed.   Subjective:   Shaun Yoder is a 74 y.o. male who presents for Medicare Annual/Subsequent preventive examination.  Review of Systems:  Home Safety/Smoke Alarms: Feels safe in home. Smoke alarms in place.  Pt lives w/ wife in 1 story home. Ramp going into home. Uses electric wheelchair. Has conversion van. 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  Male:   CCS-  11/10/14    PSA-  Lab Results  Component Value Date   PSA 0.409 03/27/2017   PSA 0.505 10/19/2015   PSA 1.10 Test Methodology: [employer] PSA 03/17/2009       Objective:    Vitals: Unable to assess. This visit is enabled though telemedicine due to Covid 19.   Advanced Directives 08/31/2019 08/08/2019 11/24/2018 08/28/2018 03/20/2018 12/26/2017 12/25/2017  Does Patient Have a Medical Advance Directive? Yes No Yes Yes Yes Yes No  Type of Paramedic of [place];Living will - Living will;Healthcare Power of [place];Living will Healthcare Power of [place] -  Does patient want to make changes to medical advance directive? No - Patient declined - No - Patient declined No - Patient declined No - Patient declined No - Patient declined -  Copy of [place] in Chart? Yes - validated most recent copy scanned  in chart (See row information) - - Yes - validated most recent copy scanned in chart (See row information) Yes - -  Would patient like information on creating a medical advance directive? - - - - - - -    Tobacco Social History   Tobacco Use  Smoking Status Former Smoker  . Packs/day: 2.50  . Years: 43.00  . Pack years: 107.50  . Types: Cigarettes  . Quit date: 07/22/1998  . Years since quitting: 21.1  Smokeless Tobacco Never Used     Counseling given: Not Answered   Clinical Intake: Pain : No/denies pain     Past Medical History:  Diagnosis Date  . Anemia   . Cancer ([place])    bladder  . Chronic venous insufficiency   . Colitis   . Colon polyps   . Depression   . Diverticulosis   . DVT of deep femoral vein ([place])   . Gastritis   . GERD (gastroesophageal reflux disease)   . History of rectal polyps   . Hyperlipidemia   . Hypertension   . Iron deficiency   . Low back pain   . Lymphedema of both lower extremities   . OA (osteoarthritis)   . 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 Schizoaffective disorder, bipolar type ([place])   . Sleep apnea   . Varicose vein    Past Surgical History:  Procedure Laterality Date  . BLADDER SURGERY    . TOTAL KNEE ARTHROPLASTY Bilateral   .  VARICOSE VEIN SURGERY     Family History  Problem Relation Age of Onset  . Heart attack Mother   . Heart attack Father   . Stroke Father   . Rheumatologic disease Neg Hx   . Colon cancer Neg Hx   . Lung disease Neg Hx   . Prostate cancer Neg Hx    Social History   Socioeconomic History  . Marital status: Married    Spouse name: Not on file  . Number of children: 2  . Years of education: Not on file  . Highest education level: Not on file  Occupational History  . Occupation: retired  Tobacco Use  . Smoking status: Former Smoker    Packs/day: 2.50    Years: 43.00    Pack years: 107.50    Types: Cigarettes    Quit date: 07/22/1998    Years since quitting: 21.1  . Smokeless  tobacco: Never Used  Substance and Sexual Activity  . Alcohol use: Not Currently    Alcohol/week: 0.0 standard drinks  . Drug use: No  . Sexual activity: Not Currently  Other Topics Concern  . Not on file  Social History Narrative   Originally from [place]. Moved to [facility] in July 22, 1985. 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 No known asbestos exposure. 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 No international travel. Has a dog currently. Remote exposure to Cockatiel in a different home. No mold exposure. No hot tub exposure.       Two adopted children   Social Determinants of Health   Financial Resource Strain:   . Difficulty of Paying Living Expenses: Not on file  Food Insecurity:   . Worried About Charity fundraiser in the Last Year: Not on file  . Ran Out of Food in the Last Year: Not on file  Transportation Needs:   . Lack of Transportation (Medical): Not on file  . Lack of Transportation (Non-Medical): Not on file  Physical Activity:   . Days of Exercise per Week: Not on file  . Minutes of Exercise per Session: Not on file  Stress:   . Feeling of Stress : Not on file  Social Connections:   . Frequency of Communication with Friends and Family: Not on file  . Frequency of Social Gatherings with Friends and Family: Not on file  . Attends Religious Services: Not on file  . Active Member of Clubs or Organizations: Not on file  . Attends Archivist Meetings: Not on file  . Marital Status: Not on file    Outpatient Encounter Medications as of 08/31/2019  Medication Sig  . albuterol (PROVENTIL) (2.5 MG/3ML) 0.083% nebulizer solution Take 3 mLs (2.5 mg total) by nebulization 4 (four) times daily as needed for wheezing or shortness of breath.  [name] albuterol (VENTOLIN HFA) 108 (90 Base) MCG/ACT inhaler Inhale 2 puffs  into the lungs every 6 (six) hours as needed for wheezing or shortness of breath.  [name] apixaban (ELIQUIS) 5 MG TABS tablet Take 2.5 mg by mouth 2 (two) times daily.  . ARIPiprazole (ABILIFY) 30 MG tablet Take 30 mg by mouth at bedtime.    [name] aspirin EC 81 MG tablet Take 81 mg by mouth daily.  [name] augmented betamethasone dipropionate (DIPROLENE-AF) 0.05 % cream Apply topically 2 (two) times daily. Apply to the  rash of the right side of the face  . calcium-vitamin D (CALCIUM 500/D) 500-200 MG-UNIT tablet Take 1 tablet by mouth daily with breakfast.   . clotrimazole (LOTRIMIN) 1 % cream Apply 1 application topically daily as needed.  . divalproex (DEPAKOTE ER) 500 MG 24 hr tablet Take 1,000 mg by mouth daily. 3 tabs at bedtime  . divalproex (DEPAKOTE) 250 MG DR tablet Take 250 mg by mouth daily with lunch.   . folic acid (FOLVITE) 1 MG tablet Take 1 mg by mouth daily.    [name] ketoconazole (NIZORAL) 2 % cream Apply 1 application topically daily.  . mirabegron ER (MYRBETRIQ) 50 MG TB24 tablet Take 50 mg by mouth daily.  . Omega-3 1000 MG CAPS Take 1 capsule by mouth daily.   [name] omeprazole (PRILOSEC) 20 MG capsule Take 20 mg by mouth every morning.   [name] oxybutynin (DITROPAN) 5 MG tablet Take 5 mg by mouth 3 (three) times daily.   [name] oxyCODONE-acetaminophen (PERCOCET/ROXICET) 5-325 MG tablet Take 1 tablet by mouth every 6 (six) hours as needed for moderate pain.  . predniSONE (DELTASONE) 5 MG tablet Take 5 mg by mouth daily with breakfast.  . triamcinolone cream (KENALOG) 0.1 % Apply 1 application topically 2 (two) times daily.  . vitamin C (ASCORBIC ACID) 500 MG tablet Take 500 mg by mouth daily.  . ziprasidone (GEODON) 20 MG capsule Take 20 mg by mouth daily at 12 noon.  . ziprasidone (GEODON) 80 MG capsule Take 80 mg by mouth at bedtime.   . Adalimumab (HUMIRA) 40 MG/0.4ML PSKT Inject into the skin.  . [DISCONTINUED] Melatonin 3 MG TABS Take 3 mg by mouth at bedtime.  . [DISCONTINUED] predniSONE (DELTASONE)  10 MG tablet 4 tablets x 2 days, 3 tabs x 2 days, 2 tabs x 2 days, 1 tab x 2 days   No facility-administered encounter medications on file as of 08/31/2019.    Activities of Daily Living In your present state of health, do you have any difficulty performing the following activities: 08/31/2019  Hearing? N  Vision? N  Difficulty concentrating or making decisions? Y  Walking or climbing stairs? Y  Dressing or bathing? Y  Doing errands, shopping? Y  Preparing Food and eating ? Y  Using the Toilet? Y  In the past six months, have you accidently leaked urine? Y  Comment wears depends  Do you have problems with loss of bowel control? Y  Comment depends  Managing your Medications? Y  Managing your Finances? Y  Housekeeping or managing your Housekeeping? Y  Some recent data might be hidden    Patient Care Team: Colon Branch, MD as PCP - General (Internal Medicine) [name], MD as Consulting Physician (Rheumatology) Sherren Mocha, MD as Consulting Physician (Cardiology) Javier Glazier, MD as Consulting Physician (Pulmonary Disease) Bea Laura, MD as Consulting Physician (Internal Medicine) Franchot Gallo, MD as Consulting Physician (Urology) Vickey Huger, MD as Consulting Physician (Orthopedic Surgery) [name], MD as Consulting Physician (Oncology) Vickii Chafe., DO as Referring Physician (Cardiology)   Assessment:   This is a routine wellness examination for [place]. Physical assessment deferred to PCP.   Exercise Activities and Dietary recommendations Current Exercise Habits: The patient does not participate in regular exercise at present, Exercise limited by: neurologic condition(s)   Diet (meal preparation, eat out, water intake, caffeinated beverages, dairy products, fruits and vegetables): well balanced  Goals    . DIET - EAT MORE FRUITS AND VEGETABLES  Fall Risk Fall Risk  08/31/2019 01/27/2019 08/28/2018 02/20/2018 01/05/2018  Falls in the past  year? 0 1 1 No Exclusion - non ambulatory  Comment - - - Emmi Telephone Survey: data to providers prior to load -  Number falls in past yr: 0 1 1 - -  Injury with Fall? 0 - 0 - -  Risk Factor Category  - - - - -  Risk for fall due to : Impaired mobility;Impaired balance/gait - Impaired balance/gait;Impaired mobility;History of fall(s) - -  Risk for fall due to: Comment - - - - -  Follow up Education provided;Falls prevention discussed - - - -   Depression Screen PHQ 2/9 Scores 08/31/2019 02/17/2019 11/24/2018 09/16/2018  PHQ - 2 Score 0 2 1 0  PHQ- 9 Score - 14 - 2    Cognitive Function   MMSE - Mini Mental State Exam 08/31/2019 08/28/2018 10/24/2016 10/24/2015  Not completed: Refused - - -  Orientation to time - 5 5 5   Orientation to Place - 5 5 5   Registration - 3 3 3   Attention/ Calculation - 5 5 5   Recall - 2 1 2   Language- name 2 objects - 2 2 2   Language- repeat - 1 1 1   Language- follow 3 step command - 3 3 3   Language- read & follow direction - 1 1 1   Write a sentence - 1 1 1   Copy design - 1 1 1   Total score - 29 28 29         Immunization History  Administered Date(s) Administered  . Fluad Quad(high Dose 65+) 03/25/2019  . Influenza Split 04/12/2017  . Influenza, High Dose Seasonal PF 05/23/2015, 04/03/2016  . Influenza-Unspecified 04/21/2014  . Pneumococcal Conjugate-13 04/21/2014  . Pneumococcal Polysaccharide-23 05/23/2015  . Tdap 11/19/2010  . Zoster Recombinat (Shingrix) 03/25/2019, 06/04/2019    Screening Tests Health Maintenance  Topic Date Due  . TETANUS/TDAP  11/18/2020  . COLONOSCOPY  11/09/2024  . INFLUENZA VACCINE  Completed  . Hepatitis C Screening  Completed  . PNA vac Low Risk Adult  Completed        Plan:    Please schedule your next medicare wellness visit with me in 1 yr.  Continue to eat heart healthy diet (full of fruits, vegetables, whole grains, lean protein, water--limit salt, fat, and sugar intake) and increase physical activity as  tolerated.  Continue doing brain stimulating activities (puzzles, reading, adult coloring books, staying active) to keep memory sharp.     I have personally reviewed and noted the following in the patient's chart:   . Medical and social history . Use of alcohol, tobacco or illicit drugs  . Current medications and supplements . Functional ability and status . Nutritional status . Physical activity . Advanced directives . List of other physicians . Hospitalizations, surgeries, and ER visits in previous 12 months . Vitals . Screenings to include cognitive, depression, and falls . Referrals and appointments  In addition, I have reviewed and discussed with patient certain preventive protocols, quality metrics, and best practice recommendations. A written personalized care plan for preventive services as well as general preventive health recommendations were provided to patient.     Naaman Plummer [place], [place]  08/31/2019

## 2019-08-31 ENCOUNTER — Other Ambulatory Visit: Payer: Self-pay

## 2019-08-31 ENCOUNTER — Ambulatory Visit (INDEPENDENT_AMBULATORY_CARE_PROVIDER_SITE_OTHER): Payer: Medicare Other | Admitting: *Deleted

## 2019-08-31 ENCOUNTER — Encounter: Payer: Self-pay | Admitting: *Deleted

## 2019-08-31 DIAGNOSIS — Z0000 Encounter for general adult medical examination without abnormal findings: Secondary | ICD-10-CM

## 2019-08-31 NOTE — Patient Instructions (Signed)
Please schedule your next medicare wellness visit with me in 1 yr.  Continue to eat heart healthy diet (full of fruits, vegetables, whole grains, lean protein, water--limit salt, fat, and sugar intake) and increase physical activity as tolerated.  Continue doing brain stimulating activities (puzzles, reading, adult coloring books, staying active) to keep memory sharp.    Shaun Yoder , Thank you for taking time to come for your Medicare Wellness Visit. I appreciate your ongoing commitment to your health goals. Please review the following plan we discussed and let me know if I can assist you in the future.   These are the goals we discussed: Goals    . DIET - EAT MORE FRUITS AND VEGETABLES       This is a list of the screening recommended for you and due dates:  Health Maintenance  Topic Date Due  . Tetanus Vaccine  11/18/2020  . Colon Cancer Screening  11/09/2024  . Flu Shot  Completed  .  Hepatitis C: One time screening is recommended by Center for Disease Control  (CDC) for  adults born from [birth year] through 1965.   Completed  . Pneumonia vaccines  Completed    Preventive Care 74 Years and Older, Male Preventive care refers to lifestyle choices and visits with your health care provider that can promote health and wellness. This includes:  A yearly physical exam. This is also called an annual well check.  Regular dental and eye exams.  Immunizations.  Screening for certain conditions.  Healthy lifestyle choices, such as diet and exercise. What can I expect for my preventive care visit? Physical exam Your health care provider will check:  Height and weight. These may be used to calculate body mass index (BMI), which is a measurement that tells if you are at a healthy weight.  Heart rate and blood pressure.  Your skin for abnormal spots. Counseling Your health care provider may ask you questions about:  Alcohol, tobacco, and drug use.  Emotional well-being.  Home and  relationship well-being.  Sexual activity.  Eating habits.  History of falls.  Memory and ability to understand (cognition).  Work and work Statistician. What immunizations do I need?  Influenza (flu) vaccine  This is recommended every year. Tetanus, diphtheria, and pertussis (Tdap) vaccine  You may need a Td booster every 10 years. Varicella (chickenpox) vaccine  You may need this vaccine if you have not already been vaccinated. Zoster (shingles) vaccine  You may need this after age 57. Pneumococcal conjugate (PCV13) vaccine  One dose is recommended after age 74. Pneumococcal polysaccharide (PPSV23) vaccine  One dose is recommended after age 74. Measles, mumps, and rubella (MMR) vaccine  You may need at least one dose of MMR if you were born in 1957 or later. You may also need a second dose. Meningococcal conjugate (MenACWY) vaccine  You may need this if you have certain conditions. Hepatitis A vaccine  You may need this if you have certain conditions or if you travel or work in places where you may be exposed to hepatitis A. Hepatitis B vaccine  You may need this if you have certain conditions or if you travel or work in places where you may be exposed to hepatitis B. Haemophilus influenzae type b (Hib) vaccine  You may need this if you have certain conditions. You may receive vaccines as individual doses or as more than one vaccine together in one shot (combination vaccines). Talk with your health care provider about the risks and  benefits of combination vaccines. What tests do I need? Blood tests  Lipid and cholesterol levels. These may be checked every 5 years, or more frequently depending on your overall health.  Hepatitis C test.  Hepatitis B test. Screening  Lung cancer screening. You may have this screening every year starting at age 74 if you have a 30-pack-year history of smoking and currently smoke or have quit within the past 15  years.  Colorectal cancer screening. All adults should have this screening starting at age 74 and continuing until age 74. Your health care provider may recommend screening at age 74 if you are at increased risk. You will have tests every 1-10 years, depending on your results and the type of screening test.  Prostate cancer screening. Recommendations will vary depending on your family history and other risks.  Diabetes screening. This is done by checking your blood sugar (glucose) after you have not eaten for a while (fasting). You may have this done every 1-3 years.  Abdominal aortic aneurysm (AAA) screening. You may need this if you are a current or former smoker.  Sexually transmitted disease (STD) testing. Follow these instructions at home: Eating and drinking  Eat a diet that includes fresh fruits and vegetables, whole grains, lean protein, and low-fat dairy products. Limit your intake of foods with high amounts of sugar, saturated fats, and salt.  Take vitamin and mineral supplements as recommended by your health care provider.  Do not drink alcohol if your health care provider tells you not to drink.  If you drink alcohol: ? Limit how much you have to 0-2 drinks a day. ? Be aware of how much alcohol is in your drink. In the [place] one drink equals one 12 oz bottle of beer (355 mL), one 5 oz glass of wine (148 mL), or one 1 oz glass of hard liquor (44 mL). Lifestyle  Take daily care of your teeth and gums.  Stay active. Exercise for at least 30 minutes on 5 or more days each week.  Do not use any products that contain nicotine or tobacco, such as cigarettes, e-cigarettes, and chewing tobacco. If you need help quitting, ask your health care provider.  If you are sexually active, practice safe sex. Use a condom or other form of protection to prevent STIs (sexually transmitted infections).  Talk with your health care provider about taking a low-dose aspirin or statin. What's  next?  Visit your health care provider once a year for a well check visit.  Ask your health care provider how often you should have your eyes and teeth checked.  Stay up to date on all vaccines. This information is not intended to replace advice given to you by your health care provider. Make sure you discuss any questions you have with your health care provider. Document Revised: 07/02/2018 Document Reviewed: 07/02/2018 Elsevier Patient Education  2020 [employer].

## 2019-09-01 ENCOUNTER — Telehealth: Payer: Self-pay | Admitting: Internal Medicine

## 2019-09-01 NOTE — Telephone Encounter (Signed)
Please advise 

## 2019-09-01 NOTE — Telephone Encounter (Signed)
Spoke w/ Arlene-Pt's wife- Pt is frustrated w/ having Unna boot on for 4 weeks- he saw wound care last week and they said it would need to remain maybe 1 more week. Arlene was able to talk Pt down after he called earlier today. They will stay w/ current wound care center. Arlene also informed that [employer] delivered the oxygen ordered on Saturday 08/28/2019.

## 2019-09-01 NOTE — Telephone Encounter (Signed)
Patient would like a referral or someone to take boot off.Shaun Yoder put on at wound care([facility] Patient would like another Tax adviser.

## 2019-09-01 NOTE — Telephone Encounter (Signed)
Switching wound care doctors in the middle of the treatment is not a good idea.  Please advised patient to call back Shaun Yoder and make an appointment.

## 2019-09-03 ENCOUNTER — Telehealth: Payer: Self-pay

## 2019-09-03 NOTE — Telephone Encounter (Signed)
Certificate of Medical Necessity for Oxygen form completed and faxed back to [place]- form sent for scanning. Received fax confirmation.

## 2019-09-14 DIAGNOSIS — M79672 Pain in left foot: Secondary | ICD-10-CM | POA: Diagnosis not present

## 2019-09-14 DIAGNOSIS — B351 Tinea unguium: Secondary | ICD-10-CM | POA: Diagnosis not present

## 2019-09-14 DIAGNOSIS — M79671 Pain in right foot: Secondary | ICD-10-CM | POA: Diagnosis not present

## 2019-10-04 ENCOUNTER — Telehealth: Payer: Self-pay | Admitting: Internal Medicine

## 2019-10-04 DIAGNOSIS — M05719 Rheumatoid arthritis with rheumatoid factor of unspecified shoulder without organ or systems involvement: Secondary | ICD-10-CM

## 2019-10-04 DIAGNOSIS — F250 Schizoaffective disorder, bipolar type: Secondary | ICD-10-CM

## 2019-10-04 DIAGNOSIS — I10 Essential (primary) hypertension: Secondary | ICD-10-CM

## 2019-10-04 DIAGNOSIS — J9611 Chronic respiratory failure with hypoxia: Secondary | ICD-10-CM

## 2019-10-04 NOTE — Telephone Encounter (Signed)
LMOM asked for a call back 

## 2019-10-04 NOTE — Telephone Encounter (Signed)
Please advise 

## 2019-10-04 NOTE — Telephone Encounter (Signed)
CallerSharyn Lull, [facility]  Telephone # [phone] Fax: [phone]  Concern: Patient wife is requesting hospice care. Per Sharyn Lull  With hospice , please advise if Dr Larose Kells is in agreement with this decision and whether if  Dr Larose Kells will remain PCP or turn over obligation to Hospice. If in agreement please send a referral to fax to number listed above.

## 2019-10-04 NOTE — Telephone Encounter (Signed)
Spoke with Shaun Yoder [phone]), the referral was requested by the patient's wife, the patient is getting weaker, continue with hypoxia. I agree with the need for hospice to be involved I will remain the attending Will fax referral to [phone]

## 2019-10-04 NOTE — Telephone Encounter (Signed)
Referral placed to [place].

## 2019-10-11 ENCOUNTER — Telehealth: Payer: Self-pay | Admitting: *Deleted

## 2019-10-11 NOTE — Telephone Encounter (Signed)
Return Phone Number [phone] (Primary) Chief Complaint NUMBNESS/TINGLING- sudden on one side of the body or face Reason for Call Symptomatic / Request for [place] states his left side is numb. He has neck pain up to his temple. He says it has been going on for about 1 week and a half. He is concerned with a stroke Translation No Nurse Assessment Nurse: [name], RN, [name] Date/Time [name] Time): 10/08/2019 4:33:07 PM Confirm and document reason for call. If symptomatic, describe symptoms. ---Caller states his left side is numb. He has neck pain up to his temple. He says it has been going on for about 1 week and a half. He is concerned with a stroke. Caller now denies numbness. Difficulty turning his head, rating pain 4-5 out of 10.  Disp. Time [name] Time) Disposition Final User 10/08/2019 4:31:28 PM Send to Urgent [name] 10/08/2019 4:53:54 PM See PCP within 24 Hours Yes [name], RN, [name]

## 2019-10-11 NOTE — Telephone Encounter (Signed)
Tried to call and schedule appointment.  No answer/ no vm

## 2019-10-13 DIAGNOSIS — L89131 Pressure ulcer of right lower back, stage 1: Secondary | ICD-10-CM | POA: Diagnosis not present

## 2019-10-13 DIAGNOSIS — L97121 Non-pressure chronic ulcer of left thigh limited to breakdown of skin: Secondary | ICD-10-CM | POA: Diagnosis not present

## 2019-10-13 DIAGNOSIS — L97111 Non-pressure chronic ulcer of right thigh limited to breakdown of skin: Secondary | ICD-10-CM | POA: Diagnosis not present

## 2019-10-19 ENCOUNTER — Telehealth: Payer: Self-pay | Admitting: Internal Medicine

## 2019-10-19 DIAGNOSIS — M05719 Rheumatoid arthritis with rheumatoid factor of unspecified shoulder without organ or systems involvement: Secondary | ICD-10-CM

## 2019-10-19 DIAGNOSIS — I10 Essential (primary) hypertension: Secondary | ICD-10-CM

## 2019-10-19 DIAGNOSIS — R269 Unspecified abnormalities of gait and mobility: Secondary | ICD-10-CM

## 2019-10-19 DIAGNOSIS — F250 Schizoaffective disorder, bipolar type: Secondary | ICD-10-CM

## 2019-10-19 NOTE — Telephone Encounter (Signed)
Please advise 

## 2019-10-19 NOTE — Telephone Encounter (Signed)
I agree with recommendations.

## 2019-10-19 NOTE — Telephone Encounter (Signed)
[name] from [facility] called requesting for patient to have PT for Strength and Exercise of legs.. She states he also needs a Firm Mattress or Gel Overlay or Air mattress to prevent skin break down

## 2019-10-19 NOTE — Telephone Encounter (Signed)
Spoke w/ [name]- they do not provide the PT- home health agency needed. Order placed. Mychart message sent to Pt and Pt's wife to see if they are interested in hosp bed w/ gel overlay.

## 2019-10-20 DIAGNOSIS — L89132 Pressure ulcer of right lower back, stage 2: Secondary | ICD-10-CM | POA: Diagnosis not present

## 2019-10-20 DIAGNOSIS — L97121 Non-pressure chronic ulcer of left thigh limited to breakdown of skin: Secondary | ICD-10-CM | POA: Diagnosis not present

## 2019-10-20 DIAGNOSIS — L89131 Pressure ulcer of right lower back, stage 1: Secondary | ICD-10-CM | POA: Diagnosis not present

## 2019-10-20 DIAGNOSIS — I872 Venous insufficiency (chronic) (peripheral): Secondary | ICD-10-CM | POA: Diagnosis not present

## 2019-10-20 DIAGNOSIS — L97111 Non-pressure chronic ulcer of right thigh limited to breakdown of skin: Secondary | ICD-10-CM | POA: Diagnosis not present

## 2019-10-28 DIAGNOSIS — M4726 Other spondylosis with radiculopathy, lumbar region: Secondary | ICD-10-CM | POA: Diagnosis not present

## 2019-10-28 DIAGNOSIS — I83009 Varicose veins of unspecified lower extremity with ulcer of unspecified site: Secondary | ICD-10-CM | POA: Diagnosis not present

## 2019-10-28 DIAGNOSIS — G8929 Other chronic pain: Secondary | ICD-10-CM | POA: Diagnosis not present

## 2019-10-28 DIAGNOSIS — E785 Hyperlipidemia, unspecified: Secondary | ICD-10-CM | POA: Diagnosis not present

## 2019-10-28 DIAGNOSIS — I10 Essential (primary) hypertension: Secondary | ICD-10-CM | POA: Diagnosis not present

## 2019-10-28 DIAGNOSIS — I350 Nonrheumatic aortic (valve) stenosis: Secondary | ICD-10-CM | POA: Diagnosis not present

## 2019-10-28 DIAGNOSIS — M87819 Other osteonecrosis, unspecified shoulder: Secondary | ICD-10-CM | POA: Diagnosis not present

## 2019-10-28 DIAGNOSIS — Z7901 Long term (current) use of anticoagulants: Secondary | ICD-10-CM | POA: Diagnosis not present

## 2019-10-28 DIAGNOSIS — M05719 Rheumatoid arthritis with rheumatoid factor of unspecified shoulder without organ or systems involvement: Secondary | ICD-10-CM | POA: Diagnosis not present

## 2019-10-28 DIAGNOSIS — F250 Schizoaffective disorder, bipolar type: Secondary | ICD-10-CM | POA: Diagnosis not present

## 2019-10-28 DIAGNOSIS — I82412 Acute embolism and thrombosis of left femoral vein: Secondary | ICD-10-CM | POA: Diagnosis not present

## 2019-10-28 DIAGNOSIS — Z6841 Body Mass Index (BMI) 40.0 and over, adult: Secondary | ICD-10-CM | POA: Diagnosis not present

## 2019-10-28 DIAGNOSIS — M1990 Unspecified osteoarthritis, unspecified site: Secondary | ICD-10-CM | POA: Diagnosis not present

## 2019-10-28 DIAGNOSIS — I890 Lymphedema, not elsewhere classified: Secondary | ICD-10-CM | POA: Diagnosis not present

## 2019-10-28 DIAGNOSIS — Z7982 Long term (current) use of aspirin: Secondary | ICD-10-CM | POA: Diagnosis not present

## 2019-10-28 DIAGNOSIS — M25552 Pain in left hip: Secondary | ICD-10-CM | POA: Diagnosis not present

## 2019-10-29 ENCOUNTER — Emergency Department (HOSPITAL_BASED_OUTPATIENT_CLINIC_OR_DEPARTMENT_OTHER)
Admission: EM | Admit: 2019-10-29 | Discharge: 2019-10-29 | Disposition: A | Discharge status: Home / Self Care | Payer: Medicare Other | Attending: Emergency Medicine | Admitting: Emergency Medicine

## 2019-10-29 ENCOUNTER — Other Ambulatory Visit: Payer: Self-pay

## 2019-10-29 ENCOUNTER — Encounter (HOSPITAL_BASED_OUTPATIENT_CLINIC_OR_DEPARTMENT_OTHER): Payer: Self-pay | Admitting: *Deleted

## 2019-10-29 ENCOUNTER — Telehealth: Payer: Self-pay

## 2019-10-29 ENCOUNTER — Ambulatory Visit (INDEPENDENT_AMBULATORY_CARE_PROVIDER_SITE_OTHER): Payer: Medicare Other | Admitting: Medical

## 2019-10-29 ENCOUNTER — Emergency Department (HOSPITAL_BASED_OUTPATIENT_CLINIC_OR_DEPARTMENT_OTHER): Payer: Medicare Other

## 2019-10-29 ENCOUNTER — Telehealth: Payer: Self-pay | Admitting: Internal Medicine

## 2019-10-29 ENCOUNTER — Encounter: Payer: Self-pay | Admitting: Medical

## 2019-10-29 ENCOUNTER — Inpatient Hospital Stay (HOSPITAL_BASED_OUTPATIENT_CLINIC_OR_DEPARTMENT_OTHER): Admission: RE | Admit: 2019-10-29 | Payer: Medicare Other | Source: Ambulatory Visit

## 2019-10-29 VITALS — BP 110/66

## 2019-10-29 DIAGNOSIS — I10 Essential (primary) hypertension: Secondary | ICD-10-CM | POA: Diagnosis not present

## 2019-10-29 DIAGNOSIS — Z885 Allergy status to narcotic agent status: Secondary | ICD-10-CM | POA: Diagnosis not present

## 2019-10-29 DIAGNOSIS — R05 Cough: Secondary | ICD-10-CM | POA: Diagnosis not present

## 2019-10-29 DIAGNOSIS — R0602 Shortness of breath: Secondary | ICD-10-CM | POA: Diagnosis present

## 2019-10-29 DIAGNOSIS — R058 Other specified cough: Secondary | ICD-10-CM

## 2019-10-29 DIAGNOSIS — E785 Hyperlipidemia, unspecified: Secondary | ICD-10-CM | POA: Insufficient documentation

## 2019-10-29 DIAGNOSIS — Z881 Allergy status to other antibiotic agents status: Secondary | ICD-10-CM | POA: Diagnosis not present

## 2019-10-29 DIAGNOSIS — J40 Bronchitis, not specified as acute or chronic: Secondary | ICD-10-CM

## 2019-10-29 DIAGNOSIS — Z86718 Personal history of other venous thrombosis and embolism: Secondary | ICD-10-CM | POA: Insufficient documentation

## 2019-10-29 DIAGNOSIS — R059 Cough, unspecified: Secondary | ICD-10-CM

## 2019-10-29 DIAGNOSIS — Z20822 Contact with and (suspected) exposure to covid-19: Secondary | ICD-10-CM | POA: Insufficient documentation

## 2019-10-29 DIAGNOSIS — Z8551 Personal history of malignant neoplasm of bladder: Secondary | ICD-10-CM | POA: Diagnosis not present

## 2019-10-29 DIAGNOSIS — Z87891 Personal history of nicotine dependence: Secondary | ICD-10-CM | POA: Insufficient documentation

## 2019-10-29 DIAGNOSIS — Z888 Allergy status to other drugs, medicaments and biological substances status: Secondary | ICD-10-CM | POA: Insufficient documentation

## 2019-10-29 LAB — RESPIRATORY PANEL BY RT PCR (FLU A&B, COVID)
Influenza A by PCR: NEGATIVE
Influenza B by PCR: NEGATIVE
SARS Coronavirus 2 by RT PCR: NEGATIVE

## 2019-10-29 MED ORDER — DOXYCYCLINE HYCLATE 100 MG PO CAPS: 100.0000 mg | ORAL_CAPSULE | Freq: Two times a day (BID) | ORAL | 0 refills | Status: AC

## 2019-10-29 MED ORDER — DOXYCYCLINE HYCLATE 100 MG PO TABS: 100.0000 mg | ORAL_TABLET | Freq: Two times a day (BID) | ORAL | 0 refills | Status: DC

## 2019-10-29 MED ORDER — DOXYCYCLINE HYCLATE 100 MG PO TABS
100.0000 mg | ORAL_TABLET | Freq: Once | ORAL | Status: AC
  Administered 2019-10-29: 21:00:00 100 mg via ORAL
  Filled 2019-10-29: qty 1

## 2019-10-29 MED ORDER — IPRATROPIUM-ALBUTEROL 0.5-2.5 (3) MG/3ML IN SOLN
3.0000 mL | Freq: Once | RESPIRATORY_TRACT | Status: AC
  Administered 2019-10-29: 3 mL via RESPIRATORY_TRACT
  Filled 2019-10-29: qty 3

## 2019-10-29 NOTE — Telephone Encounter (Signed)
Hospice orders received, signed and mailed back in envelope provided. Copy of forms sent for scanning.

## 2019-10-29 NOTE — Telephone Encounter (Signed)
Requesting Verbal Order    Caller/Agency: [facility] [place] Number:[phone]  Requesting PT  Frequency:  1 time for 1weeks  2 times 4weeks   1time  for three wekks

## 2019-10-29 NOTE — Discharge Instructions (Signed)
You were seen in the emergency department today with cough.  Your chest x-ray did not show an obvious pneumonia.  I am starting you on antibiotic which is helped in the past.  Your Covid test and flu testing were negative today.  Please call your primary care doctor first thing on Monday to discuss your ED visit and follow-up on your symptoms.  If you develop shortness of breath, fever, chest pain, other severe symptoms you should return to the emergency department immediately for evaluation.

## 2019-10-29 NOTE — ED Triage Notes (Signed)
Pt c/o SOb and cough

## 2019-10-29 NOTE — Patient Instructions (Signed)
Recent bronchitis with chronic respiratory failure and low o2 sat. I rx'd aguemtin, advised use neb treatment and get cxr.  Planned to review cxr and then contact pt by phone or my chart.  After hours looked for cxr saw that he was in ED. With hx, age, hx and upcoming weekend ED evaluation safest.  Follow up with [place] post ED evaluation or as needed.

## 2019-10-29 NOTE — Progress Notes (Signed)
   Subjective:    Patient ID: Shaun Yoder, male    DOB: [birth year], 74 y.o.   MRN: [id number]  HPI Virtual Visit via Video Note  I connected with Charise Carwin on 10/29/19 at  4:20 PM EDT by a video enabled telemedicine application and verified that I am speaking with the correct person using two identifiers.  Location: Patient: home Provider: office    I discussed the limitations of evaluation and management by telemedicine and the availability of in person appointments. The patient expressed understanding and agreed to proceed.  History of Present Illness:   Pt has nasal congestion, st and chest congestion. Pt had bronchitis twice in past 3 months. When he cough will bring up yellow mucus. Recurrent symptoms past week.  Pt used nebulizer this morning and it seems to help. Pt has been wheezing on and off for 2 days. Got tx 3 hours ago.  He used to smoke. Pt does have oxygen at home. He has not been on that today. Usually if not on will run below 90% in 80's per wife.  Reviewed 08-13-2019 not and he does have chronic low 02.  "His oxygen is usually in the high 80s low 90s, today it has ranged from 81% to 85%"  With o2 today went up to 96%      Observations/Objective:  General-no acute distress, pleasant, oriented. But on video does have gurgling sound when he talks. Lungs- on inspection lungs appear unlabored. Neck- no tracheal deviation or jvd on inspection. Neuro- gross motor function appears intact.   Assessment and Plan: Recent bronchitis with chronic respiratory failure and low o2 sat. I rx'd aguemtin, advised use neb treatment and get cxr.  Planned to review cxr and then contact pt by phone or my chart.  After hours looked for cxr saw that he was in ED. With hx, age, hx and upcoming weekend ED evaluation safest.  Follow up with [place] post ED evaluation or as needed.  Time spent with patient today was 30  minutes which consisted of chart review,  discussing diagnosis, work up,  Treatmen, review/ search fo xray results after hours, review of ED note t and documentation.  Mackie Pai, PA-C  Follow Up Instructions:    I discussed the assessment and treatment plan with the patient. The patient was provided an opportunity to ask questions and all were answered. The patient agreed with the plan and demonstrated an understanding of the instructions.   The patient was advised to call back or seek an in-person evaluation if the symptoms worsen or if the condition fails to improve as anticipated.     Mackie Pai, PA-C    Review of Systems     Objective:   Physical Exam        Assessment & Plan:

## 2019-10-29 NOTE — Telephone Encounter (Signed)
Spoke w/ Lidji- verbal orders given.  

## 2019-10-29 NOTE — ED Provider Notes (Signed)
Emergency Department Provider Note   I have reviewed the triage vital signs and the nursing notes.   HISTORY  Chief Complaint Shortness of Breath   HPI Shaun Yoder is a 74 y.o. male with past medical history reviewed below presents to the emergency department with cough and chest congestion over the past 2 to 3 days.  He has had recent episodes similar to this which improved with antibiotics and nebulizer at home.  Symptoms returned 2 days ago without fever or chills.  He is not experiencing chest pain or specific shortness of breath.  His cough is productive at times.  He discussed this with his primary care doctor who sent him in for a chest x-ray.  No radiation of symptoms or other modifying factors.  Wife is also at bedside for discussion. He uses supplemental oxygen at home as needed.  He is currently followed by palliative care as an outpatient.   Past Medical History:  Diagnosis Date  . Anemia   . Cancer ([place])    bladder  . Chronic venous insufficiency   . Colitis   . Colon polyps   . Depression   . Diverticulosis   . DVT of deep femoral vein ([place])   . Gastritis   . GERD (gastroesophageal reflux disease)   . History of rectal polyps   . Hyperlipidemia   . Hypertension   . Iron deficiency   . Low back pain   . Lymphedema of both lower extremities   . OA (osteoarthritis)   . 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 Schizoaffective disorder, bipolar type ([place])   . Sleep apnea   . Varicose vein     Patient Active Problem List   Diagnosis Date Noted  . Chronic respiratory failure with hypoxia ([place]) 03/19/2018  . Bacteremia due to methicillin susceptible Staphylococcus aureus (MSSA) 12/26/2017  . Cellulitis of left leg 12/25/2017  . Sepsis ([place]) 08/28/2017  . Nocturnal hypoxemia 02/11/2017  . DJD (degenerative joint disease) 12/06/2016  . Chronic right shoulder pain 12/06/2016  . Contracture of joint of right shoulder region 12/06/2016  . Chronic left  hip pain 09/12/2016  . Status post bilateral knee replacements 09/12/2016  . Gait disorder 06/03/2016  . Left leg DVT ([place]) 04/04/2016  . Non-pressure chronic ulcer of right ankle with fat layer exposed ([place]) 10/25/2015  . Varicose veins of lower extremities with ulcer ([place]) 09/07/2015  . Annual physical exam 05/23/2015  . PCP NOTES >>>>>>>>>>>>>>>>> 03/28/2015  . Hypertension 03/28/2015  . Pulmonary nodules 12/09/2014  . Sleep apnea 11/14/2014  . GERD (gastroesophageal reflux disease) 11/14/2014  . Morbid obesity ([place]) 11/14/2014  . Aortic stenosis, severe   . Schizoaffective disorder, bipolar type ([place])   . Rheumatoid arthritis ([place])   . Hyperlipidemia   . Bifasicular block   . Mononeuritis of upper limb 05/20/2013  . Pain in soft tissues of limb 05/20/2013  . 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 Depression 01/13/2013  . Memory loss 01/13/2013    Past Surgical History:  Procedure Laterality Date  . BLADDER SURGERY    . TOTAL KNEE ARTHROPLASTY Bilateral   . VARICOSE VEIN SURGERY      Allergies Leflunomide, Ancef [cefazolin], Morphine and related, and Plaquenil [hydroxychloroquine sulfate]  Family History  Problem Relation Age of Onset  . Heart attack Mother   . Heart attack Father   . Stroke Father   . Rheumatologic disease Neg Hx   . Colon cancer Neg Hx   . Lung disease  Neg Hx   . Prostate cancer Neg Hx     Social History Social History   Tobacco Use  . Smoking status: Former Smoker    Packs/day: 2.50    Years: 43.00    Pack years: 107.50    Types: Cigarettes    Quit date: 07/22/1998    Years since quitting: 21.2  . Smokeless tobacco: Never Used  Substance Use Topics  . Alcohol use: Not Currently    Alcohol/week: 0.0 standard drinks  . Drug use: No    Review of Systems  Constitutional: No fever/chills Eyes: No visual changes. ENT: No sore throat. Cardiovascular: Denies chest pain. Respiratory: Denies shortness of breath. Positive productive  cough.  Gastrointestinal: No abdominal pain.  No nausea, no vomiting.  No diarrhea.  No constipation. Genitourinary: Negative for dysuria. Musculoskeletal: Negative for back pain. Skin: Negative for rash. Neurological: Negative for headaches, focal weakness or numbness.  10-point ROS otherwise negative.  ____________________________________________   PHYSICAL EXAM:  VITAL SIGNS: ED Triage Vitals  Enc Vitals Group     BP 10/29/19 1752 117/86     Pulse Rate 10/29/19 1752 67     Resp 10/29/19 1752 18     Temp 10/29/19 1752 98.7 F (37.1 C)     Temp Source 10/29/19 1752 Oral     SpO2 10/29/19 1748 (!) 88 %     Weight 10/29/19 1748 171 lb (77.6 kg)   Constitutional: Alert and oriented. Well appearing and in no acute distress. Eyes: Conjunctivae are normal.  Head: Atraumatic. Nose: No congestion/rhinnorhea. Mouth/Throat: Mucous membranes are moist.  Neck: No stridor.   Cardiovascular: Normal rate, regular rhythm. Good peripheral circulation. Grossly normal heart sounds.   Respiratory: Normal respiratory effort.  No retractions. Lungs with bilateral rhonchi. No wheezing.  Gastrointestinal: Soft and nontender. No distention.  Neurologic:  Normal speech and language. No gross focal neurologic deficits are appreciated.  Skin:  Skin is warm, dry and intact. No rash noted.  ____________________________________________   LABS (all labs ordered are listed, but only abnormal results are displayed)  Labs Reviewed  RESPIRATORY PANEL BY RT PCR (FLU A&B, COVID)   ____________________________________________  RADIOLOGY  DG Chest Portable 1 View  Result Date: 10/29/2019 CLINICAL DATA:  Worsening cough, congestion EXAM: PORTABLE CHEST 1 VIEW COMPARISON:  02/28/2018 FINDINGS: Low lung volumes with bibasilar atelectasis. Mild cardiomegaly. No effusions. Severe chronic deformity of the humeral heads bilaterally with probable chronic right shoulder dislocation. No acute bony abnormality.  IMPRESSION: Low lung volumes, bibasilar atelectasis.  Mild cardiomegaly. Electronically Signed   By: Rolm Baptise M.D.   On: 10/29/2019 18:43    ____________________________________________   PROCEDURES  Procedure(s) performed:   Procedures  None ____________________________________________   INITIAL IMPRESSION / ASSESSMENT AND PLAN / ED COURSE  Pertinent labs & imaging results that were available during my care of the patient were reviewed by me and considered in my medical decision making (see chart for details).   Patient presents to the emergency department for evaluation of productive cough with rhonchi on exam.  He had a chest x-ray performed here showing low lung volumes with bibasilar atelectasis and mild cardiomegaly.  Question effusion on the right on my evaluation.  Patient is on his baseline home oxygen here.  No wheezing.  Plan for Covid along with flu testing and if negative will give nebulizer at home along with a trial of antibiotics and close PCP follow-up.  No chest pain or vital sign abnormalities to suspect acute coronary syndrome  or PE.   COVID and flu negative. CXR reviewed and radiology read reviewed. On my read question a small to moderate right side pleural effusion. Will repeat abx and duoneb here. Patient on home O2 requirement and not feeling subjectively SOB. No CP. Plan for close PCP follow up. Discussed strict ED return precautions with patient and wife at bedside.  ____________________________________________  FINAL CLINICAL IMPRESSION(S) / ED DIAGNOSES  Final diagnoses:  Productive cough     MEDICATIONS GIVEN DURING THIS VISIT:  Medications  ipratropium-albuterol (DUONEB) 0.5-2.5 (3) MG/3ML nebulizer solution 3 mL (3 mLs Nebulization Given 10/29/19 2021)  doxycycline (VIBRA-TABS) tablet 100 mg (100 mg Oral Given 10/29/19 2035)     NEW OUTPATIENT MEDICATIONS STARTED DURING THIS VISIT:  Discharge Medication List as of 10/29/2019  8:56 PM    START  taking these medications   Details  doxycycline (VIBRAMYCIN) 100 MG capsule Take 1 capsule (100 mg total) by mouth 2 (two) times daily for 7 days., Starting Fri 10/29/2019, Until Fri 11/05/2019, Normal        Note:  This document was prepared using Dragon voice recognition software and may include unintentional dictation errors.  Nanda Quinton, MD, [facility] Emergency Medicine    Johnchristopher Sarvis, [facility], MD 10/30/19 [phone]

## 2019-11-02 ENCOUNTER — Telehealth (INDEPENDENT_AMBULATORY_CARE_PROVIDER_SITE_OTHER): Payer: Medicare Other | Admitting: Internal Medicine

## 2019-11-02 ENCOUNTER — Other Ambulatory Visit: Payer: Self-pay

## 2019-11-02 ENCOUNTER — Encounter: Payer: Self-pay | Admitting: Internal Medicine

## 2019-11-02 VITALS — Ht 61.0 in | Wt 171.0 lb

## 2019-11-02 DIAGNOSIS — J9611 Chronic respiratory failure with hypoxia: Secondary | ICD-10-CM

## 2019-11-02 DIAGNOSIS — G8929 Other chronic pain: Secondary | ICD-10-CM | POA: Diagnosis not present

## 2019-11-02 DIAGNOSIS — M25552 Pain in left hip: Secondary | ICD-10-CM | POA: Diagnosis not present

## 2019-11-02 DIAGNOSIS — I82412 Acute embolism and thrombosis of left femoral vein: Secondary | ICD-10-CM | POA: Diagnosis not present

## 2019-11-02 DIAGNOSIS — I10 Essential (primary) hypertension: Secondary | ICD-10-CM | POA: Diagnosis not present

## 2019-11-02 DIAGNOSIS — M05719 Rheumatoid arthritis with rheumatoid factor of unspecified shoulder without organ or systems involvement: Secondary | ICD-10-CM | POA: Diagnosis not present

## 2019-11-02 DIAGNOSIS — I83009 Varicose veins of unspecified lower extremity with ulcer of unspecified site: Secondary | ICD-10-CM | POA: Diagnosis not present

## 2019-11-02 NOTE — Progress Notes (Signed)
Subjective:    Patient ID: Shaun Yoder, male    DOB: Feb 25, 1946, 74 y.o.   MRN: [id number]  DOS:  11/02/2019 Type of visit - description: Virtual Visit via Telephone  Attempted  to make this a video visit, due to technical difficulties from the patient side it was not possible  thus we proceeded with a Virtual Visit via Telephone    I connected with above mentioned patient  by telephone and verified that I am speaking with the correct person using two identifiers.  THIS ENCOUNTER IS A VIRTUAL VISIT DUE TO COVID-19 - PATIENT WAS NOT SEEN IN THE OFFICE. PATIENT HAS CONSENTED TO VIRTUAL VISIT / TELEMEDICINE VISIT   Location of patient: home  Location of provider: office  I discussed the limitations, risks, security and privacy concerns of performing an evaluation and management service by telephone and the availability of in person appointments. I also discussed with the patient that there may be a patient responsible charge related to this service. The patient expressed understanding and agreed to proceed.  ER follow-up The patient went to the ER with respiratory symptoms on 10/29/2019. Chest x-ray showed no pneumonia, Covid influenza test negative. He was treated with doxycycline.  He started to improve in the last 2 to 3 days: No fever chills No chest pain Cough and sputum production decreased. No nausea, vomiting, diarrhea.  The wife is in the call, she reports that he looks weaker to her and he has lost weight.    Review of Systems See above   Past Medical History:  Diagnosis Date  . Anemia   . Cancer ([place])    bladder  . Chronic venous insufficiency   . Colitis   . Colon polyps   . Depression   . Diverticulosis   . DVT of deep femoral vein ([place])   . Gastritis   . GERD (gastroesophageal reflux disease)   . History of rectal polyps   . Hyperlipidemia   . Hypertension   . Iron deficiency   . Low back pain   . Lymphedema of both lower extremities   . OA  (osteoarthritis)   . Osteonecrosis of shoulder region ([place])   . Schizoaffective disorder, bipolar type ([place])   . Sleep apnea   . Varicose vein     Past Surgical History:  Procedure Laterality Date  . BLADDER SURGERY    . TOTAL KNEE ARTHROPLASTY Bilateral   . VARICOSE VEIN SURGERY      Allergies as of 11/02/2019      Reactions   Leflunomide Other (See Comments)   diarrhea   Ancef [cefazolin]    Rash   Morphine And Related Nausea And Vomiting   Plaquenil [hydroxychloroquine Sulfate]    rash      Medication List       Accurate as of November 02, 2019 11:59 PM. If you have any questions, ask your nurse or doctor.        albuterol 108 (90 Base) MCG/ACT inhaler Commonly known as: VENTOLIN HFA Inhale 2 puffs into the lungs every 6 (six) hours as needed for wheezing or shortness of breath.   albuterol (2.5 MG/3ML) 0.083% nebulizer solution Commonly known as: PROVENTIL Take 3 mLs (2.5 mg total) by nebulization 4 (four) times daily as needed for wheezing or shortness of breath.   ARIPiprazole 30 MG tablet Commonly known as: ABILIFY Take 30 mg by mouth at bedtime.   aspirin EC 81 MG tablet Take 81 mg by mouth daily.  augmented betamethasone dipropionate 0.05 % cream Commonly known as: DIPROLENE-AF Apply topically 2 (two) times daily. Apply to the rash of the right side of the face   Calcium 500/D 500-200 MG-UNIT tablet Generic drug: calcium-vitamin D Take 1 tablet by mouth daily with breakfast.   clotrimazole 1 % cream Commonly known as: LOTRIMIN Apply 1 application topically daily as needed.   divalproex 250 MG DR tablet Commonly known as: DEPAKOTE Take 250 mg by mouth daily with lunch.   divalproex 500 MG 24 hr tablet Commonly known as: DEPAKOTE ER Take 1,000 mg by mouth daily. 3 tabs at bedtime   doxycycline 100 MG capsule Commonly known as: VIBRAMYCIN Take 1 capsule (100 mg total) by mouth 2 (two) times daily for 7 days.   Eliquis 5 MG Tabs tablet Generic  drug: apixaban Take 2.5 mg by mouth 2 (two) times daily.   folic acid 1 MG tablet Commonly known as: FOLVITE Take 1 mg by mouth daily.   Humira 40 MG/0.4ML Pskt Generic drug: Adalimumab Inject into the skin.   ketoconazole 2 % cream Commonly known as: NIZORAL Apply 1 application topically daily.   mirabegron ER 50 MG Tb24 tablet Commonly known as: MYRBETRIQ Take 50 mg by mouth daily.   Omega-3 1000 MG Caps Take 1 capsule by mouth daily.   omeprazole 20 MG capsule Commonly known as: PRILOSEC Take 20 mg by mouth every morning.   oxybutynin 5 MG tablet Commonly known as: DITROPAN Take 5 mg by mouth 3 (three) times daily.   oxyCODONE-acetaminophen 5-325 MG tablet Commonly known as: PERCOCET/ROXICET Take 1 tablet by mouth every 6 (six) hours as needed for moderate pain.   predniSONE 5 MG tablet Commonly known as: DELTASONE Take 5 mg by mouth daily with breakfast.   triamcinolone cream 0.1 % Commonly known as: KENALOG Apply 1 application topically 2 (two) times daily.   vitamin C 500 MG tablet Commonly known as: ASCORBIC ACID Take 500 mg by mouth daily.   ziprasidone 80 MG capsule Commonly known as: GEODON Take 80 mg by mouth at bedtime.   ziprasidone 20 MG capsule Commonly known as: GEODON Take 20 mg by mouth daily at 12 noon.          Objective:   Physical Exam Ht 5\' 1"  (1.549 m)   Wt 171 lb (77.6 kg)   BMI 32.31 kg/m  This is a telephone assessment.  The patient is alert oriented x3.  No respiratory distress noted    Assessment      Assessment   ( transfer from [facility],02-2015) Hypertension Hyperlipidemia Ao stenosis, severe (Dr [place]lena Heckle [place] MSK: --Seronegative Rheumatoid arthritis  --DJD-- DR Tamera Punt , dr Ronnie Derby --low back pain d/t DJD, Dr Sherlyn Lick  PSYCH: Schizophrenia, bipolar, depression: Follow-up at the [facility] in [place] GU: Bladder cancer-transitional cell, Dr. Shona Needles GI:   GERD, history of colitis, history of gastritis,  colon  polyps- had a virtual cscope 2016, + polyps, declined further eval Pulmomany:   --Sleep apnea:  Cpap intolerant --CT nodules f/u 2 pulmonary  --Chronic respiratory failure with hypoxemia, dx 01-2017, unable to get O2 as of 1-20 21 either from [facility] or the [facility]: L LEG DVT 02-2016 + Lupus anticoagulant Varicose veins: s/p remote surgery, has LE wounds  H/o  iron deficiency anemia At the [facility]: sees a dentist, PCP, psych, Ortho, pain mngmt    PLAN Chronic hypoxic respiratory failure with recent exacerbation: Has a diagnosis of chronic hypoxic respiratory failure since 2018, now w/ more  frequent exacerbations. Since the ER visit he is taking antibiotics, O2 sat 95% with oxygen supplements. On chart review, he is a former heavy smoker, I cannot find PFTs in the chart, CT chest 12/25/2017: Left lower lobe bronchiectasis, right-sided scarring and ATX. Has not seen pulmonary recently, in the past he has seen Dr. Ashok Cordia and Dr. Elsworth Soho. The patien's wife tells me that she is not sure if he sees a pulmonologist at the [facility]. Currently on albuterol nebulization as needed only. Plan: Continue antibiotics as prescribed to the ER, continue monitoring your oxygen levels. Refer to pulmonary for treatment optimization. RTC here 3 months.   The patient was advised to call back or seek an in-person evaluation if the symptoms worsen or if the condition fails to improve as anticipated.  I provided 25 minutes of non-face-to-face time during this encounter.  Kathlene November, MD

## 2019-11-02 NOTE — Progress Notes (Signed)
Pre visit review using our clinic review tool, if applicable. No additional management support is needed unless otherwise documented below in the visit note. 

## 2019-11-03 DIAGNOSIS — L97111 Non-pressure chronic ulcer of right thigh limited to breakdown of skin: Secondary | ICD-10-CM | POA: Diagnosis not present

## 2019-11-03 DIAGNOSIS — L89131 Pressure ulcer of right lower back, stage 1: Secondary | ICD-10-CM | POA: Diagnosis not present

## 2019-11-03 DIAGNOSIS — S81011D Laceration without foreign body, right knee, subsequent encounter: Secondary | ICD-10-CM | POA: Diagnosis not present

## 2019-11-03 DIAGNOSIS — L97121 Non-pressure chronic ulcer of left thigh limited to breakdown of skin: Secondary | ICD-10-CM | POA: Diagnosis not present

## 2019-11-03 NOTE — Assessment & Plan Note (Signed)
Chronic hypoxic respiratory failure with recent exacerbation: Has a diagnosis of chronic hypoxic respiratory failure since 2018, now w/ more frequent exacerbations. Since the ER visit he is taking antibiotics, O2 sat 95% with oxygen supplements. On chart review, he is a former heavy smoker, I cannot find PFTs in the chart, CT chest 12/25/2017: Left lower lobe bronchiectasis, right-sided scarring and ATX. Has not seen pulmonary recently, in the past he has seen Dr. Ashok Cordia and Dr. Elsworth Soho. The patien's wife tells me that she is not sure if he sees a pulmonologist at the [facility]. Currently on albuterol nebulization as needed only. Plan: Continue antibiotics as prescribed to the ER, continue monitoring your oxygen levels. Refer to pulmonary for treatment optimization. RTC here 3 months.

## 2019-11-04 ENCOUNTER — Telehealth: Payer: Self-pay | Admitting: Internal Medicine

## 2019-11-04 NOTE — Telephone Encounter (Signed)
Spoke w/ Ron- orders given to d/c MSW.

## 2019-11-04 NOTE — Telephone Encounter (Signed)
If they decline social services is okay to discontinue the order

## 2019-11-04 NOTE — Telephone Encounter (Signed)
Please advise 

## 2019-11-04 NOTE — Telephone Encounter (Signed)
Shaun Yoder  Call Back [phone]   Patient's wife declined social services with [facility], patient is [address] connected [place], services are [facility] with [facility].     Verbal approval to discontinued to the order for social work.

## 2019-11-05 DIAGNOSIS — I10 Essential (primary) hypertension: Secondary | ICD-10-CM | POA: Diagnosis not present

## 2019-11-05 DIAGNOSIS — M05719 Rheumatoid arthritis with rheumatoid factor of unspecified shoulder without organ or systems involvement: Secondary | ICD-10-CM | POA: Diagnosis not present

## 2019-11-05 DIAGNOSIS — M25552 Pain in left hip: Secondary | ICD-10-CM | POA: Diagnosis not present

## 2019-11-05 DIAGNOSIS — I82412 Acute embolism and thrombosis of left femoral vein: Secondary | ICD-10-CM | POA: Diagnosis not present

## 2019-11-05 DIAGNOSIS — I83009 Varicose veins of unspecified lower extremity with ulcer of unspecified site: Secondary | ICD-10-CM | POA: Diagnosis not present

## 2019-11-05 DIAGNOSIS — G8929 Other chronic pain: Secondary | ICD-10-CM | POA: Diagnosis not present

## 2019-11-08 DIAGNOSIS — G8929 Other chronic pain: Secondary | ICD-10-CM | POA: Diagnosis not present

## 2019-11-08 DIAGNOSIS — M25552 Pain in left hip: Secondary | ICD-10-CM | POA: Diagnosis not present

## 2019-11-08 DIAGNOSIS — M05719 Rheumatoid arthritis with rheumatoid factor of unspecified shoulder without organ or systems involvement: Secondary | ICD-10-CM | POA: Diagnosis not present

## 2019-11-08 DIAGNOSIS — I10 Essential (primary) hypertension: Secondary | ICD-10-CM | POA: Diagnosis not present

## 2019-11-08 DIAGNOSIS — I82412 Acute embolism and thrombosis of left femoral vein: Secondary | ICD-10-CM | POA: Diagnosis not present

## 2019-11-08 DIAGNOSIS — I83009 Varicose veins of unspecified lower extremity with ulcer of unspecified site: Secondary | ICD-10-CM | POA: Diagnosis not present

## 2019-11-11 DIAGNOSIS — I82412 Acute embolism and thrombosis of left femoral vein: Secondary | ICD-10-CM | POA: Diagnosis not present

## 2019-11-11 DIAGNOSIS — M25552 Pain in left hip: Secondary | ICD-10-CM | POA: Diagnosis not present

## 2019-11-11 DIAGNOSIS — I10 Essential (primary) hypertension: Secondary | ICD-10-CM | POA: Diagnosis not present

## 2019-11-11 DIAGNOSIS — M05719 Rheumatoid arthritis with rheumatoid factor of unspecified shoulder without organ or systems involvement: Secondary | ICD-10-CM | POA: Diagnosis not present

## 2019-11-11 DIAGNOSIS — I83009 Varicose veins of unspecified lower extremity with ulcer of unspecified site: Secondary | ICD-10-CM | POA: Diagnosis not present

## 2019-11-11 DIAGNOSIS — G8929 Other chronic pain: Secondary | ICD-10-CM | POA: Diagnosis not present

## 2019-11-12 ENCOUNTER — Telehealth: Payer: Self-pay

## 2019-11-12 DIAGNOSIS — M4726 Other spondylosis with radiculopathy, lumbar region: Secondary | ICD-10-CM | POA: Diagnosis not present

## 2019-11-12 DIAGNOSIS — Z6841 Body Mass Index (BMI) 40.0 and over, adult: Secondary | ICD-10-CM

## 2019-11-12 DIAGNOSIS — G8929 Other chronic pain: Secondary | ICD-10-CM | POA: Diagnosis not present

## 2019-11-12 DIAGNOSIS — I890 Lymphedema, not elsewhere classified: Secondary | ICD-10-CM | POA: Diagnosis not present

## 2019-11-12 DIAGNOSIS — I10 Essential (primary) hypertension: Secondary | ICD-10-CM

## 2019-11-12 DIAGNOSIS — M1990 Unspecified osteoarthritis, unspecified site: Secondary | ICD-10-CM | POA: Diagnosis not present

## 2019-11-12 DIAGNOSIS — F250 Schizoaffective disorder, bipolar type: Secondary | ICD-10-CM | POA: Diagnosis not present

## 2019-11-12 DIAGNOSIS — I350 Nonrheumatic aortic (valve) stenosis: Secondary | ICD-10-CM | POA: Diagnosis not present

## 2019-11-12 DIAGNOSIS — I83009 Varicose veins of unspecified lower extremity with ulcer of unspecified site: Secondary | ICD-10-CM

## 2019-11-12 DIAGNOSIS — M25552 Pain in left hip: Secondary | ICD-10-CM | POA: Diagnosis not present

## 2019-11-12 DIAGNOSIS — I82412 Acute embolism and thrombosis of left femoral vein: Secondary | ICD-10-CM | POA: Diagnosis not present

## 2019-11-12 DIAGNOSIS — E785 Hyperlipidemia, unspecified: Secondary | ICD-10-CM

## 2019-11-12 DIAGNOSIS — Z7982 Long term (current) use of aspirin: Secondary | ICD-10-CM

## 2019-11-12 DIAGNOSIS — M87819 Other osteonecrosis, unspecified shoulder: Secondary | ICD-10-CM | POA: Diagnosis not present

## 2019-11-12 DIAGNOSIS — Z7901 Long term (current) use of anticoagulants: Secondary | ICD-10-CM

## 2019-11-12 DIAGNOSIS — M05719 Rheumatoid arthritis with rheumatoid factor of unspecified shoulder without organ or systems involvement: Secondary | ICD-10-CM | POA: Diagnosis not present

## 2019-11-12 NOTE — Telephone Encounter (Signed)
Plan of care signed and faxed back to [facility] at [phone]. Forms sent for scanning.

## 2019-11-13 ENCOUNTER — Other Ambulatory Visit: Payer: Self-pay | Admitting: Internal Medicine

## 2019-11-13 DIAGNOSIS — L219 Seborrheic dermatitis, unspecified: Secondary | ICD-10-CM

## 2019-11-15 DIAGNOSIS — M25552 Pain in left hip: Secondary | ICD-10-CM | POA: Diagnosis not present

## 2019-11-15 DIAGNOSIS — I82412 Acute embolism and thrombosis of left femoral vein: Secondary | ICD-10-CM | POA: Diagnosis not present

## 2019-11-15 DIAGNOSIS — M05719 Rheumatoid arthritis with rheumatoid factor of unspecified shoulder without organ or systems involvement: Secondary | ICD-10-CM | POA: Diagnosis not present

## 2019-11-15 DIAGNOSIS — I10 Essential (primary) hypertension: Secondary | ICD-10-CM | POA: Diagnosis not present

## 2019-11-15 DIAGNOSIS — G8929 Other chronic pain: Secondary | ICD-10-CM | POA: Diagnosis not present

## 2019-11-15 DIAGNOSIS — I83009 Varicose veins of unspecified lower extremity with ulcer of unspecified site: Secondary | ICD-10-CM | POA: Diagnosis not present

## 2019-11-16 DIAGNOSIS — G8929 Other chronic pain: Secondary | ICD-10-CM | POA: Diagnosis not present

## 2019-11-16 DIAGNOSIS — M25552 Pain in left hip: Secondary | ICD-10-CM | POA: Diagnosis not present

## 2019-11-16 DIAGNOSIS — I83009 Varicose veins of unspecified lower extremity with ulcer of unspecified site: Secondary | ICD-10-CM | POA: Diagnosis not present

## 2019-11-16 DIAGNOSIS — M05719 Rheumatoid arthritis with rheumatoid factor of unspecified shoulder without organ or systems involvement: Secondary | ICD-10-CM | POA: Diagnosis not present

## 2019-11-16 DIAGNOSIS — I82412 Acute embolism and thrombosis of left femoral vein: Secondary | ICD-10-CM | POA: Diagnosis not present

## 2019-11-16 DIAGNOSIS — I10 Essential (primary) hypertension: Secondary | ICD-10-CM | POA: Diagnosis not present

## 2019-11-17 DIAGNOSIS — L97222 Non-pressure chronic ulcer of left calf with fat layer exposed: Secondary | ICD-10-CM | POA: Diagnosis not present

## 2019-11-17 DIAGNOSIS — L97121 Non-pressure chronic ulcer of left thigh limited to breakdown of skin: Secondary | ICD-10-CM | POA: Diagnosis not present

## 2019-11-17 DIAGNOSIS — L89302 Pressure ulcer of unspecified buttock, stage 2: Secondary | ICD-10-CM | POA: Diagnosis not present

## 2019-11-17 DIAGNOSIS — L97111 Non-pressure chronic ulcer of right thigh limited to breakdown of skin: Secondary | ICD-10-CM | POA: Diagnosis not present

## 2019-11-19 ENCOUNTER — Telehealth: Payer: Self-pay

## 2019-11-19 NOTE — Telephone Encounter (Signed)
Pt having troubles w/ irregular bowel movements- he has been trying Miralax and an "orange tablet" over-the-counter. His last bowel movement was 2-3 hours ago but then he may not go for another 5-7 days. Per Dr. Larose Kells- continue Miralax daily and add stool softener. Needs to go to ED if no BM in 2-3 days and has N/V. Pt verbalized understanding.

## 2019-11-19 NOTE — Telephone Encounter (Signed)
Tried calling Pt back- no answer, no voicemail.

## 2019-11-19 NOTE — Telephone Encounter (Signed)
Patient Called in to speak with Dr. Larose Kells or the nurse about his BM issues Please call the patient at  [phone]

## 2019-11-22 ENCOUNTER — Telehealth: Payer: Self-pay | Admitting: Internal Medicine

## 2019-11-22 NOTE — Telephone Encounter (Signed)
LMOM for [name] w/ PCP recommendations.

## 2019-11-22 NOTE — Telephone Encounter (Signed)
Please advise 

## 2019-11-22 NOTE — Telephone Encounter (Signed)
°  Caller [facility]  Call Back [phone]  Subject : Nose Bleed  Per [name], patient is having a nose bleed longer than normal. Per [name], she is wondering if patient could get some saline solution.

## 2019-11-22 NOTE — Telephone Encounter (Signed)
Needs to apply pressure to the nose. If bleeding does not stop in 10 or 15 minutes, will have to use afrin OTC nasal drops. Use 2 or 3 drops on each side of the nose and then apply pressure again. Use Afrin  sporadically because it may raise his blood pressure. If the bleeding is severe or persistent need to call or go to the ER. At nighttime, he may like to put some Vaseline at the nostrils to prevent dryness.

## 2019-11-24 ENCOUNTER — Telehealth: Payer: Self-pay | Admitting: Internal Medicine

## 2019-11-24 DIAGNOSIS — G8929 Other chronic pain: Secondary | ICD-10-CM | POA: Diagnosis not present

## 2019-11-24 DIAGNOSIS — I10 Essential (primary) hypertension: Secondary | ICD-10-CM | POA: Diagnosis not present

## 2019-11-24 DIAGNOSIS — I82412 Acute embolism and thrombosis of left femoral vein: Secondary | ICD-10-CM | POA: Diagnosis not present

## 2019-11-24 DIAGNOSIS — I83009 Varicose veins of unspecified lower extremity with ulcer of unspecified site: Secondary | ICD-10-CM | POA: Diagnosis not present

## 2019-11-24 DIAGNOSIS — M05719 Rheumatoid arthritis with rheumatoid factor of unspecified shoulder without organ or systems involvement: Secondary | ICD-10-CM | POA: Diagnosis not present

## 2019-11-24 DIAGNOSIS — M25552 Pain in left hip: Secondary | ICD-10-CM | POA: Diagnosis not present

## 2019-11-24 NOTE — Telephone Encounter (Signed)
Spoke w/ Lidji- verbal orders given.

## 2019-11-24 NOTE — Telephone Encounter (Signed)
Requesting Verbal Order  Caller Elmyra Ricks  [phone]   Per L[place], patient needs verbal order for  Nursing Care For wounds

## 2019-11-25 ENCOUNTER — Encounter (HOSPITAL_BASED_OUTPATIENT_CLINIC_OR_DEPARTMENT_OTHER): Payer: Self-pay

## 2019-11-25 ENCOUNTER — Inpatient Hospital Stay (HOSPITAL_BASED_OUTPATIENT_CLINIC_OR_DEPARTMENT_OTHER)
Admission: EM | Admit: 2019-11-25 | Discharge: 2019-12-01 | DRG: 177 | Disposition: A | Discharge status: Skilled Nursing Facility | Payer: Medicare Other | Attending: Internal Medicine | Admitting: Internal Medicine

## 2019-11-25 ENCOUNTER — Other Ambulatory Visit: Payer: Self-pay

## 2019-11-25 ENCOUNTER — Emergency Department (HOSPITAL_BASED_OUTPATIENT_CLINIC_OR_DEPARTMENT_OTHER): Payer: Medicare Other

## 2019-11-25 DIAGNOSIS — G603 Idiopathic progressive neuropathy: Secondary | ICD-10-CM | POA: Diagnosis present

## 2019-11-25 DIAGNOSIS — Z885 Allergy status to narcotic agent status: Secondary | ICD-10-CM

## 2019-11-25 DIAGNOSIS — M05719 Rheumatoid arthritis with rheumatoid factor of unspecified shoulder without organ or systems involvement: Secondary | ICD-10-CM | POA: Diagnosis not present

## 2019-11-25 DIAGNOSIS — E785 Hyperlipidemia, unspecified: Secondary | ICD-10-CM | POA: Diagnosis present

## 2019-11-25 DIAGNOSIS — G8929 Other chronic pain: Secondary | ICD-10-CM | POA: Diagnosis present

## 2019-11-25 DIAGNOSIS — L97919 Non-pressure chronic ulcer of unspecified part of right lower leg with unspecified severity: Secondary | ICD-10-CM | POA: Diagnosis present

## 2019-11-25 DIAGNOSIS — M25552 Pain in left hip: Secondary | ICD-10-CM | POA: Diagnosis present

## 2019-11-25 DIAGNOSIS — Z79899 Other long term (current) drug therapy: Secondary | ICD-10-CM

## 2019-11-25 DIAGNOSIS — M6259 Muscle wasting and atrophy, not elsewhere classified, multiple sites: Secondary | ICD-10-CM | POA: Diagnosis present

## 2019-11-25 DIAGNOSIS — Z86718 Personal history of other venous thrombosis and embolism: Secondary | ICD-10-CM

## 2019-11-25 DIAGNOSIS — R0902 Hypoxemia: Secondary | ICD-10-CM

## 2019-11-25 DIAGNOSIS — I5033 Acute on chronic diastolic (congestive) heart failure: Secondary | ICD-10-CM | POA: Diagnosis not present

## 2019-11-25 DIAGNOSIS — I9589 Other hypotension: Secondary | ICD-10-CM | POA: Diagnosis present

## 2019-11-25 DIAGNOSIS — D696 Thrombocytopenia, unspecified: Secondary | ICD-10-CM | POA: Diagnosis present

## 2019-11-25 DIAGNOSIS — Z7982 Long term (current) use of aspirin: Secondary | ICD-10-CM

## 2019-11-25 DIAGNOSIS — J1520 Pneumonia due to staphylococcus, unspecified: Secondary | ICD-10-CM | POA: Diagnosis present

## 2019-11-25 DIAGNOSIS — M1990 Unspecified osteoarthritis, unspecified site: Secondary | ICD-10-CM | POA: Diagnosis present

## 2019-11-25 DIAGNOSIS — I83009 Varicose veins of unspecified lower extremity with ulcer of unspecified site: Secondary | ICD-10-CM | POA: Diagnosis present

## 2019-11-25 DIAGNOSIS — F250 Schizoaffective disorder, bipolar type: Secondary | ICD-10-CM | POA: Diagnosis present

## 2019-11-25 DIAGNOSIS — G4730 Sleep apnea, unspecified: Secondary | ICD-10-CM | POA: Diagnosis present

## 2019-11-25 DIAGNOSIS — J690 Pneumonitis due to inhalation of food and vomit: Principal | ICD-10-CM | POA: Diagnosis present

## 2019-11-25 DIAGNOSIS — I10 Essential (primary) hypertension: Secondary | ICD-10-CM | POA: Diagnosis present

## 2019-11-25 DIAGNOSIS — J189 Pneumonia, unspecified organism: Secondary | ICD-10-CM | POA: Diagnosis present

## 2019-11-25 DIAGNOSIS — I82412 Acute embolism and thrombosis of left femoral vein: Secondary | ICD-10-CM | POA: Diagnosis not present

## 2019-11-25 DIAGNOSIS — F29 Unspecified psychosis not due to a substance or known physiological condition: Secondary | ICD-10-CM | POA: Diagnosis not present

## 2019-11-25 DIAGNOSIS — Z7401 Bed confinement status: Secondary | ICD-10-CM | POA: Diagnosis not present

## 2019-11-25 DIAGNOSIS — E669 Obesity, unspecified: Secondary | ICD-10-CM | POA: Diagnosis present

## 2019-11-25 DIAGNOSIS — J811 Chronic pulmonary edema: Secondary | ICD-10-CM | POA: Diagnosis not present

## 2019-11-25 DIAGNOSIS — R1312 Dysphagia, oropharyngeal phase: Secondary | ICD-10-CM | POA: Diagnosis present

## 2019-11-25 DIAGNOSIS — M419 Scoliosis, unspecified: Secondary | ICD-10-CM | POA: Diagnosis present

## 2019-11-25 DIAGNOSIS — L97929 Non-pressure chronic ulcer of unspecified part of left lower leg with unspecified severity: Secondary | ICD-10-CM | POA: Diagnosis present

## 2019-11-25 DIAGNOSIS — Z888 Allergy status to other drugs, medicaments and biological substances status: Secondary | ICD-10-CM

## 2019-11-25 DIAGNOSIS — M069 Rheumatoid arthritis, unspecified: Secondary | ICD-10-CM | POA: Diagnosis present

## 2019-11-25 DIAGNOSIS — M6281 Muscle weakness (generalized): Secondary | ICD-10-CM | POA: Diagnosis present

## 2019-11-25 DIAGNOSIS — Z20822 Contact with and (suspected) exposure to covid-19: Secondary | ICD-10-CM | POA: Diagnosis present

## 2019-11-25 DIAGNOSIS — Z6831 Body mass index (BMI) 31.0-31.9, adult: Secondary | ICD-10-CM

## 2019-11-25 DIAGNOSIS — R54 Age-related physical debility: Secondary | ICD-10-CM | POA: Diagnosis present

## 2019-11-25 DIAGNOSIS — J9620 Acute and chronic respiratory failure, unspecified whether with hypoxia or hypercapnia: Secondary | ICD-10-CM | POA: Diagnosis present

## 2019-11-25 DIAGNOSIS — R5381 Other malaise: Secondary | ICD-10-CM | POA: Diagnosis not present

## 2019-11-25 DIAGNOSIS — R0602 Shortness of breath: Secondary | ICD-10-CM | POA: Diagnosis not present

## 2019-11-25 DIAGNOSIS — Z87891 Personal history of nicotine dependence: Secondary | ICD-10-CM

## 2019-11-25 DIAGNOSIS — K219 Gastro-esophageal reflux disease without esophagitis: Secondary | ICD-10-CM | POA: Diagnosis present

## 2019-11-25 DIAGNOSIS — Z7952 Long term (current) use of systemic steroids: Secondary | ICD-10-CM

## 2019-11-25 DIAGNOSIS — R278 Other lack of coordination: Secondary | ICD-10-CM | POA: Diagnosis present

## 2019-11-25 DIAGNOSIS — J9621 Acute and chronic respiratory failure with hypoxia: Secondary | ICD-10-CM | POA: Diagnosis present

## 2019-11-25 DIAGNOSIS — Z8249 Family history of ischemic heart disease and other diseases of the circulatory system: Secondary | ICD-10-CM

## 2019-11-25 DIAGNOSIS — Z9981 Dependence on supplemental oxygen: Secondary | ICD-10-CM

## 2019-11-25 DIAGNOSIS — I350 Nonrheumatic aortic (valve) stenosis: Secondary | ICD-10-CM | POA: Diagnosis present

## 2019-11-25 DIAGNOSIS — Z993 Dependence on wheelchair: Secondary | ICD-10-CM

## 2019-11-25 DIAGNOSIS — Z7901 Long term (current) use of anticoagulants: Secondary | ICD-10-CM

## 2019-11-25 DIAGNOSIS — M2550 Pain in unspecified joint: Secondary | ICD-10-CM | POA: Diagnosis not present

## 2019-11-25 LAB — BASIC METABOLIC PANEL
Anion gap: 8 (ref 5–15)
BUN: 24 mg/dL — ABNORMAL HIGH (ref 8–23)
CO2: 28 mmol/L (ref 22–32)
Calcium: 8.1 mg/dL — ABNORMAL LOW (ref 8.9–10.3)
Chloride: 102 mmol/L (ref 98–111)
Creatinine, Ser: 0.46 mg/dL — ABNORMAL LOW (ref 0.61–1.24)
GFR calc Af Amer: >60 mL/min (ref >60)
GFR calc non Af Amer: >60 mL/min (ref >60)
Glucose, Bld: 103 mg/dL — ABNORMAL HIGH (ref 70–99)
Potassium: 4.4 mmol/L (ref 3.5–5.1)
Sodium: 138 mmol/L (ref 135–145)

## 2019-11-25 LAB — CBC WITH DIFFERENTIAL/PLATELET
Abs Immature Granulocytes: 0.05 10*3/uL (ref 0.00–0.07)
Basophils Absolute: 0 10*3/uL (ref 0.0–0.1)
Basophils Relative: 0 %
Eosinophils Absolute: 0 10*3/uL (ref 0.0–0.5)
Eosinophils Relative: 0 %
HCT: 37.1 % — ABNORMAL LOW (ref 39.0–52.0)
Hemoglobin: 12.4 g/dL — ABNORMAL LOW (ref 13.0–17.0)
Immature Granulocytes: 0 %
Lymphocytes Relative: 8 %
Lymphs Abs: 0.9 10*3/uL (ref 0.7–4.0)
MCH: 31.9 pg (ref 26.0–34.0)
MCHC: 33.4 g/dL (ref 30.0–36.0)
MCV: 95.4 fL (ref 80.0–100.0)
Monocytes Absolute: 0.9 10*3/uL (ref 0.1–1.0)
Monocytes Relative: 8 %
Neutro Abs: 9.3 10*3/uL — ABNORMAL HIGH (ref 1.7–7.7)
Neutrophils Relative %: 84 %
Platelets: 124 10*3/uL — ABNORMAL LOW (ref 150–400)
RBC: 3.89 MIL/uL — ABNORMAL LOW (ref 4.22–5.81)
RDW: 14.8 % (ref 11.5–15.5)
WBC: 11.2 10*3/uL — ABNORMAL HIGH (ref 4.0–10.5)
nRBC: 0 % (ref 0.0–0.2)

## 2019-11-25 LAB — URINALYSIS, ROUTINE W REFLEX MICROSCOPIC
Glucose, UA: NEGATIVE mg/dL
Hgb urine dipstick: NEGATIVE
Ketones, ur: 15 mg/dL — AB
Leukocytes,Ua: NEGATIVE
Nitrite: NEGATIVE
Protein, ur: NEGATIVE mg/dL
Specific Gravity, Urine: 1.025 (ref 1.005–1.030)
pH: 6.5 (ref 5.0–8.0)

## 2019-11-25 LAB — PROTIME-INR
INR: 1.4 — ABNORMAL HIGH (ref 0.8–1.2)
Prothrombin Time: 16.3 seconds — ABNORMAL HIGH (ref 11.4–15.2)

## 2019-11-25 LAB — BRAIN NATRIURETIC PEPTIDE: B Natriuretic Peptide: 1256.6 pg/mL — ABNORMAL HIGH (ref 0.0–100.0)

## 2019-11-25 LAB — RESPIRATORY PANEL BY RT PCR (FLU A&B, COVID)
Influenza A by PCR: NEGATIVE
Influenza B by PCR: NEGATIVE
SARS Coronavirus 2 by RT PCR: NEGATIVE

## 2019-11-25 LAB — LACTIC ACID, PLASMA: Lactic Acid, Venous: 1.3 mmol/L (ref 0.5–1.9)

## 2019-11-25 LAB — TROPONIN I (HIGH SENSITIVITY)
Troponin I (High Sensitivity): 23 ng/L — ABNORMAL HIGH (ref <18)
Troponin I (High Sensitivity): 20 ng/L — ABNORMAL HIGH (ref <18)

## 2019-11-25 LAB — APTT: aPTT: 75 seconds — ABNORMAL HIGH (ref 24–36)

## 2019-11-25 IMAGING — DX DG ELBOW COMPLETE 3+V*L*
6 series · 6 of 6 positions shown · non-contrast
Comparison: 07/05/2010

CLINICAL DATA: Recent blunt trauma to elbow with pain, initial
encounter

EXAM:
LEFT ELBOW - COMPLETE 3+ VIEW

[elbow ap]
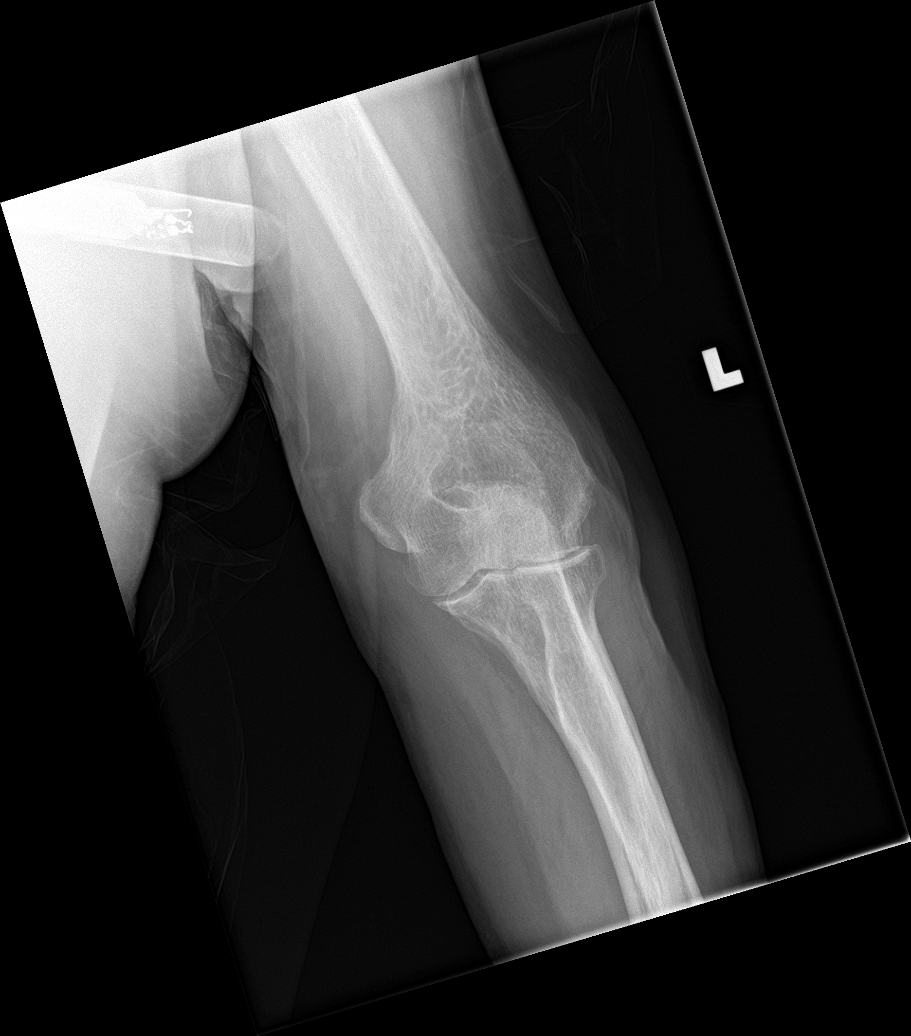

[elbow obl (1 of 4)]
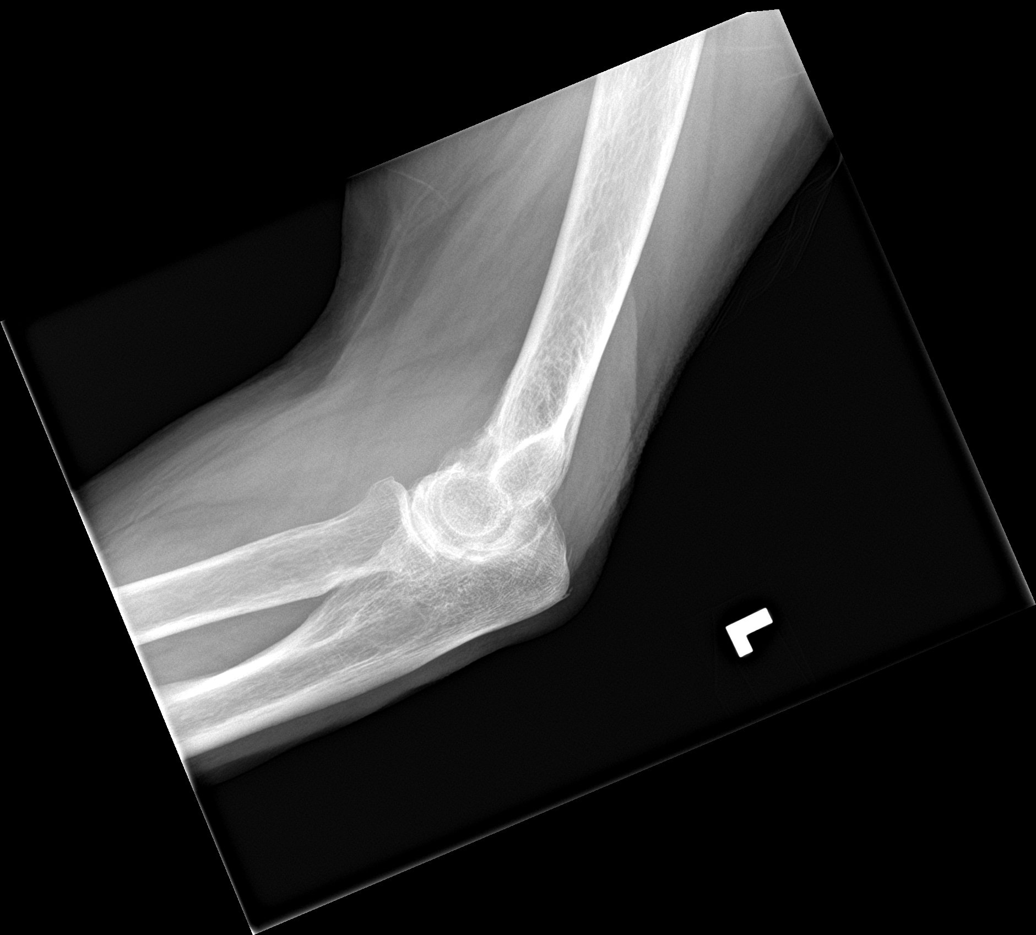

[elbow obl (2 of 4)]
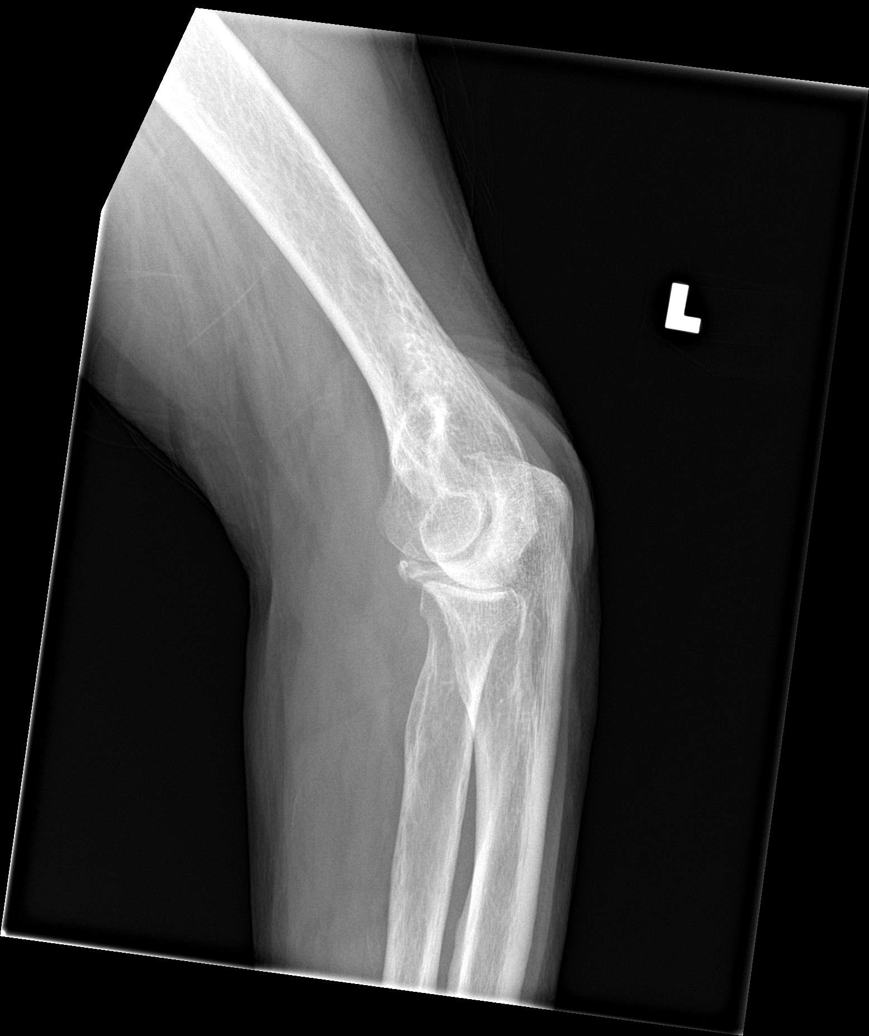

[elbow lat]
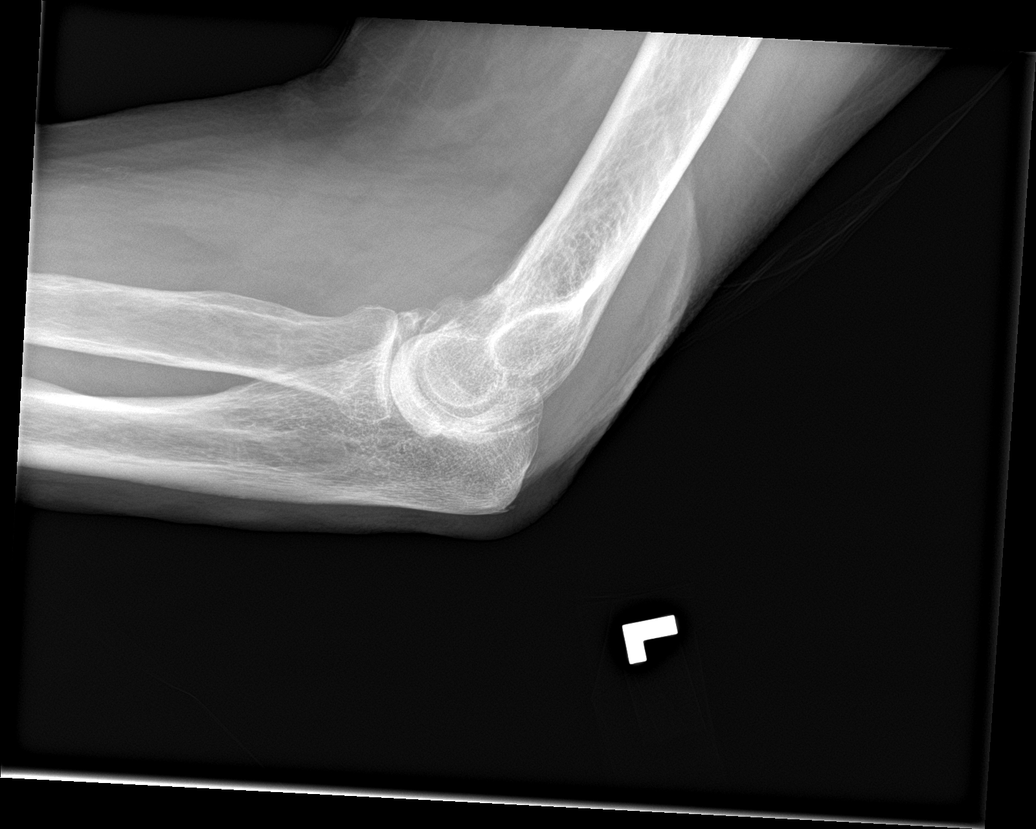

[elbow obl (3 of 4)]
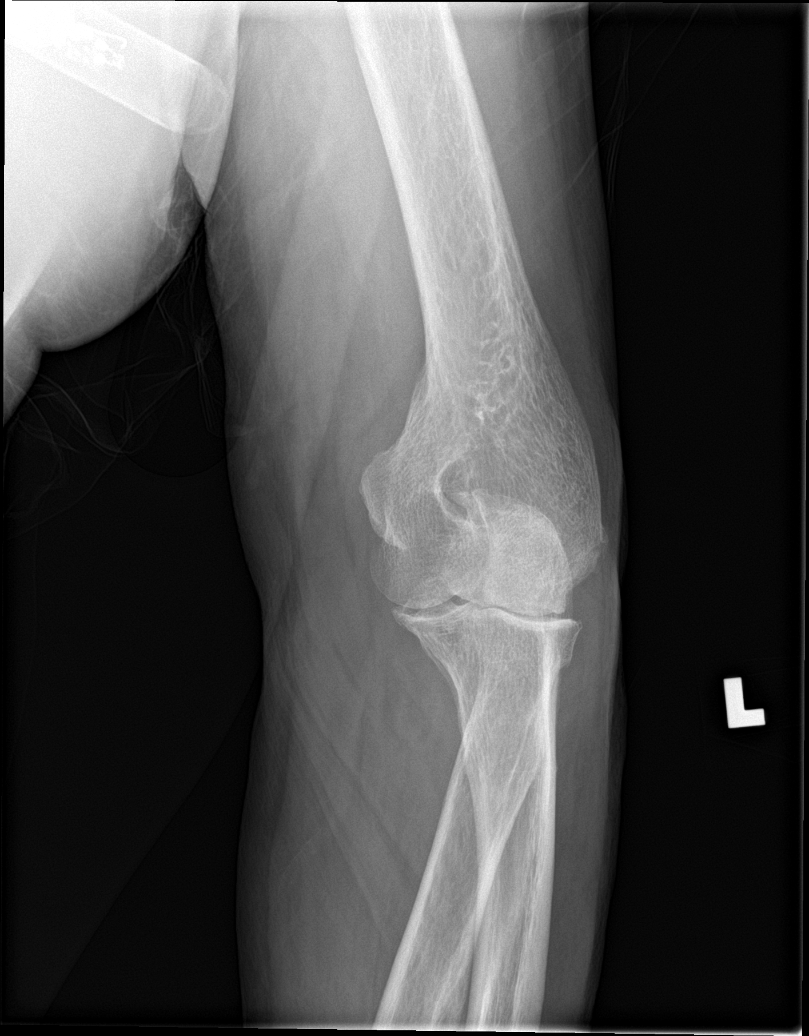

[elbow obl (4 of 4)]
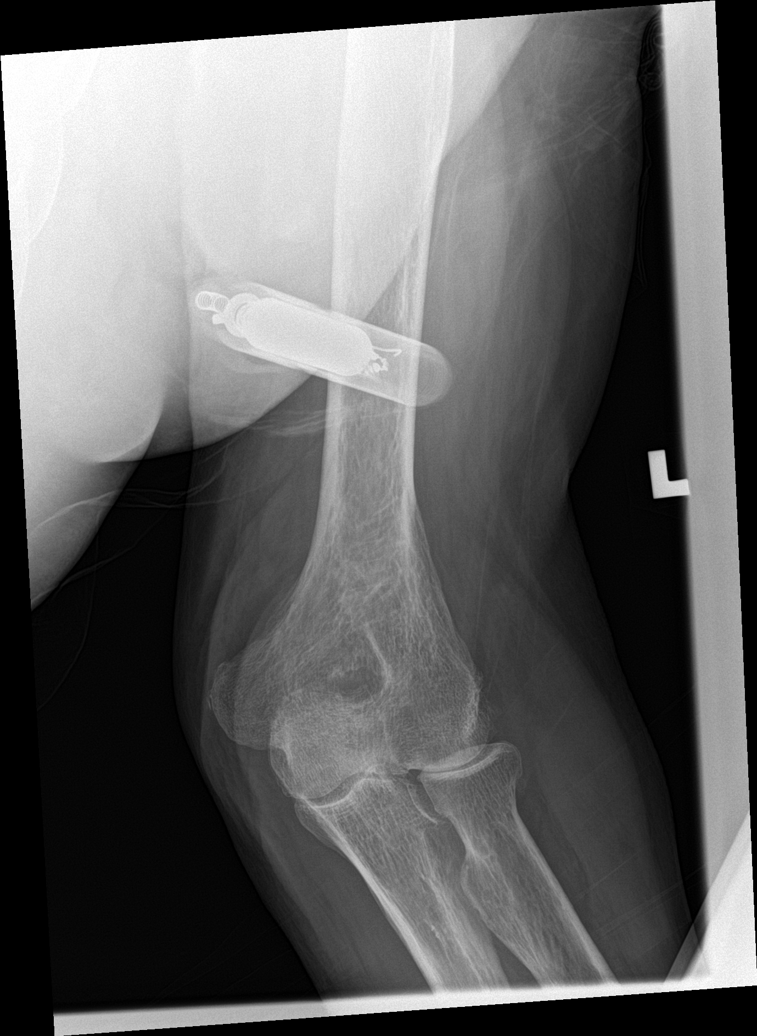

[6 of 6 positions shown; findings below may reference images not displayed]

FINDINGS: Mild degenerative changes of the elbow joint are noted. No acute
fracture or dislocation is seen. No soft tissue abnormality is
noted.
IMPRESSION: Degenerative change without acute abnormality.

## 2019-11-25 MED ORDER — SODIUM CHLORIDE 0.9 % IV SOLN
INTRAVENOUS | Status: DC | PRN
  Administered 2019-11-25: 18:00:00 250 mL via INTRAVENOUS

## 2019-11-25 MED ORDER — ALBUTEROL SULFATE HFA 108 (90 BASE) MCG/ACT IN AERS
2.0000 | INHALATION_SPRAY | Freq: Four times a day (QID) | RESPIRATORY_TRACT | Status: DC | PRN
  Filled 2019-11-25: qty 6.7

## 2019-11-25 MED ORDER — SODIUM CHLORIDE 0.9 % IV BOLUS
500.0000 mL | Freq: Once | INTRAVENOUS | Status: AC
  Administered 2019-11-25: 500 mL via INTRAVENOUS

## 2019-11-25 MED ORDER — SODIUM CHLORIDE 0.9 % IV SOLN
1.0000 g | INTRAVENOUS | Status: DC
  Administered 2019-11-25 – 2019-11-26 (×2): 1 g via INTRAVENOUS
  Filled 2019-11-25 (×2): qty 10

## 2019-11-25 MED ORDER — HYDROCODONE-ACETAMINOPHEN 5-325 MG PO TABS
1.0000 | ORAL_TABLET | Freq: Once | ORAL | Status: AC
  Administered 2019-11-26: 1 via ORAL
  Filled 2019-11-25: qty 1

## 2019-11-25 MED ORDER — LEVOFLOXACIN IN D5W 750 MG/150ML IV SOLN: 750.0000 mg | Freq: Once | INTRAVENOUS | Status: DC

## 2019-11-25 MED ORDER — SODIUM CHLORIDE 0.9 % IV SOLN
500.0000 mg | INTRAVENOUS | Status: DC
  Administered 2019-11-25: 500 mg via INTRAVENOUS
  Filled 2019-11-25 (×2): qty 500

## 2019-11-25 NOTE — ED Provider Notes (Addendum)
[facility] EMERGENCY DEPARTMENT Provider Note   CSN: [id number] Arrival date & time: 11/25/19  1624     History Chief Complaint  Patient presents with  . Shortness of Breath    Shaun Yoder is a 74 y.o. male history DVT on Eliquis, GERD, hyperlipidemia, hypertension, obesity, nocturnal hypoxemia, aortic stenosis.  Patient presents today for shortness of breath, SPO2 low 80s at home.  Patient and his wife report that he has had increased coughing over the past 2 days, patient seen by physical therapy today who were concerned for possible right-sided pneumonia and sent him to the ER for evaluation.  Patient describes cough as dark yellow and occasionally blood-tinged without associated chest pain.  Reports difficulty catching his breath with any activity, denies similar in the past.  There reports subjective fever at home but has not measured a temperature.  Patient usually only uses oxygen at night.  Patient denies headache, vision change, chest pain, pleurisy, abdominal pain, nausea/vomiting, diarrhea, abnormal extremity swelling/color change, fall/injury or any additional concerns.  Patient does report diffuse pain secondary to his arthritis without change.  Of note patient reports that he received both of his [employer] vaccines with the second dose at the end of March. HPI     Past Medical History:  Diagnosis Date  . Anemia   . Cancer ([place])    bladder  . Chronic venous insufficiency   . Colitis   . Colon polyps   . Depression   . Diverticulosis   . DVT of deep femoral vein ([place])   . Gastritis   . GERD (gastroesophageal reflux disease)   . History of rectal polyps   . Hyperlipidemia   . Hypertension   . Iron deficiency   . Low back pain   . Lymphedema of both lower extremities   . OA (osteoarthritis)   . 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 Schizoaffective disorder, bipolar type ([place])   . Sleep apnea   . Varicose vein     Patient Active  Problem List   Diagnosis Date Noted  . CAP (community acquired pneumonia) 11/25/2019  . Chronic respiratory failure with hypoxia ([place]) 03/19/2018  . Bacteremia due to methicillin susceptible Staphylococcus aureus (MSSA) 12/26/2017  . Cellulitis of left leg 12/25/2017  . Sepsis ([place]) 08/28/2017  . Nocturnal hypoxemia 02/11/2017  . DJD (degenerative joint disease) 12/06/2016  . Chronic right shoulder pain 12/06/2016  . Contracture of joint of right shoulder region 12/06/2016  . Chronic left hip pain 09/12/2016  . Status post bilateral knee replacements 09/12/2016  . Gait disorder 06/03/2016  . Left leg DVT ([place]) 04/04/2016  . Non-pressure chronic ulcer of right ankle with fat layer exposed ([place]) 10/25/2015  . Varicose veins of lower extremities with ulcer ([place]) 09/07/2015  . Annual physical exam 05/23/2015  . PCP NOTES >>>>>>>>>>>>>>>>> 03/28/2015  . Hypertension 03/28/2015  . Pulmonary nodules 12/09/2014  . Sleep apnea 11/14/2014  . GERD (gastroesophageal reflux disease) 11/14/2014  . Morbid obesity ([place]) 11/14/2014  . Aortic stenosis, severe   . Schizoaffective disorder, bipolar type ([place])   . Rheumatoid arthritis ([place])   . Hyperlipidemia   . Bifasicular block   . Mononeuritis of upper limb 05/20/2013  . Pain in soft tissues of limb 05/20/2013  . Idiopathic progressive polyneuropathy 05/20/2013  . 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 Depression 01/13/2013  . Memory loss 01/13/2013    Past Surgical History:  Procedure Laterality Date  . BLADDER SURGERY    .  TOTAL KNEE ARTHROPLASTY Bilateral   . VARICOSE VEIN SURGERY         Family History  Problem Relation Age of Onset  . Heart attack Mother   . Heart attack Father   . Stroke Father   . Rheumatologic disease Neg Hx   . Colon cancer Neg Hx   . Lung disease Neg Hx   . Prostate cancer Neg Hx     Social History   Tobacco Use  . Smoking status: Former Smoker    Packs/day: 2.50    Years: 43.00    Pack years:  107.50    Types: Cigarettes    Quit date: 07/22/1998    Years since quitting: 21.3  . Smokeless tobacco: Never Used  Substance Use Topics  . Alcohol use: Not Currently    Alcohol/week: 0.0 standard drinks  . Drug use: No    Home Medications Prior to Admission medications   Medication Sig Start Date End Date Taking? Authorizing Provider  Adalimumab (HUMIRA) 40 MG/0.4ML PSKT Inject into the skin.    [provider]  albuterol (PROVENTIL) (2.5 MG/3ML) 0.083% nebulizer solution Take 3 mLs (2.5 mg total) by nebulization 4 (four) times daily as needed for wheezing or shortness of breath. 08/13/19   Colon Branch, MD  albuterol (VENTOLIN HFA) 108 (90 Base) MCG/ACT inhaler Inhale 2 puffs into the lungs every 6 (six) hours as needed for wheezing or shortness of breath. 07/13/19   Colon Branch, MD  apixaban (ELIQUIS) 5 MG TABS tablet Take 2.5 mg by mouth 2 (two) times daily.    [provider]  ARIPiprazole (ABILIFY) 30 MG tablet Take 30 mg by mouth at bedtime.      [provider]  aspirin EC 81 MG tablet Take 81 mg by mouth daily.    [provider]  augmented betamethasone dipropionate (DIPROLENE-AF) 0.05 % cream Apply topically 2 (two) times daily. Apply to the rash of the right side of the face 01/19/18   Colon Branch, MD  calcium-vitamin D (CALCIUM 500/D) 500-200 MG-UNIT tablet Take 1 tablet by mouth daily with breakfast.     [provider]  clotrimazole (LOTRIMIN) 1 % cream Apply 1 application topically daily as needed.    [provider]  divalproex (DEPAKOTE ER) 500 MG 24 hr tablet Take 1,000 mg by mouth daily. 3 tabs at bedtime    [provider]  divalproex (DEPAKOTE) 250 MG DR tablet Take 250 mg by mouth daily with lunch.     [provider]  folic acid (FOLVITE) 1 MG tablet Take 1 mg by mouth daily.      [provider]  ketoconazole (NIZORAL) 2 % cream Apply 1 application topically daily. 09/16/18   Colon Branch, MD    mirabegron ER (MYRBETRIQ) 50 MG TB24 tablet Take 50 mg by mouth daily.    [provider]  Omega-3 1000 MG CAPS Take 1 capsule by mouth daily.     [provider]  omeprazole (PRILOSEC) 20 MG capsule Take 20 mg by mouth every morning.     [provider]  oxybutynin (DITROPAN) 5 MG tablet Take 5 mg by mouth 3 (three) times daily.     [provider]  oxyCODONE-acetaminophen (PERCOCET/ROXICET) 5-325 MG tablet Take 1 tablet by mouth every 6 (six) hours as needed for moderate pain.    [provider]  predniSONE (DELTASONE) 5 MG tablet Take 5 mg by mouth daily with breakfast.  [provider]  triamcinolone cream (KENALOG) 0.1 % Apply 1 application topically 2 (two) times daily.    [provider]  vitamin C (ASCORBIC ACID) 500 MG tablet Take 500 mg by mouth daily.    [provider]  ziprasidone (GEODON) 20 MG capsule Take 20 mg by mouth daily at 12 noon.    [provider]  ziprasidone (GEODON) 80 MG capsule Take 80 mg by mouth at bedtime.     [provider]    Allergies    Leflunomide, Ancef [cefazolin], Morphine and related, and Plaquenil [hydroxychloroquine sulfate]  Review of Systems   Review of Systems Ten systems are reviewed and are negative for acute change except as noted in the HPI  Physical Exam Updated Vital Signs BP (!) 84/56 (BP Location: Right Arm)   Pulse 70   Temp 99.3 F (37.4 C) (Oral)   Resp 18   Ht 5\' 1"  (1.549 m)   Wt 77.1 kg   SpO2 94%   BMI 32.12 kg/m   Physical Exam Constitutional:      General: He is not in acute distress.    Appearance: Normal appearance. He is well-developed. He is ill-appearing (Chronically ill-appearing). He is not toxic-appearing or diaphoretic.  HENT:     Head: Normocephalic and atraumatic.     Right Ear: External ear normal.     Left Ear: External ear normal.     Nose: Nose normal.  Eyes:     General: Vision grossly intact. Gaze  aligned appropriately.     Pupils: Pupils are equal, round, and reactive to light.  Neck:     Trachea: Trachea and phonation normal. No tracheal deviation.  Cardiovascular:     Rate and Rhythm: Normal rate and regular rhythm.  Pulmonary:     Effort: Pulmonary effort is normal. No accessory muscle usage or respiratory distress.     Breath sounds: Rhonchi (Right-sided) present.  Abdominal:     General: There is no distension.     Palpations: Abdomen is soft.     Tenderness: There is no abdominal tenderness. There is no guarding or rebound.  Musculoskeletal:        General: Normal range of motion.     Cervical back: Normal range of motion.     Right lower leg: No tenderness.     Left lower leg: No tenderness.     Comments: Bilateral lower extremity edema which patient and wife report is baseline, appear symmetric.  Skin:    General: Skin is warm and dry.  Neurological:     Mental Status: He is alert.     GCS: GCS eye subscore is 4. GCS verbal subscore is 5. GCS motor subscore is 6.     Comments: Speech is clear and goal oriented, follows commands Major Cranial nerves without deficit, no facial droop Moves extremities without ataxia, coordination intact  Psychiatric:        Behavior: Behavior normal.     ED Results / Procedures / Treatments   Labs (all labs ordered are listed, but only abnormal results are displayed) Labs Reviewed  BASIC METABOLIC PANEL - Abnormal; Notable for the following components:      Result Value   Glucose, Bld 103 (*)    BUN 24 (*)    Creatinine, Ser 0.46 (*)    Calcium 8.1 (*)    All other components within normal limits  CBC WITH DIFFERENTIAL/PLATELET - Abnormal; Notable for the following components:   WBC 11.2 (*)  RBC 3.89 (*)    Hemoglobin 12.4 (*)    HCT 37.1 (*)    Platelets 124 (*)    Neutro Abs 9.3 (*)    All other components within normal limits  BRAIN NATRIURETIC PEPTIDE - Abnormal; Notable for the following components:   B  Natriuretic Peptide 1,256.6 (*)    All other components within normal limits  APTT - Abnormal; Notable for the following components:   aPTT 75 (*)    All other components within normal limits  PROTIME-INR - Abnormal; Notable for the following components:   Prothrombin Time 16.3 (*)    INR 1.4 (*)    All other components within normal limits  TROPONIN I (HIGH SENSITIVITY) - Abnormal; Notable for the following components:   Troponin I (High Sensitivity) 23 (*)    All other components within normal limits  RESPIRATORY PANEL BY RT PCR (FLU A&B, COVID)  CULTURE, BLOOD (ROUTINE X 2)  CULTURE, BLOOD (ROUTINE X 2)  URINE CULTURE  LACTIC ACID, PLASMA  LACTIC ACID, PLASMA  URINALYSIS, ROUTINE W REFLEX MICROSCOPIC  TROPONIN I (HIGH SENSITIVITY)    EKG EKG Interpretation  Date/Time:  Thursday Nov 25 2019 17:47:59 EDT Ventricular Rate:  72 PR Interval:    QRS Duration: 191 QT Interval:  490 QTC Calculation: 537 R Axis:   -97 Text Interpretation: Sinus rhythm Left atrial enlargement RBBB and LAFB Confirmed by Virgel Manifold [phone]) on 11/25/2019 6:35:37 PM   Radiology DG Chest Portable 1 View  Result Date: 11/25/2019 CLINICAL DATA:  Shortness of breath. EXAM: PORTABLE CHEST 1 VIEW COMPARISON:  10/29/2019 FINDINGS: There is new consolidation involving the right mid and right lower lung zones. There is no pneumothorax. There is a small right-sided pleural effusion. There is some atelectasis versus scarring at the lung bases bilaterally. Chronic changes are noted of the bilateral humeral heads. The heart size is stable. IMPRESSION: 1. New consolidation in the right mid and lower lung zones consistent with pneumonia. 2. Small right-sided pleural effusion. Electronically Signed   By: Constance Holster M.D.   On: 11/25/2019 17:29    Procedures .Critical Care Performed by: Deliah Boston, PA-C Authorized by: Deliah Boston, PA-C   Critical care provider statement:    Critical care  time (minutes):  40   Critical care was necessary to treat or prevent imminent or life-threatening deterioration of the following conditions: Hypoxic respiratory failure requiring supplemental O2.   Critical care was time spent personally by me on the following activities:  Discussions with consultants, evaluation of patient's response to treatment, examination of patient, ordering and performing treatments and interventions, ordering and review of laboratory studies, ordering and review of radiographic studies, pulse oximetry, re-evaluation of patient's condition, obtaining history from patient or surrogate, review of old charts and development of treatment plan with patient or surrogate   (including critical care time)  Medications Ordered in ED Medications  cefTRIAXone (ROCEPHIN) 1 g in sodium chloride 0.9 % 100 mL IVPB ( Intravenous Stopped 11/25/19 1856)  azithromycin (ZITHROMAX) 500 mg in sodium chloride 0.9 % 250 mL IVPB (500 mg Intravenous New Bag/Given 11/25/19 1905)  0.9 %  sodium chloride infusion (250 mLs Intravenous New Bag/Given 11/25/19 1824)  sodium chloride 0.9 % bolus 500 mL (500 mLs Intravenous New Bag/Given 11/25/19 1820)    ED Course  I have reviewed the triage vital signs and the nursing notes.  Pertinent labs & imaging results that were available during my care of the patient were reviewed by  me and considered in my medical decision making (see chart for details).  Shaun Yoder was evaluated in Emergency Department on 11/25/2019 for the symptoms described in the history of present illness. He was evaluated in the context of the global COVID-19 pandemic, which necessitated consideration that the patient might be at risk for infection with the SARS-CoV-2 virus that causes COVID-19. Institutional protocols and algorithms that pertain to the evaluation of patients at risk for COVID-19 are in a state of rapid change based on information released by regulatory bodies including the CDC  and federal and state organizations. These policies and algorithms were followed during the patient's care in the ED.  Clinical Course as of Nov 25 2018  Thu Nov 25, 2019  2004 Dr. Sabino Gasser   [BM]    Clinical Course User Index [BM] Gari Crown    MDM Rules/Calculators/A&P                     74 year old male arrives short of breath new oxygen requirement sats in the mid 80s on room air improved with nasal cannula.  He is chronically ill-appearing no acute distress reports shortness of breath primarily with activity.  Productive cough over the last 2 days and subjective fevers at home.  On arrival he is afebrile not tachycardic not tachypneic, blood pressure soft but mentating appropriately.  Patient does not currently appear septic but clinically I am concerned for pneumonia.  Will obtain chest x-ray and labs including CBC, BMP, APTT, PT/INR, Covid test, troponin, BNP, lactic, blood cultures, EKG and reassess.  Given patient compliant with Eliquis doubt PE at this time.  500 mL fluid bolus ordered. - CXR:  IMPRESSION:  1. New consolidation in the right mid and lower lung zones  consistent with pneumonia.  2. Small right-sided pleural effusion.  I personally viewed patient's chest x-ray and agree with radiologist interpretation.  Will start coverage for community-acquired pneumonia. - I have reviewed and interpreted the following labs.  CBC shows mild leukocytosis of 11.2 suspect secondary to pneumonia, hemoglobin of 12.4 appears slightly decreased from prior.  APTT and PT/INR mildly elevated.  BMP shows no emergent electrolyte derangement or acute kidney injury.  High-sensitivity troponin of 23 and BNP of 1256 which appear new.  Will need to be trended.  Patient does not sound fluid overloaded on exam but will avoid further fluid boluses at this time, may be secondary to infection only admission.  Covid/influenza panel negative.  Lactic of 1.3 is reassuring. Patient's blood pressure  soft, mentating appropriately, will avoid large fluid boluses given elevation of BNP, will continue to monitor, previous blood pressures reviewed and are also on the lower side. Discussed case with Dr. Wilson Singer who agrees with admission.  Patient not tachycardic and chest pain or pleurisy, on Eliquis suspicion for PE at this time. Consult called to hospitalist.  ------------------------- Patient reassessed he is resting comfortably no acute distress, reports that he is feeling somewhat better.  He has no complaints at this time.  He states understanding of care plan and is agreeable for admission. - 8:04 PM: Discussed case with Dr. Sabino Gasser, patient has been accepted to hospitalist service.   Note: Portions of this report may have been transcribed using voice recognition software. Every effort was made to ensure accuracy; however, inadvertent computerized transcription errors may still be present. Final Clinical Impression(s) / ED Diagnoses Final diagnoses:  Community acquired pneumonia of right lung, unspecified part of lung  Hypoxia  Rx / DC Orders ED Discharge Orders    None       Gari Crown 11/25/19 2020    Deliah Boston, PA-C 11/25/19 2020    Virgel Manifold, MD 11/26/19 Tresa Moore

## 2019-11-25 NOTE — ED Notes (Signed)
Pt asymptomatic with hypotension; EDP is aware of VS; no further orders at this time.

## 2019-11-25 NOTE — ED Notes (Signed)
Snacks and drinks provided to pt and wife.

## 2019-11-25 NOTE — ED Triage Notes (Signed)
Pt had PT today, needed oxygen due to low sats 88%.  Coughing, recent nose bleed, PT tech thought he sounded congested, advised by pmd to be evaluated for possible pneumonia.  Pt speaking in complete sentences in triage, arrives in personal  Motorized wheelchair.  Recently completed antibiotic.  Sat in triage 91%

## 2019-11-25 NOTE — ED Notes (Signed)
Pt remains asymptomatic with hypotension; requesting a snack and drink

## 2019-11-25 NOTE — Progress Notes (Signed)
Placed patient on 2 liter nasal cannula QHS per his home regimen.  Patient's SPO2 increased to 97%.  RT will continue to monitor.

## 2019-11-26 ENCOUNTER — Encounter (HOSPITAL_COMMUNITY): Payer: Self-pay | Admitting: Internal Medicine

## 2019-11-26 DIAGNOSIS — I350 Nonrheumatic aortic (valve) stenosis: Secondary | ICD-10-CM

## 2019-11-26 DIAGNOSIS — G8929 Other chronic pain: Secondary | ICD-10-CM

## 2019-11-26 DIAGNOSIS — J189 Pneumonia, unspecified organism: Secondary | ICD-10-CM

## 2019-11-26 DIAGNOSIS — J9621 Acute and chronic respiratory failure with hypoxia: Secondary | ICD-10-CM | POA: Diagnosis present

## 2019-11-26 DIAGNOSIS — M25552 Pain in left hip: Secondary | ICD-10-CM

## 2019-11-26 MED ORDER — APIXABAN 2.5 MG PO TABS
2.5000 mg | ORAL_TABLET | Freq: Two times a day (BID) | ORAL | Status: DC
  Administered 2019-11-26 – 2019-12-01 (×10): 2.5 mg via ORAL
  Filled 2019-11-26 (×11): qty 1

## 2019-11-26 MED ORDER — ADULT MULTIVITAMIN W/MINERALS CH
1.0000 | ORAL_TABLET | Freq: Every day | ORAL | Status: DC
  Administered 2019-11-26 – 2019-12-01 (×6): 1 via ORAL
  Filled 2019-11-26 (×6): qty 1

## 2019-11-26 MED ORDER — ENOXAPARIN SODIUM 40 MG/0.4ML ~~LOC~~ SOLN
40.0000 mg | SUBCUTANEOUS | Status: DC
Start: 2019-11-26 — End: 2019-11-26
  Administered 2019-11-26: 40 mg via SUBCUTANEOUS
  Filled 2019-11-26: qty 0.4

## 2019-11-26 MED ORDER — SODIUM CHLORIDE 0.9% FLUSH: 3.0000 mL | INTRAVENOUS | Status: DC | PRN

## 2019-11-26 MED ORDER — SENNA 8.6 MG PO TABS
1.0000 | ORAL_TABLET | Freq: Two times a day (BID) | ORAL | Status: DC
  Administered 2019-11-26 – 2019-12-01 (×7): 8.6 mg via ORAL
  Filled 2019-11-26 (×8): qty 1

## 2019-11-26 MED ORDER — ACETAMINOPHEN 650 MG RE SUPP: 650.0000 mg | Freq: Four times a day (QID) | RECTAL | Status: DC | PRN

## 2019-11-26 MED ORDER — OXYBUTYNIN CHLORIDE 5 MG PO TABS
5.0000 mg | ORAL_TABLET | Freq: Three times a day (TID) | ORAL | Status: DC
  Administered 2019-11-27 – 2019-12-01 (×13): 5 mg via ORAL
  Filled 2019-11-26 (×17): qty 1

## 2019-11-26 MED ORDER — SODIUM CHLORIDE 0.9% FLUSH
3.0000 mL | Freq: Two times a day (BID) | INTRAVENOUS | Status: DC
  Administered 2019-11-26 – 2019-12-01 (×6): 3 mL via INTRAVENOUS

## 2019-11-26 MED ORDER — SODIUM CHLORIDE 0.9 % IV SOLN: 250.0000 mL | INTRAVENOUS | Status: DC | PRN

## 2019-11-26 MED ORDER — ALBUTEROL SULFATE (2.5 MG/3ML) 0.083% IN NEBU: 2.5000 mg | INHALATION_SOLUTION | Freq: Four times a day (QID) | RESPIRATORY_TRACT | Status: DC | PRN

## 2019-11-26 MED ORDER — DOXYCYCLINE HYCLATE 100 MG PO TABS
100.0000 mg | ORAL_TABLET | Freq: Two times a day (BID) | ORAL | Status: DC
  Administered 2019-11-26 – 2019-11-27 (×3): 100 mg via ORAL
  Filled 2019-11-26 (×3): qty 1

## 2019-11-26 MED ORDER — ALBUTEROL SULFATE (2.5 MG/3ML) 0.083% IN NEBU: 2.5000 mg | INHALATION_SOLUTION | RESPIRATORY_TRACT | Status: DC | PRN

## 2019-11-26 MED ORDER — OXYCODONE-ACETAMINOPHEN 5-325 MG PO TABS
1.0000 | ORAL_TABLET | Freq: Four times a day (QID) | ORAL | Status: DC | PRN
  Administered 2019-11-26 – 2019-12-01 (×10): 1 via ORAL
  Filled 2019-11-26 (×11): qty 1

## 2019-11-26 MED ORDER — ZIPRASIDONE HCL 20 MG PO CAPS
20.0000 mg | ORAL_CAPSULE | Freq: Every day | ORAL | Status: DC
  Administered 2019-11-27: 20 mg via ORAL
  Filled 2019-11-26 (×3): qty 1

## 2019-11-26 MED ORDER — PANTOPRAZOLE SODIUM 40 MG PO TBEC
40.0000 mg | DELAYED_RELEASE_TABLET | Freq: Every day | ORAL | Status: DC
  Administered 2019-11-26 – 2019-12-01 (×6): 40 mg via ORAL
  Filled 2019-11-26 (×5): qty 1

## 2019-11-26 MED ORDER — ARIPIPRAZOLE 10 MG PO TABS
30.0000 mg | ORAL_TABLET | Freq: Every day | ORAL | Status: AC
  Administered 2019-11-26 – 2019-11-30 (×5): 30 mg via ORAL
  Filled 2019-11-26 (×5): qty 3

## 2019-11-26 MED ORDER — POLYETHYLENE GLYCOL 3350 17 G PO PACK: 17.0000 g | PACK | Freq: Every day | ORAL | Status: DC | PRN

## 2019-11-26 MED ORDER — DIVALPROEX SODIUM 250 MG PO DR TAB
250.0000 mg | DELAYED_RELEASE_TABLET | Freq: Every day | ORAL | Status: DC
  Administered 2019-11-27 – 2019-12-01 (×5): 250 mg via ORAL
  Filled 2019-11-26 (×5): qty 1

## 2019-11-26 MED ORDER — PREDNISONE 10 MG PO TABS
10.0000 mg | ORAL_TABLET | Freq: Every day | ORAL | Status: DC
  Administered 2019-11-26 – 2019-12-01 (×6): 10 mg via ORAL
  Filled 2019-11-26 (×6): qty 1

## 2019-11-26 MED ORDER — ZIPRASIDONE HCL 40 MG PO CAPS
80.0000 mg | ORAL_CAPSULE | Freq: Every day | ORAL | Status: DC
  Administered 2019-11-26 – 2019-11-27 (×2): 80 mg via ORAL
  Filled 2019-11-26: qty 1
  Filled 2019-11-26 (×2): qty 2

## 2019-11-26 MED ORDER — GUAIFENESIN ER 600 MG PO TB12
600.0000 mg | ORAL_TABLET | Freq: Two times a day (BID) | ORAL | Status: DC
  Administered 2019-11-26 – 2019-12-01 (×11): 600 mg via ORAL
  Filled 2019-11-26 (×11): qty 1

## 2019-11-26 MED ORDER — ASPIRIN EC 81 MG PO TBEC
81.0000 mg | DELAYED_RELEASE_TABLET | Freq: Every day | ORAL | Status: DC
  Administered 2019-11-27 – 2019-12-01 (×5): 81 mg via ORAL
  Filled 2019-11-26 (×5): qty 1

## 2019-11-26 MED ORDER — MIRABEGRON ER 50 MG PO TB24
50.0000 mg | ORAL_TABLET | Freq: Every day | ORAL | Status: DC
  Administered 2019-11-27 – 2019-12-01 (×5): 50 mg via ORAL
  Filled 2019-11-26 (×6): qty 1

## 2019-11-26 MED ORDER — TRIAMCINOLONE ACETONIDE 0.1 % EX CREA
1.0000 "application " | TOPICAL_CREAM | Freq: Two times a day (BID) | CUTANEOUS | Status: DC | PRN
  Filled 2019-11-26: qty 15

## 2019-11-26 MED ORDER — DIVALPROEX SODIUM ER 500 MG PO TB24
1000.0000 mg | ORAL_TABLET | Freq: Every day | ORAL | Status: DC
  Administered 2019-11-26 – 2019-11-30 (×5): 1000 mg via ORAL
  Filled 2019-11-26 (×5): qty 2

## 2019-11-26 MED ORDER — ASCORBIC ACID 500 MG PO TABS
500.0000 mg | ORAL_TABLET | Freq: Every day | ORAL | Status: DC
  Administered 2019-11-26 – 2019-12-01 (×6): 500 mg via ORAL
  Filled 2019-11-26 (×6): qty 1

## 2019-11-26 MED ORDER — ACETAMINOPHEN 325 MG PO TABS
650.0000 mg | ORAL_TABLET | Freq: Four times a day (QID) | ORAL | Status: DC | PRN
  Administered 2019-11-26: 15:00:00 650 mg via ORAL
  Filled 2019-11-26: qty 2

## 2019-11-26 NOTE — Progress Notes (Signed)
[facility] transfer acceptance note  Transferred from: [facility] Transferring MD: Dr. Laverta Baltimore, EDP Accepting MD: Dr. Algis Liming  74 year old male with PMH of DVT on Eliquis, GERD, hyperlipidemia, hypertension, obesity, nocturnal hypoxemia on oxygen 2 L/min, aortic stenosis presented to [facility] on 5/6 due to productive cough, subjective fevers, dyspnea and new hypoxia.  Diagnosed with lobar pneumonia and initiated on IV antibiotics and oxygen support.  His case had been discussed with [facility] MD last night, he was accepted in transfer to [facility] medical bed.  At this time patient has been in the ED for over 16 hours and Dr. Laverta Baltimore plans to transfer him as an ED to ED transfer to [facility] and request change of admit orders.  After discussing with Dr. Laverta Baltimore and [facility], changed admission to [facility], telemetry bed, remains inpatient status.  Soft BPs noted but asymptomatic, Dr. Laverta Baltimore agrees that he needs closer monitoring and hence changed to telemetry bed.  Will need admit orders and H&P on arrival to [facility]  I did briefly review chart.  Vernell Leep, MD, [place], [facility]. [facility]  To contact the attending provider between 7A-7P or the covering provider during after hours 7P-7A, please log into the web site [email or web address] and access using universal [place] password for that web site. If you do not have the password, please call the hospital operator.

## 2019-11-26 NOTE — ED Notes (Addendum)
Assisted patient with urinal and cleanup. Urine noted at tea colored.348ml

## 2019-11-26 NOTE — H&P (Addendum)
History and Physical    Shaun Yoder [id number] DOB: [birth year] DOA: 11/25/2019  PCP: Colon Branch, MD   I have briefly reviewed patients previous medical reports in [facility].  Patient coming from: Home  Chief Complaint: Productive cough, dyspnea and hypoxia  HPI: Shaun Yoder is a 74 year old married male, lives with his spouse and daughter, nonambulatory at baseline, has an Conservator, museum/gallery, hospital bed and supposed to get a Engineer, drilling lift soon via the [place], extensive PMH including iron deficiency anemia, lower extremity chronic venous insufficiency/varicose veins, history of DVT and uncertain if still on apixaban (awaiting home medications review by pharmacy), depression, GERD, HLD, HTN, schizoaffective disorder, sleep apnea but not on CPAP, chronic respiratory failure with hypoxia on nocturnal oxygen 2 L/min, rheumatoid arthritis, severe aortic stenosis, prior MSSA bacteremia and chronic left hip pain initially presented to [facility] ED on 11/25/2019 due to productive cough, dyspnea and oxygen saturations in the low 80s at home as per home health PT.  His cough has been ongoing for 2 days, brown color.  He states that he stays cold all the time.  Denies fever or chest pain.  Reports great appetite.  No dizziness, lightheadedness.  Denies choking or coughing with eating or drinking.  Denies sick contacts.  He reports that he completed 2 doses of the [employer] vaccine at the end of March.  ED Course: Afebrile, not tachypneic or tachycardic, soft but asymptomatic blood pressures (mostly in the 90s/50s), hypoxic to 88% on room air and has been on nasal cannula oxygen 2 L.  Lab work shows unremarkable BMP, BNP one 256.6, troponin in the low 20s with a flat trend, lactate 1.3, WBC 11.2, platelets 124.  Flu panel and Covid PCR testing negative.  Urine microscopy unremarkable.  Blood and urine cultures drawn and pending.  Chest x-ray personally reviewed and has been reported as new  consolidation in the right middle and lower lung zones consistent with pneumonia.  Small right-sided pleural effusion.  Review of Systems:  All other systems reviewed and apart from HPI, are negative.  Past Medical History:  Diagnosis Date  . Anemia   . Cancer ([place])    bladder  . Chronic venous insufficiency   . Colitis   . Colon polyps   . Depression   . Diverticulosis   . DVT of deep femoral vein ([place])   . Gastritis   . GERD (gastroesophageal reflux disease)   . History of rectal polyps   . Hyperlipidemia   . Hypertension   . Iron deficiency   . Low back pain   . Lymphedema of both lower extremities   . OA (osteoarthritis)   . 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 Schizoaffective disorder, bipolar type ([place])   . Sleep apnea   . Varicose vein     Past Surgical History:  Procedure Laterality Date  . BLADDER SURGERY    . TOTAL KNEE ARTHROPLASTY Bilateral   . VARICOSE VEIN SURGERY      Social History  reports that he quit smoking about 21 years ago. His smoking use included cigarettes. He has a 107.50 pack-year smoking history. He has never used smokeless tobacco. He reports previous alcohol use. He reports that he does not use drugs.  Allergies  Allergen Reactions  . Leflunomide Other (See Comments)    diarrhea  . Ancef [Cefazolin]     Rash   . Morphine And Related Nausea And Vomiting  . Plaquenil [Hydroxychloroquine Sulfate]  rash    Family History  Problem Relation Age of Onset  . Heart attack Mother   . Heart attack Father   . Stroke Father   . Rheumatologic disease Neg Hx   . Colon cancer Neg Hx   . Lung disease Neg Hx   . Prostate cancer Neg Hx      Prior to Admission medications   Medication Sig Start Date End Date Taking? Authorizing Provider  Adalimumab (HUMIRA) 40 MG/0.4ML PSKT Inject into the skin.    [provider]  albuterol (PROVENTIL) (2.5 MG/3ML) 0.083% nebulizer solution Take 3 mLs (2.5 mg total) by nebulization 4  (four) times daily as needed for wheezing or shortness of breath. 08/13/19   Colon Branch, MD  albuterol (VENTOLIN HFA) 108 (90 Base) MCG/ACT inhaler Inhale 2 puffs into the lungs every 6 (six) hours as needed for wheezing or shortness of breath. 07/13/19   Colon Branch, MD  apixaban (ELIQUIS) 5 MG TABS tablet Take 2.5 mg by mouth 2 (two) times daily.    [provider]  ARIPiprazole (ABILIFY) 30 MG tablet Take 30 mg by mouth at bedtime.      [provider]  aspirin EC 81 MG tablet Take 81 mg by mouth daily.    [provider]  augmented betamethasone dipropionate (DIPROLENE-AF) 0.05 % cream Apply topically 2 (two) times daily. Apply to the rash of the right side of the face 01/19/18   Colon Branch, MD  calcium-vitamin D (CALCIUM 500/D) 500-200 MG-UNIT tablet Take 1 tablet by mouth daily with breakfast.     [provider]  clotrimazole (LOTRIMIN) 1 % cream Apply 1 application topically daily as needed.    [provider]  divalproex (DEPAKOTE ER) 500 MG 24 hr tablet Take 1,000 mg by mouth daily. 3 tabs at bedtime    [provider]  divalproex (DEPAKOTE) 250 MG DR tablet Take 250 mg by mouth daily with lunch.     [provider]  folic acid (FOLVITE) 1 MG tablet Take 1 mg by mouth daily.      [provider]  ketoconazole (NIZORAL) 2 % cream Apply 1 application topically daily. 09/16/18   Colon Branch, MD  mirabegron ER (MYRBETRIQ) 50 MG TB24 tablet Take 50 mg by mouth daily.    [provider]  Omega-3 1000 MG CAPS Take 1 capsule by mouth daily.     [provider]  omeprazole (PRILOSEC) 20 MG capsule Take 20 mg by mouth every morning.     [provider]  oxybutynin (DITROPAN) 5 MG tablet Take 5 mg by mouth 3 (three) times daily.     [provider]  oxyCODONE-acetaminophen (PERCOCET) 10-325 MG tablet Take 1 tablet by mouth 4 (four) times daily as needed. 11/15/19   [provider]    oxyCODONE-acetaminophen (PERCOCET/ROXICET) 5-325 MG tablet Take 1 tablet by mouth every 6 (six) hours as needed for moderate pain.    [provider]  predniSONE (DELTASONE) 5 MG tablet Take 5 mg by mouth daily with breakfast.    [provider]  triamcinolone cream (KENALOG) 0.1 % Apply 1 application topically 2 (two) times daily.    [provider]  vitamin C (ASCORBIC ACID) 500 MG tablet Take 500 mg by mouth daily.    [provider]  ziprasidone (GEODON) 20 MG capsule Take 20 mg by mouth daily at 12 noon.    [provider]  ziprasidone (GEODON) 80 MG  capsule Take 80 mg by mouth at bedtime.     [provider]    Physical Exam: Vitals:   11/26/19 0830 11/26/19 0941 11/26/19 1153 11/26/19 1300  BP:  (!) 98/56 (!) 99/50   Pulse: 64 (!) 58 61   Resp: 17 16 12 20   Temp:  98.2 F (36.8 C) 97.7 F (36.5 C)   TempSrc:  Oral Oral   SpO2: 94% 98% 95%   Weight:      Height:          Constitutional: Pleasant elderly male, moderately built and frail, lying comfortably propped up in bed without distress. Eyes: PERTLA, lids and conjunctivae normal ENMT: Mucous membranes are moist. Posterior pharynx clear of any exudate or lesions. Normal dentition.  Neck: supple, no masses, no thyromegaly Respiratory: Reduced breath sounds in the right base >left with right base crackles.  Rest of lung fields clear to auscultation without wheezing or rhonchi.  No increased work of breathing.  Able to speak in full sentences. Cardiovascular: S1 & S2 heard, regular rate and rhythm. No JVD, murmurs, rubs or clicks.  Trace bilateral ankle edema which appears chronic. Abdomen: Non distended. Non tender. Soft. No organomegaly or masses appreciated. No clinical Ascites. Normal bowel sounds heard. Musculoskeletal: Restricted in what appears to be chronic bilateral shoulder elevation restriction.  Also difficulty moving his left lower extremity due to chronic left  hip pain/arthritis.  Has chronic hyperpigmentation and trace edema of bilateral legs with increased varicosity of veins and right leg.  Small superficial clean chronic ulcers on the left shin and the right leg which are clean without features of infection.  No clinical concern for cellulitis.  Diminished dorsalis pedis and posterior tibial pulses.  No current concern for acute ischemia. Skin: As noted above Neurologic: CN 2-12 grossly intact. Sensation intact, DTR normal.  Restricted movements in bilateral shoulders as noted above but distal upper extremity power is grade 5 x 5.  Right lower extremity power at least grade 4 x 5.  Left lower extremity power difficult to assess due to limitation from pain but at least 2 x 5.  Psychiatric: Normal judgment and insight. Alert and oriented x 3. Normal mood.     Labs on Admission: I have personally reviewed following labs and imaging studies  CBC: Recent Labs  Lab 11/25/19 1743  WBC 11.2*  NEUTROABS 9.3*  HGB 12.4*  HCT 37.1*  MCV 95.4  PLT 124*    Basic Metabolic Panel: Recent Labs  Lab 11/25/19 1743  NA 138  K 4.4  CL 102  CO2 28  GLUCOSE 103*  BUN 24*  CREATININE 0.46*  CALCIUM 8.1*    Liver Function Tests: No results for input(s): AST, ALT, ALKPHOS, BILITOT, PROT, ALBUMIN in the last 168 hours.  Urine analysis:    Component Value Date/Time   COLORURINE ORANGE (A) 11/25/2019 2231   APPEARANCEUR CLEAR 11/25/2019 2231   LABSPEC 1.025 11/25/2019 2231   PHURINE 6.5 11/25/2019 2231   GLUCOSEU NEGATIVE 11/25/2019 2231   GLUCOSEU NEGATIVE 08/11/2019 1529   HGBUR NEGATIVE 11/25/2019 2231   BILIRUBINUR SMALL (A) 11/25/2019 2231   BILIRUBINUR neg 04/03/2016 1549   KETONESUR 15 (A) 11/25/2019 2231   PROTEINUR NEGATIVE 11/25/2019 2231   UROBILINOGEN 4.0 (A) 08/11/2019 1529   NITRITE NEGATIVE 11/25/2019 2231   LEUKOCYTESUR NEGATIVE 11/25/2019 2231     Radiological Exams on Admission: DG Chest Portable 1 View  Result Date:  11/25/2019 CLINICAL DATA:  Shortness of breath. EXAM: PORTABLE  CHEST 1 VIEW COMPARISON:  10/29/2019 FINDINGS: There is new consolidation involving the right mid and right lower lung zones. There is no pneumothorax. There is a small right-sided pleural effusion. There is some atelectasis versus scarring at the lung bases bilaterally. Chronic changes are noted of the bilateral humeral heads. The heart size is stable. IMPRESSION: 1. New consolidation in the right mid and lower lung zones consistent with pneumonia. 2. Small right-sided pleural effusion. Electronically Signed   By: Constance Holster M.D.   On: 11/25/2019 17:29    EKG: Independently reviewed.  Sinus rhythm at 72 bpm, RBBB and LAFB.  QTc 537 ms but may not be accurately determined due to BBB morphology.    Assessment/Plan Principal Problem:   CAP (community acquired pneumonia) Active Problems:   Aortic stenosis, severe   GERD (gastroesophageal reflux disease)   Hyperlipidemia   Hypertension   Varicose veins of lower extremities with ulcer (HCC)   Chronic left hip pain   Acute on chronic respiratory failure with hypoxia (HCC)     Lobar pneumonia (right mid and lower lobe)/community-acquired pneumonia: Low index of suspicion for aspiration pneumonia at this time.  Continue empirically started IV ceftriaxone and azithromycin.  Incentive spirometry.  Supportive treatment with Mucinex and oxygen.  Acute on chronic respiratory failure with hypoxia: On nocturnal home oxygen 2 L/min.  Reportedly hypoxic now even in the daytime with PT.  Likely due to lobar pneumonia.  Treat as above.  Oxygen support.  Wean as tolerated.  Incentive spirometry.  Thrombocytopenia: Appears to be chronic and intermittent looking at labs from 2019.  Follow CBC.  No bleeding reported.  Severe aortic stenosis: Avoid hypotension.  Does have soft blood pressures which may be chronic but asymptomatic.  Schizoaffective disorder/bipolar disorder: After pharmacy  reviewed his home medications, I reinitiated several of these on day of admission 11/26/2019 (Abilify, Depakote and Geodon).  GERD: Consider PPI if taking at home after med rec completed.  Rheumatoid arthritis: Reviewed medications after pharmacy had reconciled on day of admission.  Patient may be on Humira at home, did not restart here, may continue as outpatient.  Also patient on chronic prednisone 5 mg daily.  Given stress situation and presentation with soft blood pressures, doubled the dose to 10 mg daily to avoid relative adrenal insufficiency.  Chronic left hip pain/nonambulatory state: Reportedly takes Percocet 5/325, continue.  Prolonged QTC: Unsure.  EKG 5/6 showed QTC of 537 ms but has RBBB and LAFB so unsure if this is accurate.  In any event avoid QT prolonging medications.  Will change azithromycin to doxycycline.  History of DVT: Is chronically on Eliquis at home, resumed.   DVT prophylaxis: Lovenox Code Status: Full, confirmed with patient Family Communication: None at bedside Disposition Plan:   Patient is from:  Home  Anticipated DC to:  Home  Anticipated DC date:  57 to 72 hours  Anticipated DC barriers: Improvement in pneumonia and hypoxia   Consults called: None Admission status: None  Severity of Illness: The appropriate patient status for this patient is INPATIENT. Inpatient status is judged to be reasonable and necessary in order to provide the required intensity of service to ensure the patient's safety. The patient's presenting symptoms, physical exam findings, and initial radiographic and laboratory data in the context of their chronic comorbidities is felt to place them at high risk for further clinical deterioration. Furthermore, it is not anticipated that the patient will be medically stable for discharge from the hospital within 2 midnights of  admission. The following factors support the patient status of inpatient.   " The patient's presenting symptoms  include productive cough, chills, dyspnea, hypoxia. " The worrisome physical exam findings include soft blood pressures, hypoxia, reduced breath sounds in the right lung base. " The initial radiographic and laboratory data are worrisome because of chest x-ray suggestive of right lower lobe and middle lobe pneumonia.  Mild thrombocytopenia, normal lactate. " The chronic co-morbidities include hyperlipidemia, hypertension, nonambulatory state.   * I certify that at the point of admission it is my clinical judgment that the patient will require inpatient hospital care spanning beyond 2 midnights from the point of admission due to high intensity of service, high risk for further deterioration and high frequency of surveillance required.Vernell Leep MD [facility]  To contact the attending provider between 7A-7P or the covering provider during after hours 7P-7A, please log into the web site [email or web address] and access using universal [place] password for that web site. If you do not have the password, please call the hospital operator.  11/26/2019, 1:42 PM

## 2019-11-26 NOTE — ED Notes (Signed)
[facility] notified (Phil) - patient ready for transport °

## 2019-11-26 NOTE — ED Notes (Signed)
Placed patient's upper dentures in pink denture cup and placed patient name label on cup and on bedside table.

## 2019-11-26 NOTE — ED Provider Notes (Signed)
Blood pressure (!) 92/56, pulse 62, temperature 98.7 F (37.1 C), temperature source Oral, resp. rate 20, height 5\' 1"  (1.549 m), weight 77.1 kg, SpO2 100 %.   In short, Shaun Yoder is a 74 y.o. male with a chief complaint of Shortness of Breath .  Refer to the original H&P for additional details.  08:00 AM  Patient is boarding in the emergency department awaiting admission for pneumonia.  He is on 2 L nasal cannula.  No events overnight.  Patient now on our 15 in the freestanding emergency department.  Called bed placement at both [facility] with both facilities being full and no immediate timeline for bed assignment. Discussed with Dr. Alvino Chapel at [facility] ED who will accept the patient in ED-ED transfer.   08:37 AM  Discussed patient's case with [facility], Dr. Algis Liming to request bed placement change to [facility]. Change in orders made. Plan for ED-ED transfer.   I reviewed all nursing notes, vitals, pertinent old records, EKGs, labs, imaging (as available).    Margette Fast, MD 11/26/19 [phone]

## 2019-11-26 NOTE — ED Notes (Signed)
Pt transferred to [facility] for admission.  Wife contacted to inform of inpatient bed assignment.

## 2019-11-26 NOTE — ED Notes (Signed)
[facility] notified (Cassie) - hospitalist @ [facility] to change bed assignment

## 2019-11-27 LAB — CBC
HCT: 33.8 % — ABNORMAL LOW (ref 39.0–52.0)
Hemoglobin: 10.8 g/dL — ABNORMAL LOW (ref 13.0–17.0)
MCH: 30.8 pg (ref 26.0–34.0)
MCHC: 32 g/dL (ref 30.0–36.0)
MCV: 96.3 fL (ref 80.0–100.0)
Platelets: 128 10*3/uL — ABNORMAL LOW (ref 150–400)
RBC: 3.51 MIL/uL — ABNORMAL LOW (ref 4.22–5.81)
RDW: 14.4 % (ref 11.5–15.5)
WBC: 5.6 10*3/uL (ref 4.0–10.5)
nRBC: 0 % (ref 0.0–0.2)

## 2019-11-27 LAB — BASIC METABOLIC PANEL
Anion gap: 9 (ref 5–15)
BUN: 17 mg/dL (ref 8–23)
CO2: 25 mmol/L (ref 22–32)
Calcium: 8.1 mg/dL — ABNORMAL LOW (ref 8.9–10.3)
Chloride: 102 mmol/L (ref 98–111)
Creatinine, Ser: 0.38 mg/dL — ABNORMAL LOW (ref 0.61–1.24)
GFR calc Af Amer: >60 mL/min (ref >60)
GFR calc non Af Amer: >60 mL/min (ref >60)
Glucose, Bld: 85 mg/dL (ref 70–99)
Potassium: 4.2 mmol/L (ref 3.5–5.1)
Sodium: 136 mmol/L (ref 135–145)

## 2019-11-27 LAB — URINE CULTURE: Culture: <10000 — AB

## 2019-11-27 MED ORDER — SODIUM CHLORIDE 0.9 % IV SOLN
1.5000 g | Freq: Four times a day (QID) | INTRAVENOUS | Status: DC
  Administered 2019-11-27 – 2019-12-01 (×15): 1.5 g via INTRAVENOUS
  Filled 2019-11-27 (×2): qty 1.5
  Filled 2019-11-27 (×3): qty 4
  Filled 2019-11-27: qty 1.5
  Filled 2019-11-27 (×2): qty 4
  Filled 2019-11-27: qty 1.5
  Filled 2019-11-27: qty 4
  Filled 2019-11-27: qty 1.5
  Filled 2019-11-27: qty 4
  Filled 2019-11-27: qty 1.5
  Filled 2019-11-27 (×3): qty 4
  Filled 2019-11-27: qty 1.5
  Filled 2019-11-27: qty 4
  Filled 2019-11-27: qty 1.5
  Filled 2019-11-27: qty 4

## 2019-11-27 MED ORDER — SODIUM CHLORIDE 0.9 % IV SOLN: 2.0000 g | INTRAVENOUS | Status: DC

## 2019-11-27 NOTE — Progress Notes (Signed)
Pt BP 87/58.  notified MD on call .  Will continue to monitor the patient

## 2019-11-27 NOTE — Progress Notes (Signed)
Pharmacy Antibiotic Note  Shaun Yoder is a 74 y.o. male admitted on 11/25/2019 with pneumonia.  Pharmacy has been consulted for Unasyn dosing.  Cephalosporin allergy of rash, but tolerated Cephs back in 2019 and several doses of penicillins.  Plan: Unasyn 1.5 gm IV q6hr Monitor renal function, clinical status and C&S  Height: 5\' 1"  (154.9 cm) Weight: 77.1 kg (170 lb) IBW/kg (Calculated) : 52.3  Temp (24hrs), Avg:98.3 F (36.8 C), Min:97.9 F (36.6 C), Max:98.5 F (36.9 C)  Recent Labs  Lab 11/25/19 1743 11/25/19 1756 11/27/19 0519  WBC 11.2*  --  5.6  CREATININE 0.46*  --  0.38*  LATICACIDVEN  --  1.3  --     Estimated Creatinine Clearance: 72.4 mL/min (A) (by C-G formula based on SCr of 0.38 mg/dL (L)).    Allergies  Allergen Reactions  . Leflunomide Diarrhea  . Morphine And Related Nausea And Vomiting  . Ancef [Cefazolin] Rash    Rash   . Plaquenil [Hydroxychloroquine Sulfate] Rash    Antimicrobials this admission: Unasyn 5/8 >>   Thank you for allowing pharmacy to be a part of this patient's care.  Alanda Slim, PharmD, [facility] Clinical Pharmacist Please see AMION for all Pharmacists' Contact Phone Numbers 11/27/2019, 2:13 PM

## 2019-11-27 NOTE — Plan of Care (Signed)
  Problem: Education: Goal: Knowledge of General Education information will improve Description Including pain rating scale, medication(s)/side effects and non-pharmacologic comfort measures Outcome: Progressing   

## 2019-11-27 NOTE — Evaluation (Signed)
Physical Therapy Evaluation Patient Details Name: Shaun Yoder MRN: [id number] DOB: 12-May-1946 Today's Date: 11/27/2019   History of Present Illness  NUH MEEDS is a 74 year old married male, nonambulatory at baseline, PMH: iron deficiency anemia, lower extremity chronic venous insufficiency/varicose veins, history of DVT, depression, GERD, HLD, HTN, schizoaffective disorder, sleep apnea but not on CPAP, chronic respiratory failure with hypoxia on nocturnal oxygen 2 L/min, rheumatoid arthritis, severe aortic stenosis, prior MSSA bacteremia and chronic left hip pain initially presented to [facility] ED on 11/25/2019 due to productive cough, dyspnea and oxygen saturations in the low 80s. Dx with CAP.  Clinical Impression  Pt admitted with above diagnosis. Pt lethargic on eval. Pt and wife report that care at home has been increasingly difficult and unsafe for them both. Wife has rheumatoid arthritis as well. Today pt required +2 tot A for bed mobility and scooting along EOB. At this point, pt at functional level indicative of the need for a higher level of care. Recommending SNF placement, pt and family agreeable.  Pt currently with functional limitations due to the deficits listed below (see PT Problem List). Pt will benefit from skilled PT to increase their independence and safety with mobility to allow discharge to the venue listed below.       Follow Up Recommendations SNF;Supervision/Assistance - 24 hour    Equipment Recommendations  None recommended by PT    Recommendations for Other Services       Precautions / Restrictions Precautions Precautions: Fall Restrictions Weight Bearing Restrictions: No      Mobility  Bed Mobility Overal bed mobility: Needs Assistance Bed Mobility: Supine to Sit;Sit to Supine     Supine to sit: Total assist;+2 for physical assistance Sit to supine: Total assist;+2 for physical assistance   General bed mobility comments: pt unable to move LE's  off EOB and has difficulty tolerated passive mvmt of LLE. Tot A for LE's as well as tot A at trunk to come to sitting position. Tot A +2 to return to supine and to position in bed  Transfers Overall transfer level: Needs assistance Equipment used: None Transfers: Lateral/Scoot Transfers          Lateral/Scoot Transfers: Total assist;+2 physical assistance General transfer comment: pt unable to stand but practiced scooting laterally along bedside, pt required +2 tot A, unable to push with RLE or assist with either UE  Ambulation/Gait             General Gait Details: unable at baseline  Stairs            Wheelchair Mobility    Modified Rankin (Stroke Patients Only)       Balance Overall balance assessment: Needs assistance Sitting-balance support: Single extremity supported;Feet unsupported Sitting balance-Leahy Scale: Fair Sitting balance - Comments: needed mod A with initial sitting, progressed to close supervision. Unable to perform dynamic activity Postural control: Right lateral lean                                   Pertinent Vitals/Pain Pain Assessment: Faces Faces Pain Scale: Hurts even more Pain Location: generalized, especially R shoulder and L hip Pain Descriptors / Indicators: Aching;Constant;Sore Pain Intervention(s): Limited activity within patient's tolerance;Monitored during session    [place] expects to be discharged to:: Private residence Living Arrangements: Spouse/significant other Available Help at Discharge: Family;Available 24 hours/day Type of Home: House Home Access: Ramped entrance  Home Layout: One level Home Equipment: Wheelchair - power;Adaptive equipment;Hospital bed;Walker - 2 wheels;Walker - 4 wheels;Grab bars - tub/shower;Grab bars - toilet Additional Comments: wife full time caregiver but additional help 3 days/wk 5 hrs. Waiting on hoyer lift from [place]    Prior Function Level of  Independence: Needs assistance   Gait / Transfers Assistance Needed: w/c dependent, wife helps with all transfers, pt has not fallen but they have had several near misses esp recently  ADL's / Homemaking Assistance Needed: uses urinal, wife helps with all ADL's, aide helps with bathing  Comments: has [employer]i that he can drive his w/c into     Hand Dominance   Dominant Hand: Right    Extremity/Trunk Assessment   Upper Extremity Assessment Upper Extremity Assessment: Defer to OT evaluation    Lower Extremity Assessment Lower Extremity Assessment: RLE deficits/detail;LLE deficits/detail;Generalized weakness RLE Deficits / Details: h/o TKA, knee ext 3-/5, trophic changes noted distal LE RLE Sensation: history of peripheral neuropathy;decreased proprioception;decreased light touch RLE Coordination: decreased gross motor;decreased fine motor LLE Deficits / Details: LLE very painful due to degeneration L hip, most pain in L groin. H/o TKA. Crepitus felt with mvmt at hip and knee. Knee ext <3/5, trophic changes noted distal LE LLE Sensation: decreased light touch;decreased proprioception;history of peripheral neuropathy LLE Coordination: decreased gross motor;decreased fine motor    Cervical / Trunk Assessment Cervical / Trunk Assessment: Other exceptions Cervical / Trunk Exceptions: severe scoliosis  Communication   Communication: Other (comment)(slurred speech)  Cognition Arousal/Alertness: Lethargic Behavior During Therapy: WFL for tasks assessed/performed Overall Cognitive Status: Impaired/Different from baseline Area of Impairment: Problem solving;Following commands;Safety/judgement;Attention;Awareness;Memory                   Current Attention Level: Selective Memory: Decreased short-term memory Following Commands: Follows multi-step commands inconsistently;Follows one step commands consistently   Awareness: Emergent Problem Solving: Difficulty sequencing;Requires verbal  cues;Requires tactile cues;Decreased initiation General Comments: pt very verbose with some tengential conversation      General Comments General comments (skin integrity, edema, etc.): VSS throughout. pt's wife has RA and has had increasing difficulty assisting pt at home    Exercises     Assessment/Plan    PT Assessment Patient needs continued PT services  PT Problem List Decreased strength;Decreased activity tolerance;Decreased range of motion;Decreased balance;Decreased mobility;Decreased coordination;Decreased cognition;Decreased knowledge of precautions;Pain;Impaired sensation;Obesity       PT Treatment Interventions DME instruction;Functional mobility training;Therapeutic activities;Therapeutic exercise;Balance training;Neuromuscular re-education;Cognitive remediation;Patient/family education    PT Goals (Current goals can be found in the Care Plan section)  Acute Rehab PT Goals Patient Stated Goal: pt would like to go home but agrees that home has been unsafe and difficult recently. Agreeable to SNF PT Goal Formulation: With patient/family Time For Goal Achievement: 12/11/19 Potential to Achieve Goals: Fair    Frequency Min 2X/week   Barriers to discharge Decreased caregiver support wife having difficulty providing for increaseding physical needs of pt    Co-evaluation PT/OT/SLP Co-Evaluation/Treatment: Yes Reason for Co-Treatment: Complexity of the patient's impairments (multi-system involvement);Necessary to address cognition/behavior during functional activity;For patient/therapist safety PT goals addressed during session: Mobility/safety with mobility;Balance         AM-PAC PT "6 Clicks" Mobility  Outcome Measure Help needed turning from your back to your side while in a flat bed without using bedrails?: A Lot Help needed moving from lying on your back to sitting on the side of a flat bed without using bedrails?: Total Help needed moving to and from  a bed to a  chair (including a wheelchair)?: Total Help needed standing up from a chair using your arms (e.g., wheelchair or bedside chair)?: Total Help needed to walk in hospital room?: Total Help needed climbing 3-5 steps with a railing? : Total 6 Click Score: 7    End of Session   Activity Tolerance: Patient limited by pain;Patient limited by fatigue;Patient limited by lethargy Patient left: in bed;with call bell/phone within reach;with bed alarm set;with family/visitor present Nurse Communication: Mobility status PT Visit Diagnosis: Muscle weakness (generalized) (M62.81);Pain;Adult, failure to thrive (R62.7) Pain - Right/Left: Left Pain - part of body: Hip    Time: [id number] PT Time Calculation (min) (ACUTE ONLY): 41 min   Charges:   PT Evaluation $PT Eval Moderate Complexity: 1 Mod PT Treatments $Therapeutic Activity: 8-22 mins        Leighton Roach, PT  Acute Rehab Services  Pager [phone] Office [place] 11/27/2019, 4:50 PM

## 2019-11-27 NOTE — Progress Notes (Signed)
PROGRESS NOTE                                                                                                                                                                                                             Patient Demographics:    Shaun Yoder, is a 74 y.o. male, DOB - 12-Jul-1946, [id number]  Admit date - 11/25/2019   Admitting Physician Modena Jansky, MD  Outpatient Primary MD for the patient is Colon Branch, MD  LOS - 2   Chief Complaint  Patient presents with  . Shortness of Breath       Brief Narrative     Shaun Yoder is a 74 year old married male, lives with his spouse and daughter, nonambulatory at baseline, has an Conservator, museum/gallery, hospital bed and supposed to get a Engineer, drilling lift soon via the [place], extensive PMH including iron deficiency anemia, lower extremity chronic venous insufficiency/varicose veins, history of DVT and uncertain if still on apixaban (awaiting home medications review by pharmacy), depression, GERD, HLD, HTN, schizoaffective disorder, sleep apnea but not on CPAP, chronic respiratory failure with hypoxia on nocturnal oxygen 2 L/min, rheumatoid arthritis, severe aortic stenosis, prior MSSA bacteremia and chronic left hip pain initially presented to [facility] ED on 11/25/2019 due to productive cough, dyspnea and oxygen saturations in the low 80s at home as per home health PT.  His cough has been ongoing for 2 days, brown color.  He states that he stays cold all the time.  Denies fever or chest pain.  Reports great appetite.  No dizziness, lightheadedness.  Denies choking or coughing with eating or drinking.  Denies sick contacts.  He reports that he completed 2 doses of the [employer] vaccine at the end of March.  ED Course: Afebrile, not tachypneic or tachycardic, soft but asymptomatic blood pressures (mostly in the 90s/50s), hypoxic to 88% on room air and has been on nasal cannula oxygen 2 L.  Lab work shows  unremarkable BMP, BNP one 256.6, troponin in the low 20s with a flat trend, lactate 1.3, WBC 11.2, platelets 124.  Flu panel and Covid PCR testing negative.  Urine microscopy unremarkable.  Blood and urine cultures drawn and pending.  Chest x-ray personally reviewed and has been reported as new consolidation in the right middle and lower lung zones consistent with pneumonia.  Small  right-sided pleural effusion.   Subjective:    Shaun Yoder he still reports some dyspnea, reports cough, as well report generalized weakness .   Assessment  & Plan :    Principal Problem:   CAP (community acquired pneumonia) Active Problems:   Aortic stenosis, severe   GERD (gastroesophageal reflux disease)   Hyperlipidemia   Hypertension   Varicose veins of lower extremities with ulcer (HCC)   Chronic left hip pain   Acute on chronic respiratory failure with hypoxia (HCC)  Lobar pneumonia (right mid and lower lobe)/community-acquired pneumonia:  -Patient chest x-ray significant for right middle and lower lobe pneumonia, patient reports coughing during his meals, some concern for aspiration pneumonia, will consult his LP, he is in the second and azithromycin, I will go ahead and change him to IV Unasyn for better coverage .   Acute on chronic respiratory failure with hypoxia:  - On nocturnal home oxygen 2 L/min.  Reportedly hypoxic now even in the daytime with PT.  Likely due to lobar pneumonia.  Treat as above.  Oxygen support.  Wean as tolerated.  Incentive spirometry.  Thrombocytopenia: -  Appears to be chronic and intermittent looking at labs from 2019.  Follow CBC.  No bleeding reported.  Severe aortic stenosis:  -Avoid hypotension.  Does have soft blood pressures which may be chronic but asymptomatic.  Schizoaffective disorder/bipolar disorder: Awaiting pharmacy to perform home med recs before resuming his home medications.  GERD: Consider PPI if taking at home after med rec  completed.  Rheumatoid arthritis: Unsure if he is on any medicines for this.  Chronic left hip pain/nonambulatory state: Reportedly takes Percocet 5/325, continue.  Prolonged QTC:  -He is on telemetry monitor, will continue with telemetry monitoring, will check magnesium level, avoid prolonging agents, will repeat EKG .  history of DVT  - on apixaban   COVID-19 Labs  No results for input(s): DDIMER, FERRITIN, LDH, CRP in the last 72 hours.  Lab Results  Component Value Date   SARSCOV2NAA NEGATIVE 11/25/2019   [place] NEGATIVE 10/29/2019   [place] Not Detected 07/02/2019     Code Status : Full  Family Communication  : none at bedside  Disposition Plan  :  Status is: Inpatient  Remains inpatient appropriate because:Ongoing diagnostic testing needed not appropriate for outpatient work up   Dispo: The patient is from: Home              Anticipated d/c is to: Home              Anticipated d/c date is: 2 days              Patient currently is not medically stable to d/c.       Consults  : none  Procedures  :   DVT Prophylaxis  :  Eliquis  Lab Results  Component Value Date   PLT 128 (L) 11/27/2019    Antibiotics  :   Anti-infectives (From admission, onward)   Start     Dose/Rate Route Frequency Ordered Stop   11/27/19 1800  cefTRIAXone (ROCEPHIN) 2 g in sodium chloride 0.9 % 100 mL IVPB     2 g 200 mL/hr over 30 Minutes Intravenous Every 24 hours 11/27/19 0855     11/26/19 1430  doxycycline (VIBRA-TABS) tablet 100 mg     100 mg Oral Every 12 hours 11/26/19 1407     11/25/19 1800  cefTRIAXone (ROCEPHIN) 1 g in sodium chloride 0.9 % 100 mL IVPB  Status:  Discontinued     1 g 200 mL/hr over 30 Minutes Intravenous Every 24 hours 11/25/19 1746 11/27/19 0855   11/25/19 1800  azithromycin (ZITHROMAX) 500 mg in sodium chloride 0.9 % 250 mL IVPB  Status:  Discontinued     500 mg 250 mL/hr over 60 Minutes Intravenous Every 24 hours 11/25/19 1746  11/26/19 1407   11/25/19 1745  levofloxacin (LEVAQUIN) IVPB 750 mg  Status:  Discontinued     750 mg 100 mL/hr over 90 Minutes Intravenous  Once 11/25/19 1737 11/25/19 1746        Objective:   Vitals:   11/26/19 2008 11/26/19 2026 11/27/19 0106 11/27/19 0400  BP: 94/64 (!) 109/58  99/68  Pulse: 69 72    Resp: (!) 25 18    Temp: 98.4 F (36.9 C) 98.5 F (36.9 C) 98.4 F (36.9 C) 97.9 F (36.6 C)  TempSrc: Oral Oral  Oral  SpO2: 96% 93%    Weight:      Height:        Wt Readings from Last 3 Encounters:  11/25/19 77.1 kg  11/02/19 77.6 kg  10/29/19 77.6 kg     Intake/Output Summary (Last 24 hours) at 11/27/2019 1240 Last data filed at 11/27/2019 0318 Gross per 24 hour  Intake 99.93 ml  Output 475 ml  Net -375.07 ml     Physical Exam  Awake Alert, and, but easily distracted, extremely frail.  Symmetrical Chest wall movement, diminished air entry at the bases, no wheezing RRR,No Gallops,Rubs or new Murmurs, No Parasternal Heave +ve B.Sounds, Abd Soft, No tenderness, No rebound - guarding or rigidity. No Cyanosis, but chronic lower extremity skin changes    Data Review:    CBC Recent Labs  Lab 11/25/19 1743 11/27/19 0519  WBC 11.2* 5.6  HGB 12.4* 10.8*  HCT 37.1* 33.8*  PLT 124* 128*  MCV 95.4 96.3  MCH 31.9 30.8  MCHC 33.4 32.0  RDW 14.8 14.4  LYMPHSABS 0.9  --   MONOABS 0.9  --   EOSABS 0.0  --   BASOSABS 0.0  --     Chemistries  Recent Labs  Lab 11/25/19 1743 11/27/19 0519  NA 138 136  K 4.4 4.2  CL 102 102  CO2 28 25  GLUCOSE 103* 85  BUN 24* 17  CREATININE 0.46* 0.38*  CALCIUM 8.1* 8.1*   ------------------------------------------------------------------------------------------------------------------ No results for input(s): CHOL, HDL, LDLCALC, TRIG, CHOLHDL, LDLDIRECT in the last 72 hours.  Lab Results  Component Value Date   HGBA1C 4.9 03/01/2018    ------------------------------------------------------------------------------------------------------------------ No results for input(s): TSH, T4TOTAL, T3FREE, THYROIDAB in the last 72 hours.  Invalid input(s): FREET3 ------------------------------------------------------------------------------------------------------------------ No results for input(s): VITAMINB12, FOLATE, FERRITIN, TIBC, IRON, RETICCTPCT in the last 72 hours.  Coagulation profile Recent Labs  Lab 11/25/19 1743  INR 1.4*    No results for input(s): DDIMER in the last 72 hours.  Cardiac Enzymes No results for input(s): CKMB, TROPONINI, MYOGLOBIN in the last 168 hours.  Invalid input(s): CK ------------------------------------------------------------------------------------------------------------------    Component Value Date/Time   BNP 1,256.6 (H) 11/25/2019 1743    Inpatient Medications  Scheduled Meds: . apixaban  2.5 mg Oral BID  . ARIPiprazole  30 mg Oral QHS  . vitamin C  500 mg Oral Q breakfast  . aspirin EC  81 mg Oral Q breakfast  . divalproex  1,000 mg Oral QHS  . divalproex  250 mg Oral QPC lunch  . doxycycline  100 mg Oral Q12H  .  guaiFENesin  600 mg Oral BID  . mirabegron ER  50 mg Oral Q breakfast  . multivitamin with minerals  1 tablet Oral Q breakfast  . oxybutynin  5 mg Oral TID WC  . pantoprazole  40 mg Oral Daily  . predniSONE  10 mg Oral Q breakfast  . senna  1 tablet Oral BID  . sodium chloride flush  3 mL Intravenous Q12H  . ziprasidone  20 mg Oral Q lunch  . ziprasidone  80 mg Oral QHS   Continuous Infusions: . sodium chloride    . cefTRIAXone (ROCEPHIN)  IV     PRN Meds:.sodium chloride, acetaminophen **OR** acetaminophen, albuterol, oxyCODONE-acetaminophen, polyethylene glycol, sodium chloride flush, triamcinolone cream  Micro Results Recent Results (from the past 240 hour(s))  Respiratory Panel by RT PCR (Flu A&B, Covid) - Nasopharyngeal Swab     Status: None    Collection Time: 11/25/19  5:43 PM   Specimen: Nasopharyngeal Swab  Result Value Ref Range Status   SARS Coronavirus 2 by RT PCR NEGATIVE NEGATIVE Final    Comment: (NOTE) SARS-CoV-2 target nucleic acids are NOT DETECTED. The SARS-CoV-2 RNA is generally detectable in upper respiratoy specimens during the acute phase of infection. The lowest concentration of SARS-CoV-2 viral copies this assay can detect is 131 copies/mL. A negative result does not preclude SARS-Cov-2 infection and should not be used as the sole basis for treatment or other patient management decisions. A negative result may occur with  improper specimen collection/handling, submission of specimen other than nasopharyngeal swab, presence of viral mutation(s) within the areas targeted by this assay, and inadequate number of viral copies (<131 copies/mL). A negative result must be combined with clinical observations, patient history, and epidemiological information. The expected result is Negative. Fact Sheet for Patients:  PinkCheek.be Fact Sheet for Healthcare Providers:  GravelBags.it This test is not yet ap proved or cleared by the [place] FDA and  has been authorized for detection and/or diagnosis of SARS-CoV-2 by FDA under an Emergency Use Authorization (EUA). This EUA will remain  in effect (meaning this test can be used) for the duration of the COVID-19 declaration under Section 564(b)(1) of the Act, 21 U.S.C. section 360bbb-3(b)(1), unless the authorization is terminated or revoked sooner.    Influenza A by PCR NEGATIVE NEGATIVE Final   Influenza B by PCR NEGATIVE NEGATIVE Final    Comment: (NOTE) The Xpert Xpress SARS-CoV-2/FLU/RSV assay is intended as an aid in  the diagnosis of influenza from Nasopharyngeal swab specimens and  should not be used as a sole basis for treatment. Nasal washings and  aspirates are unacceptable for Xpert Xpress  SARS-CoV-2/FLU/RSV  testing. Fact Sheet for Patients: PinkCheek.be Fact Sheet for Healthcare Providers: GravelBags.it This test is not yet approved or cleared by the [place] FDA and  has been authorized for detection and/or diagnosis of SARS-CoV-2 by  FDA under an Emergency Use Authorization (EUA). This EUA will remain  in effect (meaning this test can be used) for the duration of the  Covid-19 declaration under Section 564(b)(1) of the Act, 21  U.S.C. section 360bbb-3(b)(1), unless the authorization is  terminated or revoked. Performed at [facility], [address] [place], [place] [address]   Blood Culture (routine x 2)     Status: None (Preliminary result)   Collection Time: 11/25/19  5:45 PM   Specimen: BLOOD RIGHT FOREARM  Result Value Ref Range Status   Specimen Description   Final    BLOOD  RIGHT FOREARM Performed at [facility], [address] [place], [place] [address]    Special Requests   Final    BOTTLES DRAWN AEROBIC AND ANAEROBIC Blood Culture adequate volume Performed at [facility], [address] [place], [place] [address]    Culture   Final    NO GROWTH 2 DAYS Performed at [facility], [place] [address], [place], [place] [address]    Report Status PENDING  Incomplete  Blood Culture (routine x 2)     Status: None (Preliminary result)   Collection Time: 11/25/19  6:00 PM   Specimen: BLOOD  Result Value Ref Range Status   Specimen Description   Final    BLOOD LEFT ANTECUBITAL Performed at [facility], [address] [place], [place] [address]    Special Requests   Final    BOTTLES DRAWN AEROBIC AND ANAEROBIC Blood Culture adequate volume Performed at [facility], [address] [place], [place] [address]    Culture   Final    NO GROWTH 2 DAYS Performed at [facility], [place] [address] [place], [place] [address]     Report Status PENDING  Incomplete  Urine culture     Status: Abnormal   Collection Time: 11/25/19 10:31 PM   Specimen: In/Out Cath Urine  Result Value Ref Range Status   Specimen Description   Final    IN/OUT CATH URINE Performed at [facility], [address] [place], [place] [address]    Special Requests   Final    NONE Performed at [facility], [address] [place], [place] [address]    Culture (A)  Final    <10,000 COLONIES/mL INSIGNIFICANT GROWTH Performed at [facility], [place] [address], [place], [place] [address]    Report Status 11/27/2019 FINAL  Final    Radiology Reports DG Chest Portable 1 View  Result Date: 11/25/2019 CLINICAL DATA:  Shortness of breath. EXAM: PORTABLE CHEST 1 VIEW COMPARISON:  10/29/2019 FINDINGS: There is new consolidation involving the right mid and right lower lung zones. There is no pneumothorax. There is a small right-sided pleural effusion. There is some atelectasis versus scarring at the lung bases bilaterally. Chronic changes are noted of the bilateral humeral heads. The heart size is stable. IMPRESSION: 1. New consolidation in the right mid and lower lung zones consistent with pneumonia. 2. Small right-sided pleural effusion. Electronically Signed   By: Constance Holster M.D.   On: 11/25/2019 17:29   DG Chest Portable 1 View  Result Date: 10/29/2019 CLINICAL DATA:  Worsening cough, congestion EXAM: PORTABLE CHEST 1 VIEW COMPARISON:  02/28/2018 FINDINGS: Low lung volumes with bibasilar atelectasis. Mild cardiomegaly. No effusions. Severe chronic deformity of the humeral heads bilaterally with probable chronic right shoulder dislocation. No acute bony abnormality. IMPRESSION: Low lung volumes, bibasilar atelectasis.  Mild cardiomegaly. Electronically Signed   By: Rolm Baptise M.D.   On: 10/29/2019 18:43     Phillips Climes M.D on 11/27/2019 at 12:40 PM  Between 7am to 7pm - Pager - [phone]  After 7pm go to  [email or web address] - password [facility]  [facility] -  Office  [phone]

## 2019-11-27 NOTE — Evaluation (Signed)
Occupational Therapy Evaluation Patient Details Name: Shaun Yoder MRN: [id number] DOB: 04-18-1946 Today's Date: 11/27/2019    History of Present Illness Shaun Yoder is a 74 year old married male, nonambulatory at baseline, PMH: iron deficiency anemia, lower extremity chronic venous insufficiency/varicose veins, history of DVT, depression, GERD, HLD, HTN, schizoaffective disorder, sleep apnea but not on CPAP, chronic respiratory failure with hypoxia on nocturnal oxygen 2 L/min, rheumatoid arthritis, severe aortic stenosis, prior MSSA bacteremia and chronic left hip pain initially presented to [facility] ED on 11/25/2019 due to productive cough, dyspnea and oxygen saturations in the low 80s. Dx with CAP.   Clinical Impression   This 74 y/o male presents with the above. PTA pt living at home with spouse, was receiving assist for ADL tasks and functional transfers to/from power wheelchair. Pt with further decline as of recent, today requiring two person assist for completion of bed mobility and overall totalA for ADL tasks due to notable weakness and bil UEs deficits (weakness/limited ROM). Once positioned EOB pt able to tolerating sitting up approx 10 min, initially requiring modA progressed to close minguard assist. Pt present during session and reporting increased difficulty caring for pt at home (as she is primary caregiver). Pt will benefit from contineud acute OT services and currently recommend follow up therapy services in SNF setting after discharge to maximize his overall safety and independence with ADL and mobility and to decreased level of caregiver burden. Will follow.     Follow Up Recommendations  SNF;Supervision/Assistance - 24 hour    Equipment Recommendations  Other (comment)(TBD, hoyer)           Precautions / Restrictions Precautions Precautions: Fall Restrictions Weight Bearing Restrictions: No      Mobility Bed Mobility Overal bed mobility: Needs Assistance Bed  Mobility: Supine to Sit;Sit to Supine     Supine to sit: Total assist;+2 for physical assistance Sit to supine: Total assist;+2 for physical assistance   General bed mobility comments: pt unable to move LE's off EOB and has difficulty tolerated passive mvmt of LLE. Tot A for LE's as well as tot A at trunk to come to sitting position. Tot A +2 to return to supine and to position in bed  Transfers Overall transfer level: Needs assistance Equipment used: None Transfers: Lateral/Scoot Transfers          Lateral/Scoot Transfers: Total assist;+2 physical assistance General transfer comment: pt unable to stand but practiced scooting laterally along bedside, pt required +2 tot A, unable to push with RLE or assist with either UE    Balance Overall balance assessment: Needs assistance Sitting-balance support: Single extremity supported;Feet unsupported Sitting balance-Leahy Scale: Fair Sitting balance - Comments: needed mod A with initial sitting, progressed to close supervision. Unable to perform dynamic activity Postural control: Right lateral lean                                 ADL either performed or assessed with clinical judgement   ADL Overall ADL's : Needs assistance/impaired                                       General ADL Comments: currently totalA, pt unable to bring hand up to face for simple grooming task      Vision         Perception  Praxis      Pertinent Vitals/Pain Pain Assessment: Faces Faces Pain Scale: Hurts even more Pain Location: generalized, especially R shoulder and L hip Pain Descriptors / Indicators: Aching;Constant;Sore Pain Intervention(s): Limited activity within patient's tolerance;Monitored during session     Hand Dominance Right   Extremity/Trunk Assessment Upper Extremity Assessment Upper Extremity Assessment: RUE deficits/detail;LUE deficits/detail;Generalized weakness RUE Deficits / Details:  decreased AROM in shoulder, elbow and hand, not able to flex elbows beyond 60* (passively) RUE Coordination: decreased fine motor;decreased gross motor LUE Deficits / Details: decreased AROM in shoulder, elbow and hand, not able to flex elbows beyond 60* (passively) LUE Coordination: decreased fine motor;decreased gross motor   Lower Extremity Assessment Lower Extremity Assessment: Defer to PT evaluation RLE Deficits / Details: h/o TKA, knee ext 3-/5, trophic changes noted distal LE RLE Sensation: history of peripheral neuropathy;decreased proprioception;decreased light touch RLE Coordination: decreased gross motor;decreased fine motor LLE Deficits / Details: LLE very painful due to degeneration L hip, most pain in L groin. H/o TKA. Crepitus felt with mvmt at hip and knee. Knee ext <3/5, trophic changes noted distal LE LLE Sensation: decreased light touch;decreased proprioception;history of peripheral neuropathy LLE Coordination: decreased gross motor;decreased fine motor   Cervical / Trunk Assessment Cervical / Trunk Assessment: Other exceptions Cervical / Trunk Exceptions: severe scoliosis   Communication Communication Communication: Other (comment)(slurred speech)   Cognition Arousal/Alertness: Lethargic Behavior During Therapy: WFL for tasks assessed/performed Overall Cognitive Status: Impaired/Different from baseline Area of Impairment: Problem solving;Following commands;Safety/judgement;Attention;Awareness;Memory                   Current Attention Level: Selective Memory: Decreased short-term memory Following Commands: Follows multi-step commands inconsistently;Follows one step commands consistently   Awareness: Emergent Problem Solving: Difficulty sequencing;Requires verbal cues;Requires tactile cues;Decreased initiation General Comments: pt very verbose with some tengential conversation   General Comments  VSS throughout. pt's wife has RA and has had increasing  difficulty assisting pt at home    Exercises     Shoulder Instructions      Home Living Family/patient expects to be discharged to:: Private residence Living Arrangements: Spouse/significant other Available Help at Discharge: Family;Available 24 hours/day Type of Home: House Home Access: Ramped entrance     Home Layout: One level     Bathroom Shower/Tub: Tub/shower unit(safety tub)   Bathroom Toilet: Handicapped height Bathroom Accessibility: Yes   Home Equipment: Wheelchair - power;Adaptive equipment;Hospital bed;Walker - 2 wheels;Walker - 4 wheels;Grab bars - tub/shower;Grab bars - toilet Adaptive Equipment: Reacher Additional Comments: wife full time caregiver but additional help 3 days/wk 5 hrs. Waiting on hoyer lift from [place]      Prior Functioning/Environment Level of Independence: Needs assistance  Gait / Transfers Assistance Needed: w/c dependent, wife helps with all transfers, pt has not fallen but they have had several near misses esp recently ADL's / Homemaking Assistance Needed: uses urinal, wife helps with all ADL's, aide helps with bathing Communication / Swallowing Assistance Needed: feeds self most of time  Comments: has [employer] [place]i that he can drive his w/c into        OT Problem List: Decreased strength;Decreased range of motion;Decreased activity tolerance;Impaired balance (sitting and/or standing);Impaired UE functional use;Decreased knowledge of use of DME or AE      OT Treatment/Interventions: Self-care/ADL training;Therapeutic exercise;Energy conservation;DME and/or AE instruction;Therapeutic activities;Cognitive remediation/compensation;Patient/family education;Balance training    OT Goals(Current goals can be found in the care plan section) Acute Rehab OT Goals Patient Stated Goal: pt would like to go home  but agrees that home has been unsafe and difficult recently. Agreeable to SNF OT Goal Formulation: With patient Time For Goal Achievement:  12/11/19 Potential to Achieve Goals: Fair  OT Frequency: Min 2X/week   Barriers to D/C:            Co-evaluation PT/OT/SLP Co-Evaluation/Treatment: Yes Reason for Co-Treatment: Complexity of the patient's impairments (multi-system involvement);Necessary to address cognition/behavior during functional activity;To address functional/ADL transfers;For patient/therapist safety PT goals addressed during session: Mobility/safety with mobility;Balance OT goals addressed during session: ADL's and self-care      AM-PAC OT "6 Clicks" Daily Activity     Outcome Measure Help from another person eating meals?: Total Help from another person taking care of personal grooming?: Total Help from another person toileting, which includes using toliet, bedpan, or urinal?: Total Help from another person bathing (including washing, rinsing, drying)?: Total Help from another person to put on and taking off regular upper body clothing?: Total Help from another person to put on and taking off regular lower body clothing?: Total 6 Click Score: 6   End of Session Nurse Communication: Mobility status;Need for lift equipment  Activity Tolerance: Patient tolerated treatment well Patient left: in bed;with call bell/phone within reach;with family/visitor present  OT Visit Diagnosis: Other abnormalities of gait and mobility (R26.89);Muscle weakness (generalized) (M62.81)                Time: [id number] OT Time Calculation (min): 29 min Charges:  OT General Charges $OT Visit: 1 Visit OT Evaluation $OT Eval Moderate Complexity: [place], OT Acute Rehabilitation Services Pager [phone] Office [place] 11/27/2019, 5:43 PM

## 2019-11-28 LAB — MAGNESIUM: Magnesium: 1.6 mg/dL — ABNORMAL LOW (ref 1.7–2.4)

## 2019-11-28 LAB — BLOOD CULTURE ID PANEL (REFLEXED)

## 2019-11-28 LAB — CBC
HCT: 33.3 % — ABNORMAL LOW (ref 39.0–52.0)
Hemoglobin: 10.6 g/dL — ABNORMAL LOW (ref 13.0–17.0)
MCH: 30.5 pg (ref 26.0–34.0)
MCHC: 31.8 g/dL (ref 30.0–36.0)
MCV: 95.7 fL (ref 80.0–100.0)
Platelets: 129 10*3/uL — ABNORMAL LOW (ref 150–400)
RBC: 3.48 MIL/uL — ABNORMAL LOW (ref 4.22–5.81)
RDW: 14.4 % (ref 11.5–15.5)
WBC: 5.9 10*3/uL (ref 4.0–10.5)
nRBC: 0 % (ref 0.0–0.2)

## 2019-11-28 LAB — BASIC METABOLIC PANEL
Anion gap: 8 (ref 5–15)
BUN: 15 mg/dL (ref 8–23)
CO2: 27 mmol/L (ref 22–32)
Calcium: 8.3 mg/dL — ABNORMAL LOW (ref 8.9–10.3)
Chloride: 102 mmol/L (ref 98–111)
Creatinine, Ser: 0.51 mg/dL — ABNORMAL LOW (ref 0.61–1.24)
GFR calc Af Amer: >60 mL/min (ref >60)
GFR calc non Af Amer: >60 mL/min (ref >60)
Glucose, Bld: 91 mg/dL (ref 70–99)
Potassium: 4.1 mmol/L (ref 3.5–5.1)
Sodium: 137 mmol/L (ref 135–145)

## 2019-11-28 MED ORDER — MAGNESIUM SULFATE 2 GM/50ML IV SOLN
2.0000 g | Freq: Once | INTRAVENOUS | Status: AC
  Administered 2019-11-28: 2 g via INTRAVENOUS
  Filled 2019-11-28: qty 50

## 2019-11-28 MED ORDER — MAGNESIUM SULFATE 4 GM/100ML IV SOLN: 4.0000 g | Freq: Once | INTRAVENOUS | Status: DC

## 2019-11-28 MED ORDER — MAGNESIUM SULFATE IN D5W 1-5 GM/100ML-% IV SOLN
1.0000 g | Freq: Once | INTRAVENOUS | Status: AC
  Administered 2019-11-28: 1 g via INTRAVENOUS
  Filled 2019-11-28: qty 100

## 2019-11-28 NOTE — TOC Initial Note (Signed)
Transition of Care [facility]) - Initial/Assessment Note    Patient Details  Name: Shaun Yoder MRN: [id number] Date of Birth: [birth year]  Transition of Care [facility]) CM/SW Contact:    Trula Ore, [place] Phone Number: 11/28/2019, 1:01 PM  Clinical Narrative:                  CSW spoke with patient by phone. Patient wants CSW to call wife Shaun Yoder about SNF placement. CSW tried to call patients wife Shaun Yoder and left a voicemail message to call CSW back.  CSW waiting for call back from patients wife at ask about possible SNF placement.     Barriers to Discharge: Continued Medical Work up   Patient Goals and CMS Choice        Expected Discharge Plan and Services                                                Prior Living Arrangements/Services                       Activities of Daily Living      Permission Sought/Granted                  Emotional Assessment   Attitude/Demeanor/Rapport: Gracious Affect (typically observed): Calm Orientation: : Oriented to Self, Oriented to Place, Oriented to  Time, Oriented to Situation Alcohol / Substance Use: Not Applicable Psych Involvement: No (comment)  Admission diagnosis:  Hypoxia [R09.02] CAP (community acquired pneumonia) [J18.9] Community acquired pneumonia of right lung, unspecified part of lung [J18.9] Patient Active Problem List   Diagnosis Date Noted  . Acute on chronic respiratory failure with hypoxia ([place]) 11/26/2019  . CAP (community acquired pneumonia) 11/25/2019  . Chronic respiratory failure with hypoxia ([place]) 03/19/2018  . Bacteremia due to methicillin susceptible Staphylococcus aureus (MSSA) 12/26/2017  . Nocturnal hypoxemia 02/11/2017  . DJD (degenerative joint disease) 12/06/2016  . Chronic right shoulder pain 12/06/2016  . Contracture of joint of right shoulder region 12/06/2016  . Chronic left hip pain 09/12/2016  . Status post bilateral knee replacements 09/12/2016  . Gait  disorder 06/03/2016  . Left leg DVT ([place]) 04/04/2016  . Non-pressure chronic ulcer of right ankle with fat layer exposed ([place]) 10/25/2015  . Varicose veins of lower extremities with ulcer ([place]) 09/07/2015  . Annual physical exam 05/23/2015  . PCP NOTES >>>>>>>>>>>>>>>>> 03/28/2015  . Hypertension 03/28/2015  . Pulmonary nodules 12/09/2014  . Sleep apnea 11/14/2014  . GERD (gastroesophageal reflux disease) 11/14/2014  . Morbid obesity ([place]) 11/14/2014  . Aortic stenosis, severe   . Schizoaffective disorder, bipolar type ([place])   . Rheumatoid arthritis ([place])   . Hyperlipidemia   . Bifasicular block   . Mononeuritis of upper limb 05/20/2013  . Pain in soft tissues of limb 05/20/2013  . Idiopathic progressive polyneuropathy 05/20/2013  . 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 Depression 01/13/2013  . Memory loss 01/13/2013   PCP:  Colon Branch, MD Pharmacy:   [id number] - [place], [place] [address] Phone: [phone] Fax: [place], [place] - [address] [place] [phone] [place] [address] Phone: [phone] Fax: [phone]     Social Determinants of Health (SDOH) Interventions    Readmission Risk Interventions No  flowsheet data found.

## 2019-11-28 NOTE — Evaluation (Signed)
Clinical/Bedside Swallow Evaluation Patient Details  Name: Shaun Yoder MRN: [id number] Date of Birth: May 25, 1946  Today's Date: 11/28/2019 Time: SLP Start Time (ACUTE ONLY): 0910 SLP Stop Time (ACUTE ONLY): 0935 SLP Time Calculation (min) (ACUTE ONLY): 25 min  Past Medical History:  Past Medical History:  Diagnosis Date  . Anemia   . Cancer ([place])    bladder  . Chronic venous insufficiency   . Colitis   . Colon polyps   . Depression   . Diverticulosis   . DVT of deep femoral vein ([place])   . Gastritis   . GERD (gastroesophageal reflux disease)   . History of rectal polyps   . Hyperlipidemia   . Hypertension   . Iron deficiency   . Low back pain   . Lymphedema of both lower extremities   . OA (osteoarthritis)   . Osteonecrosis of shoulder region ([place])   . Schizoaffective disorder, bipolar type ([place])   . Sleep apnea   . Varicose vein    Past Surgical History:  Past Surgical History:  Procedure Laterality Date  . BLADDER SURGERY    . TOTAL KNEE ARTHROPLASTY Bilateral   . VARICOSE VEIN SURGERY     HPI:  Shaun Yoder is a 74 year old married male, lives with his spouse and daughter, nonambulatory at baseline, extensive PMH including iron deficiency anemia, lower extremity chronic venous insufficiency/varicose veins, history of DVT and uncertain if still on apixaban (awaiting home medications review by pharmacy), depression, GERD, HLD, HTN, schizoaffective disorder, sleep apnea but not on CPAP, chronic respiratory failure with hypoxia on nocturnal oxygen 2 L/min, rheumatoid arthritis, severe aortic stenosis, prior MSSA bacteremia and chronic left hip pain initially presented to [facility] ED on 11/25/2019 due to productive cough, dyspnea and oxygen saturations in the low 80s at home as per home health PT. Diagnosed with right mid and lower lobe PNA.  Reports coughing with pos. Barium swallow complete in 2016 indicated significant barium retention in the vallecula, moderate  esophageal dysmotility with suggested chronic reflux. No aspiration or penetration. [facility] no report available however this SLP did view study and patient did not appear to aspiration, mild residuals noted post swallow along base of tongue, vallecula, UES.    Assessment / Plan / Recommendation Clinical Impression  Patient presents with a suspected primary esophageal dysphagia characterized by complaints of globus, regurgitation, and coughing with pos, beginning primarily mid way-end of meal intake. Patient with known history of GERD and dysmotility. Also with known h/o a mild oropharyngeal dysphagia. Patient without overt indication of aspiration during exam at bedside today, swift oral transit of bolus, clear vocal quality throughout pos, however combination of oropharyngeal dysphagia, esophageal deficits, and acute PNA, raise concern for an aspiration related problem. Recommend MBS on 5/10 to further evaluation swallowing physiology. Suspect that patient will also benefit from esophageal w/u.  SLP Visit Diagnosis: Dysphagia, oropharyngeal phase (R13.12);Dysphagia, pharyngoesophageal phase (R13.14)    Aspiration Risk  (TBD)    Diet Recommendation Regular;Thin liquid   Liquid Administration via: Cup;Straw Medication Administration: Whole meds with liquid Supervision: Staff to assist with self feeding;Intermittent supervision to cue for compensatory strategies Compensations: Slow rate;Small sips/bites Postural Changes: Remain upright for at least 30 minutes after po intake;Seated upright at 90 degrees    Other  Recommendations Recommended Consults: Consider esophageal assessment Oral Care Recommendations: Oral care BID   Follow up Recommendations (TBD)        Swallow Study   General HPI:  Shaun Yoder is a 74 year old married male, lives with his spouse and daughter, nonambulatory at baseline, extensive PMH including iron deficiency anemia,  lower extremity chronic venous insufficiency/varicose veins, history of DVT and uncertain if still on apixaban (awaiting home medications review by pharmacy), depression, GERD, HLD, HTN, schizoaffective disorder, sleep apnea but not on CPAP, chronic respiratory failure with hypoxia on nocturnal oxygen 2 L/min, rheumatoid arthritis, severe aortic stenosis, prior MSSA bacteremia and chronic left hip pain initially presented to [facility] ED on 11/25/2019 due to productive cough, dyspnea and oxygen saturations in the low 80s at home as per home health PT. Diagnosed with right mid and lower lobe PNA.  Reports coughing with pos. Barium swallow complete in 2016 indicated significant barium retention in the vallecula, moderate esophageal dysmotility with suggested chronic reflux. No aspiration or penetration. [facility] no report available however this SLP did view study and patient did not appear to aspiration, mild residuals noted post swallow along base of tongue, vallecula, UES.  Type of Study: Bedside Swallow Evaluation Previous Swallow Assessment: see HPI Diet Prior to this Study: Regular;Thin liquids(renal) Temperature Spikes Noted: No Respiratory Status: Nasal cannula History of Recent Intubation: No Behavior/Cognition: Alert;Cooperative;Pleasant mood Oral Cavity Assessment: Within Functional Limits Oral Care Completed by SLP: Recent completion by staff Oral Cavity - Dentition: Dentures, top Vision: Functional for self-feeding Self-Feeding Abilities: Able to feed self;Needs assist;Needs set up Patient Positioning: Upright in bed Baseline Vocal Quality: Normal Volitional Cough: Strong Volitional Swallow: Able to elicit    Oral/Motor/Sensory Function Overall Oral Motor/Sensory Function: Within functional limits   Ice Chips Ice chips: Not tested   Thin Liquid Thin Liquid: Within functional limits Presentation: Cup;Straw    Nectar Thick Nectar Thick Liquid: Not  tested   Honey Thick Honey Thick Liquid: Not tested   Puree Puree: Within functional limits Presentation: Spoon   Solid     Solid: Within functional limits Presentation: [place] MA, CCC-SLP   Shaun Yoder 11/28/2019,9:41 AM

## 2019-11-28 NOTE — Progress Notes (Signed)
PROGRESS NOTE                                                                                                                                                                                                             Patient Demographics:    Shaun Yoder, is a 74 y.o. male, DOB - 1946/02/17, [id number]  Admit date - 11/25/2019   Admitting Physician Modena Jansky, MD  Outpatient Primary MD for the patient is Colon Branch, MD  LOS - 3   Chief Complaint  Patient presents with  . Shortness of Breath       Brief Narrative     Shaun Yoder is a 74 year old married male, lives with his spouse and daughter, nonambulatory at baseline, has an Conservator, museum/gallery, hospital bed and supposed to get a Engineer, drilling lift soon via the [place], extensive PMH including iron deficiency anemia, lower extremity chronic venous insufficiency/varicose veins, history of DVT and uncertain if still on apixaban (awaiting home medications review by pharmacy), depression, GERD, HLD, HTN, schizoaffective disorder, sleep apnea but not on CPAP, chronic respiratory failure with hypoxia on nocturnal oxygen 2 L/min, rheumatoid arthritis, severe aortic stenosis, prior MSSA bacteremia and chronic left hip pain initially presented to [facility] ED on 11/25/2019 due to productive cough, dyspnea and oxygen saturations in the low 80s at home as per home health PT.  His cough has been ongoing for 2 days, brown color.  He states that he stays cold all the time.  Denies fever or chest pain.  Reports great appetite.  No dizziness, lightheadedness.  Denies choking or coughing with eating or drinking.  Denies sick contacts.  He reports that he completed 2 doses of the [employer] vaccine at the end of March.  ED Course: Afebrile, not tachypneic or tachycardic, soft but asymptomatic blood pressures (mostly in the 90s/50s), hypoxic to 88% on room air and has been on nasal cannula oxygen 2 L.  Lab work shows  unremarkable BMP, BNP one 256.6, troponin in the low 20s with a flat trend, lactate 1.3, WBC 11.2, platelets 124.  Flu panel and Covid PCR testing negative.  Urine microscopy unremarkable.  Blood and urine cultures drawn and pending.  Chest x-ray personally reviewed and has been reported as new consolidation in the right middle and lower lung zones consistent with pneumonia.  Small  right-sided pleural effusion.   Subjective:    Shaun Yoder he still reports some dyspnea, reports some cough at the end of his meals, denies any fever or chills .   Assessment  & Plan :    Principal Problem:   CAP (community acquired pneumonia) Active Problems:   Aortic stenosis, severe   GERD (gastroesophageal reflux disease)   Hyperlipidemia   Hypertension   Varicose veins of lower extremities with ulcer (HCC)   Chronic left hip pain   Acute on chronic respiratory failure with hypoxia (HCC)  Lobar pneumonia (right mid and lower lobe)/community-acquired pneumonia:  -Patient chest x-ray significant for right middle and lower lobe pneumonia. - patient reports coughing during his meals, SLP is consulted, plan for MBS tomorrow. - some concern for aspiration pneumonia, changed him to IV Unasyn for better coverage .  Acute on chronic respiratory failure with hypoxia:  - On nocturnal home oxygen 2 L/min.  Reportedly hypoxic now even in the daytime with PT.  Likely due to lobar pneumonia.  Treat as above.  Oxygen support.  Wean as tolerated.  Incentive spirometry.  Thrombocytopenia: -  Appears to be chronic and intermittent looking at labs from 2019.  Follow CBC.  No bleeding reported.  Severe aortic stenosis:  -Avoid hypotension.  Does have soft blood pressures which may be chronic but asymptomatic.  Schizoaffective disorder/bipolar disorder: Awaiting pharmacy to perform home med recs before resuming his home medications.  GERD: Consider PPI if taking at home after med rec completed.  Rheumatoid  arthritis: Unsure if he is on any medicines for this.  Chronic left hip pain/nonambulatory state: Reportedly takes Percocet 5/325, continue.  Prolonged QTC:  -Continue with telemetry monitoring, QTC remains prolonged at, is hypomagnesemia was corrected, will repeat EKG, meanwhile we will hold prolonging agents mainly Geodon . -We will repeat EKG tomorrow  history of DVT  - on apixaban   Schizoaffective disorder/bipolar disorder:  -Abilify and Depakote was resumed, will hold Geodon in the setting of prolonged QTC .  GERD: Consider PPI if taking at home after med rec completed.  Rheumatoid arthritis: -   patient on chronic prednisone 5 mg daily. Increase to 10 mg oral daily in the hospital stay due to acute illness. COVID-19 Labs  No results for input(s): DDIMER, FERRITIN, LDH, CRP in the last 72 hours.  Lab Results  Component Value Date   SARSCOV2NAA NEGATIVE 11/25/2019   [place] NEGATIVE 10/29/2019   [place] Not Detected 07/02/2019     Code Status : Full  Family Communication  : none at bedside  Disposition Plan  :  Status is: Inpatient  Remains inpatient appropriate because:Ongoing diagnostic testing needed not appropriate for outpatient work up   Dispo: The patient is from: Home              Anticipated d/c is to: Home              Anticipated d/c date is: 2 days              Patient currently is not medically stable to d/c.       Consults  : none  Procedures  :   DVT Prophylaxis  :  Eliquis  Lab Results  Component Value Date   PLT 129 (L) 11/28/2019    Antibiotics  :   Anti-infectives (From admission, onward)   Start     Dose/Rate Route Frequency Ordered Stop   11/27/19 1800  cefTRIAXone (ROCEPHIN) 2 g in sodium chloride 0.9 %  100 mL IVPB  Status:  Discontinued     2 g 200 mL/hr over 30 Minutes Intravenous Every 24 hours 11/27/19 0855 11/27/19 1250   11/27/19 1415  ampicillin-sulbactam (UNASYN) 1.5 g in sodium chloride 0.9 % 100 mL  IVPB     1.5 g 200 mL/hr over 30 Minutes Intravenous Every 6 hours 11/27/19 1409     11/26/19 1430  doxycycline (VIBRA-TABS) tablet 100 mg  Status:  Discontinued     100 mg Oral Every 12 hours 11/26/19 1407 11/27/19 1250   11/25/19 1800  cefTRIAXone (ROCEPHIN) 1 g in sodium chloride 0.9 % 100 mL IVPB  Status:  Discontinued     1 g 200 mL/hr over 30 Minutes Intravenous Every 24 hours 11/25/19 1746 11/27/19 0855   11/25/19 1800  azithromycin (ZITHROMAX) 500 mg in sodium chloride 0.9 % 250 mL IVPB  Status:  Discontinued     500 mg 250 mL/hr over 60 Minutes Intravenous Every 24 hours 11/25/19 1746 11/26/19 1407   11/25/19 1745  levofloxacin (LEVAQUIN) IVPB 750 mg  Status:  Discontinued     750 mg 100 mL/hr over 90 Minutes Intravenous  Once 11/25/19 1737 11/25/19 1746        Objective:   Vitals:   11/27/19 0400 11/27/19 1400 11/27/19 2109 11/28/19 0517  BP: 99/68 96/60 (!) 87/58 96/60  Pulse:   64 (!) 53  Resp:  19 20 17   Temp: 97.9 F (36.6 C) 98.1 F (36.7 C) 97.6 F (36.4 C) 98 F (36.7 C)  TempSrc: Oral Oral Axillary Oral  SpO2:  92% 92% 96%  Weight:    78.2 kg  Height:        Wt Readings from Last 3 Encounters:  11/28/19 78.2 kg  11/02/19 77.6 kg  10/29/19 77.6 kg     Intake/Output Summary (Last 24 hours) at 11/28/2019 1508 Last data filed at 11/28/2019 [id number] Gross per 24 hour  Intake 300 ml  Output 1200 ml  Net -900 ml     Physical Exam  Awake Alert, Oriented X 3,  frail Symmetrical Chest wall movement, Good air movement bilaterally, CTAB RRR,No Gallops,Rubs or new Murmurs, No Parasternal Heave +ve B.Sounds, Abd Soft, No tenderness, No rebound - guarding or rigidity. No Cyanosis, no neck lower extremity skin changes     Data Review:    CBC Recent Labs  Lab 11/25/19 1743 11/27/19 0519 11/28/19 0538  WBC 11.2* 5.6 5.9  HGB 12.4* 10.8* 10.6*  HCT 37.1* 33.8* 33.3*  PLT 124* 128* 129*  MCV 95.4 96.3 95.7  MCH 31.9 30.8 30.5  MCHC 33.4 32.0 31.8    RDW 14.8 14.4 14.4  LYMPHSABS 0.9  --   --   MONOABS 0.9  --   --   EOSABS 0.0  --   --   BASOSABS 0.0  --   --     Chemistries  Recent Labs  Lab 11/25/19 1743 11/27/19 0519 11/28/19 0538  NA 138 136 137  K 4.4 4.2 4.1  CL 102 102 102  CO2 28 25 27   GLUCOSE 103* 85 91  BUN 24* 17 15  CREATININE 0.46* 0.38* 0.51*  CALCIUM 8.1* 8.1* 8.3*  MG  --   --  1.6*   ------------------------------------------------------------------------------------------------------------------ No results for input(s): CHOL, HDL, LDLCALC, TRIG, CHOLHDL, LDLDIRECT in the last 72 hours.  Lab Results  Component Value Date   HGBA1C 4.9 03/01/2018   ------------------------------------------------------------------------------------------------------------------ No results for input(s): TSH, T4TOTAL, T3FREE, THYROIDAB in the last 72  hours.  Invalid input(s): FREET3 ------------------------------------------------------------------------------------------------------------------ No results for input(s): VITAMINB12, FOLATE, FERRITIN, TIBC, IRON, RETICCTPCT in the last 72 hours.  Coagulation profile Recent Labs  Lab 11/25/19 1743  INR 1.4*    No results for input(s): DDIMER in the last 72 hours.  Cardiac Enzymes No results for input(s): CKMB, TROPONINI, MYOGLOBIN in the last 168 hours.  Invalid input(s): CK ------------------------------------------------------------------------------------------------------------------    Component Value Date/Time   BNP 1,256.6 (H) 11/25/2019 1743    Inpatient Medications  Scheduled Meds: . apixaban  2.5 mg Oral BID  . ARIPiprazole  30 mg Oral QHS  . vitamin C  500 mg Oral Q breakfast  . aspirin EC  81 mg Oral Q breakfast  . divalproex  1,000 mg Oral QHS  . divalproex  250 mg Oral QPC lunch  . guaiFENesin  600 mg Oral BID  . mirabegron ER  50 mg Oral Q breakfast  . multivitamin with minerals  1 tablet Oral Q breakfast  . oxybutynin  5 mg Oral TID  WC  . pantoprazole  40 mg Oral Daily  . predniSONE  10 mg Oral Q breakfast  . senna  1 tablet Oral BID  . sodium chloride flush  3 mL Intravenous Q12H   Continuous Infusions: . sodium chloride    . ampicillin-sulbactam (UNASYN) IV 1.5 g (11/28/19 0900)   PRN Meds:.sodium chloride, acetaminophen **OR** acetaminophen, albuterol, oxyCODONE-acetaminophen, polyethylene glycol, sodium chloride flush, triamcinolone cream  Micro Results Recent Results (from the past 240 hour(s))  Respiratory Panel by RT PCR (Flu A&B, Covid) - Nasopharyngeal Swab     Status: None   Collection Time: 11/25/19  5:43 PM   Specimen: Nasopharyngeal Swab  Result Value Ref Range Status   SARS Coronavirus 2 by RT PCR NEGATIVE NEGATIVE Final    Comment: (NOTE) SARS-CoV-2 target nucleic acids are NOT DETECTED. The SARS-CoV-2 RNA is generally detectable in upper respiratoy specimens during the acute phase of infection. The lowest concentration of SARS-CoV-2 viral copies this assay can detect is 131 copies/mL. A negative result does not preclude SARS-Cov-2 infection and should not be used as the sole basis for treatment or other patient management decisions. A negative result may occur with  improper specimen collection/handling, submission of specimen other than nasopharyngeal swab, presence of viral mutation(s) within the areas targeted by this assay, and inadequate number of viral copies (<131 copies/mL). A negative result must be combined with clinical observations, patient history, and epidemiological information. The expected result is Negative. Fact Sheet for Patients:  PinkCheek.be Fact Sheet for Healthcare Providers:  GravelBags.it This test is not yet ap proved or cleared by the [place] FDA and  has been authorized for detection and/or diagnosis of SARS-CoV-2 by FDA under an Emergency Use Authorization (EUA). This EUA will remain  in effect  (meaning this test can be used) for the duration of the COVID-19 declaration under Section 564(b)(1) of the Act, 21 U.S.C. section 360bbb-3(b)(1), unless the authorization is terminated or revoked sooner.    Influenza A by PCR NEGATIVE NEGATIVE Final   Influenza B by PCR NEGATIVE NEGATIVE Final    Comment: (NOTE) The Xpert Xpress SARS-CoV-2/FLU/RSV assay is intended as an aid in  the diagnosis of influenza from Nasopharyngeal swab specimens and  should not be used as a sole basis for treatment. Nasal washings and  aspirates are unacceptable for Xpert Xpress SARS-CoV-2/FLU/RSV  testing. Fact Sheet for Patients: PinkCheek.be Fact Sheet for Healthcare Providers: GravelBags.it This test is not yet approved or cleared  by the [place] and  has been authorized for detection and/or diagnosis of SARS-CoV-2 by  FDA under an Emergency Use Authorization (EUA). This EUA will remain  in effect (meaning this test can be used) for the duration of the  Covid-19 declaration under Section 564(b)(1) of the Act, 21  U.S.C. section 360bbb-3(b)(1), unless the authorization is  terminated or revoked. Performed at [facility], [address] [place], [place] [address]   Blood Culture (routine x 2)     Status: None (Preliminary result)   Collection Time: 11/25/19  5:45 PM   Specimen: BLOOD RIGHT FOREARM  Result Value Ref Range Status   Specimen Description   Final    BLOOD RIGHT FOREARM Performed at [facility], [address] [place], [place] [address]    Special Requests   Final    BOTTLES DRAWN AEROBIC AND ANAEROBIC Blood Culture adequate volume Performed at [facility], [address] [place], [place] [address]    Culture   Final    NO GROWTH 3 DAYS Performed at [facility], [place] [address], [place], [place] [address]    Report Status PENDING  Incomplete  Blood Culture (routine x 2)      Status: None (Preliminary result)   Collection Time: 11/25/19  6:00 PM   Specimen: BLOOD  Result Value Ref Range Status   Specimen Description   Final    BLOOD LEFT ANTECUBITAL Performed at [facility], [address] [place], [place] [address]    Special Requests   Final    BOTTLES DRAWN AEROBIC AND ANAEROBIC Blood Culture adequate volume Performed at [facility], [address] [place], [place] [address]    Culture  Setup Time   Final    GRAM POSITIVE COCCI IN CLUSTERS ANAEROBIC BOTTLE ONLY Organism ID to follow Performed at [facility], [place] [address], [place], [place] [address]    Culture GRAM POSITIVE COCCI  Final   Report Status PENDING  Incomplete  Urine culture     Status: Abnormal   Collection Time: 11/25/19 10:31 PM   Specimen: In/Out Cath Urine  Result Value Ref Range Status   Specimen Description   Final    IN/OUT CATH URINE Performed at [facility], [address] [place], [place] [address]    Special Requests   Final    NONE Performed at [facility], [address] [place], [place] [address]    Culture (A)  Final    <10,000 COLONIES/mL INSIGNIFICANT GROWTH Performed at [facility], [place] [address], [place],  [address]    Report Status 11/27/2019 FINAL  Final    Radiology Reports DG Chest Portable 1 View  Result Date: 11/25/2019 CLINICAL DATA:  Shortness of breath. EXAM: PORTABLE CHEST 1 VIEW COMPARISON:  10/29/2019 FINDINGS: There is new consolidation involving the right mid and right lower lung zones. There is no pneumothorax. There is a small right-sided pleural effusion. There is some atelectasis versus scarring at the lung bases bilaterally. Chronic changes are noted of the bilateral humeral heads. The heart size is stable. IMPRESSION: 1. New consolidation in the right mid and lower lung zones consistent with pneumonia. 2. Small right-sided pleural effusion. Electronically Signed   By:  Constance Holster M.D.   On: 11/25/2019 17:29   DG Chest Portable 1 View  Result Date: 10/29/2019 CLINICAL  DATA:  Worsening cough, congestion EXAM: PORTABLE CHEST 1 VIEW COMPARISON:  02/28/2018 FINDINGS: Low lung volumes with bibasilar atelectasis. Mild cardiomegaly. No effusions. Severe chronic deformity of the humeral heads bilaterally with probable chronic right shoulder dislocation. No acute bony abnormality. IMPRESSION: Low lung volumes, bibasilar atelectasis.  Mild cardiomegaly. Electronically Signed   By: Rolm Baptise M.D.   On: 10/29/2019 18:43     Phillips Climes M.D on 11/28/2019 at 3:08 PM  Between 7am to 7pm - Pager - [phone]  After 7pm go to [email or web address] - password [email or web address]  [facility] -  Office  [phone]

## 2019-11-28 NOTE — Progress Notes (Signed)
PHARMACY - PHYSICIAN COMMUNICATION CRITICAL VALUE ALERT - BLOOD CULTURE IDENTIFICATION (BCID)  Shaun Yoder is an 74 y.o. male who presented to [facility] on 11/25/2019 with a chief complaint of PNA.  Name of physician (or Provider) Contacted: Elgergawy  Current antibiotics: Unasyn  Changes to prescribed antibiotics recommended:  Patient is on recommended antibiotics - No changes needed >> likely contaminant (BCID negative, likely CoNS species).  Results for orders placed or performed during the hospital encounter of 11/25/19  Blood Culture ID Panel (Reflexed) (Collected: 11/25/2019  6:00 PM)  Result Value Ref Range   Enterococcus species NOT DETECTED NOT DETECTED   Listeria monocytogenes NOT DETECTED NOT DETECTED   Staphylococcus species NOT DETECTED NOT DETECTED   Staphylococcus aureus (BCID) NOT DETECTED NOT DETECTED   Streptococcus species NOT DETECTED NOT DETECTED   Streptococcus agalactiae NOT DETECTED NOT DETECTED   Streptococcus pneumoniae NOT DETECTED NOT DETECTED   Streptococcus pyogenes NOT DETECTED NOT DETECTED   Acinetobacter baumannii NOT DETECTED NOT DETECTED   Enterobacteriaceae species NOT DETECTED NOT DETECTED   Enterobacter cloacae complex NOT DETECTED NOT DETECTED   Escherichia coli NOT DETECTED NOT DETECTED   Klebsiella oxytoca NOT DETECTED NOT DETECTED   Klebsiella pneumoniae NOT DETECTED NOT DETECTED   Proteus species NOT DETECTED NOT DETECTED   Serratia marcescens NOT DETECTED NOT DETECTED   Haemophilus influenzae NOT DETECTED NOT DETECTED   Neisseria meningitidis NOT DETECTED NOT DETECTED   Pseudomonas aeruginosa NOT DETECTED NOT DETECTED   Candida albicans NOT DETECTED NOT DETECTED   Candida glabrata NOT DETECTED NOT DETECTED   Candida krusei NOT DETECTED NOT DETECTED   Candida parapsilosis NOT DETECTED NOT DETECTED   Candida tropicalis NOT DETECTED NOT DETECTED    Arrie Senate, PharmD, BCPS Clinical Pharmacist [phone] Please check AMION  for all [place] numbers 11/28/2019

## 2019-11-28 NOTE — TOC Progression Note (Signed)
Transition of Care [facility]) - Progression Note    Patient Details  Name: Shaun Yoder MRN: [id number] Date of Birth: 12/27/1945  Transition [facility]) CM/SW [place], [place] Phone Number: 11/28/2019, 4:04 PM  Clinical Narrative:     CSW spoke with patients wife is agreeable to SNF placement. FL2 and assessment completed. PASSR number under review. CSW faxed out initial referral for SNF placement near area where patient lives.  Bed offers Pending, PASSR number under review.      Barriers to Discharge: Continued Medical Work up  Expected Discharge Plan and Services                                                 Social Determinants of Health (SDOH) Interventions    Readmission Risk Interventions No flowsheet data found.

## 2019-11-29 ENCOUNTER — Telehealth: Payer: Self-pay | Admitting: Internal Medicine

## 2019-11-29 ENCOUNTER — Telehealth: Payer: Self-pay

## 2019-11-29 ENCOUNTER — Inpatient Hospital Stay (HOSPITAL_COMMUNITY): Payer: Medicare Other

## 2019-11-29 DIAGNOSIS — J189 Pneumonia, unspecified organism: Secondary | ICD-10-CM

## 2019-11-29 DIAGNOSIS — Z0279 Encounter for issue of other medical certificate: Secondary | ICD-10-CM

## 2019-11-29 DIAGNOSIS — J811 Chronic pulmonary edema: Secondary | ICD-10-CM

## 2019-11-29 DIAGNOSIS — I5033 Acute on chronic diastolic (congestive) heart failure: Secondary | ICD-10-CM

## 2019-11-29 LAB — CBC
HCT: 34.8 % — ABNORMAL LOW (ref 39.0–52.0)
Hemoglobin: 11.3 g/dL — ABNORMAL LOW (ref 13.0–17.0)
MCH: 30.5 pg (ref 26.0–34.0)
MCHC: 32.5 g/dL (ref 30.0–36.0)
MCV: 94.1 fL (ref 80.0–100.0)
Platelets: 153 10*3/uL (ref 150–400)
RBC: 3.7 MIL/uL — ABNORMAL LOW (ref 4.22–5.81)
RDW: 14 % (ref 11.5–15.5)
WBC: 5.5 10*3/uL (ref 4.0–10.5)
nRBC: 0 % (ref 0.0–0.2)

## 2019-11-29 LAB — BASIC METABOLIC PANEL
Anion gap: 6 (ref 5–15)
BUN: 13 mg/dL (ref 8–23)
CO2: 27 mmol/L (ref 22–32)
Calcium: 8 mg/dL — ABNORMAL LOW (ref 8.9–10.3)
Chloride: 101 mmol/L (ref 98–111)
Creatinine, Ser: 0.55 mg/dL — ABNORMAL LOW (ref 0.61–1.24)
GFR calc Af Amer: >60 mL/min (ref >60)
GFR calc non Af Amer: >60 mL/min (ref >60)
Glucose, Bld: 100 mg/dL — ABNORMAL HIGH (ref 70–99)
Potassium: 4.1 mmol/L (ref 3.5–5.1)
Sodium: 134 mmol/L — ABNORMAL LOW (ref 135–145)

## 2019-11-29 LAB — MAGNESIUM: Magnesium: 1.9 mg/dL (ref 1.7–2.4)

## 2019-11-29 LAB — SARS CORONAVIRUS 2 (TAT 6-24 HRS): SARS Coronavirus 2: NEGATIVE

## 2019-11-29 MED ORDER — MAGNESIUM SULFATE IN D5W 1-5 GM/100ML-% IV SOLN
1.0000 g | Freq: Once | INTRAVENOUS | Status: AC
  Administered 2019-11-29: 1 g via INTRAVENOUS
  Filled 2019-11-29: qty 100

## 2019-11-29 MED ORDER — WHITE PETROLATUM EX OINT
TOPICAL_OINTMENT | CUTANEOUS | Status: AC
  Filled 2019-11-29: qty 28.35

## 2019-11-29 MED ORDER — LURASIDONE HCL 40 MG PO TABS
40.0000 mg | ORAL_TABLET | Freq: Every day | ORAL | Status: DC
  Administered 2019-11-29 – 2019-11-30 (×2): 40 mg via ORAL
  Filled 2019-11-29 (×2): qty 1

## 2019-11-29 NOTE — Telephone Encounter (Signed)
Immunization record updated.

## 2019-11-29 NOTE — NC FL2 (Signed)
[place] LEVEL OF CARE SCREENING TOOL     IDENTIFICATION  Patient Name: Shaun Yoder Birthdate: 1946/03/20 Sex: male Admission Date (Current Location): 11/25/2019  [facility] and [place] Number:  Herbalist and Address:  [facility] [place], [place] [address], [place], [place] [address]      Provider Number: [id number]  Attending Physician Name and Address:  Albertine Patricia, MD  Relative Name and Phone Number:  Janae Bridgeman [phone]    Current Level of Care: Hospital Recommended Level of Care: [place] Prior Approval Number:    Date Approved/Denied:   PASRR Number: pending  Discharge Plan: SNF    Current Diagnoses: Patient Active Problem List   Diagnosis Date Noted  . Acute on chronic respiratory failure with hypoxia ([place]) 11/26/2019  . CAP (community acquired pneumonia) 11/25/2019  . Chronic respiratory failure with hypoxia ([place]) 03/19/2018  . Bacteremia due to methicillin susceptible Staphylococcus aureus (MSSA) 12/26/2017  . Nocturnal hypoxemia 02/11/2017  . DJD (degenerative joint disease) 12/06/2016  . Chronic right shoulder pain 12/06/2016  . Contracture of joint of right shoulder region 12/06/2016  . Chronic left hip pain 09/12/2016  . Status post bilateral knee replacements 09/12/2016  . Gait disorder 06/03/2016  . Left leg DVT ([place]) 04/04/2016  . Non-pressure chronic ulcer of right ankle with fat layer exposed ([place]) 10/25/2015  . Varicose veins of lower extremities with ulcer ([place]) 09/07/2015  . Annual physical exam 05/23/2015  . PCP NOTES >>>>>>>>>>>>>>>>> 03/28/2015  . Hypertension 03/28/2015  . Pulmonary nodules 12/09/2014  . Sleep apnea 11/14/2014  . GERD (gastroesophageal reflux disease) 11/14/2014  . Morbid obesity ([place]) 11/14/2014  . Aortic stenosis, severe   . Schizoaffective disorder, bipolar type ([place])   . Rheumatoid arthritis ([place])   . Hyperlipidemia   . Bifasicular block   .  Mononeuritis of upper limb 05/20/2013  . Pain in soft tissues of limb 05/20/2013  . Idiopathic progressive polyneuropathy 05/20/2013  . 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 Depression 01/13/2013  . Memory loss 01/13/2013    Orientation RESPIRATION BLADDER Height & Weight     Self, Time, Situation, Place  O2(Nasal Cannula 2 liters) Continent, External catheter(External Urinary Catheter) Weight: 171 lb 8.3 oz (77.8 kg) Height:  5\' 1"  (154.9 cm)  BEHAVIORAL SYMPTOMS/MOOD NEUROLOGICAL BOWEL NUTRITION STATUS      Incontinent(Type 1 seperate hard lumps) Diet(Please see DC Summary)  AMBULATORY STATUS COMMUNICATION OF NEEDS Skin   Total Care Verbally Skin abrasions, Other (Comment)(Appropriate for Ethnicity dry abrasion Ecchymosis lef right left Arm Buttocks RIght left)                       Personal Care Assistance Level of Assistance  Bathing, Feeding, Dressing Bathing Assistance: Maximum assistance Feeding assistance: Limited assistance Dressing Assistance: Maximum assistance     Functional Limitations Info  Sight, Speech, Hearing Sight Info: Adequate Hearing Info: Adequate Speech Info: Adequate    SPECIAL CARE FACTORS FREQUENCY  PT (By licensed PT), OT (By licensed OT)     PT Frequency: 5x min weekly OT Frequency: 5x min weekly            Contractures Contractures Info: Not present    Additional Factors Info  Psychotropic Code Status Info: FULL Allergies Info: Leflunomide,Morphine And Related,Ancef,Plaquenil ,hydroxychloroquine Sulfate Psychotropic Info: Depakote         Current Medications (11/29/2019):  This is the current hospital active medication list Current Facility-Administered Medications  Medication Dose  Route Frequency Provider Last Rate Last Admin  . 0.9 %  sodium chloride infusion  250 mL Intravenous PRN Hongalgi, Lenis Dickinson, MD      . acetaminophen (TYLENOL) tablet 650 mg  650 mg Oral Q6H PRN Modena Jansky, MD   650 mg at 11/26/19 1520   Or   . acetaminophen (TYLENOL) suppository 650 mg  650 mg Rectal Q6H PRN Hongalgi, Anand D, MD      . albuterol (PROVENTIL) (2.5 MG/3ML) 0.083% nebulizer solution 2.5 mg  2.5 mg Nebulization Q4H PRN Hongalgi, Anand D, MD      . ampicillin-sulbactam (UNASYN) 1.5 g in sodium chloride 0.9 % 100 mL IVPB  1.5 g Intravenous Q6H Elgergawy, Silver Huguenin, MD 200 mL/hr at 11/29/19 0834 1.5 g at 11/29/19 0834  . apixaban (ELIQUIS) tablet 2.5 mg  2.5 mg Oral BID Modena Jansky, MD   2.5 mg at 11/28/19 2050  . ARIPiprazole (ABILIFY) tablet 30 mg  30 mg Oral QHS Modena Jansky, MD   30 mg at 11/28/19 2050  . ascorbic acid (VITAMIN C) tablet 500 mg  500 mg Oral Q breakfast Hongalgi, Anand D, MD   500 mg at 11/29/19 0830  . aspirin EC tablet 81 mg  81 mg Oral Q breakfast Modena Jansky, MD   81 mg at 11/29/19 0830  . divalproex (DEPAKOTE ER) 24 hr tablet 1,000 mg  1,000 mg Oral QHS Modena Jansky, MD   1,000 mg at 11/28/19 2049  . divalproex (DEPAKOTE) DR tablet 250 mg  250 mg Oral QPC lunch Modena Jansky, MD   250 mg at 11/28/19 1351  . guaiFENesin (MUCINEX) 12 hr tablet 600 mg  600 mg Oral BID Modena Jansky, MD   600 mg at 11/28/19 2050  . magnesium sulfate IVPB 1 g 100 mL  1 g Intravenous Once Elgergawy, Silver Huguenin, MD      . mirabegron ER (MYRBETRIQ) tablet 50 mg  50 mg Oral Q breakfast Modena Jansky, MD   50 mg at 11/29/19 0831  . multivitamin with minerals tablet 1 tablet  1 tablet Oral Q breakfast Modena Jansky, MD   1 tablet at 11/29/19 0830  . oxybutynin (DITROPAN) tablet 5 mg  5 mg Oral TID WC Hongalgi, Lenis Dickinson, MD   5 mg at 11/29/19 0830  . oxyCODONE-acetaminophen (PERCOCET/ROXICET) 5-325 MG per tablet 1 tablet  1 tablet Oral Q6H PRN Modena Jansky, MD   1 tablet at 11/29/19 0427  . pantoprazole (PROTONIX) EC tablet 40 mg  40 mg Oral Daily Modena Jansky, MD   40 mg at 11/28/19 1029  . polyethylene glycol (MIRALAX / GLYCOLAX) packet 17 g  17 g Oral Daily PRN Hongalgi, Anand D, MD       . predniSONE (DELTASONE) tablet 10 mg  10 mg Oral Q breakfast Hongalgi, Lenis Dickinson, MD   10 mg at 11/29/19 0830  . senna (SENOKOT) tablet 8.6 mg  1 tablet Oral BID Modena Jansky, MD   8.6 mg at 11/28/19 2049  . sodium chloride flush (NS) 0.9 % injection 3 mL  3 mL Intravenous Q12H Modena Jansky, MD   3 mL at 11/28/19 2101  . sodium chloride flush (NS) 0.9 % injection 3 mL  3 mL Intravenous PRN Hongalgi, Anand D, MD      . triamcinolone cream (KENALOG) 0.1 % 1 application  1 application Topical BID PRN Hongalgi, Lenis Dickinson, MD  Discharge Medications: Please see discharge summary for a list of discharge medications.  Relevant Imaging Results:  Relevant Lab Results:   Additional Information [id number]  Benard Halsted, LCSW

## 2019-11-29 NOTE — Progress Notes (Signed)
PROGRESS NOTE                                                                                                                                                                                                             Patient Demographics:    Shaun Yoder, is a 74 y.o. male, DOB - [birth year], QN:[id number]  Admit date - 11/25/2019   Admitting Physician Modena Jansky, MD  Outpatient Primary MD for the patient is Colon Branch, MD  LOS - 4   Chief Complaint  Patient presents with  . Shortness of Breath       Brief Narrative     Shaun Yoder is a 74 year old married male, lives with his spouse and daughter, nonambulatory at baseline, has an Conservator, museum/gallery, hospital bed and supposed to get a Engineer, drilling lift soon via the [place], extensive PMH including iron deficiency anemia, lower extremity chronic venous insufficiency/varicose veins, history of DVT and uncertain if still on apixaban (awaiting home medications review by pharmacy), depression, GERD, HLD, HTN, schizoaffective disorder, sleep apnea but not on CPAP, chronic respiratory failure with hypoxia on nocturnal oxygen 2 L/min, rheumatoid arthritis, severe aortic stenosis, prior MSSA bacteremia and chronic left hip pain initially presented to [facility] ED on 11/25/2019 due to productive cough, dyspnea and oxygen saturations in the low 80s at home as per home health PT.  His cough has been ongoing for 2 days, brown color.  He states that he stays cold all the time.  Denies fever or chest pain.  Reports great appetite.  No dizziness, lightheadedness.  Denies choking or coughing with eating or drinking.  Denies sick contacts.  He reports that he completed 2 doses of the [employer] vaccine at the end of March.  ED Course: Afebrile, not tachypneic or tachycardic, soft but asymptomatic blood pressures (mostly in the 90s/50s), hypoxic to 88% on room air and has been on nasal cannula oxygen 2 L.  Lab work shows  unremarkable BMP, BNP one 256.6, troponin in the low 20s with a flat trend, lactate 1.3, WBC 11.2, platelets 124.  Flu panel and Covid PCR testing negative.  Urine microscopy unremarkable.  Blood and urine cultures drawn and pending.  Chest x-ray personally reviewed and has been reported as new consolidation in the right middle and lower lung zones consistent with pneumonia.  Small  right-sided pleural effusion.   Subjective:    Shaun Yoder reports his dyspnea has improved, cough has improved as well, he did well with his MBS this morning .   Assessment  & Plan :    Principal Problem:   CAP (community acquired pneumonia) Active Problems:   Aortic stenosis, severe   GERD (gastroesophageal reflux disease)   Hyperlipidemia   Hypertension   Varicose veins of lower extremities with ulcer (HCC)   Chronic left hip pain   Acute on chronic respiratory failure with hypoxia (HCC)  Lobar pneumonia (right mid and lower lobe)/community-acquired pneumonia:  -Patient chest x-ray significant for right middle and lower lobe pneumonia. - patient reports coughing during his meals, SLP is consulted, BX was done today, moderate risk for aspiration, no change in diet recommendations, he was taught some techniques by SLP during swallowing. - some concern for aspiration pneumonia, he is on IV Unasyn  Acute on chronic respiratory failure with hypoxia:  - On nocturnal home oxygen 2 L/min.  Reportedly hypoxic now even in the daytime with PT.  Likely due to lobar pneumonia.  Treat as above.  Oxygen support.  Wean as tolerated.  Incentive spirometry.  Thrombocytopenia: -  Appears to be chronic and intermittent looking at labs from 2019.  Follow CBC.  No bleeding reported.  Severe aortic stenosis:  -Avoid hypotension.  Does have soft blood pressures which may be chronic but asymptomatic.  Schizoaffective disorder/bipolar disorder: Awaiting pharmacy to perform home med recs before resuming his home  medications.  GERD: Consider PPI if taking at home after med rec completed.  Chronic left hip pain/nonambulatory state: Reportedly takes Percocet 5/325, continue.  Prolonged QTC:  -Have discussed with cardiology, Dr. Johnsie Cancel today, who reviewed his EKG, given his right bundle branch block, and fascicular block as well, it appears to be artificially elevated, more accurate reading for QTC will be around 440 .  history of DVT  - on apixaban   Schizoaffective disorder/bipolar disorder:  -Abilify and Depakote was resumed, Geodon has been held initially given prolonged QTC, I have discussed with his psychiatrist at the [facility] hospital regarding medication recommendation, and actually her recommendation is to start him on Latuda, as she was planning to stop his use on regardless, so recommendation is to continue with Latuda 40 mg every afternoon x48 hours, then increase dose to 40 mg twice daily after that, and Abilify can be stopped then .  GERD: Consider PPI if taking at home after med rec completed.  Rheumatoid arthritis: -   patient on chronic prednisone 5 mg daily. Increase to 10 mg oral daily in the hospital stay due to acute illness.  COVID-19 Labs  No results for input(s): DDIMER, FERRITIN, LDH, CRP in the last 72 hours.  Lab Results  Component Value Date   SARSCOV2NAA NEGATIVE 11/29/2019   [place] NEGATIVE 11/25/2019   [place] NEGATIVE 10/29/2019   [place] Not Detected 07/02/2019     Code Status : Full  Family Communication  : Discussed with wife at bedside  Disposition Plan  :  Status is: Inpatient  Remains inpatient appropriate because:Ongoing diagnostic testing needed not appropriate for outpatient work up   Dispo: The patient is from: Home              Anticipated d/c is to: SNF              Anticipated d/c date is: 1 day              Patient  currently is not medically stable to d/c.       Consults  : none  Procedures  :   DVT Prophylaxis  :   Eliquis  Lab Results  Component Value Date   PLT 153 11/29/2019    Antibiotics  :   Anti-infectives (From admission, onward)   Start     Dose/Rate Route Frequency Ordered Stop   11/27/19 1800  cefTRIAXone (ROCEPHIN) 2 g in sodium chloride 0.9 % 100 mL IVPB  Status:  Discontinued     2 g 200 mL/hr over 30 Minutes Intravenous Every 24 hours 11/27/19 0855 11/27/19 1250   11/27/19 1415  ampicillin-sulbactam (UNASYN) 1.5 g in sodium chloride 0.9 % 100 mL IVPB     1.5 g 200 mL/hr over 30 Minutes Intravenous Every 6 hours 11/27/19 1409     11/26/19 1430  doxycycline (VIBRA-TABS) tablet 100 mg  Status:  Discontinued     100 mg Oral Every 12 hours 11/26/19 1407 11/27/19 1250   11/25/19 1800  cefTRIAXone (ROCEPHIN) 1 g in sodium chloride 0.9 % 100 mL IVPB  Status:  Discontinued     1 g 200 mL/hr over 30 Minutes Intravenous Every 24 hours 11/25/19 1746 11/27/19 0855   11/25/19 1800  azithromycin (ZITHROMAX) 500 mg in sodium chloride 0.9 % 250 mL IVPB  Status:  Discontinued     500 mg 250 mL/hr over 60 Minutes Intravenous Every 24 hours 11/25/19 1746 11/26/19 1407   11/25/19 1745  levofloxacin (LEVAQUIN) IVPB 750 mg  Status:  Discontinued     750 mg 100 mL/hr over 90 Minutes Intravenous  Once 11/25/19 1737 11/25/19 1746        Objective:   Vitals:   11/28/19 2000 11/29/19 0416 11/29/19 0501 11/29/19 0739  BP: 104/73 101/72  (!) 82/64  Pulse: 81 69  65  Resp: 19 (!) 25  19  Temp: 98.8 F (37.1 C) 98.1 F (36.7 C)  98.5 F (36.9 C)  TempSrc: Oral Oral  Oral  SpO2: 90% 90%  92%  Weight:   77.8 kg   Height:        Wt Readings from Last 3 Encounters:  11/29/19 77.8 kg  11/02/19 77.6 kg  10/29/19 77.6 kg     Intake/Output Summary (Last 24 hours) at 11/29/2019 1544 Last data filed at 11/29/2019 1129 Gross per 24 hour  Intake 520 ml  Output 1601 ml  Net -1081 ml     Physical Exam  Awake Alert, Oriented X 3, No new F.N deficits, Normal affect Symmetrical Chest wall  movement, Good air movement bilaterally, CTAB RRR,No Gallops,Rubs or new Murmurs, No Parasternal Heave +ve B.Sounds, Abd Soft, No tenderness, No rebound - guarding or rigidity. No Cyanosis, chronic lower extremity changes.     Data Review:    CBC Recent Labs  Lab 11/25/19 1743 11/27/19 0519 11/28/19 0538 11/29/19 0231  WBC 11.2* 5.6 5.9 5.5  HGB 12.4* 10.8* 10.6* 11.3*  HCT 37.1* 33.8* 33.3* 34.8*  PLT 124* 128* 129* 153  MCV 95.4 96.3 95.7 94.1  MCH 31.9 30.8 30.5 30.5  MCHC 33.4 32.0 31.8 32.5  RDW 14.8 14.4 14.4 14.0  LYMPHSABS 0.9  --   --   --   MONOABS 0.9  --   --   --   EOSABS 0.0  --   --   --   BASOSABS 0.0  --   --   --     Chemistries  Recent Labs  Lab 11/25/19  1743 11/27/19 0519 11/28/19 0538 11/29/19 0231  NA 138 136 137 134*  K 4.4 4.2 4.1 4.1  CL 102 102 102 101  CO2 28 25 27 27   GLUCOSE 103* 85 91 100*  BUN 24* 17 15 13   CREATININE 0.46* 0.38* 0.51* 0.55*  CALCIUM 8.1* 8.1* 8.3* 8.0*  MG  --   --  1.6* 1.9   ------------------------------------------------------------------------------------------------------------------ No results for input(s): CHOL, HDL, LDLCALC, TRIG, CHOLHDL, LDLDIRECT in the last 72 hours.  Lab Results  Component Value Date   HGBA1C 4.9 03/01/2018   ------------------------------------------------------------------------------------------------------------------ No results for input(s): TSH, T4TOTAL, T3FREE, THYROIDAB in the last 72 hours.  Invalid input(s): FREET3 ------------------------------------------------------------------------------------------------------------------ No results for input(s): VITAMINB12, FOLATE, FERRITIN, TIBC, IRON, RETICCTPCT in the last 72 hours.  Coagulation profile Recent Labs  Lab 11/25/19 1743  INR 1.4*    No results for input(s): DDIMER in the last 72 hours.  Cardiac Enzymes No results for input(s): CKMB, TROPONINI, MYOGLOBIN in the last 168 hours.  Invalid input(s):  CK ------------------------------------------------------------------------------------------------------------------    Component Value Date/Time   BNP 1,256.6 (H) 11/25/2019 1743    Inpatient Medications  Scheduled Meds: . apixaban  2.5 mg Oral BID  . ARIPiprazole  30 mg Oral QHS  . vitamin C  500 mg Oral Q breakfast  . aspirin EC  81 mg Oral Q breakfast  . divalproex  1,000 mg Oral QHS  . divalproex  250 mg Oral QPC lunch  . guaiFENesin  600 mg Oral BID  . lurasidone  40 mg Oral Q1200  . mirabegron ER  50 mg Oral Q breakfast  . multivitamin with minerals  1 tablet Oral Q breakfast  . oxybutynin  5 mg Oral TID WC  . pantoprazole  40 mg Oral Daily  . predniSONE  10 mg Oral Q breakfast  . senna  1 tablet Oral BID  . sodium chloride flush  3 mL Intravenous Q12H   Continuous Infusions: . sodium chloride    . ampicillin-sulbactam (UNASYN) IV 1.5 g (11/29/19 1336)   PRN Meds:.sodium chloride, acetaminophen **OR** acetaminophen, albuterol, oxyCODONE-acetaminophen, polyethylene glycol, sodium chloride flush, triamcinolone cream  Micro Results Recent Results (from the past 240 hour(s))  Respiratory Panel by RT PCR (Flu A&B, Covid) - Nasopharyngeal Swab     Status: None   Collection Time: 11/25/19  5:43 PM   Specimen: Nasopharyngeal Swab  Result Value Ref Range Status   SARS Coronavirus 2 by RT PCR NEGATIVE NEGATIVE Final    Comment: (NOTE) SARS-CoV-2 target nucleic acids are NOT DETECTED. The SARS-CoV-2 RNA is generally detectable in upper respiratoy specimens during the acute phase of infection. The lowest concentration of SARS-CoV-2 viral copies this assay can detect is 131 copies/mL. A negative result does not preclude SARS-Cov-2 infection and should not be used as the sole basis for treatment or other patient management decisions. A negative result may occur with  improper specimen collection/handling, submission of specimen other than nasopharyngeal swab, presence of  viral mutation(s) within the areas targeted by this assay, and inadequate number of viral copies (<131 copies/mL). A negative result must be combined with clinical observations, patient history, and epidemiological information. The expected result is Negative. Fact Sheet for Patients:  PinkCheek.be Fact Sheet for Healthcare Providers:  GravelBags.it This test is not yet ap proved or cleared by the [place] FDA and  has been authorized for detection and/or diagnosis of SARS-CoV-2 by FDA under an Emergency Use Authorization (EUA). This EUA will remain  in effect (meaning  this test can be used) for the duration of the COVID-19 declaration under Section 564(b)(1) of the Act, 21 U.S.C. section 360bbb-3(b)(1), unless the authorization is terminated or revoked sooner.    Influenza A by PCR NEGATIVE NEGATIVE Final   Influenza B by PCR NEGATIVE NEGATIVE Final    Comment: (NOTE) The Xpert Xpress SARS-CoV-2/FLU/RSV assay is intended as an aid in  the diagnosis of influenza from Nasopharyngeal swab specimens and  should not be used as a sole basis for treatment. Nasal washings and  aspirates are unacceptable for Xpert Xpress SARS-CoV-2/FLU/RSV  testing. Fact Sheet for Patients: PinkCheek.be Fact Sheet for Healthcare Providers: GravelBags.it This test is not yet approved or cleared by the [place] FDA and  has been authorized for detection and/or diagnosis of SARS-CoV-2 by  FDA under an Emergency Use Authorization (EUA). This EUA will remain  in effect (meaning this test can be used) for the duration of the  Covid-19 declaration under Section 564(b)(1) of the Act, 21  U.S.C. section 360bbb-3(b)(1), unless the authorization is  terminated or revoked. Performed at [facility], [address] [place], [place] [address]   Blood Culture (routine x 2)      Status: None (Preliminary result)   Collection Time: 11/25/19  5:45 PM   Specimen: BLOOD RIGHT FOREARM  Result Value Ref Range Status   Specimen Description   Final    BLOOD RIGHT FOREARM Performed at [facility], [address] [place], [place] [address]    Special Requests   Final    BOTTLES DRAWN AEROBIC AND ANAEROBIC Blood Culture adequate volume Performed at [facility], [address] [place], [place] [address]    Culture   Final    NO GROWTH 4 DAYS Performed at [facility], [place] [address], [place], [place] [address]    Report Status PENDING  Incomplete  Blood Culture (routine x 2)     Status: None (Preliminary result)   Collection Time: 11/25/19  6:00 PM   Specimen: BLOOD  Result Value Ref [place] Status   Specimen Description   Final    BLOOD LEFT ANTECUBITAL Performed at [facility], [address] [place], [place] [address]    Special Requests   Final    BOTTLES DRAWN AEROBIC AND ANAEROBIC Blood Culture adequate volume Performed at [facility], [address] [place], [place] [address]    Culture  Setup Time   Final    GRAM POSITIVE COCCI IN CLUSTERS ANAEROBIC BOTTLE ONLY Organism ID to follow CRITICAL RESULT CALLED TO, READ BACK BY AND VERIFIED WITH: Fayne Mediate, AT 1756 11/28/19 Performed at [facility], [place] [address], [place], [place] [address]    Culture GRAM POSITIVE COCCI  Final   Report Status PENDING  Incomplete  Blood Culture ID Panel (Reflexed)     Status: None   Collection Time: 11/25/19  6:00 PM  Result Value Ref Range Status   Enterococcus species NOT DETECTED NOT DETECTED Final   Listeria monocytogenes NOT DETECTED NOT DETECTED Final   Staphylococcus species NOT DETECTED NOT DETECTED Final   Staphylococcus aureus (BCID) NOT DETECTED NOT DETECTED Final   Streptococcus species NOT DETECTED NOT DETECTED Final   Streptococcus agalactiae NOT DETECTED NOT DETECTED Final    Streptococcus pneumoniae NOT DETECTED NOT DETECTED Final   Streptococcus pyogenes NOT DETECTED NOT DETECTED Final   Acinetobacter baumannii NOT DETECTED NOT DETECTED  Final   Enterobacteriaceae species NOT DETECTED NOT DETECTED Final   Enterobacter cloacae complex NOT DETECTED NOT DETECTED Final   Escherichia coli NOT DETECTED NOT DETECTED Final   Klebsiella oxytoca NOT DETECTED NOT DETECTED Final   Klebsiella pneumoniae NOT DETECTED NOT DETECTED Final   Proteus species NOT DETECTED NOT DETECTED Final   Serratia marcescens NOT DETECTED NOT DETECTED Final   Haemophilus influenzae NOT DETECTED NOT DETECTED Final   Neisseria meningitidis NOT DETECTED NOT DETECTED Final   Pseudomonas aeruginosa NOT DETECTED NOT DETECTED Final   Candida albicans NOT DETECTED NOT DETECTED Final   Candida glabrata NOT DETECTED NOT DETECTED Final   Candida krusei NOT DETECTED NOT DETECTED Final   Candida parapsilosis NOT DETECTED NOT DETECTED Final   Candida tropicalis NOT DETECTED NOT DETECTED Final    Comment: Performed at [facility], [place] [address], [place], [place] [address]  Urine culture     Status: Abnormal   Collection Time: 11/25/19 10:31 PM   Specimen: In/Out Cath Urine  Result Value Ref [place] Status   Specimen Description   Final    IN/OUT CATH URINE Performed at [facility], [address] [place], [place] [address]    Special Requests   Final    NONE Performed at [facility], [address] [place], [place] [address]    Culture (A)  Final    <10,000 COLONIES/mL INSIGNIFICANT GROWTH Performed at [facility], [place] [address] [place],  [address]    Report Status 11/27/2019 FINAL  Final  SARS CORONAVIRUS 2 (TAT 6-24 HRS) Nasopharyngeal Nasopharyngeal Swab     Status: None   Collection Time: 11/29/19  8:52 AM   Specimen: Nasopharyngeal Swab  Result Value Ref [place] Status   SARS Coronavirus 2 NEGATIVE NEGATIVE Final    Comment: (NOTE) SARS-CoV-2  target nucleic acids are NOT DETECTED. The SARS-CoV-2 RNA is generally detectable in upper and lower respiratory specimens during the acute phase of infection. Negative results do not preclude SARS-CoV-2 infection, do not rule out co-infections with other pathogens, and should not be used as the sole basis for treatment or other patient management decisions. Negative results must be combined with clinical observations, patient history, and epidemiological information. The expected result is Negative. Fact Sheet for Patients: SugarRoll.be Fact Sheet for Healthcare Providers: https://www.woods-mathews.com/ This test is not yet approved or cleared by the [place] FDA and  has been authorized for detection and/or diagnosis of SARS-CoV-2 by FDA under an Emergency Use Authorization (EUA). This EUA will remain  in effect (meaning this test can be used) for the duration of the COVID-19 declaration under Section 56 4(b)(1) of the Act, 21 U.S.C. section 360bbb-3(b)(1), unless the authorization is terminated or revoked sooner. Performed at [facility], [place] [address] [place],  [address]     Radiology Reports DG Chest Portable 1 View  Result Date: 11/25/2019 CLINICAL DATA:  Shortness of breath. EXAM: PORTABLE CHEST 1 VIEW COMPARISON:  10/29/2019 FINDINGS: There is new consolidation involving the right mid and right lower lung zones. There is no pneumothorax. There is a small right-sided pleural effusion. There is some atelectasis versus scarring at the lung bases bilaterally. Chronic changes are noted of the bilateral humeral heads. The heart size is stable. IMPRESSION: 1. New consolidation in the right mid and lower lung zones consistent with pneumonia. 2. Small right-sided pleural effusion. Electronically Signed   By: Constance Holster M.D.   On:  11/25/2019 17:29   DG Swallowing Func-Speech Pathology  Result Date: 11/29/2019 Objective  Swallowing Evaluation: Type of Study: MBS-Modified Barium Swallow Study  Patient Details Name: Shaun Yoder MRN: [id number] Date of Birth: [birth year] Today's Date: 11/29/2019 Time: SLP Start Time (ACUTE ONLY): 1200 -SLP Stop Time (ACUTE ONLY): 1230 SLP Time Calculation (min) (ACUTE ONLY): 30 min Past Medical History: Past Medical History: Diagnosis Date . Anemia  . Cancer ([place])   bladder . Chronic venous insufficiency  . Colitis  . Colon polyps  . Depression  . Diverticulosis  . DVT of deep femoral vein ([place])  . Gastritis  . GERD (gastroesophageal reflux disease)  . History of rectal polyps  . Hyperlipidemia  . Hypertension  . Iron deficiency  . Low back pain  . Lymphedema of both lower extremities  . OA (osteoarthritis)  . 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 Schizoaffective disorder, bipolar type ([place])  . Sleep apnea  . Varicose vein  Past Surgical History: Past Surgical History: Procedure Laterality Date . BLADDER SURGERY   . TOTAL KNEE ARTHROPLASTY Bilateral  . VARICOSE VEIN SURGERY   HPI: Shaun Yoder is a 74 year old married male, lives with his spouse and daughter, nonambulatory at baseline, extensive PMH including iron deficiency anemia, lower extremity chronic venous insufficiency/varicose veins, history of DVT and uncertain if still on apixaban (awaiting home medications review by pharmacy), depression, GERD, HLD, HTN, schizoaffective disorder, sleep apnea but not on CPAP, chronic respiratory failure with hypoxia on nocturnal oxygen 2 L/min, rheumatoid arthritis, severe aortic stenosis, prior MSSA bacteremia and chronic left hip pain initially presented to [facility] ED on 11/25/2019 due to productive cough, dyspnea and oxygen saturations in the low 80s at home as per home health PT. Diagnosed with right mid and lower lobe PNA.  Reports coughing with pos. Barium swallow complete in 2016 indicated significant barium retention in the vallecula, moderate esophageal dysmotility with suggested chronic  reflux. No aspiration or penetration. [facility] no report available however this SLP did view study and patient did not appear to aspiration, mild residuals noted post swallow along base of tongue, vallecula, UES.  No data recorded Assessment / Plan / Recommendation CHL IP CLINICAL IMPRESSIONS 11/29/2019 Clinical Impression Pt demonstrates a mild pharyngeal dysphagia with silent frank penetration and trace aspiration events. Deficits are due to appearance of mild weakness, including decreased strangth of laryngeal clsoure during the swallow and also mild base of tongue weakness with entrapment of liquids and pooling in the sinuses post swallow. Attempted a variety of strategies, best method during this study was following sips with a throat clear and second swallow. Esophageal sweep unrevealing except when pill did lodge mid esophagus, cleared with a second swallow. Given that pt reports an oral infection about a month prior to this hospitalization, improving oral care with denture removal and cleaning three times a day and overnight may improve oral bacterial load. Discussed with pt. Will f/u for education.  SLP Visit Diagnosis Dysphagia, oropharyngeal phase (R13.12) Attention and concentration deficit following -- Frontal lobe and executive function deficit following -- Impact on safety and function --   CHL IP TREATMENT RECOMMENDATION 11/29/2019 Treatment Recommendations Therapy as outlined in treatment plan below   No flowsheet data found. CHL IP DIET RECOMMENDATION 11/29/2019 SLP Diet Recommendations Regular solids;Thin liquid Liquid Administration via Cup;Straw Medication Administration Whole meds with liquid Compensations Slow rate;Small sips/bites;Clear throat after each swallow;Multiple dry swallows after each bite/sip Postural Changes Remain semi-upright after after  feeds/meals (Comment)   CHL IP OTHER RECOMMENDATIONS 11/29/2019 Recommended Consults -- Oral Care  Recommendations Oral care before and after PO Other Recommendations --   CHL IP FOLLOW UP RECOMMENDATIONS 11/29/2019 Follow up Recommendations Skilled Nursing facility   [facility] IP FREQUENCY AND DURATION 11/29/2019 Speech Therapy Frequency (ACUTE ONLY) min 2x/week Treatment Duration 2 weeks      CHL IP ORAL PHASE 11/29/2019 Oral Phase WFL Oral - Pudding Teaspoon -- Oral - Pudding Cup -- Oral - Honey Teaspoon -- Oral - Honey Cup -- Oral - Nectar Teaspoon -- Oral - Nectar Cup -- Oral - Nectar Straw -- Oral - Thin Teaspoon -- Oral - Thin Cup -- Oral - Thin Straw -- Oral - Puree -- Oral - Mech Soft -- Oral - Regular -- Oral - Multi-Consistency -- Oral - Pill -- Oral Phase - Comment --  CHL IP PHARYNGEAL PHASE 11/29/2019 Pharyngeal Phase Impaired Pharyngeal- Pudding Teaspoon -- Pharyngeal -- Pharyngeal- Pudding Cup -- Pharyngeal -- Pharyngeal- Honey Teaspoon -- Pharyngeal -- Pharyngeal- Honey Cup -- Pharyngeal -- Pharyngeal- Nectar Teaspoon -- Pharyngeal -- Pharyngeal- Nectar Cup -- Pharyngeal -- Pharyngeal- Nectar Straw -- Pharyngeal -- Pharyngeal- Thin Teaspoon -- Pharyngeal -- Pharyngeal- Thin Cup Reduced airway/laryngeal closure;Reduced tongue base retraction;Penetration/Aspiration during swallow;Penetration/Apiration after swallow;Trace aspiration;Pharyngeal residue - valleculae;Pharyngeal residue - pyriform;Compensatory strategies attempted (with notebox) Pharyngeal Material enters airway, passes BELOW cords without attempt by patient to eject out (silent aspiration);Material enters airway, CONTACTS cords and not ejected out;Material does not enter airway Pharyngeal- Thin Straw Reduced airway/laryngeal closure;Reduced tongue base retraction;Penetration/Aspiration during swallow;Penetration/Apiration after swallow;Trace aspiration;Pharyngeal residue - valleculae;Pharyngeal residue - pyriform;Compensatory strategies attempted (with notebox) Pharyngeal Material enters airway, CONTACTS cords and not ejected out;Material enters  airway, passes BELOW cords without attempt by patient to eject out (silent aspiration) Pharyngeal- Puree Pharyngeal residue - valleculae;Pharyngeal residue - pyriform Pharyngeal -- Pharyngeal- Mechanical Soft -- Pharyngeal -- Pharyngeal- Regular Pharyngeal residue - valleculae;Pharyngeal residue - pyriform Pharyngeal -- Pharyngeal- Multi-consistency -- Pharyngeal -- Pharyngeal- Pill -- Pharyngeal -- Pharyngeal Comment --  CHL IP CERVICAL ESOPHAGEAL PHASE 11/29/2019 Cervical Esophageal Phase WFL Pudding Teaspoon -- Pudding Cup -- Honey Teaspoon -- Honey Cup -- Nectar Teaspoon -- Nectar Cup -- Nectar Straw -- Thin Teaspoon -- Thin Cup -- Thin Straw -- Puree -- Mechanical Soft -- Regular -- Multi-consistency -- Pill -- Cervical Esophageal Comment -- Herbie Baltimore, MA CCC-SLP Acute Rehabilitation Services Pager [phone] Office [phone] Lynann Beaver 11/29/2019, 1:25 PM                Phillips Climes M.D on 11/29/2019 at 3:44 PM  Between 7am to 7pm - Pager - [phone]  After 7pm go to [email or web address] - password [facility]  [facility] -  Office  [phone]

## 2019-11-29 NOTE — Telephone Encounter (Signed)
Received fax confirmation

## 2019-11-29 NOTE — Telephone Encounter (Signed)
Plan of care signed and faxed back to [facility]- [phone]. Form sent for scanning.

## 2019-11-29 NOTE — Telephone Encounter (Signed)
Caller Arlene   Call Back # [phone]   Covid Vaccine   Levan Hurst  First Dose :March 4,2021   Second Dose: April 1,2021

## 2019-11-29 NOTE — Progress Notes (Signed)
RE: Shaun Yoder Date of Birth: 2045/10/14 Date: 11/29/19   Please be advised that the above-named patient will require a short-term nursing home stay - anticipated 30 days or less for rehabilitation and strengthening.  The plan is for return home.

## 2019-11-29 NOTE — Progress Notes (Signed)
Modified Barium Swallow Progress Note  Patient Details  Name: Shaun Yoder MRN: [id number] Date of Birth: March 27, 1946  Today's Date: 11/29/2019  Modified Barium Swallow completed.  Full report located under Chart Review in the Imaging Section.  Brief recommendations include the following:  Clinical Impression  Pt demonstrates a mild pharyngeal dysphagia with silent frank penetration and trace aspiration events. Deficits are due to appearance of mild weakness, including decreased strength of laryngeal clsoure during the swallow and also mild base of tongue weakness with entrapment of liquids and pooling in the sinuses post swallow. Attempted a variety of strategies, best method during this study was following sips with a throat clear and second swallow. Esophageal sweep unrevealing except when pill did lodge mid esophagus, cleared with a second swallow. Given that pt reports an oral infection about a month prior to this hospitalization, improving oral care with denture removal and cleaning three times a day and overnight may improve oral bacterial load. Discussed with pt. Will f/u for education.    Swallow Evaluation Recommendations       SLP Diet Recommendations: Regular solids;Thin liquid   Liquid Administration via: Cup;Straw   Medication Administration: Whole meds with liquid   Supervision: Patient able to self feed   Compensations: Slow rate;Small sips/bites;Clear throat after each swallow;Multiple dry swallows after each bite/sip   Postural Changes: Remain semi-upright after after feeds/meals (Comment)   Oral Care Recommendations: Oral care before and after PO        Brax Walen, Katherene Ponto 11/29/2019,1:24 PM

## 2019-11-29 NOTE — TOC Progression Note (Addendum)
Transition of Care [facility]) - Progression Note    Patient Details  Name: Shaun Yoder MRN: [id number] Date of Birth: 1945-08-19  Transition of Care [facility]) CM/SW [place], LCSW Phone Number: 11/29/2019, 3:11 PM  Clinical Narrative:    CSW received voicemail from [facility] stating that patient's wife had contacted them requesting long term placement. CSW left them a voicemail to see if patient would be eligible and faxed packet for review. However, if facility bed is difficult to find, patient will need to discharge to SNF using his Medicare benefit and family can continue VA placement process once bed is available after discharge.       CSW provided current bed offers to spouse ([facility], [facility], [facility]).    Barriers to Discharge: Continued Medical Work up  Expected Discharge Plan and Services                                                 Social Determinants of Health (SDOH) Interventions    Readmission Risk Interventions No flowsheet data found.

## 2019-11-29 NOTE — Telephone Encounter (Signed)
Pt wife dropped off document to be filled out by provider for daughter that is helping out with pt, (daughter Mashaun Mumaw- FMLA 4 pages) Daughter would like document to be faxed to [phone] attention to Frances Furbish. Document put at front office tray under providers name.

## 2019-11-29 NOTE — Telephone Encounter (Signed)
Spoke w/ Janae Bridgeman- Pt's daughter [name] requesting intermittent leave. Forms completed and faxed to Frances Furbish at number provided. Copy of form sent for scanning.

## 2019-11-30 LAB — CULTURE, BLOOD (ROUTINE X 2)
Culture: NO GROWTH
Special Requests: ADEQUATE

## 2019-11-30 MED ORDER — LURASIDONE HCL 40 MG PO TABS
40.0000 mg | ORAL_TABLET | Freq: Two times a day (BID) | ORAL | Status: DC
  Administered 2019-12-01: 11:00:00 40 mg via ORAL
  Filled 2019-11-30 (×2): qty 1

## 2019-11-30 NOTE — TOC Progression Note (Addendum)
Transition of Care [facility]) - Progression Note    Patient Details  Name: Shaun Yoder MRN: [id number] Date of Birth: 1945-12-28  Transition of Care [facility]) CM/SW Contact  Carles Collet, RN Phone Number: 11/30/2019, 10:57 AM  Clinical Narrative:     [name] [id number] E expires 12/29/2019 Updated FL2, and created new note. Spoke w wife, provided her 4 bed offers. Explained that we will likely need to use his Medicare coverage as we have not heard back from [place]. Emailed her link to [employer] website to look up reviews. [email or web address]  Received call from [place], they confirmed that patient has indefinite contract for long stay continuous care at a [place] facility, however there remains no [place] auth at this time.       Barriers to Discharge: Continued Medical Work up  Expected Discharge Plan and Services                                                 Social Determinants of Health (SDOH) Interventions    Readmission Risk Interventions No flowsheet data found.

## 2019-11-30 NOTE — Progress Notes (Signed)
  Speech Language Pathology Treatment: Dysphagia  Patient Details Name: Shaun Yoder MRN: [id number] DOB: Mar 26, 1946 Today's Date: 11/30/2019 Time: 9150-5697 SLP Time Calculation (min) (ACUTE ONLY): 26 min  Assessment / Plan / Recommendation Clinical Impression  Patient seen for f/u treatment after MBS 5/10. Patient able to recall results of MBS with min assist for recall of penetration episodes and use of throat clear/dry swallow to aid in clearing and mitigate risks of aspiration. Skilled observation complete as patient self fed am meal. With initial minimal cueing including use of written cues placed within eye sight, patient then able to consume prescribed diet with modified independence for use of compensatory strategies/aspiration precautions. No overt indication of aspiration observed. Vital signs stable, no indicative of increasing infection. No further SLP needs indicated at this time. Use of compensatory strategies likely necessary long term at this point given chronic nature of oropharyngeal and esophageal deficits.     HPI HPI: Shaun Yoder is a 74 year old married male, lives with his spouse and daughter, nonambulatory at baseline, extensive PMH including iron deficiency anemia, lower extremity chronic venous insufficiency/varicose veins, history of DVT and uncertain if still on apixaban (awaiting home medications review by pharmacy), depression, GERD, HLD, HTN, schizoaffective disorder, sleep apnea but not on CPAP, chronic respiratory failure with hypoxia on nocturnal oxygen 2 L/min, rheumatoid arthritis, severe aortic stenosis, prior MSSA bacteremia and chronic left hip pain initially presented to [facility] ED on 11/25/2019 due to productive cough, dyspnea and oxygen saturations in the low 80s at home as per home health PT. Diagnosed with right mid and lower lobe PNA.  Reports coughing with pos. Barium swallow complete in 2016 indicated significant barium retention in the vallecula,  moderate esophageal dysmotility with suggested chronic reflux. No aspiration or penetration. [facility]BS complete in 2019 [facility] no report available however this SLP did view study and patient did not appear to aspiration, mild residuals noted post swallow along base of tongue, vallecula, UES.       SLP Plan  All goals met       Recommendations  Diet recommendations: Regular;Thin liquid Liquids provided via: Cup;Straw Medication Administration: Whole meds with puree Supervision: Patient able to self feed;Intermittent supervision to cue for compensatory strategies Compensations: Slow rate;Small sips/bites;Clear throat after each swallow;Multiple dry swallows after each bite/sip Postural Changes and/or Swallow Maneuvers: Seated upright 90 degrees;Upright 30-60 min after meal                Oral Care Recommendations: Oral care BID Follow up Recommendations: Skilled Nursing facility SLP Visit Diagnosis: Dysphagia, oropharyngeal phase (R13.12) Plan: All goals met       GO             Shaun Rainwater MA, CCC-SLP     Shaun Yoder 11/30/2019, 10:02 AM

## 2019-11-30 NOTE — NC FL2 (Signed)
[place] LEVEL OF CARE SCREENING TOOL     IDENTIFICATION  Patient Name: Shaun Yoder Birthdate: 09-Oct-1945 Sex: male Admission Date (Current Location): 11/25/2019  [facility] and [place] Number:  Herbalist and Address:  [facility] [place] [address] [place] [place] [employer], [place] [address]      Provider Number: [id number]  Attending Physician Name and Address:  Albertine Patricia, MD  Relative Name and Phone Number:  Janae Bridgeman [phone]    Current Level of Care: Hospital Recommended Level of Care: [place] Prior Approval Number:    Date Approved/Denied:   PASRR Number: [id number] E expires 12/29/2019  Discharge Plan: SNF    Current Diagnoses: Patient Active Problem List   Diagnosis Date Noted  . Acute on chronic respiratory failure with hypoxia ([place]) 11/26/2019  . CAP (community acquired pneumonia) 11/25/2019  . Chronic respiratory failure with hypoxia ([place]) 03/19/2018  . Bacteremia due to methicillin susceptible Staphylococcus aureus (MSSA) 12/26/2017  . Nocturnal hypoxemia 02/11/2017  . DJD (degenerative joint disease) 12/06/2016  . Chronic right shoulder pain 12/06/2016  . Contracture of joint of right shoulder region 12/06/2016  . Chronic left hip pain 09/12/2016  . Status post bilateral knee replacements 09/12/2016  . Gait disorder 06/03/2016  . Left leg DVT ([place]) 04/04/2016  . Non-pressure chronic ulcer of right ankle with fat layer exposed ([place]) 10/25/2015  . Varicose veins of lower extremities with ulcer ([place]) 09/07/2015  . Annual physical exam 05/23/2015  . PCP NOTES >>>>>>>>>>>>>>>>> 03/28/2015  . Hypertension 03/28/2015  . Pulmonary nodules 12/09/2014  . Sleep apnea 11/14/2014  . GERD (gastroesophageal reflux disease) 11/14/2014  . Morbid obesity ([place]) 11/14/2014  . Aortic stenosis, severe   . Schizoaffective disorder, bipolar type ([place])   . Rheumatoid arthritis ([place])   . Hyperlipidemia   .  Bifasicular block   . Mononeuritis of upper limb 05/20/2013  . Pain in soft tissues of limb 05/20/2013  . Idiopathic progressive polyneuropathy 05/20/2013  . 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 Depression 01/13/2013  . Memory loss 01/13/2013    Orientation RESPIRATION BLADDER Height & Weight     Self, Time, Situation, Place  O2(Nasal Cannula 2 liters) Continent, External catheter(External Urinary Catheter) Weight: 76.5 kg Height:  5\' 1"  (154.9 cm)  BEHAVIORAL SYMPTOMS/MOOD NEUROLOGICAL BOWEL NUTRITION STATUS      Incontinent(Type 1 seperate hard lumps) Diet(Please see DC Summary)  AMBULATORY STATUS COMMUNICATION OF NEEDS Skin   Total Care Verbally Skin abrasions, Other (Comment)(Appropriate for Ethnicity dry abrasion Ecchymosis lef right left Arm Buttocks RIght left)                       Personal Care Assistance Level of Assistance  Bathing, Feeding, Dressing Bathing Assistance: Maximum assistance Feeding assistance: Limited assistance Dressing Assistance: Maximum assistance     Functional Limitations Info  Sight, Speech, Hearing Sight Info: Adequate Hearing Info: Adequate Speech Info: Adequate    SPECIAL CARE FACTORS FREQUENCY  PT (By licensed PT), OT (By licensed OT)     PT Frequency: 5x min weekly OT Frequency: 5x min weekly            Contractures Contractures Info: Not present    Additional Factors Info  Psychotropic Code Status Info: FULL Allergies Info: Leflunomide,Morphine And Related,Ancef,Plaquenil ,hydroxychloroquine Sulfate Psychotropic Info: Depakote         Current Medications (11/30/2019):  This is the current hospital active medication list Current Facility-Administered Medications  Medication Dose Route Frequency  Provider Last Rate Last Admin  . 0.9 %  sodium chloride infusion  250 mL Intravenous PRN Hongalgi, Lenis Dickinson, MD      . acetaminophen (TYLENOL) tablet 650 mg  650 mg Oral Q6H PRN Modena Jansky, MD   650 mg at 11/26/19  1520   Or  . acetaminophen (TYLENOL) suppository 650 mg  650 mg Rectal Q6H PRN Hongalgi, Anand D, MD      . albuterol (PROVENTIL) (2.5 MG/3ML) 0.083% nebulizer solution 2.5 mg  2.5 mg Nebulization Q4H PRN Hongalgi, Anand D, MD      . ampicillin-sulbactam (UNASYN) 1.5 g in sodium chloride 0.9 % 100 mL IVPB  1.5 g Intravenous Q6H Elgergawy, Silver Huguenin, MD 200 mL/hr at 11/30/19 0912 1.5 g at 11/30/19 0912  . apixaban (ELIQUIS) tablet 2.5 mg  2.5 mg Oral BID Modena Jansky, MD   2.5 mg at 11/30/19 0920  . ARIPiprazole (ABILIFY) tablet 30 mg  30 mg Oral QHS Modena Jansky, MD   30 mg at 11/29/19 2205  . ascorbic acid (VITAMIN C) tablet 500 mg  500 mg Oral Q breakfast Modena Jansky, MD   500 mg at 11/30/19 0904  . aspirin EC tablet 81 mg  81 mg Oral Q breakfast Modena Jansky, MD   81 mg at 11/30/19 0904  . divalproex (DEPAKOTE ER) 24 hr tablet 1,000 mg  1,000 mg Oral QHS Modena Jansky, MD   1,000 mg at 11/29/19 2205  . divalproex (DEPAKOTE) DR tablet 250 mg  250 mg Oral QPC lunch Modena Jansky, MD   250 mg at 11/29/19 1331  . guaiFENesin (MUCINEX) 12 hr tablet 600 mg  600 mg Oral BID Modena Jansky, MD   600 mg at 11/30/19 0920  . lurasidone (LATUDA) tablet 40 mg  40 mg Oral Q1200 Elgergawy, Silver Huguenin, MD   40 mg at 11/29/19 1846  . mirabegron ER (MYRBETRIQ) tablet 50 mg  50 mg Oral Q breakfast Modena Jansky, MD   50 mg at 11/30/19 0906  . multivitamin with minerals tablet 1 tablet  1 tablet Oral Q breakfast Modena Jansky, MD   1 tablet at 11/30/19 0905  . oxybutynin (DITROPAN) tablet 5 mg  5 mg Oral TID WC Hongalgi, Lenis Dickinson, MD   5 mg at 11/30/19 0905  . oxyCODONE-acetaminophen (PERCOCET/ROXICET) 5-325 MG per tablet 1 tablet  1 tablet Oral Q6H PRN Modena Jansky, MD   1 tablet at 11/29/19 2205  . pantoprazole (PROTONIX) EC tablet 40 mg  40 mg Oral Daily Modena Jansky, MD   40 mg at 11/29/19 1049  . polyethylene glycol (MIRALAX / GLYCOLAX) packet 17 g  17 g Oral Daily  PRN Hongalgi, Anand D, MD      . predniSONE (DELTASONE) tablet 10 mg  10 mg Oral Q breakfast Modena Jansky, MD   10 mg at 11/30/19 0905  . senna (SENOKOT) tablet 8.6 mg  1 tablet Oral BID Modena Jansky, MD   8.6 mg at 11/29/19 2205  . sodium chloride flush (NS) 0.9 % injection 3 mL  3 mL Intravenous Q12H Modena Jansky, MD   3 mL at 11/30/19 0908  . sodium chloride flush (NS) 0.9 % injection 3 mL  3 mL Intravenous PRN Hongalgi, Anand D, MD      . triamcinolone cream (KENALOG) 0.1 % 1 application  1 application Topical BID PRN Hongalgi, Lenis Dickinson, MD  Discharge Medications: Please see discharge summary for a list of discharge medications.  Relevant Imaging Results:  Relevant Lab Results:   Additional Information [id number]  Carles Collet, RN

## 2019-11-30 NOTE — Progress Notes (Signed)
PROGRESS NOTE                                                                                                                                                                                                             Patient Demographics:    Shaun Yoder, is a 74 y.o. male, DOB - 1945/12/06, PC:[id number]  Admit date - 11/25/2019   Admitting Physician Modena Jansky, MD  Outpatient Primary MD for the patient is Colon Branch, MD  LOS - 5   Chief Complaint  Patient presents with  . Shortness of Breath       Brief Narrative     Shaun Yoder is a 74 year old married male, lives with his spouse and daughter, nonambulatory at baseline, has an Conservator, museum/gallery, hospital bed and supposed to get a Engineer, drilling lift soon via the [place], extensive PMH including iron deficiency anemia, lower extremity chronic venous insufficiency/varicose veins, history of DVT and uncertain if still on apixaban (awaiting home medications review by pharmacy), depression, GERD, HLD, HTN, schizoaffective disorder, sleep apnea but not on CPAP, chronic respiratory failure with hypoxia on nocturnal oxygen 2 L/min, rheumatoid arthritis, severe aortic stenosis, prior MSSA bacteremia and chronic left hip pain initially presented to [facility] ED on 11/25/2019 due to productive cough, dyspnea and oxygen his work-up significant for right lung pneumonia, for he which admitted for further management .    Subjective:    Shaun Yoder reports his dyspnea has improved, cough has improved as well, he does report couple bowel movements yesterday.   Assessment  & Plan :    Principal Problem:   CAP (community acquired pneumonia) Active Problems:   Aortic stenosis, severe   GERD (gastroesophageal reflux disease)   Hyperlipidemia   Hypertension   Varicose veins of lower extremities with ulcer (HCC)   Chronic left hip pain   Acute on chronic respiratory failure with hypoxia (HCC)  Lobar pneumonia  (right mid and lower lobe)/community-acquired pneumonia:  -Patient chest x-ray significant for right middle and lower lobe pneumonia. - patient reports coughing during his meals, SLP is consulted, BX was done today, moderate risk for aspiration, no change in diet recommendations, he was taught some techniques by SLP during swallowing. - some concern for aspiration pneumonia, he is on IV Unasyn  Acute on chronic respiratory failure with hypoxia:  - On  nocturnal home oxygen 2 L/min.  Reportedly hypoxic now even in the daytime with PT.  Likely due to lobar pneumonia.  Treat as above.  Oxygen support.  Wean as tolerated.  Incentive spirometry.  Thrombocytopenia: -  Appears to be chronic and intermittent looking at labs from 2019.  Follow CBC.  No bleeding reported.  Severe aortic stenosis:  -Surgical management in the past.  Hypotension -Appears to be chronic, and to symptomatic  Chronic left hip pain/nonambulatory state: Reportedly takes Percocet 5/325, continue.  Prolonged QTC:  -Have discussed with cardiology, Dr. Johnsie Cancel today, who reviewed his EKG, given his right bundle branch block, and fascicular block as well, it appears to be artificially elevated, more accurate reading for QTC will be around 440 .  history of DVT  - on apixaban   Schizoaffective disorder/bipolar disorder:  -Abilify and Depakote was resumed, Geodon has been held initially given prolonged QTC, I have discussed with his psychiatrist at the [facility] hospital regarding medication recommendation, and actually her recommendation is to start him on Latuda, as she was planning to stop his use on regardless, so recommendation is to continue with Latuda 40 mg every afternoon x48 hours, then increase dose to 40 mg twice daily after that, and Abilify can be stopped then .  GERD: Consider PPI if taking at home after med rec completed.  Rheumatoid arthritis: -   patient on chronic prednisone 5 mg daily. Increase to 10 mg oral  daily in the hospital stay due to acute illness.  COVID-19 Labs  No results for input(s): DDIMER, FERRITIN, LDH, CRP in the last 72 hours.  Lab Results  Component Value Date   [place] NEGATIVE 11/29/2019   [place] NEGATIVE 11/25/2019   [place] NEGATIVE 10/29/2019   [place] Not Detected 07/02/2019     Code Status : Full  Family Communication  : Discussed with wife at bedside 5/10, none at bedside today.  Disposition Plan  :  Status is: Inpatient  Remains inpatient appropriate because:Ongoing diagnostic testing needed not appropriate for outpatient work up   Dispo: The patient is from: Home              Anticipated d/c is to: SNF              Anticipated d/c date is: 1 day              Patient currently is not medically stable to d/c.       Consults  : Discussed with patient primary psychiatrist at [facility] Dr. Florian Buff [phone]  Procedures  :   DVT Prophylaxis  :  Eliquis  Lab Results  Component Value Date   PLT 153 11/29/2019    Antibiotics  :   Anti-infectives (From admission, onward)   Start     Dose/Rate Route Frequency Ordered Stop   11/27/19 1800  cefTRIAXone (ROCEPHIN) 2 g in sodium chloride 0.9 % 100 mL IVPB  Status:  Discontinued     2 g 200 mL/hr over 30 Minutes Intravenous Every 24 hours 11/27/19 0855 11/27/19 1250   11/27/19 1415  ampicillin-sulbactam (UNASYN) 1.5 g in sodium chloride 0.9 % 100 mL IVPB     1.5 g 200 mL/hr over 30 Minutes Intravenous Every 6 hours 11/27/19 1409     11/26/19 1430  doxycycline (VIBRA-TABS) tablet 100 mg  Status:  Discontinued     100 mg Oral Every 12 hours 11/26/19 1407 11/27/19 1250   11/25/19 1800  cefTRIAXone (ROCEPHIN) 1 g in sodium  chloride 0.9 % 100 mL IVPB  Status:  Discontinued     1 g 200 mL/hr over 30 Minutes Intravenous Every 24 hours 11/25/19 1746 11/27/19 0855   11/25/19 1800  azithromycin (ZITHROMAX) 500 mg in sodium chloride 0.9 % 250 mL IVPB  Status:  Discontinued      500 mg 250 mL/hr over 60 Minutes Intravenous Every 24 hours 11/25/19 1746 11/26/19 1407   11/25/19 1745  levofloxacin (LEVAQUIN) IVPB 750 mg  Status:  Discontinued     750 mg 100 mL/hr over 90 Minutes Intravenous  Once 11/25/19 1737 11/25/19 1746        Objective:   Vitals:   11/30/19 0902 11/30/19 0928 11/30/19 1251 11/30/19 1300  BP: (!) 78/53 (!) 88/52 (!) 83/65   Pulse: 64 (!) 59 75 65  Resp: 17 14 (!) 21 16  Temp: 98 F (36.7 C) 98 F (36.7 C) 98.1 F (36.7 C)   TempSrc: Oral Oral    SpO2: 96% 94% 93% (!) 88%  Weight:      Height:        Wt Readings from Last 3 Encounters:  11/30/19 76.5 kg  11/02/19 77.6 kg  10/29/19 77.6 kg     Intake/Output Summary (Last 24 hours) at 11/30/2019 1407 Last data filed at 11/30/2019 1252 Gross per 24 hour  Intake 1020 ml  Output 650 ml  Net 370 ml     Physical Exam  Awake Alert, Oriented X 3, No new F.N deficits, Normal affect Symmetrical Chest wall movement, Good air movement bilaterally, CTAB RRR,No Gallops,Rubs or new Murmurs, No Parasternal Heave +ve B.Sounds, Abd Soft, No tenderness, No rebound - guarding or rigidity. No Cyanosis, Chronic lower extremity skin changes e        Data Review:    CBC Recent Labs  Lab 11/25/19 1743 11/27/19 0519 11/28/19 0538 11/29/19 0231  WBC 11.2* 5.6 5.9 5.5  HGB 12.4* 10.8* 10.6* 11.3*  HCT 37.1* 33.8* 33.3* 34.8*  PLT 124* 128* 129* 153  MCV 95.4 96.3 95.7 94.1  MCH 31.9 30.8 30.5 30.5  MCHC 33.4 32.0 31.8 32.5  RDW 14.8 14.4 14.4 14.0  LYMPHSABS 0.9  --   --   --   MONOABS 0.9  --   --   --   EOSABS 0.0  --   --   --   BASOSABS 0.0  --   --   --     Chemistries  Recent Labs  Lab 11/25/19 1743 11/27/19 0519 11/28/19 0538 11/29/19 0231  NA 138 136 137 134*  K 4.4 4.2 4.1 4.1  CL 102 102 102 101  CO2 28 25 27 27   GLUCOSE 103* 85 91 100*  BUN 24* 17 15 13   CREATININE 0.46* 0.38* 0.51* 0.55*  CALCIUM 8.1* 8.1* 8.3* 8.0*  MG  --   --  1.6* 1.9    ------------------------------------------------------------------------------------------------------------------ No results for input(s): CHOL, HDL, LDLCALC, TRIG, CHOLHDL, LDLDIRECT in the last 72 hours.  Lab Results  Component Value Date   HGBA1C 4.9 03/01/2018   ------------------------------------------------------------------------------------------------------------------ No results for input(s): TSH, T4TOTAL, T3FREE, THYROIDAB in the last 72 hours.  Invalid input(s): FREET3 ------------------------------------------------------------------------------------------------------------------ No results for input(s): VITAMINB12, FOLATE, FERRITIN, TIBC, IRON, RETICCTPCT in the last 72 hours.  Coagulation profile Recent Labs  Lab 11/25/19 1743  INR 1.4*    No results for input(s): DDIMER in the last 72 hours.  Cardiac Enzymes No results for input(s): CKMB, TROPONINI, MYOGLOBIN in the last 168 hours.  Invalid input(s): CK ------------------------------------------------------------------------------------------------------------------    Component Value Date/Time   BNP 1,256.6 (H) 11/25/2019 1743    Inpatient Medications  Scheduled Meds: . apixaban  2.5 mg Oral BID  . ARIPiprazole  30 mg Oral QHS  . vitamin C  500 mg Oral Q breakfast  . aspirin EC  81 mg Oral Q breakfast  . divalproex  1,000 mg Oral QHS  . divalproex  250 mg Oral QPC lunch  . guaiFENesin  600 mg Oral BID  . [START ON 12/01/2019] lurasidone  40 mg Oral BID  . mirabegron ER  50 mg Oral Q breakfast  . multivitamin with minerals  1 tablet Oral Q breakfast  . oxybutynin  5 mg Oral TID WC  . pantoprazole  40 mg Oral Daily  . predniSONE  10 mg Oral Q breakfast  . senna  1 tablet Oral BID  . sodium chloride flush  3 mL Intravenous Q12H   Continuous Infusions: . sodium chloride    . ampicillin-sulbactam (UNASYN) IV 1.5 g (11/30/19 0912)   PRN Meds:.sodium chloride, acetaminophen **OR** acetaminophen,  albuterol, oxyCODONE-acetaminophen, polyethylene glycol, sodium chloride flush, triamcinolone cream  Micro Results Recent Results (from the past 240 hour(s))  Respiratory Panel by RT PCR (Flu A&B, Covid) - Nasopharyngeal Swab     Status: None   Collection Time: 11/25/19  5:43 PM   Specimen: Nasopharyngeal Swab  Result Value Ref Range Status   SARS Coronavirus 2 by RT PCR NEGATIVE NEGATIVE Final    Comment: (NOTE) SARS-CoV-2 target nucleic acids are NOT DETECTED. The SARS-CoV-2 RNA is generally detectable in upper respiratoy specimens during the acute phase of infection. The lowest concentration of SARS-CoV-2 viral copies this assay can detect is 131 copies/mL. A negative result does not preclude SARS-Cov-2 infection and should not be used as the sole basis for treatment or other patient management decisions. A negative result may occur with  improper specimen collection/handling, submission of specimen other than nasopharyngeal swab, presence of viral mutation(s) within the areas targeted by this assay, and inadequate number of viral copies (<131 copies/mL). A negative result must be combined with clinical observations, patient history, and epidemiological information. The expected result is Negative. Fact Sheet for Patients:  PinkCheek.be Fact Sheet for Healthcare Providers:  GravelBags.it This test is not yet ap proved or cleared by the [place] FDA and  has been authorized for detection and/or diagnosis of SARS-CoV-2 by FDA under an Emergency Use Authorization (EUA). This EUA will remain  in effect (meaning this test can be used) for the duration of the COVID-19 declaration under Section 564(b)(1) of the Act, 21 U.S.C. section 360bbb-3(b)(1), unless the authorization is terminated or revoked sooner.    Influenza A by PCR NEGATIVE NEGATIVE Final   Influenza B by PCR NEGATIVE NEGATIVE Final    Comment: (NOTE) The  Xpert Xpress SARS-CoV-2/FLU/RSV assay is intended as an aid in  the diagnosis of influenza from Nasopharyngeal swab specimens and  should not be used as a sole basis for treatment. Nasal washings and  aspirates are unacceptable for Xpert Xpress SARS-CoV-2/FLU/RSV  testing. Fact Sheet for Patients: PinkCheek.be Fact Sheet for Healthcare Providers: GravelBags.it This test is not yet approved or cleared by the [place] FDA and  has been authorized for detection and/or diagnosis of SARS-CoV-2 by  FDA under an Emergency Use Authorization (EUA). This EUA will remain  in effect (meaning this test can be used) for the duration of the  Covid-19 declaration under Section 564(b)(1) of the  Act, 21  U.S.C. section 360bbb-3(b)(1), unless the authorization is  terminated or revoked. Performed at [facility], [address] [place], [place] [address]   Blood Culture (routine x 2)     Status: None   Collection Time: 11/25/19  5:45 PM   Specimen: BLOOD RIGHT FOREARM  Result Value Ref Range Status   Specimen Description   Final    BLOOD RIGHT FOREARM Performed at [facility], [address] [place], [place] [address]    Special Requests   Final    BOTTLES DRAWN AEROBIC AND ANAEROBIC Blood Culture adequate volume Performed at [facility], [address] [place], [place] [address]    Culture   Final    NO GROWTH 5 DAYS Performed at [facility], [place] [address], [place], [place] [address]    Report Status 11/30/2019 FINAL  Final  Blood Culture (routine x 2)     Status: Abnormal (Preliminary result)   Collection Time: 11/25/19  6:00 PM   Specimen: BLOOD  Result Value Ref Range Status   Specimen Description   Final    BLOOD LEFT ANTECUBITAL Performed at [facility], [address] [place], [place] [address]    Special Requests   Final    BOTTLES DRAWN AEROBIC AND ANAEROBIC  Blood Culture adequate volume Performed at [facility], [address] [place], [place] [address]    Culture  Setup Time   Final    GRAM POSITIVE COCCI IN CLUSTERS ANAEROBIC BOTTLE ONLY CRITICAL RESULT CALLED TO, READ BACK BY AND VERIFIED WITH: Fayne Mediate, AT 1756 11/28/19 Performed at [facility], [place] [address] [place], [place] [address]    Culture ANAEROBIC GRAM POSITIVE COCCI (A)  Final   Report Status PENDING  Incomplete  Blood Culture ID Panel (Reflexed)     Status: None   Collection Time: 11/25/19  6:00 PM  Result Value Ref Range Status   Enterococcus species NOT DETECTED NOT DETECTED Final   Listeria monocytogenes NOT DETECTED NOT DETECTED Final   Staphylococcus species NOT DETECTED NOT DETECTED Final   Staphylococcus aureus (BCID) NOT DETECTED NOT DETECTED Final   Streptococcus species NOT DETECTED NOT DETECTED Final   Streptococcus agalactiae NOT DETECTED NOT DETECTED Final   Streptococcus pneumoniae NOT DETECTED NOT DETECTED Final   Streptococcus pyogenes NOT DETECTED NOT DETECTED Final   Acinetobacter baumannii NOT DETECTED NOT DETECTED Final   Enterobacteriaceae species NOT DETECTED NOT DETECTED Final   Enterobacter cloacae complex NOT DETECTED NOT DETECTED Final   Escherichia coli NOT DETECTED NOT DETECTED Final   Klebsiella oxytoca NOT DETECTED NOT DETECTED Final   Klebsiella pneumoniae NOT DETECTED NOT DETECTED Final   Proteus species NOT DETECTED NOT DETECTED Final   Serratia marcescens NOT DETECTED NOT DETECTED Final   Haemophilus influenzae NOT DETECTED NOT DETECTED Final   Neisseria meningitidis NOT DETECTED NOT DETECTED Final   Pseudomonas aeruginosa NOT DETECTED NOT DETECTED Final   Candida albicans NOT DETECTED NOT DETECTED Final   Candida glabrata NOT DETECTED NOT DETECTED Final   Candida krusei NOT DETECTED NOT DETECTED Final   Candida parapsilosis NOT DETECTED NOT DETECTED Final   Candida tropicalis NOT DETECTED NOT DETECTED  Final    Comment: Performed at [facility], [place] [address], [place], [place] [address]  Urine culture     Status: Abnormal   Collection Time: 11/25/19 10:31 PM  Specimen: In/Out Cath Urine  Result Value Ref Range Status   Specimen Description   Final    IN/OUT CATH URINE Performed at [facility], [address] [place], [place] [address]    Special Requests   Final    NONE Performed at [facility], [address] [place], [place] [address]    Culture (A)  Final    <10,000 COLONIES/mL INSIGNIFICANT GROWTH Performed at [place] [address], [place], [place] [address]    Report Status 11/27/2019 FINAL  Final  SARS CORONAVIRUS 2 (TAT 6-24 HRS) Nasopharyngeal Nasopharyngeal Swab     Status: None   Collection Time: 11/29/19  8:52 AM   Specimen: Nasopharyngeal Swab  Result Value Ref Range Status   SARS Coronavirus 2 NEGATIVE NEGATIVE Final    Comment: (NOTE) SARS-CoV-2 target nucleic acids are NOT DETECTED. The SARS-CoV-2 RNA is generally detectable in upper and lower respiratory specimens during the acute phase of infection. Negative results do not preclude SARS-CoV-2 infection, do not rule out co-infections with other pathogens, and should not be used as the sole basis for treatment or other patient management decisions. Negative results must be combined with clinical observations, patient history, and epidemiological information. The expected result is Negative. Fact Sheet for Patients: SugarRoll.be Fact Sheet for Healthcare Providers: https://www.woods-mathews.com/ This test is not yet approved or cleared by the [place] FDA and  has been authorized for detection and/or diagnosis of SARS-CoV-2 by FDA under an Emergency Use Authorization (EUA). This EUA will remain  in effect (meaning this test can be used) for the duration of the COVID-19 declaration under Section 56 4(b)(1) of the Act,  21 U.S.C. section 360bbb-3(b)(1), unless the authorization is terminated or revoked sooner. Performed at [facility], [place] [address] [place], [place] [address]     Radiology Reports DG Chest Portable 1 View  Result Date: 11/25/2019 CLINICAL DATA:  Shortness of breath. EXAM: PORTABLE CHEST 1 VIEW COMPARISON:  10/29/2019 FINDINGS: There is new consolidation involving the right mid and right lower lung zones. There is no pneumothorax. There is a small right-sided pleural effusion. There is some atelectasis versus scarring at the lung bases bilaterally. Chronic changes are noted of the bilateral humeral heads. The heart size is stable. IMPRESSION: 1. New consolidation in the right mid and lower lung zones consistent with pneumonia. 2. Small right-sided pleural effusion. Electronically Signed   By: Constance Holster M.D.   On: 11/25/2019 17:29   DG Swallowing Func-Speech Pathology  Result Date: 11/29/2019 Objective Swallowing Evaluation: Type of Study: MBS-Modified Barium Swallow Study  Patient Details Name: Shaun Yoder MRN: [id number] Date of Birth: 1945/11/25 Today's Date: 11/29/2019 Time: SLP Start Time (ACUTE ONLY): 1200 -SLP Stop Time (ACUTE ONLY): 1230 SLP Time Calculation (min) (ACUTE ONLY): 30 min Past Medical History: Past Medical History: Diagnosis Date . Anemia  . Cancer ([place])   bladder . Chronic venous insufficiency  . Colitis  . Colon polyps  . Depression  . Diverticulosis  . DVT of deep femoral vein ([place])  . Gastritis  . GERD (gastroesophageal reflux disease)  . History of rectal polyps  . Hyperlipidemia  . Hypertension  . Iron deficiency  . Low back pain  . Lymphedema of both lower extremities  . OA (osteoarthritis)  . 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 Schizoaffective disorder, bipolar type ([place])  . Sleep apnea  . Varicose vein  Past Surgical History: Past Surgical History: Procedure  Laterality Date . BLADDER SURGERY   . TOTAL KNEE ARTHROPLASTY Bilateral  . VARICOSE VEIN  SURGERY   HPI: Shaun Yoder is a 74 year old married male, lives with his spouse and daughter, nonambulatory at baseline, extensive PMH including iron deficiency anemia, lower extremity chronic venous insufficiency/varicose veins, history of DVT and uncertain if still on apixaban (awaiting home medications review by pharmacy), depression, GERD, HLD, HTN, schizoaffective disorder, sleep apnea but not on CPAP, chronic respiratory failure with hypoxia on nocturnal oxygen 2 L/min, rheumatoid arthritis, severe aortic stenosis, prior MSSA bacteremia and chronic left hip pain initially presented [facility] ED on 11/25/2019 due to productive cough, dyspnea and oxygen saturations in the low 80s at home as per home health PT. Diagnosed with right mid and lower lobe PNA.  Reports coughing with pos. Barium swallow complete in 2016 indicated significant barium retention in the vallecula, moderate esophageal dysmotility with suggested chronic reflux. No aspiration or penetration. [facility]BS [facility] no report available however this SLP did view study and patient did not appear to aspiration, mild residuals noted post swallow along base of tongue, vallecula, UES.  No data recorded Assessment / Plan / Recommendation CHL IP CLINICAL IMPRESSIONS 11/29/2019 Clinical Impression Pt demonstrates a mild pharyngeal dysphagia with silent frank penetration and trace aspiration events. Deficits are due to appearance of mild weakness, including decreased strangth of laryngeal clsoure during the swallow and also mild base of tongue weakness with entrapment of liquids and pooling in the sinuses post swallow. Attempted a variety of strategies, best method during this study was following sips with a throat clear and second swallow. Esophageal sweep unrevealing except when pill did lodge mid esophagus, cleared with a second swallow. Given that pt reports an oral infection about a month prior to this hospitalization,  improving oral care with denture removal and cleaning three times a day and overnight may improve oral bacterial load. Discussed with pt. Will f/u for education.  SLP Visit Diagnosis Dysphagia, oropharyngeal phase (R13.12) Attention and concentration deficit following -- Frontal lobe and executive function deficit following -- Impact on safety and function --   CHL IP TREATMENT RECOMMENDATION 11/29/2019 Treatment Recommendations Therapy as outlined in treatment plan below   No flowsheet data found. CHL IP DIET RECOMMENDATION 11/29/2019 SLP Diet Recommendations Regular solids;Thin liquid Liquid Administration via Cup;Straw Medication Administration Whole meds with liquid Compensations Slow rate;Small sips/bites;Clear throat after each swallow;Multiple dry swallows after each bite/sip Postural Changes Remain semi-upright after after feeds/meals (Comment)   CHL IP OTHER RECOMMENDATIONS 11/29/2019 Recommended Consults -- Oral Care Recommendations Oral care before and after PO Other Recommendations --   CHL IP FOLLOW UP RECOMMENDATIONS 11/29/2019 Follow up Recommendations Skilled Nursing facility   [facility] IP FREQUENCY AND DURATION 11/29/2019 Speech Therapy Frequency (ACUTE ONLY) min 2x/week Treatment Duration 2 weeks      CHL IP ORAL PHASE 11/29/2019 Oral Phase WFL Oral - Pudding Teaspoon -- Oral - Pudding Cup -- Oral - Honey Teaspoon -- Oral - Honey Cup -- Oral - Nectar Teaspoon -- Oral - Nectar Cup -- Oral - Nectar Straw -- Oral - Thin Teaspoon -- Oral - Thin Cup -- Oral - Thin Straw -- Oral - Puree -- Oral - Mech Soft -- Oral - Regular -- Oral - Multi-Consistency -- Oral - Pill -- Oral Phase - Comment --  CHL IP PHARYNGEAL PHASE 11/29/2019 Pharyngeal Phase Impaired Pharyngeal- Pudding Teaspoon -- Pharyngeal -- Pharyngeal- Pudding Cup -- Pharyngeal -- Pharyngeal- Honey Teaspoon -- Pharyngeal --  Pharyngeal- Honey Cup -- Pharyngeal -- Pharyngeal- Nectar Teaspoon -- Pharyngeal -- Pharyngeal- Nectar Cup -- Pharyngeal -- Pharyngeal-  Nectar Straw -- Pharyngeal -- Pharyngeal- Thin Teaspoon -- Pharyngeal -- Pharyngeal- Thin Cup Reduced airway/laryngeal closure;Reduced tongue base retraction;Penetration/Aspiration during swallow;Penetration/Apiration after swallow;Trace aspiration;Pharyngeal residue - valleculae;Pharyngeal residue - pyriform;Compensatory strategies attempted (with notebox) Pharyngeal Material enters airway, passes BELOW cords without attempt by patient to eject out (silent aspiration);Material enters airway, CONTACTS cords and not ejected out;Material does not enter airway Pharyngeal- Thin Straw Reduced airway/laryngeal closure;Reduced tongue base retraction;Penetration/Aspiration during swallow;Penetration/Apiration after swallow;Trace aspiration;Pharyngeal residue - valleculae;Pharyngeal residue - pyriform;Compensatory strategies attempted (with notebox) Pharyngeal Material enters airway, CONTACTS cords and not ejected out;Material enters airway, passes BELOW cords without attempt by patient to eject out (silent aspiration) Pharyngeal- Puree Pharyngeal residue - valleculae;Pharyngeal residue - pyriform Pharyngeal -- Pharyngeal- Mechanical Soft -- Pharyngeal -- Pharyngeal- Regular Pharyngeal residue - valleculae;Pharyngeal residue - pyriform Pharyngeal -- Pharyngeal- Multi-consistency -- Pharyngeal -- Pharyngeal- Pill -- Pharyngeal -- Pharyngeal Comment --  CHL IP CERVICAL ESOPHAGEAL PHASE 11/29/2019 Cervical Esophageal Phase WFL Pudding Teaspoon -- Pudding Cup -- Honey Teaspoon -- Honey Cup -- Nectar Teaspoon -- Nectar Cup -- Nectar Straw -- Thin Teaspoon -- Thin Cup -- Thin Straw -- Puree -- Mechanical Soft -- Regular -- Multi-consistency -- Pill -- Cervical Esophageal Comment -- Herbie Baltimore, MA CCC-SLP Acute Rehabilitation Services Pager [phone] Office [phone] Lynann Beaver 11/29/2019, 1:25 PM                Phillips Climes M.D on 11/30/2019 at 2:07 PM  Between 7am to 7pm - Pager -  [phone]  After 7pm go to [email or web address] - password [facility]  [facility] -  Office  [phone]

## 2019-11-30 NOTE — Progress Notes (Signed)
Physical Therapy Treatment Patient Details Name: Shaun Yoder MRN: [id number] DOB: 1946/07/15 Today's Date: 11/30/2019    History of Present Illness Shaun Yoder is a 74 year old married male, nonambulatory at baseline, PMH: iron deficiency anemia, lower extremity chronic venous insufficiency/varicose veins, history of DVT, depression, GERD, HLD, HTN, schizoaffective disorder, sleep apnea but not on CPAP, chronic respiratory failure with hypoxia on nocturnal oxygen 2 L/min, rheumatoid arthritis, severe aortic stenosis, prior MSSA bacteremia and chronic left hip pain initially presented to [facility] ED on 11/25/2019 due to productive cough, dyspnea and oxygen saturations in the low 80s. Dx with CAP.    PT Comments    Pt required mod assist rolling R/L using rails, +2 total assist supine <> sit, and +2 total assist lateral scooting along EOB. He demonstrated fair sitting balance. Able to sit x 5 minutes with BUE/LE support, supervision. Very flexed posture noted in supine and sit with R lateral lean.  Pt returned to supine at end of session, with head of bed elevated to eat lunch.     Follow Up Recommendations  SNF;Supervision/Assistance - 24 hour     Equipment Recommendations  None recommended by PT    Recommendations for Other Services       Precautions / Restrictions Precautions Precautions: Fall    Mobility  Bed Mobility Overal bed mobility: Needs Assistance Bed Mobility: Rolling;Supine to Sit;Sit to Supine Rolling: Mod assist   Supine to sit: Total assist;+2 for physical assistance Sit to supine: Total assist;+2 for physical assistance   General bed mobility comments: rolling R/L using rails for hygiene due to BM in bed on arrival. Helicopter method using bed pad for transition to EOB.  Transfers Overall transfer level: Needs assistance Equipment used: None Transfers: Lateral/Scoot Transfers          Lateral/Scoot Transfers: Total assist;+2 physical  assistance General transfer comment: lateral scooting toward right sitting EOB, assisted using bed pad  Ambulation/Gait             General Gait Details: unable at baseline (pt uses power w/c)   Stairs             Wheelchair Mobility    Modified Rankin (Stroke Patients Only)       Balance Overall balance assessment: Needs assistance Sitting-balance support: Feet supported;Bilateral upper extremity supported Sitting balance-Leahy Scale: Fair Sitting balance - Comments: supervision sitting EOB with BUE/LE support                                    Cognition Arousal/Alertness: Awake/alert Behavior During Therapy: WFL for tasks assessed/performed Overall Cognitive Status: Within Functional Limits for tasks assessed                                 General Comments: pt very verbose with some tengential conversation      Exercises      General Comments        Pertinent Vitals/Pain Pain Assessment: Faces Faces Pain Scale: Hurts even more Pain Location: generalized, especially L hip Pain Descriptors / Indicators: Grimacing;Guarding;Discomfort;Sore Pain Intervention(s): Monitored during session;Limited activity within patient's tolerance;Repositioned    Home Living                      Prior Function            PT Goals (current  goals can now be found in the care plan section) Acute Rehab PT Goals Patient Stated Goal: hopeful for home after ST SNF/therapy Progress towards PT goals: Progressing toward goals    Frequency    Min 2X/week      PT Plan Current plan remains appropriate    Co-evaluation              AM-PAC PT "6 Clicks" Mobility   Outcome Measure  Help needed turning from your back to your side while in a flat bed without using bedrails?: A Lot Help needed moving from lying on your back to sitting on the side of a flat bed without using bedrails?: Total Help needed moving to and from a bed  to a chair (including a wheelchair)?: Total Help needed standing up from a chair using your arms (e.g., wheelchair or bedside chair)?: Total Help needed to walk in hospital room?: Total Help needed climbing 3-5 steps with a railing? : Total 6 Click Score: 7    End of Session   Activity Tolerance: Patient tolerated treatment well Patient left: in bed;with call bell/phone within reach;with bed alarm set Nurse Communication: Mobility status PT Visit Diagnosis: Muscle weakness (generalized) (M62.81);Pain;Adult, failure to thrive (R62.7) Pain - Right/Left: Left Pain - part of body: Hip     Time: [id number] PT Time Calculation (min) (ACUTE ONLY): 26 min  Charges:  $Therapeutic Activity: 23-37 mins                     Lorrin Goodell, PT  Office # [phone] Pager [phone]    Lorriane Shire 11/30/2019, 1:38 PM

## 2019-11-30 NOTE — Progress Notes (Signed)
Pharmacy Antibiotic Note  Shaun Yoder is a 74 y.o. male admitted on 11/25/2019 with pneumonia.  Pharmacy has been consulted for Unasyn dosing.  Cephalosporin allergy of rash, but tolerated Cephs back in 2019 and several doses of penicillins.  Renal function remains stable - will continue current dose.   Plan: Unasyn 1.5 gm IV q6hr Monitor renal function, clinical status and C&S  Height: 5\' 1"  (154.9 cm) Weight: 76.5 kg (168 lb 10.4 oz) IBW/kg (Calculated) : 52.3  Temp (24hrs), Avg:98.4 F (36.9 C), Min:98 F (36.7 C), Max:98.7 F (37.1 C)  Recent Labs  Lab 11/25/19 1743 11/25/19 1756 11/27/19 0519 11/28/19 0538 11/29/19 0231  WBC 11.2*  --  5.6 5.9 5.5  CREATININE 0.46*  --  0.38* 0.51* 0.55*  LATICACIDVEN  --  1.3  --   --   --     Estimated Creatinine Clearance: 72.1 mL/min (A) (by C-G formula based on SCr of 0.55 mg/dL (L)).    Allergies  Allergen Reactions  . Leflunomide Diarrhea  . Morphine And Related Nausea And Vomiting  . Ancef [Cefazolin] Rash    Rash   . Plaquenil [Hydroxychloroquine Sulfate] Rash    Azithro 5/6 x 1 (QTc long) CTX 5/6>>5/8 Doxy PO 5/7>>5/8 Unasyn 5/8 >>   5/6 Bcx: 1 of 4 with GPC clusters, nothing on BICD 5/6 Ucx: insignificant growth  5/6 COVID: negative  Thank you for allowing pharmacy to be a part of this patient's care.  Alycia Rossetti, PharmD, BCPS Clinical Pharmacist Clinical phone for 11/30/2019: [phone] 11/30/2019 11:44 AM   **Pharmacist phone directory can now be found on [email or web address] ([email or web address]).  Listed under [place].

## 2019-12-01 DIAGNOSIS — M069 Rheumatoid arthritis, unspecified: Secondary | ICD-10-CM | POA: Diagnosis present

## 2019-12-01 DIAGNOSIS — J9620 Acute and chronic respiratory failure, unspecified whether with hypoxia or hypercapnia: Secondary | ICD-10-CM | POA: Diagnosis present

## 2019-12-01 DIAGNOSIS — R5381 Other malaise: Secondary | ICD-10-CM | POA: Diagnosis not present

## 2019-12-01 DIAGNOSIS — Z7401 Bed confinement status: Secondary | ICD-10-CM | POA: Diagnosis not present

## 2019-12-01 DIAGNOSIS — R278 Other lack of coordination: Secondary | ICD-10-CM | POA: Diagnosis present

## 2019-12-01 DIAGNOSIS — M2550 Pain in unspecified joint: Secondary | ICD-10-CM | POA: Diagnosis not present

## 2019-12-01 DIAGNOSIS — J189 Pneumonia, unspecified organism: Secondary | ICD-10-CM | POA: Diagnosis not present

## 2019-12-01 DIAGNOSIS — M6281 Muscle weakness (generalized): Secondary | ICD-10-CM | POA: Diagnosis present

## 2019-12-01 DIAGNOSIS — F29 Unspecified psychosis not due to a substance or known physiological condition: Secondary | ICD-10-CM | POA: Diagnosis not present

## 2019-12-01 DIAGNOSIS — J1520 Pneumonia due to staphylococcus, unspecified: Secondary | ICD-10-CM | POA: Diagnosis present

## 2019-12-01 DIAGNOSIS — I739 Peripheral vascular disease, unspecified: Secondary | ICD-10-CM | POA: Diagnosis not present

## 2019-12-01 DIAGNOSIS — R1312 Dysphagia, oropharyngeal phase: Secondary | ICD-10-CM | POA: Diagnosis present

## 2019-12-01 DIAGNOSIS — M6259 Muscle wasting and atrophy, not elsewhere classified, multiple sites: Secondary | ICD-10-CM | POA: Diagnosis present

## 2019-12-01 DIAGNOSIS — L89626 Pressure-induced deep tissue damage of left heel: Secondary | ICD-10-CM | POA: Diagnosis not present

## 2019-12-01 DIAGNOSIS — M25552 Pain in left hip: Secondary | ICD-10-CM | POA: Diagnosis present

## 2019-12-01 DIAGNOSIS — J9621 Acute and chronic respiratory failure with hypoxia: Secondary | ICD-10-CM | POA: Diagnosis not present

## 2019-12-01 DIAGNOSIS — L739 Follicular disorder, unspecified: Secondary | ICD-10-CM | POA: Diagnosis not present

## 2019-12-01 DIAGNOSIS — E669 Obesity, unspecified: Secondary | ICD-10-CM | POA: Diagnosis present

## 2019-12-01 DIAGNOSIS — M419 Scoliosis, unspecified: Secondary | ICD-10-CM | POA: Diagnosis not present

## 2019-12-01 DIAGNOSIS — F250 Schizoaffective disorder, bipolar type: Secondary | ICD-10-CM | POA: Diagnosis present

## 2019-12-01 DIAGNOSIS — I350 Nonrheumatic aortic (valve) stenosis: Secondary | ICD-10-CM | POA: Diagnosis not present

## 2019-12-01 LAB — CULTURE, BLOOD (ROUTINE X 2): Special Requests: ADEQUATE

## 2019-12-01 MED ORDER — AMOXICILLIN-POT CLAVULANATE 875-125 MG PO TABS: 1.0000 | ORAL_TABLET | Freq: Two times a day (BID) | ORAL | 0 refills | Status: AC

## 2019-12-01 MED ORDER — POLYETHYLENE GLYCOL 3350 17 G PO PACK: 17.0000 g | PACK | Freq: Every day | ORAL | 0 refills | Status: AC

## 2019-12-01 MED ORDER — LURASIDONE HCL 40 MG PO TABS: 40.0000 mg | ORAL_TABLET | Freq: Two times a day (BID) | ORAL | Status: AC

## 2019-12-01 MED ORDER — GUAIFENESIN ER 600 MG PO TB12: 600.0000 mg | ORAL_TABLET | Freq: Two times a day (BID) | ORAL | Status: AC

## 2019-12-01 MED ORDER — OXYCODONE-ACETAMINOPHEN 5-325 MG PO TABS: 1.0000 | ORAL_TABLET | Freq: Four times a day (QID) | ORAL | 0 refills | Status: AC | PRN

## 2019-12-01 NOTE — Discharge Summary (Addendum)
PATIENT DETAILS Name: Shaun Yoder Age: 74 y.o. Sex: male Date of Birth: 1946/01/23 MRN: [id number]. Admitting Physician: Modena Jansky, MD PCP:Paz, Alda Berthold, MD  Admit Date: 11/25/2019 Discharge date: 12/01/2019  Recommendations for Outpatient Follow-up:  1. Follow up with PCP in 1-2 weeks 2. Please obtain CMP/CBC in one week 3. Please repeat two-view chest x-ray in 4 to 6 weeks to ensure resolution of pneumonia  Admitted From:  Home  Disposition: SNF   Home Health: No  Equipment/Devices: None  Discharge Condition: Stable  CODE STATUS: FULL CODE  Diet recommendation:  Diet Order            Diet - low sodium heart healthy        Diet Heart Room service appropriate? Yes; Fluid consistency: Thin  Diet effective now               Brief Summary: See H&P, Labs, Consult and Test reports for all details in brief, patient is a 74 year old male with history of scoliosis, chronic debility-bed to wheelchair bound, lower extremity chronic venous insufficiency, DVT chronic anticoagulation, bipolar disorder, chronic hypoxic respiratory failure on home O2-presented with, shortness of breath-found to have pneumonia.  See below for further details.  Brief Hospital Course: Right lobar pneumonia: Suspicion for aspiration pneumonia-treated with IV Unasyn-with plans to transition to Augmentin on discharge.  SLP consulted during this hospital stay-recommendations are to continue a regular diet on discharge.  1 out of 2 blood cultures is positive for Staphylococcus saccharolyticus (coag negative)-likely a contaminant.  Acute on chronic hypoxic respiratory failure: Worsening hypoxemia secondary to pneumonia-on chronic home O2-2 L at home.  He is currently much better-at times on room air but requiring either 1-2 L of oxygen.  Thrombocytopenia: Appears to be mild-chronic issue-stable for outpatient follow-up with PCP.  Hx of Severe aortic stenosis: Follows with cardiology at  [facility].  Ensure outpatient follow-up with his primary cardiologist.  Chronic hypotension: Asymptomatic-supportive care.  History of DVT: Continue Eliquis  RA: Continue steroids on discharge  History of schizoaffective disorder/bipolar disorder: Prior MD spoke to his psychiatrist at the [facility] and Depakote.  No longer on Abilify.  History of scoliosis/chronic debility-bed to wheelchair bound  Obesity: Estimated body mass index is 31.87 kg/m as calculated from the following:   Height as of this encounter: 5\' 1"  (1.549 m).   Weight as of this encounter: 76.5 kg.    Discharge Diagnoses:  Principal Problem:   CAP (community acquired pneumonia) Active Problems:   Aortic stenosis, severe   GERD (gastroesophageal reflux disease)   Hyperlipidemia   Hypertension   Varicose veins of lower extremities with ulcer (HCC)   Chronic left hip pain   Acute on chronic respiratory failure with hypoxia [facility])   Discharge Instructions:  Activity:  As tolerated with Full fall precautions use walker/cane & assistance as needed Discharge Instructions    Diet - low sodium heart healthy   Complete by: As directed    Increase activity slowly   Complete by: As directed      Allergies as of 12/01/2019      Reactions   Leflunomide Diarrhea   Morphine And Related Nausea And Vomiting   Ancef [cefazolin] Rash   Rash   Plaquenil [hydroxychloroquine Sulfate] Rash      Medication List    STOP taking these medications   ARIPiprazole 30 MG tablet Commonly known as: ABILIFY   augmented betamethasone dipropionate 0.05 % cream Commonly known as:  DIPROLENE-AF   ziprasidone 20 MG capsule Commonly known as: GEODON   ziprasidone 80 MG capsule Commonly known as: GEODON     TAKE these medications   albuterol 108 (90 Base) MCG/ACT inhaler Commonly known as: VENTOLIN HFA Inhale 2 puffs into the lungs every 6 (six) hours as needed for wheezing or shortness of  breath.   albuterol (2.5 MG/3ML) 0.083% nebulizer solution Commonly known as: PROVENTIL Take 3 mLs (2.5 mg total) by nebulization 4 (four) times daily as needed for wheezing or shortness of breath.   amoxicillin-clavulanate 875-125 MG tablet Commonly known as: Augmentin Take 1 tablet by mouth 2 (two) times daily for 4 days.   aspirin EC 81 MG tablet Take 81 mg by mouth daily with breakfast.   CALCIUM/D3 ADULT GUMMIES PO Take 1 tablet by mouth 2 (two) times daily with a meal.   divalproex 250 MG DR tablet Commonly known as: DEPAKOTE Take 250 mg by mouth daily after lunch.   divalproex 500 MG 24 hr tablet Commonly known as: DEPAKOTE ER Take 1,000 mg by mouth at bedtime.   Eliquis 5 MG Tabs tablet Generic drug: apixaban Take 2.5 mg by mouth 2 (two) times daily.   Fish Oil 1200 MG Caps Take 1,200 mg by mouth daily with breakfast.   guaiFENesin 600 MG 12 hr tablet Commonly known as: MUCINEX Take 1 tablet (600 mg total) by mouth 2 (two) times daily.   Humira 40 MG/0.4ML Pskt Generic drug: Adalimumab Inject 40 mg into the skin every 14 (fourteen) days. Every other Sunday   ibuprofen 200 MG tablet Commonly known as: ADVIL Take 400 mg by mouth every 6 (six) hours as needed for headache.   ketoconazole 2 % shampoo Commonly known as: NIZORAL Apply 1 application topically every Monday, Wednesday, and Friday.   ketoconazole 2 % cream Commonly known as: NIZORAL Apply 1 application topically daily.   lurasidone 40 MG Tabs tablet Commonly known as: LATUDA Take 1 tablet (40 mg total) by mouth 2 (two) times daily.   melatonin 3 MG Tabs tablet Take 3 mg by mouth at bedtime as needed (sleep).   mirabegron ER 50 MG Tb24 tablet Commonly known as: MYRBETRIQ Take 50 mg by mouth daily with breakfast.   multivitamin with minerals Tabs tablet Take 1 tablet by mouth daily with breakfast.   omeprazole 20 MG capsule Commonly known as: PRILOSEC Take 20 mg by mouth daily with  breakfast.   oxybutynin 5 MG tablet Commonly known as: DITROPAN Take 5 mg by mouth 3 (three) times daily with meals.   oxyCODONE-acetaminophen 5-325 MG tablet Commonly known as: PERCOCET/ROXICET Take 1 tablet by mouth every 6 (six) hours as needed for moderate pain. What changed: Another medication with the same name was removed. Continue taking this medication, and follow the directions you see here.   polyethylene glycol 17 g packet Commonly known as: MIRALAX / GLYCOLAX Take 17 g by mouth daily.   predniSONE 5 MG tablet Commonly known as: DELTASONE Take 5 mg by mouth daily with breakfast.   SYSTANE OP Place 1 drop into both eyes 2 (two) times daily as needed (dry eyes).   triamcinolone cream 0.1 % Commonly known as: KENALOG Apply 1 application topically 2 (two) times daily as needed (scaly places on knees).   vitamin C 500 MG tablet Commonly known as: ASCORBIC ACID Take 500 mg by mouth daily with breakfast.       Allergies  Allergen Reactions  . Leflunomide Diarrhea  . Morphine And  Related Nausea And Vomiting  . Ancef [Cefazolin] Rash    Rash   . Plaquenil [Hydroxychloroquine Sulfate] Rash    Consultations:   None  Other Procedures/Studies: DG Chest Portable 1 View  Result Date: 11/25/2019 CLINICAL DATA:  Shortness of breath. EXAM: PORTABLE CHEST 1 VIEW COMPARISON:  10/29/2019 FINDINGS: There is new consolidation involving the right mid and right lower lung zones. There is no pneumothorax. There is a small right-sided pleural effusion. There is some atelectasis versus scarring at the lung bases bilaterally. Chronic changes are noted of the bilateral humeral heads. The heart size is stable. IMPRESSION: 1. New consolidation in the right mid and lower lung zones consistent with pneumonia. 2. Small right-sided pleural effusion. Electronically Signed   By: Constance Holster M.D.   On: 11/25/2019 17:29   DG Swallowing Func-Speech Pathology  Result Date:  11/29/2019 Objective Swallowing Evaluation: Type of Study: MBS-Modified Barium Swallow Study  Patient Details Name: WESTER TERREL MRN: [id number] Date of Birth: 08/23/1945 Today's Date: 11/29/2019 Time: SLP Start Time (ACUTE ONLY): 1200 -SLP Stop Time (ACUTE ONLY): 1230 SLP Time Calculation (min) (ACUTE ONLY): 30 min Past Medical History: Past Medical History: Diagnosis Date . Anemia  . Cancer ([place])   bladder . Chronic venous insufficiency  . Colitis  . Colon polyps  . Depression  . Diverticulosis  . DVT of deep femoral vein ([place])  . Gastritis  . GERD (gastroesophageal reflux disease)  . History of rectal polyps  . Hyperlipidemia  . Hypertension  . Iron deficiency  . Low back pain  . Lymphedema of both lower extremities  . OA (osteoarthritis)  . 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 Schizoaffective disorder, bipolar type ([place])  . Sleep apnea  . Varicose vein  Past Surgical History: Past Surgical History: Procedure Laterality Date . BLADDER SURGERY   . TOTAL KNEE ARTHROPLASTY Bilateral  . VARICOSE VEIN SURGERY   HPI: LIBORIO CRAYCRAFT is a 74 year old married male, lives with his spouse and daughter, nonambulatory at baseline, extensive PMH including iron deficiency anemia, lower extremity chronic venous insufficiency/varicose veins, history of DVT and uncertain if still on apixaban (awaiting home medications review by pharmacy), depression, GERD, HLD, HTN, schizoaffective disorder, sleep apnea but not on CPAP, chronic respiratory failure with hypoxia on nocturnal oxygen 2 L/min, rheumatoid arthritis, severe aortic stenosis, prior MSSA bacteremia and chronic left hip pain initially presented to [facility] ED on 11/25/2019 due to productive cough, dyspnea and oxygen saturations in the low 80s at home as per home health PT. Diagnosed with right mid and lower lobe PNA.  Reports coughing with pos. Barium swallow complete in 2016 indicated significant barium retention in the vallecula, moderate esophageal dysmotility with  suggested chronic reflux. No aspiration or penetration. [facility] no report available however this SLP did view study and patient did not appear to aspiration, mild residuals noted post swallow along base of tongue, vallecula, UES.  No data recorded Assessment / Plan / Recommendation CHL IP CLINICAL IMPRESSIONS 11/29/2019 Clinical Impression Pt demonstrates a mild pharyngeal dysphagia with silent frank penetration and trace aspiration events. Deficits are due to appearance of mild weakness, including decreased strangth of laryngeal clsoure during the swallow and also mild base of tongue weakness with entrapment of liquids and pooling in the sinuses post swallow. Attempted a variety of strategies, best method during this study was following sips with a throat clear and second swallow. Esophageal sweep unrevealing except when pill did lodge mid esophagus,  cleared with a second swallow. Given that pt reports an oral infection about a month prior to this hospitalization, improving oral care with denture removal and cleaning three times a day and overnight may improve oral bacterial load. Discussed with pt. Will f/u for education.  SLP Visit Diagnosis Dysphagia, oropharyngeal phase (R13.12) Attention and concentration deficit following -- Frontal lobe and executive function deficit following -- Impact on safety and function --   CHL IP TREATMENT RECOMMENDATION 11/29/2019 Treatment Recommendations Therapy as outlined in treatment plan below   No flowsheet data found. CHL IP DIET RECOMMENDATION 11/29/2019 SLP Diet Recommendations Regular solids;Thin liquid Liquid Administration via Cup;Straw Medication Administration Whole meds with liquid Compensations Slow rate;Small sips/bites;Clear throat after each swallow;Multiple dry swallows after each bite/sip Postural Changes Remain semi-upright after after feeds/meals (Comment)   CHL IP OTHER RECOMMENDATIONS 11/29/2019 Recommended Consults  -- Oral Care Recommendations Oral care before and after PO Other Recommendations --   CHL IP FOLLOW UP RECOMMENDATIONS 11/29/2019 Follow up Recommendations Skilled Nursing facility   [facility] IP FREQUENCY AND DURATION 11/29/2019 Speech Therapy Frequency (ACUTE ONLY) min 2x/week Treatment Duration 2 weeks      CHL IP ORAL PHASE 11/29/2019 Oral Phase WFL Oral - Pudding Teaspoon -- Oral - Pudding Cup -- Oral - Honey Teaspoon -- Oral - Honey Cup -- Oral - Nectar Teaspoon -- Oral - Nectar Cup -- Oral - Nectar Straw -- Oral - Thin Teaspoon -- Oral - Thin Cup -- Oral - Thin Straw -- Oral - Puree -- Oral - Mech Soft -- Oral - Regular -- Oral - Multi-Consistency -- Oral - Pill -- Oral Phase - Comment --  CHL IP PHARYNGEAL PHASE 11/29/2019 Pharyngeal Phase Impaired Pharyngeal- Pudding Teaspoon -- Pharyngeal -- Pharyngeal- Pudding Cup -- Pharyngeal -- Pharyngeal- Honey Teaspoon -- Pharyngeal -- Pharyngeal- Honey Cup -- Pharyngeal -- Pharyngeal- Nectar Teaspoon -- Pharyngeal -- Pharyngeal- Nectar Cup -- Pharyngeal -- Pharyngeal- Nectar Straw -- Pharyngeal -- Pharyngeal- Thin Teaspoon -- Pharyngeal -- Pharyngeal- Thin Cup Reduced airway/laryngeal closure;Reduced tongue base retraction;Penetration/Aspiration during swallow;Penetration/Apiration after swallow;Trace aspiration;Pharyngeal residue - valleculae;Pharyngeal residue - pyriform;Compensatory strategies attempted (with notebox) Pharyngeal Material enters airway, passes BELOW cords without attempt by patient to eject out (silent aspiration);Material enters airway, CONTACTS cords and not ejected out;Material does not enter airway Pharyngeal- Thin Straw Reduced airway/laryngeal closure;Reduced tongue base retraction;Penetration/Aspiration during swallow;Penetration/Apiration after swallow;Trace aspiration;Pharyngeal residue - valleculae;Pharyngeal residue - pyriform;Compensatory strategies attempted (with notebox) Pharyngeal Material enters airway, CONTACTS cords and not ejected  out;Material enters airway, passes BELOW cords without attempt by patient to eject out (silent aspiration) Pharyngeal- Puree Pharyngeal residue - valleculae;Pharyngeal residue - pyriform Pharyngeal -- Pharyngeal- Mechanical Soft -- Pharyngeal -- Pharyngeal- Regular Pharyngeal residue - valleculae;Pharyngeal residue - pyriform Pharyngeal -- Pharyngeal- Multi-consistency -- Pharyngeal -- Pharyngeal- Pill -- Pharyngeal -- Pharyngeal Comment --  CHL IP CERVICAL ESOPHAGEAL PHASE 11/29/2019 Cervical Esophageal Phase WFL Pudding Teaspoon -- Pudding Cup -- Honey Teaspoon -- Honey Cup -- Nectar Teaspoon -- Nectar Cup -- Nectar Straw -- Thin Teaspoon -- Thin Cup -- Thin Straw -- Puree -- Mechanical Soft -- Regular -- Multi-consistency -- Pill -- Cervical Esophageal Comment -- Herbie Baltimore, MA CCC-SLP Acute Rehabilitation Services Pager [phone] Office [phone] [place], Katherene Ponto 11/29/2019, 1:25 PM                TODAY-DAY OF DISCHARGE:  Subjective:   [name] today has no headache,no chest abdominal pain,no new weakness tingling or numbness, feels much better wants to go home today.  Objective:   Blood pressure (!) 86/52, pulse (!) 59, temperature 98.3 F (36.8 C), temperature source Oral, resp. rate 18, height 5\' 1"  (1.549 m), weight 76.5 kg, SpO2 91 %.  Intake/Output Summary (Last 24 hours) at 12/01/2019 1100 Last data filed at 12/01/2019 0352 Gross per 24 hour  Intake 1040 ml  Output 1225 ml  Net -185 ml   Filed Weights   11/29/19 0501 11/30/19 0645 12/01/19 0605  Weight: 77.8 kg 76.5 kg 76.5 kg    Exam: Awake Alert, Oriented *3, No new F.N deficits, Normal affect [place].AT,PERRAL Supple Neck,No JVD, No cervical lymphadenopathy appriciated.  Symmetrical Chest wall movement, Good air movement bilaterally, CTAB RRR,No Gallops,Rubs or new Murmurs, No Parasternal Heave +ve B.Sounds, Abd Soft, Non tender, No organomegaly appriciated, No rebound -guarding or rigidity. No  Cyanosis, Clubbing or edema, No new Rash or bruise   PERTINENT RADIOLOGIC STUDIES: DG Swallowing Func-Speech Pathology  Result Date: 11/29/2019 Objective Swallowing Evaluation: Type of Study: MBS-Modified Barium Swallow Study  Patient Details Name: TSHAWN VOLBRECHT MRN: [id number] Date of Birth: October 19, 1945 Today's Date: 11/29/2019 Time: SLP Start Time (ACUTE ONLY): 1200 -SLP Stop Time (ACUTE ONLY): 1230 SLP Time Calculation (min) (ACUTE ONLY): 30 min Past Medical History: Past Medical History: Diagnosis Date . Anemia  . Cancer ([place])   bladder . Chronic venous insufficiency  . Colitis  . Colon polyps  . Depression  . Diverticulosis  . DVT of deep femoral vein ([place])  . Gastritis  . GERD (gastroesophageal reflux disease)  . History of rectal polyps  . Hyperlipidemia  . Hypertension  . Iron deficiency  . Low back pain  . Lymphedema of both lower extremities  . OA (osteoarthritis)  . 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 Schizoaffective disorder, bipolar type ([place])  . Sleep apnea  . Varicose vein  Past Surgical History: Past Surgical History: Procedure Laterality Date . BLADDER SURGERY   . TOTAL KNEE ARTHROPLASTY Bilateral  . VARICOSE VEIN SURGERY   HPI: NALU LABA is a 74 year old married male, lives with his spouse and daughter, nonambulatory at baseline, extensive PMH including iron deficiency anemia, lower extremity chronic venous insufficiency/varicose veins, history of DVT and uncertain if still on apixaban (awaiting home medications review by pharmacy), depression, GERD, HLD, HTN, schizoaffective disorder, sleep apnea but not on CPAP, chronic respiratory failure with hypoxia on nocturnal oxygen 2 L/min, rheumatoid arthritis, severe aortic stenosis, prior MSSA bacteremia and chronic left hip pain initially presented to [facility] ED on 11/25/2019 due to productive cough, dyspnea and oxygen saturations in the low 80s at home as per home health PT. Diagnosed with right mid and lower lobe PNA.  Reports  coughing with pos. Barium swallow complete in 2016 indicated significant barium retention in the vallecula, moderate esophageal dysmotility with suggested chronic reflux. No aspiration or penetration. [facility] no report available however this SLP did view study and patient did not appear to aspiration, mild residuals noted post swallow along base of tongue, vallecula, UES.  No data recorded Assessment / Plan / Recommendation CHL IP CLINICAL IMPRESSIONS 11/29/2019 Clinical Impression Pt demonstrates a mild pharyngeal dysphagia with silent frank penetration and trace aspiration events. Deficits are due to appearance of mild weakness, including decreased strangth of laryngeal clsoure during the swallow and also mild base of tongue weakness with entrapment of liquids and pooling in the sinuses post swallow. Attempted a variety of strategies, best method during this study was following sips with a throat clear  and second swallow. Esophageal sweep unrevealing except when pill did lodge mid esophagus, cleared with a second swallow. Given that pt reports an oral infection about a month prior to this hospitalization, improving oral care with denture removal and cleaning three times a day and overnight may improve oral bacterial load. Discussed with pt. Will f/u for education.  SLP Visit Diagnosis Dysphagia, oropharyngeal phase (R13.12) Attention and concentration deficit following -- Frontal lobe and executive function deficit following -- Impact on safety and function --   CHL IP TREATMENT RECOMMENDATION 11/29/2019 Treatment Recommendations Therapy as outlined in treatment plan below   No flowsheet data found. CHL IP DIET RECOMMENDATION 11/29/2019 SLP Diet Recommendations Regular solids;Thin liquid Liquid Administration via Cup;Straw Medication Administration Whole meds with liquid Compensations Slow rate;Small sips/bites;Clear throat after each swallow;Multiple dry swallows after  each bite/sip Postural Changes Remain semi-upright after after feeds/meals (Comment)   CHL IP OTHER RECOMMENDATIONS 11/29/2019 Recommended Consults -- Oral Care Recommendations Oral care before and after PO Other Recommendations --   CHL IP FOLLOW UP RECOMMENDATIONS 11/29/2019 Follow up Recommendations Skilled Nursing facility   [facility] IP FREQUENCY AND DURATION 11/29/2019 Speech Therapy Frequency (ACUTE ONLY) min 2x/week Treatment Duration 2 weeks      CHL IP ORAL PHASE 11/29/2019 Oral Phase WFL Oral - Pudding Teaspoon -- Oral - Pudding Cup -- Oral - Honey Teaspoon -- Oral - Honey Cup -- Oral - Nectar Teaspoon -- Oral - Nectar Cup -- Oral - Nectar Straw -- Oral - Thin Teaspoon -- Oral - Thin Cup -- Oral - Thin Straw -- Oral - Puree -- Oral - Mech Soft -- Oral - Regular -- Oral - Multi-Consistency -- Oral - Pill -- Oral Phase - Comment --  CHL IP PHARYNGEAL PHASE 11/29/2019 Pharyngeal Phase Impaired Pharyngeal- Pudding Teaspoon -- Pharyngeal -- Pharyngeal- Pudding Cup -- Pharyngeal -- Pharyngeal- Honey Teaspoon -- Pharyngeal -- Pharyngeal- Honey Cup -- Pharyngeal -- Pharyngeal- Nectar Teaspoon -- Pharyngeal -- Pharyngeal- Nectar Cup -- Pharyngeal -- Pharyngeal- Nectar Straw -- Pharyngeal -- Pharyngeal- Thin Teaspoon -- Pharyngeal -- Pharyngeal- Thin Cup Reduced airway/laryngeal closure;Reduced tongue base retraction;Penetration/Aspiration during swallow;Penetration/Apiration after swallow;Trace aspiration;Pharyngeal residue - valleculae;Pharyngeal residue - pyriform;Compensatory strategies attempted (with notebox) Pharyngeal Material enters airway, passes BELOW cords without attempt by patient to eject out (silent aspiration);Material enters airway, CONTACTS cords and not ejected out;Material does not enter airway Pharyngeal- Thin Straw Reduced airway/laryngeal closure;Reduced tongue base retraction;Penetration/Aspiration during swallow;Penetration/Apiration after swallow;Trace aspiration;Pharyngeal residue -  valleculae;Pharyngeal residue - pyriform;Compensatory strategies attempted (with notebox) Pharyngeal Material enters airway, CONTACTS cords and not ejected out;Material enters airway, passes BELOW cords without attempt by patient to eject out (silent aspiration) Pharyngeal- Puree Pharyngeal residue - valleculae;Pharyngeal residue - pyriform Pharyngeal -- Pharyngeal- Mechanical Soft -- Pharyngeal -- Pharyngeal- Regular Pharyngeal residue - valleculae;Pharyngeal residue - pyriform Pharyngeal -- Pharyngeal- Multi-consistency -- Pharyngeal -- Pharyngeal- Pill -- Pharyngeal -- Pharyngeal Comment --  CHL IP CERVICAL ESOPHAGEAL PHASE 11/29/2019 Cervical Esophageal Phase WFL Pudding Teaspoon -- Pudding Cup -- Honey Teaspoon -- Honey Cup -- Nectar Teaspoon -- Nectar Cup -- Nectar Straw -- Thin Teaspoon -- Thin Cup -- Thin Straw -- Puree -- Mechanical Soft -- Regular -- Multi-consistency -- Pill -- Cervical Esophageal Comment -- Herbie Baltimore, MA CCC-SLP Acute Rehabilitation Services Pager [phone] Office [phone] Lynann Beaver 11/29/2019, 1:25 PM                PERTINENT LAB RESULTS: CBC: Recent Labs    11/29/19 0231  WBC 5.5  HGB 11.3*  HCT 34.8*  PLT 153   CMET CMP     Component Value Date/Time   NA 134 (L) 11/29/2019 0231   NA 145 05/05/2017 1345   K 4.1 11/29/2019 0231   K 4.4 05/05/2017 1345   CL 101 11/29/2019 0231   CL 104 05/05/2017 1345   CO2 27 11/29/2019 0231   CO2 31 05/05/2017 1345   GLUCOSE 100 (H) 11/29/2019 0231   GLUCOSE 95 05/05/2017 1345   BUN 13 11/29/2019 0231   BUN 17 05/05/2017 1345   CREATININE 0.55 (L) 11/29/2019 0231   CREATININE 0.9 05/05/2017 1345   CALCIUM 8.0 (L) 11/29/2019 0231   CALCIUM 9.3 05/05/2017 1345   PROT 6.2 02/17/2019 1450   PROT 6.7 05/05/2017 1345   ALBUMIN 3.4 (L) 02/17/2019 1450   ALBUMIN 3.2 (L) 05/05/2017 1345   ALBUMIN 3.7 06/20/2016 1449   AST 20 02/17/2019 1450   AST 31 05/05/2017 1345   ALT 12 02/17/2019 1450   ALT  21 05/05/2017 1345   ALKPHOS 66 02/17/2019 1450   ALKPHOS 66 05/05/2017 1345   BILITOT 0.7 02/17/2019 1450   BILITOT 0.70 05/05/2017 1345   GFRNONAA >60 11/29/2019 0231   GFRAA >60 11/29/2019 0231    GFR Estimated Creatinine Clearance: 72.1 mL/min (A) (by C-G formula based on SCr of 0.55 mg/dL (L)). No results for input(s): LIPASE, AMYLASE in the last 72 hours. No results for input(s): CKTOTAL, CKMB, CKMBINDEX, TROPONINI in the last 72 hours. Invalid input(s): POCBNP No results for input(s): DDIMER in the last 72 hours. No results for input(s): HGBA1C in the last 72 hours. No results for input(s): CHOL, HDL, LDLCALC, TRIG, CHOLHDL, LDLDIRECT in the last 72 hours. No results for input(s): TSH, T4TOTAL, T3FREE, THYROIDAB in the last 72 hours.  Invalid input(s): FREET3 No results for input(s): VITAMINB12, FOLATE, FERRITIN, TIBC, IRON, RETICCTPCT in the last 72 hours. Coags: No results for input(s): INR in the last 72 hours.  Invalid input(s): PT Microbiology: Recent Results (from the past 240 hour(s))  Respiratory Panel by RT PCR (Flu A&B, Covid) - Nasopharyngeal Swab     Status: None   Collection Time: 11/25/19  5:43 PM   Specimen: Nasopharyngeal Swab  Result Value Ref Range Status   SARS Coronavirus 2 by RT PCR NEGATIVE NEGATIVE Final    Comment: (NOTE) SARS-CoV-2 target nucleic acids are NOT DETECTED. The SARS-CoV-2 RNA is generally detectable in upper respiratoy specimens during the acute phase of infection. The lowest concentration of SARS-CoV-2 viral copies this assay can detect is 131 copies/mL. A negative result does not preclude SARS-Cov-2 infection and should not be used as the sole basis for treatment or other patient management decisions. A negative result may occur with  improper specimen collection/handling, submission of specimen other than nasopharyngeal swab, presence of viral mutation(s) within the areas targeted by this assay, and inadequate number of viral  copies (<131 copies/mL). A negative result must be combined with clinical observations, patient history, and epidemiological information. The expected result is Negative. Fact Sheet for Patients:  PinkCheek.be Fact Sheet for Healthcare Providers:  GravelBags.it This test is not yet ap proved or cleared by the [place] FDA and  has been authorized for detection and/or diagnosis of SARS-CoV-2 by FDA under an Emergency Use Authorization (EUA). This EUA will remain  in effect (meaning this test can be used) for the duration of the COVID-19 declaration under Section 564(b)(1) of the Act, 21 U.S.C. section 360bbb-3(b)(1), unless the authorization is terminated or revoked sooner.  Influenza A by PCR NEGATIVE NEGATIVE Final   Influenza B by PCR NEGATIVE NEGATIVE Final    Comment: (NOTE) The Xpert Xpress SARS-CoV-2/FLU/RSV assay is intended as an aid in  the diagnosis of influenza from Nasopharyngeal swab specimens and  should not be used as a sole basis for treatment. Nasal washings and  aspirates are unacceptable for Xpert Xpress SARS-CoV-2/FLU/RSV  testing. Fact Sheet for Patients: PinkCheek.be Fact Sheet for Healthcare Providers: GravelBags.it This test is not yet approved or cleared by the [place] FDA and  has been authorized for detection and/or diagnosis of SARS-CoV-2 by  FDA under an Emergency Use Authorization (EUA). This EUA will remain  in effect (meaning this test can be used) for the duration of the  Covid-19 declaration under Section 564(b)(1) of the Act, 21  U.S.C. section 360bbb-3(b)(1), unless the authorization is  terminated or revoked. Performed at [facility], [address] [place], [place] [address]   Blood Culture (routine x 2)     Status: None   Collection Time: 11/25/19  5:45 PM   Specimen: BLOOD RIGHT FOREARM  Result  Value Ref Range Status   Specimen Description   Final    BLOOD RIGHT FOREARM Performed at [facility], [address] [place], [place] [address]    Special Requests   Final    BOTTLES DRAWN AEROBIC AND ANAEROBIC Blood Culture adequate volume Performed at [facility], [address] [place], [place] [address]    Culture   Final    NO GROWTH 5 DAYS Performed at [facility], [place] [address] [place], [place] [address]    Report Status 11/30/2019 FINAL  Final  Blood Culture (routine x 2)     Status: Abnormal   Collection Time: 11/25/19  6:00 PM   Specimen: BLOOD  Result Value Ref Range Status   Specimen Description   Final    BLOOD LEFT ANTECUBITAL Performed at [facility], [address] [place], [place] [address]    Special Requests   Final    BOTTLES DRAWN AEROBIC AND ANAEROBIC Blood Culture adequate volume Performed at [facility], [address] [place], [place] [address]    Culture  Setup Time   Final    GRAM POSITIVE COCCI IN CLUSTERS ANAEROBIC BOTTLE ONLY CRITICAL RESULT CALLED TO, READ BACK BY AND VERIFIED WITH: Fayne Mediate, AT 1756 11/28/19 Performed at [facility], [place] [address] [place], [place] [address]    Culture STAPHYLOCOCCUS SACCHAROLYTICUS (A)  Final   Report Status 12/01/2019 FINAL  Final  Blood Culture ID Panel (Reflexed)     Status: None   Collection Time: 11/25/19  6:00 PM  Result Value Ref Range Status   Enterococcus species NOT DETECTED NOT DETECTED Final   Listeria monocytogenes NOT DETECTED NOT DETECTED Final   Staphylococcus species NOT DETECTED NOT DETECTED Final   Staphylococcus aureus (BCID) NOT DETECTED NOT DETECTED Final   Streptococcus species NOT DETECTED NOT DETECTED Final   Streptococcus agalactiae NOT DETECTED NOT DETECTED Final   Streptococcus pneumoniae NOT DETECTED NOT DETECTED Final   Streptococcus pyogenes NOT DETECTED NOT DETECTED Final   Acinetobacter baumannii  NOT DETECTED NOT DETECTED Final   Enterobacteriaceae species NOT DETECTED NOT DETECTED Final   Enterobacter cloacae complex NOT DETECTED NOT DETECTED Final   Escherichia coli NOT DETECTED NOT DETECTED Final   Klebsiella oxytoca NOT DETECTED NOT DETECTED Final  Klebsiella pneumoniae NOT DETECTED NOT DETECTED Final   Proteus species NOT DETECTED NOT DETECTED Final   Serratia marcescens NOT DETECTED NOT DETECTED Final   Haemophilus influenzae NOT DETECTED NOT DETECTED Final   Neisseria meningitidis NOT DETECTED NOT DETECTED Final   Pseudomonas aeruginosa NOT DETECTED NOT DETECTED Final   Candida albicans NOT DETECTED NOT DETECTED Final   Candida glabrata NOT DETECTED NOT DETECTED Final   Candida krusei NOT DETECTED NOT DETECTED Final   Candida parapsilosis NOT DETECTED NOT DETECTED Final   Candida tropicalis NOT DETECTED NOT DETECTED Final    Comment: Performed at [facility], [place] [address], [place], [place] [address]  Urine culture     Status: Abnormal   Collection Time: 11/25/19 10:31 PM   Specimen: In/Out Cath Urine  Result Value Ref Range Status   Specimen Description   Final    IN/OUT CATH URINE Performed at [facility], [address] [place], [place] [address]    Special Requests   Final    NONE Performed at [facility], [address] [place], [place] [address]    Culture (A)  Final    <10,000 COLONIES/mL INSIGNIFICANT GROWTH Performed at [facility], [place] [address], [place], [place] [address]    Report Status 11/27/2019 FINAL  Final  SARS CORONAVIRUS 2 (TAT 6-24 HRS) Nasopharyngeal Nasopharyngeal Swab     Status: None   Collection Time: 11/29/19  8:52 AM   Specimen: Nasopharyngeal Swab  Result Value Ref Range Status   SARS Coronavirus 2 NEGATIVE NEGATIVE Final    Comment: (NOTE) SARS-CoV-2 target nucleic acids are NOT DETECTED. The SARS-CoV-2 RNA is generally detectable in upper and lower respiratory specimens during the acute  phase of infection. Negative results do not preclude SARS-CoV-2 infection, do not rule out co-infections with other pathogens, and should not be used as the sole basis for treatment or other patient management decisions. Negative results must be combined with clinical observations, patient history, and epidemiological information. The expected result is Negative. Fact Sheet for Patients: SugarRoll.be Fact Sheet for Healthcare Providers: https://www.woods-mathews.com/ This test is not yet approved or cleared by the [place] FDA and  has been authorized for detection and/or diagnosis of SARS-CoV-2 by FDA under an Emergency Use Authorization (EUA). This EUA will remain  in effect (meaning this test can be used) for the duration of the COVID-19 declaration under Section 56 4(b)(1) of the Act, 21 U.S.C. section 360bbb-3(b)(1), unless the authorization is terminated or revoked sooner. Performed at [facility], [place] [address] [place], [place] [address]     FURTHER DISCHARGE INSTRUCTIONS:  Get Medicines reviewed and adjusted: Please take all your medications with you for your next visit with your Primary MD  Laboratory/radiological data: Please request your Primary MD to go over all hospital tests and procedure/radiological results at the follow up, please ask your Primary MD to get all Hospital records sent to his/her office.  In some cases, they will be blood work, cultures and biopsy results pending at the time of your discharge. Please request that your primary care M.D. goes through all the records of your hospital data and follows up on these results.  Also Note the following: If you experience worsening of your admission symptoms, develop shortness of breath, life threatening emergency, suicidal or homicidal thoughts you must seek medical attention immediately by calling 911 or calling your MD immediately  if symptoms less  severe.  You  must read complete instructions/literature along with all the possible adverse reactions/side effects for all the Medicines you take and that have been prescribed to you. Take any new Medicines after you have completely understood and accpet all the possible adverse reactions/side effects.   Do not drive when taking Pain medications or sleeping medications (Benzodaizepines)  Do not take more than prescribed Pain, Sleep and Anxiety Medications. It is not advisable to combine anxiety,sleep and pain medications without talking with your primary care practitioner  Special Instructions: If you have smoked or chewed Tobacco  in the last 2 yrs please stop smoking, stop any regular Alcohol  and or any Recreational drug use.  Wear Seat belts while driving.  Please note: You were cared for by a hospitalist during your hospital stay. Once you are discharged, your primary care physician will handle any further medical issues. Please note that NO REFILLS for any discharge medications will be authorized once you are discharged, as it is imperative that you return to your primary care physician (or establish a relationship with a primary care physician if you do not have one) for your post hospital discharge needs so that they can reassess your need for medications and monitor your lab values.  Total Time spent coordinating discharge including counseling, education and face to face time equals 35 minutes.  Signed: Mithcell Schumpert 12/01/2019 11:00 AM

## 2019-12-01 NOTE — Care Management Important Message (Signed)
Important Message  Patient Details  Name: KHYAIR BROOKBANK MRN: [id number] Date of Birth: 1945-07-30   Medicare Important Message Given:  Yes - Important Message mailed due to current National Emergency  Verbal consent obtained due to current National Emergency  Relationship to patient: Self Contact Name: Shloak Garvey Call Date: 12/01/19  Time: 1137 Phone: [phone] Outcome: Spoke with contact Important Message mailed to: Patient address on file    Delorse Lek 12/01/2019, 11:38 AM

## 2019-12-01 NOTE — TOC Progression Note (Addendum)
Transition of Care [facility]) - Progression Note    Patient Details  Name: Shaun Yoder MRN: [id number] Date of Birth: Nov 29, 1945  [facility] CM/SW [place], LCSW Phone Number: 12/01/2019, 1:30 PM  Clinical Narrative:    CSW spoke with patient's spouse to see if she had selected a facility. She stated she thought that [facility] had the best ratings but asked CSW to ask the patient to be certain. CSW spoke with patient (vey pleasant). He reported that he would like [facility]. CSW let [facility] liaison know and patient has had both vaccines so should be able to have visitation there. Patient reported his daughter would bring him clothes and requested to be transported after she comes. CSW unable to get anyone on the phone with the [facility] [place] today; CSW will alert patient if a long term care approval number comes through. He reports still hoping to return home once the [facility] [place] delivers a hoyer lift.   CSW spoke with patient's wife who reported agreement with plan and is aware CSW will contact [facility] if the [place] calls with a long term care approval number.      Barriers to Discharge: No Barriers Identified  Expected Discharge Plan and Services           Expected Discharge Date: 12/01/19                                     Social Determinants of Health (SDOH) Interventions    Readmission Risk Interventions No flowsheet data found.

## 2019-12-01 NOTE — TOC Transition Note (Signed)
Transition of Care [facility]) - CM/SW Discharge Note   Patient Details  Name: Shaun Yoder MRN: [id number] Date of Birth: 1946/02/27  [facility] CM/SW Contact:  Benard Halsted, LCSW Phone Number: 12/01/2019, 1:23 PM   Clinical Narrative:    Patient will DC to: [facility] Anticipated DC date: 12/01/19 Family notified: Spouse Transport by: Corey Harold  2:30pm   Per MD patient ready for DC to [place]. RN, patient, patient's family, and facility notified of DC. Discharge Summary and FL2 sent to facility. RN to call report prior to discharge [phone] Room 164). DC packet on chart. Ambulance transport requested for patient.   CSW will sign off for now as social work intervention is no longer needed. Please consult [place] again if new needs arise.      Final next level of care: Skilled Nursing Facility Barriers to Discharge: No Barriers Identified   Patient Goals and CMS Choice Patient states their goals for this hospitalization and ongoing recovery are:: Rehab CMS Medicare.gov Compare Post Acute Care list provided to:: Patient Choice offered to / list presented to : Patient, Spouse  Discharge Placement   Existing PASRR number confirmed : 12/01/19          Patient chooses bed at: [facility] Patient to be transferred to facility by: [facility] Name of family member notified: Spouse Patient and family notified of of transfer: 12/01/19  Discharge Plan and Services                                     Social Determinants of Health (SDOH) Interventions     Readmission Risk Interventions No flowsheet data found.

## 2019-12-01 NOTE — Progress Notes (Signed)
Occupational Therapy Treatment Patient Details Name: Shaun Yoder MRN: [id number] DOB: [birth year] Today's Date: 12/01/2019    History of present illness Shaun Yoder is a 74 year old married male, nonambulatory at baseline, PMH: iron deficiency anemia, lower extremity chronic venous insufficiency/varicose veins, history of DVT, depression, GERD, HLD, HTN, schizoaffective disorder, sleep apnea but not on CPAP, chronic respiratory failure with hypoxia on nocturnal oxygen 2 L/min, rheumatoid arthritis, severe aortic stenosis, prior MSSA bacteremia and chronic left hip pain initially presented to [facility] ED on 11/25/2019 due to productive cough, dyspnea and oxygen saturations in the low 80s. Dx with CAP.   OT comments  Patient supine in bed on arrival having difficulty eating breakfast due to RA and limited UE ROM.  Worked on different feeding techniques to try and adapting environment to increase independence.  He was able to eat with set up at bed level.  He completed grooming with set up at bed level.  Addressed his questions about rehab and discussed likely progression of therapy.  Would benefit from built up handles for utensils.  Will continue to follow with OT acutely to address the deficits listed below.    Follow Up Recommendations  SNF;Supervision/Assistance - 24 hour    Equipment Recommendations  Other (comment)(tbd at next venue)    Recommendations for Other Services      Precautions / Restrictions Precautions Precautions: Fall       Mobility Bed Mobility Overal bed mobility: Needs Assistance Bed Mobility: Rolling Rolling: Mod assist            Transfers                      Balance Overall balance assessment: Needs assistance                                         ADL either performed or assessed with clinical judgement   ADL Overall ADL's : Needs assistance/impaired Eating/Feeding: Set up;Bed level Eating/Feeding Details  (indicate cue type and reason): Adaptive techniques - changing environment, did not try adaptive utensils Grooming: Wash/dry face;Wash/dry hands;Set up;Bed level                                       Vision       Perception     Praxis      Cognition Arousal/Alertness: Awake/alert Behavior During Therapy: WFL for tasks assessed/performed Overall Cognitive Status: Within Functional Limits for tasks assessed Area of Impairment: Following commands;Attention;Memory                   Current Attention Level: Selective Memory: Decreased short-term memory Following Commands: Follows one step commands consistently;Follows multi-step commands inconsistently     Problem Solving: Difficulty sequencing;Requires verbal cues General Comments: Patient is very chatty and likes to talk but gets easily distracted.        Exercises     Shoulder Instructions       General Comments      Pertinent Vitals/ Pain       Pain Assessment: Faces Faces Pain Scale: Hurts even more Pain Location: generalized, especially L hip and shoulders Pain Descriptors / Indicators: Grimacing;Guarding;Discomfort;Sore Pain Intervention(s): Limited activity within patient's tolerance;Monitored during session;Repositioned  Home Living  Prior Functioning/Environment              Frequency  Min 2X/week        Progress Toward Goals  OT Goals(current goals can now be found in the care plan section)  Progress towards OT goals: Progressing toward goals  Acute Rehab OT Goals Patient Stated Goal: hopeful for home after ST SNF/therapy OT Goal Formulation: With patient Time For Goal Achievement: 12/11/19 Potential to Achieve Goals: [place] Discharge plan remains appropriate    Co-evaluation                 AM-PAC OT "6 Clicks" Daily Activity     Outcome Measure   Help from another person eating meals?: A  Lot Help from another person taking care of personal grooming?: A Lot Help from another person toileting, which includes using toliet, bedpan, or urinal?: Total Help from another person bathing (including washing, rinsing, drying)?: Total Help from another person to put on and taking off regular upper body clothing?: A Lot Help from another person to put on and taking off regular lower body clothing?: Total 6 Click Score: 9    End of Session    OT Visit Diagnosis: Other abnormalities of gait and mobility (R26.89);Muscle weakness (generalized) (M62.81)   Activity Tolerance Patient tolerated treatment well   Patient Left in bed;with call bell/phone within reach;with bed alarm set   Nurse Communication Mobility status;Need for lift equipment        Time: [phone] OT Time Calculation (min): 31 min  Charges: OT General Charges $OT Visit: 1 Visit OT Treatments $Self Care/Home Management : 23-37 mins  August Luz, OTR/L    Phylliss Bob 12/01/2019, 12:32 PM

## 2019-12-08 ENCOUNTER — Institutional Professional Consult (permissible substitution): Payer: Medicare Other | Admitting: Pulmonary Disease

## 2019-12-08 DIAGNOSIS — M6281 Muscle weakness (generalized): Secondary | ICD-10-CM | POA: Diagnosis not present

## 2019-12-08 DIAGNOSIS — L739 Follicular disorder, unspecified: Secondary | ICD-10-CM | POA: Diagnosis not present

## 2019-12-08 DIAGNOSIS — L89626 Pressure-induced deep tissue damage of left heel: Secondary | ICD-10-CM | POA: Diagnosis not present

## 2019-12-14 ENCOUNTER — Encounter: Payer: Self-pay | Admitting: Internal Medicine

## 2019-12-14 ENCOUNTER — Telehealth: Payer: Medicare Other | Admitting: Internal Medicine

## 2019-12-14 ENCOUNTER — Other Ambulatory Visit: Payer: Self-pay

## 2019-12-14 VITALS — Ht 61.0 in

## 2019-12-14 DIAGNOSIS — I350 Nonrheumatic aortic (valve) stenosis: Secondary | ICD-10-CM | POA: Diagnosis not present

## 2019-12-14 DIAGNOSIS — M419 Scoliosis, unspecified: Secondary | ICD-10-CM | POA: Diagnosis not present

## 2019-12-14 DIAGNOSIS — M069 Rheumatoid arthritis, unspecified: Secondary | ICD-10-CM | POA: Diagnosis not present

## 2019-12-14 DIAGNOSIS — J189 Pneumonia, unspecified organism: Secondary | ICD-10-CM

## 2019-12-14 NOTE — Progress Notes (Signed)
Subjective:    Patient ID: Shaun Yoder, male    DOB: 09-03-1945, 74 y.o.   MRN: [id number]  DOS:  12/14/2019 Type of visit - description:  This is conference requested by the patient's wife.  Patient was admitted to the hospital 11/25/2019, was discharge 12/01/2019. The patient is actually at a rehab facility and the wife requested that visit to discuss his issues.  He was admitted with the DX of Community-acquired pneumonia leading to acute on chronic hypoxic respiratory failure in the context of multiple chronic issues.  They recommended CMP and CBC 1 week after discharge. He was sent to a rehab facility on  Augmentin. The patient reported coughing during meals, SLP consulted, felt to be moderate risk for aspiration, no change in diet made but the shocking technique for better swallowing.   Review of Systems See above   Past Medical History:  Diagnosis Date  . Anemia   . Cancer ([place])    bladder  . Chronic venous insufficiency   . Colitis   . Colon polyps   . Depression   . Diverticulosis   . DVT of deep femoral vein ([place])   . Gastritis   . GERD (gastroesophageal reflux disease)   . History of rectal polyps   . Hyperlipidemia   . Hypertension   . Iron deficiency   . Low back pain   . Lymphedema of both lower extremities   . OA (osteoarthritis)   . Osteonecrosis of shoulder region ([place])   . Schizoaffective disorder, bipolar type ([place])   . Sleep apnea   . Varicose vein     Past Surgical History:  Procedure Laterality Date  . BLADDER SURGERY    . TOTAL KNEE ARTHROPLASTY Bilateral   . VARICOSE VEIN SURGERY      Allergies as of 12/14/2019      Reactions   Leflunomide Diarrhea   Morphine And Related Nausea And Vomiting   Ancef [cefazolin] Rash   Rash   Plaquenil [hydroxychloroquine Sulfate] Rash      Medication List       Accurate as of Dec 14, 2019  4:37 PM. If you have any questions, ask your nurse or doctor.        albuterol 108 (90 Base)  MCG/ACT inhaler Commonly known as: VENTOLIN HFA Inhale 2 puffs into the lungs every 6 (six) hours as needed for wheezing or shortness of breath.   albuterol (2.5 MG/3ML) 0.083% nebulizer solution Commonly known as: PROVENTIL Take 3 mLs (2.5 mg total) by nebulization 4 (four) times daily as needed for wheezing or shortness of breath.   aspirin EC 81 MG tablet Take 81 mg by mouth daily with breakfast.   CALCIUM/D3 ADULT GUMMIES PO Take 1 tablet by mouth 2 (two) times daily with a meal.   divalproex 250 MG DR tablet Commonly known as: DEPAKOTE Take 250 mg by mouth daily after lunch.   divalproex 500 MG 24 hr tablet Commonly known as: DEPAKOTE ER Take 1,000 mg by mouth at bedtime.   Eliquis 5 MG Tabs tablet Generic drug: apixaban Take 2.5 mg by mouth 2 (two) times daily.   Fish Oil 1200 MG Caps Take 1,200 mg by mouth daily with breakfast.   guaiFENesin 600 MG 12 hr tablet Commonly known as: MUCINEX Take 1 tablet (600 mg total) by mouth 2 (two) times daily.   Humira 40 MG/0.4ML Pskt Generic drug: Adalimumab Inject 40 mg into the skin every 14 (fourteen) days. Every other Sunday  ibuprofen 200 MG tablet Commonly known as: ADVIL Take 400 mg by mouth every 6 (six) hours as needed for headache.   ketoconazole 2 % shampoo Commonly known as: NIZORAL Apply 1 application topically every Monday, Wednesday, and Friday.   ketoconazole 2 % cream Commonly known as: NIZORAL Apply 1 application topically daily.   lurasidone 40 MG Tabs tablet Commonly known as: LATUDA Take 1 tablet (40 mg total) by mouth 2 (two) times daily.   melatonin 3 MG Tabs tablet Take 3 mg by mouth at bedtime as needed (sleep).   mirabegron ER 50 MG Tb24 tablet Commonly known as: MYRBETRIQ Take 50 mg by mouth daily with breakfast.   multivitamin with minerals Tabs tablet Take 1 tablet by mouth daily with breakfast.   omeprazole 20 MG capsule Commonly known as: PRILOSEC Take 20 mg by mouth daily  with breakfast.   oxybutynin 5 MG tablet Commonly known as: DITROPAN Take 5 mg by mouth 3 (three) times daily with meals.   oxyCODONE-acetaminophen 5-325 MG tablet Commonly known as: PERCOCET/ROXICET Take 1 tablet by mouth every 6 (six) hours as needed for moderate pain.   polyethylene glycol 17 g packet Commonly known as: MIRALAX / GLYCOLAX Take 17 g by mouth daily.   predniSONE 5 MG tablet Commonly known as: DELTASONE Take 5 mg by mouth daily with breakfast.   SYSTANE OP Place 1 drop into both eyes 2 (two) times daily as needed (dry eyes).   triamcinolone cream 0.1 % Commonly known as: KENALOG Apply 1 application topically 2 (two) times daily as needed (scaly places on knees).   vitamin C 500 MG tablet Commonly known as: ASCORBIC ACID Take 500 mg by mouth daily with breakfast.          Objective:   Physical Exam Ht 5\' 1"  (1.549 m)   BMI 31.87 kg/m  This is a virtual visit, I communicate with the patient's wife    Assessment     Assessment   ( transfer from [facility],02-2015) Hypertension Hyperlipidemia Ao stenosis, severe (Dr Olena Heckle [place] MSK: --Seronegative Rheumatoid arthritis  --DJD-- DR Tamera Punt , dr Ronnie Derby --low back pain d/t DJD, Dr Sherlyn Lick  PSYCH: Schizophrenia, bipolar, depression: Follow-up at the [facility] in [place] GU: Bladder cancer-transitional cell, Dr. Shona Needles GI:   GERD, history of colitis, history of gastritis,  colon polyps- had a virtual cscope 2016, + polyps, declined further eval Pulmomany:   --Sleep apnea:  Cpap intolerant --CT nodules f/u 2 pulmonary  --Chronic respiratory failure with hypoxemia, dx 01-2017, unable to get O2 as of 1-20 21 either from [place] or the [facility]: L LEG DVT 02-2016 + Lupus anticoagulant Varicose veins: s/p remote surgery, has LE wounds  H/o  iron deficiency anemia At the [facility]: sees a dentist, PCP, psych, Ortho, pain mngmt    PLAN This is a video conference, set up by the patient's wife to  discuss recent medical events. Community acquired pneumonia leading to acute on chronic respiratory failure. The patient was discharged from the hospital to a  rehab facility where he is getting all the care he needs. The wife had a number of questions for me regards to his recent admission and we reviewed together recent blood work, EKG and chest x-ray. Her main concern is her inability to take care of him. He has 2 weeks to stay at the current facility and after that the [facility] has offered help but only 4 hours / 3 days a week. At the current state, the patient  is 100% dependent on help, the wife has her own set of medical issues and cannot physically or even emotionally take care of the patient. There is possibly a option for [facility] to cover his stay @ [facility] once he complete rehab, I fully support that option. If the patient needs a letter of support I will be happy to provide it.

## 2019-12-14 NOTE — Progress Notes (Signed)
Pre visit review using our clinic review tool, if applicable. No additional management support is needed unless otherwise documented below in the visit note. 

## 2019-12-15 DIAGNOSIS — L89626 Pressure-induced deep tissue damage of left heel: Secondary | ICD-10-CM | POA: Diagnosis not present

## 2019-12-15 DIAGNOSIS — I739 Peripheral vascular disease, unspecified: Secondary | ICD-10-CM | POA: Diagnosis not present

## 2019-12-15 DIAGNOSIS — M6281 Muscle weakness (generalized): Secondary | ICD-10-CM | POA: Diagnosis not present

## 2019-12-15 NOTE — Assessment & Plan Note (Signed)
This is a video conference, set up by the patient's wife to discuss recent medical events. Community acquired pneumonia leading to acute on chronic respiratory failure. The patient was discharged from the hospital to a  rehab facility where he is getting all the care he needs. The wife had a number of questions for me regards to his recent admission and we reviewed together recent blood work, EKG and chest x-ray. Her main concern is her inability to take care of him. He has 2 weeks to stay at the current facility and after that the [facility] has offered help but only 4 hours / 3 days a week. At the current state, the patient is 100% dependent on help, the wife has her own set of medical issues and cannot physically or even emotionally take care of the patient. There is possibly a option for the [facility] to cover his stay @ [facility] once he complete rehab, I fully support that option. If the patient needs a letter of support I will be happy to provide it.

## 2019-12-17 ENCOUNTER — Encounter: Payer: Self-pay | Admitting: Internal Medicine

## 2019-12-22 DIAGNOSIS — M6281 Muscle weakness (generalized): Secondary | ICD-10-CM | POA: Diagnosis not present

## 2019-12-22 DIAGNOSIS — I739 Peripheral vascular disease, unspecified: Secondary | ICD-10-CM | POA: Diagnosis not present

## 2019-12-22 DIAGNOSIS — L89626 Pressure-induced deep tissue damage of left heel: Secondary | ICD-10-CM | POA: Diagnosis not present

## 2019-12-23 DIAGNOSIS — I872 Venous insufficiency (chronic) (peripheral): Secondary | ICD-10-CM | POA: Diagnosis not present

## 2019-12-23 DIAGNOSIS — L988 Other specified disorders of the skin and subcutaneous tissue: Secondary | ICD-10-CM | POA: Diagnosis not present

## 2019-12-23 DIAGNOSIS — Z9114 Patient's other noncompliance with medication regimen: Secondary | ICD-10-CM | POA: Diagnosis not present

## 2019-12-23 DIAGNOSIS — M069 Rheumatoid arthritis, unspecified: Secondary | ICD-10-CM | POA: Diagnosis not present

## 2019-12-23 DIAGNOSIS — I890 Lymphedema, not elsewhere classified: Secondary | ICD-10-CM | POA: Diagnosis not present

## 2019-12-23 DIAGNOSIS — L97822 Non-pressure chronic ulcer of other part of left lower leg with fat layer exposed: Secondary | ICD-10-CM | POA: Diagnosis not present

## 2019-12-24 ENCOUNTER — Telehealth: Payer: Self-pay

## 2019-12-24 NOTE — Telephone Encounter (Signed)
Shaun Yoder is a medication I typically do not prescribe. Please check on the patient Monday, December 27, 2019, if he has not been able to reach the [facility] I am willing to prescribe a month supply given him time to get his refills.

## 2019-12-24 NOTE — Telephone Encounter (Addendum)
Spoke w/ Pt- he called to let us know he is being discharged home from [facility] rehab facility. He is requesting refill on Latuda. Recommend to contact [facility] for psychiatry. Pt verbalized understanding. Pt can be reached at [phone].

## 2019-12-27 DIAGNOSIS — W19XXXD Unspecified fall, subsequent encounter: Secondary | ICD-10-CM | POA: Diagnosis not present

## 2019-12-27 DIAGNOSIS — M419 Scoliosis, unspecified: Secondary | ICD-10-CM | POA: Diagnosis not present

## 2019-12-27 DIAGNOSIS — M069 Rheumatoid arthritis, unspecified: Secondary | ICD-10-CM | POA: Diagnosis not present

## 2019-12-27 DIAGNOSIS — R040 Epistaxis: Secondary | ICD-10-CM | POA: Diagnosis not present

## 2019-12-27 NOTE — Telephone Encounter (Signed)
Tried calling Pt- no answer, unable to leave message. Will try again later.

## 2019-12-27 NOTE — Telephone Encounter (Signed)
Tried calling again no answer. 

## 2020-01-24 DIAGNOSIS — R531 Weakness: Secondary | ICD-10-CM | POA: Diagnosis not present

## 2020-01-24 DIAGNOSIS — R279 Unspecified lack of coordination: Secondary | ICD-10-CM | POA: Diagnosis not present

## 2020-01-24 DIAGNOSIS — R58 Hemorrhage, not elsewhere classified: Secondary | ICD-10-CM | POA: Diagnosis not present

## 2020-01-24 DIAGNOSIS — R040 Epistaxis: Secondary | ICD-10-CM | POA: Diagnosis not present

## 2020-01-24 DIAGNOSIS — Z743 Need for continuous supervision: Secondary | ICD-10-CM | POA: Diagnosis not present

## 2020-03-09 ENCOUNTER — Telehealth: Payer: Self-pay | Admitting: Internal Medicine

## 2020-03-09 NOTE — Telephone Encounter (Signed)
FMLA refaxed to number provided.

## 2020-03-09 NOTE — Telephone Encounter (Signed)
Caller: [name] (daughter) Call back # [phone]  Patient received a call from human resource stating they have not received FMLA paperwork from May.  Patient is asking if paperwork could be fax to [phone] [id number] Shaun Yoder also, can she pick up a copy.

## 2020-03-09 NOTE — Telephone Encounter (Signed)
Fax confirmation received. Spoke w/ [name]- informed that I refaxed FMLA forms w/ fax confirmation. I also informed I am placing a copy of forms at front desk for her to pick up at her convenience.

## 2020-03-10 DIAGNOSIS — F250 Schizoaffective disorder, bipolar type: Secondary | ICD-10-CM | POA: Diagnosis not present

## 2020-03-10 DIAGNOSIS — R45851 Suicidal ideations: Secondary | ICD-10-CM | POA: Diagnosis not present

## 2020-03-10 DIAGNOSIS — F319 Bipolar disorder, unspecified: Secondary | ICD-10-CM | POA: Diagnosis not present

## 2020-03-10 DIAGNOSIS — I4510 Unspecified right bundle-branch block: Secondary | ICD-10-CM | POA: Diagnosis not present

## 2020-03-10 DIAGNOSIS — R4182 Altered mental status, unspecified: Secondary | ICD-10-CM | POA: Diagnosis not present

## 2020-03-10 DIAGNOSIS — R531 Weakness: Secondary | ICD-10-CM | POA: Diagnosis not present

## 2020-03-10 DIAGNOSIS — F329 Major depressive disorder, single episode, unspecified: Secondary | ICD-10-CM | POA: Diagnosis not present

## 2020-03-10 DIAGNOSIS — Z9181 History of falling: Secondary | ICD-10-CM | POA: Diagnosis not present

## 2020-03-11 DIAGNOSIS — Z7401 Bed confinement status: Secondary | ICD-10-CM | POA: Diagnosis not present

## 2020-03-11 DIAGNOSIS — I4510 Unspecified right bundle-branch block: Secondary | ICD-10-CM | POA: Diagnosis not present

## 2020-03-11 DIAGNOSIS — F250 Schizoaffective disorder, bipolar type: Secondary | ICD-10-CM | POA: Diagnosis not present

## 2020-03-11 DIAGNOSIS — R531 Weakness: Secondary | ICD-10-CM | POA: Diagnosis not present

## 2020-03-11 DIAGNOSIS — F319 Bipolar disorder, unspecified: Secondary | ICD-10-CM | POA: Diagnosis not present

## 2020-03-11 DIAGNOSIS — M2550 Pain in unspecified joint: Secondary | ICD-10-CM | POA: Diagnosis not present

## 2020-03-11 DIAGNOSIS — F329 Major depressive disorder, single episode, unspecified: Secondary | ICD-10-CM | POA: Diagnosis not present

## 2020-03-11 DIAGNOSIS — R0902 Hypoxemia: Secondary | ICD-10-CM | POA: Diagnosis not present

## 2020-03-11 DIAGNOSIS — R45851 Suicidal ideations: Secondary | ICD-10-CM | POA: Diagnosis not present

## 2020-03-20 DIAGNOSIS — I491 Atrial premature depolarization: Secondary | ICD-10-CM | POA: Diagnosis not present

## 2020-03-20 DIAGNOSIS — I4510 Unspecified right bundle-branch block: Secondary | ICD-10-CM | POA: Diagnosis not present

## 2020-03-26 DIAGNOSIS — M898X8 Other specified disorders of bone, other site: Secondary | ICD-10-CM | POA: Diagnosis not present

## 2020-03-26 DIAGNOSIS — G44309 Post-traumatic headache, unspecified, not intractable: Secondary | ICD-10-CM | POA: Diagnosis not present

## 2020-03-26 DIAGNOSIS — Z043 Encounter for examination and observation following other accident: Secondary | ICD-10-CM | POA: Diagnosis not present

## 2020-03-26 DIAGNOSIS — Z743 Need for continuous supervision: Secondary | ICD-10-CM | POA: Diagnosis not present

## 2020-03-26 DIAGNOSIS — I7090 Unspecified atherosclerosis: Secondary | ICD-10-CM | POA: Diagnosis not present

## 2020-03-26 DIAGNOSIS — S0990XA Unspecified injury of head, initial encounter: Secondary | ICD-10-CM | POA: Diagnosis not present

## 2020-03-26 DIAGNOSIS — M25552 Pain in left hip: Secondary | ICD-10-CM | POA: Diagnosis not present

## 2020-03-26 DIAGNOSIS — W07XXXA Fall from chair, initial encounter: Secondary | ICD-10-CM | POA: Diagnosis not present

## 2020-03-26 DIAGNOSIS — W19XXXA Unspecified fall, initial encounter: Secondary | ICD-10-CM | POA: Diagnosis not present

## 2020-03-26 DIAGNOSIS — R5381 Other malaise: Secondary | ICD-10-CM | POA: Diagnosis not present

## 2020-03-26 DIAGNOSIS — S0003XA Contusion of scalp, initial encounter: Secondary | ICD-10-CM | POA: Diagnosis not present

## 2020-03-26 DIAGNOSIS — R279 Unspecified lack of coordination: Secondary | ICD-10-CM | POA: Diagnosis not present

## 2020-03-26 DIAGNOSIS — Y998 Other external cause status: Secondary | ICD-10-CM | POA: Diagnosis not present

## 2020-04-10 DIAGNOSIS — B351 Tinea unguium: Secondary | ICD-10-CM | POA: Diagnosis not present

## 2020-04-10 DIAGNOSIS — M79671 Pain in right foot: Secondary | ICD-10-CM | POA: Diagnosis not present

## 2020-04-10 DIAGNOSIS — M79672 Pain in left foot: Secondary | ICD-10-CM | POA: Diagnosis not present

## 2020-04-14 DIAGNOSIS — M25512 Pain in left shoulder: Secondary | ICD-10-CM | POA: Diagnosis not present

## 2020-04-14 DIAGNOSIS — C678 Malignant neoplasm of overlapping sites of bladder: Secondary | ICD-10-CM | POA: Diagnosis not present

## 2020-04-14 DIAGNOSIS — G8929 Other chronic pain: Secondary | ICD-10-CM | POA: Diagnosis not present

## 2020-04-14 DIAGNOSIS — M87822 Other osteonecrosis, left humerus: Secondary | ICD-10-CM | POA: Diagnosis not present

## 2020-06-15 ENCOUNTER — Emergency Department (HOSPITAL_COMMUNITY): Payer: Medicare Other

## 2020-06-15 ENCOUNTER — Inpatient Hospital Stay (HOSPITAL_COMMUNITY)
Admission: EM | Admit: 2020-06-15 | Discharge: 2020-06-21 | DRG: 291 | Disposition: E | Payer: Medicare Other | Source: Skilled Nursing Facility | Attending: Internal Medicine | Admitting: Internal Medicine

## 2020-06-15 ENCOUNTER — Other Ambulatory Visit: Payer: Self-pay

## 2020-06-15 DIAGNOSIS — Z7982 Long term (current) use of aspirin: Secondary | ICD-10-CM | POA: Diagnosis not present

## 2020-06-15 DIAGNOSIS — L97919 Non-pressure chronic ulcer of unspecified part of right lower leg with unspecified severity: Secondary | ICD-10-CM | POA: Diagnosis present

## 2020-06-15 DIAGNOSIS — M1990 Unspecified osteoarthritis, unspecified site: Secondary | ICD-10-CM | POA: Diagnosis present

## 2020-06-15 DIAGNOSIS — Z79899 Other long term (current) drug therapy: Secondary | ICD-10-CM

## 2020-06-15 DIAGNOSIS — G4730 Sleep apnea, unspecified: Secondary | ICD-10-CM | POA: Diagnosis not present

## 2020-06-15 DIAGNOSIS — M419 Scoliosis, unspecified: Secondary | ICD-10-CM | POA: Diagnosis present

## 2020-06-15 DIAGNOSIS — J690 Pneumonitis due to inhalation of food and vomit: Secondary | ICD-10-CM | POA: Diagnosis not present

## 2020-06-15 DIAGNOSIS — Z7401 Bed confinement status: Secondary | ICD-10-CM

## 2020-06-15 DIAGNOSIS — Z86718 Personal history of other venous thrombosis and embolism: Secondary | ICD-10-CM

## 2020-06-15 DIAGNOSIS — Z515 Encounter for palliative care: Secondary | ICD-10-CM

## 2020-06-15 DIAGNOSIS — I5033 Acute on chronic diastolic (congestive) heart failure: Secondary | ICD-10-CM | POA: Diagnosis present

## 2020-06-15 DIAGNOSIS — E785 Hyperlipidemia, unspecified: Secondary | ICD-10-CM | POA: Diagnosis present

## 2020-06-15 DIAGNOSIS — J9622 Acute and chronic respiratory failure with hypercapnia: Secondary | ICD-10-CM | POA: Diagnosis present

## 2020-06-15 DIAGNOSIS — L97929 Non-pressure chronic ulcer of unspecified part of left lower leg with unspecified severity: Secondary | ICD-10-CM | POA: Diagnosis present

## 2020-06-15 DIAGNOSIS — I350 Nonrheumatic aortic (valve) stenosis: Secondary | ICD-10-CM | POA: Diagnosis present

## 2020-06-15 DIAGNOSIS — D649 Anemia, unspecified: Secondary | ICD-10-CM | POA: Diagnosis present

## 2020-06-15 DIAGNOSIS — K219 Gastro-esophageal reflux disease without esophagitis: Secondary | ICD-10-CM | POA: Diagnosis present

## 2020-06-15 DIAGNOSIS — I10 Essential (primary) hypertension: Secondary | ICD-10-CM | POA: Diagnosis present

## 2020-06-15 DIAGNOSIS — K5790 Diverticulosis of intestine, part unspecified, without perforation or abscess without bleeding: Secondary | ICD-10-CM | POA: Diagnosis present

## 2020-06-15 DIAGNOSIS — Z888 Allergy status to other drugs, medicaments and biological substances status: Secondary | ICD-10-CM

## 2020-06-15 DIAGNOSIS — J90 Pleural effusion, not elsewhere classified: Secondary | ICD-10-CM | POA: Diagnosis not present

## 2020-06-15 DIAGNOSIS — M069 Rheumatoid arthritis, unspecified: Secondary | ICD-10-CM | POA: Diagnosis present

## 2020-06-15 DIAGNOSIS — G4733 Obstructive sleep apnea (adult) (pediatric): Secondary | ICD-10-CM | POA: Diagnosis present

## 2020-06-15 DIAGNOSIS — F0390 Unspecified dementia without behavioral disturbance: Secondary | ICD-10-CM | POA: Diagnosis present

## 2020-06-15 DIAGNOSIS — Z823 Family history of stroke: Secondary | ICD-10-CM

## 2020-06-15 DIAGNOSIS — Z20822 Contact with and (suspected) exposure to covid-19: Secondary | ICD-10-CM | POA: Diagnosis present

## 2020-06-15 DIAGNOSIS — I82402 Acute embolism and thrombosis of unspecified deep veins of left lower extremity: Secondary | ICD-10-CM | POA: Diagnosis present

## 2020-06-15 DIAGNOSIS — G8929 Other chronic pain: Secondary | ICD-10-CM | POA: Diagnosis present

## 2020-06-15 DIAGNOSIS — F250 Schizoaffective disorder, bipolar type: Secondary | ICD-10-CM | POA: Diagnosis present

## 2020-06-15 DIAGNOSIS — I509 Heart failure, unspecified: Secondary | ICD-10-CM

## 2020-06-15 DIAGNOSIS — Z96653 Presence of artificial knee joint, bilateral: Secondary | ICD-10-CM | POA: Diagnosis present

## 2020-06-15 DIAGNOSIS — Z66 Do not resuscitate: Secondary | ICD-10-CM | POA: Diagnosis present

## 2020-06-15 DIAGNOSIS — I82412 Acute embolism and thrombosis of left femoral vein: Secondary | ICD-10-CM

## 2020-06-15 DIAGNOSIS — E8809 Other disorders of plasma-protein metabolism, not elsewhere classified: Secondary | ICD-10-CM | POA: Diagnosis present

## 2020-06-15 DIAGNOSIS — Z8551 Personal history of malignant neoplasm of bladder: Secondary | ICD-10-CM

## 2020-06-15 DIAGNOSIS — J9811 Atelectasis: Secondary | ICD-10-CM | POA: Diagnosis not present

## 2020-06-15 DIAGNOSIS — R0902 Hypoxemia: Secondary | ICD-10-CM | POA: Diagnosis not present

## 2020-06-15 DIAGNOSIS — Z885 Allergy status to narcotic agent status: Secondary | ICD-10-CM

## 2020-06-15 DIAGNOSIS — Z87891 Personal history of nicotine dependence: Secondary | ICD-10-CM | POA: Diagnosis not present

## 2020-06-15 DIAGNOSIS — R0602 Shortness of breath: Secondary | ICD-10-CM | POA: Diagnosis not present

## 2020-06-15 DIAGNOSIS — J9621 Acute and chronic respiratory failure with hypoxia: Secondary | ICD-10-CM | POA: Diagnosis present

## 2020-06-15 DIAGNOSIS — Z7901 Long term (current) use of anticoagulants: Secondary | ICD-10-CM

## 2020-06-15 DIAGNOSIS — M05719 Rheumatoid arthritis with rheumatoid factor of unspecified shoulder without organ or systems involvement: Secondary | ICD-10-CM

## 2020-06-15 DIAGNOSIS — I110 Hypertensive heart disease with heart failure: Principal | ICD-10-CM | POA: Diagnosis present

## 2020-06-15 DIAGNOSIS — Z9119 Patient's noncompliance with other medical treatment and regimen: Secondary | ICD-10-CM

## 2020-06-15 DIAGNOSIS — Z8249 Family history of ischemic heart disease and other diseases of the circulatory system: Secondary | ICD-10-CM | POA: Diagnosis not present

## 2020-06-15 DIAGNOSIS — Z993 Dependence on wheelchair: Secondary | ICD-10-CM

## 2020-06-15 DIAGNOSIS — I517 Cardiomegaly: Secondary | ICD-10-CM | POA: Diagnosis not present

## 2020-06-15 DIAGNOSIS — R1310 Dysphagia, unspecified: Secondary | ICD-10-CM | POA: Diagnosis present

## 2020-06-15 DIAGNOSIS — J811 Chronic pulmonary edema: Secondary | ICD-10-CM | POA: Diagnosis not present

## 2020-06-15 DIAGNOSIS — Z881 Allergy status to other antibiotic agents status: Secondary | ICD-10-CM

## 2020-06-15 DIAGNOSIS — I4891 Unspecified atrial fibrillation: Secondary | ICD-10-CM | POA: Diagnosis not present

## 2020-06-15 DIAGNOSIS — Z7952 Long term (current) use of systemic steroids: Secondary | ICD-10-CM

## 2020-06-15 LAB — URINALYSIS, ROUTINE W REFLEX MICROSCOPIC
Bacteria, UA: NONE SEEN
Bilirubin Urine: NEGATIVE
Glucose, UA: NEGATIVE mg/dL
Hgb urine dipstick: NEGATIVE
Ketones, ur: 5 mg/dL — AB
Leukocytes,Ua: NEGATIVE
Nitrite: NEGATIVE
Protein, ur: 30 mg/dL — AB
Specific Gravity, Urine: 1.027 (ref 1.005–1.030)
pH: 5 (ref 5.0–8.0)

## 2020-06-15 LAB — CBC WITH DIFFERENTIAL/PLATELET
Abs Immature Granulocytes: 0.02 10*3/uL (ref 0.00–0.07)
Basophils Absolute: 0 10*3/uL (ref 0.0–0.1)
Basophils Relative: 0 %
Eosinophils Absolute: 0 10*3/uL (ref 0.0–0.5)
Eosinophils Relative: 0 %
HCT: 39.2 % (ref 39.0–52.0)
Hemoglobin: 12 g/dL — ABNORMAL LOW (ref 13.0–17.0)
Immature Granulocytes: 0 %
Lymphocytes Relative: 14 %
Lymphs Abs: 0.8 10*3/uL (ref 0.7–4.0)
MCH: 29.6 pg (ref 26.0–34.0)
MCHC: 30.6 g/dL (ref 30.0–36.0)
MCV: 96.8 fL (ref 80.0–100.0)
Monocytes Absolute: 0.6 10*3/uL (ref 0.1–1.0)
Monocytes Relative: 11 %
Neutro Abs: 4.4 10*3/uL (ref 1.7–7.7)
Neutrophils Relative %: 75 %
Platelets: 140 10*3/uL — ABNORMAL LOW (ref 150–400)
RBC: 4.05 MIL/uL — ABNORMAL LOW (ref 4.22–5.81)
RDW: 15.9 % — ABNORMAL HIGH (ref 11.5–15.5)
WBC: 5.8 10*3/uL (ref 4.0–10.5)
nRBC: 0 % (ref 0.0–0.2)

## 2020-06-15 LAB — COMPREHENSIVE METABOLIC PANEL
ALT: 19 U/L (ref 0–44)
AST: 28 U/L (ref 15–41)
Albumin: 3 g/dL — ABNORMAL LOW (ref 3.5–5.0)
Alkaline Phosphatase: 87 U/L (ref 38–126)
Anion gap: 8 (ref 5–15)
BUN: 24 mg/dL — ABNORMAL HIGH (ref 8–23)
CO2: 33 mmol/L — ABNORMAL HIGH (ref 22–32)
Calcium: 8.6 mg/dL — ABNORMAL LOW (ref 8.9–10.3)
Chloride: 96 mmol/L — ABNORMAL LOW (ref 98–111)
Creatinine, Ser: 0.62 mg/dL (ref 0.61–1.24)
GFR, Estimated: >60 mL/min (ref >60)
Glucose, Bld: 89 mg/dL (ref 70–99)
Potassium: 4.9 mmol/L (ref 3.5–5.1)
Sodium: 137 mmol/L (ref 135–145)
Total Bilirubin: 0.9 mg/dL (ref 0.3–1.2)
Total Protein: 7.3 g/dL (ref 6.5–8.1)

## 2020-06-15 LAB — RESP PANEL BY RT-PCR (FLU A&B, COVID) ARPGX2
Influenza A by PCR: NEGATIVE
Influenza B by PCR: NEGATIVE
SARS Coronavirus 2 by RT PCR: NEGATIVE

## 2020-06-15 LAB — BLOOD GAS, VENOUS
Acid-Base Excess: 7.1 mmol/L — ABNORMAL HIGH (ref 0.0–2.0)
Bicarbonate: 36 mmol/L — ABNORMAL HIGH (ref 20.0–28.0)
O2 Saturation: 21.6 %
Patient temperature: 98.6
pCO2, Ven: 79.3 mmHg (ref 44.0–60.0)
pH, Ven: 7.279 (ref 7.250–7.430)
pO2, Ven: <31 mmHg — CL (ref 32.0–45.0)

## 2020-06-15 LAB — TROPONIN I (HIGH SENSITIVITY)
Troponin I (High Sensitivity): 96 ng/L — ABNORMAL HIGH (ref <18)
Troponin I (High Sensitivity): 178 ng/L (ref <18)
Troponin I (High Sensitivity): 184 ng/L (ref <18)

## 2020-06-15 LAB — BRAIN NATRIURETIC PEPTIDE: B Natriuretic Peptide: 2249.4 pg/mL — ABNORMAL HIGH (ref 0.0–100.0)

## 2020-06-15 LAB — MAGNESIUM: Magnesium: 1.8 mg/dL (ref 1.7–2.4)

## 2020-06-15 LAB — PHOSPHORUS: Phosphorus: 3.6 mg/dL (ref 2.5–4.6)

## 2020-06-15 LAB — CK: Total CK: 54 U/L (ref 49–397)

## 2020-06-15 LAB — PROTIME-INR
INR: 1.1 (ref 0.8–1.2)
Prothrombin Time: 14.2 seconds (ref 11.4–15.2)

## 2020-06-15 LAB — VALPROIC ACID LEVEL: Valproic Acid Lvl: 29 ug/mL — ABNORMAL LOW (ref 50.0–100.0)

## 2020-06-15 LAB — TSH: TSH: 0.92 u[IU]/mL (ref 0.350–4.500)

## 2020-06-15 MED ORDER — IPRATROPIUM-ALBUTEROL 0.5-2.5 (3) MG/3ML IN SOLN
3.0000 mL | Freq: Four times a day (QID) | RESPIRATORY_TRACT | Status: DC
  Administered 2020-06-16 – 2020-06-17 (×6): 3 mL via RESPIRATORY_TRACT
  Filled 2020-06-15 (×6): qty 3

## 2020-06-15 MED ORDER — OXYBUTYNIN CHLORIDE ER 5 MG PO TB24
10.0000 mg | ORAL_TABLET | Freq: Every day | ORAL | Status: DC
  Administered 2020-06-15 – 2020-06-18 (×4): 10 mg via ORAL
  Filled 2020-06-15 (×4): qty 2

## 2020-06-15 MED ORDER — ACETAMINOPHEN 325 MG PO TABS: 650.0000 mg | ORAL_TABLET | Freq: Four times a day (QID) | ORAL | Status: DC | PRN

## 2020-06-15 MED ORDER — OXYCODONE-ACETAMINOPHEN 5-325 MG PO TABS
1.0000 | ORAL_TABLET | Freq: Four times a day (QID) | ORAL | Status: DC | PRN
  Administered 2020-06-16 – 2020-06-18 (×4): 1 via ORAL
  Filled 2020-06-15 (×4): qty 1

## 2020-06-15 MED ORDER — ASPIRIN EC 81 MG PO TBEC
81.0000 mg | DELAYED_RELEASE_TABLET | Freq: Every day | ORAL | Status: DC
  Administered 2020-06-16 – 2020-06-17 (×2): 81 mg via ORAL
  Filled 2020-06-15 (×2): qty 1

## 2020-06-15 MED ORDER — IPRATROPIUM-ALBUTEROL 0.5-2.5 (3) MG/3ML IN SOLN
3.0000 mL | Freq: Once | RESPIRATORY_TRACT | Status: AC
  Administered 2020-06-15: 3 mL via RESPIRATORY_TRACT
  Filled 2020-06-15: qty 3

## 2020-06-15 MED ORDER — ALBUTEROL SULFATE (2.5 MG/3ML) 0.083% IN NEBU: 2.5000 mg | INHALATION_SOLUTION | RESPIRATORY_TRACT | Status: DC | PRN

## 2020-06-15 MED ORDER — LURASIDONE HCL 40 MG PO TABS
80.0000 mg | ORAL_TABLET | Freq: Every day | ORAL | Status: DC
  Administered 2020-06-15 – 2020-06-18 (×4): 80 mg via ORAL
  Filled 2020-06-15 (×5): qty 2

## 2020-06-15 MED ORDER — PANTOPRAZOLE SODIUM 40 MG PO TBEC
40.0000 mg | DELAYED_RELEASE_TABLET | Freq: Every day | ORAL | Status: DC
  Administered 2020-06-16 – 2020-06-17 (×2): 40 mg via ORAL
  Filled 2020-06-15 (×2): qty 1

## 2020-06-15 MED ORDER — CHLORHEXIDINE GLUCONATE CLOTH 2 % EX PADS
6.0000 | MEDICATED_PAD | Freq: Every day | CUTANEOUS | Status: DC
  Administered 2020-06-17 – 2020-06-19 (×2): 6 via TOPICAL

## 2020-06-15 MED ORDER — PREDNISONE 20 MG PO TABS
20.0000 mg | ORAL_TABLET | Freq: Two times a day (BID) | ORAL | Status: DC
  Administered 2020-06-16 – 2020-06-17 (×3): 20 mg via ORAL
  Filled 2020-06-15 (×3): qty 1

## 2020-06-15 MED ORDER — POLYETHYLENE GLYCOL 3350 17 G PO PACK: 17.0000 g | PACK | Freq: Every day | ORAL | Status: DC | PRN

## 2020-06-15 MED ORDER — METHYLPREDNISOLONE SODIUM SUCC 125 MG IJ SOLR
125.0000 mg | Freq: Once | INTRAMUSCULAR | Status: AC
  Administered 2020-06-15: 125 mg via INTRAVENOUS
  Filled 2020-06-15: qty 2

## 2020-06-15 MED ORDER — FUROSEMIDE 10 MG/ML IJ SOLN
40.0000 mg | Freq: Every day | INTRAMUSCULAR | Status: DC
  Administered 2020-06-16 – 2020-06-17 (×2): 40 mg via INTRAVENOUS
  Filled 2020-06-15 (×3): qty 4

## 2020-06-15 MED ORDER — SODIUM CHLORIDE 0.9% FLUSH
3.0000 mL | Freq: Two times a day (BID) | INTRAVENOUS | Status: DC
  Administered 2020-06-15 – 2020-06-19 (×8): 3 mL via INTRAVENOUS

## 2020-06-15 MED ORDER — MIRABEGRON ER 25 MG PO TB24
25.0000 mg | ORAL_TABLET | Freq: Every day | ORAL | Status: DC
  Administered 2020-06-16 – 2020-06-17 (×2): 25 mg via ORAL
  Filled 2020-06-15 (×4): qty 1

## 2020-06-15 MED ORDER — ACETAMINOPHEN 650 MG RE SUPP: 650.0000 mg | Freq: Four times a day (QID) | RECTAL | Status: DC | PRN

## 2020-06-15 MED ORDER — DIVALPROEX SODIUM ER 250 MG PO TB24
1000.0000 mg | ORAL_TABLET | Freq: Every day | ORAL | Status: DC
  Administered 2020-06-15 – 2020-06-18 (×4): 1000 mg via ORAL
  Filled 2020-06-15 (×4): qty 4

## 2020-06-15 MED ORDER — FUROSEMIDE 10 MG/ML IJ SOLN
40.0000 mg | Freq: Once | INTRAMUSCULAR | Status: AC
  Administered 2020-06-15: 40 mg via INTRAVENOUS
  Filled 2020-06-15: qty 4

## 2020-06-15 MED ORDER — SODIUM CHLORIDE 0.9% FLUSH: 3.0000 mL | INTRAVENOUS | Status: DC | PRN

## 2020-06-15 MED ORDER — ORAL CARE MOUTH RINSE
15.0000 mL | Freq: Two times a day (BID) | OROMUCOSAL | Status: DC
  Administered 2020-06-15 – 2020-06-19 (×8): 15 mL via OROMUCOSAL

## 2020-06-15 MED ORDER — MAGNESIUM SULFATE 2 GM/50ML IV SOLN
2.0000 g | Freq: Once | INTRAVENOUS | Status: AC
  Administered 2020-06-15: 2 g via INTRAVENOUS
  Filled 2020-06-15: qty 50

## 2020-06-15 MED ORDER — APIXABAN 2.5 MG PO TABS
2.5000 mg | ORAL_TABLET | Freq: Two times a day (BID) | ORAL | Status: DC
  Administered 2020-06-15 – 2020-06-18 (×6): 2.5 mg via ORAL
  Filled 2020-06-15 (×6): qty 1

## 2020-06-15 MED ORDER — GUAIFENESIN ER 600 MG PO TB12
600.0000 mg | ORAL_TABLET | Freq: Two times a day (BID) | ORAL | Status: DC
  Administered 2020-06-15 – 2020-06-18 (×6): 600 mg via ORAL
  Filled 2020-06-15 (×6): qty 1

## 2020-06-15 MED ORDER — SODIUM CHLORIDE 0.9 % IV SOLN: 250.0000 mL | INTRAVENOUS | Status: DC | PRN

## 2020-06-15 NOTE — H&P (Signed)
[name] LTJ:[id number] DOB: 1945-09-13 DOA: 05/26/2020     PCP: Colon Branch, MD   Outpatient Specialists:  CARDS:  Dr.Cooper  goes to [place]  Patient arrived to ER on 06/19/2020 at 1351 Referred by Attending Shaun Shanks, MD   Patient coming from:  From facility in [place] in [facility]  Chief Complaint:   Chief Complaint  Patient presents with  . Shortness of Breath    HPI: Shaun Yoder is a 74 y.o. male with medical history significant of scoliosis, debility, Pressure ulcers, PAD, aortic stenosis, bladder Ca, Colitis, DVT, OSA, schozophrenia, peripheral Neuropathy, rheumatoid arthritis, chronic respiratory failure lower extremity lymphedema, memory loss    Presented with patient normally resides at the nursing facility where he is wheelchair-bound.  He was visiting his wife for Thanksgiving. Family noted that he was having more shortness of breath and some wheezing he is usually on few liters of oxygen EMS was called he was satting 60% while using his oxygen tank He was switched to EMS oxygen supply and his oxygenation improved to mid 80s on 2 L he was placed on 4 L going up to the 90s Was provided albuterol and Atrovent nebulizer Patient unable to provide history  Per wife pt has known hx of severe aortic stenosis but have refused care in the past. He has been more SOB for the past 2 wk requiring O2 at his Nursing home He has been also more sleepy he typically falls asleep during conversations.  Have been wheezing No fever no chest pain  Infectious risk factors:  Reports  shortness of breath,     Has   been vaccinated against COVID  And boosted   Initial COVID TEST  NEGATIVE   Lab Results  Component Value Date   SARSCOV2NAA NEGATIVE 06/10/2020   [place] NEGATIVE 11/29/2019   [place] NEGATIVE 11/25/2019   [place] NEGATIVE 10/29/2019    Regarding pertinent Chronic problems:    History of DVT on Eliquis    chronic CHF  diastolic  - last echo 0762,    RA on prednisone and Humira      Hypoventilation - had to be on oxygen for the past 2 wks    OSA -was told in th e past needs CPAP        Dementia/ shizoeffective - on Latuda, Depakot    Chronic anemia - baseline hg Hemoglobin & Hematocrit  Recent Labs    11/28/19 0538 11/29/19 0231 05/29/2020 1439  HGB 10.6* 11.3* 12.0*     While in ER: Noted to be initially in respiratory distress temporarily placed on BiPAP Was given magnesium IV Solu-Medrol Chest x-ray showing some CHF was given 40 mg of Lasix IV  Covid negative  VBG initially showed PCO2 of 79 He was briefly on BiPAP but then was able to come off on 2 nasal cannula His oxygenation has been fluctuating Patient was very reluctant to get back on BiPAP even though he was transiently having increased work of breathing   Hospitalist was called for admission for acute on chronic respiratory failure with hypoxia hypercapnia  The following Work up has been ordered so far:  Orders Placed This Encounter  Procedures  . Resp Panel by RT-PCR (Flu A&B, Covid) Nasopharyngeal Swab  . DG Chest Port 1 View  . Comprehensive metabolic panel  . CBC with Differential  . Blood gas, venous  . Magnesium  . Brain natriuretic peptide  . Protime-INR  . Urinalysis, Routine w reflex  microscopic  . Valproic acid level  . Consult to hospitalist  ALL PATIENTS BEING ADMITTED/HAVING PROCEDURES NEED COVID-19 SCREENING     Following Medications were ordered in ER: Medications  methylPREDNISolone sodium succinate (SOLU-MEDROL) 125 mg/2 mL injection 125 mg (125 mg Intravenous Given 06/10/2020 1545)  magnesium sulfate IVPB 2 g 50 mL (0 g Intravenous Stopped 06/19/2020 1721)  furosemide (LASIX) injection 40 mg (40 mg Intravenous Given 05/25/2020 1545)  ipratropium-albuterol (DUONEB) 0.5-2.5 (3) MG/3ML nebulizer solution 3 mL (3 mLs Nebulization Given 06/18/2020 1728)        Consult Orders  (From admission, onward)           Start     Ordered   06/06/2020 1758  Consult to hospitalist  ALL PATIENTS BEING ADMITTED/HAVING PROCEDURES NEED COVID-19 SCREENING  Once       Comments: ALL PATIENTS BEING ADMITTED/HAVING PROCEDURES NEED COVID-19 SCREENING  Provider:  (Not yet assigned)  Question Answer Comment  Place call to: [facility]   Reason for Consult Admit      06/17/2020 1757          Significant initial  Findings: Abnormal Labs Reviewed  COMPREHENSIVE METABOLIC PANEL - Abnormal; Notable for the following components:      Result Value   Chloride 96 (*)    CO2 33 (*)    BUN 24 (*)    Calcium 8.6 (*)    Albumin 3.0 (*)    All other components within normal limits  CBC WITH DIFFERENTIAL/PLATELET - Abnormal; Notable for the following components:   RBC 4.05 (*)    Hemoglobin 12.0 (*)    RDW 15.9 (*)    Platelets 140 (*)    All other components within normal limits  BLOOD GAS, VENOUS - Abnormal; Notable for the following components:   pCO2, Ven 79.3 (*)    pO2, Ven <31.0 (*)    Bicarbonate 36.0 (*)    Acid-Base Excess 7.1 (*)    All other components within normal limits  BRAIN NATRIURETIC PEPTIDE - Abnormal; Notable for the following components:   B Natriuretic Peptide 2,249.4 (*)    All other components within normal limits  URINALYSIS, ROUTINE W REFLEX MICROSCOPIC - Abnormal; Notable for the following components:   Color, Urine AMBER (*)    Ketones, ur 5 (*)    Protein, ur 30 (*)    All other components within normal limits  VALPROIC ACID LEVEL - Abnormal; Notable for the following components:   Valproic Acid Lvl 29 (*)    All other components within normal limits  TROPONIN I (HIGH SENSITIVITY) - Abnormal; Notable for the following components:   Troponin I (High Sensitivity) 96 (*)    All other components within normal limits    Otherwise labs showing:    Recent Labs  Lab 05/24/2020 1439  NA 137  K 4.9  CO2 33*  GLUCOSE 89  BUN 24*  CREATININE 0.62  CALCIUM 8.6*  MG 1.8     Cr  stable,   Lab Results  Component Value Date   CREATININE 0.62 06/13/2020   CREATININE 0.55 (L) 11/29/2019   CREATININE 0.51 (L) 11/28/2019    Recent Labs  Lab 06/12/2020 1439  AST 28  ALT 19  ALKPHOS 87  BILITOT 0.9  PROT 7.3  ALBUMIN 3.0*   Lab Results  Component Value Date   CALCIUM 8.6 (L) 05/25/2020   PHOS 3.5 03/16/2018   WBC      Component Value Date/Time   WBC 5.8  06/19/2020 1439   LYMPHSABS 0.8 06/05/2020 1439   LYMPHSABS 1.1 06/09/2017 1335   MONOABS 0.6 05/29/2020 1439   EOSABS 0.0 05/30/2020 1439   EOSABS 0.0 06/09/2017 1335   BASOSABS 0.0 05/24/2020 1439   BASOSABS 0.0 06/09/2017 1335    Plt: Lab Results  Component Value Date   PLT 140 (L) 05/24/2020      Venous  Blood Gas result:  PH7.279  PCO2 79.3    ABG    Component Value Date/Time   HCO3 36.0 (H) 05/31/2020 1444   O2SAT 21.6 06/12/2020 1444    HG/HCT stable,      Component Value Date/Time   HGB 12.0 (L) 06/05/2020 1439   HGB 13.2 06/09/2017 1335   HCT 39.2 05/29/2020 1439   HCT 38.3 (L) 06/09/2017 1335   MCV 96.8 05/28/2020 1439   MCV 95 06/09/2017 1335      Troponin 94 Cardiac Panel (last 3 results) Recent Labs    05/26/2020 1444  CKTOTAL 54       ECG: Ordered Personally reviewed by me showing: HR : 84 Rhythm:   RBBB   nonspecific changes      BNP (last 3 results) Recent Labs    11/25/19 1743 06/14/2020 1444  BNP 1,256.6* 2,249.4*       UA no evidence of UTI    Urine analysis:    Component Value Date/Time   COLORURINE AMBER (A) 06/13/2020 1645   APPEARANCEUR CLEAR 06/01/2020 1645   LABSPEC 1.027 05/27/2020 1645   PHURINE 5.0 06/19/2020 1645   GLUCOSEU NEGATIVE 06/05/2020 1645   GLUCOSEU NEGATIVE 08/11/2019 1529   HGBUR NEGATIVE 06/19/2020 1645   BILIRUBINUR NEGATIVE 05/30/2020 1645   BILIRUBINUR neg 04/03/2016 1549   KETONESUR 5 (A) 06/03/2020 1645   PROTEINUR 30 (A) 05/29/2020 1645   UROBILINOGEN 4.0 (A) 08/11/2019 1529   NITRITE NEGATIVE  05/29/2020 1645   LEUKOCYTESUR NEGATIVE 06/02/2020 1645      Ordered   CXR - CHF    ED Triage Vitals  Enc Vitals Group     BP 06/19/2020 1421 122/90     Pulse Rate 06/09/2020 1421 82     Resp 05/23/2020 1421 (!) 25     Temp 06/09/2020 1421 97.7 F (36.5 C)     Temp Source 06/03/2020 1421 Oral     SpO2 05/29/2020 1415 (!) 72 %     Weight 05/29/2020 1422 168 lb 10.4 oz (76.5 kg)     Height 06/19/2020 1422 5\' 1"  (1.549 m)     Head Circumference --      Peak Flow --      Pain Score 06/09/2020 1422 7     Pain Loc --      Pain Edu? --      Excl. in [place]? --   TMAX(24)@       Latest  Blood pressure 98/65, pulse 74, temperature 97.7 F (36.5 C), temperature source Oral, resp. rate (!) 25, height 5\' 1"  (1.549 m), weight 76.5 kg, SpO2 92 %.     Review of Systems:    Pertinent positives include:   Bilateral lower extremity swelling  Lethargy  shortness of breath at rest.   Constitutional:  No weight loss, night sweats, Fevers, chills, fatigue, weight loss  HEENT:  No headaches, Difficulty swallowing,Tooth/dental problems,Sore throat,  No sneezing, itching, ear ache, nasal congestion, post nasal drip,  Cardio-vascular:  No chest pain, Orthopnea, PND, anasarca, dizziness, palpitations.noGI:  No heartburn, indigestion, abdominal pain, nausea, vomiting, diarrhea, change in  bowel habits, loss of appetite, melena, blood in stool, hematemesis Resp:  noNo dyspnea on exertion, No excess mucus, no productive cough, No non-productive cough, No coughing up of blood.No change in color of mucus.No wheezing. Skin:  no rash or lesions. No jaundice GU:  no dysuria, change in color of urine, no urgency or frequency. No straining to urinate.  No flank pain.  Musculoskeletal:  No joint pain or no joint swelling. No decreased range of motion. No back pain.  Psych:  No change in mood or affect. No depression or anxiety. No memory loss.  Neuro: no localizing neurological complaints, no tingling, no weakness, no  double vision, no gait abnormality, no slurred speech, no confusion  All systems reviewed and apart from [place] all are negative  Past Medical History:   Past Medical History:  Diagnosis Date  . Anemia   . Cancer ([place])    bladder  . Chronic venous insufficiency   . Colitis   . Colon polyps   . Depression   . Diverticulosis   . DVT of deep femoral vein ([place])   . Gastritis   . GERD (gastroesophageal reflux disease)   . History of rectal polyps   . Hyperlipidemia   . Hypertension   . Iron deficiency   . Low back pain   . Lymphedema of both lower extremities   . OA (osteoarthritis)   . Osteonecrosis of shoulder region ([place])   . Schizoaffective disorder, bipolar type ([place])   . Sleep apnea   . Varicose vein       Past Surgical History:  Procedure Laterality Date  . BLADDER SURGERY    . TOTAL KNEE ARTHROPLASTY Bilateral   . VARICOSE VEIN SURGERY      Social History:  Ambulatory wheelchair bound, bed bound     reports that he quit smoking about 21 years ago. His smoking use included cigarettes. He has a 107.50 pack-year smoking history. He has never used smokeless tobacco. He reports previous alcohol use. He reports that he does not use drugs.    Family History:   Family History  Problem Relation Age of Onset  . Heart attack Mother   . Heart attack Father   . Stroke Father   . Rheumatologic disease Neg Hx   . Colon cancer Neg Hx   . Lung disease Neg Hx   . Prostate cancer Neg Hx     Allergies: Allergies  Allergen Reactions  . Leflunomide Diarrhea  . Morphine And Related Nausea And Vomiting  . Ancef [Cefazolin] Rash    Rash   . Plaquenil [Hydroxychloroquine Sulfate] Rash     Prior to Admission medications   Medication Sig Start Date End Date Taking? Authorizing Provider  acetaminophen (TYLENOL) 325 MG tablet Take 650 mg by mouth daily.   Yes [provider]  Adalimumab (HUMIRA) 40 MG/0.4ML PSKT Inject 40 mg into the skin every 14 (fourteen)  days. Every other Monday   Yes [provider]  aspirin EC 81 MG tablet Take 81 mg by mouth daily with breakfast.    Yes [provider]  divalproex (DEPAKOTE ER) 500 MG 24 hr tablet Take 1,000 mg by mouth at bedtime.    Yes [provider]  mirabegron ER (MYRBETRIQ) 25 MG TB24 tablet Take 25 mg by mouth daily. 07/28/17  Yes [provider]  Multiple Vitamin (MULTIVITAMIN WITH MINERALS) TABS tablet Take 1 tablet by mouth daily with breakfast.   Yes [provider]  Omega-3 Fatty Acids (  RA FISH OIL) 1000 MG CAPS Take 1,000 mg by mouth daily.   Yes [provider]  omeprazole (PRILOSEC) 20 MG capsule Take 20 mg by mouth daily with breakfast.    Yes [provider]  predniSONE (DELTASONE) 5 MG tablet Take 5 mg by mouth daily with breakfast.   Yes [provider]  vitamin C (ASCORBIC ACID) 500 MG tablet Take 500 mg by mouth daily with breakfast.    Yes [provider]  albuterol (PROVENTIL) (2.5 MG/3ML) 0.083% nebulizer solution Take 3 mLs (2.5 mg total) by nebulization 4 (four) times daily as needed for wheezing or shortness of breath. 08/13/19   Colon Branch, MD  albuterol (VENTOLIN HFA) 108 (90 Base) MCG/ACT inhaler Inhale 2 puffs into the lungs every 6 (six) hours as needed for wheezing or shortness of breath. Patient not taking: Reported on 11/26/2019 07/13/19   Colon Branch, MD  apixaban (ELIQUIS) 5 MG TABS tablet Take 2.5 mg by mouth 2 (two) times daily.    [provider]  Calcium-Phosphorus-Vitamin D (CALCIUM/D3 ADULT GUMMIES PO) Take 1 tablet by mouth 2 (two) times daily with a meal.    [provider]  divalproex (DEPAKOTE) 250 MG DR tablet Take 250 mg by mouth daily after lunch.     [provider]  guaiFENesin (MUCINEX) 600 MG 12 hr tablet Take 1 tablet (600 mg total) by mouth 2 (two) times daily. 12/01/19   Ghimire, Henreitta Leber, MD  ibuprofen (ADVIL) 200 MG tablet Take 400 mg by mouth every 6  (six) hours as needed for headache.    [provider]  ketoconazole (NIZORAL) 2 % cream Apply 1 application topically daily. Patient not taking: Reported on 11/26/2019 09/16/18   Colon Branch, MD  ketoconazole (NIZORAL) 2 % shampoo Apply 1 application topically every Monday, Wednesday, and Friday.    [provider]  lurasidone (LATUDA) 40 MG TABS tablet Take 1 tablet (40 mg total) by mouth 2 (two) times daily. 12/01/19   Ghimire, Henreitta Leber, MD  melatonin 3 MG TABS tablet Take 3 mg by mouth at bedtime as needed (sleep).    [provider]  mirabegron ER (MYRBETRIQ) 50 MG TB24 tablet Take 50 mg by mouth daily with breakfast.     [provider]  Omega-3 Fatty Acids (FISH OIL) 1200 MG CAPS Take 1,200 mg by mouth daily with breakfast.    [provider]  oxybutynin (DITROPAN) 5 MG tablet Take 5 mg by mouth 3 (three) times daily with meals.     [provider]  oxybutynin (DITROPAN-XL) 10 MG 24 hr tablet Take 10 mg by mouth daily. 06/11/20   [provider]  oxyCODONE-acetaminophen (PERCOCET/ROXICET) 5-325 MG tablet Take 1 tablet by mouth every 6 (six) hours as needed for moderate pain. 12/01/19   Ghimire, Henreitta Leber, MD  Polyethyl Glycol-Propyl Glycol (SYSTANE OP) Place 1 drop into both eyes 2 (two) times daily as needed (dry eyes).    [provider]  polyethylene glycol (MIRALAX / GLYCOLAX) 17 g packet Take 17 g by mouth daily. 12/01/19   Ghimire, Henreitta Leber, MD  triamcinolone cream (KENALOG) 0.1 % Apply 1 application topically 2 (two) times daily as needed (scaly places on knees).     [provider]   Physical Exam: Vitals with BMI 06/12/2020 05/31/2020 06/06/2020  Height - - -  Weight - - -  BMI - - -  Systolic 98 662 947  Diastolic 65 73 90  Pulse 74 79 71     1. General:  in No Acute distress   chronically ill  -appearing 2. Psychological: Awake and easily arousable but mainly keeps his eyes closed  Oriented to  self  3. Head/ENT:   Moist   Mucous Membranes                          Head Non traumatic, neck supple                            Poor Dentition dentures in place 4. SKIN: normal  Skin turgor,  Skin clean Dry lower extremities wrapped patient states his wrist he is due for changes tomorrow. Small ulcerations visualized on the toes appears to be noninfected  5. Heart: Regular rate and rhythm loud systolic murmur, no Rub or gallop 6. Lungs: no wheezes or crackles   7. Abdomen: Soft,  non-tender, Non distended  Obese bowel sounds present 8. Lower extremities: no clubbing, cyanosis, no  edema 9. Neurologically Grossly intact, moving all 4 extremities equally   10. MSK: Normal range of motion  All other LABS:     Recent Labs  Lab 05/29/2020 1439  WBC 5.8  NEUTROABS 4.4  HGB 12.0*  HCT 39.2  MCV 96.8  PLT 140*     Recent Labs  Lab 05/25/2020 1439  NA 137  K 4.9  CL 96*  CO2 33*  GLUCOSE 89  BUN 24*  CREATININE 0.62  CALCIUM 8.6*  MG 1.8     Recent Labs  Lab 06/14/2020 1439  AST 28  ALT 19  ALKPHOS 87  BILITOT 0.9  PROT 7.3  ALBUMIN 3.0*       Cultures:    Component Value Date/Time   SDES  11/25/2019 2231    IN/OUT CATH URINE Performed at [facility], [address], [place], [place] [address]    [facility]  11/25/2019 2231    NONE Performed at [facility], [address] [place], [place] [address]    CULT (A) 11/25/2019 2231    <10,000 COLONIES/mL INSIGNIFICANT GROWTH Performed at [facility] [address] [place], [place] [address]    REPTSTATUS 11/27/2019 FINAL 11/25/2019 2231     Radiological Exams on Admission: DG Chest Port 1 View  Result Date: 06/19/2020 CLINICAL DATA:  Shortness of breath. EXAM: PORTABLE CHEST 1 VIEW COMPARISON:  11/25/2019 FINDINGS: The heart is enlarged. Moderate tortuosity of the thoracic aorta. Perihilar central vascular congestion and fairly fulminant pattern of perihilar pulmonary edema.  There are also bilateral pleural effusions. Right lower lobe atelectasis versus is infiltrate/pneumonia. Stable severe degenerative changes involving both shoulder joints. IMPRESSION: CHF with bilateral pleural effusions. Probable bibasilar atelectasis. Could not exclude a right lower lobe infiltrate. Electronically Signed   By: Marijo Sanes M.D.   On: 06/07/2020 14:54    Chart has been reviewed   Assessment/Plan   74 y.o. male with medical history significant of scoliosis, debility, Pressure ulcers, PAD, aortic stenosis, bladder Ca, Colitis, DVT, OSA, schozophrenia, peripheral Neuropathy, rheumatoid arthritis, chronic respiratory failure lower extremity lymphedema, memory loss     Admitted for acute on chronic respiratory failure with hypoxia hypercapnia   Present on Admission: . Acute on chronic respiratory failure with hypoxia and hypercapnia (HCC)-multifactorial etiology acute on chronic likely in the setting of chronic hypoxia, no known history of COPD Wheezing could be  cardiac in nature.  But apparently patient did respond partially to albuterol Atrovent nebs Currently appears to be improving after diuresis Discussed use of BiPAP patient agreeable to use as needed If evidence of lethargy more significant at his baseline would need repeat VBG and restarting BiPAP Patient does not wish any aggressive interventions besides that Given possible contribution of reactive airway disease continue nebulizers as needed Increase prednisone to 20 twice daily  . Acute on chronic diastolic CHF (congestive heart failure) (HCC) -Will  diuresis monitor on telemetry cycle cardiac enzymes and repeat echogram Monitor kidney function while being diuresed  . Sleep apnea -has not been using CPAP has had trouble scheduling appointment may need help with this at the time of discharge  . Schizoaffective disorder, bipolar type ([place]) continue Latuda and Depakote Depakote level nontoxic  . Rheumatoid  arthritis (HCC) continue prednisone given possibly reactive airway disease flexion increase the dose  . Left leg DVT (HCC) -continue Eliquis  . Hypertension -monitor blood pressure while being diuresed  . Hyperlipidemia -not on statins at baseline  . GERD (gastroesophageal reflux disease) chronic continue home meds  Lower extremity wounds -wound care consult appreciated patient has been receiving wound care while at the nursing home  Elevated troponin -in the setting of CHF exacerbation no reports of chest pain continue to monitor obtain echogram continue cycle Patient not interested in aggressive interventions  Hypoalbuminemia we will check prealbumin and nutritional consult Given chronic wounds Other plan as per orders.  DVT prophylaxis:   Eliquis    Code Status:    Code Status: Prior  DNR/DNI no aggressive intervention as per patient  family  I had personally discussed CODE STATUS with patient and family   per rerecords have been followed by Hospice while at SNF  Family Communication:   Family   at  Bedside  plan of care was discussed  with  Wife,   Disposition Plan:                              Back to current facility when stable                             Following barriers for discharge:                         Hypoxia stable                        diuresed                     Would benefit from PT/OT eval prior to DC  Ordered                   Swallow eval - SLP ordered                                     Transition of care consulted                   Nutrition    consulted                  Wound care  consulted  Consults called: none  Admission status:  ED Disposition    ED Disposition Condition [place]: [facility] [[id number]]  Level of Care: Stepdown [14]  Admit to SDU based on following criteria: Respiratory Distress:  Frequent assessment and/or intervention to maintain adequate  ventilation/respiration, pulmonary toilet, and respiratory treatment.  May admit patient to [facility] or [facility] if equivalent level of care is available:: No  Covid Evaluation: Confirmed COVID Negative  Diagnosis: Acute on chronic diastolic CHF (congestive heart failure) [facility]) [[id number]]  Admitting Physician: Toy Baker [[id number]]  Attending Physician: Toy Baker [[id number]]  Estimated length of stay: past midnight tomorrow  Certification:: I certify this patient will need inpatient services for at least 2 midnights        inpatient     I Expect 2 midnight stay secondary to severity of patient's current illness need for inpatient interventions justified by the following:  hemodynamic instability despite optimal treatment (  tachypnea  hypoxia, hypercapnia)  Severe lab/radiological/exam abnormalities including:    CHF on CXR and extensive comorbidities including:  Chronic pain   CHF   dementia  Chronic anticoagulation  That are currently affecting medical management.   I expect  patient to be hospitalized for 2 midnights requiring inpatient medical care.  Patient is at high risk for adverse outcome (such as loss of life or disability) if not treated.  Indication for inpatient stay as follows:    Hemodynamic instability despite maximal medical therapy,    New or worsening hypoxia  Need for IV diuretics, PRN BiPAP    Level of care    SDU tele indefinitely please discontinue once patient no longer qualifies COVID-19 Labs    Lab Results  Component Value Date   [place] NEGATIVE 06/14/2020     Precautions: admitted as  Covid Negative   PPE: Used by the provider:   P100  eye Goggles,  Gloves     Shaun Yoder 06/17/2020, 8:02 PM    [facility]     after 2 AM please page floor coverage PA If 7AM-7PM, please contact the day team taking care of the patient using [email or web address]   Patient was evaluated in the context of the global COVID-19  pandemic, which necessitated consideration that the patient might be at risk for infection with the SARS-CoV-2 virus that causes COVID-19. Institutional protocols and algorithms that pertain to the evaluation of patients at risk for COVID-19 are in a state of rapid change based on information released by regulatory bodies including the CDC and federal and state organizations. These policies and algorithms were followed during the patient's care.

## 2020-06-15 NOTE — ED Provider Notes (Addendum)
New onset CHF. Uses O2 at SNF. Anticipate admit for hypoxia, CHF. Physical Exam  BP 110/77   Pulse 80   Temp 97.7 F (36.5 C) (Oral)   Resp (!) 25   Ht 5\' 1"  (1.549 m)   Wt 76.5 kg   SpO2 100%   BMI 31.87 kg/m   Physical Exam Constitutional:      Comments: Patient has moderate to severe increased work of breathing.  He is on nonrebreather mask.  He is mildly anxious.  Speaking in full short sentences.  Chronically ill in appearance.  HENT:     Head: Normocephalic and atraumatic.     Mouth/Throat:     Pharynx: Oropharynx is clear.     Comments: Patient is protecting airway well Eyes:     Extraocular Movements: Extraocular movements intact.  Cardiovascular:     Comments: Heart sounds are very distant and obscured by respiratory noise. Pulmonary:     Comments: Moderate increased work of breathing.  Tachypnea.  Patient has coarse wheeze throughout mid and upper lung fields.  Significantly diminished breath sounds to the bases.  Scattered crackles mid lung field the base Abdominal:     Palpations: Abdomen is soft.     Tenderness: There is no abdominal tenderness.  Musculoskeletal:     Cervical back: Neck supple.     Comments: Patient presents chronic lower extremity edema.  He is wearing special compression dressing wraps and compression hose as well.  Skin:    General: Skin is warm and dry.  Neurological:     Comments: Patient is awake and situationally oriented.  He is anxious and slightly agitated.  He is redirectable and cooperative.     ED Course/Procedures   Clinical Course as of Jun 15 1617  Thu Jun 15, 2020  1524 Chest x-ray consistent with CHF. Last cardiac echo in 2019 had normal systolic ejection fraction. IV Lasix ordered.   [EW]    Clinical Course User Index [EW] Daleen Bo, MD    Procedures CRITICAL CARE Performed by: Charlesetta Shanks   Total critical care time: 30 minutes  Critical care time was exclusive of separately billable procedures and  treating other patients.  Critical care was necessary to treat or prevent imminent or life-threatening deterioration.  Critical care was time spent personally by me on the following activities: development of treatment plan with patient and/or surrogate as well as nursing, discussions with consultants, evaluation of patient's response to treatment, examination of patient, obtaining history from patient or surrogate, ordering and performing treatments and interventions, ordering and review of laboratory studies, ordering and review of radiographic studies, pulse oximetry and re-evaluation of patient's condition. MDM  Care assumed from Dr. Eulis Foster.  At this time patient chest x-ray shows significant vascular congestion and cardiomegaly.  Patient has had a dose of Lasix, Solu-Medrol and mag and placed on nonrebreather mask.  I have assessed the patient and find that he still exhibits increased work of breathing.  VBG has returned with PCO2 of 79.  Patient appears to be retaining CO2.  His wife does acknowledge that he has been mildly confused and more agitated than would be normal for him.  Patient has had Covid vaccine and booster.  He has not had any recent symptoms of Covid.  Will order BiPAP to assist with ventilation.  We will add DuoNeb.  BNP and Covid results still pending.  Patient will need to be admitted for hypoxic and hypercapnic respiratory failure.   Respiratory evaluated the patient was  able to wean him back to his 2 L nasal cannula.  Patient oxygen saturations are 94 to 95% on 2 L nasal cannula.  He has had DuoNeb.  Patient has tested negative for Covid.  Patient remains alert.  Still dyspneic but at this time does not imminently need BiPAP.  Patient is situationally oriented.  His wife advises that he got very anxious because he is worried about dying.  Patient does not wish to take BiPAP at this time but agrees that he will if necessary.   Charlesetta Shanks, MD 06/05/2020 1656    Charlesetta Shanks, MD 06/13/2020 1800

## 2020-06-15 NOTE — ED Triage Notes (Signed)
Pt came from home via [place]. He normally lives in a SNF in [place] according to family. Family noticed that he looked short of breath and called [employer]. [employer] reports that pt's O2 sat got down to 60% on RA, got up to 90% after albuterol treatment. 88% on 4L.

## 2020-06-15 NOTE — ED Provider Notes (Signed)
[facility] DEPT Provider Note   CSN: [id number] Arrival date & time: 06/12/2020  1351     History Chief Complaint  Patient presents with  . Shortness of Breath    Shaun Yoder is a 74 y.o. male.  HPI He presents for evaluation of shortness of breath.  He was visiting family members, at their home, today when they noticed he was having trouble breathing.  On EMS arrival his saturations were low, "60s", while using his oxygen tank.  He was placed on EMS oxygen and sats improved to the mid 80s, at 2 L.  He was placed on 4 L nasal cannula oxygen with persistent borderline oxygenation at 90%.  He was then given an albuterol and Atrovent nebulizer with transient improvement in saturation level to 92%, but then diminished back to 88% during transport.  Patient is unable to give any history.  Level 5 caveat-altered mental status    Past Medical History:  Diagnosis Date  . Anemia   . Cancer ([place])    bladder  . Chronic venous insufficiency   . Colitis   . Colon polyps   . Depression   . Diverticulosis   . DVT of deep femoral vein ([place])   . Gastritis   . GERD (gastroesophageal reflux disease)   . History of rectal polyps   . Hyperlipidemia   . Hypertension   . Iron deficiency   . Low back pain   . Lymphedema of both lower extremities   . OA (osteoarthritis)   . 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 Schizoaffective disorder, bipolar type ([place])   . Sleep apnea   . Varicose vein     Patient Active Problem List   Diagnosis Date Noted  . Acute on chronic respiratory failure with hypoxia ([place]) 11/26/2019  . CAP (community acquired pneumonia) 11/25/2019  . Chronic respiratory failure with hypoxia ([place]) 03/19/2018  . Bacteremia due to methicillin susceptible Staphylococcus aureus (MSSA) 12/26/2017  . Nocturnal hypoxemia 02/11/2017  . DJD (degenerative joint disease) 12/06/2016  . Chronic right shoulder pain 12/06/2016  . Contracture of  joint of right shoulder region 12/06/2016  . Chronic left hip pain 09/12/2016  . Status post bilateral knee replacements 09/12/2016  . Gait disorder 06/03/2016  . Left leg DVT ([place]) 04/04/2016  . Non-pressure chronic ulcer of right ankle with fat layer exposed ([place]) 10/25/2015  . Varicose veins of lower extremities with ulcer ([place]) 09/07/2015  . Annual physical exam 05/23/2015  . PCP NOTES >>>>>>>>>>>>>>>>> 03/28/2015  . Hypertension 03/28/2015  . Pulmonary nodules 12/09/2014  . Sleep apnea 11/14/2014  . GERD (gastroesophageal reflux disease) 11/14/2014  . Morbid obesity ([place]) 11/14/2014  . Aortic stenosis, severe   . Schizoaffective disorder, bipolar type ([place])   . Rheumatoid arthritis ([place])   . Hyperlipidemia   . Bifasicular block   . Mononeuritis of upper limb 05/20/2013  . Pain in soft tissues of limb 05/20/2013  . Idiopathic progressive polyneuropathy 05/20/2013  . 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 Depression 01/13/2013  . Memory loss 01/13/2013    Past Surgical History:  Procedure Laterality Date  . BLADDER SURGERY    . TOTAL KNEE ARTHROPLASTY Bilateral   . VARICOSE VEIN SURGERY         Family History  Problem Relation Age of Onset  . Heart attack Mother   . Heart attack Father   . Stroke Father   . Rheumatologic disease Neg Hx   . Colon cancer Neg Hx   .  Lung disease Neg Hx   . Prostate cancer Neg Hx     Social History   Tobacco Use  . Smoking status: Former Smoker    Packs/day: 2.50    Years: 43.00    Pack years: 107.50    Types: Cigarettes    Quit date: 07/22/1998    Years since quitting: 21.9  . Smokeless tobacco: Never Used  Vaping Use  . Vaping Use: Never used  Substance Use Topics  . Alcohol use: Not Currently    Alcohol/week: 0.0 standard drinks  . Drug use: No    Home Medications Prior to Admission medications   Medication Sig Start Date End Date Taking? Authorizing Provider  Adalimumab (HUMIRA) 40 MG/0.4ML PSKT Inject 40 mg into  the skin every 14 (fourteen) days. Every other Sunday    [provider]  albuterol (PROVENTIL) (2.5 MG/3ML) 0.083% nebulizer solution Take 3 mLs (2.5 mg total) by nebulization 4 (four) times daily as needed for wheezing or shortness of breath. 08/13/19   Colon Branch, MD  albuterol (VENTOLIN HFA) 108 (90 Base) MCG/ACT inhaler Inhale 2 puffs into the lungs every 6 (six) hours as needed for wheezing or shortness of breath. Patient not taking: Reported on 11/26/2019 07/13/19   Colon Branch, MD  apixaban (ELIQUIS) 5 MG TABS tablet Take 2.5 mg by mouth 2 (two) times daily.    [provider]  aspirin EC 81 MG tablet Take 81 mg by mouth daily with breakfast.     [provider]  Calcium-Phosphorus-Vitamin D (CALCIUM/D3 ADULT GUMMIES PO) Take 1 tablet by mouth 2 (two) times daily with a meal.    [provider]  divalproex (DEPAKOTE ER) 500 MG 24 hr tablet Take 1,000 mg by mouth at bedtime.     [provider]  divalproex (DEPAKOTE) 250 MG DR tablet Take 250 mg by mouth daily after lunch.     [provider]  guaiFENesin (MUCINEX) 600 MG 12 hr tablet Take 1 tablet (600 mg total) by mouth 2 (two) times daily. 12/01/19   Ghimire, Henreitta Leber, MD  ibuprofen (ADVIL) 200 MG tablet Take 400 mg by mouth every 6 (six) hours as needed for headache.    [provider]  ketoconazole (NIZORAL) 2 % cream Apply 1 application topically daily. Patient not taking: Reported on 11/26/2019 09/16/18   Colon Branch, MD  ketoconazole (NIZORAL) 2 % shampoo Apply 1 application topically every Monday, Wednesday, and Friday.    [provider]  lurasidone (LATUDA) 40 MG TABS tablet Take 1 tablet (40 mg total) by mouth 2 (two) times daily. 12/01/19   Ghimire, Henreitta Leber, MD  melatonin 3 MG TABS tablet Take 3 mg by mouth at bedtime as needed (sleep).    [provider]  mirabegron ER (MYRBETRIQ) 50 MG TB24 tablet Take 50 mg by mouth daily with breakfast.     [provider]  Multiple Vitamin (MULTIVITAMIN WITH MINERALS) TABS tablet Take 1 tablet by mouth daily with breakfast.    [provider]  Omega-3 Fatty Acids (FISH OIL) 1200 MG CAPS Take 1,200 mg by mouth daily with breakfast.    [provider]  omeprazole (PRILOSEC) 20 MG capsule Take 20 mg by mouth daily with breakfast.     [provider]  oxybutynin (DITROPAN) 5 MG tablet Take 5 mg by mouth 3 (three) times daily with meals.     [provider]  oxyCODONE-acetaminophen (PERCOCET/ROXICET) 5-325 MG tablet Take 1 tablet  by mouth every 6 (six) hours as needed for moderate pain. 12/01/19   Ghimire, Henreitta Leber, MD  Polyethyl Glycol-Propyl Glycol (SYSTANE OP) Place 1 drop into both eyes 2 (two) times daily as needed (dry eyes).    [provider]  polyethylene glycol (MIRALAX / GLYCOLAX) 17 g packet Take 17 g by mouth daily. 12/01/19   Ghimire, Henreitta Leber, MD  predniSONE (DELTASONE) 5 MG tablet Take 5 mg by mouth daily with breakfast.    [provider]  triamcinolone cream (KENALOG) 0.1 % Apply 1 application topically 2 (two) times daily as needed (scaly places on knees).     [provider]  vitamin C (ASCORBIC ACID) 500 MG tablet Take 500 mg by mouth daily with breakfast.     [provider]    Allergies    Leflunomide, Morphine and related, Ancef [cefazolin], and Plaquenil [hydroxychloroquine sulfate]  Review of Systems   Review of Systems  All other systems reviewed and are negative.   Physical Exam Updated Vital Signs BP 128/75   Pulse 78   Temp 97.7 F (36.5 C) (Oral)   Resp (!) 27   Ht 5\' 1"  (1.549 m)   Wt 76.5 kg   SpO2 100%   BMI 31.87 kg/m   Physical Exam Vitals and nursing note reviewed.  Constitutional:      General: He is in acute distress.     Appearance: He is well-developed. He is ill-appearing. He is not toxic-appearing or diaphoretic.  HENT:     Head: Normocephalic and atraumatic.     Right  Ear: External ear normal.     Left Ear: External ear normal.  Eyes:     Conjunctiva/sclera: Conjunctivae normal.     Pupils: Pupils are equal, round, and reactive to light.  Neck:     Trachea: Phonation normal.  Cardiovascular:     Rate and Rhythm: Normal rate and regular rhythm.     Heart sounds: Normal heart sounds.  Pulmonary:     Effort: No respiratory distress.     Breath sounds: No stridor.     Comments: Decreased air movement bilaterally with scattered wheezes.  No increased work of breathing. Abdominal:     Palpations: Abdomen is soft.     Tenderness: There is no abdominal tenderness.  Musculoskeletal:        General: Normal range of motion.     Cervical back: Normal range of motion and neck supple.  Skin:    General: Skin is warm and dry.  Neurological:     Mental Status: He is alert.     Cranial Nerves: No cranial nerve deficit.     Sensory: No sensory deficit.     Motor: No abnormal muscle tone.     Coordination: Coordination normal.  Psychiatric:        Mood and Affect: Mood normal.        Behavior: Behavior normal.     ED Results / Procedures / Treatments   Labs (all labs ordered are listed, but only abnormal results are displayed) Labs Reviewed  RESP PANEL BY RT-PCR (FLU A&B, COVID) ARPGX2  COMPREHENSIVE METABOLIC PANEL  CBC WITH DIFFERENTIAL/PLATELET  BLOOD GAS, VENOUS  MAGNESIUM  BRAIN NATRIURETIC PEPTIDE    EKG None   Date: 05/26/2020  Rate: 84  Rhythm: normal sinus rhythm  QRS Axis: left  PR and QT Intervals: normal  ST/T Wave abnormalities: normal  PR and QRS Conduction Disutrbances:right bundle branch block and left anterior fascicular block  Radiology DG Chest Port 1 View  Result Date: 06/08/2020 CLINICAL DATA:  Shortness of breath. EXAM: PORTABLE CHEST 1 VIEW COMPARISON:  11/25/2019 FINDINGS: The heart is enlarged. Moderate tortuosity of the thoracic aorta. Perihilar central vascular congestion and fairly fulminant pattern of  perihilar pulmonary edema. There are also bilateral pleural effusions. Right lower lobe atelectasis versus is infiltrate/pneumonia. Stable severe degenerative changes involving both shoulder joints. IMPRESSION: CHF with bilateral pleural effusions. Probable bibasilar atelectasis. Could not exclude a right lower lobe infiltrate. Electronically Signed   By: Marijo Sanes M.D.   On: 05/30/2020 14:54    Procedures .Critical Care Performed by: Daleen Bo, MD Authorized by: Daleen Bo, MD   Critical care provider statement:    Critical care time (minutes):  35   Critical care start time:  05/28/2020 2:15 PM   Critical care end time:  05/26/2020 4:30 PM   Critical care time was exclusive of:  Separately billable procedures and treating other patients   Critical care was necessary to treat or prevent imminent or life-threatening deterioration of the following conditions:  Cardiac failure   Critical care was time spent personally by me on the following activities:  Blood draw for specimens, development of treatment plan with patient or surrogate, discussions with consultants, evaluation of patient's response to treatment, examination of patient, obtaining history from patient or surrogate, ordering and performing treatments and interventions, ordering and review of laboratory studies, pulse oximetry, re-evaluation of patient's condition, review of old charts and ordering and review of radiographic studies   (including critical care time)  Medications Ordered in ED Medications  magnesium sulfate IVPB 2 g 50 mL (has no administration in time range)  methylPREDNISolone sodium succinate (SOLU-MEDROL) 125 mg/2 mL injection 125 mg (125 mg Intravenous Given 05/27/2020 1545)  furosemide (LASIX) injection 40 mg (40 mg Intravenous Given 06/02/2020 1545)    ED Course  I have reviewed the triage vital signs and the nursing notes.  Pertinent labs & imaging results that were available during my care of the  patient were reviewed by me and considered in my medical decision making (see chart for details).  Clinical Course as of Jun 15 1558  Thu Jun 15, 2020  1524 Chest x-ray consistent with CHF. Last cardiac echo in 2019 had normal systolic ejection fraction. IV Lasix ordered.   [EW]    Clinical Course User Index [EW] Daleen Bo, MD   MDM Rules/Calculators/A&P                           Patient Vitals for the past 24 hrs:  BP Temp Temp src Pulse Resp SpO2 Height Weight  06/12/2020 1534 128/75 -- -- 78 (!) 27 100 % -- --  05/23/2020 1422 -- -- -- -- -- -- 5\' 1"  (1.549 m) 76.5 kg  06/19/2020 1421 122/90 97.7 F (36.5 C) Oral 82 (!) 25 100 % -- --  05/28/2020 1415 -- -- -- -- -- (!) 72 % -- --    3:59 PM Reevaluation with update and discussion. After initial assessment and treatment, an updated evaluation reveals no change in clinical status.  Wife now in room, and she is updated on findings and plan. Daleen Bo   Medical Decision Making:  This patient is presenting for evaluation of shortness of breath with hypoxia, which does require a range of treatment options, and is a complaint that involves a high risk of morbidity and mortality. The differential diagnoses include CHF, PNE,  COPD exacerbation. I decided to review old records, and in summary elderly male, chronically ill living in a skilled nursing facility presenting with shortness of breath and hypoxia.  I obtained additional historical information from his wife Arlene.  Clinical Laboratory Tests Ordered, included CBC, Metabolic panel and BNP. Review indicates BNP high, venous blood gas consistent with CO2 retention, 79.  Chloride low, CO2 high, calcium low, albumin low, hemoglobin low. Radiologic Tests Ordered, included CXR.  I independently Visualized: Radiograph images, which show CHF  Cardiac Monitor Tracing which shows normal sinus rhythm   Critical Interventions-clinical evaluation, laboratory testing, radiographic, oxygen  therapy to maintain normal oxygenation, observation and reassessment  After These Interventions, the Patient was reevaluated and was found with congestive heart failure, treated with Lasix in the ED.  He will require admission for hypoxia and further work-up.  No indication for advanced airway, he may require BiPAP but notable elevated bicarb indicates chronicity.  CRITICAL CARE-yes Performed by: Daleen Bo  Nursing Notes Reviewed/ Care Coordinated Applicable Imaging Reviewed Interpretation of Laboratory Data incorporated into ED treatment   Plan: Admit after labs return    Final Clinical Impression(s) / ED Diagnoses Final diagnoses:  Congestive heart failure, unspecified HF chronicity, unspecified heart failure type [facility])  Hypoxia    Rx / DC Orders ED Discharge Orders    None       Daleen Bo, MD 06/16/20 [phone]

## 2020-06-15 NOTE — ED Notes (Signed)
Called respiratory for bipap and duoneb treatment. Respiratory will come put pt on bipap soon, will need to wait on the duoneb treatment until his COVID test results

## 2020-06-16 ENCOUNTER — Encounter (HOSPITAL_COMMUNITY): Payer: Self-pay | Admitting: Internal Medicine

## 2020-06-16 ENCOUNTER — Inpatient Hospital Stay (HOSPITAL_COMMUNITY): Payer: Medicare Other

## 2020-06-16 ENCOUNTER — Encounter (HOSPITAL_COMMUNITY): Payer: Self-pay

## 2020-06-16 DIAGNOSIS — I350 Nonrheumatic aortic (valve) stenosis: Secondary | ICD-10-CM | POA: Diagnosis not present

## 2020-06-16 DIAGNOSIS — E785 Hyperlipidemia, unspecified: Secondary | ICD-10-CM | POA: Diagnosis not present

## 2020-06-16 DIAGNOSIS — I5033 Acute on chronic diastolic (congestive) heart failure: Secondary | ICD-10-CM | POA: Diagnosis not present

## 2020-06-16 DIAGNOSIS — J9621 Acute and chronic respiratory failure with hypoxia: Secondary | ICD-10-CM | POA: Diagnosis not present

## 2020-06-16 LAB — CBC WITH DIFFERENTIAL/PLATELET
Abs Immature Granulocytes: 0.01 10*3/uL (ref 0.00–0.07)
Basophils Absolute: 0 10*3/uL (ref 0.0–0.1)
Basophils Relative: 0 %
Eosinophils Absolute: 0 10*3/uL (ref 0.0–0.5)
Eosinophils Relative: 0 %
HCT: 38.4 % — ABNORMAL LOW (ref 39.0–52.0)
Hemoglobin: 11.5 g/dL — ABNORMAL LOW (ref 13.0–17.0)
Immature Granulocytes: 0 %
Lymphocytes Relative: 8 %
Lymphs Abs: 0.3 10*3/uL — ABNORMAL LOW (ref 0.7–4.0)
MCH: 28.5 pg (ref 26.0–34.0)
MCHC: 29.9 g/dL — ABNORMAL LOW (ref 30.0–36.0)
MCV: 95 fL (ref 80.0–100.0)
Monocytes Absolute: 0.1 10*3/uL (ref 0.1–1.0)
Monocytes Relative: 3 %
Neutro Abs: 3 10*3/uL (ref 1.7–7.7)
Neutrophils Relative %: 89 %
Platelets: 117 10*3/uL — ABNORMAL LOW (ref 150–400)
RBC: 4.04 MIL/uL — ABNORMAL LOW (ref 4.22–5.81)
RDW: 15.9 % — ABNORMAL HIGH (ref 11.5–15.5)
WBC: 3.4 10*3/uL — ABNORMAL LOW (ref 4.0–10.5)
nRBC: 0 % (ref 0.0–0.2)

## 2020-06-16 LAB — BLOOD GAS, VENOUS
Acid-Base Excess: 7.4 mmol/L — ABNORMAL HIGH (ref 0.0–2.0)
Bicarbonate: 31.4 mmol/L — ABNORMAL HIGH (ref 20.0–28.0)
O2 Saturation: 99 %
Patient temperature: 98.6
pCO2, Ven: 42.8 mmHg — ABNORMAL LOW (ref 44.0–60.0)
pH, Ven: 7.479 — ABNORMAL HIGH (ref 7.250–7.430)
pO2, Ven: 108 mmHg — ABNORMAL HIGH (ref 32.0–45.0)

## 2020-06-16 LAB — ECHOCARDIOGRAM COMPLETE
AR max vel: 0.82 cm2
AV Area VTI: 0.75 cm2
AV Area mean vel: 0.75 cm2
AV Mean grad: 67.5 mmHg
AV Peak grad: 104.4 mmHg
Ao pk vel: 5.11 m/s
Area-P 1/2: 6.32 cm2
Calc EF: 29.5 %
Height: 61 in
S' Lateral: 4.5 cm
Single Plane A2C EF: 29 %
Single Plane A4C EF: 33.9 %
Weight: 2751.34 oz

## 2020-06-16 LAB — COMPREHENSIVE METABOLIC PANEL
ALT: 16 U/L (ref 0–44)
AST: 26 U/L (ref 15–41)
Albumin: 2.7 g/dL — ABNORMAL LOW (ref 3.5–5.0)
Alkaline Phosphatase: 75 U/L (ref 38–126)
Anion gap: 8 (ref 5–15)
BUN: 24 mg/dL — ABNORMAL HIGH (ref 8–23)
CO2: 31 mmol/L (ref 22–32)
Calcium: 8.4 mg/dL — ABNORMAL LOW (ref 8.9–10.3)
Chloride: 96 mmol/L — ABNORMAL LOW (ref 98–111)
Creatinine, Ser: 0.55 mg/dL — ABNORMAL LOW (ref 0.61–1.24)
GFR, Estimated: >60 mL/min (ref >60)
Glucose, Bld: 110 mg/dL — ABNORMAL HIGH (ref 70–99)
Potassium: 4.5 mmol/L (ref 3.5–5.1)
Sodium: 135 mmol/L (ref 135–145)
Total Bilirubin: 1.2 mg/dL (ref 0.3–1.2)
Total Protein: 6.5 g/dL (ref 6.5–8.1)

## 2020-06-16 LAB — PHOSPHORUS: Phosphorus: 4.1 mg/dL (ref 2.5–4.6)

## 2020-06-16 LAB — PREALBUMIN: Prealbumin: 10.9 mg/dL — ABNORMAL LOW (ref 18–38)

## 2020-06-16 LAB — MAGNESIUM: Magnesium: 1.7 mg/dL (ref 1.7–2.4)

## 2020-06-16 LAB — MRSA PCR SCREENING: MRSA by PCR: POSITIVE — AB

## 2020-06-16 MED ORDER — MUPIROCIN 2 % EX OINT
1.0000 "application " | TOPICAL_OINTMENT | Freq: Two times a day (BID) | CUTANEOUS | Status: DC
  Administered 2020-06-16 – 2020-06-19 (×9): 1 via NASAL
  Filled 2020-06-16 (×2): qty 22

## 2020-06-16 MED ORDER — PERFLUTREN LIPID MICROSPHERE
1.0000 mL | INTRAVENOUS | Status: AC | PRN
  Administered 2020-06-16: 2 mL via INTRAVENOUS
  Filled 2020-06-16: qty 10

## 2020-06-16 MED ORDER — PROSOURCE PLUS PO LIQD
30.0000 mL | Freq: Every day | ORAL | Status: DC
  Administered 2020-06-17 – 2020-06-18 (×2): 30 mL via ORAL
  Filled 2020-06-16 (×2): qty 30

## 2020-06-16 MED ORDER — JUVEN PO PACK
1.0000 | PACK | Freq: Two times a day (BID) | ORAL | Status: DC
  Administered 2020-06-17 – 2020-06-18 (×3): 1 via ORAL
  Filled 2020-06-16 (×3): qty 1

## 2020-06-16 NOTE — TOC Initial Note (Signed)
Transition of Care [facility]) - Initial/Assessment Note    Patient Details  Name: Shaun Yoder MRN: [id number] Date of Birth: [birth year]  Transition of Care [facility]) CM/SW Contact:    Leeroy Cha, RN Phone Number: 06/16/2020, 8:55 AM  Cli 74 y.o. male with medical history significant of scoliosis, debility, Pressure ulcers, PAD, aortic stenosis, bladder Ca, Colitis, DVT, OSA, schozophrenia, peripheral Neuropathy, rheumatoid arthritis, chronic respiratory failure lower extremity lymphedema, memory loss Presented with patient normally resides at the nursing facility where he is wheelchair-bound.  He was visiting his wife for Thanksgiving. Family noted that he was having more shortness of breath and some wheezing he is usually on few liters of oxygen EMS was called he was satting 60% while using his oxygen tank He was switched to EMS oxygen supply and his oxygenation improved to mid 80s on 2 L he was placed on 4 L going up to the 90s Was provided albuterol and Atrovent nebulizer Patient unable to provide history Per wife pt has known hx of severe aortic stenosis but have refused care in the past. He has been more SOB for the past 2 wk requiring O2 at his Nursing home He has been also more sleepy he typically falls asleep during conversations.  Have been wheezing No fever no chest painnical Narrative:                 PLAN:to rturn to [facility] once stable Following for progression.  Expected Discharge Plan: [place]     Patient Goals and CMS Choice Patient states their goals for this hospitalization and ongoing recovery are:: unable to stte from [facility] CMS Medicare.gov Compare Post Acute Care list provided to:: Patient    Expected Discharge Plan and Services Expected Discharge Plan: [place]   Discharge Planning Services: CM Consult   Living arrangements for the past 2 months: [place]                                       Prior Living Arrangements/Services Living arrangements for the past 2 months: [place] Lives with:: Facility Resident                   Activities of Daily Living      Permission Sought/Granted                  Emotional Assessment              Admission diagnosis:  Hypoxia [R09.02] Acute on chronic diastolic CHF (congestive heart failure) (HCC) [I50.33] Congestive heart failure, unspecified HF chronicity, unspecified heart failure type [facility]) [I50.9] Patient Active Problem List   Diagnosis Date Noted  . Acute on chronic respiratory failure with hypoxia and hypercapnia ([place]) 06/19/2020  . Acute on chronic diastolic CHF (congestive heart failure) ([place]) 05/24/2020  . Acute on chronic respiratory failure with hypoxia ([place]) 11/26/2019  . CAP (community acquired pneumonia) 11/25/2019  . Chronic respiratory failure with hypoxia ([place]) 03/19/2018  . Bacteremia due to methicillin susceptible Staphylococcus aureus (MSSA) 12/26/2017  . Nocturnal hypoxemia 02/11/2017  . DJD (degenerative joint disease) 12/06/2016  . Chronic right shoulder pain 12/06/2016  . Contracture of joint of right shoulder region 12/06/2016  . Chronic left hip pain 09/12/2016  . Status post bilateral knee replacements 09/12/2016  . Gait disorder 06/03/2016  . Left leg DVT ([place]) 04/04/2016  . Non-pressure  chronic ulcer of right ankle with fat layer exposed ([place]) 10/25/2015  . Varicose veins of lower extremities with ulcer ([place]) 09/07/2015  . Annual physical exam 05/23/2015  . PCP NOTES >>>>>>>>>>>>>>>>> 03/28/2015  . Hypertension 03/28/2015  . Pulmonary nodules 12/09/2014  . Sleep apnea 11/14/2014  . GERD (gastroesophageal reflux disease) 11/14/2014  . Morbid obesity ([place]) 11/14/2014  . Aortic stenosis, severe   . Schizoaffective disorder, bipolar type ([place])   . Rheumatoid arthritis ([place])   . Hyperlipidemia   . Bifasicular block   . Mononeuritis of upper limb  05/20/2013  . Pain in soft tissues of limb 05/20/2013  . Idiopathic progressive polyneuropathy 05/20/2013  . 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 Depression 01/13/2013  . Memory loss 01/13/2013   PCP:  Colon Branch, MD Pharmacy:   [id number] - [place], [address] [place] [address] Phone: [phone] Fax: [place], [place] - [address] [place] [phone] [place] [address] Phone: [phone] Fax: [phone]     Social Determinants of Health (SDOH) Interventions    Readmission Risk Interventions No flowsheet data found.

## 2020-06-16 NOTE — Progress Notes (Signed)
Wound care to lower extremities washed with warm water and patted dry, Xeroform , Kerlex, and ace wraps , Aquacel to toes with abd pads

## 2020-06-16 NOTE — Progress Notes (Signed)
Initial Nutrition Assessment  DOCUMENTATION CODES:   Obesity unspecified  INTERVENTION:  - will order 30 ml Prosource Plus once/day, each supplement provides 100 kcal and 15 grams protein.  - will order Juven BID, each packet provides 95 calories, 2.5 grams of protein (collagen), and 9.8 grams of carbohydrate (3 grams sugar); also contains 7 grams of L-arginine and L-glutamine, 300 mg vitamin C, 15 mg vitamin E, 1.2 mcg vitamin B-12, 9.5 mg zinc, 200 mg calcium, and 1.5 g  Calcium Beta- hydroxy-Beta-methylbutyrate to support wound healing.   NUTRITION DIAGNOSIS:   Increased nutrient needs related to acute illness, chronic illness, wound healing as evidenced by estimated needs.  GOAL:   Patient will meet greater than or equal to 90% of their needs  MONITOR:   PO intake, Supplement acceptance, Labs, Weight trends  REASON FOR ASSESSMENT:   Consult Assessment of nutrition requirement/status  ASSESSMENT:   74 y.o. male with medical history of scoliosis, debility, chronic BLE pressure ulcers, PAD, aortic stenosis, bladder cancer, colitis, DVT, OSA, schozophrenia, peripheral neuropathy, rheumatoid arthritis, chronic respiratory failure, BLE lymphedema, and memory loss. He resides in a nursing home and is wheelchair-bound. He presented to the ED d/t SOB and wheezing. When EMS arrived, his O2 sats were in the 72s.  No intakes documented since admission. Patient laying in bed and does not verbally interact with RD; he smiled when RD introduced self but no other indication that patient knew RD was in the room.  His son was at bedside. He [place] and has not seen his dad for 1 year but does talk with him on the phone on a frequent basis. He reports that patient eats very well, needs soft foods d/t having dentures. His top denture is currently in and appears to be fitting well. His bottom denture is not in and son reports that it is ill-fitting.   Son reports that BLE edema is a  chronic issue for patient. When RD lightly touched LLE, patient winced so no further assessment of BLE done. Skin on BLE appears very taught. Son does not feel that patient has any abdominal distention compared to a year ago.  Son reports that patient has been rapidly gaining weight and that the pants that were purchased for him for his [birth year] on [birth year] are already tight.   Weight today is 172 lb and weight appears mainly stable since April. Weight on 02/24/19 was 210 lb. This indicates 38 lb weight loss (18% body weight) in the past 15 months; not significant for time frame.   Please see WOC note from today at [facility] for information regarding chronic BLE wounds.    Labs reviewed; Cl: 96 mmol/l, BUN: 24 mg/dl, creatinine: 0.55 mg/dl, Ca: 8.4 mg/dl. Medications reviewed; 40 mg IV lasix/day, 2 g IV Mg sulfate x1 run 11/25, 125 mg solu-medrol x1 dose 11/25, 20 mg deltasone BID.     NUTRITION - FOCUSED PHYSICAL EXAM:    Most Recent Value  Orbital Region No depletion  Upper Arm Region No depletion  Thoracic and Lumbar Region No depletion  Buccal Region No depletion  Temple Region Mild depletion  Clavicle Bone Region Mild depletion  Clavicle and Acromion Bone Region Mild depletion  Scapular Bone Region No depletion  Dorsal Hand No depletion  Patellar Region Unable to assess  Anterior Thigh Region Unable to assess  Posterior Calf Region Unable to assess  Edema (RD Assessment) Unable to assess  Hair Reviewed  Eyes Unable to assess  Mouth Reviewed  Skin Reviewed  Nails Reviewed       Diet Order:   Diet Order            Diet Heart Room service appropriate? Yes; Fluid consistency: Thin  Diet effective now                 EDUCATION NEEDS:   No education needs have been identified at this time  Skin:  Skin Assessment: Reviewed RN Assessment  Last BM:  11/26  Height:   Ht Readings from Last 1 Encounters:  05/22/2020 5\' 1"  (1.549 m)    Weight:   Wt Readings from Last 1  Encounters:  06/16/20 78 kg    Estimated Nutritional Needs:  Kcal:  1650-1850 kcal Protein:  80-90 grams Fluid:  >/= 1.5 L/day     Jarome Matin, MS, RD, LDN, CNSC Inpatient Clinical Dietitian RD pager # available in [facility]  After hours/weekend pager # available in [facility]

## 2020-06-16 NOTE — Evaluation (Signed)
Physical Therapy Evaluation Patient Details Name: Shaun Yoder MRN: [id number] DOB: 1945-09-21 Today's Date: 06/16/2020   History of Present Illness  74 y.o. male with medical history significant of scoliosis, debility, Pressure ulcers, PAD, aortic stenosis, bladder Ca, Colitis, DVT, OSA, schozophrenia, peripheral Neuropathy, rheumatoid arthritis, chronic respiratory failure lower extremity lymphedema, memory loss. Presented to ED with shortness of breath and found to be hypoxic.  Clinical Impression  Shaun Yoder is a 74 year old man admitted to hospital with hypoxia after being found to have increased shortness of breath. Patient has been living at local SNF but had been at home with family when found to be short of breath per chart. According to patient he requires assistance of two to transfer to his motorized wheelchair.   On evaluation patient limited to bed evaluation due to patient refusing to transfer due to fear of falling and pain from wounds on bilateral feet. Patient exhibits poor shoulder ROM but functional strength of elbow, wrist and hand. Patient max assist to roll left and right. Patient reports wanting to go home at discharge and not return to SNF. However, patient needs 24/7 heavy physical assistance at home that cannot be provided by his wife. Therapist recommends return to SNF. Patient will benefit from skilled PT services while in hospital to improve patient's ability to transfer and participate in self care tasks in order to reduce caregiver burden    Follow Up Recommendations SNF    Equipment Recommendations  Rolling walker with 5" wheels    Recommendations for Other Services       Precautions / Restrictions Precautions Precautions: Fall Precaution Comments: wounds on legs, feet, very tender (don't touch if you can help it) Restrictions Weight Bearing Restrictions: No      Mobility  Bed Mobility Overal bed mobility: Needs Assistance Bed Mobility:  Rolling Rolling: Max assist         General bed mobility comments: Max assist to roll left and right, patient assisting with use of bed rails. Patient refusing transfer into sitting reporting he will fall.    Transfers                    Ambulation/Gait                Stairs            Wheelchair Mobility    Modified Rankin (Stroke Patients Only)       Balance                                             Pertinent Vitals/Pain Pain Assessment: Faces Faces Pain Scale: Hurts whole lot Pain Location: Bil feet - with touching Pain Descriptors / Indicators: Burning;Grimacing Pain Intervention(s): Limited activity within patient's tolerance    Home Living Family/patient expects to be discharged to:: Skilled nursing facility                 Additional Comments: Patient has been at [facility]. Non ambulatory at baseline. Was using power chair at home. From prior chart review patient and family waiting on hoyer lift from [place] and wife was his caregiver. Wife reporting she cannot take care of him.    Prior Function Level of Independence: Needs assistance   Gait / Transfers Assistance Needed: Reports +2 assistance for wc transfers.  ADL's / Homemaking Assistance Needed: Assistance  needed. Details limited. Patient continues to report his abilities from home and not his recent abilities. Reports able to feed himself but barely able to wash face and hair due to poor shoulder ROM.  Comments: has [facility]i that he can drive his w/c into     Hand Dominance   Dominant Hand: Right    Extremity/Trunk Assessment   Upper Extremity Assessment Upper Extremity Assessment: RUE deficits/detail;LUE deficits/detail RUE Deficits / Details: Shoulder 2+/5, elbow 4/5, wrist 5/5, grip 4/5 RUE Sensation: WNL RUE Coordination: WNL LUE Deficits / Details: Shoulder 2+/5, elbow 4/5, wrist 5/5, grip 4/5 LUE Sensation: WNL LUE Coordination: WNL    Lower Extremity  Assessment Lower Extremity Assessment: RLE deficits/detail;LLE deficits/detail RLE: Unable to fully assess due to pain LLE: Unable to fully assess due to pain    Cervical / Trunk Assessment Cervical / Trunk Assessment: Other exceptions Cervical / Trunk Exceptions: unable to assess, hx of scoliosis  Communication   Communication: Expressive difficulties  Cognition Arousal/Alertness: Awake/alert Behavior During Therapy: WFL for tasks assessed/performed Overall Cognitive Status: No family/caregiver present to determine baseline cognitive functioning                                 General Comments: History of dementia. Has some memory issues. Patient has impaired insight into his deficits.      General Comments      Exercises     Assessment/Plan    PT Assessment Patient needs continued PT services  PT Problem List Decreased strength;Decreased range of motion;Decreased activity tolerance;Decreased balance;Decreased mobility;Decreased knowledge of use of DME;Decreased cognition;Pain;Decreased safety awareness;Decreased knowledge of precautions       PT Treatment Interventions DME instruction;Functional mobility training;Therapeutic activities;Therapeutic exercise;Balance training;Patient/family education;Cognitive remediation    PT Goals (Current goals can be found in the Care Plan section)  Acute Rehab PT Goals Patient Stated Goal: To go home, not to go back to rehab PT Goal Formulation: With patient Time For Goal Achievement: 06/30/20 Potential to Achieve Goals: Fair    Frequency Min 2X/week   Barriers to discharge        Co-evaluation PT/OT/SLP Co-Evaluation/Treatment: Yes Reason for Co-Treatment: For patient/therapist safety;To address functional/ADL transfers PT goals addressed during session: Mobility/safety with mobility OT goals addressed during session: ADL's and self-care       AM-PAC PT "6 Clicks" Mobility  Outcome Measure Help needed  turning from your back to your side while in a flat bed without using bedrails?: A Lot Help needed moving from lying on your back to sitting on the side of a flat bed without using bedrails?: A Lot Help needed moving to and from a bed to a chair (including a wheelchair)?: A Lot Help needed standing up from a chair using your arms (e.g., wheelchair or bedside chair)?: Total Help needed to walk in hospital room?: Total Help needed climbing 3-5 steps with a railing? : Total 6 Click Score: 9    End of Session   Activity Tolerance: Patient limited by pain;Other (comment) (anxiety/FOF) Patient left: in bed Nurse Communication: Mobility status;Need for lift equipment PT Visit Diagnosis: Difficulty in walking, not elsewhere classified (R26.2);Muscle weakness (generalized) (M62.81);Pain Pain - part of body:  (bil feet)    Time: 5681-2751 PT Time Calculation (min) (ACUTE ONLY): 16 min   Charges:   PT Evaluation $PT Eval Moderate Complexity: 1 Mod          [place]  Pager [phone] Office [phone]   Mouhamad Teed 06/16/2020, 4:50 PM

## 2020-06-16 NOTE — Consult Note (Signed)
WOC Nurse Consult Note: Patient receiving care in [place] Reason for Consult: Bilateral leg ulcers Wound type: Chronic bilateral leg ulcers, both legs were wrapped with Una boots. These boots were removed with much pain from the patient. Bilateral toes are red, erythematous, friable and very painful to touch. Patient states he is being seen at the [facility] wound clinic and they are putting silver on his toes. Lower extremities are very dry with two small ulcers (each 0.5 cm x 0.5 cm), one on the left extremity and one on the right. Bilateral hemosiderin staining.   Dressing procedure/placement/frequency:  Clean both feet by squeezing warm water over and gently pat dry. Apply Aquacel Advantange Kellie Simmering # [phone]) over bilateral toes and weave through the left 2nd and 3rd toe and through the toes of the right foot. Cover with cut ABD pad. Apply Xeroform Kellie Simmering # [id number]) over lower extremities bilaterally and wrap both legs from the toes to just below the knees with Kerlix and then Ace Edythe Lynn Kellie Simmering # [phone]). Change daily. This dressing change will require two nurses or a nurse and a tech. Premedicate with pain medications before procedure.   Monitor the wound area(s) for worsening of condition such as: Signs/symptoms of infection, increase in size, development of or worsening of odor, development of pain, or increased pain at the affected locations.   Notify the medical team if any of these develop.  Thank you for the consult. [place] nurse will not follow at this time.   Please re-consult the [facility] team if needed.  Cathlean Marseilles Tamala Julian, MSN, RN, [place], [place] Pearl, [facility] Wound Treatment Associate Pager [phone]

## 2020-06-16 NOTE — Evaluation (Signed)
Occupational Therapy Evaluation Patient Details Name: Shaun Yoder MRN: [id number] DOB: 10-06-1945 Today's Date: 06/16/2020    History of Present Illness 74 y.o. male with medical history significant of scoliosis, debility, Pressure ulcers, PAD, aortic stenosis, bladder Ca, Colitis, DVT, OSA, schozophrenia, peripheral Neuropathy, rheumatoid arthritis, chronic respiratory failure lower extremity lymphedema, memory loss. Presented to ED with shortness of breath and found to be hypoxic.   Clinical Impression   Mr. Shaun Yoder is a 74 year old man admitted to hospital with hypoxia after being found to have increased shortness of breath. Patient has been living at local SNF but had been at home with family when found to be short of breath per chart. According to patient he requires assistance of two to transfer to his motorized wheelchair but details regarding ADLs are limited - he reports he is able to assist with upper body bathing and able to feed himself but difficulty with grooming due to limited shoulder ROM. On evaluation patient limited to bed evaluation due to patient refusing to transfer due to fear of falling and pain from wounds on bilateral feet. Patient exhibits poor shoulder ROM but functional strength of elbow, wrist and hand. Patient max assist to roll left and right. Patient total assist for toileting and lower body dressing at bed level, max assist for lower body bathing, mod assist for ub bathing and dressing. Patient reports wanting to go home at discharge and not return to SNF. However, patient needs 24/7 heavy physical assistance at home that cannot be provided by his wife. Therapist recommends return to SNF. Patient will benefit from skilled OT services while in hospital to improve patient's ability to transfer and participate in self care tasks in order to reduce caregiver burden.     Follow Up Recommendations  SNF    Equipment Recommendations  None recommended by OT     Recommendations for Other Services       Precautions / Restrictions Precautions Precautions: Fall Precaution Comments: wounds on legs, feet, very tender (don't touch if you can help it) Restrictions Weight Bearing Restrictions: No      Mobility Bed Mobility Overal bed mobility: Needs Assistance Bed Mobility: Rolling Rolling: Max assist         General bed mobility comments: Max assist to roll left and right, patient assisting with use of bed rails. Patient refusing transfer into sitting reporting he will fall.    Transfers                      Balance                                           ADL either performed or assessed with clinical judgement   ADL Overall ADL's : Needs assistance/impaired Eating/Feeding: Set up;Bed level   Grooming: Modified independent;Bed level;Set up   Upper Body Bathing: Moderate assistance;Bed level   Lower Body Bathing: Maximal assistance;Bed level;Set up   Upper Body Dressing : Moderate assistance;Bed level   Lower Body Dressing: Total assistance;Bed level;+2 for physical assistance     Toilet Transfer Details (indicate cue type and reason): unable Toileting- Clothing Manipulation and Hygiene: Total assistance;+2 for physical assistance;Bed level               Vision Patient Visual Report: No change from baseline       Perception  Praxis      Pertinent Vitals/Pain Pain Assessment: Faces Faces Pain Scale: Hurts whole lot Pain Location: Bil feet - with touching Pain Descriptors / Indicators: Burning;Grimacing Pain Intervention(s): Limited activity within patient's tolerance     Hand Dominance Right   Extremity/Trunk Assessment Upper Extremity Assessment Upper Extremity Assessment: RUE deficits/detail;LUE deficits/detail RUE Deficits / Details: Shoulder 2+/5, elbow 4/5, wrist 5/5, grip 4/5 RUE Sensation: WNL RUE Coordination: WNL LUE Deficits / Details: Shoulder 2+/5, elbow  4/5, wrist 5/5, grip 4/5 LUE Sensation: WNL LUE Coordination: WNL   Lower Extremity Assessment Lower Extremity Assessment: Defer to PT evaluation   Cervical / Trunk Assessment Cervical / Trunk Assessment: Other exceptions Cervical / Trunk Exceptions: unable to assess, hx of scoliosis   Communication Communication Communication: Expressive difficulties   Cognition Arousal/Alertness: Awake/alert Behavior During Therapy: WFL for tasks assessed/performed Overall Cognitive Status: No family/caregiver present to determine baseline cognitive functioning                                 General Comments: History of dementia. Has some memory issues. Patient has impaired insight into his deficits.   General Comments       Exercises     Shoulder Instructions      Home Living Family/patient expects to be discharged to:: Skilled nursing facility                                 Additional Comments: Patient has been at SNF. Non ambulatory at baseline. Was using power chair at home. From prior chart review patient and family waiting on hoyer lift from [place] and wife was his caregiver. Wife reporting she cannot take care of him.      Prior Functioning/Environment Level of Independence: Needs assistance  Gait / Transfers Assistance Needed: Reports +2 assistance for wc transfers. ADL's / Homemaking Assistance Needed: Assistance needed. Details limited. Patient continues to report his abilities from home and not his recent abilities. Reports able to feed himself but barely able to wash face and hair due to poor shoulder ROM. Communication / Swallowing Assistance Needed: Mumbled speech at times but mostly coherent.          OT Problem List: Decreased strength;Decreased range of motion;Decreased activity tolerance;Impaired balance (sitting and/or standing);Decreased knowledge of use of DME or AE;Decreased cognition;Impaired UE functional use;Pain;Cardiopulmonary status  limiting activity      OT Treatment/Interventions: Self-care/ADL training;Therapeutic exercise;DME and/or AE instruction;Therapeutic activities;Balance training;Patient/family education    OT Goals(Current goals can be found in the care plan section) Acute Rehab OT Goals Patient Stated Goal: To go home, not to go back to rehab OT Goal Formulation: With patient Time For Goal Achievement: 06/30/20 Potential to Achieve Goals: Fair  OT Frequency: Min 2X/week   Barriers to D/C:            Co-evaluation PT/OT/SLP Co-Evaluation/Treatment: Yes Reason for Co-Treatment: For patient/therapist safety;To address functional/ADL transfers (co-eval) PT goals addressed during session: Mobility/safety with mobility OT goals addressed during session: ADL's and self-care;Strengthening/ROM      AM-PAC OT "6 Clicks" Daily Activity     Outcome Measure Help from another person eating meals?: A Little Help from another person taking care of personal grooming?: A Lot Help from another person toileting, which includes using toliet, bedpan, or urinal?: Total Help from another person bathing (including washing, rinsing, drying)?: A Lot Help  from another person to put on and taking off regular upper body clothing?: A Lot Help from another person to put on and taking off regular lower body clothing?: Total 6 Click Score: 11   End of Session Nurse Communication: Mobility status  Activity Tolerance: Patient tolerated treatment well Patient left: in bed;with call bell/phone within reach;with bed alarm set  OT Visit Diagnosis: Muscle weakness (generalized) (M62.81);Pain Pain - Right/Left:  (bilateral) Pain - part of body: Ankle and joints of foot                Time: 0093-8182 OT Time Calculation (min): 18 min Charges:  OT General Charges $OT Visit: 1 Visit OT Evaluation $OT Eval Moderate Complexity: 1 Mod  Tonika Eden, OTR/L [place]  Office [phone] Pager: [place] 06/16/2020, 4:24 PM

## 2020-06-16 NOTE — Progress Notes (Signed)
PROGRESS NOTE    Shaun Yoder  GMW:[id number] DOB: [birth year] DOA: 06/16/2020 PCP: Colon Branch, MD   Brief Narrative:  ADEWALE Yoder is a 74 y.o. male with medical history significant of scoliosis, debility, Pressure ulcers, PAD, aortic stenosis, bladder Ca, Colitis, DVT, OSA, schozophrenia, peripheral Neuropathy, rheumatoid arthritis, chronic respiratory failure lower extremity lymphedema, memory loss. Presented with patient normally resides at the nursing facility where he is wheelchair-bound.  He was visiting his wife for Thanksgiving. Family noted that he was having more shortness of breath and some wheezing he is usually on few liters of oxygen EMS was called he was satting 60% while using his oxygen tank. He was switched to EMS oxygen supply and his oxygenation improved to mid 80s on 2 L he was placed on 4 L going up to the 90s. Was provided albuterol and Atrovent nebulizer. Per wife pt has known hx of severe aortic stenosis but have refused care in the past. He has been more SOB for the past 2 wk requiring O2 at his Nursing home. He has been also more sleepy he typically falls asleep during conversations. Reports wheezing, no fever no chest pain   Assessment & Plan:   Active Problems:   Aortic stenosis, severe   Sleep apnea   GERD (gastroesophageal reflux disease)   Schizoaffective disorder, bipolar type (HCC)   Rheumatoid arthritis ([place])   Hyperlipidemia   Hypertension   Left leg DVT (HCC)   Acute on chronic respiratory failure with hypoxia and hypercapnia (HCC)   Acute on chronic diastolic CHF (congestive heart failure) (HCC)   Acute on chronic respiratory failure with hypoxia and hypercapnia secondary to HF exacerbation Wheezing could be cardiac in nature.  Questionable partial response to albuterol - may benefit from outpatient PFTs with pulmonology (no known history of COPD, but reports 40+ pack year history) Discussed use of BiPAP patient agreeable to use as  needed Patient does not wish any aggressive interventions (OK with bipap, no CPR/Intubation) Patient reports no oxygen use months ago, occasionally on 2 L now with exertion Continue prednisone to 20 twice daily  Acute on chronic diastolic CHF (congestive heart failure) (HCC)  Continue diuresis -follow I's and O's  Echocardiogram pending  Creatinine stable   Sleep apnea -reportedly noncompliant with CPAP - has had trouble scheduling appointment may need help with this at the time of discharge  Schizoaffective disorder, bipolar type ([place]) continue Latuda and Depakote Depakote level within normal limits  Rheumatoid arthritis (HCC) continue prednisone given possibly reactive airway disease flexion increase the dose  Left leg DVT ([place]) -continue Eliquis  Hypertension -monitor blood pressure while being diuresed  Hyperlipidemia -not on statins at baseline; questionable tolerance issues  GERD (gastroesophageal reflux disease) chronic continue home meds  Lower extremity wounds -wound care consult appreciated patient has been receiving wound care while at the nursing home  Elevated troponin; type 2 supply/demand mismatch given hypoxia  -in the setting of CHF exacerbation no reports of chest pain continue to monitor obtain echogram continue cycle Patient not interested in aggressive interventions  Hypoalbuminemia we will check prealbumin and nutritional consult Given chronic wounds Other plan as per orders.  DVT prophylaxis:   Eliquis Code Status: DNR Family Communication: None present, unavailable by phone  Status is: Inpatient  Dispo: The patient is from: Facility              Anticipated d/c is to: Same  Anticipated d/c date is: 72 to 96 hours pending clinical course              Patient currently not medically stable for discharge  Consultants:   None  Procedures:   None  Antimicrobials:  None  Subjective: No acute issues or events  overnight, patient feels markedly improved, denies chest pain, fever, chills, cough, sputum production.  Does endorse ongoing dyspnea even at rest but markedly improving from previous.  Objective: Vitals:   06/16/20 0400 06/16/20 0453 06/16/20 0500 06/16/20 0600  BP: 100/76  100/74   Pulse: 80  81 73  Resp: (!) 30  (!) 26 (!) 22  Temp: 97.9 F (36.6 C)     TempSrc: Oral     SpO2: 95%  100% 100%  Weight:  78 kg    Height:        Intake/Output Summary (Last 24 hours) at 06/16/2020 0709 Last data filed at 06/16/2020 0600 Gross per 24 hour  Intake 240 ml  Output --  Net 240 ml   Filed Weights   06/12/2020 1422 06/16/20 0453  Weight: 76.5 kg 78 kg    Examination:  General:  Pleasantly resting in bed, No acute distress. HEENT:  Normocephalic atraumatic.  Sclerae nonicteric, noninjected.  Extraocular movements intact bilaterally. Neck:  Without mass or deformity.  Trachea is midline. Lungs: Rhonchorous breath sounds bilaterally, bibasilar rales, scant end expiratory wheezes noted Heart:  Regular rate and rhythm.  Without murmurs, rubs, or gallops. Abdomen:  Soft, nontender, nondistended.  Without guarding or rebound. Extremities: Without cyanosis, clubbing; 1-2+ pitting edema bilateral lower extremities Vascular:  Dorsalis pedis and posterior tibial pulses palpable bilaterally. Skin:  Warm and dry, no erythema, multiple excoriations skin lesions noted on bilateral lower extremities most notably at the toes again bilaterally, see wound care documentation for details    Data Reviewed: I have personally reviewed following labs and imaging studies  CBC: Recent Labs  Lab 05/31/2020 1439 06/16/20 0256  WBC 5.8 3.4*  NEUTROABS 4.4 3.0  HGB 12.0* 11.5*  HCT 39.2 38.4*  MCV 96.8 95.0  PLT 140* 892*   Basic Metabolic Panel: Recent Labs  Lab 06/17/2020 1439 05/29/2020 1909 06/16/20 0256  NA 137  --  135  K 4.9  --  4.5  CL 96*  --  96*  CO2 33*  --  31  GLUCOSE 89  --  110*   BUN 24*  --  24*  CREATININE 0.62  --  0.55*  CALCIUM 8.6*  --  8.4*  MG 1.8  --  1.7  PHOS  --  3.6 4.1   GFR: Estimated Creatinine Clearance: 71.7 mL/min (A) (by C-G formula based on SCr of 0.55 mg/dL (L)). Liver Function Tests: Recent Labs  Lab 06/02/2020 1439 06/16/20 0256  AST 28 26  ALT 19 16  ALKPHOS 87 75  BILITOT 0.9 1.2  PROT 7.3 6.5  ALBUMIN 3.0* 2.7*   No results for input(s): LIPASE, AMYLASE in the last 168 hours. No results for input(s): AMMONIA in the last 168 hours. Coagulation Profile: Recent Labs  Lab 06/09/2020 1444  INR 1.1   Cardiac Enzymes: Recent Labs  Lab 06/19/2020 1444  CKTOTAL 54   BNP (last 3 results) No results for input(s): PROBNP in the last 8760 hours. HbA1C: No results for input(s): HGBA1C in the last 72 hours. CBG: No results for input(s): GLUCAP in the last 168 hours. Lipid Profile: No results for input(s): CHOL, HDL, LDLCALC, TRIG, CHOLHDL, LDLDIRECT  in the last 72 hours. Thyroid Function Tests: Recent Labs    06/13/2020 1444  TSH 0.920   Anemia Panel: No results for input(s): VITAMINB12, FOLATE, FERRITIN, TIBC, IRON, RETICCTPCT in the last 72 hours. Sepsis Labs: No results for input(s): PROCALCITON, LATICACIDVEN in the last 168 hours.  Recent Results (from the past 240 hour(s))  Resp Panel by RT-PCR (Flu A&B, Covid) Nasopharyngeal Swab     Status: None   Collection Time: 06/01/2020  2:44 PM   Specimen: Nasopharyngeal Swab; Nasopharyngeal(NP) swabs in vial transport medium  Result Value Ref Range Status   SARS Coronavirus 2 by RT PCR NEGATIVE NEGATIVE Final    Comment: (NOTE) SARS-CoV-2 target nucleic acids are NOT DETECTED.  The SARS-CoV-2 RNA is generally detectable in upper respiratory specimens during the acute phase of infection. The lowest concentration of SARS-CoV-2 viral copies this assay can detect is 138 copies/mL. A negative result does not preclude SARS-Cov-2 infection and should not be used as the sole basis  for treatment or other patient management decisions. A negative result may occur with  improper specimen collection/handling, submission of specimen other than nasopharyngeal swab, presence of viral mutation(s) within the areas targeted by this assay, and inadequate number of viral copies(<138 copies/mL). A negative result must be combined with clinical observations, patient history, and epidemiological information. The expected result is Negative.  Fact Sheet for Patients:  EntrepreneurPulse.com.au  Fact Sheet for Healthcare Providers:  IncredibleEmployment.be  This test is no t yet approved or cleared by the [place] FDA and  has been authorized for detection and/or diagnosis of SARS-CoV-2 by FDA under an Emergency Use Authorization (EUA). This EUA will remain  in effect (meaning this test can be used) for the duration of the COVID-19 declaration under Section 564(b)(1) of the Act, 21 U.S.C.section 360bbb-3(b)(1), unless the authorization is terminated  or revoked sooner.       Influenza A by PCR NEGATIVE NEGATIVE Final   Influenza B by PCR NEGATIVE NEGATIVE Final    Comment: (NOTE) The Xpert Xpress SARS-CoV-2/FLU/RSV plus assay is intended as an aid in the diagnosis of influenza from Nasopharyngeal swab specimens and should not be used as a sole basis for treatment. Nasal washings and aspirates are unacceptable for Xpert Xpress SARS-CoV-2/FLU/RSV testing.  Fact Sheet for Patients: EntrepreneurPulse.com.au  Fact Sheet for Healthcare Providers: IncredibleEmployment.be  This test is not yet approved or cleared by the [place] FDA and has been authorized for detection and/or diagnosis of SARS-CoV-2 by FDA under an Emergency Use Authorization (EUA). This EUA will remain in effect (meaning this test can be used) for the duration of the COVID-19 declaration under Section 564(b)(1) of the Act, 21  U.S.C. section 360bbb-3(b)(1), unless the authorization is terminated or revoked.  Performed at [facility], [place] [address], [place], [place] [address]   MRSA PCR Screening     Status: Abnormal   Collection Time: 06/19/2020 11:21 PM   Specimen: Nasal Mucosa; Nasopharyngeal  Result Value Ref Range Status   MRSA by PCR POSITIVE (A) NEGATIVE Final    Comment:        The GeneXpert MRSA Assay (FDA approved for NASAL specimens only), is one component of a comprehensive MRSA colonization surveillance program. It is not intended to diagnose MRSA infection nor to guide or monitor treatment for MRSA infections. CRITICAL RESULT CALLED TO, READ BACK BY AND VERIFIED WITH: RN O SCHIES AT 0157 06/16/20 Happy A Performed at [facility], [address] [place],  [place] [address]          Radiology Studies: [facility] Chest Port 1 View  Result Date: 05/26/2020 CLINICAL DATA:  Shortness of breath. EXAM: PORTABLE CHEST 1 VIEW COMPARISON:  11/25/2019 FINDINGS: The heart is enlarged. Moderate tortuosity of the thoracic aorta. Perihilar central vascular congestion and fairly fulminant pattern of perihilar pulmonary edema. There are also bilateral pleural effusions. Right lower lobe atelectasis versus is infiltrate/pneumonia. Stable severe degenerative changes involving both shoulder joints. IMPRESSION: CHF with bilateral pleural effusions. Probable bibasilar atelectasis. Could not exclude a right lower lobe infiltrate. Electronically Signed   By: Marijo Sanes M.D.   On: 06/05/2020 14:54        Scheduled Meds: . apixaban  2.5 mg Oral BID  . aspirin EC  81 mg Oral Daily  . Chlorhexidine Gluconate Cloth  6 each Topical Daily  . divalproex  1,000 mg Oral QHS  . furosemide  40 mg Intravenous Daily  . guaiFENesin  600 mg Oral BID  . ipratropium-albuterol  3 mL Nebulization Q6H  . lurasidone  80 mg Oral QHS  . mouth rinse  15 mL Mouth Rinse BID  .  mirabegron ER  25 mg Oral Daily  . mupirocin ointment  1 application Nasal BID  . oxybutynin  10 mg Oral QHS  . pantoprazole  40 mg Oral Daily  . predniSONE  20 mg Oral BID WC  . sodium chloride flush  3 mL Intravenous Q12H   Continuous Infusions: . sodium chloride       LOS: 1 day   Time spent: 37min  Valton Schwartz C Irelyn Perfecto, DO [facility]  If 7PM-7AM, please contact night-coverage [email or web address]  06/16/2020, 7:09 AM

## 2020-06-16 NOTE — Progress Notes (Signed)
  Echocardiogram 2D Echocardiogram has been performed.  Shaun Yoder 06/16/2020, 12:13 PM

## 2020-06-17 ENCOUNTER — Inpatient Hospital Stay (HOSPITAL_COMMUNITY): Payer: Medicare Other

## 2020-06-17 DIAGNOSIS — J9621 Acute and chronic respiratory failure with hypoxia: Secondary | ICD-10-CM | POA: Diagnosis not present

## 2020-06-17 DIAGNOSIS — I5033 Acute on chronic diastolic (congestive) heart failure: Secondary | ICD-10-CM | POA: Diagnosis not present

## 2020-06-17 DIAGNOSIS — I350 Nonrheumatic aortic (valve) stenosis: Secondary | ICD-10-CM | POA: Diagnosis not present

## 2020-06-17 DIAGNOSIS — E785 Hyperlipidemia, unspecified: Secondary | ICD-10-CM | POA: Diagnosis not present

## 2020-06-17 LAB — CBC
HCT: 37.8 % — ABNORMAL LOW (ref 39.0–52.0)
Hemoglobin: 11.6 g/dL — ABNORMAL LOW (ref 13.0–17.0)
MCH: 28.6 pg (ref 26.0–34.0)
MCHC: 30.7 g/dL (ref 30.0–36.0)
MCV: 93.3 fL (ref 80.0–100.0)
Platelets: 120 10*3/uL — ABNORMAL LOW (ref 150–400)
RBC: 4.05 MIL/uL — ABNORMAL LOW (ref 4.22–5.81)
RDW: 16 % — ABNORMAL HIGH (ref 11.5–15.5)
WBC: 4.9 10*3/uL (ref 4.0–10.5)
nRBC: 0 % (ref 0.0–0.2)

## 2020-06-17 LAB — BASIC METABOLIC PANEL
Anion gap: 10 (ref 5–15)
BUN: 29 mg/dL — ABNORMAL HIGH (ref 8–23)
CO2: 30 mmol/L (ref 22–32)
Calcium: 8.4 mg/dL — ABNORMAL LOW (ref 8.9–10.3)
Chloride: 94 mmol/L — ABNORMAL LOW (ref 98–111)
Creatinine, Ser: 0.5 mg/dL — ABNORMAL LOW (ref 0.61–1.24)
GFR, Estimated: >60 mL/min (ref >60)
Glucose, Bld: 120 mg/dL — ABNORMAL HIGH (ref 70–99)
Potassium: 4.1 mmol/L (ref 3.5–5.1)
Sodium: 134 mmol/L — ABNORMAL LOW (ref 135–145)

## 2020-06-17 MED ORDER — DILTIAZEM HCL 25 MG/5ML IV SOLN
5.0000 mg | Freq: Once | INTRAVENOUS | Status: AC
  Administered 2020-06-17: 5 mg via INTRAVENOUS
  Filled 2020-06-17: qty 5

## 2020-06-17 MED ORDER — IPRATROPIUM BROMIDE 0.02 % IN SOLN
0.5000 mg | RESPIRATORY_TRACT | Status: DC | PRN
  Administered 2020-06-19: 0.5 mg via RESPIRATORY_TRACT
  Filled 2020-06-17: qty 2.5

## 2020-06-17 MED ORDER — FUROSEMIDE 10 MG/ML IJ SOLN
60.0000 mg | Freq: Two times a day (BID) | INTRAMUSCULAR | Status: DC
  Administered 2020-06-17 – 2020-06-18 (×3): 60 mg via INTRAVENOUS
  Filled 2020-06-17 (×4): qty 6

## 2020-06-17 NOTE — Progress Notes (Addendum)
Pt was having an irregular HR. I did an EKG and pt was in atrial fib with RVR. Pt was SOB, HR 120, anxious and some confusion. Dr. Avon Gully was made aware. Pt was placed on a salter at 10 L. This nurse will continue to monitor

## 2020-06-17 NOTE — Plan of Care (Signed)

## 2020-06-17 NOTE — Progress Notes (Signed)
PROGRESS NOTE    Shaun Yoder  DSK:[id number] DOB: [birth year] DOA: 06/05/2020 PCP: Colon Branch, MD   Brief Narrative:  Shaun Yoder is a 74 y.o. male with medical history significant of scoliosis, debility, Pressure ulcers, PAD, aortic stenosis, bladder Ca, Colitis, DVT, OSA, schozophrenia, peripheral Neuropathy, rheumatoid arthritis, chronic respiratory failure lower extremity lymphedema, memory loss. Presented with patient normally resides at the nursing facility where he is wheelchair-bound.  He was visiting his wife for Thanksgiving. Family noted that he was having more shortness of breath and some wheezing he is usually on few liters of oxygen EMS was called he was satting 60% while using his oxygen tank. He was switched to EMS oxygen supply and his oxygenation improved to mid 80s on 2 L he was placed on 4 L going up to the 90s. Was provided albuterol and Atrovent nebulizer. Per wife pt has known hx of severe aortic stenosis but have refused care in the past. He has been more SOB for the past 2 wk requiring O2 at his Nursing home. He has been also more sleepy he typically falls asleep during conversations. Reports wheezing, no fever no chest pain.  Admitted for acute hypoxia in setting of presumed heart failure exacerbation.   Assessment & Plan:   Active Problems:   Aortic stenosis, severe   Sleep apnea   GERD (gastroesophageal reflux disease)   Schizoaffective disorder, bipolar type (HCC)   Rheumatoid arthritis (HCC)   Hyperlipidemia   Hypertension   Left leg DVT (HCC)   Acute on chronic respiratory failure with hypoxia and hypercapnia (HCC)   Acute on chronic diastolic CHF (congestive heart failure) (HCC)  Acute on chronic respiratory failure with hypoxia and hypercapnia secondary to HF exacerbation Wheezing continues, likely cardiac in nature Questionable response from DuoNebs, transition to ipratropium only given tachycardia as below -would benefit from outpatient  PFTs with pulmonology (no known history of COPD, but reports 40+ pack year history) Discussed use of BiPAP patient agreeable to use as needed Patient does not wish any aggressive interventions (OK with bipap, no CPR/Intubation) Patient reports no oxygen use months ago, occasionally on 2 L now with exertion over the past few weeks Discontinue prednisone to 20 twice daily given rapid A. fib as below  Provoked A. fib, currently in RVR  Likely provoked given above hypoxia and respiratory distress EKG consistent with A. fib this morning Diltiazem x1 IV dose 5 mg given today with rapid improvement Continue diuresing and supportive care as above Discontinue steroids, albuterol transition to ipratropium only  Acute on chronic diastolic CHF (congestive heart failure) (HCC)  Increase Lasix to 60 mg IV twice daily, follow creatinine closely I's and O's poorly documented over the past 24 hours - only reporting 150 cc of urine output Echocardiogram on 06/16/2020 shows EF of 30 to 35% with severely decreased function global hypokinesis and left ventricular dilation.  Right ventricle function moderately reduced with severely elevated pulmonary artery systolic pressure at 62.0.  Sleep apnea -reportedly noncompliant with CPAP  Schizoaffective disorder, bipolar type (HCC)  Continue Latuda and Depakote Depakote level within normal limits at admission  Rheumatoid arthritis ([place])  Currently holding prednisone given rapid A. fib and RVR as above -resume once heart rate is well controlled  Left leg DVT ([place]) -continue Eliquis  Hypertension -monitor blood pressure while being diuresed  Hyperlipidemia -not on statins at baseline; questionable tolerance issues  GERD (gastroesophageal reflux disease) chronic continue home meds  Lower extremity wounds -wound care consult  appreciated patient has been receiving wound care while at the nursing home  Elevated troponin; type 2 supply/demand mismatch  given hypoxia  -in the setting of CHF exacerbation no reports of chest pain continue to monitor obtain echogram continue cycle Patient not interested in aggressive interventions  Hypoalbuminemia  Nutritional consult following  DVT prophylaxis:   Eliquis Code Status: DNR Family Communication: None present, wife unavailable by phone  Status is: Inpatient  Dispo: The patient is from: SNF              Anticipated d/c is to: Same              Anticipated d/c date is: 72 to 96 hours pending clinical course              Patient currently not medically stable for discharge  Consultants:   None  Procedures:   None  Antimicrobials:  None  Subjective: No acute issues or events overnight, patient had episode of rapid A. fib this morning tachycardic in the 120s 130s, patient states his respiratory status is "about the same as yesterday" despite overt tachypnea and tachycardia.  He otherwise denies chest pain, headache, fevers, chills.  He continues to request removal of Unna boots, however he is not wearing Unna boots at this time.  Objective: Vitals:   06/17/20 0210 06/17/20 0300 06/17/20 0400 06/17/20 0500  BP:   114/81   Pulse:  90 94 85  Resp:  (!) 28 (!) 26 (!) 28  Temp:    98.3 F (36.8 C)  TempSrc:    Oral  SpO2: 93% 98% 96% 99%  Weight:      Height:        Intake/Output Summary (Last 24 hours) at 06/17/2020 0724 Last data filed at 06/16/2020 1400 Gross per 24 hour  Intake 360 ml  Output --  Net 360 ml   Filed Weights   06/13/2020 1422 06/16/20 0453  Weight: 76.5 kg 78 kg    Examination:  General:  Pleasantly resting in bed, No acute distress.  Somewhat somnolent but oriented to person HEENT:  Normocephalic atraumatic.  Sclerae nonicteric, noninjected.  Extraocular movements intact bilaterally. Neck:  Without mass or deformity.  Trachea is midline. Lungs: Rhonchorous breath sounds bilaterally, bibasilar rales, moderate expiratory wheeze bilateral  Heart:   Regular rate and rhythm.  Without murmurs, rubs, or gallops. Abdomen:  Soft, nontender, nondistended.  Without guarding or rebound. Extremities: Without cyanosis, clubbing; 1-2+ pitting edema bilateral lower extremities Vascular:  Dorsalis pedis and posterior tibial pulses palpable bilaterally. Skin:  Warm and dry, no erythema, multiple excoriations skin lesions noted on bilateral lower extremities most notably at the toes again bilaterally, see wound care documentation for details  Data Reviewed: I have personally reviewed following labs and imaging studies  CBC: Recent Labs  Lab 05/23/2020 1439 06/16/20 0256 06/17/20 0218  WBC 5.8 3.4* 4.9  NEUTROABS 4.4 3.0  --   HGB 12.0* 11.5* 11.6*  HCT 39.2 38.4* 37.8*  MCV 96.8 95.0 93.3  PLT 140* 117* 814*   Basic Metabolic Panel: Recent Labs  Lab 06/07/2020 1439 06/17/2020 1909 06/16/20 0256 06/17/20 0218  NA 137  --  135 134*  K 4.9  --  4.5 4.1  CL 96*  --  96* 94*  CO2 33*  --  31 30  GLUCOSE 89  --  110* 120*  BUN 24*  --  24* 29*  CREATININE 0.62  --  0.55* 0.50*  CALCIUM 8.6*  --  8.4* 8.4*  MG 1.8  --  1.7  --   PHOS  --  3.6 4.1  --    GFR: Estimated Creatinine Clearance: 71.7 mL/min (A) (by C-G formula based on SCr of 0.5 mg/dL (L)). Liver Function Tests: Recent Labs  Lab 05/29/2020 1439 06/16/20 0256  AST 28 26  ALT 19 16  ALKPHOS 87 75  BILITOT 0.9 1.2  PROT 7.3 6.5  ALBUMIN 3.0* 2.7*   No results for input(s): LIPASE, AMYLASE in the last 168 hours. No results for input(s): AMMONIA in the last 168 hours. Coagulation Profile: Recent Labs  Lab 05/28/2020 1444  INR 1.1   Cardiac Enzymes: Recent Labs  Lab 06/08/2020 1444  CKTOTAL 54   BNP (last 3 results) No results for input(s): PROBNP in the last 8760 hours. HbA1C: No results for input(s): HGBA1C in the last 72 hours. CBG: No results for input(s): GLUCAP in the last 168 hours. Lipid Profile: No results for input(s): CHOL, HDL, LDLCALC, TRIG, CHOLHDL,  LDLDIRECT in the last 72 hours. Thyroid Function Tests: Recent Labs    06/05/2020 1444  TSH 0.920   Anemia Panel: No results for input(s): VITAMINB12, FOLATE, FERRITIN, TIBC, IRON, RETICCTPCT in the last 72 hours. Sepsis Labs: No results for input(s): PROCALCITON, LATICACIDVEN in the last 168 hours.  Recent Results (from the past 240 hour(s))  Resp Panel by RT-PCR (Flu A&B, Covid) Nasopharyngeal Swab     Status: None   Collection Time: 06/14/2020  2:44 PM   Specimen: Nasopharyngeal Swab; Nasopharyngeal(NP) swabs in vial transport medium  Result Value Ref Range Status   SARS Coronavirus 2 by RT PCR NEGATIVE NEGATIVE Final    Comment: (NOTE) SARS-CoV-2 target nucleic acids are NOT DETECTED.  The SARS-CoV-2 RNA is generally detectable in upper respiratory specimens during the acute phase of infection. The lowest concentration of SARS-CoV-2 viral copies this assay can detect is 138 copies/mL. A negative result does not preclude SARS-Cov-2 infection and should not be used as the sole basis for treatment or other patient management decisions. A negative result may occur with  improper specimen collection/handling, submission of specimen other than nasopharyngeal swab, presence of viral mutation(s) within the areas targeted by this assay, and inadequate number of viral copies(<138 copies/mL). A negative result must be combined with clinical observations, patient history, and epidemiological information. The expected result is Negative.  Fact Sheet for Patients:  EntrepreneurPulse.com.au  Fact Sheet for Healthcare Providers:  IncredibleEmployment.be  This test is no t yet approved or cleared by the [place] FDA and  has been authorized for detection and/or diagnosis of SARS-CoV-2 by FDA under an Emergency Use Authorization (EUA). This EUA will remain  in effect (meaning this test can be used) for the duration of the COVID-19 declaration under  Section 564(b)(1) of the Act, 21 U.S.C.section 360bbb-3(b)(1), unless the authorization is terminated  or revoked sooner.       Influenza A by PCR NEGATIVE NEGATIVE Final   Influenza B by PCR NEGATIVE NEGATIVE Final    Comment: (NOTE) The Xpert Xpress SARS-CoV-2/FLU/RSV plus assay is intended as an aid in the diagnosis of influenza from Nasopharyngeal swab specimens and should not be used as a sole basis for treatment. Nasal washings and aspirates are unacceptable for Xpert Xpress SARS-CoV-2/FLU/RSV testing.  Fact Sheet for Patients: EntrepreneurPulse.com.au  Fact Sheet for Healthcare Providers: IncredibleEmployment.be  This test is not yet approved or cleared by the [place] FDA and has been authorized for detection and/or diagnosis of SARS-CoV-2 by  FDA under an Emergency Use Authorization (EUA). This EUA will remain in effect (meaning this test can be used) for the duration of the COVID-19 declaration under Section 564(b)(1) of the Act, 21 U.S.C. section 360bbb-3(b)(1), unless the authorization is terminated or revoked.  Performed at [facility], [place] [address], [place], [place] [address]   MRSA PCR Screening     Status: Abnormal   Collection Time: 06/09/2020 11:21 PM   Specimen: Nasal Mucosa; Nasopharyngeal  Result Value Ref Range Status   MRSA by PCR POSITIVE (A) NEGATIVE Final    Comment:        The GeneXpert MRSA Assay (FDA approved for NASAL specimens only), is one component of a comprehensive MRSA colonization surveillance program. It is not intended to diagnose MRSA infection nor to guide or monitor treatment for MRSA infections. CRITICAL RESULT CALLED TO, READ BACK BY AND VERIFIED WITH: RN O SCHIES AT 0157 06/16/20 [place] A Performed at [facility], [place] [address] [place],  [address]          Radiology Studies: [facility] Chest Port 1 View  Result Date:  05/22/2020 CLINICAL DATA:  Shortness of breath. EXAM: PORTABLE CHEST 1 VIEW COMPARISON:  11/25/2019 FINDINGS: The heart is enlarged. Moderate tortuosity of the thoracic aorta. Perihilar central vascular congestion and fairly fulminant pattern of perihilar pulmonary edema. There are also bilateral pleural effusions. Right lower lobe atelectasis versus is infiltrate/pneumonia. Stable severe degenerative changes involving both shoulder joints. IMPRESSION: CHF with bilateral pleural effusions. Probable bibasilar atelectasis. Could not exclude a right lower lobe infiltrate. Electronically Signed   By: Marijo Sanes M.D.   On: 05/24/2020 14:54   ECHOCARDIOGRAM COMPLETE  Result Date: 06/16/2020    ECHOCARDIOGRAM REPORT   Patient Name:   Shaun Yoder Date of Exam: 06/16/2020 Medical Rec #:  [id number]          Height:       61.0 in Accession #:    [id number]         Weight:       172.0 lb Date of Birth:  July 12, 1946         BSA:          1.771 m Patient Age:    84 years           BP:           100/74 mmHg Patient Gender: M                  HR:           73 bpm. Exam Location:  Inpatient Procedure: 2D Echo, Cardiac Doppler, Color Doppler and Intracardiac            Opacification Agent Indications:    I50.40* Unspecified combined systolic (congestive) and diastolic                 (congestive) heart failure  History:        Patient has prior history of Echocardiogram examinations, most                 recent 12/26/2017. CHF, Aortic Valve Disease,                 Signs/Symptoms:Altered Mental Status; Risk Factors:Sleep Apnea,                 Hypertension and Dyslipidemia. Severe aortic stenosis.  Sonographer:    Roseanna Rainbow RDCS Referring Phys: [place]  Sonographer Comments:  Technically difficult study due to poor echo windows. IMPRESSIONS  1. Left ventricular ejection fraction, by estimation, is 30 to 35%. The left ventricle has moderate to severely decreased function. The left ventricle demonstrates  global hypokinesis. The left ventricular internal cavity size was mildly dilated. Left ventricular diastolic function could not be evaluated.  2. Right ventricular systolic function is moderately reduced. The right ventricular size is mildly enlarged. There is severely elevated pulmonary artery systolic pressure. The estimated right ventricular systolic pressure is 60.4 mmHg.  3. Left atrial size was moderately dilated.  4. The mitral valve is degenerative. Trivial mitral valve regurgitation. No evidence of mitral stenosis. Moderate to severe mitral annular calcification.  5. Tricuspid valve regurgitation is mild to moderate.  6. The aortic valve has an indeterminant number of cusps. There is severe calcifcation of the aortic valve. There is severe thickening of the aortic valve. Aortic valve regurgitation is mild. Severe aortic valve stenosis.  7. The inferior vena cava is dilated in size with <50% respiratory variability, suggesting right atrial pressure of 15 mmHg. Comparison(s): Prior images unable to be directly viewed, comparison made by report only. The left ventricular function is significantly worse. There is now severe pulmonary artery hypertension. FINDINGS  Left Ventricle: Left ventricular ejection fraction, by estimation, is 30 to 35%. The left ventricle has moderate to severely decreased function. The left ventricle demonstrates global hypokinesis. Definity contrast agent was given IV to delineate the left ventricular endocardial borders. The left ventricular internal cavity size was mildly dilated. There is no left ventricular hypertrophy. Left ventricular diastolic function could not be evaluated due to nondiagnostic images. Left ventricular diastolic function could not be evaluated. Right Ventricle: The right ventricular size is mildly enlarged. Right vetricular wall thickness was not well visualized. Right ventricular systolic function is moderately reduced. There is severely elevated pulmonary  artery systolic pressure. The tricuspid regurgitant velocity is 3.76 m/s, and with an assumed right atrial pressure of 15 mmHg, the estimated right ventricular systolic pressure is 54.0 mmHg. Left Atrium: Left atrial size was moderately dilated. Right Atrium: Right atrial size was normal in size. Pericardium: There is no evidence of pericardial effusion. Mitral Valve: The mitral valve is degenerative in appearance. Moderate to severe mitral annular calcification. Trivial mitral valve regurgitation. No evidence of mitral valve stenosis. MV peak gradient, 15.5 mmHg. The mean mitral valve gradient is 5.0 mmHg. Tricuspid Valve: The tricuspid valve is grossly normal. Tricuspid valve regurgitation is mild to moderate. Aortic Valve: The aortic valve has an indeterminant number of cusps. There is severe calcifcation of the aortic valve. There is severe thickening of the aortic valve. Aortic valve regurgitation is mild. Severe aortic stenosis is present. Aortic valve mean gradient measures 67.5 mmHg. Aortic valve peak gradient measures 104.4 mmHg. Aortic valve area, by VTI measures 0.75 cm. Pulmonic Valve: The pulmonic valve was not well visualized. Pulmonic valve regurgitation is not visualized. Aorta: The aortic root and ascending aorta are structurally normal, with no evidence of dilitation. Venous: The inferior vena cava is dilated in size with less than 50% respiratory variability, suggesting right atrial pressure of 15 mmHg. IAS/Shunts: No atrial level shunt detected by color flow Doppler.  LEFT VENTRICLE PLAX 2D LVIDd:         5.60 cm      Diastology LVIDs:         4.50 cm      LV e' medial:    3.26 cm/s LV PW:  1.20 cm      LV E/e' medial:  49.4 LV IVS:        0.90 cm      LV e' lateral:   3.15 cm/s LVOT diam:     2.50 cm      LV E/e' lateral: 51.1 LV SV:         100 LV SV Index:   57 LVOT Area:     4.91 cm  LV Volumes (MOD) LV vol d, MOD A2C: 120.0 ml LV vol d, MOD A4C: 119.0 ml LV vol s, MOD A2C: 85.2 ml  LV vol s, MOD A4C: 78.7 ml LV SV MOD A2C:     34.8 ml LV SV MOD A4C:     119.0 ml LV SV MOD BP:      35.3 ml RIGHT VENTRICLE TAPSE (M-mode): 2.2 cm LEFT ATRIUM           Index       RIGHT ATRIUM           Index LA diam:      4.30 cm 2.43 cm/m  RA Area:     13.70 cm LA Vol (A2C): 69.4 ml 39.18 ml/m RA Volume:   29.90 ml  16.88 ml/m LA Vol (A4C): 72.0 ml 40.65 ml/m  AORTIC VALVE AV Area (Vmax):    0.82 cm AV Area (Vmean):   0.75 cm AV Area (VTI):     0.75 cm AV Vmax:           511.00 cm/s AV Vmean:          392.000 cm/s AV VTI:            1.335 m AV Peak Grad:      104.4 mmHg AV Mean Grad:      67.5 mmHg LVOT Vmax:         85.80 cm/s LVOT Vmean:        60.000 cm/s LVOT VTI:          0.204 m LVOT/AV VTI ratio: 0.15  AORTA Ao Root diam: 3.30 cm Ao Asc diam:  3.80 cm MITRAL VALVE                TRICUSPID VALVE MV Area (PHT): 6.32 cm     TR Peak grad:   56.6 mmHg MV Peak grad:  15.5 mmHg    TR Vmax:        376.00 cm/s MV Mean grad:  5.0 mmHg MV Vmax:       1.97 m/s     SHUNTS MV Vmean:      93.3 cm/s    Systemic VTI:  0.20 m MV Decel Time: 120 msec     Systemic Diam: 2.50 cm MV E velocity: 161.00 cm/s Dani Gobble Croitoru MD Electronically signed by Sanda Klein MD Signature Date/Time: 06/16/2020/12:57:56 PM    Final    Scheduled Meds: . (feeding supplement) PROSource Plus  30 mL Oral Daily  . apixaban  2.5 mg Oral BID  . aspirin EC  81 mg Oral Daily  . Chlorhexidine Gluconate Cloth  6 each Topical Daily  . divalproex  1,000 mg Oral QHS  . furosemide  40 mg Intravenous Daily  . guaiFENesin  600 mg Oral BID  . ipratropium-albuterol  3 mL Nebulization Q6H  . lurasidone  80 mg Oral QHS  . mouth rinse  15 mL Mouth Rinse BID  . mirabegron ER  25 mg Oral Daily  . mupirocin ointment  1 application  Nasal BID  . nutrition supplement (JUVEN)  1 packet Oral BID BM  . oxybutynin  10 mg Oral QHS  . pantoprazole  40 mg Oral Daily  . predniSONE  20 mg Oral BID WC  . sodium chloride flush  3 mL Intravenous Q12H    Continuous Infusions: . sodium chloride       LOS: 2 days   Time spent: 46min  Raiven Belizaire C Shlome Baldree, DO [facility]  If 7PM-7AM, please contact night-coverage [email or web address]  06/17/2020, 7:24 AM

## 2020-06-17 NOTE — Evaluation (Addendum)
Clinical/Bedside Swallow Evaluation Patient Details  Name: Shaun Yoder MRN: [id number] Date of Birth: 01/30/1946  Today's Date: 06/17/2020 Time: SLP Start Time (ACUTE ONLY): 1143 SLP Stop Time (ACUTE ONLY): 1201 SLP Time Calculation (min) (ACUTE ONLY): 18 min  Past Medical History:  Past Medical History:  Diagnosis Date  . Anemia   . Cancer ([place])    bladder  . Chronic venous insufficiency   . Colitis   . Colon polyps   . Depression   . Diverticulosis   . DVT of deep femoral vein ([place])   . Gastritis   . GERD (gastroesophageal reflux disease)   . History of rectal polyps   . Hyperlipidemia   . Hypertension   . Iron deficiency   . Low back pain   . Lymphedema of both lower extremities   . OA (osteoarthritis)   . 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 Schizoaffective disorder, bipolar type ([place])   . Sleep apnea   . Varicose vein    Past Surgical History:  Past Surgical History:  Procedure Laterality Date  . BLADDER SURGERY    . TOTAL KNEE ARTHROPLASTY Bilateral   . VARICOSE VEIN SURGERY     HPI:  Shaun Yoder is a 74 y.o. male with medical history significant of scoliosis, debility, Pressure ulcers, PAD, aortic stenosis, bladder Ca, Colitis, DVT, OSA, schozophrenia, peripheral Neuropathy, rheumatoid arthritis, chronic respiratory failure lower extremity lymphedema, memory loss  Oxygen needs have increased from five to now ten liters over the last several days.  Pt is on hospice prior to admission.   Assessment / Plan / Recommendation Clinical Impression  Pt sitting upright in bed - has scoliosis and appears uncomfortable - wife present.  Question if pt is tripoding due to respiratory issues, scolisosis and/or discomfort.  Pt reports pain "all over" and wife states he just has his medications.  .Pt did not follow directions for swallow evaluation but agreed to consume water due to his xerostomia/open mouth with tongue protrusion (which wife states is not  unusual).    Consumption of thin water and juice observed - with pt demonstrating cough post-swallow with 2/4 boluses. Small single boluses tolerated without overt indication of aspiration but this required SLP to limit his intake by pinching the straw.  Pt's meal arrived during session and he declined to consume anything and requested for it to be "taken away".     Pt's wife was shown his prior MBS in May 2021 with review of swallow precautions at this time.  Also reviewed a "normal mbs" with wife for comparison.  Pt has silent aspiration on mbs *trace* and silent penetration - and swallow is likely worse currently due to respiratory status.    Pt appeared to become disoriented stating he needed his medications and he was going to "go to bed".  Wife and SLP reoriented him to current time of day and that he just had his medications.    Per RN, pt not eating much of anything.  Pt is at elevated risk of aspiration with increased asp pna risk and he was being followed by hospice PTA.    Depending on goals and pt/wife wishes, repeat MBS would allow determination of aspiration and least restrictive diet/compensations - however uncertain it pt would follow precautions or desire diet modification given his reticence to todays' evaluation.  Careful hand feeding of textures he enjoys with precautions may be reasonable plan.  If MBS is desired - MD please order.  SLP will sign off as wife has been educated to prior test results, and aspiration mitigation strategies in writing and with demonstration.  SLP advised wife and RN, prefer medication with puree (start and follow with water) as tablet lodged in pharynx on MBS. SLP Visit Diagnosis: Dysphagia, oropharyngeal phase (R13.12)    Aspiration Risk  Risk for inadequate nutrition/hydration;Moderate aspiration risk    Diet Recommendation Regular;Thin liquid (dys3 for energy conservation and thin -known asp risk -  water with meals)   Liquid Administration  via: Cup;Straw Medication Administration: Whole meds with puree (whole with puree preferred but pt and wife report pt prefers to take medicine with water) Compensations: Small sips/bites;Slow rate;Other (Comment) (stop intake if pt coughing or short of breath) Postural Changes: Remain upright for at least 30 minutes after po intake;Other (Comment) (as upright as able)    Other  Recommendations Oral Care Recommendations: Other (Comment) (clean dentures and brush oral cavity with toothbrush at least nightly)   Follow up Recommendations   Order MBS if pt and wife desire participation     Frequency and Duration   n/a         Prognosis   n/a     Swallow Study   General Date of Onset: 06/17/20 HPI: Shaun Yoder is a 74 y.o. male with medical history significant of scoliosis, debility, Pressure ulcers, PAD, aortic stenosis, bladder Ca, Colitis, DVT, OSA, schozophrenia, peripheral Neuropathy, rheumatoid arthritis, chronic respiratory failure lower extremity lymphedema, memory loss  Oxygen needs have increased from five to now ten liters over the last several days.  Pt is on hospice prior to admission. Type of Study: Bedside Swallow Evaluation Previous Swallow Assessment: 11/2019 silent penetration, and silent trace aspiration Diet Prior to this Study: Regular;Thin liquids Temperature Spikes Noted: Yes Respiratory Status: Other (comment) (HFNC 10) Behavior/Cognition: Alert;Confused Oral Cavity Assessment: Dry Oral Care Completed by SLP: No Oral Cavity - Dentition: Dentures, top Self-Feeding Abilities: Needs assist Patient Positioning: Upright in bed Baseline Vocal Quality: Low vocal intensity;Breathy Volitional Cough: Weak (weak reflexive cough noted) Volitional Swallow: Unable to elicit (did not follow directions)    Oral/Motor/Sensory Function Overall Oral Motor/Sensory Function: Other (comment) (pt did not follow directions but was able to seal lips on straw)   Ice Chips Ice  chips: Not tested   Thin Liquid Thin Liquid: Impaired Presentation: Straw Pharyngeal  Phase Impairments: Cough - Immediate Other Comments: cough x2 of 4 boluses provided, small single boluses tolerated better than sequential, pt has h/o silent trace aspiration and frank penetration of thin on mbs 11/2019    Nectar Thick Nectar Thick Liquid: Not tested   Honey Thick Honey Thick Liquid: Not tested   Puree Puree: Not tested   Solid     Solid: Not tested      Shaun Yoder 06/17/2020,1:45 PM   Kathleen Lime, MS [facility] SLP Acute Rehab Services Office [phone] Pager [phone]

## 2020-06-18 ENCOUNTER — Inpatient Hospital Stay (HOSPITAL_COMMUNITY): Payer: Medicare Other

## 2020-06-18 DIAGNOSIS — I350 Nonrheumatic aortic (valve) stenosis: Secondary | ICD-10-CM | POA: Diagnosis not present

## 2020-06-18 DIAGNOSIS — I5033 Acute on chronic diastolic (congestive) heart failure: Secondary | ICD-10-CM | POA: Diagnosis not present

## 2020-06-18 DIAGNOSIS — E785 Hyperlipidemia, unspecified: Secondary | ICD-10-CM | POA: Diagnosis not present

## 2020-06-18 DIAGNOSIS — J9621 Acute and chronic respiratory failure with hypoxia: Secondary | ICD-10-CM | POA: Diagnosis not present

## 2020-06-18 LAB — CBC
HCT: 37.6 % — ABNORMAL LOW (ref 39.0–52.0)
Hemoglobin: 11.7 g/dL — ABNORMAL LOW (ref 13.0–17.0)
MCH: 28.7 pg (ref 26.0–34.0)
MCHC: 31.1 g/dL (ref 30.0–36.0)
MCV: 92.4 fL (ref 80.0–100.0)
Platelets: 126 10*3/uL — ABNORMAL LOW (ref 150–400)
RBC: 4.07 MIL/uL — ABNORMAL LOW (ref 4.22–5.81)
RDW: 16 % — ABNORMAL HIGH (ref 11.5–15.5)
WBC: 6.3 10*3/uL (ref 4.0–10.5)
nRBC: 0 % (ref 0.0–0.2)

## 2020-06-18 LAB — COMPREHENSIVE METABOLIC PANEL
ALT: 18 U/L (ref 0–44)
AST: 35 U/L (ref 15–41)
Albumin: 2.5 g/dL — ABNORMAL LOW (ref 3.5–5.0)
Alkaline Phosphatase: 62 U/L (ref 38–126)
Anion gap: 10 (ref 5–15)
BUN: 32 mg/dL — ABNORMAL HIGH (ref 8–23)
CO2: 36 mmol/L — ABNORMAL HIGH (ref 22–32)
Calcium: 8.3 mg/dL — ABNORMAL LOW (ref 8.9–10.3)
Chloride: 91 mmol/L — ABNORMAL LOW (ref 98–111)
Creatinine, Ser: 0.59 mg/dL — ABNORMAL LOW (ref 0.61–1.24)
GFR, Estimated: >60 mL/min (ref >60)
Glucose, Bld: 90 mg/dL (ref 70–99)
Potassium: 4.2 mmol/L (ref 3.5–5.1)
Sodium: 137 mmol/L (ref 135–145)
Total Bilirubin: 0.6 mg/dL (ref 0.3–1.2)
Total Protein: 6.7 g/dL (ref 6.5–8.1)

## 2020-06-18 NOTE — Progress Notes (Signed)
Pt seen, resting comfortably, HR 80, rr24, spo2 99% on 9L [place]/high flow cannula.  Bipap not indicated at this time.  RT will assist as needed.

## 2020-06-18 NOTE — Progress Notes (Signed)
Modified Barium Swallow Progress Note  Patient Details  Name: Shaun Yoder MRN: [id number] Date of Birth: Jun 19, 1946  Today's Date: 06/18/2020  Modified Barium Swallow completed.  Full report located under Chart Review in the Imaging Section.  Brief recommendations include the following:  Clinical Impression  Mr. Soward had reluctant participation in modified barium swallow study. Study showed a mild oral and mild-moderate pharyngeal dysphagia. Oral phase is characterized by decreased mastication largely related to poor dentition. He was edentulous for this study, but reports he has upper dentures and is getting lower dentures next week. He also had decreased bolus control/labial seal for thin liquids via tsp, which resolved with use of straw. Pharyngeal phase is characterized by poor timing of swallow initiation/epiglottic inversion, which intermittently results in very trace (silent) aspiration of thin liquids when pt takes consecutive sips. He is unable to drink from cup given severity of kyphosis, but with single straw sips, pt had no aspiration and only transient penetration. With nectar thick liquids via straw, pt was noted with frank aspiration in moderate amounts and reported, "wow, you've really gotta suck hard on that!" He was sensate to larger amount of aspiration with the nectar thick liquid and had a strong cough, but was ineffectively in clearing penetrant from airway. He had no difficulty with honey thick liquids, but reported, "ew, that's awful. I don't want to do that one again." Pt had no pharyngeal difficulty with purees or soft solids. Esophageal phase could not be visualized given pt positioning.  Recommend: thin liquids via straw with cues for small, single sips, diligent oral care TID with toothbrush/toothpaste to gums, tongue, dysphagia 2 solids (finely chopped), meds whole or crushed in pureed, as tolerated   Swallow Evaluation Recommendations   SLP Diet  Recommendations: Dysphagia 2 (Fine chop) solids;Thin liquid   Liquid Administration via: Straw;Spoon (single sips only--may need supervision/pinching of straw)   Medication Administration: Whole meds with puree   Supervision: Staff to assist with self feeding;Full supervision/cueing for compensatory strategies   Postural Changes: Seated upright at 90 degrees   Oral Care Recommendations: Oral care BID       [name] [name] P. Aniel Hubble, M.S., [place] Pathologist Acute Rehabilitation Services Pager: [place] 06/18/2020,3:22 PM

## 2020-06-18 NOTE — Progress Notes (Addendum)
PROGRESS NOTE    Shaun Yoder  KNL:[id number] DOB: 1946-01-14 DOA: 06/13/2020 PCP: Colon Branch, MD   Brief Narrative:  Shaun Yoder is a 74 y.o. male with medical history significant of scoliosis, debility, Pressure ulcers, PAD, aortic stenosis, bladder Ca, Colitis, DVT, OSA, schozophrenia, peripheral Neuropathy, rheumatoid arthritis, lower extremity lymphedema, and memory loss. Presented with patient normally resides at the nursing facility where he is wheelchair-bound.  He was visiting his wife for Thanksgiving. Family noted that he was having more shortness of breath and some wheezing he is usually on few liters of oxygen EMS was called he was satting 60% while using his oxygen tank. He was switched to EMS oxygen supply and his oxygenation improved to mid 80s on 2 L he was placed on 4 L going up to the 90s. Was provided albuterol and Atrovent nebulizer. Per wife pt has known hx of severe aortic stenosis but have refused care in the past. He has been more SOB for the past 2 wk requiring O2 at his Nursing home. He has been also more sleepy he typically falls asleep during conversations. Reports wheezing, no fever no chest pain.  Admitted for acute hypoxia in setting of presumed heart failure exacerbation.  After further evaluation patient noted to have severe advancing dysphagia with likely chronic aspiration per speech therapy, awaiting modified barium swallow to further evaluate.  His aspiration is likely the cause of his acute hypoxia and pneumonitis with likely parapneumonic effusion.  Continue diuresis, attempt to transition to safe diet in the next 24 hours pending speech evaluation.  Family well aware continues to fail barium swallow or speech evaluation and would likely need made hospice care due to advanced age and comorbidities would preclude him from safe PEG tube placement.   Assessment & Plan:   Active Problems:   Aortic stenosis, severe   Sleep apnea   GERD  (gastroesophageal reflux disease)   Schizoaffective disorder, bipolar type (HCC)   Rheumatoid arthritis (HCC)   Hyperlipidemia   Hypertension   Left leg DVT (HCC)   Acute on chronic respiratory failure with hypoxia and hypercapnia (HCC)   Acute on chronic diastolic CHF (congestive heart failure) (HCC)    Goals of Care Lengthy discussion today with wife over the phone about patient's prognosis given advanced age, chronic comorbid conditions, further discussion about quality of life, patient already DNR without any issues for aggressive treatment. We discussed his high likelihood of having poor p.o. intake or no p.o. intake if he fails swallow evaluation at which time we will need to discuss palliative care and hospice, if he cannot tolerate p.o. safely he will likely not progress with therapy to improve strength and swallowing function will likely continue to decline.  We discussed PEG tube is not appropriate given his age and wishes for no further aggressive treatment.  Acute respiratory failure with hypoxia and hypercapnia secondary to recurrent aspiration/pneumonitis, POA Concurrent volume overload/pleural effusion in the setting of HF exacerbation Continue antibiotic coverage, diuresis, ipratropium and oxygen Patient agreeable for BiPAP -otherwise no aggressive interventions Continue to wean oxygen, he does not truly have chronic respiratory failure as he was just placed on oxygen 2 weeks ago per family's respiratory distress and hypoxia Speech evaluation ongoing modified barium swallow pending, hopeful patient will be able to tolerate safe diet otherwise we will need to discuss palliative care and hospice given poor candidate for PEG tube placement Discontinue prednisone to 20 twice daily given rapid A. fib as below  Provoked  A. fib, currently in RVR, improving Likely provoked given above hypoxia and respiratory distress/pneumonitis Heart rate high 90s to low 100s this morning, continue  rate control as needed Continue diuresing and supportive care as above Discontinue steroids, albuterol transition to ipratropium only  Acute on chronic diastolic CHF  Continue Lasix 60 mg IV twice daily, follow creatinine closely (remains within normal limits) Urine output continues to improve Echocardiogram on 06/16/2020 shows EF of 30 to 35% with severely decreased function global hypokinesis and left ventricular dilation.  Right ventricle function moderately reduced with severely elevated pulmonary artery systolic pressure at 39.0.  Sleep apnea - Reportedly noncompliant with CPAP  Schizoaffective disorder, bipolar type (HCC)  Continue Latuda and Depakote Depakote level within normal limits at admission  Rheumatoid arthritis ([place])  Currently holding prednisone given rapid A. fib and RVR as above -resume once heart rate is well controlled  Left leg DVT (HCC) -continue Eliquis  Hypertension -monitor blood pressure while being diuresed -borderline low this morning  Hyperlipidemia -not on statins at baseline; questionable tolerance issues  GERD (gastroesophageal reflux disease) chronic continue home meds  Lower extremity wounds -wound care consult appreciated patient has been receiving wound care while at the nursing home  Elevated troponin; type 2 supply/demand mismatch given hypoxia  -in the setting of above hypoxia; no reports of chest pain continue to monitor obtain echogram continue cycle Patient not interested in aggressive intervention  Hypoalbuminemia  Nutritional consult following  DVT prophylaxis:   Eliquis Code Status: DNR Family Communication: wife by phone  Status is: Inpatient  Dispo: The patient is from: SNF              Anticipated d/c is to: Same              Anticipated d/c date is: 72 to 96 hours pending clinical course              Patient currently not medically stable for discharge  Consultants:   None  Procedures:    None  Antimicrobials:  None  Subjective: Patient noted to overtly aspirate multiple foods, liquids and medications overnight.  Currently n.p.o.  Feels his respiratory status somewhat improved since yesterday but not yet back to baseline.  He otherwise denies chest pain, headache, fevers, chills.    Objective: Vitals:   06/18/20 0652 06/18/20 0653 06/18/20 0658 06/18/20 0700  BP:   92/65 94/76  Pulse:    82  Resp:    (!) 32  Temp:      TempSrc:      SpO2: (S) (!) 73% (!) 80%  94%  Weight:      Height:        Intake/Output Summary (Last 24 hours) at 06/18/2020 0738 Last data filed at 06/17/2020 2038 Gross per 24 hour  Intake 360 ml  Output 1510 ml  Net -1150 ml   Filed Weights   06/19/2020 1422 06/16/20 0453  Weight: 76.5 kg 78 kg    Examination:  General:  Pleasantly resting in bed, No acute distress.  Somewhat somnolent but oriented to person HEENT:  Normocephalic atraumatic.  Sclerae nonicteric, noninjected.  Extraocular movements intact bilaterally. Neck:  Without mass or deformity.  Trachea is midline. Lungs: Rhonchorous breath sounds bilaterally, bibasilar rales, moderate expiratory wheeze bilateral Heart: Irregularly irregular rate controlled.  Without murmurs, rubs, or gallops. Abdomen:  Soft, nontender, nondistended.  Without guarding or rebound. Extremities: Without cyanosis, clubbing; 1-2+ pitting edema bilateral lower extremities Vascular:  Dorsalis pedis and posterior tibial  pulses palpable bilaterally. Skin:  Warm and dry, no erythema, multiple excoriations skin lesions noted on bilateral lower extremities most notably at the toes again bilaterally, see wound care documentation for details  Data Reviewed: I have personally reviewed following labs and imaging studies  CBC: Recent Labs  Lab 06/01/2020 1439 06/16/20 0256 06/17/20 0218 06/18/20 0314  WBC 5.8 3.4* 4.9 6.3  NEUTROABS 4.4 3.0  --   --   HGB 12.0* 11.5* 11.6* 11.7*  HCT 39.2 38.4* 37.8*  37.6*  MCV 96.8 95.0 93.3 92.4  PLT 140* 117* 120* 425*   Basic Metabolic Panel: Recent Labs  Lab 06/03/2020 1439 06/03/2020 1909 06/16/20 0256 06/17/20 0218 06/18/20 0314  NA 137  --  135 134* 137  K 4.9  --  4.5 4.1 4.2  CL 96*  --  96* 94* 91*  CO2 33*  --  31 30 36*  GLUCOSE 89  --  110* 120* 90  BUN 24*  --  24* 29* 32*  CREATININE 0.62  --  0.55* 0.50* 0.59*  CALCIUM 8.6*  --  8.4* 8.4* 8.3*  MG 1.8  --  1.7  --   --   PHOS  --  3.6 4.1  --   --    GFR: Estimated Creatinine Clearance: 71.7 mL/min (A) (by C-G formula based on SCr of 0.59 mg/dL (L)). Liver Function Tests: Recent Labs  Lab 06/02/2020 1439 06/16/20 0256 06/18/20 0314  AST 28 26 35  ALT 19 16 18   ALKPHOS 87 75 62  BILITOT 0.9 1.2 0.6  PROT 7.3 6.5 6.7  ALBUMIN 3.0* 2.7* 2.5*   No results for input(s): LIPASE, AMYLASE in the last 168 hours. No results for input(s): AMMONIA in the last 168 hours. Coagulation Profile: Recent Labs  Lab 05/30/2020 1444  INR 1.1   Cardiac Enzymes: Recent Labs  Lab 06/07/2020 1444  CKTOTAL 54   BNP (last 3 results) No results for input(s): PROBNP in the last 8760 hours. HbA1C: No results for input(s): HGBA1C in the last 72 hours. CBG: No results for input(s): GLUCAP in the last 168 hours. Lipid Profile: No results for input(s): CHOL, HDL, LDLCALC, TRIG, CHOLHDL, LDLDIRECT in the last 72 hours. Thyroid Function Tests: Recent Labs    06/18/2020 1444  TSH 0.920   Anemia Panel: No results for input(s): VITAMINB12, FOLATE, FERRITIN, TIBC, IRON, RETICCTPCT in the last 72 hours. Sepsis Labs: No results for input(s): PROCALCITON, LATICACIDVEN in the last 168 hours.  Recent Results (from the past 240 hour(s))  Resp Panel by RT-PCR (Flu A&B, Covid) Nasopharyngeal Swab     Status: None   Collection Time: 06/09/2020  2:44 PM   Specimen: Nasopharyngeal Swab; Nasopharyngeal(NP) swabs in vial transport medium  Result Value Ref Range Status   SARS Coronavirus 2 by RT PCR  NEGATIVE NEGATIVE Final    Comment: (NOTE) SARS-CoV-2 target nucleic acids are NOT DETECTED.  The SARS-CoV-2 RNA is generally detectable in upper respiratory specimens during the acute phase of infection. The lowest concentration of SARS-CoV-2 viral copies this assay can detect is 138 copies/mL. A negative result does not preclude SARS-Cov-2 infection and should not be used as the sole basis for treatment or other patient management decisions. A negative result may occur with  improper specimen collection/handling, submission of specimen other than nasopharyngeal swab, presence of viral mutation(s) within the areas targeted by this assay, and inadequate number of viral copies(<138 copies/mL). A negative result must be combined with clinical observations, patient history, and  epidemiological information. The expected result is Negative.  Fact Sheet for Patients:  EntrepreneurPulse.com.au  Fact Sheet for Healthcare Providers:  IncredibleEmployment.be  This test is no t yet approved or cleared by the [place] FDA and  has been authorized for detection and/or diagnosis of SARS-CoV-2 by FDA under an Emergency Use Authorization (EUA). This EUA will remain  in effect (meaning this test can be used) for the duration of the COVID-19 declaration under Section 564(b)(1) of the Act, 21 U.S.C.section 360bbb-3(b)(1), unless the authorization is terminated  or revoked sooner.       Influenza A by PCR NEGATIVE NEGATIVE Final   Influenza B by PCR NEGATIVE NEGATIVE Final    Comment: (NOTE) The Xpert Xpress SARS-CoV-2/FLU/RSV plus assay is intended as an aid in the diagnosis of influenza from Nasopharyngeal swab specimens and should not be used as a sole basis for treatment. Nasal washings and aspirates are unacceptable for Xpert Xpress SARS-CoV-2/FLU/RSV testing.  Fact Sheet for Patients: EntrepreneurPulse.com.au  Fact Sheet for  Healthcare Providers: IncredibleEmployment.be  This test is not yet approved or cleared by the [place] FDA and has been authorized for detection and/or diagnosis of SARS-CoV-2 by FDA under an Emergency Use Authorization (EUA). This EUA will remain in effect (meaning this test can be used) for the duration of the COVID-19 declaration under Section 564(b)(1) of the Act, 21 U.S.C. section 360bbb-3(b)(1), unless the authorization is terminated or revoked.  Performed at [facility], [place] [address], [place], [place] [address]   MRSA PCR Screening     Status: Abnormal   Collection Time: 05/30/2020 11:21 PM   Specimen: Nasal Mucosa; Nasopharyngeal  Result Value Ref Range Status   MRSA by PCR POSITIVE (A) NEGATIVE Final    Comment:        The GeneXpert MRSA Assay (FDA approved for NASAL specimens only), is one component of a comprehensive MRSA colonization surveillance program. It is not intended to diagnose MRSA infection nor to guide or monitor treatment for MRSA infections. CRITICAL RESULT CALLED TO, READ BACK BY AND VERIFIED WITH: RN O SCHIES AT 0157 06/16/20 [place] A Performed at [facility], [place] [address] [place], [place] [address]          Radiology Studies: [facility] Chest Port 1 View  Result Date: 06/17/2020 CLINICAL DATA:  Hypoxia. EXAM: PORTABLE CHEST 1 VIEW COMPARISON:  June 15, 2020 FINDINGS: Enlarged cardiac silhouette. Calcific atherosclerotic disease of the aorta. Low lung volumes with bilateral interstitial opacities with central predominance. Stable abnormal appearance of the bilateral proximal humeri. IMPRESSION: 1. Enlarged cardiac silhouette. 2. Low lung volumes with bilateral interstitial opacities with central predominance, which likely represent interstitial pulmonary edema. Electronically Signed   By: Fidela Salisbury M.D.   On: 06/17/2020 14:57   ECHOCARDIOGRAM COMPLETE  Result Date:  06/16/2020    ECHOCARDIOGRAM REPORT   Patient Name:   Shaun Yoder Date of Exam: 06/16/2020 Medical Rec #:  [id number]          Height:       61.0 in Accession #:    [id number]         Weight:       172.0 lb Date of Birth:  09/29/1945         BSA:          1.771 m Patient Age:    63 years           BP:  100/74 mmHg Patient Gender: M                  HR:           73 bpm. Exam Location:  Inpatient Procedure: 2D Echo, Cardiac Doppler, Color Doppler and Intracardiac            Opacification Agent Indications:    I50.40* Unspecified combined systolic (congestive) and diastolic                 (congestive) heart failure  History:        Patient has prior history of Echocardiogram examinations, most                 recent 12/26/2017. CHF, Aortic Valve Disease,                 Signs/Symptoms:Altered Mental Status; Risk Factors:Sleep Apnea,                 Hypertension and Dyslipidemia. Severe aortic stenosis.  Sonographer:    Roseanna Rainbow RDCS Referring Phys: [place]  Sonographer Comments: Technically difficult study due to poor echo windows. IMPRESSIONS  1. Left ventricular ejection fraction, by estimation, is 30 to 35%. The left ventricle has moderate to severely decreased function. The left ventricle demonstrates global hypokinesis. The left ventricular internal cavity size was mildly dilated. Left ventricular diastolic function could not be evaluated.  2. Right ventricular systolic function is moderately reduced. The right ventricular size is mildly enlarged. There is severely elevated pulmonary artery systolic pressure. The estimated right ventricular systolic pressure is 03.5 mmHg.  3. Left atrial size was moderately dilated.  4. The mitral valve is degenerative. Trivial mitral valve regurgitation. No evidence of mitral stenosis. Moderate to severe mitral annular calcification.  5. Tricuspid valve regurgitation is mild to moderate.  6. The aortic valve has an indeterminant number of cusps.  There is severe calcifcation of the aortic valve. There is severe thickening of the aortic valve. Aortic valve regurgitation is mild. Severe aortic valve stenosis.  7. The inferior vena cava is dilated in size with <50% respiratory variability, suggesting right atrial pressure of 15 mmHg. Comparison(s): Prior images unable to be directly viewed, comparison made by report only. The left ventricular function is significantly worse. There is now severe pulmonary artery hypertension. FINDINGS  Left Ventricle: Left ventricular ejection fraction, by estimation, is 30 to 35%. The left ventricle has moderate to severely decreased function. The left ventricle demonstrates global hypokinesis. Definity contrast agent was given IV to delineate the left ventricular endocardial borders. The left ventricular internal cavity size was mildly dilated. There is no left ventricular hypertrophy. Left ventricular diastolic function could not be evaluated due to nondiagnostic images. Left ventricular diastolic function could not be evaluated. Right Ventricle: The right ventricular size is mildly enlarged. Right vetricular wall thickness was not well visualized. Right ventricular systolic function is moderately reduced. There is severely elevated pulmonary artery systolic pressure. The tricuspid regurgitant velocity is 3.76 m/s, and with an assumed right atrial pressure of 15 mmHg, the estimated right ventricular systolic pressure is 00.9 mmHg. Left Atrium: Left atrial size was moderately dilated. Right Atrium: Right atrial size was normal in size. Pericardium: There is no evidence of pericardial effusion. Mitral Valve: The mitral valve is degenerative in appearance. Moderate to severe mitral annular calcification. Trivial mitral valve regurgitation. No evidence of mitral valve stenosis. MV peak gradient, 15.5 mmHg. The mean mitral valve gradient is 5.0 mmHg. Tricuspid Valve:  The tricuspid valve is grossly normal. Tricuspid valve  regurgitation is mild to moderate. Aortic Valve: The aortic valve has an indeterminant number of cusps. There is severe calcifcation of the aortic valve. There is severe thickening of the aortic valve. Aortic valve regurgitation is mild. Severe aortic stenosis is present. Aortic valve mean gradient measures 67.5 mmHg. Aortic valve peak gradient measures 104.4 mmHg. Aortic valve area, by VTI measures 0.75 cm. Pulmonic Valve: The pulmonic valve was not well visualized. Pulmonic valve regurgitation is not visualized. Aorta: The aortic root and ascending aorta are structurally normal, with no evidence of dilitation. Venous: The inferior vena cava is dilated in size with less than 50% respiratory variability, suggesting right atrial pressure of 15 mmHg. IAS/Shunts: No atrial level shunt detected by color flow Doppler.  LEFT VENTRICLE PLAX 2D LVIDd:         5.60 cm      Diastology LVIDs:         4.50 cm      LV e' medial:    3.26 cm/s LV PW:         1.20 cm      LV E/e' medial:  49.4 LV IVS:        0.90 cm      LV e' lateral:   3.15 cm/s LVOT diam:     2.50 cm      LV E/e' lateral: 51.1 LV SV:         100 LV SV Index:   57 LVOT Area:     4.91 cm  LV Volumes (MOD) LV vol d, MOD A2C: 120.0 ml LV vol d, MOD A4C: 119.0 ml LV vol s, MOD A2C: 85.2 ml LV vol s, MOD A4C: 78.7 ml LV SV MOD A2C:     34.8 ml LV SV MOD A4C:     119.0 ml LV SV MOD BP:      35.3 ml RIGHT VENTRICLE TAPSE (M-mode): 2.2 cm LEFT ATRIUM           Index       RIGHT ATRIUM           Index LA diam:      4.30 cm 2.43 cm/m  RA Area:     13.70 cm LA Vol (A2C): 69.4 ml 39.18 ml/m RA Volume:   29.90 ml  16.88 ml/m LA Vol (A4C): 72.0 ml 40.65 ml/m  AORTIC VALVE AV Area (Vmax):    0.82 cm AV Area (Vmean):   0.75 cm AV Area (VTI):     0.75 cm AV Vmax:           511.00 cm/s AV Vmean:          392.000 cm/s AV VTI:            1.335 m AV Peak Grad:      104.4 mmHg AV Mean Grad:      67.5 mmHg LVOT Vmax:         85.80 cm/s LVOT Vmean:        60.000 cm/s LVOT VTI:           0.204 m LVOT/AV VTI ratio: 0.15  AORTA Ao Root diam: 3.30 cm Ao Asc diam:  3.80 cm MITRAL VALVE                TRICUSPID VALVE MV Area (PHT): 6.32 cm     TR Peak grad:   56.6 mmHg MV Peak grad:  15.5 mmHg    TR  Vmax:        376.00 cm/s MV Mean grad:  5.0 mmHg MV Vmax:       1.97 m/s     SHUNTS MV Vmean:      93.3 cm/s    Systemic VTI:  0.20 m MV Decel Time: 120 msec     Systemic Diam: 2.50 cm MV E velocity: 161.00 cm/s Dani Gobble Croitoru MD Electronically signed by Sanda Klein MD Signature Date/Time: 06/16/2020/12:57:56 PM    Final    Scheduled Meds: . (feeding supplement) PROSource Plus  30 mL Oral Daily  . apixaban  2.5 mg Oral BID  . aspirin EC  81 mg Oral Daily  . Chlorhexidine Gluconate Cloth  6 each Topical Daily  . divalproex  1,000 mg Oral QHS  . furosemide  60 mg Intravenous BID  . guaiFENesin  600 mg Oral BID  . lurasidone  80 mg Oral QHS  . mouth rinse  15 mL Mouth Rinse BID  . mirabegron ER  25 mg Oral Daily  . mupirocin ointment  1 application Nasal BID  . nutrition supplement (JUVEN)  1 packet Oral BID BM  . oxybutynin  10 mg Oral QHS  . pantoprazole  40 mg Oral Daily  . sodium chloride flush  3 mL Intravenous Q12H   Continuous Infusions: . sodium chloride       LOS: 3 days   Time spent: 23min  Shaun Sar C Desera Graffeo, DO [facility]  If 7PM-7AM, please contact night-coverage [email or web address]  06/18/2020, 7:38 AM

## 2020-06-18 NOTE — Progress Notes (Addendum)
Pt's BP has been soft. Pt has also been showing signs of aspiration, cough, sob,and coarse crackles. O2 requirements have went up during the night. Pt is on 29 L salter [facility]. Dr. Avon Gully made aware. Dr. Avon Gully said to still give lasix but keep pt NPO in the meantime. This nurse will continue to monitor.

## 2020-06-19 DIAGNOSIS — I509 Heart failure, unspecified: Secondary | ICD-10-CM | POA: Diagnosis not present

## 2020-06-19 DIAGNOSIS — I350 Nonrheumatic aortic (valve) stenosis: Secondary | ICD-10-CM | POA: Diagnosis not present

## 2020-06-19 DIAGNOSIS — E785 Hyperlipidemia, unspecified: Secondary | ICD-10-CM | POA: Diagnosis not present

## 2020-06-19 DIAGNOSIS — I5033 Acute on chronic diastolic (congestive) heart failure: Secondary | ICD-10-CM | POA: Diagnosis not present

## 2020-06-19 DIAGNOSIS — Z515 Encounter for palliative care: Secondary | ICD-10-CM

## 2020-06-19 DIAGNOSIS — J9621 Acute and chronic respiratory failure with hypoxia: Secondary | ICD-10-CM | POA: Diagnosis not present

## 2020-06-19 LAB — COMPREHENSIVE METABOLIC PANEL
ALT: 19 U/L (ref 0–44)
AST: 41 U/L (ref 15–41)
Albumin: 2.8 g/dL — ABNORMAL LOW (ref 3.5–5.0)
Alkaline Phosphatase: 63 U/L (ref 38–126)
Anion gap: 12 (ref 5–15)
BUN: 30 mg/dL — ABNORMAL HIGH (ref 8–23)
CO2: 38 mmol/L — ABNORMAL HIGH (ref 22–32)
Calcium: 8.4 mg/dL — ABNORMAL LOW (ref 8.9–10.3)
Chloride: 86 mmol/L — ABNORMAL LOW (ref 98–111)
Creatinine, Ser: 0.62 mg/dL (ref 0.61–1.24)
GFR, Estimated: >60 mL/min (ref >60)
Glucose, Bld: 97 mg/dL (ref 70–99)
Potassium: 4.3 mmol/L (ref 3.5–5.1)
Sodium: 136 mmol/L (ref 135–145)
Total Bilirubin: 0.9 mg/dL (ref 0.3–1.2)
Total Protein: 7 g/dL (ref 6.5–8.1)

## 2020-06-19 LAB — CBC
HCT: 42.7 % (ref 39.0–52.0)
Hemoglobin: 12.9 g/dL — ABNORMAL LOW (ref 13.0–17.0)
MCH: 28.9 pg (ref 26.0–34.0)
MCHC: 30.2 g/dL (ref 30.0–36.0)
MCV: 95.5 fL (ref 80.0–100.0)
Platelets: 124 10*3/uL — ABNORMAL LOW (ref 150–400)
RBC: 4.47 MIL/uL (ref 4.22–5.81)
RDW: 15.7 % — ABNORMAL HIGH (ref 11.5–15.5)
WBC: 7.2 10*3/uL (ref 4.0–10.5)
nRBC: 0 % (ref 0.0–0.2)

## 2020-06-19 MED ORDER — HALOPERIDOL LACTATE 5 MG/ML IJ SOLN: 2.0000 mg | Freq: Four times a day (QID) | INTRAMUSCULAR | Status: DC | PRN

## 2020-06-19 MED ORDER — MORPHINE 100MG IN NS 100ML (1MG/ML) PREMIX INFUSION
1.0000 mg/h | INTRAVENOUS | Status: DC
  Filled 2020-06-19: qty 100

## 2020-06-19 MED ORDER — LORAZEPAM 2 MG/ML IJ SOLN: 2.0000 mg | INTRAMUSCULAR | Status: DC | PRN

## 2020-06-19 MED ORDER — OXYMETAZOLINE HCL 0.05 % NA SOLN
2.0000 | Freq: Once | NASAL | Status: AC
  Administered 2020-06-19: 2 via NASAL
  Filled 2020-06-19: qty 15

## 2020-06-19 MED ORDER — SODIUM CHLORIDE 0.9 % IV SOLN
0.1000 mg/h | INTRAVENOUS | Status: DC
  Administered 2020-06-19: 0.1 mg/h via INTRAVENOUS
  Filled 2020-06-19: qty 2.5

## 2020-06-19 MED ORDER — MORPHINE BOLUS VIA INFUSION
2.0000 mg | INTRAVENOUS | Status: DC | PRN
  Filled 2020-06-19: qty 2

## 2020-06-19 MED ORDER — HYDROMORPHONE BOLUS VIA INFUSION
0.5000 mg | INTRAVENOUS | Status: DC | PRN
  Filled 2020-06-19: qty 1

## 2020-06-19 MED ORDER — OXYMETAZOLINE HCL 0.05 % NA SOLN
1.0000 | Freq: Two times a day (BID) | NASAL | Status: DC | PRN
  Filled 2020-06-19: qty 15

## 2020-06-19 MED ORDER — GLYCOPYRROLATE 0.2 MG/ML IJ SOLN
0.3000 mg | INTRAMUSCULAR | Status: DC | PRN
  Administered 2020-06-20 (×2): 0.3 mg via INTRAVENOUS
  Filled 2020-06-19 (×2): qty 2

## 2020-06-19 MED ORDER — PREDNISONE 20 MG PO TABS
20.0000 mg | ORAL_TABLET | Freq: Two times a day (BID) | ORAL | Status: DC
  Filled 2020-06-19: qty 1

## 2020-06-19 NOTE — Consult Note (Signed)
Palliative Care Consult Note  Mr. Shaun Yoder is a 74 yo man with advanced heart failure, symptomatic severe aortic stenosis, schizoaffective disorder with dementia, RA -steroid and biologic dependent with joint deformities and immobility who now has persistent aspiration and is admitted with aspiration PNA, hypotension and respiratory failure.  Patient has been a resident at a [facility] in [place], [place]. His family lives in [place]. He is fully service connected to the [place]. His wife shares with me how difficult the last 16 years have been caring for him in his declining and debilitated state of physical and mental health.  The patient is currently in ICU, he appears uncomfortable and distressed. His cough and vocal quality are wet and he is unable to manage any po and insists on being allowed ice water and is worried I am going to take it away from him. He is agitated and pulling at the sheets.He is requiring 9L with humidified nasal cannula and still has increased WOB and sats in low 90's.   I spoke with his HCPOA wife and NOK decision maker who tells me that he has been declining rapidly for a few months and when they brought him home for thanksgiving he was having issues with choking on food, saying he could not get air and was distressed so they called EMS. She is clear that his goals are comfort and QOL and that she would not want his life prolonged in his current state and neither would he if he were able to make that decision.   Recommendations: 1. Will transition to comfort care. 2. Initiate morphine infusion with bolus dosing for comfort 3. Comfort meds for agitation and sedation. 4. He is unable to take anything PO with out coughing and aspirating 5. Transfer off ICU to Med Surg  6. He is on 9L will titrate down once infusion is started 7. We discussed a hospice facility if he is able to transport safely.  [place] Hacker, DO Palliative Medicine [phone]  Time: 50  minutes Greater than 50%  of this time was spent counseling and coordinating care related to the above assessment and plan.

## 2020-06-19 NOTE — Progress Notes (Signed)
PROGRESS NOTE    Shaun Yoder  FXT:[id number] DOB: 03-23-1946 DOA: 05/22/2020 PCP: Colon Branch, MD   Brief Narrative:  Shaun Yoder is a 74 y.o. male with medical history significant of scoliosis, debility, Pressure ulcers, PAD, aortic stenosis, bladder Ca, Colitis, DVT, OSA, schozophrenia, peripheral Neuropathy, rheumatoid arthritis, lower extremity lymphedema, and memory loss. Presented with patient normally resides at the nursing facility where he is wheelchair-bound.  He was visiting his wife for Thanksgiving. Family noted that he was having more shortness of breath and some wheezing he is usually on few liters of oxygen EMS was called he was satting 60% while using his oxygen tank. He was switched to EMS oxygen supply and his oxygenation improved to mid 80s on 2 L he was placed on 4 L going up to the 90s. Was provided albuterol and Atrovent nebulizer. Per wife pt has known hx of severe aortic stenosis but have refused care in the past. He has been more SOB for the past 2 wk requiring O2 at his Nursing home. He has been also more sleepy he typically falls asleep during conversations. Reports wheezing, no fever no chest pain.  Admitted for acute hypoxia in setting of presumed heart failure exacerbation.  Patient continues to slowly worsen over the past few days, lengthy discussion with wife about goals of care.  Given his advancing respiratory distress, poor p.o. intake in the setting of remarkable dysphagia worsening mental status family has agreed to advanced to hospice/palliative care and comfort measures.  Family attempting to have their religious counselor visit for last rights today, they will discuss with other family about possible visitation.  At this time given patient's respiratory status requiring high flow oxygen which cannot be sustained at home patient will likely ultimately have an hospital demise.  At this time will initiate comfort feedings, with lengthy discussion with  family that this may worsen his respiratory status.  Otherwise discontinue all other p.o. medications IV medications and lab work.   Assessment & Plan:   Active Problems:   Aortic stenosis, severe   Sleep apnea   GERD (gastroesophageal reflux disease)   Schizoaffective disorder, bipolar type (HCC)   Rheumatoid arthritis ([place])   Hyperlipidemia   Hypertension   Left leg DVT (HCC)   Acute on chronic respiratory failure with hypoxia and hypercapnia (HCC)   Acute on chronic diastolic CHF (congestive heart failure) (HCC)  Goals of Care Lengthy discussion daily with wife -agreeable to advance to comfort measures.  Palliative care will help assist with possible disposition although less likely given patient's current oxygen needs.  Family attempted to set up family meeting and discussed with their church father for last rights.  Acute respiratory failure with hypoxia and hypercapnia secondary to recurrent aspiration/pneumonitis, POA Concurrent volume overload/pleural effusion in the setting of HF exacerbation Continue supportive care and supplemental oxygen for comfort Discontinue all antibiotics, IV medications Could reasonably continue nebulizer treatments for comfort if in respiratory distress  Provoked A. fib, currently in RVR, improving Discontinue telemetry/labs/vitals  Acute on chronic diastolic CHF  Patient actually appears euvolemic today, discontinue diuresis  Sleep apnea - Reportedly noncompliant with CPAP prior to admission  Schizoaffective disorder, bipolar type ([place])  We will discontinue Latuda and Depakote for now, if patient has worsening mental status could conceivably restart these for comfort measures while family visits if it is safe to take p.o.  Rheumatoid arthritis ([place])  Left leg DVT (HCC)  Hypertension Hyperlipidemia GERD Lower extremity wounds  DVT prophylaxis:  None, currently on comfort measures Code Status: DNR Family Communication: Wife  updated at bedside  Status is: Inpatient  Dispo: The patient is from: SNF              Anticipated d/c is to: Suspect in hospital demise now on comfort measures              Anticipated d/c date is: See above  Consultants:   None  Procedures:   None  Antimicrobials:  None  Subjective: Patient markedly altered this morning, quite somnolent, lengthy discussion with wife as above, will transition to palliative care.  Continue supportive care, at this point review of systems markedly limited given patient's mental status.  Objective: Vitals:   06/19/20 0400 06/19/20 0410 06/19/20 0412 06/19/20 0500  BP:  131/69    Pulse: 90  86 88  Resp: (!) 26  (!) 27 (!) 35  Temp:      TempSrc:      SpO2: 98%  97%   Weight:    83.7 kg  Height:        Intake/Output Summary (Last 24 hours) at 06/19/2020 0721 Last data filed at 06/19/2020 0000 Gross per 24 hour  Intake 110 ml  Output 2500 ml  Net -2390 ml   Filed Weights   06/14/2020 1422 06/16/20 0453 06/19/20 0500  Weight: 76.5 kg 78 kg 83.7 kg    Examination:  General:  Pleasantly resting in bed, No acute distress.  Disoriented and anxious. HEENT:  Normocephalic atraumatic.  Sclerae nonicteric, noninjected.  Extraocular movements intact bilaterally. Neck:  Without mass or deformity.  Trachea is midline. Lungs: Rhonchorous breath sounds bilaterally, bibasilar rales, moderate expiratory wheeze bilateral Heart: Irregularly irregular rate controlled.  Without murmurs, rubs, or gallops. Abdomen:  Soft, nontender, nondistended.  Without guarding or rebound. Extremities: Without cyanosis, clubbing; 1-2+ pitting edema bilateral lower extremities Vascular:  Dorsalis pedis and posterior tibial pulses palpable bilaterally. Skin:  Warm and dry, no erythema, multiple excoriations skin lesions noted on bilateral lower extremities most notably at the toes again bilaterally, see wound care documentation for details  Data Reviewed: I have  personally reviewed following labs and imaging studies  CBC: Recent Labs  Lab 05/29/2020 1439 06/16/20 0256 06/17/20 0218 06/18/20 0314 06/19/20 0314  WBC 5.8 3.4* 4.9 6.3 7.2  NEUTROABS 4.4 3.0  --   --   --   HGB 12.0* 11.5* 11.6* 11.7* 12.9*  HCT 39.2 38.4* 37.8* 37.6* 42.7  MCV 96.8 95.0 93.3 92.4 95.5  PLT 140* 117* 120* 126* 160*   Basic Metabolic Panel: Recent Labs  Lab 05/23/2020 1439 06/03/2020 1909 06/16/20 0256 06/17/20 0218 06/18/20 0314 06/19/20 0314  NA 137  --  135 134* 137 136  K 4.9  --  4.5 4.1 4.2 4.3  CL 96*  --  96* 94* 91* 86*  CO2 33*  --  31 30 36* 38*  GLUCOSE 89  --  110* 120* 90 97  BUN 24*  --  24* 29* 32* 30*  CREATININE 0.62  --  0.55* 0.50* 0.59* 0.62  CALCIUM 8.6*  --  8.4* 8.4* 8.3* 8.4*  MG 1.8  --  1.7  --   --   --   PHOS  --  3.6 4.1  --   --   --    GFR: Estimated Creatinine Clearance: 74.4 mL/min (by C-G formula based on SCr of 0.62 mg/dL). Liver Function Tests: Recent Labs  Lab 05/30/2020 1439 06/16/20 0256 06/18/20 7371  06/19/20 0314  AST 28 26 35 41  ALT 19 16 18 19   ALKPHOS 87 75 62 63  BILITOT 0.9 1.2 0.6 0.9  PROT 7.3 6.5 6.7 7.0  ALBUMIN 3.0* 2.7* 2.5* 2.8*   No results for input(s): LIPASE, AMYLASE in the last 168 hours. No results for input(s): AMMONIA in the last 168 hours. Coagulation Profile: Recent Labs  Lab 05/29/2020 1444  INR 1.1   Cardiac Enzymes: Recent Labs  Lab 06/09/2020 1444  CKTOTAL 54   BNP (last 3 results) No results for input(s): PROBNP in the last 8760 hours. HbA1C: No results for input(s): HGBA1C in the last 72 hours. CBG: No results for input(s): GLUCAP in the last 168 hours. Lipid Profile: No results for input(s): CHOL, HDL, LDLCALC, TRIG, CHOLHDL, LDLDIRECT in the last 72 hours. Thyroid Function Tests: No results for input(s): TSH, T4TOTAL, FREET4, T3FREE, THYROIDAB in the last 72 hours. Anemia Panel: No results for input(s): VITAMINB12, FOLATE, FERRITIN, TIBC, IRON, RETICCTPCT in  the last 72 hours. Sepsis Labs: No results for input(s): PROCALCITON, LATICACIDVEN in the last 168 hours.  Recent Results (from the past 240 hour(s))  Resp Panel by RT-PCR (Flu A&B, Covid) Nasopharyngeal Swab     Status: None   Collection Time: 06/07/2020  2:44 PM   Specimen: Nasopharyngeal Swab; Nasopharyngeal(NP) swabs in vial transport medium  Result Value Ref Range Status   SARS Coronavirus 2 by RT PCR NEGATIVE NEGATIVE Final    Comment: (NOTE) SARS-CoV-2 target nucleic acids are NOT DETECTED.  The SARS-CoV-2 RNA is generally detectable in upper respiratory specimens during the acute phase of infection. The lowest concentration of SARS-CoV-2 viral copies this assay can detect is 138 copies/mL. A negative result does not preclude SARS-Cov-2 infection and should not be used as the sole basis for treatment or other patient management decisions. A negative result may occur with  improper specimen collection/handling, submission of specimen other than nasopharyngeal swab, presence of viral mutation(s) within the areas targeted by this assay, and inadequate number of viral copies(<138 copies/mL). A negative result must be combined with clinical observations, patient history, and epidemiological information. The expected result is Negative.  Fact Sheet for Patients:  EntrepreneurPulse.com.au  Fact Sheet for Healthcare Providers:  IncredibleEmployment.be  This test is no t yet approved or cleared by the [place] FDA and  has been authorized for detection and/or diagnosis of SARS-CoV-2 by FDA under an Emergency Use Authorization (EUA). This EUA will remain  in effect (meaning this test can be used) for the duration of the COVID-19 declaration under Section 564(b)(1) of the Act, 21 U.S.C.section 360bbb-3(b)(1), unless the authorization is terminated  or revoked sooner.       Influenza A by PCR NEGATIVE NEGATIVE Final   Influenza B by PCR  NEGATIVE NEGATIVE Final    Comment: (NOTE) The Xpert Xpress SARS-CoV-2/FLU/RSV plus assay is intended as an aid in the diagnosis of influenza from Nasopharyngeal swab specimens and should not be used as a sole basis for treatment. Nasal washings and aspirates are unacceptable for Xpert Xpress SARS-CoV-2/FLU/RSV testing.  Fact Sheet for Patients: EntrepreneurPulse.com.au  Fact Sheet for Healthcare Providers: IncredibleEmployment.be  This test is not yet approved or cleared by the [place] FDA and has been authorized for detection and/or diagnosis of SARS-CoV-2 by FDA under an Emergency Use Authorization (EUA). This EUA will remain in effect (meaning this test can be used) for the duration of the COVID-19 declaration under Section 564(b)(1) of the Act, 21 U.S.C. section 360bbb-3(b)(1),  unless the authorization is terminated or revoked.  Performed at [facility], [place] [address] [place], [place] [address]   MRSA PCR Screening     Status: Abnormal   Collection Time: 06/04/2020 11:21 PM   Specimen: Nasal Mucosa; Nasopharyngeal  Result Value Ref Range Status   MRSA by PCR POSITIVE (A) NEGATIVE Final    Comment:        The GeneXpert MRSA Assay (FDA approved for NASAL specimens only), is one component of a comprehensive MRSA colonization surveillance program. It is not intended to diagnose MRSA infection nor to guide or monitor treatment for MRSA infections. CRITICAL RESULT CALLED TO, READ BACK BY AND VERIFIED WITH: RN O SCHIES AT 0157 06/16/20 [place] A Performed at [facility], [place] [address], [place], [place] [address]          Radiology Studies: [facility] [address] Chest Port 1 View  Result Date: 06/17/2020 CLINICAL DATA:  Hypoxia. EXAM: PORTABLE CHEST 1 VIEW COMPARISON:  June 15, 2020 FINDINGS: Enlarged cardiac silhouette. Calcific atherosclerotic disease of the aorta. Low lung volumes with  bilateral interstitial opacities with central predominance. Stable abnormal appearance of the bilateral proximal humeri. IMPRESSION: 1. Enlarged cardiac silhouette. 2. Low lung volumes with bilateral interstitial opacities with central predominance, which likely represent interstitial pulmonary edema. Electronically Signed   By: Fidela Salisbury M.D.   On: 06/17/2020 14:57   DG Swallowing Func-Speech Pathology  Result Date: 06/18/2020 Objective Swallowing Evaluation: Type of Study: MBS-Modified Barium Swallow Study  Patient Details Name: Shaun Yoder MRN: [id number] Date of Birth: 09-Dec-1945 Today's Date: 06/18/2020 Time: SLP Start Time (ACUTE ONLY): 1425 -SLP Stop Time (ACUTE ONLY): 1450 SLP Time Calculation (min) (ACUTE ONLY): 25 min Past Medical History: Past Medical History: Diagnosis Date . Anemia  . Cancer ([place])   bladder . Chronic venous insufficiency  . Colitis  . Colon polyps  . Depression  . Diverticulosis  . DVT of deep femoral vein ([place])  . Gastritis  . GERD (gastroesophageal reflux disease)  . History of rectal polyps  . Hyperlipidemia  . Hypertension  . Iron deficiency  . Low back pain  . Lymphedema of both lower extremities  . OA (osteoarthritis)  . Osteonecrosis of shoulder region ([place])  . Schizoaffective disorder, bipolar type ([place])  . Sleep apnea  . Varicose vein  Past Surgical History: Past Surgical History: Procedure Laterality Date . BLADDER SURGERY   . TOTAL KNEE ARTHROPLASTY Bilateral  . VARICOSE VEIN SURGERY   HPI: JULEZ HUSEBY is a 74 y.o. male with medical history significant of scoliosis, debility, Pressure ulcers, PAD, aortic stenosis, bladder Ca, Colitis, DVT, OSA, schozophrenia, peripheral Neuropathy, rheumatoid arthritis, chronic respiratory failure lower extremity lymphedema, memory loss  Oxygen needs have increased from five to now ten liters over the last several days.  Pt is on hospice prior to admission.  Subjective: Pt somewhat agitated upon arrival to  radiology, but easily redirected Assessment / Plan / Recommendation CHL IP CLINICAL IMPRESSIONS 06/18/2020 Clinical Impression Mr. Dollinger had reluctant participation in modified barium swallow study. Study showed a mild oral and mild-moderate pharyngeal dysphagia. Oral phase is characterized by decreased mastication largely related to poor dentition. He was edentulous for this study, but reports he has upper dentures and is getting lower dentures next week. He also had decreased bolus control/labial seal for thin liquids via tsp, which resolved with use of straw. Pharyngeal phase is characterized by poor timing of swallow initiation/epiglottic inversion, which intermittently results in very trace (  silent) aspiration of thin liquids when pt takes consecutive sips. He is unable to drink from cup given severity of kyphosis, but with single straw sips, pt had no aspiration and only transient penetration. With nectar thick liquids via straw, pt was noted with frank aspiration in moderate amounts and reported, "wow, you've really gotta suck hard on that!" He was sensate to larger amount of aspiration with the nectar thick liquid and had a strong cough, but was ineffectively in clearing penetrant from airway. He had no difficulty with honey thick liquids, but reported, "ew, that's awful. I don't want to do that one again." Pt had no pharyngeal difficulty with purees or soft solids. Esophageal phase could not be visualized given pt positioning. Recommend: thin liquids via straw with cues for small, single sips, diligent oral care TID with toothbrush/toothpaste to gums, tongue, dysphagia 2 solids (finely chopped), meds whole or crushed in pureed, as tolerated SLP Visit Diagnosis Dysphagia, oropharyngeal phase (R13.12) Impact on safety and function Moderate aspiration risk   CHL IP TREATMENT RECOMMENDATION 06/18/2020 Treatment Recommendations Therapy as outlined in treatment plan below   Prognosis 06/18/2020 Prognosis for  Safe Diet Advancement -- Barriers to Reach Goals Cognitive deficits;Severity of deficits;Behavior;Time post onset Barriers/Prognosis Comment -- CHL IP DIET RECOMMENDATION 06/18/2020 SLP Diet Recommendations Dysphagia 2 (Fine chop) solids;Thin liquid Liquid Administration via Straw;Spoon Medication Administration Whole meds with puree Compensations -- Postural Changes Seated upright at 90 degrees   CHL IP OTHER RECOMMENDATIONS 06/18/2020 Recommended Consults -- Oral Care Recommendations Oral care BID Other Recommendations --   CHL IP FOLLOW UP RECOMMENDATIONS 06/18/2020 Follow up Recommendations Skilled Nursing facility   [facility] IP FREQUENCY AND DURATION 11/29/2019 Speech Therapy Frequency (ACUTE ONLY) min 2x/week Treatment Duration 2 weeks      CHL IP ORAL PHASE 06/18/2020 Oral Phase Impaired Oral - Nectar Straw WNL Oral - Thin Teaspoon Left anterior bolus loss;Right anterior bolus loss Oral - Thin Straw  WNL Oral - Puree WNL Oral - Mech Soft Impaired mastication Oral Phase - Comment getting new dentures next week per his report  CHL IP PHARYNGEAL PHASE 06/18/2020 Pharyngeal Phase Impaired Pharyngeal- Honey Straw WNL Pharyngeal- Nectar Straw Penetration/Aspiration before swallow;Moderate aspiration;Reduced airway/laryngeal closure;Reduced epiglottic inversion Pharyngeal Material enters airway, passes BELOW cords and not ejected out despite cough attempt by patient Pharyngeal- Thin Teaspoon WNL Pharyngeal- Thin Cup Not trialed given severity of kyphosis Pharyngeal- Thin Straw Reduced epiglottic inversion;Reduced airway/laryngeal closure;Penetration/Aspiration before swallow;Trace aspiration Pharyngeal Material enters airway, passes BELOW cords without attempt by patient to eject out (silent aspiration);Material enters airway, remains ABOVE vocal cords then ejected out Pharyngeal- Puree WNL Pharyngeal- Mechanical Soft WNL  CHL IP CERVICAL ESOPHAGEAL PHASE 06/18/2020 Cervical Esophageal Phase [facility] [place] P. Isenhour, M.S.,  CCC-SLP Speech-Language Pathologist Acute Rehabilitation Services Pager: [place] 06/18/2020, 3:12 PM              Scheduled Meds: . (feeding supplement) PROSource Plus  30 mL Oral Daily  . apixaban  2.5 mg Oral BID  . aspirin EC  81 mg Oral Daily  . Chlorhexidine Gluconate Cloth  6 each Topical Daily  . divalproex  1,000 mg Oral QHS  . furosemide  60 mg Intravenous BID  . guaiFENesin  600 mg Oral BID  . lurasidone  80 mg Oral QHS  . mouth rinse  15 mL Mouth Rinse BID  . mirabegron ER  25 mg Oral Daily  . mupirocin ointment  1 application Nasal BID  . nutrition supplement (JUVEN)  1  packet Oral BID BM  . oxybutynin  10 mg Oral QHS  . pantoprazole  40 mg Oral Daily  . sodium chloride flush  3 mL Intravenous Q12H   Continuous Infusions: . sodium chloride       LOS: 4 days   Time spent: 49min  Megyn Leng C Geoffrey Mankin, DO [facility]  If 7PM-7AM, please contact night-coverage [email or web address]  06/19/2020, 7:21 AM

## 2020-06-19 NOTE — Progress Notes (Signed)
Chaplain engaged in initial visit with Shaun Yoder and his wife.  Chaplain offered support and prayer with them.  Wife stated that their priest would be coming today as well.  Wife stated also that she was waiting for the doctor to come to have a discussion.  Wife mentioned allowing "God's will to be done"  Chaplain will follow-up.    06/19/20 1200  Clinical Encounter Type  Visited With Patient and family together  Visit Type Initial  Spiritual Encounters  Spiritual Needs Prayer

## 2020-06-19 NOTE — TOC Progression Note (Signed)
Transition of Care [facility]) - Progression Note    Patient Details  Name: Shaun Yoder MRN: [id number] Date of Birth: 22-Mar-1946  Transition of Care [facility]) CM/SW Contact  Leeroy Cha, RN Phone Number: 06/19/2020, 8:38 AM  Clinical Narrative:    PT IS FROM A [facility], ON HFNC AT 9L/MIN, IS A DNR. PLAN IS TO RETURN TO HOSPICE FOLLOWING FOR PROGRESSION.   Expected Discharge Plan: [place]    Expected Discharge Plan and Services Expected Discharge Plan: [place]   Discharge Planning Services: CM Consult   Living arrangements for the past 2 months: Skilled Nursing Facility                                       Social Determinants of Health (SDOH) Interventions    Readmission Risk Interventions No flowsheet data found.

## 2020-06-19 NOTE — Progress Notes (Signed)
Physical Therapy Treatment Patient Details Name: Shaun Yoder MRN: [id number] DOB: [birth year] Today's Date: 06/19/2020    History of Present Illness 74 y.o. male with medical history significant of scoliosis, debility, Pressure ulcers, PAD, aortic stenosis, bladder Ca, Colitis, DVT, OSA, schozophrenia, peripheral Neuropathy, rheumatoid arthritis, chronic respiratory failure lower extremity lymphedema, memory loss. Presented to ED with shortness of breath and found to be hypoxic.    PT Comments    Patient is  Very somnolent. Arouses intermittently, especially when rolling and having legs moved . Patient yells out then is quiet. Patient is on 10 L HFNC, SPO2 remained > 95%, HR 80's. RR 21-31. Patient  Did not assist with rolling for linens to be changed. Continue Mobility as tolerated.   Follow Up Recommendations  SNF     Equipment Recommendations    none   Recommendations for Other Services       Precautions / Restrictions Precautions Precautions: Fall Precaution Comments: wounds on legs, feet, very tender (don't touch if you can help it), on 10 L HFNC    Mobility  Bed Mobility Overal bed mobility: Needs Assistance Bed Mobility: Rolling Rolling: Total assist;+2 for physical assistance;+2 for safety/equipment         General bed mobility comments: total to roll to each side to change bed linens. patient does not assist.Ppatient's arms are limp , does not grasp rail.  Transfers                 General transfer comment: did not attempt sitting, very lethargic.requires a mechanical lift  Ambulation/Gait                 Stairs             Wheelchair Mobility    Modified Rankin (Stroke Patients Only)       Balance                                            Cognition Arousal/Alertness: Lethargic   Overall Cognitive Status: No family/caregiver present to determine baseline cognitive functioning                                  General Comments: Patien is very somnolent, arouses with max stimulation. Stated he is at [employer], "Don't touch my Legs, hold my heels."      Exercises      General Comments General comments (skin integrity, edema, etc.): Patient is sitting upright in bed, very kyphotic posture and head does not rest on pillow, left arm is over bed rail, repositioned back on bed and noted to be low tone.      Pertinent Vitals/Pain Pain Assessment: Faces Faces Pain Scale: Hurts whole lot Pain Location: Bil feet - with touching Pain Descriptors / Indicators: Grimacing;Moaning;Guarding Pain Intervention(s): Limited activity within patient's tolerance;Monitored during session;Repositioned    Home Living                      Prior Function            PT Goals (current goals can now be found in the care plan section) Progress towards PT goals: Not progressing toward goals - comment (lethargic and requires total care)    Frequency    Min 2X/week      PT  Plan Current plan remains appropriate    Co-evaluation              AM-PAC PT "6 Clicks" Mobility   Outcome Measure  Help needed turning from your back to your side while in a flat bed without using bedrails?: Total Help needed moving from lying on your back to sitting on the side of a flat bed without using bedrails?: Total Help needed moving to and from a bed to a chair (including a wheelchair)?: Total Help needed standing up from a chair using your arms (e.g., wheelchair or bedside chair)?: Total Help needed to walk in hospital room?: Total Help needed climbing 3-5 steps with a railing? : Total 6 Click Score: 6    End of Session   Activity Tolerance: Patient limited by lethargy;Patient limited by fatigue;Patient limited by pain Patient left: in bed;with nursing/sitter in room;with bed alarm set Nurse Communication: Mobility status;Need for lift equipment PT Visit Diagnosis: Muscle weakness  (generalized) (M62.81);Pain     Time: 6389-3734 PT Time Calculation (min) (ACUTE ONLY): 18 min  Charges:  $Therapeutic Activity: 8-22 mins                     Tresa Endo PT Acute Rehabilitation Services Pager [phone] Office [phone]    Claretha Cooper 06/19/2020, 9:11 AM

## 2020-06-19 NOTE — Progress Notes (Signed)
Pts cell phone & watch given to pts wife.

## 2020-06-19 NOTE — Progress Notes (Signed)
Wedding band removed and sent with wife.

## 2020-06-21 NOTE — Death Summary Note (Addendum)
Death Summary  FOCH ROSENWALD VWU:[id number] DOB: Jan 15, 1946 DOA: 07-11-20  PCP: Colon Branch, MD  Admit date: July 11, 2020 Date of Death: 2020-07-16  Final Diagnoses:  Active Problems:   Aortic stenosis, severe   Sleep apnea   GERD (gastroesophageal reflux disease)   Schizoaffective disorder, bipolar type (HCC)   Rheumatoid arthritis ([place])   Hyperlipidemia   Hypertension   Left leg DVT (HCC)   Acute on chronic respiratory failure with hypoxia and hypercapnia (HCC)   Acute on chronic diastolic CHF (congestive heart failure) ([place])    1. Aspiration pneumonia/pneumonitis 2. Provoked A. fib in RVR 3. Acute hypoxic respiratory failure   Hospital Course:  Shaun Sailors Scarponiis a 74 y.o.malewith medical history significant of scoliosis, debility, Pressure ulcers, PAD, aortic stenosis, bladder Ca, Colitis, DVT, OSA, schozophrenia, peripheral Neuropathy, rheumatoid arthritis, lower extremity lymphedema, and memory loss. Presented withpatient normally resides at the nursing facility where he is wheelchair-bound. He was visiting his wife for Thanksgiving. Family noted that he was having more shortness of breath and some wheezing he is usually on few liters of oxygen EMS was called he was satting 60% while using his oxygen tank. He was switched to EMS oxygen supply and his oxygenation improved to mid 80s on 2 L he was placed on 4 L going up to the 90s. Was provided albuterol and Atrovent nebulizer. Per wife pt has known hx of severe aortic stenosis but have refused care in the past. He has been more SOB for the past 2 wk requiring O2 at his Nursing home. He has been also more sleepy he typically falls asleep during conversations.Reports wheezing, no fever no chest pain.  Admitted for acute hypoxia in setting of presumed heart failure exacerbation.  Patient continues to slowly worsen over the past few days, lengthy discussion with wife about goals of care.  Given his advancing respiratory  distress, poor p.o. intake in the setting of remarkable dysphagia worsening mental status family has agreed to advanced to hospice/palliative care and comfort measures.  Family had their religious counselor visit for last rights, they will discuss with other family about possible visitation.  At this time given patient's respiratory status requiring high flow oxygen which cannot be sustained at home patient will likely ultimately have an hospital demise.  As expected patient continues to worsen, ultimately died at 09:36 AM 07-16-20   Time: 2956  Signed:  Little Ishikawa DO  [facility] July 16, 2020, 12:51 PM

## 2020-06-21 NOTE — Care Management Important Message (Signed)
Important Message  Patient Details IM Letter given to the Patient. Name: Shaun Yoder MRN: [id number] Date of Birth: 06-02-1946   Medicare Important Message Given:  Yes     Kerin Salen 2020/06/27, 10:23 AM

## 2020-06-21 NOTE — Progress Notes (Signed)
Chaplain engaged in follow-up visit with Elazar's wife and daughter. Wife was tearful at bedside when chaplain arrived, while daughter was outside the room.  Chaplain provided support to Mrs. Klich.  During time together, chaplain further learned about Jerrod and his family.  Mrs. Kant shared how they met when she was working in an [employer].  Taner asked her out at least two times before she told him yes on the third date.  They dated a couple of months before getting engaged and then married within the next year.  She expressed that he was her best friend.  Mrs. Barley described Abbas as being very humorous.  He brought a lot of laughter to their lives.  Daughter, when she arrived back in the room, was able to share that her father loved to eat.  Wife noted that he's [place] and she loved to cook.  Daughter described her father as being very giving and loving to others.  She says he taught her how to be a good person.  Anita would often give out candy as he delivered mail as a mailman.  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  Chaplain also learned that the last ten or so years of Johncarlo's life have been difficult as he has faced various health challenges and has not been able to be the mobile and active man he once was.  Daughter noted that even in the nursing home he would still find ways to give things away to others.   Chaplain offered ministries of presence, listening, and prayer with family.  Chaplain continued to offer support.      06/27/2020 1000  Clinical Encounter Type  Visited With Patient and family together  Visit Type Death;Follow-up  Referral From Nurse;Family  Consult/Referral To Chaplain  Spiritual Encounters  Spiritual Needs Emotional;Grief support;Prayer  Stress Factors  Family Stress Factors Loss

## 2020-06-21 NOTE — Progress Notes (Signed)
Diluadid IV piggy back wasted in waste bin in medication room. 25 CC were wasted. 2nd RN witness was Lenna Sciara, Therapist, sports

## 2020-06-21 DEATH — deceased
